# Patient Record
Sex: Male | Born: 1950 | Race: White | Hispanic: No | Marital: Single | Smoking: Former smoker
Health system: Southern US, Community
[De-identification: ages and names within clinical notes are randomized; demographics above are authoritative.]

## PROBLEM LIST (undated history)

## (undated) DIAGNOSIS — T783XXA Angioneurotic edema, initial encounter: Secondary | ICD-10-CM

## (undated) DIAGNOSIS — IMO0001 Reserved for inherently not codable concepts without codable children: Secondary | ICD-10-CM

## (undated) DIAGNOSIS — N179 Acute kidney failure, unspecified: Secondary | ICD-10-CM

## (undated) DIAGNOSIS — A419 Sepsis, unspecified organism: Secondary | ICD-10-CM

## (undated) DIAGNOSIS — T8859XA Other complications of anesthesia, initial encounter: Secondary | ICD-10-CM

## (undated) DIAGNOSIS — R202 Paresthesia of skin: Secondary | ICD-10-CM

## (undated) DIAGNOSIS — N400 Enlarged prostate without lower urinary tract symptoms: Secondary | ICD-10-CM

## (undated) DIAGNOSIS — Z973 Presence of spectacles and contact lenses: Secondary | ICD-10-CM

## (undated) DIAGNOSIS — M1990 Unspecified osteoarthritis, unspecified site: Secondary | ICD-10-CM

## (undated) DIAGNOSIS — I2699 Other pulmonary embolism without acute cor pulmonale: Secondary | ICD-10-CM

## (undated) DIAGNOSIS — N493 Fournier gangrene: Secondary | ICD-10-CM

## (undated) DIAGNOSIS — G8220 Paraplegia, unspecified: Secondary | ICD-10-CM

## (undated) DIAGNOSIS — E782 Mixed hyperlipidemia: Secondary | ICD-10-CM

## (undated) DIAGNOSIS — I272 Pulmonary hypertension, unspecified: Secondary | ICD-10-CM

## (undated) DIAGNOSIS — R6521 Severe sepsis with septic shock: Secondary | ICD-10-CM

## (undated) DIAGNOSIS — I10 Essential (primary) hypertension: Secondary | ICD-10-CM

## (undated) DIAGNOSIS — M47816 Spondylosis without myelopathy or radiculopathy, lumbar region: Secondary | ICD-10-CM

## (undated) DIAGNOSIS — E119 Type 2 diabetes mellitus without complications: Secondary | ICD-10-CM

## (undated) DIAGNOSIS — J449 Chronic obstructive pulmonary disease, unspecified: Secondary | ICD-10-CM

## (undated) DIAGNOSIS — R200 Anesthesia of skin: Secondary | ICD-10-CM

## (undated) DIAGNOSIS — J069 Acute upper respiratory infection, unspecified: Secondary | ICD-10-CM

## (undated) DIAGNOSIS — I5189 Other ill-defined heart diseases: Secondary | ICD-10-CM

## (undated) DIAGNOSIS — F419 Anxiety disorder, unspecified: Secondary | ICD-10-CM

## (undated) DIAGNOSIS — J9600 Acute respiratory failure, unspecified whether with hypoxia or hypercapnia: Secondary | ICD-10-CM

## (undated) DIAGNOSIS — M51369 Other intervertebral disc degeneration, lumbar region without mention of lumbar back pain or lower extremity pain: Secondary | ICD-10-CM

## (undated) DIAGNOSIS — Z8739 Personal history of other diseases of the musculoskeletal system and connective tissue: Secondary | ICD-10-CM

## (undated) DIAGNOSIS — Z8709 Personal history of other diseases of the respiratory system: Secondary | ICD-10-CM

## (undated) DIAGNOSIS — N50812 Left testicular pain: Secondary | ICD-10-CM

## (undated) DIAGNOSIS — K219 Gastro-esophageal reflux disease without esophagitis: Secondary | ICD-10-CM

## (undated) DIAGNOSIS — T4145XA Adverse effect of unspecified anesthetic, initial encounter: Secondary | ICD-10-CM

## (undated) DIAGNOSIS — M5136 Other intervertebral disc degeneration, lumbar region: Secondary | ICD-10-CM

## (undated) HISTORY — PX: CERVICAL DISC SURGERY: SHX588

## (undated) HISTORY — PX: DENTAL SURGERY: SHX609

## (undated) HISTORY — PX: APPENDECTOMY: SHX54

## (undated) HISTORY — DX: Angioneurotic edema, initial encounter: T78.3XXA

## (undated) HISTORY — PX: BACK SURGERY: SHX140

## (undated) HISTORY — PX: COLONOSCOPY: SHX174

## (undated) HISTORY — PX: TONSILLECTOMY: SUR1361

## (undated) HISTORY — PX: ADENOIDECTOMY: SUR15

## (undated) HISTORY — PX: PLANTAR FASCIA SURGERY: SHX746

## (undated) HISTORY — PX: OTHER SURGICAL HISTORY: SHX169

## (undated) HISTORY — DX: Acute upper respiratory infection, unspecified: J06.9

---

## 1999-12-13 ENCOUNTER — Ambulatory Visit (HOSPITAL_BASED_OUTPATIENT_CLINIC_OR_DEPARTMENT_OTHER): Admission: RE | Admit: 1999-12-13 | Discharge: 1999-12-14 | Payer: Self-pay | Admitting: Orthopedic Surgery

## 2000-10-20 ENCOUNTER — Ambulatory Visit (HOSPITAL_BASED_OUTPATIENT_CLINIC_OR_DEPARTMENT_OTHER): Admission: RE | Admit: 2000-10-20 | Discharge: 2000-10-21 | Payer: Self-pay | Admitting: Orthopedic Surgery

## 2004-08-10 ENCOUNTER — Ambulatory Visit: Payer: Self-pay | Admitting: Internal Medicine

## 2004-09-12 HISTORY — PX: CARDIAC CATHETERIZATION: SHX172

## 2004-11-08 ENCOUNTER — Ambulatory Visit: Payer: Self-pay | Admitting: Internal Medicine

## 2005-05-24 ENCOUNTER — Ambulatory Visit: Payer: Self-pay | Admitting: Cardiology

## 2005-05-31 ENCOUNTER — Ambulatory Visit: Payer: Self-pay | Admitting: Cardiology

## 2005-06-06 ENCOUNTER — Inpatient Hospital Stay (HOSPITAL_BASED_OUTPATIENT_CLINIC_OR_DEPARTMENT_OTHER): Admission: RE | Admit: 2005-06-06 | Discharge: 2005-06-06 | Payer: Self-pay | Admitting: Cardiology

## 2005-06-06 ENCOUNTER — Ambulatory Visit: Payer: Self-pay | Admitting: Cardiology

## 2005-07-12 ENCOUNTER — Ambulatory Visit: Payer: Self-pay | Admitting: Cardiology

## 2009-10-20 ENCOUNTER — Ambulatory Visit (HOSPITAL_COMMUNITY): Admission: RE | Admit: 2009-10-20 | Discharge: 2009-10-20 | Payer: Self-pay | Admitting: Neurosurgery

## 2009-11-10 ENCOUNTER — Inpatient Hospital Stay (HOSPITAL_COMMUNITY): Admission: RE | Admit: 2009-11-10 | Discharge: 2009-11-13 | Payer: Self-pay | Admitting: Neurosurgery

## 2010-02-09 ENCOUNTER — Inpatient Hospital Stay (HOSPITAL_COMMUNITY): Admission: RE | Admit: 2010-02-09 | Discharge: 2010-02-16 | Payer: Self-pay | Admitting: Neurosurgery

## 2010-02-15 ENCOUNTER — Ambulatory Visit: Payer: Self-pay | Admitting: Physical Medicine & Rehabilitation

## 2010-11-29 LAB — CBC
HCT: 43.4 % (ref 39.0–52.0)
HCT: 45.4 % (ref 39.0–52.0)
MCHC: 34.9 g/dL (ref 30.0–36.0)
MCV: 88 fL (ref 78.0–100.0)
Platelets: 245 10*3/uL (ref 150–400)
RDW: 14.4 % (ref 11.5–15.5)
WBC: 11.2 10*3/uL — ABNORMAL HIGH (ref 4.0–10.5)

## 2010-11-29 LAB — COMPREHENSIVE METABOLIC PANEL
ALT: 21 U/L (ref 0–53)
AST: 20 U/L (ref 0–37)
Alkaline Phosphatase: 96 U/L (ref 39–117)
Calcium: 9.4 mg/dL (ref 8.4–10.5)
Chloride: 97 mEq/L (ref 96–112)
Creatinine, Ser: 1.2 mg/dL (ref 0.4–1.5)
Potassium: 3.7 mEq/L (ref 3.5–5.1)
Sodium: 134 mEq/L — ABNORMAL LOW (ref 135–145)
Total Bilirubin: 0.7 mg/dL (ref 0.3–1.2)
Total Protein: 6.9 g/dL (ref 6.0–8.3)

## 2010-11-29 LAB — URINALYSIS, ROUTINE W REFLEX MICROSCOPIC
Bilirubin Urine: NEGATIVE
Hgb urine dipstick: NEGATIVE
Ketones, ur: NEGATIVE mg/dL
Nitrite: NEGATIVE
Protein, ur: NEGATIVE mg/dL

## 2010-11-29 LAB — DIFFERENTIAL
Basophils Absolute: 0.1 10*3/uL (ref 0.0–0.1)
Basophils Relative: 1 % (ref 0–1)
Eosinophils Relative: 1 % (ref 0–5)
Lymphs Abs: 3 10*3/uL (ref 0.7–4.0)
Neutrophils Relative %: 74 % (ref 43–77)

## 2010-11-29 LAB — TYPE AND SCREEN: Antibody Screen: NEGATIVE

## 2010-11-29 LAB — APTT: aPTT: 30 seconds (ref 24–37)

## 2010-12-03 LAB — COMPREHENSIVE METABOLIC PANEL
Alkaline Phosphatase: 94 U/L (ref 39–117)
BUN: 12 mg/dL (ref 6–23)
CO2: 25 mEq/L (ref 19–32)
Calcium: 9.7 mg/dL (ref 8.4–10.5)
Chloride: 95 mEq/L — ABNORMAL LOW (ref 96–112)
Creatinine, Ser: 0.96 mg/dL (ref 0.4–1.5)
Potassium: 3.7 mEq/L (ref 3.5–5.1)
Sodium: 132 mEq/L — ABNORMAL LOW (ref 135–145)
Total Protein: 6.6 g/dL (ref 6.0–8.3)

## 2010-12-03 LAB — URINE MICROSCOPIC-ADD ON

## 2010-12-03 LAB — DIFFERENTIAL
Basophils Relative: 0 % (ref 0–1)
Eosinophils Absolute: 0.2 10*3/uL (ref 0.0–0.7)
Eosinophils Relative: 1 % (ref 0–5)
Eosinophils Relative: 2 % (ref 0–5)
Lymphocytes Relative: 15 % (ref 12–46)
Lymphs Abs: 2.7 10*3/uL (ref 0.7–4.0)
Monocytes Relative: 6 % (ref 3–12)
Neutro Abs: 9.7 10*3/uL — ABNORMAL HIGH (ref 1.7–7.7)
Neutrophils Relative %: 72 % (ref 43–77)

## 2010-12-03 LAB — BASIC METABOLIC PANEL
BUN: 16 mg/dL (ref 6–23)
Calcium: 9.3 mg/dL (ref 8.4–10.5)
Chloride: 96 mEq/L (ref 96–112)
Creatinine, Ser: 0.92 mg/dL (ref 0.4–1.5)
GFR calc Af Amer: >60 mL/min (ref >60)
GFR calc non Af Amer: >60 mL/min (ref >60)
Glucose, Bld: 111 mg/dL — ABNORMAL HIGH (ref 70–99)
Sodium: 132 mEq/L — ABNORMAL LOW (ref 135–145)

## 2010-12-03 LAB — URINALYSIS, ROUTINE W REFLEX MICROSCOPIC
Bilirubin Urine: NEGATIVE
Glucose, UA: NEGATIVE mg/dL
Hgb urine dipstick: NEGATIVE
Nitrite: NEGATIVE
Protein, ur: NEGATIVE mg/dL
Specific Gravity, Urine: 1.019 (ref 1.005–1.030)
pH: 6.5 (ref 5.0–8.0)
pH: 7 (ref 5.0–8.0)

## 2010-12-03 LAB — CBC
HCT: 39.8 % (ref 39.0–52.0)
Hemoglobin: 14 g/dL (ref 13.0–17.0)
Hemoglobin: 14.8 g/dL (ref 13.0–17.0)
MCHC: 34.9 g/dL (ref 30.0–36.0)
MCHC: 35.2 g/dL (ref 30.0–36.0)
Platelets: 247 10*3/uL (ref 150–400)
RDW: 14.4 % (ref 11.5–15.5)
RDW: 14.5 % (ref 11.5–15.5)
RDW: 14.6 % (ref 11.5–15.5)
WBC: 15 10*3/uL — ABNORMAL HIGH (ref 4.0–10.5)

## 2010-12-03 LAB — PROTIME-INR
INR: 0.97 (ref 0.00–1.49)
Prothrombin Time: 12.8 seconds (ref 11.6–15.2)
Prothrombin Time: 13.2 seconds (ref 11.6–15.2)

## 2010-12-06 LAB — CBC
Hemoglobin: 14.2 g/dL (ref 13.0–17.0)
MCHC: 35.1 g/dL (ref 30.0–36.0)
RBC: 4.53 MIL/uL (ref 4.22–5.81)

## 2010-12-06 LAB — GLUCOSE, CAPILLARY: Glucose-Capillary: 160 mg/dL — ABNORMAL HIGH (ref 70–99)

## 2011-01-28 NOTE — Op Note (Signed)
[place]  Patient:    Patrick Aguilar, Patrick Aguilar                        MRN: [id number] Adm. Date:  [id number] Disc. Date: [id number] Attending:  Milly Jakob CC:         Harvie Junior, M.D.                           Operative Report  AGE:  60  DATE OF BIRTH: [birth year]  PREOPERATIVE DIAGNOSIS: Subacromial impingement with possible rotator cuff tear.  POSTOPERATIVE DIAGNOSES: 1. Subacromial impingement with type 2 acromion. 2. Full-thickness rotator cuff tear involving all the supraspinatus and a portion    of the infraspinatus. 3. Posterior labral tear.  PRINCIPAL PROCEDURES: 1. Shoulder arthroscopy with debridement of posterior labral tear and undersurface    rotator cuff tear. 2. Arthroscopic subacromial decompression. 3. Mini open rotator cuff repair.  SURGEON:  Harvie Junior, M.D.  ASSISTANT:  Kerby Less, P.A.  ANESTHESIA:  General.  BRIEF HISTORY: This is a 60 year old male with a long history of having shoulder pain.  He was evaluated in the office and felt to have significant impingement ith a type 2 acromion.  He underwent prolonged conservative care and because of failure of this, returned to the office.  At that time, he was noted to have weakness greater than his initial level of weakness, and was very much concerned at that  point there could be a rotator cuff tear.  The patient was doing poorly and he elt at that point that he would like to undergo surgical evaluation so he is brought to the operating room for this procedure.  DESCRIPTION OF PROCEDURE:  The patient was taken to the operating room and adequate anesthesia was obtained with general anesthesia.  The patient was place supine on the operating table.  He was then placed into the beach-chair position and the right shoulder was then prepped and draped in the usual sterile fashion. Care was taken to pad all bony prominences.  Following this,  routine arthroscopic examination of the shoulder revealed there was an obvious flap tear of the undersurface of the rotator cuff.  This was debrided with a suction shaver.  It was clear at that point there was either high grade partial-thickness tear or full-thickness tear of the majority of the supraspinatus. He was noted at this time to have a posterior labral tear. This was debrided and attention was then turned to the subacromial space.  At this point, anterolateral acromioplasty was performed from a lateral portal ith a 6 bur.  Once this was achieved, attention was turned laterally where there was noted to be full-thickness rotator cuff tear.  The edges of this were debrided ith a suction shaver.  The bur was put in and the tuberosity was burred up. At that  point, the size of the cuff necessitated a mini open rotator cuff repair.  At this point, the arthroscopic cannula was removed from the shoulder.  An incision was made incorporating the lateral portal.  The lateral portal was then opened nd the subcutaneous tissues reflected down to the level of the deltoid muscle. The deltoid muscle was then divided in the middle between the anterior and middle portions of the deltoid and the raphe.  This was taken up onto bone.   The deltoid was then opened and the underlying rotator  cuff tear was identified.  A bursectomy was performed and the tuberosity was then burred as well as the rotator cuff being rongeured.  There was interestingly a tremendous amount of scar tissue on the undersurface of the rotator cuff.  This had to be debrided with a knife.  This ent back to bleeding viable tendon.  At this point, three suture anchors were put in place and the cuff was then tied down.  No tension was noted on the cuff at this point.  The posterior portion extension of the cuff side-to-side repair was undertaken and anteriorly at the biceps groove a repair was undertaken.  At this  point with the sutures in place the arm was put through a full range of motion and there were no problems. The acromioplasty, which had been performed arthroscopically was then evaluated and  felt to be perfect.  At this point, there were some undersurface palpable prominence of the rotator cuff; however, the patient had no preoperative symptoms of distal clavicle problems; and, it was felt this was okay.  The arm having been put through a range of motion had no impingement in this area.  At this point, the area was copiously irrigated and suctioned dry.  The deltoid was allowed to be closed with an 0-Vicryl interrupted suture, the subcutaneous tissue, and with 0-Vicryl and 2-0 Vicryl, and the skin with 3-0 Prolene pull-out suture.  A sterile compressive dressing was then applied and the patient was placed into an arm sling.  He was then taken to the recovery room when he was noted to be in satisfactory condition. Estimated blood loss for the procedure was minimal. DD:  12/13/99 TD:  12/13/99 Job: [id number]

## 2011-01-28 NOTE — Op Note (Signed)
[place]  Patient:    IDRISSA, BEVILLE                        MRN: [id number] Proc. Date: 10/20/00 Adm. Date:  [id number] Attending:  Milly Jakob                           Operative Report  PREOPERATIVE DIAGNOSIS:   Type 2 acromion with impingement and probable rotator cuff tear.  POSTOPERATIVE DIAGNOSES: 1. Rotator cuff tear. 2. Type 2 acromion with anterior and lateral impingement. 3. Distal clavicle impingement of the rotator cuff.  OPERATION: 1. Mini open rotator cuff repair. 2. Arthroscopic subacromial decompression with debridement of rotator    cuff and glenohumeral fray. 3. Partial distal clavicle excision by way of coplaning the clavicle.  SURGEON:  Harvie Junior, M.D.  ASSISTANT:  Currie Paris. Thedore Mins.  ANESTHESIA:  Nonblock with general.  INDICATIONS:  This is a 60 year old male with a long history of having had a right rotator cuff tear.  We ultimately evaluated him and felt that he had a rotator cuff tear.  He was taken to the operating room for fixation of this. He began having similar complaints in his left shoulder and because of continued complaints of pain ultimately came in for evaluation and anesthesia, arthroscopy and subacromial decompression and open rotator cuff repair as needed.  DESCRIPTION OF PROCEDURE:  The patient was taken to the operating room and after the adequate anesthesia was obtained with general, the patient was placed supine on the operating table.  He was then moved into the beach chair position.  All bony prominences were well-padded.  The left shoulder was then prepped and draped in the usual sterile fashion and following this routine arthroscopic examination of the shoulder revealed their was some fraying in the glenohumeral joint, not bad and no significant evidence of arthritis. Attention was turned out to the rotator cuff and from inside the joint it was clear that there was a rotator  cuff tear in the supraspinatus tendon only. At this point, attention was pulled out of the glenohumeral joint and attention turned to the subacromial space where a bursectomy was performed and debridement of the undersurface of the clavicle and acromion, subacromial decompression was performed at this point as well as a coplaning of the distal clavicle.  Attention was then turned to the mini open procedure where an incision was made over the lateral aspect of the shoulder. Subcutaneous tissues were dissected down to the level of the deltoid.  A raphe was entered between the anterior and middle deltoid.  This was taken up onto the acromion and the flaps were raised.  The torn rotator cuff was easily identified at that point.  The bone was roughened at the lateral surface.  The tendon was freshened up.  The tendon was pulled over and two suture anchors were used to anchor the tendon back down.  The two sutures on each anchor, one was tied in a figure-of-eight fashion and the other was tied side-by-side.  At this position, the wound was copiously irrigated and suctioned dry. The deltoid was then closed with a combination of single suture through bone anchoring the deltoid back up to the anterior aspect of the acromion and then a #1 Vicryl running suture to repair the deltoid.  At that point, the skin was closed with a combination of 2-0 Vicryl and a  4-0 Monocryl suture.  Benzoin and Steri-Strips were applied and a sterile compressive dressing.  The patient was placed into an arm sling and was taken to the recovery room where she was noted to be in satisfactory condition.  Estimated blood loss for the suture was negligible.DD:  10/20/00 TD:  10/21/00 Job: [id number]

## 2011-01-28 NOTE — Cardiovascular Report (Signed)
NAME:  Patrick Aguilar, Patrick Aguilar NO.:  [id number]   MEDICAL RECORD NO.:  [id number]          PATIENT TYPE:  OIB   LOCATION:  1963                         FACILITY:  [facility]   PHYSICIAN:  Jonelle Sidle, M.D. LHCDATE OF BIRTH:  [birth year]   DATE OF PROCEDURE:  06/06/2005  DATE OF DISCHARGE:  06/06/2005                              CARDIAC CATHETERIZATION   PRIMARY CARE PHYSICIAN:  Dr. Nena Jordan.   PRIMARY CARDIOLOGIST:  Dr. Willa Rough.   INDICATIONS:  Patrick Aguilar is a 60 year old male with a reported history of  hyperlipidemia, borderline type 2 diabetes mellitus, family history of  cardiovascular disease, chronic lower back pain, and apparent chronic  obstructive pulmonary disease with history of significant tobacco use. He  has had dyspnea on exertion and a recent myocardial perfusion study which  was reported as negative for ischemia. Given persistent symptoms, the  patient is referred for definitive diagnostic coronary angiography to  clearly assess the coronary anatomy and evaluate for any potential  revascularizations strategies. Right heart catheterization was also  performed to exclude the possibility of significant underlying pulmonary  hypertension. Informed consent was obtained and signed.   PROCEDURES PERFORMED:  1.  Left heart catheterization.  2.  Right heart catheterization.  3.  Selective coronary angiography.  4.  Left ventriculography.   ACCESS AND EQUIPMENT:  The area about the right femoral artery and vein was  anesthetized with 1% lidocaine. A 7-French sheath was placed in the right  femoral vein via the modified Seldinger technique. A 4-French sheath was  placed in the right femoral artery via the modified Seldinger technique.  Standard preformed 4-French JL-4 and JR-4 catheters used for selective  coronary angiography and a 4-French angled pigtail catheter was used for  left heart catheterization and left ventriculography. A 7-French  balloon-  tipped flow-directed catheter was used for right heart catheterization and  hemodynamic assessment. All exchanges were made over wire with the exception  the right heart catheter. The patient tolerated the procedure well without  immediate complications.   HEMODYNAMIC RESULTS:  Right atrium mean of 6, right ventricle 26/4,  pulmonary artery 25/11. Pulmonary capillary wedge pressure mean of 9. Left  ventricle 140/15. Aorta 144/72. Cardiac output 4.5 by the Fick method.  Cardiac index at 2.3 by the Fick method. Arterial saturation 94% on room  air. Pulmonary artery saturation 68% on room air.   ANGIOGRAPHIC FINDINGS:  1.  The left main coronary artery is free of significant flow-limiting      coronary atherosclerosis.  2.  The left anterior descending is a medium caliber vessel with three      diagonal branches; the first two of which are the largest and bifurcate      followed by very small third branch. No significant flow-limiting      coronary atherosclerosis is noted within this system.  3.  There is a very large ramus intermedius branch that trifurcates. No      significant flow-limiting coronary atherosclerosis is noted within this      system.  4.  The circumflex coronary artery is medium  in caliber with three very      small obtuse marginal branches and a large right atrial branch. No      significant flow-limiting coronary sclerosis noted within this system.  5.  The right coronary artery is a large dominant vessel with large      posterior descending and posterolateral branches. No significant flow-      limiting coronary atherosclerosis noted within this system. Three right      ventricular marginal branches are noted.   Left ventriculography was performed in the RAO projection. It revealed an  ejection fraction of approximately 50-55% with no significant mitral  regurgitation.   DIAGNOSES:  1.  No obstructive coronary atherosclerosis noted within the major       epicardial vessels as discussed above.  2.  Essentially normal pulmonary artery pressures, normal pulmonary      capillary wedge pressure and normal cardiac output.  3.  Left ventricular ejection fraction approximately 50-55% with no      significant mitral regurgitation and left ventricular end-diastolic      pressure of 15 mmHg.   I reviewed the results with the patient as well as his fiancee. Results were  also forwarded to our [place] office. We will anticipate continued  medical therapy and follow-up with evaluation by Dr. Raul Del as planned. We  will also see the patient back in our office for groin check in  approximately one week.           ______________________________  Jonelle Sidle, M.D. [facility]     SGM/MEDQ  D:  06/06/2005  T:  06/06/2005  Job:  [id number]   cc:   Nena Jordan  [address]  [place]  [place] [address]   Willa Rough, M.D.  [address]  [address]  [place]  [place] [address]

## 2011-02-18 IMAGING — CT CT L SPINE W/O CM
3 of 6 series · 14 of 33 positions shown, 17 images · non-contrast
Comparison: Intraoperative films 02/09/2010.

CLINICAL DATA: Lumbar spondylosis.  Unable to lift left leg.
Severe low back pain.  Left hip and leg pain.  Status post
interbody fusion 02/09/2010.

CT LUMBAR SPINE WITHOUT CONTRAST
TECHNIQUE: Multidetector CT imaging of the lumbar spine was
performed without intravenous contrast administration. Multiplanar
CT image reconstructions were also generated.

[Series 4: 2mm axial soft tissue · axial · 0.43mm/px · z∈[-353,-173]mm · 8 of 112 slices shown, 10 images]
[im 11/112  soft-tissue]
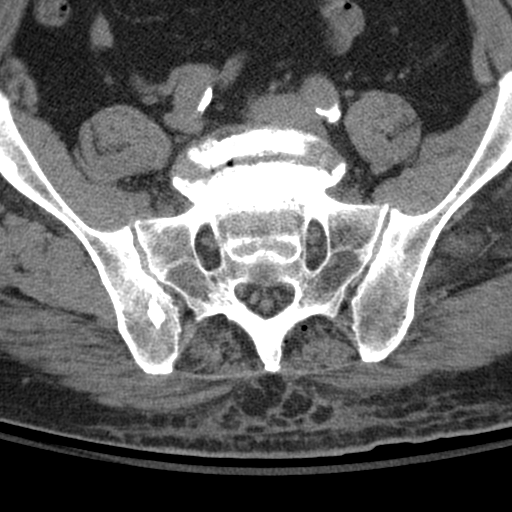
[im 11/112  bone]
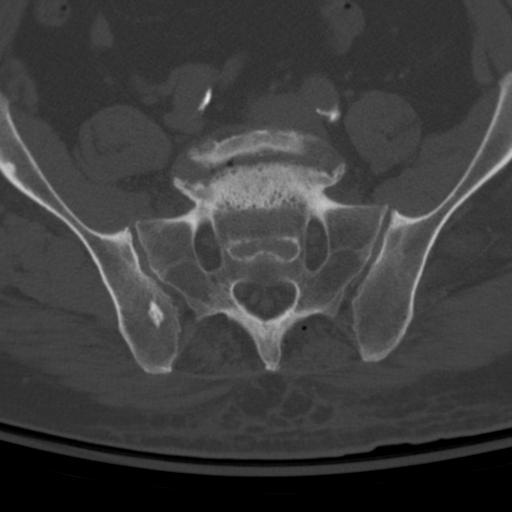
[im 21/112  bone]
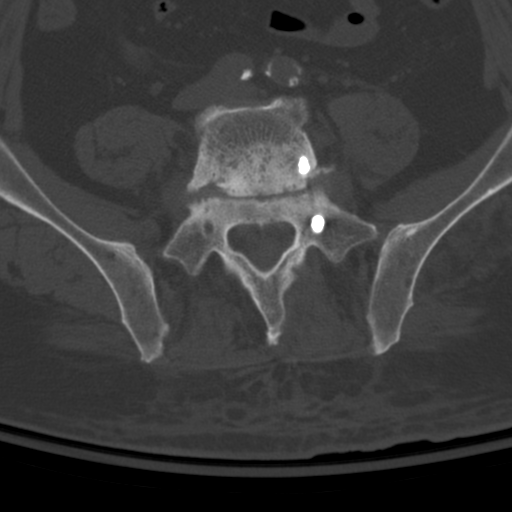
[im 41/112  bone]
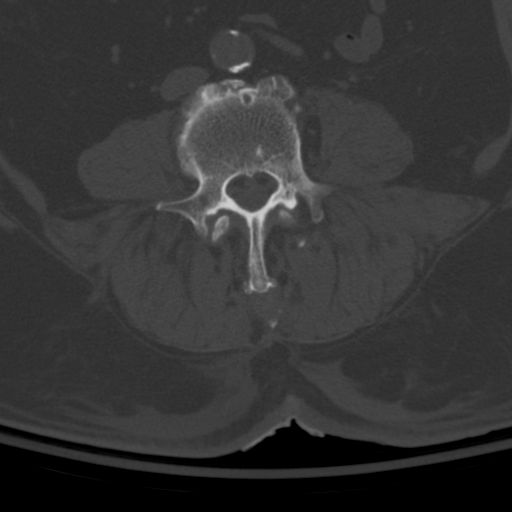
[im 51/112  bone]
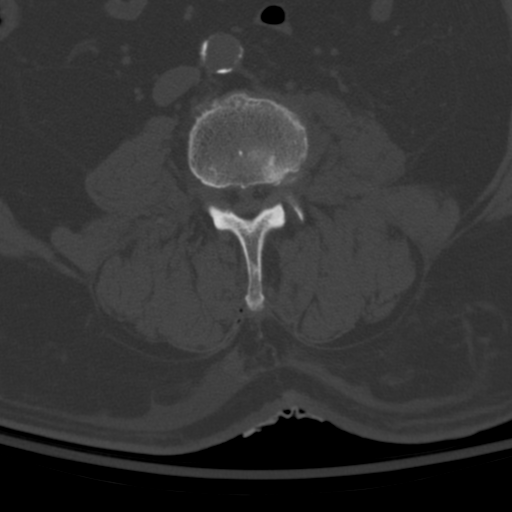
[im 61/112  soft-tissue]
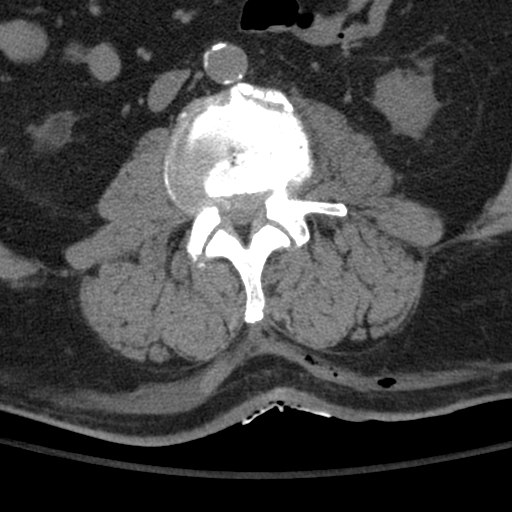
[im 61/112  bone]
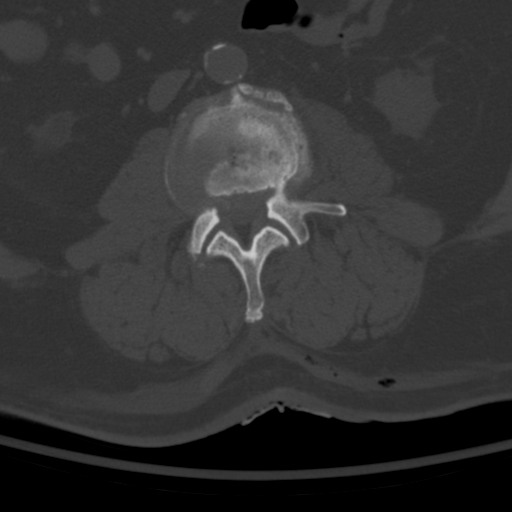
[im 71/112  bone]
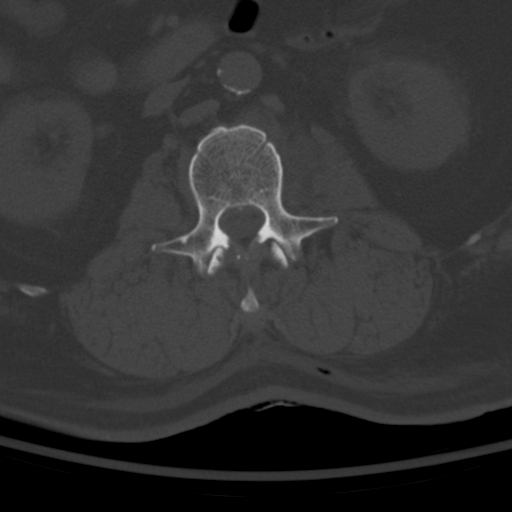
[im 91/112  bone]
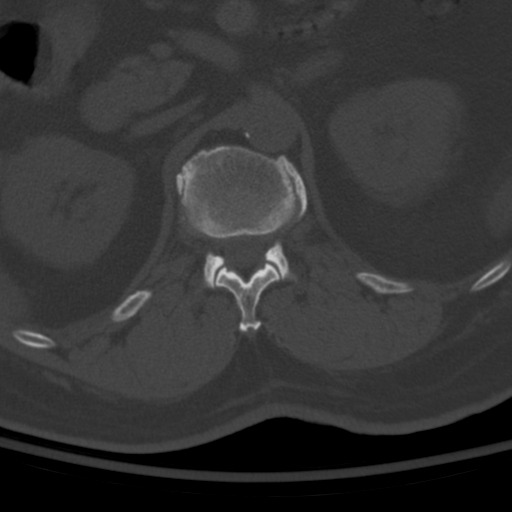
[im 101/112  bone]
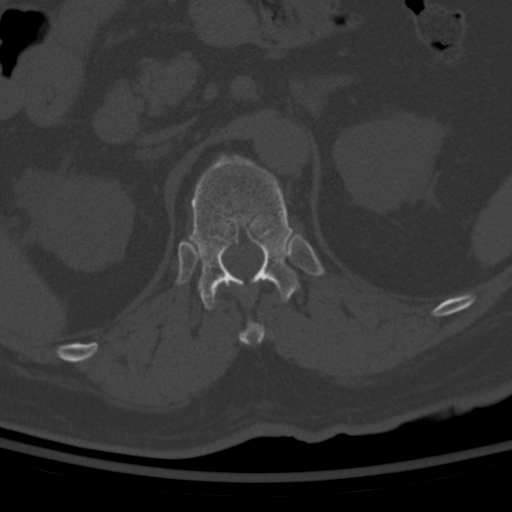

[Series 604: lower bone coronals · coronal · 0.44mm/px · 1 of 66 slices shown]
[im 3/66  bone]
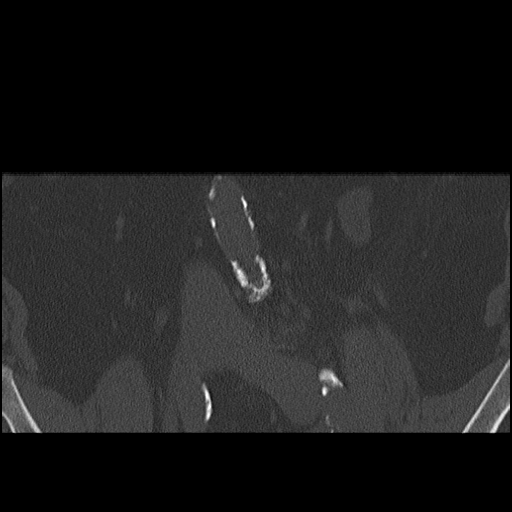

[Series 606: <mpr thick range> · sagittal · 0.44mm/px · 5 of 54 slices shown, 6 images]
[im 18/54  bone]
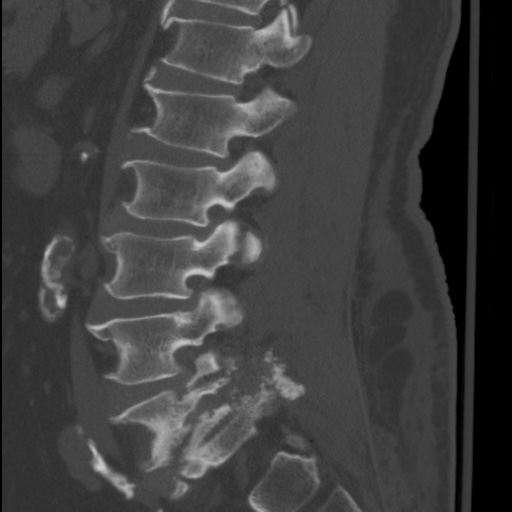
[im 23/54  bone]
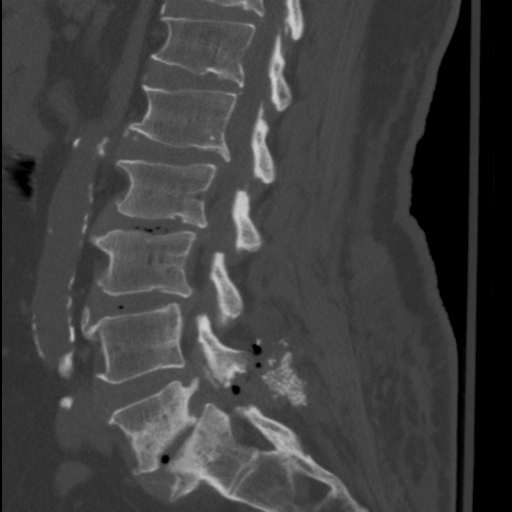
[im 27/54  soft-tissue]
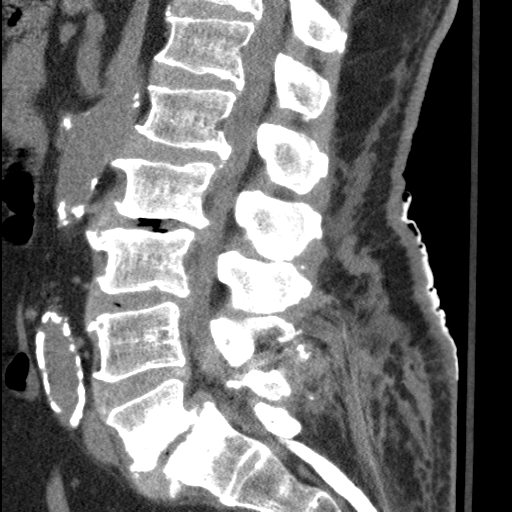
[im 27/54  bone]
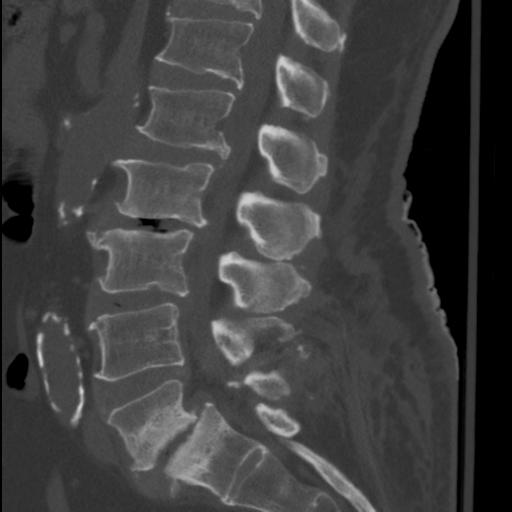
[im 31/54  bone]
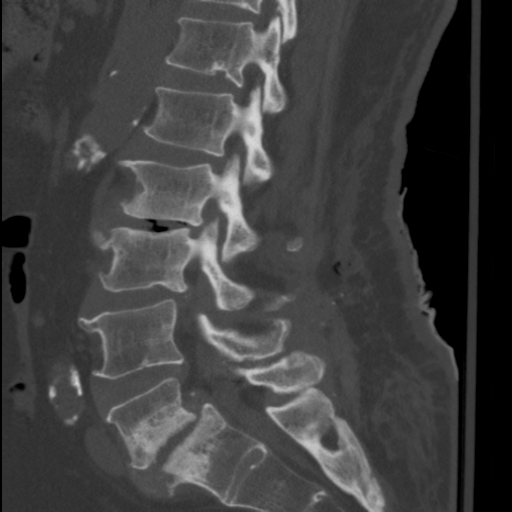
[im 36/54  bone]
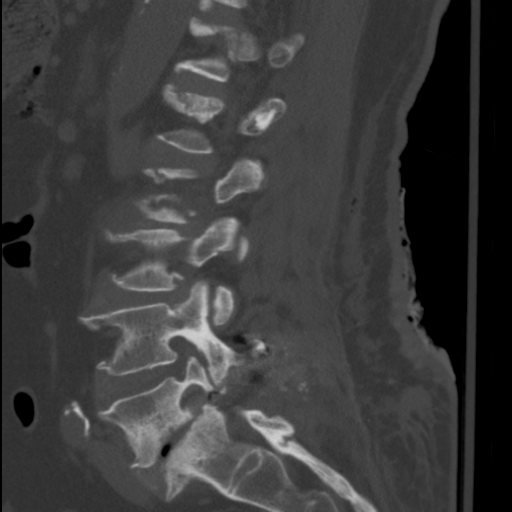

[14 of 33 positions shown; findings below may reference images not displayed]

FINDINGS: The lumbar spine is imaged from the T11-12 disc through
S2.  Mild degenerative retrolisthesis is present at L1-2, L2-3, and
L3-4.  The retrolisthesis at L4-5 measures 3 mm.  Anterolisthesis
at L5-S1 measures 7.5 mm.  Bilateral pars defects are noted.  The
patient is status post posterior fusion.  There are tracks from
screws which were removed on the right.  The left L5 pedicle exits
the bony inferiorly and medially and traverses the left foramen.
This potentially impacts the exiting L5 nerve root.  Bilateral L5
laminotomies were performed.  Graft material is seen posteriorly.

The individual disc levels are as follows.

T12-L1:  Slight left disc bulging is present.  Mild facet
hypertrophy is seen no significant stenosis is evident.

L1-2:  Leftward disc bulging is present.  Posterior osteophyte
formation from the L1 vertebral body is also present.  This leads
to mild moderate central canal stenosis, worse on the left.  Mild
to moderate left and mild right foraminal stenosis is present.

L2-3:  A broad-based disc bulge is present.  Osteophyte formation
is seen along the inferior endplate of L2.  A vacuum phenomenon is
present in the disc.  Moderate central canal stenosis is evident
with left greater than right lateral recess narrowing.  Moderate
foraminal stenosis is seen bilaterally.

L3-4:  A broad-based disc bulge is present.  Moderate facet
hypertrophy is seen.  Moderate central canal stenosis is seen.
Moderate foraminal stenosis is worse on the left.

L4-5:  A broad-based disc bulge is present.  Facet hypertrophy is
worse on the left.  Prominent epidural fat is noted.  Moderate
foraminal stenosis is seen bilaterally, worse on the right.

L5 S1:  Anterolisthesis is as noted above.  Gas within the soft
tissues is compatible with recent surgery.  Prominent epidural fat
is noted.  There is uncovering of the disc.  Moderate foraminal
stenosis due to disc uncovering is worse on the right.
IMPRESSION: 1.  Status post PLIF at L5-S1.
2.  The left L5 pedicle traverses the left L5 S1 foramen and may
impact the left L5 nerve root as it exits.
3.  The right-sided screws were placed and then removed, leaving
tracks within the bone.
4.  Persistent anterolisthesis at L5-S1 with uncovering of the disc
leading to a moderate foraminal stenosis bilaterally.
5.  Multilevel spondylosis throughout the remainder of the lumbar
spine as described.

Critical test results telephoned to Dr. Ashuk at the time of

## 2015-02-26 ENCOUNTER — Other Ambulatory Visit (HOSPITAL_COMMUNITY): Payer: Self-pay | Admitting: Otolaryngology

## 2015-03-04 ENCOUNTER — Encounter (HOSPITAL_COMMUNITY)
Admission: RE | Admit: 2015-03-04 | Discharge: 2015-03-04 | Disposition: A | Discharge status: Home / Self Care | Payer: Medicare Other | Source: Ambulatory Visit | Attending: Otolaryngology | Admitting: Otolaryngology

## 2015-03-04 ENCOUNTER — Encounter (HOSPITAL_COMMUNITY): Payer: Self-pay

## 2015-03-04 DIAGNOSIS — K115 Sialolithiasis: Secondary | ICD-10-CM | POA: Diagnosis not present

## 2015-03-04 DIAGNOSIS — Z01812 Encounter for preprocedural laboratory examination: Secondary | ICD-10-CM | POA: Diagnosis present

## 2015-03-04 DIAGNOSIS — I4510 Unspecified right bundle-branch block: Secondary | ICD-10-CM | POA: Insufficient documentation

## 2015-03-04 HISTORY — DX: Unspecified osteoarthritis, unspecified site: M19.90

## 2015-03-04 HISTORY — DX: Gastro-esophageal reflux disease without esophagitis: K21.9

## 2015-03-04 HISTORY — DX: Chronic obstructive pulmonary disease, unspecified: J44.9

## 2015-03-04 HISTORY — DX: Anxiety disorder, unspecified: F41.9

## 2015-03-04 HISTORY — DX: Reserved for inherently not codable concepts without codable children: IMO0001

## 2015-03-04 LAB — CBC
HEMATOCRIT: 39.5 % (ref 39.0–52.0)
Hemoglobin: 13.7 g/dL (ref 13.0–17.0)
MCH: 30 pg (ref 26.0–34.0)
MCHC: 34.7 g/dL (ref 30.0–36.0)
MCV: 86.6 fL (ref 78.0–100.0)
PLATELETS: 198 10*3/uL (ref 150–400)
RBC: 4.56 MIL/uL (ref 4.22–5.81)
RDW: 14.4 % (ref 11.5–15.5)
WBC: 9.2 10*3/uL (ref 4.0–10.5)

## 2015-03-04 LAB — BASIC METABOLIC PANEL
ANION GAP: 9 (ref 5–15)
BUN: 15 mg/dL (ref 6–20)
CO2: 27 mmol/L (ref 22–32)
CREATININE: 1.11 mg/dL (ref 0.61–1.24)
Calcium: 9.2 mg/dL (ref 8.9–10.3)
Chloride: 103 mmol/L (ref 101–111)
GFR calc Af Amer: >60 mL/min (ref >60)
GFR calc non Af Amer: >60 mL/min (ref >60)
Glucose, Bld: 91 mg/dL (ref 65–99)
Potassium: 3.4 mmol/L — ABNORMAL LOW (ref 3.5–5.1)
Sodium: 139 mmol/L (ref 135–145)

## 2015-03-04 NOTE — Pre-Procedure Instructions (Addendum)
Patrick Aguilar [facility]  03/04/2015     No Pharmacies Listed   Your procedure is scheduled on 03/12/15  Report to [facility] short stay admitting at 930 A.M.  Call this number if you have problems the morning of surgery:  [phone]   Remember:  Do not eat food or drink liquids after midnight.  Take these medicines the morning of surgery with A SIP OF WATER amlodipine, atenolol, prozac, ranitadine,dosazosin,gabapentin  Pain med if needed      STOP all herbel meds, nsaids (aleve,naproxen,advil,ibuprofen) 5 days prior to surgery starting 03/07/15 including vitamins, aspirin,meloxicam   Do not wear jewelry, make-up or nail polish.  Do not wear lotions, powders, or perfumes.  You may wear deodorant.  Do not shave 48 hours prior to surgery.  Men may shave face and neck.  Do not bring valuables to the hospital.  [facility] is not responsible for any belongings or valuables.  Contacts, dentures or bridgework may not be worn into surgery.  Leave your suitcase in the car.  After surgery it may be brought to your room.  For patients admitted to the hospital, discharge time will be determined by your treatment team.  Patients discharged the day of surgery will not be allowed to drive home.   Name and phone number of your driver:    Special instructions:   Special Instructions: [place] - Preparing for Surgery  Before surgery, you can play an important role.  Because skin is not sterile, your skin needs to be as free of germs as possible.  You can reduce the number of germs on you skin by washing with CHG (chlorahexidine gluconate) soap before surgery.  CHG is an antiseptic cleaner which kills germs and bonds with the skin to continue killing germs even after washing.  Please DO NOT use if you have an allergy to CHG or antibacterial soaps.  If your skin becomes reddened/irritated stop using the CHG and inform your nurse when you arrive at Short Stay.  Do not shave (including legs and  underarms) for at least 48 hours prior to the first CHG shower.  You may shave your face.  Please follow these instructions carefully:   1.  Shower with CHG Soap the night before surgery and the morning of Surgery.  2.  If you choose to wash your hair, wash your hair first as usual with your normal shampoo.  3.  After you shampoo, rinse your hair and body thoroughly to remove the Shampoo.  4.  Use CHG as you would any other liquid soap.  You can apply chg directly  to the skin and wash gently with scrungie or a clean washcloth.  5.  Apply the CHG Soap to your body ONLY FROM THE NECK DOWN.  Do not use on open wounds or open sores.  Avoid contact with your eyes ears, mouth and genitals (private parts).  Wash genitals (private parts)       with your normal soap.  6.  Wash thoroughly, paying special attention to the area where your surgery will be performed.  7.  Thoroughly rinse your body with warm water from the neck down.  8.  DO NOT shower/wash with your normal soap after using and rinsing off the CHG Soap.  9.  Pat yourself dry with a clean towel.            10.  Wear clean pajamas.            11.  Place clean sheets on your bed the night of your first shower and do not sleep with pets.  Day of Surgery  Do not apply any lotions/deodorants the morning of surgery.  Please wear clean clothes to the hospital/surgery center.  Please read over the following fact sheets that you were given. Pain Booklet, Coughing and Deep Breathing and Surgical Site Infection Prevention

## 2015-03-12 ENCOUNTER — Encounter (HOSPITAL_COMMUNITY): Payer: Self-pay | Admitting: *Deleted

## 2015-03-12 ENCOUNTER — Ambulatory Visit (HOSPITAL_COMMUNITY): Payer: Medicare Other | Admitting: Anesthesiology

## 2015-03-12 ENCOUNTER — Encounter (HOSPITAL_COMMUNITY)
Admission: RE | Disposition: A | Discharge status: Home / Self Care | Payer: Self-pay | Source: Ambulatory Visit | Attending: Otolaryngology

## 2015-03-12 ENCOUNTER — Observation Stay (HOSPITAL_COMMUNITY)
Admission: RE | Admit: 2015-03-12 | Discharge: 2015-03-13 | Disposition: A | Discharge status: Home / Self Care | Payer: Medicare Other | Source: Ambulatory Visit | Attending: Otolaryngology | Admitting: Otolaryngology

## 2015-03-12 DIAGNOSIS — K219 Gastro-esophageal reflux disease without esophagitis: Secondary | ICD-10-CM | POA: Insufficient documentation

## 2015-03-12 DIAGNOSIS — Z79899 Other long term (current) drug therapy: Secondary | ICD-10-CM | POA: Diagnosis not present

## 2015-03-12 DIAGNOSIS — M1990 Unspecified osteoarthritis, unspecified site: Secondary | ICD-10-CM | POA: Diagnosis not present

## 2015-03-12 DIAGNOSIS — K1120 Sialoadenitis, unspecified: Secondary | ICD-10-CM | POA: Diagnosis not present

## 2015-03-12 DIAGNOSIS — J449 Chronic obstructive pulmonary disease, unspecified: Secondary | ICD-10-CM | POA: Insufficient documentation

## 2015-03-12 DIAGNOSIS — Z885 Allergy status to narcotic agent status: Secondary | ICD-10-CM | POA: Insufficient documentation

## 2015-03-12 DIAGNOSIS — K115 Sialolithiasis: Principal | ICD-10-CM | POA: Diagnosis present

## 2015-03-12 DIAGNOSIS — F17210 Nicotine dependence, cigarettes, uncomplicated: Secondary | ICD-10-CM | POA: Insufficient documentation

## 2015-03-12 DIAGNOSIS — F419 Anxiety disorder, unspecified: Secondary | ICD-10-CM | POA: Insufficient documentation

## 2015-03-12 HISTORY — PX: SUBMANDIBULAR GLAND EXCISION: SHX2456

## 2015-03-12 SURGERY — EXCISION, SUBMANDIBULAR GLAND
Anesthesia: General | Site: Neck | Laterality: Left

## 2015-03-12 MED ORDER — ALBUTEROL SULFATE (2.5 MG/3ML) 0.083% IN NEBU: 2.5000 mg | INHALATION_SOLUTION | Freq: Four times a day (QID) | RESPIRATORY_TRACT | Status: DC | PRN

## 2015-03-12 MED ORDER — ATENOLOL-CHLORTHALIDONE 50-25 MG PO TABS
1.0000 | ORAL_TABLET | Freq: Every day | ORAL | Status: DC
  Filled 2015-03-12: qty 1

## 2015-03-12 MED ORDER — EPHEDRINE SULFATE 50 MG/ML IJ SOLN
INTRAMUSCULAR | Status: AC
  Filled 2015-03-12: qty 1

## 2015-03-12 MED ORDER — CEFAZOLIN SODIUM 1-5 GM-% IV SOLN
1.0000 g | Freq: Three times a day (TID) | INTRAVENOUS | Status: DC
  Administered 2015-03-12 – 2015-03-13 (×2): 1 g via INTRAVENOUS
  Filled 2015-03-12 (×4): qty 50

## 2015-03-12 MED ORDER — FENTANYL CITRATE (PF) 250 MCG/5ML IJ SOLN
INTRAMUSCULAR | Status: AC
  Filled 2015-03-12: qty 5

## 2015-03-12 MED ORDER — ACETAMINOPHEN 325 MG PO TABS: 650.0000 mg | ORAL_TABLET | ORAL | Status: DC | PRN

## 2015-03-12 MED ORDER — KCL IN DEXTROSE-NACL 20-5-0.45 MEQ/L-%-% IV SOLN
INTRAVENOUS | Status: DC
Start: 2015-03-12 — End: 2015-03-13
  Administered 2015-03-12 – 2015-03-13 (×2): via INTRAVENOUS
  Filled 2015-03-12 (×2): qty 1000

## 2015-03-12 MED ORDER — SUCCINYLCHOLINE CHLORIDE 20 MG/ML IJ SOLN
INTRAMUSCULAR | Status: DC | PRN
  Administered 2015-03-12: 100 mg via INTRAVENOUS

## 2015-03-12 MED ORDER — LACTATED RINGERS IV SOLN
INTRAVENOUS | Status: DC
  Administered 2015-03-12 (×3): via INTRAVENOUS

## 2015-03-12 MED ORDER — POTASSIUM CHLORIDE CRYS ER 20 MEQ PO TBCR
40.0000 meq | EXTENDED_RELEASE_TABLET | Freq: Every day | ORAL | Status: DC
  Administered 2015-03-13: 40 meq via ORAL
  Filled 2015-03-12: qty 2

## 2015-03-12 MED ORDER — BENAZEPRIL HCL 20 MG PO TABS
20.0000 mg | ORAL_TABLET | Freq: Every day | ORAL | Status: DC
  Administered 2015-03-13: 20 mg via ORAL
  Filled 2015-03-12 (×2): qty 1

## 2015-03-12 MED ORDER — GABAPENTIN 400 MG PO CAPS
800.0000 mg | ORAL_CAPSULE | Freq: Three times a day (TID) | ORAL | Status: DC
  Administered 2015-03-12 – 2015-03-13 (×2): 800 mg via ORAL
  Filled 2015-03-12 (×2): qty 2

## 2015-03-12 MED ORDER — PROMETHAZINE HCL 25 MG RE SUPP: 12.5000 mg | Freq: Four times a day (QID) | RECTAL | Status: DC | PRN

## 2015-03-12 MED ORDER — GABAPENTIN 800 MG PO TABS
800.0000 mg | ORAL_TABLET | Freq: Three times a day (TID) | ORAL | Status: DC
  Filled 2015-03-12 (×2): qty 1

## 2015-03-12 MED ORDER — ARTIFICIAL TEARS OP OINT
TOPICAL_OINTMENT | OPHTHALMIC | Status: AC
  Filled 2015-03-12: qty 3.5

## 2015-03-12 MED ORDER — HYDROCODONE-ACETAMINOPHEN 5-325 MG PO TABS
1.0000 | ORAL_TABLET | ORAL | Status: DC | PRN
  Administered 2015-03-12: 1 via ORAL
  Filled 2015-03-12: qty 2

## 2015-03-12 MED ORDER — CEFAZOLIN SODIUM-DEXTROSE 2-3 GM-% IV SOLR
INTRAVENOUS | Status: DC | PRN
  Administered 2015-03-12: 2 g via INTRAVENOUS

## 2015-03-12 MED ORDER — DOXAZOSIN MESYLATE 8 MG PO TABS
8.0000 mg | ORAL_TABLET | Freq: Every day | ORAL | Status: DC
  Administered 2015-03-13: 8 mg via ORAL
  Filled 2015-03-12 (×2): qty 1

## 2015-03-12 MED ORDER — ROCURONIUM BROMIDE 50 MG/5ML IV SOLN
INTRAVENOUS | Status: AC
  Filled 2015-03-12: qty 1

## 2015-03-12 MED ORDER — FAMOTIDINE 20 MG PO TABS
20.0000 mg | ORAL_TABLET | Freq: Two times a day (BID) | ORAL | Status: DC
  Administered 2015-03-12 – 2015-03-13 (×2): 20 mg via ORAL
  Filled 2015-03-12 (×2): qty 1

## 2015-03-12 MED ORDER — 0.9 % SODIUM CHLORIDE (POUR BTL) OPTIME
TOPICAL | Status: DC | PRN
  Administered 2015-03-12: 1000 mL

## 2015-03-12 MED ORDER — FENTANYL CITRATE (PF) 100 MCG/2ML IJ SOLN
INTRAMUSCULAR | Status: DC | PRN
  Administered 2015-03-12 (×5): 50 ug via INTRAVENOUS

## 2015-03-12 MED ORDER — ARTIFICIAL TEARS OP OINT
TOPICAL_OINTMENT | OPHTHALMIC | Status: DC | PRN
  Administered 2015-03-12: 1 via OPHTHALMIC

## 2015-03-12 MED ORDER — GLYCOPYRROLATE 0.2 MG/ML IJ SOLN
INTRAMUSCULAR | Status: AC
  Filled 2015-03-12: qty 1

## 2015-03-12 MED ORDER — ONDANSETRON HCL 4 MG/2ML IJ SOLN
INTRAMUSCULAR | Status: AC
  Filled 2015-03-12: qty 2

## 2015-03-12 MED ORDER — HYDROMORPHONE HCL 1 MG/ML IJ SOLN
0.2500 mg | INTRAMUSCULAR | Status: DC | PRN
  Administered 2015-03-12: 0.5 mg via INTRAVENOUS

## 2015-03-12 MED ORDER — BACITRACIN ZINC 500 UNIT/GM EX OINT
TOPICAL_OINTMENT | CUTANEOUS | Status: AC
  Filled 2015-03-12: qty 28.35

## 2015-03-12 MED ORDER — SIMVASTATIN 20 MG PO TABS
20.0000 mg | ORAL_TABLET | Freq: Every day | ORAL | Status: DC
  Administered 2015-03-13: 20 mg via ORAL
  Filled 2015-03-12: qty 1

## 2015-03-12 MED ORDER — AMLODIPINE BESYLATE 5 MG PO TABS
5.0000 mg | ORAL_TABLET | Freq: Every day | ORAL | Status: DC
  Administered 2015-03-13: 5 mg via ORAL
  Filled 2015-03-12: qty 1

## 2015-03-12 MED ORDER — LIDOCAINE-EPINEPHRINE 1 %-1:100000 IJ SOLN
INTRAMUSCULAR | Status: DC | PRN
  Administered 2015-03-12: 3.5 mL

## 2015-03-12 MED ORDER — MELOXICAM 7.5 MG PO TABS
7.5000 mg | ORAL_TABLET | Freq: Two times a day (BID) | ORAL | Status: DC
Start: 2015-03-12 — End: 2015-03-13
  Administered 2015-03-12 – 2015-03-13 (×2): 7.5 mg via ORAL
  Filled 2015-03-12 (×3): qty 1

## 2015-03-12 MED ORDER — MIDAZOLAM HCL 2 MG/2ML IJ SOLN
INTRAMUSCULAR | Status: AC
  Filled 2015-03-12: qty 2

## 2015-03-12 MED ORDER — SUCCINYLCHOLINE CHLORIDE 20 MG/ML IJ SOLN
INTRAMUSCULAR | Status: AC
  Filled 2015-03-12: qty 1

## 2015-03-12 MED ORDER — LIDOCAINE HCL (CARDIAC) 20 MG/ML IV SOLN
INTRAVENOUS | Status: DC | PRN
  Administered 2015-03-12: 80 mg via INTRAVENOUS

## 2015-03-12 MED ORDER — PROMETHAZINE HCL 25 MG/ML IJ SOLN
6.2500 mg | INTRAMUSCULAR | Status: DC | PRN
Start: 2015-03-12 — End: 2015-03-12

## 2015-03-12 MED ORDER — PROPOFOL 10 MG/ML IV BOLUS
INTRAVENOUS | Status: AC
  Filled 2015-03-12: qty 20

## 2015-03-12 MED ORDER — ONDANSETRON HCL 4 MG/2ML IJ SOLN
INTRAMUSCULAR | Status: DC | PRN
  Administered 2015-03-12: 4 mg via INTRAVENOUS

## 2015-03-12 MED ORDER — HYDROMORPHONE HCL 1 MG/ML IJ SOLN
INTRAMUSCULAR | Status: AC
  Administered 2015-03-12: 0.5 mg via INTRAVENOUS
  Filled 2015-03-12: qty 1

## 2015-03-12 MED ORDER — PHENOL 1.4 % MT LIQD: 2.0000 | Freq: Three times a day (TID) | OROMUCOSAL | Status: DC | PRN

## 2015-03-12 MED ORDER — DIPHENHYDRAMINE HCL 50 MG/ML IJ SOLN: 25.0000 mg | Freq: Four times a day (QID) | INTRAMUSCULAR | Status: DC | PRN

## 2015-03-12 MED ORDER — EPHEDRINE SULFATE 50 MG/ML IJ SOLN
INTRAMUSCULAR | Status: DC | PRN
  Administered 2015-03-12 (×2): 10 mg via INTRAVENOUS

## 2015-03-12 MED ORDER — LIDOCAINE-EPINEPHRINE 1 %-1:100000 IJ SOLN
INTRAMUSCULAR | Status: AC
  Filled 2015-03-12: qty 1

## 2015-03-12 MED ORDER — FLUOXETINE HCL 20 MG PO CAPS
20.0000 mg | ORAL_CAPSULE | Freq: Every day | ORAL | Status: DC
  Administered 2015-03-13: 20 mg via ORAL
  Filled 2015-03-12: qty 1

## 2015-03-12 MED ORDER — MIDAZOLAM HCL 5 MG/5ML IJ SOLN
INTRAMUSCULAR | Status: DC | PRN
  Administered 2015-03-12: 1 mg via INTRAVENOUS

## 2015-03-12 MED ORDER — PROMETHAZINE HCL 25 MG PO TABS: 12.5000 mg | ORAL_TABLET | Freq: Four times a day (QID) | ORAL | Status: DC | PRN

## 2015-03-12 MED ORDER — SODIUM CHLORIDE 0.9 % IJ SOLN
INTRAMUSCULAR | Status: AC
  Filled 2015-03-12: qty 10

## 2015-03-12 MED ORDER — LIDOCAINE HCL (CARDIAC) 20 MG/ML IV SOLN
INTRAVENOUS | Status: AC
  Filled 2015-03-12: qty 5

## 2015-03-12 MED ORDER — PROPOFOL 10 MG/ML IV BOLUS
INTRAVENOUS | Status: DC | PRN
  Administered 2015-03-12: 200 mg via INTRAVENOUS

## 2015-03-12 MED ORDER — DIAZEPAM 5 MG PO TABS
10.0000 mg | ORAL_TABLET | Freq: Two times a day (BID) | ORAL | Status: DC
  Administered 2015-03-12 – 2015-03-13 (×2): 10 mg via ORAL
  Filled 2015-03-12 (×2): qty 2

## 2015-03-12 MED ORDER — ONDANSETRON HCL 4 MG/2ML IJ SOLN
4.0000 mg | Freq: Four times a day (QID) | INTRAMUSCULAR | Status: DC | PRN
Start: 2015-03-12 — End: 2015-03-13

## 2015-03-12 SURGICAL SUPPLY — 61 items
ADH SKN CLS APL DERMABOND .7 (GAUZE/BANDAGES/DRESSINGS) ×1
APL SKNCLS STERI-STRIP NONHPOA (GAUZE/BANDAGES/DRESSINGS) ×1
ATTRACTOMAT 16X20 MAGNETIC DRP (DRAPES) IMPLANT
BENZOIN TINCTURE PRP APPL 2/3 (GAUZE/BANDAGES/DRESSINGS) ×2 IMPLANT
BLADE SURG 15 STRL LF DISP TIS (BLADE) IMPLANT
BLADE SURG 15 STRL SS (BLADE)
CANISTER SUCTION 2500CC (MISCELLANEOUS) ×3 IMPLANT
CLEANER TIP ELECTROSURG 2X2 (MISCELLANEOUS) ×3 IMPLANT
CONT SPEC 4OZ CLIKSEAL STRL BL (MISCELLANEOUS) ×3 IMPLANT
CORDS BIPOLAR (ELECTRODE) ×5 IMPLANT
COVER SURGICAL LIGHT HANDLE (MISCELLANEOUS) ×3 IMPLANT
DECANTER SPIKE VIAL GLASS SM (MISCELLANEOUS) ×3 IMPLANT
DERMABOND ADVANCED (GAUZE/BANDAGES/DRESSINGS) ×2
DERMABOND ADVANCED .7 DNX12 (GAUZE/BANDAGES/DRESSINGS) ×1 IMPLANT
DRAIN CHANNEL 7F 3/4 FLAT (WOUND CARE) ×2 IMPLANT
DRAIN PENROSE 1/4X12 LTX STRL (WOUND CARE) IMPLANT
DRAIN SNY 10 ROU (WOUND CARE) IMPLANT
DRAPE PROXIMA HALF (DRAPES) IMPLANT
DRAPE SURG 17X23 STRL (DRAPES) IMPLANT
ELECT COATED BLADE 2.86 ST (ELECTRODE) ×3 IMPLANT
ELECT REM PT RETURN 9FT ADLT (ELECTROSURGICAL) ×3
ELECTRODE REM PT RTRN 9FT ADLT (ELECTROSURGICAL) ×1 IMPLANT
EVACUATOR SILICONE 100CC (DRAIN) ×2 IMPLANT
GAUZE SPONGE 4X4 16PLY XRAY LF (GAUZE/BANDAGES/DRESSINGS) ×2 IMPLANT
GLOVE BIO SURGEON STRL SZ 6.5 (GLOVE) ×2 IMPLANT
GLOVE BIO SURGEON STRL SZ7.5 (GLOVE) ×2 IMPLANT
GLOVE BIO SURGEONS STRL SZ 6.5 (GLOVE) ×2
GLOVE BIOGEL PI IND STRL 6.5 (GLOVE) IMPLANT
GLOVE BIOGEL PI IND STRL 7.5 (GLOVE) IMPLANT
GLOVE BIOGEL PI INDICATOR 6.5 (GLOVE) ×2
GLOVE BIOGEL PI INDICATOR 7.5 (GLOVE) ×2
GLOVE ECLIPSE 7.5 STRL STRAW (GLOVE) ×3 IMPLANT
GLOVE SURG SS PI 7.0 STRL IVOR (GLOVE) ×2 IMPLANT
GOWN STRL REUS W/ TWL LRG LVL3 (GOWN DISPOSABLE) ×2 IMPLANT
GOWN STRL REUS W/TWL LRG LVL3 (GOWN DISPOSABLE) ×9
KIT BASIN OR (CUSTOM PROCEDURE TRAY) ×3 IMPLANT
KIT ROOM TURNOVER OR (KITS) ×3 IMPLANT
LOCATOR NERVE 3 VOLT (DISPOSABLE) IMPLANT
NDL HYPO 25GX1X1/2 BEV (NEEDLE) IMPLANT
NEEDLE HYPO 25GX1X1/2 BEV (NEEDLE) ×3 IMPLANT
NS IRRIG 1000ML POUR BTL (IV SOLUTION) ×3 IMPLANT
PAD ARMBOARD 7.5X6 YLW CONV (MISCELLANEOUS) ×6 IMPLANT
PENCIL BUTTON HOLSTER BLD 10FT (ELECTRODE) ×3 IMPLANT
PROBE NERVBE PRASS .33 (MISCELLANEOUS) IMPLANT
SOL PREP POV-IOD 4OZ 10% (MISCELLANEOUS) ×2 IMPLANT
SPECIMEN JAR SMALL (MISCELLANEOUS) IMPLANT
STAPLER VISISTAT 35W (STAPLE) ×3 IMPLANT
SUT CHROMIC 3 0 PS 2 (SUTURE) IMPLANT
SUT ETHILON 2 0 FS 18 (SUTURE) ×2 IMPLANT
SUT ETHILON 3 0 PS 1 (SUTURE) IMPLANT
SUT ETHILON 5 0 P 3 18 (SUTURE)
SUT NYLON ETHILON 5-0 P-3 1X18 (SUTURE) IMPLANT
SUT SILK 2 0 REEL (SUTURE) IMPLANT
SUT SILK 2 0 SH CR/8 (SUTURE) ×3 IMPLANT
SUT SILK 3 0 REEL (SUTURE) ×3 IMPLANT
SUT SILK 4 0 TIES 17X18 (SUTURE) ×3 IMPLANT
SUT VIC AB 3-0 FS2 27 (SUTURE) ×2 IMPLANT
SUT VICRYL 4-0 PS2 18IN ABS (SUTURE) ×2 IMPLANT
TOWEL OR 17X24 6PK STRL BLUE (TOWEL DISPOSABLE) ×2 IMPLANT
TRAY ENT MC OR (CUSTOM PROCEDURE TRAY) ×3 IMPLANT
WATER STERILE IRR 1000ML POUR (IV SOLUTION) ×1 IMPLANT

## 2015-03-12 NOTE — H&P (Signed)
Patrick Aguilar is an 64 y.o. male.   Chief Complaint: Left submandibular sialolithiasis HPI: 64 year old male with left submandibular gland enlargement and intermittent pain since November.  Imaging at the time demonstrated a 5 x 9 mm stone at the gland.  He has been treated with antibiotics on a few occasions.  With continued problems, he presents for surgical management.  Past Medical History  Diagnosis Date  . Shortness of breath dyspnea   . COPD (chronic obstructive pulmonary disease)   . Anxiety   . GERD (gastroesophageal reflux disease)   . Arthritis     Past Surgical History  Procedure Laterality Date  . Cardiac catheterization  06    neg  . Back surgery      x5  . Cervical disc surgery    . Shoulders Bilateral     rotator cuff  . Tonsillectomy    . Appendectomy    . Plantar fascia surgery Bilateral     History reviewed. No pertinent family history. Social History:  reports that he has been smoking Cigarettes.  He has a 50 pack-year smoking history. He does not have any smokeless tobacco history on file. He reports that he does not drink alcohol or use illicit drugs.  Allergies:  Allergies  Allergen Reactions  . Morphine And Related Itching    Medications Prior to Admission  Medication Sig Dispense Refill  . albuterol (PROVENTIL) (2.5 MG/3ML) 0.083% nebulizer solution Take 2.5 mg by nebulization every 6 (six) hours as needed for wheezing or shortness of breath.   0  . amLODipine (NORVASC) 5 MG tablet Take 5 mg by mouth daily.   0  . atenolol-chlorthalidone (TENORETIC) 50-25 MG per tablet Take 1 tablet by mouth daily.   0  . benazepril (LOTENSIN) 20 MG tablet Take 20 mg by mouth daily.   0  . diazepam (VALIUM) 10 MG tablet Take 10 mg by mouth 2 (two) times daily.  0  . doxazosin (CARDURA) 8 MG tablet Take 8 mg by mouth daily.  1  . FLUoxetine (PROZAC) 20 MG capsule Take 20 mg by mouth daily.   1  . gabapentin (NEURONTIN) 800 MG tablet Take 800 mg by mouth 3 (three)  times daily.   1  . HYDROcodone-acetaminophen (NORCO) 10-325 MG per tablet Take 1 tablet by mouth 3 (three) times daily as needed (pain).    . meloxicam (MOBIC) 7.5 MG tablet Take 7.5 mg by mouth 2 (two) times daily. Take with food or milk  0  . potassium chloride SA (K-DUR,KLOR-CON) 20 MEQ tablet Take 40 mEq by mouth daily.   1  . ranitidine (ZANTAC) 150 MG tablet Take 150 mg by mouth 2 (two) times daily.   0  . simvastatin (ZOCOR) 20 MG tablet Take 20 mg by mouth daily.   0    No results found for this or any previous visit (from the past 48 hour(s)). No results found.  Review of Systems  All other systems reviewed and are negative.   Blood pressure 126/87, pulse 62, temperature 97.6 F (36.4 C), temperature source Oral, resp. rate 20, height 5' 6.5" (1.689 m), weight 87.091 kg (192 lb), SpO2 96 %. Physical Exam  Constitutional: He is oriented to person, place, and time. He appears well-developed and well-nourished. No distress.  HENT:  Head: Normocephalic and atraumatic.  Right Ear: External ear normal.  Left Ear: External ear normal.  Nose: Nose normal.  Mouth/Throat: Oropharynx is clear and moist.  Eyes: Conjunctivae  and EOM are normal. Pupils are equal, round, and reactive to light.  Neck: Normal range of motion. Neck supple.  Cardiovascular: Normal rate.   Respiratory: Effort normal.  Neurological: He is alert and oriented to person, place, and time. No cranial nerve deficit.  Skin: Skin is warm and dry.  Psychiatric: He has a normal mood and affect. His behavior is normal. Judgment and thought content normal.     Assessment/Plan Left submandibular sialolithiasis, COPD To OR for left submandibular gland resection.  Plan overnight observation due to COPD.  Patrick Aguilar 03/12/2015, 12:16 PM

## 2015-03-12 NOTE — Progress Notes (Signed)
Subjective: POD#0 from excision of left submandibular gland  Objective: Vital signs in last 24 hours: Temp:  [96.8 F (36 C)-97.6 F (36.4 C)] 96.8 F (36 C) (06/30 1515) Pulse Rate:  [58-69] 62 (06/30 1512) Resp:  [9-20] 9 (06/30 1512) BP: (96-126)/(50-88) 114/64 mmHg (06/30 1512) SpO2:  [90 %-97 %] 93 % (06/30 1512) Weight:  [87.091 kg (192 lb)] 87.091 kg (192 lb) (06/30 0934)  Facial nerve House-Brackmann 1/6 bilaterally in all divisions. Left submandibular incision is clean dry and intact with drain holding suction and with sanguinous drainage.  @LABLAST2 (wbc:2,hgb:2,hct:2,plt:2) No results for input(s): NA, K, CL, CO2, GLUCOSE, BUN, CREATININE, CALCIUM in the last 72 hours.  Medications:  Scheduled Meds: . [START ON 03/13/2015] amLODipine  5 mg Oral Daily  . [START ON 03/13/2015] atenolol-chlorthalidone  1 tablet Oral Daily  . benazepril  20 mg Oral Daily  .  ceFAZolin (ANCEF) IV  1 g Intravenous Q8H  . diazepam  10 mg Oral BID  . doxazosin  8 mg Oral Daily  . famotidine  20 mg Oral BID  . [START ON 03/13/2015] FLUoxetine  20 mg Oral Daily  . gabapentin  800 mg Oral TID  . meloxicam  7.5 mg Oral BID  . potassium chloride SA  40 mEq Oral Daily  . [START ON 03/13/2015] simvastatin  20 mg Oral Daily   Continuous Infusions: . dextrose 5 % and 0.45 % NaCl with KCl 20 mEq/L 75 mL/hr at 03/12/15 1609   PRN Meds:.albuterol, HYDROcodone-acetaminophen, promethazine **OR** promethazine  Assessment/Plan: Doing well s/p left submandibular gland excision. Monitor drain output     Melvenia BeamGore, Kushi Kun 03/12/2015, 4:17 PM

## 2015-03-12 NOTE — Transfer of Care (Signed)
Immediate Anesthesia Transfer of Care Note  Patient: Tish FredericksonCharles W Camp  Procedure(s) Performed: Procedure(s): LEFT SUBMANDIBULAR GLAND RESECTION (Left)  Patient Location: PACU  Anesthesia Type:General  Level of Consciousness: awake, alert , oriented and sedated  Airway & Oxygen Therapy: Patient Spontanous Breathing and Patient connected to nasal cannula oxygen  Post-op Assessment: Report given to RN, Post -op Vital signs reviewed and stable and Patient moving all extremities  Post vital signs: Reviewed and stable  Last Vitals:  Filed Vitals:   03/12/15 1410  BP:   Pulse:   Temp: 36.1 C  Resp:     Complications: No apparent anesthesia complications

## 2015-03-12 NOTE — Anesthesia Preprocedure Evaluation (Signed)
Anesthesia Evaluation  Patient identified by MRN, date of birth, ID band Patient awake    Reviewed: Allergy & Precautions, NPO status   Airway Mallampati: II       Dental   Pulmonary shortness of breath, COPDCurrent Smoker,  breath sounds clear to auscultation        Cardiovascular Rhythm:Regular Rate:Normal     Neuro/Psych    GI/Hepatic GERD-  ,  Endo/Other    Renal/GU      Musculoskeletal  (+) Arthritis -,   Abdominal   Peds  Hematology   Anesthesia Other Findings   Reproductive/Obstetrics                             Anesthesia Physical Anesthesia Plan  ASA: III  Anesthesia Plan: General   Post-op Pain Management:    Induction: Intravenous  Airway Management Planned: Oral ETT  Additional Equipment:   Intra-op Plan:   Post-operative Plan: Extubation in OR  Informed Consent: I have reviewed the patients History and Physical, chart, labs and discussed the procedure including the risks, benefits and alternatives for the proposed anesthesia with the patient or authorized representative who has indicated his/her understanding and acceptance.   Dental advisory given  Plan Discussed with:   Anesthesia Plan Comments:         Anesthesia Quick Evaluation

## 2015-03-12 NOTE — Anesthesia Procedure Notes (Signed)
Procedure Name: Intubation Date/Time: 03/12/2015 12:40 PM Performed by: Fransisca KaufmannMEYER, Ehtan Delfavero E Pre-anesthesia Checklist: Patient identified, Emergency Drugs available, Suction available, Patient being monitored and Timeout performed Patient Re-evaluated:Patient Re-evaluated prior to inductionOxygen Delivery Method: Circle system utilized Preoxygenation: Pre-oxygenation with 100% oxygen Intubation Type: IV induction Ventilation: Mask ventilation without difficulty Laryngoscope Size: Miller, 3 and Glidescope Grade View: Grade III Tube type: Oral Number of attempts: 2 Airway Equipment and Method: Stylet and Video-laryngoscopy Placement Confirmation: ETT inserted through vocal cords under direct vision,  positive ETCO2 and breath sounds checked- equal and bilateral Secured at: 23 cm Tube secured with: Tape Dental Injury: Teeth and Oropharynx as per pre-operative assessment  Difficulty Due To: Difficulty was anticipated Comments: DL x 1, look with Hyacinth MeekerMiller 3/out/DL x 1 #1.6#7.5 OTT placed with glide scope/ no stylet used/ cuff up/ anterior larynx, small mouth opening/narrow high arched palate

## 2015-03-12 NOTE — Brief Op Note (Signed)
03/12/2015  1:53 PM  PATIENT:  Tish Fredericksonharles W Coote  64 y.o. male  PRE-OPERATIVE DIAGNOSIS:  Left submandibular stone  POST-OPERATIVE DIAGNOSIS:  Left submandibular stone  PROCEDURE:  Procedure(s): LEFT SUBMANDIBULAR GLAND RESECTION (Left)  SURGEON:  Surgeon(s) and Role:    * Christia Readingwight Shanetra Blumenstock, MD - Primary  PHYSICIAN ASSISTANT:  [place]  ASSISTANTS: none   ANESTHESIA:   general  EBL:  Total I/O In: 1300 [I.V.:1300] Out: -   BLOOD ADMINISTERED:none  DRAINS: (7 Fr) Jackson-Pratt drain(s) with closed bulb suction in the left neck   LOCAL MEDICATIONS USED:  LIDOCAINE   SPECIMEN:  Source of Specimen:  left submandibular gland  DISPOSITION OF SPECIMEN:  PATHOLOGY  COUNTS:  YES  TOURNIQUET:  * No tourniquets in log *  DICTATION: .Other Dictation: Dictation Number [id number]  PLAN OF CARE: Admit for overnight observation  PATIENT DISPOSITION:  PACU - hemodynamically stable.   Delay start of Pharmacological VTE agent (>24hrs) due to surgical blood loss or risk of bleeding: yes

## 2015-03-13 ENCOUNTER — Encounter (HOSPITAL_COMMUNITY): Payer: Self-pay | Admitting: Otolaryngology

## 2015-03-13 DIAGNOSIS — K115 Sialolithiasis: Secondary | ICD-10-CM | POA: Diagnosis not present

## 2015-03-13 MED ORDER — CHLORTHALIDONE 25 MG PO TABS
25.0000 mg | ORAL_TABLET | Freq: Every day | ORAL | Status: DC
  Administered 2015-03-13: 25 mg via ORAL
  Filled 2015-03-13: qty 1

## 2015-03-13 MED ORDER — ATENOLOL 50 MG PO TABS
50.0000 mg | ORAL_TABLET | Freq: Every day | ORAL | Status: DC
  Administered 2015-03-13: 50 mg via ORAL
  Filled 2015-03-13: qty 1

## 2015-03-13 NOTE — Discharge Summary (Signed)
Physician Discharge Summary  Patient ID: Patrick FredericksonCharles W Aguilar MRN: [id number] DOB/AGE: 64-24-1952 64 y.o.  Admit date: 03/12/2015 Discharge date: 03/13/2015  Admission Diagnoses: Left submandibular sialolithiasis  Discharge Diagnoses:  Active Problems:   Sialolithiasis of submandibular gland   Discharged Condition: good  Hospital Course: 64 year old male with swelling of left submandibular gland and intermittent pain since November.  Stone found in left submandibular gland hylum on CT.  Presented for resection of left submandibular gland.  See operative note.  Observed overnight after surgery with drain in place and did well.  On POD 1, pain controlled and drain removed.  Felt stable for discharge.  Breathing well.  Consults: None  Significant Diagnostic Studies: None  Treatments: surgery: left submandibular gland resection  Discharge Exam: Blood pressure 110/64, pulse 66, temperature 98 F (36.7 C), temperature source Oral, resp. rate 17, height 5' 6.5" (1.689 m), weight 87.091 kg (192 lb), SpO2 95 %. General appearance: alert, cooperative and no distress Neck: left submandibular incision clean and intact, no fluid collection, drain removed, normal facial movement  Disposition:   Discharge Instructions    Diet - low sodium heart healthy    Complete by:  As directed      Discharge instructions    Complete by:  As directed   Do not apply any ointment to incision.  OK to get incision wet, gently pat dry.  Watch for signs of infection including fever, pus draining from incision, or redness spreading from incision.  Call Dr. Jenne PaneBates with concerns.  Normal diet.  Limited activity.  Use pain medicine you have at home for pain control.     Increase activity slowly    Complete by:  As directed             Medication List    TAKE these medications        albuterol (2.5 MG/3ML) 0.083% nebulizer solution  Commonly known as:  PROVENTIL  Take 2.5 mg by nebulization every 6 (six) hours as  needed for wheezing or shortness of breath.     amLODipine 5 MG tablet  Commonly known as:  NORVASC  Take 5 mg by mouth daily.     atenolol-chlorthalidone 50-25 MG per tablet  Commonly known as:  TENORETIC  Take 1 tablet by mouth daily.     benazepril 20 MG tablet  Commonly known as:  LOTENSIN  Take 20 mg by mouth daily.     diazepam 10 MG tablet  Commonly known as:  VALIUM  Take 10 mg by mouth 2 (two) times daily.     doxazosin 8 MG tablet  Commonly known as:  CARDURA  Take 8 mg by mouth daily.     FLUoxetine 20 MG capsule  Commonly known as:  PROZAC  Take 20 mg by mouth daily.     gabapentin 800 MG tablet  Commonly known as:  NEURONTIN  Take 800 mg by mouth 3 (three) times daily.     HYDROcodone-acetaminophen 10-325 MG per tablet  Commonly known as:  NORCO  Take 1 tablet by mouth 3 (three) times daily as needed (pain).     meloxicam 7.5 MG tablet  Commonly known as:  MOBIC  Take 7.5 mg by mouth 2 (two) times daily. Take with food or milk     potassium chloride SA 20 MEQ tablet  Commonly known as:  K-DUR,KLOR-CON  Take 40 mEq by mouth daily.     ranitidine 150 MG tablet  Commonly known as:  ZANTAC  Take 150 mg by mouth 2 (two) times daily.     simvastatin 20 MG tablet  Commonly known as:  ZOCOR  Take 20 mg by mouth daily.           Follow-up Information    Follow up with Tylek Boney, MD. Schedule an appointment as soon as possible for a visit in 1 week.   Specialty:  Otolaryngology   Contact information:   [address] [place] [address] [phone]       Signed: Christia Reading 03/13/2015, 8:27 AM

## 2015-03-13 NOTE — Op Note (Signed)
NAME:  AZTLAN, COLL NO.:  [id number]  MEDICAL RECORD NO.:  [id number]  LOCATION:  [id number]                        FACILITY:  [facility]  PHYSICIAN:  Antony Contras, MD     DATE OF BIRTH:  06/25/1951  DATE OF PROCEDURE:  03/12/2015 DATE OF DISCHARGE:                              OPERATIVE REPORT   PREOPERATIVE DIAGNOSIS:  Left submandibular sialolithiasis.  POSTOPERATIVE DIAGNOSIS:  Left submandibular sialolithiasis.  PROCEDURE:  Left submandibular gland resection.  SURGEON:  Antony Contras, MD.  ASSISTANT:  Aquilla Hacker, [facility]  ANESTHESIA:  General endotracheal anesthesia.  COMPLICATIONS:  None.  INDICATION:  The patient is a 64 year old male who has a history since November of left submandibular gland swelling and intermittent pain.  He was found to have an obstructing stone in the left submandibular duct system against the gland.  He presents to the operating room for surgical management.  FINDINGS:  The submandibular gland was fairly normal on inspection and there was a firm area palpated within the hilum of the gland after resection.  DESCRIPTION OF PROCEDURE:  The patient was identified in the holding room, informed consent having been obtained including discussion of risks, benefits, alternatives, the patient was brought to the operative suite and put on the operative table in supine position.  Anesthesia was induced.  The patient was intubated by the Anesthesia team without difficulty.  The patient was given intravenous antibiotics during the case.  The eyes taped closed and a shoulder roll was placed.  The left neck incision was marked with a marking pen and injected with 1% lidocaine with 1:100,000 epinephrine.  The left neck was prepped and draped in sterile fashion.  The skin incision was made with a 15 blade scalpel and skin extended through subcutaneous layer and platysma layer using Bovie cautery.  Dissection was then performed down to the  inferior extent of the submandibular gland and the posterior facial vein was identified and divided and ligated.  Dissection then continued deep to the posterior facial vein and down to the submandibular gland surface. Soft tissues were then dissected off the submandibular gland surface in all directions using Bovie cautery towards the hilum of the gland.  This included anterior retraction of the mylohyoid muscle when it was encountered, exposing the floor of mouth area.  Vasculature related to the gland was divided and ligated and the nerve connections to the lingual nerve were also divided and ligated, keeping the lingual nerve intact.  Continued dissection then allowed for removal of the gland at the submandibular duct which was ligated.  The gland was passed to nursing for pathology.  The depths of the wound were copiously irrigated with saline.  Bleeding was controlled by Bovie electrocautery.  A 7- [place] round drain was placed in the depth of the wound and secured to the skin using 2-0 nylon suture in a standard drain stitch.  The platysma layer was closed with 3-0 Vicryl suture in a simple interrupted fashion.  The skin was closed in subcutaneous layer using 4-0 Vicryl suture in a simple interrupted fashion.  The skin was closed with Dermabond.  The drain was hooked to suction during closure.  The  patient was then cleaned off and the drapes were removed.  The drain was taped to the left shoulder under bulb suction.  The patient returned to anesthesia for wake-up, was extubated, and moved to the recovery room in stable condition.     Antony Contraswight D Essie Gehret, MD     DDB/MEDQ  D:  03/12/2015  T:  03/13/2015  Job:  [id number]  cc:   Antony Contraswight D Sigismund Cross, MD

## 2015-03-13 NOTE — Anesthesia Postprocedure Evaluation (Signed)
  Anesthesia Post-op Note  Patient: Patrick Aguilar  Procedure(s) Performed: Procedure(s): LEFT SUBMANDIBULAR GLAND RESECTION (Left)  Patient Location: PACU  Anesthesia Type:General  Level of Consciousness: awake and alert   Airway and Oxygen Therapy: Patient Spontanous Breathing  Post-op Pain: mild  Post-op Assessment: Post-op Vital signs reviewed              Post-op Vital Signs: stable  Last Vitals:  Filed Vitals:   03/13/15 0959  BP: 131/72  Pulse: 80  Temp: 36.8 C  Resp: 17    Complications: No apparent anesthesia complications

## 2017-05-26 DIAGNOSIS — M47812 Spondylosis without myelopathy or radiculopathy, cervical region: Secondary | ICD-10-CM | POA: Insufficient documentation

## 2017-12-11 DIAGNOSIS — R6521 Severe sepsis with septic shock: Secondary | ICD-10-CM

## 2017-12-11 DIAGNOSIS — J9600 Acute respiratory failure, unspecified whether with hypoxia or hypercapnia: Secondary | ICD-10-CM

## 2017-12-11 DIAGNOSIS — A419 Sepsis, unspecified organism: Secondary | ICD-10-CM

## 2017-12-11 DIAGNOSIS — N179 Acute kidney failure, unspecified: Secondary | ICD-10-CM

## 2017-12-11 HISTORY — DX: Acute kidney failure, unspecified: N17.9

## 2017-12-11 HISTORY — DX: Acute respiratory failure, unspecified whether with hypoxia or hypercapnia: J96.00

## 2017-12-11 HISTORY — DX: Severe sepsis with septic shock: R65.21

## 2017-12-11 HISTORY — DX: Sepsis, unspecified organism: A41.9

## 2017-12-13 ENCOUNTER — Inpatient Hospital Stay (HOSPITAL_COMMUNITY): Payer: Medicare HMO

## 2017-12-13 ENCOUNTER — Emergency Department (HOSPITAL_COMMUNITY): Payer: Medicare HMO | Admitting: Anesthesiology

## 2017-12-13 ENCOUNTER — Encounter (HOSPITAL_COMMUNITY)
Admission: EM | Disposition: A | Discharge status: Home / Self Care | Payer: Self-pay | Source: Home / Self Care | Attending: Internal Medicine

## 2017-12-13 ENCOUNTER — Emergency Department (HOSPITAL_COMMUNITY): Payer: Medicare HMO

## 2017-12-13 ENCOUNTER — Other Ambulatory Visit: Payer: Self-pay

## 2017-12-13 ENCOUNTER — Encounter (HOSPITAL_COMMUNITY): Payer: Self-pay | Admitting: Emergency Medicine

## 2017-12-13 ENCOUNTER — Inpatient Hospital Stay (HOSPITAL_COMMUNITY)
Admission: EM | Admit: 2017-12-13 | Discharge: 2017-12-28 | DRG: 853 | Disposition: A | Discharge status: Home / Self Care | Payer: Medicare HMO | Attending: Internal Medicine | Admitting: Internal Medicine

## 2017-12-13 DIAGNOSIS — N5082 Scrotal pain: Secondary | ICD-10-CM

## 2017-12-13 DIAGNOSIS — E876 Hypokalemia: Secondary | ICD-10-CM | POA: Diagnosis not present

## 2017-12-13 DIAGNOSIS — J810 Acute pulmonary edema: Secondary | ICD-10-CM | POA: Diagnosis not present

## 2017-12-13 DIAGNOSIS — M726 Necrotizing fasciitis: Secondary | ICD-10-CM | POA: Diagnosis present

## 2017-12-13 DIAGNOSIS — F17210 Nicotine dependence, cigarettes, uncomplicated: Secondary | ICD-10-CM | POA: Diagnosis present

## 2017-12-13 DIAGNOSIS — K7200 Acute and subacute hepatic failure without coma: Secondary | ICD-10-CM | POA: Diagnosis present

## 2017-12-13 DIAGNOSIS — F419 Anxiety disorder, unspecified: Secondary | ICD-10-CM | POA: Diagnosis present

## 2017-12-13 DIAGNOSIS — R197 Diarrhea, unspecified: Secondary | ICD-10-CM | POA: Diagnosis not present

## 2017-12-13 DIAGNOSIS — G8929 Other chronic pain: Secondary | ICD-10-CM | POA: Diagnosis present

## 2017-12-13 DIAGNOSIS — A419 Sepsis, unspecified organism: Secondary | ICD-10-CM | POA: Diagnosis present

## 2017-12-13 DIAGNOSIS — N170 Acute kidney failure with tubular necrosis: Secondary | ICD-10-CM | POA: Diagnosis present

## 2017-12-13 DIAGNOSIS — J449 Chronic obstructive pulmonary disease, unspecified: Secondary | ICD-10-CM | POA: Diagnosis present

## 2017-12-13 DIAGNOSIS — E870 Hyperosmolality and hypernatremia: Secondary | ICD-10-CM | POA: Diagnosis not present

## 2017-12-13 DIAGNOSIS — E1122 Type 2 diabetes mellitus with diabetic chronic kidney disease: Secondary | ICD-10-CM | POA: Diagnosis present

## 2017-12-13 DIAGNOSIS — R6521 Severe sepsis with septic shock: Secondary | ICD-10-CM | POA: Diagnosis present

## 2017-12-13 DIAGNOSIS — E861 Hypovolemia: Secondary | ICD-10-CM | POA: Diagnosis present

## 2017-12-13 DIAGNOSIS — G629 Polyneuropathy, unspecified: Secondary | ICD-10-CM | POA: Diagnosis present

## 2017-12-13 DIAGNOSIS — J9690 Respiratory failure, unspecified, unspecified whether with hypoxia or hypercapnia: Secondary | ICD-10-CM

## 2017-12-13 DIAGNOSIS — N493 Fournier gangrene: Secondary | ICD-10-CM | POA: Diagnosis present

## 2017-12-13 DIAGNOSIS — I10 Essential (primary) hypertension: Secondary | ICD-10-CM | POA: Diagnosis present

## 2017-12-13 DIAGNOSIS — N492 Inflammatory disorders of scrotum: Secondary | ICD-10-CM | POA: Diagnosis present

## 2017-12-13 DIAGNOSIS — D6489 Other specified anemias: Secondary | ICD-10-CM | POA: Diagnosis present

## 2017-12-13 DIAGNOSIS — M549 Dorsalgia, unspecified: Secondary | ICD-10-CM | POA: Diagnosis present

## 2017-12-13 DIAGNOSIS — N179 Acute kidney failure, unspecified: Secondary | ICD-10-CM | POA: Diagnosis not present

## 2017-12-13 DIAGNOSIS — N182 Chronic kidney disease, stage 2 (mild): Secondary | ICD-10-CM | POA: Diagnosis present

## 2017-12-13 DIAGNOSIS — I1310 Hypertensive heart and chronic kidney disease without heart failure, with stage 1 through stage 4 chronic kidney disease, or unspecified chronic kidney disease: Secondary | ICD-10-CM | POA: Diagnosis present

## 2017-12-13 DIAGNOSIS — E874 Mixed disorder of acid-base balance: Secondary | ICD-10-CM | POA: Diagnosis present

## 2017-12-13 DIAGNOSIS — D62 Acute posthemorrhagic anemia: Secondary | ICD-10-CM | POA: Diagnosis not present

## 2017-12-13 DIAGNOSIS — K219 Gastro-esophageal reflux disease without esophagitis: Secondary | ICD-10-CM | POA: Diagnosis present

## 2017-12-13 DIAGNOSIS — I493 Ventricular premature depolarization: Secondary | ICD-10-CM | POA: Diagnosis present

## 2017-12-13 DIAGNOSIS — E1165 Type 2 diabetes mellitus with hyperglycemia: Secondary | ICD-10-CM | POA: Diagnosis not present

## 2017-12-13 DIAGNOSIS — J80 Acute respiratory distress syndrome: Secondary | ICD-10-CM

## 2017-12-13 DIAGNOSIS — Z452 Encounter for adjustment and management of vascular access device: Secondary | ICD-10-CM

## 2017-12-13 DIAGNOSIS — M792 Neuralgia and neuritis, unspecified: Secondary | ICD-10-CM | POA: Diagnosis not present

## 2017-12-13 DIAGNOSIS — J9601 Acute respiratory failure with hypoxia: Secondary | ICD-10-CM

## 2017-12-13 DIAGNOSIS — G9341 Metabolic encephalopathy: Secondary | ICD-10-CM | POA: Diagnosis present

## 2017-12-13 DIAGNOSIS — J9600 Acute respiratory failure, unspecified whether with hypoxia or hypercapnia: Secondary | ICD-10-CM

## 2017-12-13 DIAGNOSIS — R0603 Acute respiratory distress: Secondary | ICD-10-CM | POA: Diagnosis not present

## 2017-12-13 DIAGNOSIS — Z4659 Encounter for fitting and adjustment of other gastrointestinal appliance and device: Secondary | ICD-10-CM

## 2017-12-13 HISTORY — PX: ORCHIECTOMY: SHX2116

## 2017-12-13 LAB — CBC WITH DIFFERENTIAL/PLATELET
Basophils Absolute: 0 10*3/uL (ref 0.0–0.1)
Basophils Relative: 0 %
EOS ABS: 0 10*3/uL (ref 0.0–0.7)
EOS PCT: 0 %
HEMATOCRIT: 32.2 % — AB (ref 39.0–52.0)
Hemoglobin: 11.1 g/dL — ABNORMAL LOW (ref 13.0–17.0)
LYMPHS ABS: 1 10*3/uL (ref 0.7–4.0)
Lymphocytes Relative: 4 %
MCH: 29.8 pg (ref 26.0–34.0)
MCHC: 34.5 g/dL (ref 30.0–36.0)
MCV: 86.3 fL (ref 78.0–100.0)
MONO ABS: 1.3 10*3/uL (ref 0.1–1.0)
MONOS PCT: 5 %
Neutro Abs: 23.8 10*3/uL (ref 1.7–7.7)
Neutrophils Relative %: 91 %
PLATELETS: 229 10*3/uL (ref 150–400)
RBC: 3.73 MIL/uL — AB (ref 4.22–5.81)
RDW: 13.9 % (ref 11.5–15.5)
WBC: 26.1 10*3/uL — AB (ref 4.0–10.5)

## 2017-12-13 LAB — COMPREHENSIVE METABOLIC PANEL
ALBUMIN: 2.5 g/dL — AB (ref 3.5–5.0)
ALT: 55 U/L (ref 17–63)
AST: 47 U/L — AB (ref 15–41)
Alkaline Phosphatase: 183 U/L — ABNORMAL HIGH (ref 38–126)
Anion gap: 19 — ABNORMAL HIGH (ref 5–15)
BUN: 64 mg/dL — AB (ref 6–20)
CHLORIDE: 94 mmol/L — AB (ref 101–111)
CO2: 16 mmol/L — ABNORMAL LOW (ref 22–32)
CREATININE: 4.24 mg/dL — AB (ref 0.61–1.24)
Calcium: 8.5 mg/dL — ABNORMAL LOW (ref 8.9–10.3)
GFR calc Af Amer: 15 mL/min — ABNORMAL LOW (ref >60)
GFR, EST NON AFRICAN AMERICAN: 13 mL/min — AB (ref >60)
Glucose, Bld: 277 mg/dL — ABNORMAL HIGH (ref 65–99)
Potassium: 4.9 mmol/L (ref 3.5–5.1)
Sodium: 129 mmol/L — ABNORMAL LOW (ref 135–145)
Total Bilirubin: 1 mg/dL (ref 0.3–1.2)
Total Protein: 6.4 g/dL — ABNORMAL LOW (ref 6.5–8.1)

## 2017-12-13 LAB — I-STAT CG4 LACTIC ACID, ED
LACTIC ACID, VENOUS: 2.32 mmol/L — AB (ref 0.5–1.9)
Lactic Acid, Venous: 5.19 mmol/L (ref 0.5–1.9)

## 2017-12-13 LAB — PHOSPHORUS: PHOSPHORUS: 4.7 mg/dL — AB (ref 2.5–4.6)

## 2017-12-13 LAB — BLOOD GAS, ARTERIAL
Acid-base deficit: 10.8 mmol/L — ABNORMAL HIGH (ref 0.0–2.0)
Bicarbonate: 16.7 mmol/L — ABNORMAL LOW (ref 20.0–28.0)
Drawn by: 422461
FIO2: 100
LHR: 18 {breaths}/min
MECHVT: 580 mL
O2 Saturation: 88.1 %
PATIENT TEMPERATURE: 97.5
PEEP: 14 cmH2O
PO2 ART: 68.3 mmHg — AB (ref 83.0–108.0)
pCO2 arterial: 45 mmHg (ref 32.0–48.0)
pH, Arterial: 7.19 — CL (ref 7.350–7.450)

## 2017-12-13 LAB — URINALYSIS, ROUTINE W REFLEX MICROSCOPIC
Bilirubin Urine: NEGATIVE
GLUCOSE, UA: NEGATIVE mg/dL
KETONES UR: NEGATIVE mg/dL
LEUKOCYTES UA: NEGATIVE
NITRITE: NEGATIVE
PH: 5 (ref 5.0–8.0)
Protein, ur: NEGATIVE mg/dL
Specific Gravity, Urine: 1.017 (ref 1.005–1.030)

## 2017-12-13 LAB — BASIC METABOLIC PANEL
ANION GAP: 10 (ref 5–15)
BUN: 54 mg/dL — ABNORMAL HIGH (ref 6–20)
CO2: 17 mmol/L — ABNORMAL LOW (ref 22–32)
Calcium: 7.5 mg/dL — ABNORMAL LOW (ref 8.9–10.3)
Chloride: 109 mmol/L (ref 101–111)
Creatinine, Ser: 2.9 mg/dL — ABNORMAL HIGH (ref 0.61–1.24)
GFR, EST AFRICAN AMERICAN: 24 mL/min — AB (ref >60)
GFR, EST NON AFRICAN AMERICAN: 21 mL/min — AB (ref >60)
GLUCOSE: 172 mg/dL — AB (ref 65–99)
Potassium: 4.3 mmol/L (ref 3.5–5.1)
SODIUM: 136 mmol/L (ref 135–145)

## 2017-12-13 LAB — GLUCOSE, CAPILLARY: GLUCOSE-CAPILLARY: 181 mg/dL — AB (ref 65–99)

## 2017-12-13 LAB — TROPONIN I: Troponin I: <0.03 ng/mL (ref <0.03)

## 2017-12-13 LAB — MAGNESIUM: MAGNESIUM: 2 mg/dL (ref 1.7–2.4)

## 2017-12-13 LAB — TRIGLYCERIDES: TRIGLYCERIDES: 430 mg/dL — AB (ref <150)

## 2017-12-13 SURGERY — ORCHIECTOMY
Anesthesia: General | Site: Scrotum

## 2017-12-13 MED ORDER — IPRATROPIUM-ALBUTEROL 0.5-2.5 (3) MG/3ML IN SOLN
3.0000 mL | Freq: Once | RESPIRATORY_TRACT | Status: AC
  Administered 2017-12-13: 3 mL via RESPIRATORY_TRACT

## 2017-12-13 MED ORDER — IPRATROPIUM-ALBUTEROL 0.5-2.5 (3) MG/3ML IN SOLN
3.0000 mL | RESPIRATORY_TRACT | Status: DC
Start: 2017-12-14 — End: 2017-12-14
  Administered 2017-12-14 (×2): 3 mL via RESPIRATORY_TRACT
  Filled 2017-12-13 (×2): qty 3

## 2017-12-13 MED ORDER — SODIUM CHLORIDE 0.9 % IV SOLN
INTRAVENOUS | Status: DC | PRN
  Administered 2017-12-13 (×2): via INTRAVENOUS

## 2017-12-13 MED ORDER — FENTANYL CITRATE (PF) 100 MCG/2ML IJ SOLN
INTRAMUSCULAR | Status: DC | PRN
  Administered 2017-12-13 (×2): 25 ug via INTRAVENOUS

## 2017-12-13 MED ORDER — FENTANYL CITRATE (PF) 100 MCG/2ML IJ SOLN: 50.0000 ug | Freq: Once | INTRAMUSCULAR | Status: AC

## 2017-12-13 MED ORDER — HEPARIN SODIUM (PORCINE) 5000 UNIT/ML IJ SOLN
5000.0000 [IU] | Freq: Three times a day (TID) | INTRAMUSCULAR | Status: DC
  Administered 2017-12-14 – 2017-12-24 (×31): 5000 [IU] via SUBCUTANEOUS
  Filled 2017-12-13 (×32): qty 1

## 2017-12-13 MED ORDER — FENTANYL CITRATE (PF) 250 MCG/5ML IJ SOLN
INTRAMUSCULAR | Status: AC
  Filled 2017-12-13: qty 5

## 2017-12-13 MED ORDER — PHENYLEPHRINE HCL 10 MG/ML IJ SOLN
INTRAMUSCULAR | Status: AC
  Filled 2017-12-13: qty 1

## 2017-12-13 MED ORDER — SUCCINYLCHOLINE CHLORIDE 200 MG/10ML IV SOSY
PREFILLED_SYRINGE | INTRAVENOUS | Status: DC | PRN
  Administered 2017-12-13: 120 mg via INTRAVENOUS

## 2017-12-13 MED ORDER — LEVALBUTEROL HCL 1.25 MG/0.5ML IN NEBU: 1.2500 mg | INHALATION_SOLUTION | Freq: Once | RESPIRATORY_TRACT | Status: DC

## 2017-12-13 MED ORDER — RANITIDINE HCL 150 MG/10ML PO SYRP
150.0000 mg | ORAL_SOLUTION | Freq: Two times a day (BID) | ORAL | Status: DC
  Administered 2017-12-14: 150 mg
  Filled 2017-12-13: qty 10

## 2017-12-13 MED ORDER — NOREPINEPHRINE BITARTRATE 1 MG/ML IV SOLN
0.0000 ug/min | INTRAVENOUS | Status: DC
  Filled 2017-12-13: qty 4

## 2017-12-13 MED ORDER — SODIUM CHLORIDE 0.9 % IV BOLUS
500.0000 mL | Freq: Once | INTRAVENOUS | Status: AC
  Administered 2017-12-13: 500 mL via INTRAVENOUS

## 2017-12-13 MED ORDER — MIDAZOLAM HCL 2 MG/2ML IJ SOLN
1.0000 mg | INTRAMUSCULAR | Status: DC | PRN
  Administered 2017-12-14 – 2017-12-17 (×10): 1 mg via INTRAVENOUS
  Filled 2017-12-13 (×11): qty 2

## 2017-12-13 MED ORDER — SODIUM CHLORIDE 0.9 % IV SOLN: 250.0000 mL | INTRAVENOUS | Status: DC | PRN

## 2017-12-13 MED ORDER — ROCURONIUM BROMIDE 10 MG/ML (PF) SYRINGE
PREFILLED_SYRINGE | INTRAVENOUS | Status: DC | PRN
  Administered 2017-12-13: 30 mg via INTRAVENOUS

## 2017-12-13 MED ORDER — MIDAZOLAM HCL 5 MG/5ML IJ SOLN
INTRAMUSCULAR | Status: DC | PRN
  Administered 2017-12-13 (×2): 1 mg via INTRAVENOUS

## 2017-12-13 MED ORDER — ACETAMINOPHEN 160 MG/5ML PO SOLN: 650.0000 mg | Freq: Four times a day (QID) | ORAL | Status: DC | PRN

## 2017-12-13 MED ORDER — SODIUM CHLORIDE 0.9 % IV SOLN
25.0000 ug/h | INTRAVENOUS | Status: DC
  Filled 2017-12-13: qty 50

## 2017-12-13 MED ORDER — MIDAZOLAM HCL 2 MG/2ML IJ SOLN
INTRAMUSCULAR | Status: AC
  Administered 2017-12-13
  Filled 2017-12-13: qty 2

## 2017-12-13 MED ORDER — PIPERACILLIN-TAZOBACTAM 3.375 G IVPB
3.3750 g | Freq: Three times a day (TID) | INTRAVENOUS | Status: DC
  Administered 2017-12-14 – 2017-12-20 (×20): 3.375 g via INTRAVENOUS
  Filled 2017-12-13 (×21): qty 50

## 2017-12-13 MED ORDER — PROPOFOL 10 MG/ML IV BOLUS
INTRAVENOUS | Status: AC
  Filled 2017-12-13: qty 20

## 2017-12-13 MED ORDER — PIPERACILLIN-TAZOBACTAM 3.375 G IVPB 30 MIN
3.3750 g | Freq: Once | INTRAVENOUS | Status: AC
  Administered 2017-12-13: 3.375 g via INTRAVENOUS
  Filled 2017-12-13: qty 50

## 2017-12-13 MED ORDER — VANCOMYCIN HCL IN DEXTROSE 1-5 GM/200ML-% IV SOLN: 1000.0000 mg | Freq: Once | INTRAVENOUS | Status: DC

## 2017-12-13 MED ORDER — LIDOCAINE 2% (20 MG/ML) 5 ML SYRINGE
INTRAMUSCULAR | Status: DC | PRN
  Administered 2017-12-13: 50 mg via INTRAVENOUS

## 2017-12-13 MED ORDER — ORAL CARE MOUTH RINSE
15.0000 mL | Freq: Four times a day (QID) | OROMUCOSAL | Status: DC
  Administered 2017-12-14 – 2017-12-19 (×21): 15 mL via OROMUCOSAL

## 2017-12-13 MED ORDER — SODIUM CHLORIDE 0.9 % IV SOLN: INTRAVENOUS | Status: DC | PRN

## 2017-12-13 MED ORDER — ROCURONIUM BROMIDE 10 MG/ML (PF) SYRINGE
PREFILLED_SYRINGE | INTRAVENOUS | Status: AC
  Filled 2017-12-13: qty 5

## 2017-12-13 MED ORDER — ONDANSETRON HCL 4 MG/2ML IJ SOLN
INTRAMUSCULAR | Status: AC
  Filled 2017-12-13: qty 2

## 2017-12-13 MED ORDER — CHLORHEXIDINE GLUCONATE 0.12% ORAL RINSE (MEDLINE KIT)
15.0000 mL | Freq: Two times a day (BID) | OROMUCOSAL | Status: DC
  Administered 2017-12-13 – 2017-12-19 (×12): 15 mL via OROMUCOSAL

## 2017-12-13 MED ORDER — MIDAZOLAM HCL 2 MG/2ML IJ SOLN
INTRAMUSCULAR | Status: AC
  Filled 2017-12-13: qty 2

## 2017-12-13 MED ORDER — IPRATROPIUM-ALBUTEROL 0.5-2.5 (3) MG/3ML IN SOLN
RESPIRATORY_TRACT | Status: AC
  Filled 2017-12-13: qty 3

## 2017-12-13 MED ORDER — IPRATROPIUM-ALBUTEROL 0.5-2.5 (3) MG/3ML IN SOLN
3.0000 mL | Freq: Once | RESPIRATORY_TRACT | Status: AC
  Administered 2017-12-13: 3 mL via RESPIRATORY_TRACT
  Filled 2017-12-13: qty 3

## 2017-12-13 MED ORDER — RANITIDINE HCL 150 MG/10ML PO SYRP
150.0000 mg | ORAL_SOLUTION | Freq: Two times a day (BID) | ORAL | Status: DC
  Administered 2017-12-14 – 2017-12-20 (×14): 150 mg
  Filled 2017-12-13 (×16): qty 10

## 2017-12-13 MED ORDER — SODIUM CHLORIDE 0.9 % IV BOLUS (SEPSIS)
1000.0000 mL | Freq: Once | INTRAVENOUS | Status: AC
  Administered 2017-12-13: 1000 mL via INTRAVENOUS

## 2017-12-13 MED ORDER — INSULIN ASPART 100 UNIT/ML ~~LOC~~ SOLN
1.0000 [IU] | SUBCUTANEOUS | Status: DC
  Administered 2017-12-14: 2 [IU] via SUBCUTANEOUS
  Administered 2017-12-14 (×2): 3 [IU] via SUBCUTANEOUS

## 2017-12-13 MED ORDER — LEVALBUTEROL HCL 1.25 MG/0.5ML IN NEBU
INHALATION_SOLUTION | RESPIRATORY_TRACT | Status: AC
  Filled 2017-12-13: qty 0.5

## 2017-12-13 MED ORDER — ALBUTEROL SULFATE HFA 108 (90 BASE) MCG/ACT IN AERS
INHALATION_SPRAY | RESPIRATORY_TRACT | Status: DC | PRN
  Administered 2017-12-13 (×4): 3 via RESPIRATORY_TRACT

## 2017-12-13 MED ORDER — MIDAZOLAM HCL 2 MG/2ML IJ SOLN: 1.0000 mg | INTRAMUSCULAR | Status: DC | PRN

## 2017-12-13 MED ORDER — PHENYLEPHRINE HCL-NACL 10-0.9 MG/250ML-% IV SOLN
0.0000 ug/min | INTRAVENOUS | Status: DC
  Administered 2017-12-13: 20 ug/min via INTRAVENOUS
  Filled 2017-12-13: qty 250

## 2017-12-13 MED ORDER — ACETAMINOPHEN 650 MG RE SUPP: 650.0000 mg | Freq: Four times a day (QID) | RECTAL | Status: DC | PRN

## 2017-12-13 MED ORDER — ALBUTEROL SULFATE HFA 108 (90 BASE) MCG/ACT IN AERS
INHALATION_SPRAY | RESPIRATORY_TRACT | Status: AC
  Filled 2017-12-13: qty 6.7

## 2017-12-13 MED ORDER — CLINDAMYCIN PHOSPHATE 600 MG/50ML IV SOLN
600.0000 mg | Freq: Once | INTRAVENOUS | Status: AC
  Administered 2017-12-13: 600 mg via INTRAVENOUS
  Filled 2017-12-13: qty 50

## 2017-12-13 MED ORDER — INSULIN ASPART 100 UNIT/ML ~~LOC~~ SOLN: 0.0000 [IU] | Freq: Three times a day (TID) | SUBCUTANEOUS | Status: DC

## 2017-12-13 MED ORDER — LIDOCAINE 2% (20 MG/ML) 5 ML SYRINGE
INTRAMUSCULAR | Status: AC
  Filled 2017-12-13: qty 5

## 2017-12-13 MED ORDER — NOREPINEPHRINE 4 MG/250ML-% IV SOLN
0.0000 ug/min | INTRAVENOUS | Status: DC
  Administered 2017-12-13: 2 ug/min via INTRAVENOUS
  Administered 2017-12-14: 19 ug/min via INTRAVENOUS
  Administered 2017-12-14: 18 ug/min via INTRAVENOUS
  Administered 2017-12-14: 20 ug/min via INTRAVENOUS
  Filled 2017-12-13 (×4): qty 250

## 2017-12-13 MED ORDER — SUCCINYLCHOLINE CHLORIDE 200 MG/10ML IV SOSY
PREFILLED_SYRINGE | INTRAVENOUS | Status: AC
  Filled 2017-12-13: qty 10

## 2017-12-13 MED ORDER — PHENYLEPHRINE 40 MCG/ML (10ML) SYRINGE FOR IV PUSH (FOR BLOOD PRESSURE SUPPORT)
PREFILLED_SYRINGE | INTRAVENOUS | Status: AC
  Filled 2017-12-13: qty 10

## 2017-12-13 MED ORDER — FENTANYL CITRATE (PF) 100 MCG/2ML IJ SOLN
50.0000 ug | INTRAMUSCULAR | Status: DC | PRN
  Administered 2017-12-13 (×2): 50 ug via INTRAVENOUS
  Filled 2017-12-13 (×2): qty 2

## 2017-12-13 MED ORDER — DEXAMETHASONE SODIUM PHOSPHATE 10 MG/ML IJ SOLN
INTRAMUSCULAR | Status: AC
  Filled 2017-12-13: qty 1

## 2017-12-13 MED ORDER — PROPOFOL 10 MG/ML IV BOLUS
INTRAVENOUS | Status: DC | PRN
  Administered 2017-12-13: 100 mg via INTRAVENOUS

## 2017-12-13 MED ORDER — FENTANYL 2500MCG IN NS 250ML (10MCG/ML) PREMIX INFUSION
25.0000 ug/h | INTRAVENOUS | Status: DC
  Administered 2017-12-13: 50 ug/h via INTRAVENOUS
  Administered 2017-12-14: 250 ug/h via INTRAVENOUS
  Administered 2017-12-15 – 2017-12-16 (×6): 400 ug/h via INTRAVENOUS
  Administered 2017-12-16: 350 ug/h via INTRAVENOUS
  Administered 2017-12-16 – 2017-12-17 (×2): 400 ug/h via INTRAVENOUS
  Filled 2017-12-13 (×12): qty 250

## 2017-12-13 MED ORDER — FENTANYL BOLUS VIA INFUSION
25.0000 ug | INTRAVENOUS | Status: DC | PRN
  Administered 2017-12-13 – 2017-12-16 (×4): 25 ug via INTRAVENOUS
  Filled 2017-12-13: qty 25

## 2017-12-13 MED ORDER — 0.9 % SODIUM CHLORIDE (POUR BTL) OPTIME
TOPICAL | Status: DC | PRN
  Administered 2017-12-13: 1000 mL

## 2017-12-13 MED ORDER — HYDROMORPHONE HCL 1 MG/ML IJ SOLN: 0.2500 mg | INTRAMUSCULAR | Status: DC | PRN

## 2017-12-13 MED ORDER — SODIUM CHLORIDE 0.9 % IV SOLN
INTRAVENOUS | Status: DC | PRN
  Administered 2017-12-13 (×2): via INTRAVENOUS

## 2017-12-13 MED ORDER — PROMETHAZINE HCL 25 MG/ML IJ SOLN: 6.2500 mg | INTRAMUSCULAR | Status: DC | PRN

## 2017-12-13 MED ORDER — PROPOFOL 1000 MG/100ML IV EMUL
5.0000 ug/kg/min | INTRAVENOUS | Status: DC
  Administered 2017-12-13: 5 ug/kg/min via INTRAVENOUS
  Filled 2017-12-13: qty 100

## 2017-12-13 MED ORDER — DEXTROSE 5 % IV SOLN
INTRAVENOUS | Status: DC | PRN
  Administered 2017-12-13: 35 ug/min via INTRAVENOUS

## 2017-12-13 MED ORDER — ALBUMIN HUMAN 5 % IV SOLN
INTRAVENOUS | Status: DC | PRN
  Administered 2017-12-13: 21:00:00 via INTRAVENOUS

## 2017-12-13 MED ORDER — DOCUSATE SODIUM 50 MG/5ML PO LIQD
100.0000 mg | Freq: Two times a day (BID) | ORAL | Status: DC
  Administered 2017-12-14 – 2017-12-16 (×7): 100 mg
  Filled 2017-12-13 (×7): qty 10

## 2017-12-13 MED ORDER — SODIUM BICARBONATE 8.4 % IV SOLN
100.0000 meq | Freq: Once | INTRAVENOUS | Status: AC
  Administered 2017-12-14: 100 meq via INTRAVENOUS
  Filled 2017-12-13: qty 50

## 2017-12-13 MED ORDER — SODIUM CHLORIDE 0.9 % IV BOLUS
1000.0000 mL | Freq: Once | INTRAVENOUS | Status: AC
Start: 2017-12-13 — End: 2017-12-13
  Administered 2017-12-13: 1000 mL via INTRAVENOUS

## 2017-12-13 MED ORDER — ONDANSETRON HCL 4 MG/2ML IJ SOLN
4.0000 mg | Freq: Four times a day (QID) | INTRAMUSCULAR | Status: DC | PRN
  Administered 2017-12-25: 4 mg via INTRAVENOUS

## 2017-12-13 MED ORDER — LEVALBUTEROL HCL 0.63 MG/3ML IN NEBU
INHALATION_SOLUTION | RESPIRATORY_TRACT | Status: AC
  Filled 2017-12-13: qty 3

## 2017-12-13 MED ORDER — ALBUMIN HUMAN 5 % IV SOLN
25.0000 g | Freq: Once | INTRAVENOUS | Status: DC
  Filled 2017-12-13: qty 500

## 2017-12-13 MED ORDER — VANCOMYCIN HCL 10 G IV SOLR
1500.0000 mg | Freq: Once | INTRAVENOUS | Status: AC
  Administered 2017-12-13: 1500 mg via INTRAVENOUS
  Filled 2017-12-13: qty 1500

## 2017-12-13 SURGICAL SUPPLY — 34 items
ADH SKN CLS APL DERMABOND .7 (GAUZE/BANDAGES/DRESSINGS)
BAG URINE DRAINAGE (UROLOGICAL SUPPLIES) ×1 IMPLANT
BLADE HEX COATED 2.75 (ELECTRODE) ×2 IMPLANT
BNDG GAUZE ELAST 4 BULKY (GAUZE/BANDAGES/DRESSINGS) ×2 IMPLANT
BRIEF STRETCH FOR OB PAD LRG (UNDERPADS AND DIAPERS) ×1 IMPLANT
CATH FOLEY 2WAY SLVR  5CC 16FR (CATHETERS) ×1
CATH FOLEY 2WAY SLVR 5CC 16FR (CATHETERS) IMPLANT
COVER SURGICAL LIGHT HANDLE (MISCELLANEOUS) ×2 IMPLANT
DERMABOND ADVANCED (GAUZE/BANDAGES/DRESSINGS)
DERMABOND ADVANCED .7 DNX12 (GAUZE/BANDAGES/DRESSINGS) ×1 IMPLANT
DRAIN PENROSE 18X1/4 LTX STRL (WOUND CARE) ×1 IMPLANT
DRAPE LAPAROTOMY T 98X78 PEDS (DRAPES) ×2 IMPLANT
DRAPE UNDER BUTCK 40X44W/POUCH (DRAPES) ×1 IMPLANT
ELECT REM PT RETURN 15FT ADLT (MISCELLANEOUS) ×2 IMPLANT
GLOVE BIOGEL M 8.0 STRL (GLOVE) ×2 IMPLANT
GOWN STRL REUS W/ TWL XL LVL3 (GOWN DISPOSABLE) ×1 IMPLANT
GOWN STRL REUS W/TWL XL LVL3 (GOWN DISPOSABLE) ×4 IMPLANT
KIT BASIN OR (CUSTOM PROCEDURE TRAY) ×2 IMPLANT
NEEDLE HYPO 22GX1.5 SAFETY (NEEDLE) IMPLANT
NS IRRIG 1000ML POUR BTL (IV SOLUTION) ×2 IMPLANT
PACK GENERAL/GYN (CUSTOM PROCEDURE TRAY) ×2 IMPLANT
PAD ABD 8X10 STRL (GAUZE/BANDAGES/DRESSINGS) ×1 IMPLANT
SUPPORT SCROTAL LG STRP (MISCELLANEOUS) ×1 IMPLANT
SUT CHROMIC 3 0 SH 27 (SUTURE) ×1 IMPLANT
SUT CHROMIC 4 0 SH 27 (SUTURE) IMPLANT
SUT MNCRL AB 4-0 PS2 18 (SUTURE) IMPLANT
SUT PROLENE 4 0 RB 1 (SUTURE)
SUT PROLENE 4-0 RB1 .5 CRCL 36 (SUTURE) ×1 IMPLANT
SUT VIC AB 2-0 UR5 27 (SUTURE) IMPLANT
SUT VIC AB 4-0 PS2 27 (SUTURE) ×1 IMPLANT
SUT VICRYL 0 TIES 12 18 (SUTURE) ×1 IMPLANT
SYR CONTROL 10ML LL (SYRINGE) IMPLANT
TOWEL OR 17X26 10 PK STRL BLUE (TOWEL DISPOSABLE) ×2 IMPLANT
WATER STERILE IRR 1000ML POUR (IV SOLUTION) ×2 IMPLANT

## 2017-12-13 NOTE — Progress Notes (Signed)
Placed patient on 10L HFNC for SATs of 91%

## 2017-12-13 NOTE — Consult Note (Signed)
Urology Consult  Consulting MD: Alona Bene, MD  CC: Fournier's gangrene  HPI: This is a 67year old male presenting to the emergency room at [facility] earlier today with a severe scrotal infection.  The patient saw Dr. Sherryll Burger, his PCP earlier in the day with a several day history of a painful swollen area in the posterior scrotal region.  This has progressed over the past few days, involving more skin, with pain and now foul odor.  The patient is not diabetic.  He does have COPD as well as GERD.  He has not been on any recent medications other than oxycodone for chronic pain and Tylenol.  He denies long-standing history of scrotal infections.  He has not had severe problems with urination. Evaluation in the emergency room at any pen revealed the patient to have an elevated lactic acid level.  He is transferred to [facility] for further management. PMH: Past Medical History:  Diagnosis Date  . Anxiety   . Arthritis   . COPD (chronic obstructive pulmonary disease) (HCC)   . GERD (gastroesophageal reflux disease)   . Shortness of breath dyspnea     PSH: Past Surgical History:  Procedure Laterality Date  . APPENDECTOMY    . BACK SURGERY     x5  . CARDIAC CATHETERIZATION  06   neg  . CERVICAL DISC SURGERY    . PLANTAR FASCIA SURGERY Bilateral   . shoulders Bilateral    rotator cuff  . SUBMANDIBULAR GLAND EXCISION Left 03/12/2015   Procedure: LEFT SUBMANDIBULAR GLAND RESECTION;  Surgeon: Christia Reading, MD;  Location: [facility] OR;  Service: ENT;  Laterality: Left;  . TONSILLECTOMY      Allergies: Allergies  Allergen Reactions  . Morphine And Related Itching    Medications:  (Not in a hospital admission)   Social History: Social History   Socioeconomic History  . Marital status: Single    Spouse name: Not on file  . Number of children: Not on file  . Years of education: Not on file  . Highest education level: Not on file  Occupational History  . Not on file  Social  Needs  . Financial resource strain: Not on file  . Food insecurity:    Worry: Not on file    Inability: Not on file  . Transportation needs:    Medical: Not on file    Non-medical: Not on file  Tobacco Use  . Smoking status: Current Every Day Smoker    Packs/day: 1.00    Years: 50.00    Pack years: 50.00    Types: Cigarettes  . Smokeless tobacco: Never Used  Substance and Sexual Activity  . Alcohol use: No    Comment: quit alcohol 80's  . Drug use: No  . Sexual activity: Not on file  Lifestyle  . Physical activity:    Days per week: Not on file    Minutes per session: Not on file  . Stress: Not on file  Relationships  . Social connections:    Talks on phone: Not on file    Gets together: Not on file    Attends religious service: Not on file    Active member of club or organization: Not on file    Attends meetings of clubs or organizations: Not on file    Relationship status: Not on file  . Intimate partner violence:    Fear of current or ex partner: Not on file    Emotionally abused: Not on file  Physically abused: Not on file    Forced sexual activity: Not on file  Other Topics Concern  . Not on file  Social History Narrative  . Not on file    Family History: History reviewed. No pertinent family history.  Review of Systems: Positive: Mild urinary frequency.  Scrotal/perineal swelling/pain/foul odor.  Shortness of breath Negative:   A further 10 point review of systems was negative except what is listed in the HPI.  Physical Exam: @VITALS2 @ General: No acute distress.  Awake. Head:  Normocephalic.  Atraumatic. ENT:  EOMI.  Mucous membranes moist Neck:  Supple.  No lymphadenopathy. CV:  Mild shortness of breath.  Patient is wearing nasal cannula oxygen Abdomen: Soft.  Non-tender to palpation.  Obese Skin:  Normal turgor.  No visible rash. Extremity: No gross deformity of bilateral upper extremities.  No gross deformity of    bilateral lower  extremities. Neurologic: Alert. Appropriate mood.  Penis:              Significant anterior scrotal edema.  No significant erythema anteriorly.  Posterior scrotum involved with necrotic area approximately 4 x 4 cm in size.  Fluctuant.  Foul-smelling.  No obvious drainage.  This process extends approximately 1-2 cm anterior to the anus.  There is induration of the infrapubic area.  There is no crepitus noted.   Studies:  Recent Labs    12/13/17 1359  HGB 11.1*  WBC 26.1*  PLT 229    Recent Labs    12/13/17 1359  NA 129*  K 4.9  CL 94*  CO2 16*  BUN 64*  CREATININE 4.24*  CALCIUM 8.5*  GFRNONAA 13*  GFRAA 15*  I reviewed the patient's CT.  It does not go deep into the scrotal area.  There is some gas along the left inguinal region.  No results for input(s): INR, APTT in the last 72 hours.  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  Plan: I spoken to the patient and his family about the need for emergent debridement of this process.  I let them know that sometimes more skin/subcutaneous tissue is taken as intraoperatively there can be rapid progression of the process despite having normal superficial appearance.  They understand the grave nature of this process, and the need for emergent wide debridement, with possible repeat procedures done down the road.  They agree to proceed emergently.  At the patient has completed his procedure, I will have the hospital service help manage his medical issues.    Pager:[phone]

## 2017-12-13 NOTE — ED Notes (Signed)
[facility]

## 2017-12-13 NOTE — ED Notes (Addendum)
Placed call to [facility] for [facility]. Emergency Traffic per Dr. Jacqulyn BathLong

## 2017-12-13 NOTE — Consult Note (Signed)
PULMONARY / CRITICAL CARE MEDICINE   Name: Patrick Aguilar MRN: [id number] DOB: [birth year]    ADMISSION DATE:  12/13/2017 CONSULTATION DATE: 12/13/17  REFERRING MD: Dr Retta Diones (Urology)  CHIEF COMPLAINT: Fournier's gangrene, Septic shock, VDRF, ARDS  HISTORY OF PRESENT ILLNESS:   66yoM with hx COPD, HTN, GERD, OA, and Anxiety, presented to [facility] ER on 4/3 AM c/o SOB x 2 weeks and scrotal pain. He was found to have acute hypoxic respiratory failure and fournier's gangrene. He was transferred to [facility] where he underwent operative debridement. Postop he returned to the ICU, intubated on max vent settings with acute hypoxic respiratory failure and ARDS and in shock on vasopressors. Patient's daughter is at his bedside. Patient is sedated.   PAST MEDICAL HISTORY :  He  has a past medical history of Anxiety, Arthritis, COPD (chronic obstructive pulmonary disease) (HCC), GERD (gastroesophageal reflux disease), and Shortness of breath dyspnea.  PAST SURGICAL HISTORY: He  has a past surgical history that includes Cardiac catheterization (06); Back surgery; Cervical disc surgery; shoulders (Bilateral); Tonsillectomy; Appendectomy; Plantar fascia surgery (Bilateral); and Submandibular gland excision (Left, 03/12/2015).  Allergies  Allergen Reactions  . Morphine And Related Itching    No current facility-administered medications on file prior to encounter.    Current Outpatient Medications on File Prior to Encounter  Medication Sig  . albuterol (PROVENTIL) (2.5 MG/3ML) 0.083% nebulizer solution Take 2.5 mg by nebulization every 6 (six) hours as needed for wheezing or shortness of breath.   Marland Kitchen amLODipine (NORVASC) 5 MG tablet Take 5 mg by mouth daily.   Marland Kitchen atenolol-chlorthalidone (TENORETIC) 50-25 MG per tablet Take 1 tablet by mouth daily.   . benazepril (LOTENSIN) 20 MG tablet Take 20 mg by mouth daily.   . diazepam (VALIUM) 10 MG tablet Take 10 mg by mouth 2 (two) times daily.   Marland Kitchen doxazosin (CARDURA) 8 MG tablet Take 8 mg by mouth daily.  Marland Kitchen FLUoxetine (PROZAC) 20 MG capsule Take 20 mg by mouth daily.   Marland Kitchen gabapentin (NEURONTIN) 800 MG tablet Take 800 mg by mouth 3 (three) times daily.   Marland Kitchen HYDROcodone-acetaminophen (NORCO) 10-325 MG per tablet Take 1 tablet by mouth 3 (three) times daily as needed (pain).  . meloxicam (MOBIC) 7.5 MG tablet Take 7.5 mg by mouth 2 (two) times daily. Take with food or milk  . potassium chloride SA (K-DUR,KLOR-CON) 20 MEQ tablet Take 40 mEq by mouth daily.   . ranitidine (ZANTAC) 150 MG tablet Take 150 mg by mouth 2 (two) times daily.   . simvastatin (ZOCOR) 20 MG tablet Take 20 mg by mouth daily.    FAMILY HISTORY:  His has no family status information on file.   SOCIAL HISTORY: He  reports that he has been smoking cigarettes.  He has a 50.00 pack-year smoking history. He has never used smokeless tobacco. He reports that he does not drink alcohol or use drugs.  REVIEW OF SYSTEMS:   Review of Systems  Unable to perform ROS: Critical illness   SUBJECTIVE:  Intubated and sedated   VITAL SIGNS: BP 113/62   Pulse 72   Temp (!) 97.4 F (36.3 C) (Axillary)   Resp (!) 28   Ht 5\' 8"  (1.727 m)   Wt 94.1 kg (207 lb 7.3 oz)   SpO2 91%   BMI 31.54 kg/m   HEMODYNAMICS:  Levophed @   VENTILATOR SETTINGS: Vent Mode: PCV FiO2 (%):  [100 %] 100 % Set Rate:  [18 bmp-28  bmp] 28 bmp Vt Set:  [580 mL] 580 mL PEEP:  [12 cmH20-16 cmH20] 16 cmH20 Plateau Pressure:  [21 cmH20] 21 cmH20  INTAKE / OUTPUT: I/O last 3 completed shifts: In: 3500 [IV Piggyback:3500] Out: 700 [Urine:700]  PHYSICAL EXAMINATION: General: WDWN Adult male, intubated and sedated, critically ill Neuro: PERRL, withdraws to pain, not obeying commands, is moving all extremities HEENT: Orally intubated; MM moist, OP clear Cardiovascular: RRR no m/r/g Lungs: CTA b/l Abdomen: Soft Obese NTND, BS absent Musculoskeletal: no LE edema  GU: foley catheter in  place draining dark yellow urine; significant scotal edema covered with clean dressings; dressings not removed during my exam to examine the wound  LABS:  BMET Recent Labs  Lab 12/13/17 1359  NA 129*  K 4.9  CL 94*  CO2 16*  BUN 64*  CREATININE 4.24*  GLUCOSE 277*   Electrolytes Recent Labs  Lab 12/13/17 1359  CALCIUM 8.5*   CBC Recent Labs  Lab 12/13/17 1359  WBC 26.1*  HGB 11.1*  HCT 32.2*  PLT 229   Coag's No results for input(s): APTT, INR in the last 168 hours.  Sepsis Markers Recent Labs  Lab 12/13/17 1426 12/13/17 1626  LATICACIDVEN 5.19* 2.32*   ABG Recent Labs  Lab 12/13/17 2241  PHART 7.190*  PCO2ART 45.0  PO2ART 68.3*   Liver Enzymes Recent Labs  Lab 12/13/17 1359  AST 47*  ALT 55  ALKPHOS 183*  BILITOT 1.0  ALBUMIN 2.5*   Cardiac Enzymes Recent Labs  Lab 12/13/17 1359  TROPONINI <0.03   Glucose Recent Labs  Lab 12/13/17 2323  GLUCAP 181*   Imaging Ct Abdomen Pelvis Wo Contrast  Result Date: 12/13/2017 CLINICAL DATA:  67 y/o M; possible abscess to the scrotal area with tenderness and swelling. EXAM: CT ABDOMEN AND PELVIS WITHOUT CONTRAST TECHNIQUE: Multidetector CT imaging of the abdomen and pelvis was performed following the standard protocol without IV contrast. COMPARISON:  None. FINDINGS: Lower chest: Ground-glass opacities predominantly in the left lung base with peripheral sparing. Hepatobiliary: No focal liver abnormality is seen. No gallstones, gallbladder wall thickening, or biliary dilatation. Pancreas: Unremarkable. No pancreatic ductal dilatation or surrounding inflammatory changes. Spleen: Normal in size without focal abnormality. Adrenals/Urinary Tract: Bilateral adrenal hypertrophy. No focal subcentimeter left kidney interpolar exophytic lesion measuring 42 HU, probably hemorrhagic cyst. No other focal kidney lesion identified. No hydronephrosis. Normal bladder. Stomach/Bowel: Stomach is within normal limits. Appendix  not identified, no pericecal inflammation. No evidence of bowel wall thickening, distention, or inflammatory changes. Vascular/Lymphatic: Aortic atherosclerosis. No enlarged abdominal or pelvic lymph nodes. Reproductive: Prostate is unremarkable. Severe scrotal wall thickening and edema and extensive soft tissue emphysema within the scrotal compartment extending to base of penis. Other: No abdominal wall hernia or abnormality. No abdominopelvic ascites. Musculoskeletal: Grade 1 L5-S1 anterolisthesis. L5-S1 posterior instrumented fusion, no periprosthetic lucency or fracture identified. Bilateral L5 pars defects. Severe lumbar spine spondylosis with discogenic degenerative changes greatest at the L2-L4 levels. High-grade bilateral foraminal stenosis at the L2 through S1 levels and multilevel high-grade canal stenosis. IMPRESSION: 1. Extensive scrotal edema with soft tissue emphysema extending to base of penis. Findings compatible with Fournier gangrene. 2. Ground-glass opacities greatest in left lung base partially visualized with peripheral sparing may represent pneumonitis or alveolar pulmonary edema. 3. Severe spondylosis of lumbar spine with multilevel high-grade foraminal and canal stenosis. These results were called by telephone at the time of interpretation on 12/13/2017 at 4:20 pm to Dr. Alona Bene , who verbally acknowledged these results.  Electronically Signed   By: Mitzi Hansen M.D.   On: 12/13/2017 16:21   Dg Chest 1 View  Result Date: 12/13/2017 CLINICAL DATA:  NG and ET tube placement. EXAM: CHEST  1 VIEW COMPARISON:  Chest x-ray from same day at 15:20. FINDINGS: Endotracheal tube in place with the tip approximately 1 cm above the level of the carina. Interval placement of an NG tube with the tip in the gastric fundus. Stable cardiomegaly with left greater than right perihilar airspace disease. No pleural effusion or pneumothorax. No acute osseous abnormality. IMPRESSION: 1. Endotracheal  tube in place with the tip 1 cm above the level of the carina. Recommend retraction by 2-3 cm. 2. NG tube within the stomach. 3. Unchanged extensive left greater than right airspace disease. Electronically Signed   By: Obie Dredge M.D.   On: 12/13/2017 22:54   Dg Abd 1 View  Result Date: 12/13/2017 CLINICAL DATA:  Life support line placement. EXAM: ABDOMEN - 1 VIEW COMPARISON:  CT abdomen and pelvis December 13, 2017 FINDINGS: No is a gastric tube looped in proximal stomach with distal tip projecting in gastric cardia, side port past GE junction region. Included bowel gas pattern is nondilated and nonobstructive. No intra-abdominal mass effect or pathologic calcifications. L5-S1 PLIF. Severe degenerative change of the lumbar spine. IMPRESSION: Nasogastric tube tip projects in proximal stomach. Electronically Signed   By: Awilda Metro M.D.   On: 12/13/2017 22:51   Dg Chest Portable 1 View  Result Date: 12/13/2017 CLINICAL DATA:  Short of breath EXAM: PORTABLE CHEST 1 VIEW COMPARISON:  None. FINDINGS: Severe diffuse bilateral airspace disease. Cardiac enlargement. No pleural effusion. Cervical fusion hardware IMPRESSION: Extensive diffuse bilateral airspace disease most likely pulmonary edema. Electronically Signed   By: Marlan Palau M.D.   On: 12/13/2017 15:35   CULTURES: Blood cultures (4/3): pending Operative culture (4/3): pending Sputum culture: ordered UA: ordered  ANTIBIOTICS: Vancomycin 4/3 >> Zosyn 4/3 >> Clindamycin 4/3 >>  SIGNIFICANT EVENTS: 4/3: presented to ER with SOB and Scrotal edema, found to have septic shock due to fournier's gangrene, now s/p debridement; VDRF with ARDS  LINES/TUBES: PIV's ETT 4/3 >> Foley 4/3 >> OG tube 4/3 >>  DISCUSSION: 66yoM with hx COPD, HTN, GERD, OA, and Anxiety, admitted with acute hypoxic respiratory failure with ARDS, AKI, and Septic shock due to fournier's gangrene.  ASSESSMENT / PLAN:  PULMONARY 1. Acute Hypoxic Respiratory  Failure; ARDS; COPD - CXR on my review shows bilateral infiltrates possibly related to pulmonary edema vs pneumonia vs ARDS from the sepsis itself.  - ABG is consistent with ARDS - on initial CXR, the ETT was at the carina; I retracted it 1cm from 26cm to 25cm and informed RT.  - Continue Mechanical ventilation; was switched to ACPC just prior to my examining patient; this is not a good vent mode as we will have more difficulty controlling tidal volumes and achieving low tidal volume ventilation. Also as the infiltrates begin to improve as patient recovers, his lungs will become more compliant and a pressure control ventilation mode would then overventilate him. Therefore switched him back to volume control. ABG shows combination metabolic and respiratory acidosis (mostly metabolic) and severe hypoxia.  - Currently on 16 PEEP, 100% FIO2 with good Pox. However Pox on vent with good waveform is 98% which doesn't quite match the PaO2 of 69 achieved on ABG off the Aline. Repeat ABG in AM - Gave 2 amps Bicarb for treatment of the metabolic acidosis, which is  relate to his AKI; lactate is only 0.9. Increased RR from 20 to 22. He is unable to tolerated RR higher than 22 due to autopeep. Adjusted I:E ratio to give more time to exhale. Repeat ABG in AM - Duonebs q4hrs. Not wheezing so will not add steroids at this time.  - obtain TTE  CARDIOVASCULAR 1. Shock; Hx HTN: - hold home antihypertensives in setting of shock  RENAL 1. AKI; Metabolic acidosis - creatinine 4.24 on admission, up from baseline of 1.11 (in 2016); repeat creatinine now down to 2.90.  - continue foley catheter; good UOP. Avoid nephrotoxic agents - CT Abdomen showed no hydronephrosis - metabolic acidosis initially due to combination of lactic acidosis (now resolved) and AKI; now residual acidosis due to the AKI alone. Gave 2 amps Bicarb IV.  - check UA and Urine lytes  GASTROINTESTINAL 1. Hx GERD - NPO; GI  prophylaxis  HEMATOLOGIC 1. Anemia: - Hgb 11.1 on admission, down from baseline of 13.7 (in 2016); repeat Hgb now 9.7 likely combination of dilutional and surgical blood loss. No active bleeding. Continue to monitor.  - Heparin for DVT prophylaxis; SCD's  INFECTIOUS 1. Septic shock due to Fournier's Gangrene - s/p surgical debridement - s/p 3.5L IVF bolus given on admission, another 0.5L IVF bolus given on arrival to ICU. CVP is 20 so will hold off on further IVF's. IJ was very large and plump on ultrasound. Continue levophed; if max on levophed then 2nd vasopressor will be Vasopressin.  - pancultured; procalcitonin is elevated at 12.99; continue to trend it daily. Lactate initially 5.19 on presentation to the ER, now resolved with lactate 0.99.  - Check cortisol level - Continue Vancomycin, Zosyn, and Clindamycin - check MRSA nares PCR - follow-up cultures - Consider ID consult in AM - Appreciate Urology assistance   ENDOCRINE 1. Hyperglycemia - no hx of DM; BG elevated in 200's. Start SSI NEUROLOGIC No active issues; continue Fentanyl gtt for vent tolerance    FAMILY  - Updates: updated patient's daughter who is at the bedside.  - Inter-disciplinary family meet or Palliative Care meeting due by: 12/20/17  60 minutes critical care time  Milana ObeyKathleen Hammonds, MD  Pulmonary and Critical Care Medicine [facility] Pager: [phone]  12/13/2017, 11:53 PM

## 2017-12-13 NOTE — ED Notes (Signed)
I have just spoken with Dr. Retta Dionesahlstedt, who tells me [facility] is at [facility], and will see pt. Here asap.

## 2017-12-13 NOTE — ED Notes (Signed)
Bladder Scanner =395 ml urine

## 2017-12-13 NOTE — ED Notes (Signed)
Pts scrotum and penis swollen. Scrotum approx the size of a large grapefruit. Noted to have 2 black necrotic area at the base of scrotum. Very foul odor with watery drainage

## 2017-12-13 NOTE — Progress Notes (Signed)
eLink Physician-Brief Progress Note Patient Name: Patrick FredericksonCharles W Aguilar DOB: 1950/12/11 MRN: [id number]   Date of Service  12/13/2017  HPI/Events of Note  Post-op debridement of Fournier's gangrene. Septic shock and acute kidney injury. Pt also with bilateral lung infiltrates suggestive of ARDS.  eICU Interventions  Iv fluid resuscitation per sepsis pathway, continue triple antibiotic coverage, ARDS ventilation protocol. Wean Neo as tolerated, SS Humalog insulin regimen for hyperglycemia, PAD protocol sedation with propofol/ Fentanyl        Patrick Aguilar Patrick Aguilar 12/13/2017, 10:21 PM

## 2017-12-13 NOTE — ED Notes (Signed)
Sats fluctuating from low 80's to upper 80's. Dr Jacqulyn BathLong aware . Gave order to give duoneb

## 2017-12-13 NOTE — ED Notes (Signed)
Report given to [facility] by Tilman NeatJennifer Kendrick,RN. [facility] will be to [facility] ED in approximately 20 minutes.   Pt aunt and family friend aware of pt disposition per verbal permission of pt.

## 2017-12-13 NOTE — Consult Note (Signed)
[facility] Consult  Reason for Consult: Fournier's gangrene  Referring Physician:  Dr.Long   Chief Complaint    Shortness of Breath      Patrick Aguilar is a 67 y.o. male.  HPI: Patrick Aguilar is a 67 yo who has a history of COPD, not on O2, GERD, arthritis who reports having a "boil" in his perineum area starting Friday when he saw Dr. Manuella Ghazi, PCP in [place].  He was prescribed bactrim, and told to follow up the next week. Over the weekend, he started to have swelling of his scrotum and penis, and noted that the area was more tender. He called Dr. Trena Platt office with plans to follow up Thursday, but given the pain and swelling saw Dr. Manuella Ghazi today and was sent to the ED for further evaluation.    He reported that he has had some chills but no fever. No prior boils or infections in his groin, and he does not have diabetes that he knows of at this time.   Past Medical History:  Diagnosis Date  . Anxiety   . Arthritis   . COPD (chronic obstructive pulmonary disease) ([place])   . GERD (gastroesophageal reflux disease)   . Shortness of breath dyspnea     Past Surgical History:  Procedure Laterality Date  . APPENDECTOMY    . BACK SURGERY     x5  . CARDIAC CATHETERIZATION  06   neg  . CERVICAL DISC SURGERY    . PLANTAR FASCIA SURGERY Bilateral   . shoulders Bilateral    rotator cuff  . SUBMANDIBULAR GLAND EXCISION Left 03/12/2015   Procedure: LEFT SUBMANDIBULAR GLAND RESECTION;  Surgeon: Melida Quitter, MD;  Location: [facility];  Service: ENT;  Laterality: Left;  . TONSILLECTOMY      History reviewed. No pertinent family history.  Social History   Tobacco Use  . Smoking status: Current Every Day Smoker    Packs/day: 1.00    Years: 50.00    Pack years: 50.00    Types: Cigarettes  . Smokeless tobacco: Never Used  Substance Use Topics  . Alcohol use: No    Comment: quit alcohol 80's  . Drug use: No    Medications: I have reviewed the patient's current  medications. Current Facility-Administered Medications  Medication Dose Route Frequency Provider Last Rate Last Dose  . clindamycin (CLEOCIN) IVPB 600 mg  600 mg Intravenous Once Long, Wonda Olds, MD      . sodium chloride 0.9 % bolus 1,000 mL  1,000 mL Intravenous Once Long, Wonda Olds, MD      . vancomycin (VANCOCIN) 1,500 mg in sodium chloride 0.9 % 500 mL IVPB  1,500 mg Intravenous Once Margette Fast, MD 250 mL/hr at 12/13/17 1513 1,500 mg at 12/13/17 1513   Current Outpatient Medications  Medication Sig Dispense Refill Last Dose  . albuterol (PROVENTIL) (2.5 MG/3ML) 0.083% nebulizer solution Take 2.5 mg by nebulization every 6 (six) hours as needed for wheezing or shortness of breath.   0 03/11/2015 at Unknown time  . amLODipine (NORVASC) 5 MG tablet Take 5 mg by mouth daily.   0 03/12/2015 at 0500  . atenolol-chlorthalidone (TENORETIC) 50-25 MG per tablet Take 1 tablet by mouth daily.   0 03/12/2015 at 0500  . benazepril (LOTENSIN) 20 MG tablet Take 20 mg by mouth daily.   0 03/11/2015 at Unknown time  . diazepam (VALIUM) 10 MG tablet Take 10 mg by mouth 2 (two) times daily.  0 03/11/2015  at Unknown time  . doxazosin (CARDURA) 8 MG tablet Take 8 mg by mouth daily.  1 03/11/2015 at Unknown time  . FLUoxetine (PROZAC) 20 MG capsule Take 20 mg by mouth daily.   1 03/12/2015 at 0500  . gabapentin (NEURONTIN) 800 MG tablet Take 800 mg by mouth 3 (three) times daily.   1 03/12/2015 at 0500  . HYDROcodone-acetaminophen (NORCO) 10-325 MG per tablet Take 1 tablet by mouth 3 (three) times daily as needed (pain).   03/12/2015 at 0500  . meloxicam (MOBIC) 7.5 MG tablet Take 7.5 mg by mouth 2 (two) times daily. Take with food or milk  0 Past Week at Unknown time  . potassium chloride SA (K-DUR,KLOR-CON) 20 MEQ tablet Take 40 mEq by mouth daily.   1 03/11/2015 at Unknown time  . ranitidine (ZANTAC) 150 MG tablet Take 150 mg by mouth 2 (two) times daily.   0 03/12/2015 at 0500  . simvastatin (ZOCOR) 20 MG tablet Take  20 mg by mouth daily.   0 03/12/2015 at  0500   Allergies: Allergies  Allergen Reactions  . Morphine And Related Itching    ROS:  A comprehensive review of systems was negative except for: Constitutional: positive for chills Respiratory: positive for SOB Genitourinary: positive for scrotal swelling and pain, swelling and "boil" in perineum  Blood pressure (!) 106/59, pulse (!) 109, temperature 98 F (36.7 C), temperature source Oral, resp. rate 14, height '5\' 8"'  (1.727 m), weight 208 lb (94.3 kg), SpO2 (!) 79 %. Physical Exam  Constitutional: He is oriented to person, place, and time and well-developed, well-nourished, and in no distress.  HENT:  Head: Normocephalic.  Eyes: Pupils are equal, round, and reactive to light.  Neck: Normal range of motion.  Cardiovascular: Normal rate.  Pulmonary/Chest: Effort normal.  Abdominal: Soft. He exhibits no distension. There is no tenderness.  Genitourinary:  Genitourinary Comments: Scrotum with significant swelling and erythema, suprapubic area with erythema, penis swollen and erythematous, necrotic ulcer on the base of the scrotum, not extending to the anal region and not extending to the legs laterally  Musculoskeletal: Normal range of motion. He exhibits no edema.  Neurological: He is alert and oriented to person, place, and time.  Skin: Skin is warm and dry.  Psychiatric: Mood, memory, affect and judgment normal.  Vitals reviewed.       Results: Results for orders placed or performed during the hospital encounter of 12/13/17 (from the past 48 hour(s))  Comprehensive metabolic panel     Status: Abnormal   Collection Time: 12/13/17  1:59 PM  Result Value Ref Range   Sodium 129 (L) 135 - 145 mmol/L   Potassium 4.9 3.5 - 5.1 mmol/L   Chloride 94 (L) 101 - 111 mmol/L   CO2 16 (L) 22 - 32 mmol/L   Glucose, Bld 277 (H) 65 - 99 mg/dL   BUN 64 (H) 6 - 20 mg/dL   Creatinine, Ser 4.24 (H) 0.61 - 1.24 mg/dL   Calcium 8.5 (L) 8.9 - 10.3  mg/dL   Total Protein 6.4 (L) 6.5 - 8.1 g/dL   Albumin 2.5 (L) 3.5 - 5.0 g/dL   AST 47 (H) 15 - 41 U/L   ALT 55 17 - 63 U/L   Alkaline Phosphatase 183 (H) 38 - 126 U/L   Total Bilirubin 1.0 0.3 - 1.2 mg/dL   GFR calc non Af Amer 13 (L) >60 mL/min   GFR calc Af Amer 15 (L) >60 mL/min  Comment: (NOTE) The eGFR has been calculated using the CKD EPI equation. This calculation has not been validated in all clinical situations. eGFR's persistently <60 mL/min signify possible Chronic Kidney Disease.    Anion gap 19 (H) 5 - 15    Comment: Performed at [facility], [address], [place], [place] [address]  CBC WITH DIFFERENTIAL     Status: Abnormal   Collection Time: 12/13/17  1:59 PM  Result Value Ref Range   WBC 26.1 (H) 4.0 - 10.5 K/uL   RBC 3.73 (L) 4.22 - 5.81 MIL/uL   Hemoglobin 11.1 (L) 13.0 - 17.0 g/dL   HCT 32.2 (L) 39.0 - 52.0 %   MCV 86.3 78.0 - 100.0 fL   MCH 29.8 26.0 - 34.0 pg   MCHC 34.5 30.0 - 36.0 g/dL   RDW 13.9 11.5 - 15.5 %   Platelets 229 150 - 400 K/uL   Neutrophils Relative % 91 %   Neutro Abs 23.8 1.7 - 7.7 K/uL   Lymphocytes Relative 4 %   Lymphs Abs 1.0 0.7 - 4.0 K/uL   Monocytes Relative 5 %   Monocytes Absolute 1.3 0.1 - 1.0 K/uL   Eosinophils Relative 0 %   Eosinophils Absolute 0.0 0.0 - 0.7 K/uL   Basophils Relative 0 %   Basophils Absolute 0.0 0.0 - 0.1 K/uL   WBC Morphology WHITE COUNT CONFIRMED ON SMEAR     Comment: MILD LEFT SHIFT (1-5% METAS, OCC MYELO, OCC BANDS) DOHLE BODIES Performed at [facility], [address], [place], [place] [address]   Blood Culture (routine x 2)     Status: None (Preliminary result)   Collection Time: 12/13/17  1:59 PM  Result Value Ref Range   Specimen Description BLOOD RIGHT WRIST    Special Requests      BOTTLES DRAWN AEROBIC AND ANAEROBIC Blood Culture adequate volume Performed at [facility], [address] [place], [place] [address]    Culture PENDING    Report Status PENDING   Troponin I      Status: None   Collection Time: 12/13/17  1:59 PM  Result Value Ref Range   Troponin I <0.03 <0.03 ng/mL    Comment: Performed at [facility], [address] [place],  [address]  Blood Culture (routine x 2)     Status: None (Preliminary result)   Collection Time: 12/13/17  2:05 PM  Result Value Ref Range   Specimen Description BLOOD RIGHT FOREARM    Special Requests      BOTTLES DRAWN AEROBIC AND ANAEROBIC Blood Culture adequate volume Performed at [facility], [address], [place],  [address]    Culture PENDING    Report Status PENDING   I-Stat CG4 Lactic Acid, ED  (not at  [facility])     Status: Abnormal   Collection Time: 12/13/17  2:26 PM  Result Value Ref Range   Lactic Acid, Venous 5.19 (HH) 0.5 - 1.9 mmol/L   Comment NOTIFIED PHYSICIAN     Assessment & Plan:  Patrick Aguilar is a 67 y.o. male with likely Fournier's gangrene supra imposed from another infection given the boil.  The ED has given him Vanc and Zosyn.  Given all the signs and the confinement to the scrotum, and response to fluids, recommend transfer for serial debridements and critical care.    -Urology consult, isolated to the scrotum at this time  -Add Clindamycin for toxin control   All questions were answered to the satisfaction of the  patient.   Virl Cagey 12/13/2017, 3:32 PM

## 2017-12-13 NOTE — ED Notes (Signed)
CRITICAL VALUE ALERT  Critical Value:  Lactic 2.32   Date & Time Notied:  1631  Provider Notified: Dr Jacqulyn BathLong  Orders Received/Actions taken: none

## 2017-12-13 NOTE — ED Notes (Signed)
Pt transported to CT ?

## 2017-12-13 NOTE — ED Notes (Signed)
[facility] He is in no distress. We maintain the 50% F.I.O2 per ventimask on which he arrives. He is receiving a Vancomycin infusion, which we also maintain per Aleris pump.

## 2017-12-13 NOTE — Progress Notes (Signed)
Pt received from PACU CRNA being bagged with 7.5 ETT measuring 26cm at the lip placement.  Bilateral breaths sounds heard which were equal.  Pt placed on settings as charted to get SpO2 up to greater than 90%. Adjustments will be made pending ABG and CXR to confirm placement of ETT.

## 2017-12-13 NOTE — Anesthesia Procedure Notes (Signed)
Procedure Name: Intubation Date/Time: 12/13/2017 8:21 PM Performed by: Lissa Morales, CRNA Pre-anesthesia Checklist: Patient identified, Emergency Drugs available, Suction available and Patient being monitored Patient Re-evaluated:Patient Re-evaluated prior to induction Oxygen Delivery Method: Circle system utilized Preoxygenation: Pre-oxygenation with 100% oxygen Induction Type: IV induction Ventilation: Mask ventilation without difficulty Laryngoscope Size: Mac, 4 and Glidescope Grade View: Grade III Tube type: Subglottic suction tube Tube size: 7.5 (taper ETT) mm Number of attempts: 1 Airway Equipment and Method: Stylet,  Oral airway and Video-laryngoscopy Placement Confirmation: ETT inserted through vocal cords under direct vision,  positive ETCO2 and breath sounds checked- equal and bilateral Secured at: 23 cm Tube secured with: Tape Dental Injury: Teeth and Oropharynx as per pre-operative assessment  Difficulty Due To: Difficulty was anticipated, Difficult Airway- due to limited oral opening, Difficult Airway- due to dentition and Difficult Airway- due to reduced neck mobility Comments: Elected glidescope  By Dr. Kalman Shan 2ndary to difficult airway previously

## 2017-12-13 NOTE — ED Notes (Signed)
CRITICAL VALUE ALERT  Critical Value:  Lactic 5.19  Date & Time Notied: 1425  Provider Notified: Dr Jacqulyn BathLong  Orders Received/Actions taken: none

## 2017-12-13 NOTE — Progress Notes (Signed)
Pharmacy Antibiotic Note  Patrick Aguilar is a 67 y.o. male admitted on 12/13/2017 with Fournier's gangrene.  Pharmacy has been consulted for zosyn and vancomycin dosing.  Plan: Zosyn 3.375 Gm IV Q8h EI Vancomycin 1500 mg x1 then 1250 mg IV q48h for est AUC =504 Goal AUC = 400-500 Daily Scr F/u cultures and levels  Height: 5\' 8"  (172.7 cm) Weight: 207 lb 7.3 oz (94.1 kg) IBW/kg (Calculated) : 68.4  Temp (24hrs), Avg:97.7 F (36.5 C), Min:97.5 F (36.4 C), Max:98 F (36.7 C)  Recent Labs  Lab 12/13/17 1359 12/13/17 1426 12/13/17 1626  WBC 26.1*  --   --   CREATININE 4.24*  --   --   LATICACIDVEN  --  5.19* 2.32*    Estimated Creatinine Clearance: 19.1 mL/min (A) (by C-G formula based on SCr of 4.24 mg/dL (H)).    Allergies  Allergen Reactions  . Morphine And Related Itching    Antimicrobials this admission: 4/3 zosyn >>  4/3 vancomycin >>  4/3 clindamycin >>  Dose adjustments this admission:   Microbiology results:  BCx:   UCx:    Sputum:    MRSA PCR:   Thank you for allowing pharmacy to be a part of this patient's care.  Lorenza EvangelistGreen, Sem Mccaughey R 12/13/2017 11:17 PM

## 2017-12-13 NOTE — Transfer of Care (Signed)
Immediate Anesthesia Transfer of Care Note  Patient: Patrick FredericksonCharles W Aguilar  Procedure(s) Performed: EXCISION OF SCROTUM AND DEBRIDEMENT OF PENIS (N/A Scrotum)  Patient Location: PACU and SICU  Anesthesia Type:General  Level of Consciousness: Patient remains intubated per anesthesia plan  Airway & Oxygen Therapy: Patient remains intubated per anesthesia plan  Post-op Assessment: Report given to RN  Post vital signs: stable  Last Vitals:  Vitals Value Taken Time  BP 113/62 12/13/2017 10:07 PM  Temp    Pulse 73 12/13/2017 10:14 PM  Resp 16 12/13/2017 10:14 PM  SpO2 92 % 12/13/2017 10:14 PM  Vitals shown include unvalidated device data.  Last Pain:  Vitals:   12/13/17 1629  TempSrc: Oral  PainSc:          Complications: No apparent anesthesia complications

## 2017-12-13 NOTE — ED Notes (Signed)
Hospitalist at bedside 

## 2017-12-13 NOTE — ED Provider Notes (Addendum)
Emergency Department Provider Note   I have reviewed the triage vital signs and the nursing notes.   HISTORY  Chief Complaint Shortness of Breath   HPI Patrick Aguilar is a 67 y.o. male with PMH of COPD, GERD, and arthritis presents to the emergency department for evaluation of worsening shortness of breath over the past 2 weeks and complaint of a "boil" in the groin.  The patient has COPD but does not use home oxygen.  He has been using albuterol 4 times per day with no relief in symptoms.  Patient states he saw his PCP, Dr. Clelia Croft, earlier in the week and then returned today for further evaluation of worsening shortness of breath symptoms and scrotal pain.  He was referred to the emergency department for urgent evaluation.  Patient denies any fevers but states he did have shaking chills 2-3 days ago.  Denies any chest or abdominal pain.  He states that the scrotum has become more red and swollen and he has severe pain in the "back part" of the scrotum.    Past Medical History:  Diagnosis Date  . Anxiety   . Arthritis   . COPD (chronic obstructive pulmonary disease) (HCC)   . GERD (gastroesophageal reflux disease)   . Shortness of breath dyspnea     Patient Active Problem List   Diagnosis Date Noted  . Fournier gangrene   . Sialolithiasis of submandibular gland 03/12/2015    Past Surgical History:  Procedure Laterality Date  . APPENDECTOMY    . BACK SURGERY     x5  . CARDIAC CATHETERIZATION  06   neg  . CERVICAL DISC SURGERY    . PLANTAR FASCIA SURGERY Bilateral   . shoulders Bilateral    rotator cuff  . SUBMANDIBULAR GLAND EXCISION Left 03/12/2015   Procedure: LEFT SUBMANDIBULAR GLAND RESECTION;  Surgeon: Christia Reading, MD;  Location: [facility] OR;  Service: ENT;  Laterality: Left;  . TONSILLECTOMY      Current Outpatient Rx  . Order #: [id number] Class: Historical Med  . Order #: [id number] Class: Historical Med  . Order #: [id number] Class: Historical Med  . Order #:  [id number] Class: Historical Med  . Order #: [id number] Class: Historical Med  . Order #: [id number] Class: Historical Med  . Order #: [id number] Class: Historical Med  . Order #: [id number] Class: Historical Med  . Order #: [id number] Class: Historical Med  . Order #: [id number] Class: Historical Med  . Order #: [id number] Class: Historical Med  . Order #: [id number] Class: Historical Med  . Order #: [id number] Class: Historical Med    Allergies Morphine and related  History reviewed. No pertinent family history.  Social History Social History   Tobacco Use  . Smoking status: Current Every Day Smoker    Packs/day: 1.00    Years: 50.00    Pack years: 50.00    Types: Cigarettes  . Smokeless tobacco: Never Used  Substance Use Topics  . Alcohol use: No    Comment: quit alcohol 80's  . Drug use: No    Review of Systems  Constitutional: No fever. Positive chills 2-3 days prior.  Eyes: No visual changes. ENT: No sore throat. Cardiovascular: Denies chest pain. Respiratory: Positive shortness of breath. Gastrointestinal: No abdominal pain.  No nausea, no vomiting.  No diarrhea.  No constipation. Genitourinary: Negative for dysuria. Positive scrotal pain and swelling.  Musculoskeletal: Negative for back pain. Skin: Scrotal redness.  Neurological: Negative for headaches, focal weakness or numbness.  10-point ROS otherwise negative.  ____________________________________________   PHYSICAL EXAM:  VITAL SIGNS: ED Triage Vitals  Enc Vitals Group     BP 12/13/17 1332 107/69     Pulse Rate 12/13/17 1332 93     Resp 12/13/17 1332 (!) 25     Temp 12/13/17 1332 98 F (36.7 C)     Temp Source 12/13/17 1332 Oral     SpO2 12/13/17 1332 (!) 88 %     Weight 12/13/17 1333 208 lb (94.3 kg)     Height 12/13/17 1333 5\' 8"  (1.727 m)     Pain Score 12/13/17 1332 10   Constitutional: Alert and oriented. Well appearing but does have some increased WOB.  Eyes: Conjunctivae are normal.  Head: Atraumatic. Nose:  No congestion/rhinnorhea. Mouth/Throat: Mucous membranes are moist.   Neck: No stridor.  Cardiovascular: Normal rate, regular rhythm. Good peripheral circulation. Grossly normal heart sounds.   Respiratory: Normal respiratory effort.  No retractions. Lungs CTAB. Gastrointestinal: Soft and nontender. No distention.  Musculoskeletal: No lower extremity tenderness nor edema. No gross deformities of extremities. Neurologic:  Normal speech and language. No gross focal neurologic deficits are appreciated.  Skin:  Skin is warm and dry. Diffuse erythema and edema of the penis and scrotum. Necrotic area overlying the posterior scrotum pictured below.      ____________________________________________   LABS (all labs ordered are listed, but only abnormal results are displayed)  Labs Reviewed  COMPREHENSIVE METABOLIC PANEL - Abnormal; Notable for the following components:      Result Value   Sodium 129 (*)    Chloride 94 (*)    CO2 16 (*)    Glucose, Bld 277 (*)    BUN 64 (*)    Creatinine, Ser 4.24 (*)    Calcium 8.5 (*)    Total Protein 6.4 (*)    Albumin 2.5 (*)    AST 47 (*)    Alkaline Phosphatase 183 (*)    GFR calc non Af Amer 13 (*)    GFR calc Af Amer 15 (*)    Anion gap 19 (*)    All other components within normal limits  CBC WITH DIFFERENTIAL/PLATELET - Abnormal; Notable for the following components:   WBC 26.1 (*)    RBC 3.73 (*)    Hemoglobin 11.1 (*)    HCT 32.2 (*)    All other components within normal limits  URINALYSIS, ROUTINE W REFLEX MICROSCOPIC - Abnormal; Notable for the following components:   Color, Urine AMBER (*)    APPearance HAZY (*)    Hgb urine dipstick MODERATE (*)    Bacteria, UA RARE (*)    Squamous Epithelial / LPF 0-5 (*)    All other components within normal limits  I-STAT CG4 LACTIC ACID, ED - Abnormal; Notable for the following components:   Lactic Acid, Venous 5.19 (*)    All other components within normal limits  I-STAT CG4 LACTIC ACID,  ED - Abnormal; Notable for the following components:   Lactic Acid, Venous 2.32 (*)    All other components within normal limits  CULTURE, BLOOD (ROUTINE X 2)  CULTURE, BLOOD (ROUTINE X 2)  TROPONIN I   ____________________________________________  EKG   EKG Interpretation  Date/Time:  Wednesday December 13 2017 13:43:24 EDT Ventricular Rate:  92 PR Interval:    QRS Duration: 120 QT Interval:  356 QTC Calculation: 441 R Axis:   81 Text Interpretation:  Sinus rhythm Nonspecific intraventricular conduction delay Probable anteroseptal infarct, old No STEMI.  Confirmed by Alona Bene [phone]) on 12/13/2017 1:52:18 PM  ____________________________________________  RADIOLOGY  Ct Abdomen Pelvis Wo Contrast  Result Date: 12/13/2017 CLINICAL DATA:  67 y/o M; possible abscess to the scrotal area with tenderness and swelling. EXAM: CT ABDOMEN AND PELVIS WITHOUT CONTRAST TECHNIQUE: Multidetector CT imaging of the abdomen and pelvis was performed following the standard protocol without IV contrast. COMPARISON:  None. FINDINGS: Lower chest: Ground-glass opacities predominantly in the left lung base with peripheral sparing. Hepatobiliary: No focal liver abnormality is seen. No gallstones, gallbladder wall thickening, or biliary dilatation. Pancreas: Unremarkable. No pancreatic ductal dilatation or surrounding inflammatory changes. Spleen: Normal in size without focal abnormality. Adrenals/Urinary Tract: Bilateral adrenal hypertrophy. No focal subcentimeter left kidney interpolar exophytic lesion measuring 42 HU, probably hemorrhagic cyst. No other focal kidney lesion identified. No hydronephrosis. Normal bladder. Stomach/Bowel: Stomach is within normal limits. Appendix not identified, no pericecal inflammation. No evidence of bowel wall thickening, distention, or inflammatory changes. Vascular/Lymphatic: Aortic atherosclerosis. No enlarged abdominal or pelvic lymph nodes. Reproductive: Prostate is  unremarkable. Severe scrotal wall thickening and edema and extensive soft tissue emphysema within the scrotal compartment extending to base of penis. Other: No abdominal wall hernia or abnormality. No abdominopelvic ascites. Musculoskeletal: Grade 1 L5-S1 anterolisthesis. L5-S1 posterior instrumented fusion, no periprosthetic lucency or fracture identified. Bilateral L5 pars defects. Severe lumbar spine spondylosis with discogenic degenerative changes greatest at the L2-L4 levels. High-grade bilateral foraminal stenosis at the L2 through S1 levels and multilevel high-grade canal stenosis. IMPRESSION: 1. Extensive scrotal edema with soft tissue emphysema extending to base of penis. Findings compatible with Fournier gangrene. 2. Ground-glass opacities greatest in left lung base partially visualized with peripheral sparing may represent pneumonitis or alveolar pulmonary edema. 3. Severe spondylosis of lumbar spine with multilevel high-grade foraminal and canal stenosis. These results were called by telephone at the time of interpretation on 12/13/2017 at 4:20 pm to Dr. Alona Bene , who verbally acknowledged these results. Electronically Signed   By: Mitzi Hansen M.D.   On: 12/13/2017 16:21   Dg Chest Portable 1 View  Result Date: 12/13/2017 CLINICAL DATA:  Short of breath EXAM: PORTABLE CHEST 1 VIEW COMPARISON:  None. FINDINGS: Severe diffuse bilateral airspace disease. Cardiac enlargement. No pleural effusion. Cervical fusion hardware IMPRESSION: Extensive diffuse bilateral airspace disease most likely pulmonary edema. Electronically Signed   By: Marlan Palau M.D.   On: 12/13/2017 15:35    ____________________________________________   PROCEDURES  Procedure(s) performed:   .Critical Care Performed by: Maia Plan, MD Authorized by: Maia Plan, MD   Critical care provider statement:    Critical care time (minutes):  75   Critical care time was exclusive of:  Separately billable  procedures and treating other patients and teaching time   Critical care was necessary to treat or prevent imminent or life-threatening deterioration of the following conditions:  Sepsis, shock, renal failure, respiratory failure and circulatory failure   Critical care was time spent personally by me on the following activities:  Blood draw for specimens, discussions with consultants, evaluation of patient's response to treatment, examination of patient, obtaining history from patient or surrogate, ordering and performing treatments and interventions, ordering and review of laboratory studies, ordering and review of radiographic studies, pulse oximetry, re-evaluation of patient's condition and review of old charts   I assumed direction of critical care for this patient from another provider in my specialty: no      ____________________________________________   INITIAL IMPRESSION / ASSESSMENT AND PLAN / ED COURSE  Pertinent labs & imaging results that were available  during my care of the patient were reviewed by me and considered in my medical decision making (see chart for details).  Presents to the emergency department with 2 weeks of worsening shortness of breath but has associated scrotal pain and swelling.  He has diffuse erythema over the scrotum and edema into the penis and extending posteriorly to the perineum where there is an area of necrotic tissue pictured above.  He has a new oxygen requirement which is likely multifactorial and includes his underlying COPD but I suspect that he may be septic.  I have activated a code sepsis, start antibiotics along with fluids and ordered a chest x-ray along with CT imaging.  02:30 PM Spoke with Dr. Henreitta LeberBridges regarding the case. She will be down to evaluate the patient in the ED. Continue IVF and abx. Lactate > 5 and WBC count of 26 noted. Patient remains awake and alert with SBP now in the 80s.   Dr. Henreitta LeberBridges evaluated the patient. Will start  Clindamycin and place foley. Advises urgent Urology consultation regarding transfer with infection appearing to be localized to the scrotum at this time.   03:00 PM Spoke with Dr. Retta Dionesahlstedt who [facility]. Discussed with Dr. Juleen ChinaKohut who accepts the patient in transfer to the ED to expedite going to the OR. Spoke with [facility] who will send a transport emergency traffic.   04:12 PM Patient with persistent sats in the high 80s/low 90s. CXR with pulmonary edema. Patient sitting back and conversational. Frequently on the phone. Will give additional duoneb but no significant change in respiratory status. Continue to monitor closely. CT reviewed with radiology by phone confirming our clinical suspicion.   Patient in no respiratory distress upon [facility] arrival. Speaking in clear sentences and BP normalizing to the point of being able to give Fentanyl.  ____________________________________________  FINAL CLINICAL IMPRESSION(S) / ED DIAGNOSES  Final diagnoses:  Fournier's gangrene of scrotum  Septic shock (HCC)  Acute renal failure, unspecified acute renal failure type (HCC)     MEDICATIONS GIVEN DURING THIS VISIT:  Medications  fentaNYL (SUBLIMAZE) injection 50 mcg (50 mcg Intravenous Given 12/13/17 1646)  ipratropium-albuterol (DUONEB) 0.5-2.5 (3) MG/3ML nebulizer solution 3 mL (3 mLs Nebulization Given 12/13/17 1409)  piperacillin-tazobactam (ZOSYN) IVPB 3.375 g (0 g Intravenous Stopped 12/13/17 1400)  sodium chloride 0.9 % bolus 1,000 mL (0 mLs Intravenous Stopped 12/13/17 1534)  vancomycin (VANCOCIN) 1,500 mg in sodium chloride 0.9 % 500 mL IVPB (1,500 mg Intravenous New Bag/Given 12/13/17 1513)  sodium chloride 0.9 % bolus 1,000 mL (0 mLs Intravenous Stopped 12/13/17 1614)    And  sodium chloride 0.9 % bolus 1,000 mL (0 mLs Intravenous Stopped 12/13/17 1623)  clindamycin (CLEOCIN) IVPB 600 mg (0 mg Intravenous Stopped 12/13/17 1627)  ipratropium-albuterol (DUONEB)  0.5-2.5 (3) MG/3ML nebulizer solution 3 mL (3 mLs Nebulization Given 12/13/17 1615)   Note:  This document was prepared using Dragon voice recognition software and may include unintentional dictation errors.  Alona BeneJoshua Taelyr Jantz, MD Emergency Medicine    Aasim Restivo, Arlyss RepressJoshua G, MD 12/13/17 1706    Maia PlanLong, Kelechi Astarita G, MD 12/13/17 [phone]

## 2017-12-13 NOTE — ED Triage Notes (Signed)
Pt states being sob x 2 weeks. No fever.

## 2017-12-13 NOTE — Progress Notes (Signed)
Pharmacy Note:  Initial antibiotic(s) regimen of Vancomycin and Zosyn ordered by EDP to treat cellulitis / sepsis.  CrCl cannot be calculated (Patient's most recent lab result is older than the maximum 21 days allowed.).   Allergies  Allergen Reactions  . Morphine And Related Itching    Vitals:   12/13/17 1332  BP: 107/69  Pulse: 93  Resp: (!) 25  Temp: 98 F (36.7 C)  SpO2: (!) 88%    Anti-infectives (From admission, onward)   Start     Dose/Rate Route Frequency Ordered Stop   12/13/17 1430  vancomycin (VANCOCIN) 1,500 mg in sodium chloride 0.9 % 500 mL IVPB     1,500 mg 250 mL/hr over 120 Minutes Intravenous  Once 12/13/17 1346     12/13/17 1345  piperacillin-tazobactam (ZOSYN) IVPB 3.375 g     3.375 g 100 mL/hr over 30 Minutes Intravenous  Once 12/13/17 1342     12/13/17 1345  vancomycin (VANCOCIN) IVPB 1000 mg/200 mL premix  Status:  Discontinued     1,000 mg 200 mL/hr over 60 Minutes Intravenous  Once 12/13/17 1342 12/13/17 1345      Plan: Initial dose(s) of Vancomycin 1500mg  and Zosyn 3.375gm X 1 ordered. F/U admission orders for further dosing if therapy continued.  Wayland DenisHall, Zoria Rawlinson A, [facility] LPRPH 12/13/2017 1:47 PM

## 2017-12-13 NOTE — Progress Notes (Signed)
Pt gave permission to ask questions on the nursing admission history in front of his family members. Patrick Aguilar. [name] BSN, RN-BC Admissions RN 12/13/2017 6:33 PM

## 2017-12-13 NOTE — Anesthesia Preprocedure Evaluation (Addendum)
Anesthesia Evaluation  Patient identified by MRN, date of birth, ID band Patient awake    Reviewed: Allergy & Precautions, NPO status , Patient's Chart, lab work & pertinent test results  Airway Mallampati: III  TM Distance: <3 FB Neck ROM: Limited    Dental no notable dental hx.    Pulmonary shortness of breath, at rest and lying, COPD,  COPD inhaler, Current Smoker,  Patient in acute hypoxic respiratory failure  O2 saturation 79% on 6L    + decreased breath sounds+ wheezing unstable     Cardiovascular negative cardio ROS Normal cardiovascular exam Rhythm:Regular Rate:Normal     Neuro/Psych Anxiety negative neurological ROS     GI/Hepatic Neg liver ROS, GERD  Medicated,  Endo/Other  diabetesuntreated  Renal/GU Renal InsufficiencyRenal disease  negative genitourinary   Musculoskeletal negative musculoskeletal ROS (+)   Abdominal   Peds negative pediatric ROS (+)  Hematology negative hematology ROS (+)   Anesthesia Other Findings   Reproductive/Obstetrics negative OB ROS                           Anesthesia Physical Anesthesia Plan  ASA: IV and emergent  Anesthesia Plan: General   Post-op Pain Management:    Induction: Intravenous  PONV Risk Score and Plan: 1 and Ondansetron  Airway Management Planned: Oral ETT and Video Laryngoscope Planned  Additional Equipment:   Intra-op Plan:   Post-operative Plan: Post-operative intubation/ventilation  Informed Consent: I have reviewed the patients History and Physical, chart, labs and discussed the procedure including the risks, benefits and alternatives for the proposed anesthesia with the patient or authorized representative who has indicated his/her understanding and acceptance.   Dental advisory given  Plan Discussed with: CRNA and Surgeon  Anesthesia Plan Comments:       Anesthesia Quick Evaluation

## 2017-12-14 ENCOUNTER — Inpatient Hospital Stay (HOSPITAL_COMMUNITY): Payer: Medicare HMO

## 2017-12-14 DIAGNOSIS — I272 Pulmonary hypertension, unspecified: Secondary | ICD-10-CM

## 2017-12-14 DIAGNOSIS — A419 Sepsis, unspecified organism: Principal | ICD-10-CM

## 2017-12-14 DIAGNOSIS — R6521 Severe sepsis with septic shock: Secondary | ICD-10-CM

## 2017-12-14 DIAGNOSIS — I5189 Other ill-defined heart diseases: Secondary | ICD-10-CM

## 2017-12-14 DIAGNOSIS — J9601 Acute respiratory failure with hypoxia: Secondary | ICD-10-CM

## 2017-12-14 DIAGNOSIS — R0603 Acute respiratory distress: Secondary | ICD-10-CM

## 2017-12-14 HISTORY — DX: Pulmonary hypertension, unspecified: I27.20

## 2017-12-14 HISTORY — DX: Other ill-defined heart diseases: I51.89

## 2017-12-14 LAB — CBC WITH DIFFERENTIAL/PLATELET
BAND NEUTROPHILS: 0 %
BLASTS: 0 %
Basophils Absolute: 0 10*3/uL (ref 0.0–0.1)
Basophils Absolute: 0 10*3/uL (ref 0.0–0.1)
Basophils Relative: 0 %
Basophils Relative: 0 %
EOS ABS: 0 10*3/uL (ref 0.0–0.7)
EOS PCT: 0 %
Eosinophils Absolute: 0 10*3/uL (ref 0.0–0.7)
Eosinophils Relative: 0 %
HCT: 28.1 % — ABNORMAL LOW (ref 39.0–52.0)
HEMATOCRIT: 26.6 % — AB (ref 39.0–52.0)
Hemoglobin: 9.7 g/dL — ABNORMAL LOW (ref 13.0–17.0)
Hemoglobin: 9.3 g/dL — ABNORMAL LOW (ref 13.0–17.0)
LYMPHS ABS: 0.4 10*3/uL — AB (ref 0.7–4.0)
LYMPHS PCT: 7 %
Lymphocytes Relative: 2 %
Lymphs Abs: 1 10*3/uL (ref 0.7–4.0)
MCH: 30.4 pg (ref 26.0–34.0)
MCH: 30.4 pg (ref 26.0–34.0)
MCHC: 34.5 g/dL (ref 30.0–36.0)
MCHC: 35 g/dL (ref 30.0–36.0)
MCV: 88.1 fL (ref 78.0–100.0)
MCV: 86.9 fL (ref 78.0–100.0)
MONO ABS: 1.4 10*3/uL — AB (ref 0.1–1.0)
MONOS PCT: 6 %
Metamyelocytes Relative: 0 %
Monocytes Absolute: 0.9 10*3/uL (ref 0.1–1.0)
Monocytes Relative: 7 %
Myelocytes: 1 %
NEUTROS ABS: 12.8 10*3/uL — AB (ref 1.7–7.7)
Neutro Abs: 18.2 10*3/uL — ABNORMAL HIGH (ref 1.7–7.7)
Neutrophils Relative %: 91 %
Neutrophils Relative %: 86 %
OTHER: 0 %
PLATELETS: 238 10*3/uL (ref 150–400)
Platelets: 248 10*3/uL (ref 150–400)
Promyelocytes Absolute: 0 %
RBC: 3.19 MIL/uL — AB (ref 4.22–5.81)
RBC: 3.06 MIL/uL — AB (ref 4.22–5.81)
RDW: 14.2 % (ref 11.5–15.5)
RDW: 14.5 % (ref 11.5–15.5)
WBC Morphology: INCREASED
WBC: 20 10*3/uL — AB (ref 4.0–10.5)
WBC: 14.7 10*3/uL — AB (ref 4.0–10.5)
nRBC: 0 /100 WBC

## 2017-12-14 LAB — GLUCOSE, CAPILLARY
GLUCOSE-CAPILLARY: 205 mg/dL — AB (ref 65–99)
GLUCOSE-CAPILLARY: 175 mg/dL — AB (ref 65–99)
GLUCOSE-CAPILLARY: 142 mg/dL — AB (ref 65–99)
Glucose-Capillary: 208 mg/dL — ABNORMAL HIGH (ref 65–99)
Glucose-Capillary: 217 mg/dL — ABNORMAL HIGH (ref 65–99)
Glucose-Capillary: 132 mg/dL — ABNORMAL HIGH (ref 65–99)

## 2017-12-14 LAB — BLOOD GAS, ARTERIAL
ACID-BASE DEFICIT: 5.1 mmol/L — AB (ref 0.0–2.0)
Acid-base deficit: 4.7 mmol/L — ABNORMAL HIGH (ref 0.0–2.0)
Acid-base deficit: 5.4 mmol/L — ABNORMAL HIGH (ref 0.0–2.0)
Acid-base deficit: 5.8 mmol/L — ABNORMAL HIGH (ref 0.0–2.0)
Acid-base deficit: 6 mmol/L — ABNORMAL HIGH (ref 0.0–2.0)
Acid-base deficit: 7.9 mmol/L — ABNORMAL HIGH (ref 0.0–2.0)
BICARBONATE: 18.5 mmol/L — AB (ref 20.0–28.0)
BICARBONATE: 20.4 mmol/L (ref 20.0–28.0)
Bicarbonate: 20.6 mmol/L (ref 20.0–28.0)
Bicarbonate: 20.8 mmol/L (ref 20.0–28.0)
Bicarbonate: 21.1 mmol/L (ref 20.0–28.0)
Bicarbonate: 21.5 mmol/L (ref 20.0–28.0)
DRAWN BY: 257881
DRAWN BY: 422461
Drawn by: 422461
Drawn by: 257881
Drawn by: 257881
Drawn by: 422461
FIO2: 100
FIO2: 100
FIO2: 100
FIO2: 70
FIO2: 80
FIO2: 80
LHR: 20 {breaths}/min
LHR: 22 {breaths}/min
LHR: 22 {breaths}/min
LHR: 28 {breaths}/min
MECHVT: 550 mL
MECHVT: 550 mL
MECHVT: 410 mL
MECHVT: 410 mL
MECHVT: 410 mL
O2 SAT: 98.4 %
O2 SAT: 93.7 %
O2 SAT: 98.6 %
O2 Saturation: 90.8 %
O2 Saturation: 95.6 %
O2 Saturation: 96.1 %
PATIENT TEMPERATURE: 97.3
PATIENT TEMPERATURE: 98.6
PATIENT TEMPERATURE: 98.6
PCO2 ART: 42.1 mmHg (ref 32.0–48.0)
PCO2 ART: 42.4 mmHg (ref 32.0–48.0)
PCO2 ART: 48.8 mmHg — AB (ref 32.0–48.0)
PEEP/CPAP: 16 cmH2O
PEEP/CPAP: 14 cmH2O
PEEP: 10 cmH2O
PEEP: 10 cmH2O
PEEP: 16 cmH2O
PH ART: 7.277 — AB (ref 7.350–7.450)
PO2 ART: 148 mmHg — AB (ref 83.0–108.0)
PO2 ART: 91.3 mmHg (ref 83.0–108.0)
Patient temperature: 98.6
Patient temperature: 98.6
Patient temperature: 98.6
RATE: 28 resp/min
RATE: 28 resp/min
VT: 550 mL
pCO2 arterial: 45.7 mmHg (ref 32.0–48.0)
pCO2 arterial: 46.8 mmHg (ref 32.0–48.0)
pCO2 arterial: 52 mmHg — ABNORMAL HIGH (ref 32.0–48.0)
pH, Arterial: 7.232 — ABNORMAL LOW (ref 7.350–7.450)
pH, Arterial: 7.252 — ABNORMAL LOW (ref 7.350–7.450)
pH, Arterial: 7.257 — ABNORMAL LOW (ref 7.350–7.450)
pH, Arterial: 7.285 — ABNORMAL LOW (ref 7.350–7.450)
pH, Arterial: 7.306 — ABNORMAL LOW (ref 7.350–7.450)
pO2, Arterial: 154 mmHg — ABNORMAL HIGH (ref 83.0–108.0)
pO2, Arterial: 69.1 mmHg — ABNORMAL LOW (ref 83.0–108.0)
pO2, Arterial: 74.4 mmHg — ABNORMAL LOW (ref 83.0–108.0)
pO2, Arterial: 94.6 mmHg (ref 83.0–108.0)

## 2017-12-14 LAB — FIBRINOGEN: Fibrinogen: 753 mg/dL — ABNORMAL HIGH (ref 210–475)

## 2017-12-14 LAB — BASIC METABOLIC PANEL
Anion gap: 9 (ref 5–15)
BUN: 52 mg/dL — ABNORMAL HIGH (ref 6–20)
CHLORIDE: 104 mmol/L (ref 101–111)
CO2: 22 mmol/L (ref 22–32)
Calcium: 7.4 mg/dL — ABNORMAL LOW (ref 8.9–10.3)
Creatinine, Ser: 2.64 mg/dL — ABNORMAL HIGH (ref 0.61–1.24)
GFR calc non Af Amer: 24 mL/min — ABNORMAL LOW (ref >60)
GFR, EST AFRICAN AMERICAN: 27 mL/min — AB (ref >60)
Glucose, Bld: 207 mg/dL — ABNORMAL HIGH (ref 65–99)
POTASSIUM: 3.8 mmol/L (ref 3.5–5.1)
Sodium: 135 mmol/L (ref 135–145)

## 2017-12-14 LAB — CORTISOL: Cortisol, Plasma: 36.3 ug/dL

## 2017-12-14 LAB — LACTIC ACID, PLASMA
LACTIC ACID, VENOUS: 0.9 mmol/L (ref 0.5–1.9)
LACTIC ACID, VENOUS: 0.8 mmol/L (ref 0.5–1.9)
LACTIC ACID, VENOUS: 1 mmol/L (ref 0.5–1.9)

## 2017-12-14 LAB — CBC
HCT: 27.6 % — ABNORMAL LOW (ref 39.0–52.0)
HEMOGLOBIN: 9.5 g/dL — AB (ref 13.0–17.0)
MCH: 30.1 pg (ref 26.0–34.0)
MCHC: 34.4 g/dL (ref 30.0–36.0)
MCV: 87.3 fL (ref 78.0–100.0)
Platelets: 255 10*3/uL (ref 150–400)
RBC: 3.16 MIL/uL — AB (ref 4.22–5.81)
RDW: 14.5 % (ref 11.5–15.5)
WBC: 17 10*3/uL — ABNORMAL HIGH (ref 4.0–10.5)

## 2017-12-14 LAB — PROCALCITONIN
Procalcitonin: 12.99 ng/mL
Procalcitonin: 12.03 ng/mL

## 2017-12-14 LAB — PHOSPHORUS: Phosphorus: 5 mg/dL — ABNORMAL HIGH (ref 2.5–4.6)

## 2017-12-14 LAB — MRSA PCR SCREENING: MRSA by PCR: NEGATIVE

## 2017-12-14 LAB — HEMOGLOBIN A1C
Hgb A1c MFr Bld: 7.4 % — ABNORMAL HIGH (ref 4.8–5.6)
Mean Plasma Glucose: 165.68 mg/dL

## 2017-12-14 LAB — SODIUM, URINE, RANDOM: Sodium, Ur: 15 mmol/L

## 2017-12-14 LAB — ECHOCARDIOGRAM COMPLETE
Height: 68 in
Weight: 3319.25 oz

## 2017-12-14 LAB — PROTIME-INR
INR: 1.39
Prothrombin Time: 17 seconds — ABNORMAL HIGH (ref 11.4–15.2)

## 2017-12-14 LAB — MAGNESIUM: Magnesium: 2.1 mg/dL (ref 1.7–2.4)

## 2017-12-14 LAB — CREATININE, URINE, RANDOM: Creatinine, Urine: 100.87 mg/dL

## 2017-12-14 MED ORDER — SODIUM CHLORIDE 0.9 % IV SOLN
1.2500 ng/kg/min | INTRAVENOUS | Status: DC
  Administered 2017-12-14: 5 ng/kg/min via INTRAVENOUS
  Filled 2017-12-14: qty 1

## 2017-12-14 MED ORDER — IPRATROPIUM-ALBUTEROL 0.5-2.5 (3) MG/3ML IN SOLN: 3.0000 mL | RESPIRATORY_TRACT | Status: DC | PRN

## 2017-12-14 MED ORDER — ACETAMINOPHEN 650 MG RE SUPP: 650.0000 mg | Freq: Four times a day (QID) | RECTAL | Status: DC | PRN

## 2017-12-14 MED ORDER — NOREPINEPHRINE BITARTRATE 1 MG/ML IV SOLN
0.0000 ug/min | INTRAVENOUS | Status: DC
  Administered 2017-12-14: 22 ug/min via INTRAVENOUS
  Administered 2017-12-15: 4 ug/min via INTRAVENOUS
  Filled 2017-12-14 (×2): qty 16

## 2017-12-14 MED ORDER — "THROMBI-PAD 3""X3"" EX PADS"
1.0000 | MEDICATED_PAD | Freq: Once | CUTANEOUS | Status: AC
  Administered 2017-12-14: 1 via TOPICAL
  Filled 2017-12-14: qty 1

## 2017-12-14 MED ORDER — SODIUM CHLORIDE 0.9% FLUSH
10.0000 mL | Freq: Two times a day (BID) | INTRAVENOUS | Status: DC
  Administered 2017-12-14 – 2017-12-24 (×12): 10 mL

## 2017-12-14 MED ORDER — DEXMEDETOMIDINE HCL IN NACL 200 MCG/50ML IV SOLN
0.4000 ug/kg/h | INTRAVENOUS | Status: DC
  Administered 2017-12-16: 0.502 ug/kg/h via INTRAVENOUS
  Administered 2017-12-16: 0.4 ug/kg/h via INTRAVENOUS
  Administered 2017-12-16 (×2): 0.5 ug/kg/h via INTRAVENOUS
  Administered 2017-12-17 (×3): 0.8 ug/kg/h via INTRAVENOUS
  Filled 2017-12-14 (×8): qty 50

## 2017-12-14 MED ORDER — CLINDAMYCIN PHOSPHATE 900 MG/50ML IV SOLN
900.0000 mg | Freq: Three times a day (TID) | INTRAVENOUS | Status: DC
  Administered 2017-12-14 – 2017-12-20 (×20): 900 mg via INTRAVENOUS
  Filled 2017-12-14 (×20): qty 50

## 2017-12-14 MED ORDER — ACETAMINOPHEN 160 MG/5ML PO SOLN
650.0000 mg | Freq: Four times a day (QID) | ORAL | Status: DC | PRN
  Administered 2017-12-15 – 2017-12-19 (×4): 650 mg
  Filled 2017-12-14 (×4): qty 20.3

## 2017-12-14 MED ORDER — CHLORHEXIDINE GLUCONATE CLOTH 2 % EX PADS
6.0000 | MEDICATED_PAD | Freq: Every day | CUTANEOUS | Status: DC
  Administered 2017-12-14 – 2017-12-24 (×10): 6 via TOPICAL

## 2017-12-14 MED ORDER — SODIUM BICARBONATE 8.4 % IV SOLN
100.0000 meq | Freq: Once | INTRAVENOUS | Status: AC
  Administered 2017-12-14: 100 meq via INTRAVENOUS
  Filled 2017-12-14: qty 100

## 2017-12-14 MED ORDER — PRO-STAT SUGAR FREE PO LIQD: 30.0000 mL | Freq: Two times a day (BID) | ORAL | Status: DC

## 2017-12-14 MED ORDER — SODIUM CHLORIDE 0.9% FLUSH: 10.0000 mL | INTRAVENOUS | Status: DC | PRN

## 2017-12-14 MED ORDER — ADULT MULTIVITAMIN LIQUID CH
15.0000 mL | Freq: Every day | ORAL | Status: DC
Start: 2017-12-14 — End: 2017-12-21
  Administered 2017-12-14 – 2017-12-21 (×7): 15 mL
  Filled 2017-12-14 (×8): qty 15

## 2017-12-14 MED ORDER — SODIUM BICARBONATE 8.4 % IV SOLN
INTRAVENOUS | Status: AC
  Filled 2017-12-14: qty 50

## 2017-12-14 MED ORDER — VANCOMYCIN HCL 10 G IV SOLR
1250.0000 mg | INTRAVENOUS | Status: DC
  Administered 2017-12-14: 1250 mg via INTRAVENOUS
  Filled 2017-12-14: qty 1250

## 2017-12-14 MED ORDER — INSULIN ASPART 100 UNIT/ML ~~LOC~~ SOLN
0.0000 [IU] | SUBCUTANEOUS | Status: DC
  Administered 2017-12-14: 7 [IU] via SUBCUTANEOUS
  Administered 2017-12-14: 4 [IU] via SUBCUTANEOUS
  Administered 2017-12-14 – 2017-12-15 (×3): 3 [IU] via SUBCUTANEOUS
  Administered 2017-12-15 (×2): 4 [IU] via SUBCUTANEOUS
  Administered 2017-12-15: 3 [IU] via SUBCUTANEOUS
  Administered 2017-12-16 (×2): 4 [IU] via SUBCUTANEOUS
  Administered 2017-12-16 (×2): 3 [IU] via SUBCUTANEOUS
  Administered 2017-12-17 (×2): 4 [IU] via SUBCUTANEOUS
  Administered 2017-12-17: 3 [IU] via SUBCUTANEOUS
  Administered 2017-12-18 (×2): 4 [IU] via SUBCUTANEOUS
  Administered 2017-12-18: 3 [IU] via SUBCUTANEOUS
  Administered 2017-12-18 (×3): 7 [IU] via SUBCUTANEOUS
  Administered 2017-12-19: 4 [IU] via SUBCUTANEOUS
  Administered 2017-12-19: 7 [IU] via SUBCUTANEOUS
  Administered 2017-12-19 (×2): 4 [IU] via SUBCUTANEOUS
  Administered 2017-12-19: 3 [IU] via SUBCUTANEOUS
  Administered 2017-12-20: 15 [IU] via SUBCUTANEOUS
  Administered 2017-12-20: 4 [IU] via SUBCUTANEOUS
  Administered 2017-12-20: 3 [IU] via SUBCUTANEOUS
  Administered 2017-12-20: 4 [IU] via SUBCUTANEOUS

## 2017-12-14 MED ORDER — VASOPRESSIN 20 UNIT/ML IV SOLN
0.0400 [IU]/min | INTRAVENOUS | Status: DC
  Administered 2017-12-14: 0.04 [IU]/min via INTRAVENOUS
  Filled 2017-12-14: qty 2

## 2017-12-14 MED ORDER — PRO-STAT SUGAR FREE PO LIQD
60.0000 mL | Freq: Two times a day (BID) | ORAL | Status: DC
  Administered 2017-12-14 – 2017-12-18 (×8): 60 mL
  Filled 2017-12-14 (×8): qty 60

## 2017-12-14 MED ORDER — MIDAZOLAM HCL 2 MG/2ML IJ SOLN: 2.0000 mg | Freq: Once | INTRAMUSCULAR | Status: DC

## 2017-12-14 MED ORDER — INSULIN ASPART 100 UNIT/ML ~~LOC~~ SOLN
4.0000 [IU] | SUBCUTANEOUS | Status: DC
  Administered 2017-12-14 – 2017-12-20 (×28): 4 [IU] via SUBCUTANEOUS

## 2017-12-14 MED ORDER — VITAL HIGH PROTEIN PO LIQD
1000.0000 mL | ORAL | Status: DC
  Administered 2017-12-14 – 2017-12-17 (×4): 1000 mL
  Filled 2017-12-14 (×5): qty 1000

## 2017-12-14 NOTE — Op Note (Signed)
Preoperative diagnosis: Fournier's gangrene  Postop diagnosis: Same  Principal procedure: Scrotal debridement/total scrotectomy  Surgeon: Raeleigh Guinn  Anesthesia: General endotracheal  Specimen: All devitalized tissue was disposed of.  Culture was sent for aerobic and anaerobic  Estimated blood loss: 150 cc  Complications: None  Drains: 16 French Foley catheter, to bedside bag  Indications: 67 year old male presenting initially to [place] in [place] on 4/3 with scrotal swelling and foul-smelling discharge.  He subsequently was sent to the emergency room at [facility] where he was diagnosed with Fournier's gangrene.  Lactic acid level was 5.  He was subsequently transferred to the emergency room at [facility] per my request for emergent management of probable Fournier's gangrene.  Exam revealed the patient to have necrotic posterior scrotal skin as well as significant edema and blanching of the anterior and lateral scrotal skin.  There was mild induration superior to the penis.  There was no peri-anal involvement of this process.  I have counseled the patient and his family regarding the emergent need for debridement of his devitalized scrotal tissue, which may eventually involve secondary procedures on follow-up.  The patient does have significant desaturation, and with his COPD, I have suggested the family that he will need intensive care management with possible ventilation and at least the short-term.  The patient and his family understand the dire condition he is then in the urgent need to proceed.  They agree with this procedure.  Findings: There was significant necrosis of the posterior scrotum, with significant discharge in this area.  There was no obvious abscess.  There was no involvement of the perianal skin.  The entire scrotal skin was devitalized, although there was no significant subcutaneous emphysema.  The skin at the base of the ventral  penis was also devitalized.  There was no evidence of ascending infection into the inguinal region.  Description of procedure: The patient was properly identified in the holding area.  He has been on 3 antibiotics.  He was taken to the operating room where general and anesthetic was administered with the anterior tracheal apparatus.  He was placed in the dorsolithotomy position.  Genitalia and perineum were prepped and draped following removal of his old Foley catheter.  I started my incision in the perineal area just anterior to the anus.  There was necrotic/black, weeping tissue in this area.  The incision and dissection was carried anteriorly, with basically the entire scrotal sac being involved with this process.  The scrotal tissue was dissected from the underlying testes/tunica vaginalis.  The testicle call the office ands seemed to be viable.  Dissection was carried up the cords, into the lower inguinal region.  In this area, the skin and subcutaneous tissue seem to be viable with excellent blood flow.  The proximal ventral penile skin was excised down to the corporal tissue.  Care was taken to avoid injury to the urethra.  A 16 Foley catheter was placed prior to the dissection.  The necrotic tissue was dissected off of the peroneal musculature.  The base of this tissue seemed to be viable as well.  I dissected bluntly up along the spermatic cords bilaterally.  There was no obvious infectious process up in this region.  Although there was minimal induration in the infrapubic region, I did not suspect there to be asc at this point, the ending infection in this area.  At this time, the edges of the dissected tissue were cauterized with electrocautery.  The  base of the wound was also cauterized until excellent hemostasis was achieved.  The testicles were then wrapped in saline gauze, and the same gauze was placed at the base of the patient's wound.  ABD pads were then placed, and mesh underpants were placed.   At this point, the procedure was terminated.  The patient was then taken intubated to the intensive care unit for further management.

## 2017-12-14 NOTE — Procedures (Signed)
Arterial Catheter Insertion Procedure Note Patrick FredericksonCharles W Aguilar [id number] 31-Aug-1951  Procedure: Insertion of Arterial Catheter  Indications: Blood pressure monitoring and Frequent blood sampling  Procedure Details Consent: Risks of procedure as well as the alternatives and risks of each were explained to the (patient/caregiver).  Consent for procedure obtained. Time Out: Verified patient identification, verified procedure, site/side was marked, verified correct patient position, special equipment/implants available, medications/allergies/relevent history reviewed, required imaging and test results available.  Performed  Maximum sterile technique was used including antiseptics, cap, gloves, gown, hand hygiene, mask and sheet. Skin prep: Chlorhexidine; local anesthetic administered 20 gauge catheter was inserted into left radial artery using the Seldinger technique.  Evaluation Blood flow good; BP tracing good. Complications: No apparent complications.  Prior to my attempt, RT attempted Left radial artery line x 2 and unable to thread wire. Using ultrasound guidance, I was able to place left radial Aline. Number of attempts: 1. Tolerated procedure well; no complications.    Patrick JewsKathleen H Aguilar 12/14/2017

## 2017-12-14 NOTE — Anesthesia Postprocedure Evaluation (Signed)
Anesthesia Post Note  Patient: Patrick FredericksonCharles W Aguilar  Procedure(s) Performed: EXCISION OF SCROTUM AND DEBRIDEMENT OF PENIS (N/A Scrotum)     Patient location during evaluation: SICU Anesthesia Type: General Level of consciousness: sedated Pain management: pain level controlled Vital Signs Assessment: post-procedure vital signs reviewed and stable Respiratory status: patient remains intubated per anesthesia plan Cardiovascular status: stable Postop Assessment: no apparent nausea or vomiting Anesthetic complications: no Comments: Patient difficult to oxygenate throughout case requiring PEEP > 10 during case to keep saturation at 90% CCM consulted about need for aggressive mechanical ventilation    Last Vitals:  Vitals:   12/14/17 0500 12/14/17 0600  BP: (!) 100/57 (!) 91/58  Pulse: 69 69  Resp: (!) 22 (!) 22  Temp:    SpO2: 98% 90%    Last Pain:  Vitals:   12/14/17 0357  TempSrc: Axillary  PainSc:                  Joyleen Haselton S

## 2017-12-14 NOTE — Progress Notes (Signed)
1230  Pt's  BP dropping into the 60's, increasing the quad strength Levo.   CVP around 17,  Color looks slightly dusky.  Anders SimmondsPete Babcock called and informed, vent changes made, Peep to 10,  Decreased to 70%.  Will get stat cbc, and additional BP med ordered.  Levo now at 36mcg.  Daughter at bedside and informed of change in condition.

## 2017-12-14 NOTE — Progress Notes (Signed)
Repeat ABG at 4am shows 7.25 / 42 / 154 / 18.5. Given the improved oxygenation, will decrease PEEP from 16 to 14. Keep FIO2 at 100% for now. Worsening Bicarb though, possibly due to worsening renal function. Will give another 2 amps Bicarb IV now. Repeat ABG at 8am.   Milana ObeyKathleen Hammonds, MD Pulmonary & Critical Care Medicine Pager: [phone]

## 2017-12-14 NOTE — Progress Notes (Addendum)
PULMONARY / CRITICAL CARE MEDICINE   Name: Patrick Aguilar MRN:   [id number] DOB:   19-Jan-1951           ADMISSION DATE:  12/13/2017 CONSULTATION DATE: 12/13/17  REFERRING MD: Dr Retta Dionesahlstedt (Urology)  CHIEF COMPLAINT: Fournier's gangrene, Septic shock, VDRF, ARDS  HISTORY OF PRESENT ILLNESS:   67yoM with hx COPD, HTN, GERD, OA, and Anxiety, presented to [facility] on 4/3 AM c/o SOB x 2 weeks and scrotal pain. He was found to have acute hypoxic respiratory failure and fournier's gangrene. He was transferred to [facility] where he underwent operative debridement. Postop he returned to the ICU, intubated on max vent settings with acute hypoxic respiratory failure and ARDS and in shock on vasopressors. Patient's daughter is at his bedside. Patient is sedated.    Subjective Sedated on vent Still on high dose pressors  Objective  Blood Pressure (Abnormal) 85/55   Pulse 67   Temperature 98 F (36.7 C) (Oral)   Respiration (Abnormal) 22   Height 5\' 8"  (1.727 m)   Weight 207 lb 7.3 oz (94.1 kg)   Oxygen Saturation 95%   Body Mass Index 31.54 kg/m   CVP:  [18 mmHg-27 mmHg] 18 mmHg . sodium chloride    . sodium chloride    . albumin human    . clindamycin (CLEOCIN) IV Stopped (12/14/17 0307)  . fentaNYL 100 mcg/hr (12/14/17 0700)  . norepinephrine 20 mcg/min (12/14/17 0855)  . phenylephrine (NEO-SYNEPHRINE) Adult infusion Stopped (12/13/17 2303)  . piperacillin-tazobactam (ZOSYN)  IV 3.375 g (12/14/17 0800)  . [START ON 12/15/2017] vancomycin    . vasopressin (PITRESSIN) infusion - *FOR SHOCK* Stopped (12/14/17 0225)   Filed Weights   12/13/17 1333 12/13/17 2200 12/14/17 0426  Weight: 208 lb (94.3 kg) 207 lb 7.3 oz (94.1 kg) 207 lb 7.3 oz (94.1 kg)    Intake/Output Summary (Last 24 hours) at 12/14/2017 0942 Last data filed at 12/14/2017 0900 Gross per 24 hour  Intake 6773.02 ml  Output 1805 ml  Net 4968.02 ml   Physical  General 67 year old white male. Sedated on  vent HENT: MMM, no JVD orally intubated.  Pulm: clear no wheeze decreased bases. pPlat 29  Card: RRR no MRG Ext: warm and dry. Scrotal dressing w/ bloody dc abd soft not tender + bowel sounds Neuro sedated  CBC Recent Labs    12/13/17 1359 12/13/17 2304 12/14/17 0923  WBC 26.1* 20.0* 17.0*  HGB 11.1* 9.7* 9.5*  HCT 32.2* 28.1* 27.6*  PLT 229 238 255    Coag's Recent Labs    12/13/17 2304  INR 1.39    BMET Recent Labs    12/13/17 1359 12/13/17 2304  NA 129* 136  K 4.9 4.3  CL 94* 109  CO2 16* 17*  BUN 64* 54*  CREATININE 4.24* 2.90*  GLUCOSE 277* 172*    Electrolytes Recent Labs    12/13/17 1359 12/13/17 2304  CALCIUM 8.5* 7.5*  MG  --  2.0  PHOS  --  4.7*    Sepsis Markers Recent Labs    12/13/17 2304  PROCALCITON 12.99    ABG Recent Labs    12/14/17 0043 12/14/17 0405 12/14/17 0919  PHART 7.252* 7.257* 7.306*  PCO2ART 48.8* 42.4 42.1  PO2ART 69.1* 154* 74.4*    Liver Enzymes Recent Labs    12/13/17 1359  AST 47*  ALT 55  ALKPHOS 183*  BILITOT 1.0  ALBUMIN 2.5*    Cardiac Enzymes Recent Labs  12/13/17 1359  TROPONINI <0.03    Glucose Recent Labs    12/13/17 2323 12/14/17 0313 12/14/17 0757  GLUCAP 181* 208* 205*    Imaging Ct Abdomen Pelvis Wo Contrast  Result Date: 12/13/2017 CLINICAL DATA:  67 y/o M; possible abscess to the scrotal area with tenderness and swelling. EXAM: CT ABDOMEN AND PELVIS WITHOUT CONTRAST TECHNIQUE: Multidetector CT imaging of the abdomen and pelvis was performed following the standard protocol without IV contrast. COMPARISON:  None. FINDINGS: Lower chest: Ground-glass opacities predominantly in the left lung base with peripheral sparing. Hepatobiliary: No focal liver abnormality is seen. No gallstones, gallbladder wall thickening, or biliary dilatation. Pancreas: Unremarkable. No pancreatic ductal dilatation or surrounding inflammatory changes. Spleen: Normal in size without focal abnormality.  Adrenals/Urinary Tract: Bilateral adrenal hypertrophy. No focal subcentimeter left kidney interpolar exophytic lesion measuring 42 HU, probably hemorrhagic cyst. No other focal kidney lesion identified. No hydronephrosis. Normal bladder. Stomach/Bowel: Stomach is within normal limits. Appendix not identified, no pericecal inflammation. No evidence of bowel wall thickening, distention, or inflammatory changes. Vascular/Lymphatic: Aortic atherosclerosis. No enlarged abdominal or pelvic lymph nodes. Reproductive: Prostate is unremarkable. Severe scrotal wall thickening and edema and extensive soft tissue emphysema within the scrotal compartment extending to base of penis. Other: No abdominal wall hernia or abnormality. No abdominopelvic ascites. Musculoskeletal: Grade 1 L5-S1 anterolisthesis. L5-S1 posterior instrumented fusion, no periprosthetic lucency or fracture identified. Bilateral L5 pars defects. Severe lumbar spine spondylosis with discogenic degenerative changes greatest at the L2-L4 levels. High-grade bilateral foraminal stenosis at the L2 through S1 levels and multilevel high-grade canal stenosis. IMPRESSION: 1. Extensive scrotal edema with soft tissue emphysema extending to base of penis. Findings compatible with Fournier gangrene. 2. Ground-glass opacities greatest in left lung base partially visualized with peripheral sparing may represent pneumonitis or alveolar pulmonary edema. 3. Severe spondylosis of lumbar spine with multilevel high-grade foraminal and canal stenosis. These results were called by telephone at the time of interpretation on 12/13/2017 at 4:20 pm to Dr. Alona Bene , who verbally acknowledged these results. Electronically Signed   By: Mitzi Hansen M.D.   On: 12/13/2017 16:21   Dg Chest 1 View  Result Date: 12/14/2017 CLINICAL DATA:  Central line placement EXAM: CHEST  1 VIEW COMPARISON:  12/13/2017, 03/26/2012 FINDINGS: Endotracheal tube tip is about 2.9 cm superior to  carina. Insertion of right central venous catheter with tip projecting over SVC. No pneumothorax. Esophageal tube tip is below the diaphragm but non included. Extensive left greater than right airspace disease, similar compared to prior. Stable cardiomediastinal silhouette with aortic atherosclerosis. Postsurgical changes in the cervical spine. IMPRESSION: 1. Endotracheal tube tip about 2.9 cm superior to carina 2. New right IJ central venous catheter with tip projecting over SVC. No pneumothorax. 3. No definite interval change in fairly extensive left greater than right airspace disease Electronically Signed   By: Jasmine Pang M.D.   On: 12/14/2017 01:35   Dg Chest 1 View  Result Date: 12/13/2017 CLINICAL DATA:  NG and ET tube placement. EXAM: CHEST  1 VIEW COMPARISON:  Chest x-ray from same day at 15:20. FINDINGS: Endotracheal tube in place with the tip approximately 1 cm above the level of the carina. Interval placement of an NG tube with the tip in the gastric fundus. Stable cardiomegaly with left greater than right perihilar airspace disease. No pleural effusion or pneumothorax. No acute osseous abnormality. IMPRESSION: 1. Endotracheal tube in place with the tip 1 cm above the level of the carina. Recommend retraction by  2-3 cm. 2. NG tube within the stomach. 3. Unchanged extensive left greater than right airspace disease. Electronically Signed   By: Obie Dredge M.D.   On: 12/13/2017 22:54   Dg Abd 1 View  Result Date: 12/13/2017 CLINICAL DATA:  Life support line placement. EXAM: ABDOMEN - 1 VIEW COMPARISON:  CT abdomen and pelvis December 13, 2017 FINDINGS: No is a gastric tube looped in proximal stomach with distal tip projecting in gastric cardia, side port past GE junction region. Included bowel gas pattern is nondilated and nonobstructive. No intra-abdominal mass effect or pathologic calcifications. L5-S1 PLIF. Severe degenerative change of the lumbar spine. IMPRESSION: Nasogastric tube tip projects  in proximal stomach. Electronically Signed   By: Awilda Metro M.D.   On: 12/13/2017 22:51   Dg Chest Port 1 View  Result Date: 12/14/2017 CLINICAL DATA:  Respiratory failure EXAM: PORTABLE CHEST 1 VIEW COMPARISON:  12/14/2017 FINDINGS: Endotracheal tube in good position. Right jugular catheter tip in the SVC. NG tube in the stomach. Diffuse bilateral airspace disease left greater than right is unchanged. No significant pleural effusion. IMPRESSION: Severe bilateral airspace disease left greater than right unchanged. Endotracheal tube remains in good position. Electronically Signed   By: Marlan Palau M.D.   On: 12/14/2017 07:42   Dg Chest Portable 1 View  Result Date: 12/13/2017 CLINICAL DATA:  Short of breath EXAM: PORTABLE CHEST 1 VIEW COMPARISON:  None. FINDINGS: Severe diffuse bilateral airspace disease. Cardiac enlargement. No pleural effusion. Cervical fusion hardware IMPRESSION: Extensive diffuse bilateral airspace disease most likely pulmonary edema. Electronically Signed   By: Marlan Palau M.D.   On: 12/13/2017 15:35    CULTURES: Blood cultures (4/3): pending Operative culture (4/3): pending Sputum culture: ordered UA: ordered  ANTIBIOTICS: Vancomycin 4/3 >> Zosyn 4/3 >> Clindamycin 4/3 >>  SIGNIFICANT EVENTS: 4/3: presented to ER with SOB and Scrotal edema, found to have septic shock due to fournier's gangrene, now s/p debridement; VDRF with ARDS  LINES/TUBES: PIV's ETT 4/3 >> Foley 4/3 >> OG tube 4/3 >>  Impression/Plan  Acute hypoxic respiratory failure in setting of ARDS superimposed on underlying COPD -cxr personally reviewed. Support tubes and lines in good position. Some improvement in aeration.  Plan Cont ARDS protocol w/ high peep arm PAD protocol RASS goal -2 Scheduled BDs (h/o COPD) Repeat abg and CXR in am    Septic shock w/ MODS in setting of Fournier's Gangrene -wound culture w/ abundant gpc and rare GNR Plan Day # 2 vanc, zosyn and  clinda Cont wound care Per surgical services CVP goal 8-12 MAP goal > 65 Will use pressors and at this point can back down on fluids Adding vasopressin F/u echo   AKI w/ metabolic acidosis (NAG) initially had +AG d/t lactic acidosis  -LA cleared.  -acid base improved.  Plan MAP goal >65 KVO IVFs (CVP >20) Renal dose meds  Strict I&O F/u am chemistry  At risk for fluid and electrolyte imbalance Plan Serial chemistries  Acute metabolic Encephalopathy Plan Supportive care PAD protocol   Anemia of critical illness Plan Trend cbc Wells heparin   Shock liver Plan F/u LFTs in am  H/o GERD Plan H2B  DM w/ hyperglycemia Plan Cont ssi add basal dosing    DVT prophylaxis: [place] heparin SUP: H2B Diet: start tubefeeds Activity: BR Disposition : ICU level care  Daughter updated  Simonne Martinet ACNP-BC [facility] Pulmonary/Critical Care Pager # [phone] OR # [phone] if no answer

## 2017-12-14 NOTE — Progress Notes (Signed)
Per the patient's daughter, he lives alone and handles his meds himself. [facility]. The home med list has been updated with prescription fill data from these sources.   Of note, he is a new [facility] and his initial order was filled and shipped on 11/29/17 and included 10 medications.   [facility]: Percocet 10-325 on 11/23/17 Septra DS on 12/11/17 (10 day course)   Charolotte Ekeom Lilac Hoff, PharmD. Mobile: [phone]. 12/14/2017,2:49 PM.

## 2017-12-14 NOTE — Procedures (Signed)
Central Venous Catheter Insertion Procedure Note  Procedure: Insertion of Central Venous Catheter  Indications:  vascular access, hypovolemia  Procedure Details  Informed consent was obtained for the procedure, including sedation.  Risks of lung perforation, hemorrhage, arrhythmia, and adverse drug reaction were discussed.   Maximum sterile technique was used including antiseptics, cap, gloves, gown, hand hygiene, mask and sheet.  Under sterile conditions the skin above the on the right internal jugular vein was prepped with betadine and covered with a sterile drape. Local anesthesia was applied to the skin and subcutaneous tissues. A 22-gauge needle was used to identify the vein. An 18-gauge needle was then inserted into the vein. A guide wire was then passed easily through the catheter. There were occasional PVCs. The catheter was then withdrawn. A triple lumen central line was then inserted into the vessel over the guide wire. The catheter was sutured into place.  Findings: There were no changes to vital signs. Catheter was flushed with 20 cc NS. Patient did tolerate procedure well.  Recommendations: CXR ordered to verify placement.

## 2017-12-14 NOTE — Progress Notes (Signed)
Initial Nutrition Assessment  DOCUMENTATION CODES:   Obesity unspecified  INTERVENTION:  - Will order Vital High Protein @ 40 mL/hr with 60 mL Prostat BID. This regimen will provide 1360 kcal (103% estimated kcal need), 144 grams of protein, and 802 mL free water.  - Will order liquid multivitamin given TF rate <45 mL/hr.  - Defer free water flush, if desired, to MD/NP given risk of fluid imbalance.  NUTRITION DIAGNOSIS:   Inadequate oral intake related to inability to eat as evidenced by NPO status.  GOAL:   Provide needs based on ASPEN/SCCM guidelines  MONITOR:   Vent status, TF tolerance, Weight trends, Labs, Skin  REASON FOR ASSESSMENT:   Ventilator, Low Braden, Consult Enteral/tube feeding initiation and management  ASSESSMENT:   67 yo male with hx of COPD, HTN, GERD, OA, and anxiety, He [facility] on 4[facility]3 AM c/o SOB x 2 weeks and scrotal pain. He was found to have acute hypoxic respiratory failure and fournier's gangrene. [facility] was [facility] where he underwent operative debridement. Postop he returned to the ICU, intubated on max vent settings with acute hypoxic respiratory failure and ARDS and in shock on vasopressors.   Pt intubated, sedated, with OGT in place. Per review of abdominal x-ray from yesterday, the tip of OGT is noted to be in the proximal stomach. Three male family members at bedside at the time of RD visit. They report that pt usually has a very good appetite. On Sunday (3/31) his sister took him a meal and he only consumed ~1/3 of it which is unusual for him. Since that time, appetite has been very poor. It is unknown if appetite had begun to decrease in the days leading up to 3/31. Family reports that on the date of admission pt was very confused and also drove [facility]. Per chart review, pt [facility] 201 lbs [facility] indicating a 6 lb weight gain in the past 3 months; this is insignificant/not  concerning.   Per Pete's note this AM: ARDS, septic shock in the setting of Fournier's gangrene with goal MAP >65, AKI with metabolic acidosis, risk for fluid and electrolyte imbalances, acute metabolic encephalopthy, liver shock with plan to follow LFTs.  Patient is currently intubated on ventilator support MV: 11.8 L/min Temp (24hrs), Avg:97.6 F (36.4 C), Min:97.3 F (36.3 C), Max:98 F (36.7 C) Propofol: none BP: 87/52 and MAP: 62  Medications reviewed; 25 g albumin x1 dose yesterday, 100 mg Colace per OGT BID, sliding scale Novolog, 4 units Novolog every 4 hours, 1 amp sodium bicarb x2 today. Labs reviewed; CBGs: 208 and 205 mg/dL this AM, BUN: 54 mg/dL, creatinine: 2.9 mg/dL, Ca: 7.5 mg/dL, Phos: 4.7 mg/dL, GFR: 21 mL/min.   Drips: Fentanyl @ 100 mcg/hr, Levo @ 22 mcg/min.      NUTRITION - FOCUSED PHYSICAL EXAM:  Completed/assessed with no muscle and no fat wasting noted; no edema noted at this time.   Diet Order:  Diet NPO time specified  EDUCATION NEEDS:   No education needs have been identified at this time  Skin:  Skin Assessment: Skin Integrity Issues: Skin Integrity Issues:: Incisions Incisions: perineum from debridement on 4/3  Last BM:  PTA/unknown  Height:   Ht Readings from Last 1 Encounters:  12/13/17 5\' 8"  (1.727 m)    Weight:   Wt Readings from Last 1 Encounters:  12/14/17 207 lb 7.3 oz (94.1 kg)    Ideal Body Weight:  70 kg  BMI:  Body mass index is 31.54 kg/m.  Estimated Nutritional Needs:   Kcal:  1610-9604 (11-14 kcal/kg)  Protein:  >/= 140 grams (2 grams/kg IBW)  Fluid:  >/= 1.8 L/day      Trenton Gammon, MS, RD, LDN, [facility] Inpatient Clinical Dietitian Pager # [phone] After hours/weekend pager # [phone]

## 2017-12-14 NOTE — Progress Notes (Signed)
Inpatient Diabetes Program Recommendations  AACE/ADA: New Consensus Statement on Inpatient Glycemic Control (2015)  Target Ranges:  Prepandial:   less than 140 mg/dL      Peak postprandial:   less than 180 mg/dL (1-2 hours)      Critically ill patients:  140 - 180 mg/dL   Lab Results  Component Value Date   GLUCAP 205 (H) 12/14/2017   HGBA1C 7.4 (H) 12/13/2017    Review of Glycemic Control  Diabetes history: None Outpatient Diabetes medications: N/A Current orders for Inpatient glycemic control: ICU Glycemic Control protocol. Novolog 1-3 units Q4H  HgbA1C - 7.4%. Indicates diabetes diagnosis. Will await MD to speak with pt regarding HgbA1C, then will begin education.  Inpatient Diabetes Program Recommendations:     Agree with orders.  Follow.  Thank you. Ailene Ardshonda Manreet Kiernan, RD, LDN, CDE Inpatient Diabetes Coordinator [phone]

## 2017-12-14 NOTE — Progress Notes (Signed)
eLink Physician-Brief Progress Note Patient Name: Patrick FredericksonCharles W Aguilar DOB: 06-08-1951 MRN: [id number]   Date of Service  12/14/2017  HPI/Events of Note  Persistent hypercapnia and mixed picture acidosis (metabolic + respiratory)  eICU Interventions  Increase resp rate to 30        Okoronkwo U Ogan 12/14/2017, 10:57 PM

## 2017-12-14 NOTE — Progress Notes (Signed)
Pt's BP has been decreasing with the nurse increasing his BP medicine. RT obtained a ABG and decreased his PEEP to 10 and FIO2 60%. The Pt's BP iincreased to 85/43 but his SATS dropped to 88%. RT increased the FIO2 to 70%. RT will get another ABG and will continue to monitor

## 2017-12-14 NOTE — Progress Notes (Addendum)
  Echocardiogram 2D Echocardiogram has been performed.  Technically difficult study due to patient inability to be positioned.   Angles Trevizo L Androw 12/14/2017, 11:51 AM

## 2017-12-15 ENCOUNTER — Inpatient Hospital Stay (HOSPITAL_COMMUNITY): Payer: Medicare HMO

## 2017-12-15 LAB — BLOOD GAS, ARTERIAL
ACID-BASE DEFICIT: 4.1 mmol/L — AB (ref 0.0–2.0)
Acid-base deficit: 6.2 mmol/L — ABNORMAL HIGH (ref 0.0–2.0)
Acid-base deficit: 5.1 mmol/L — ABNORMAL HIGH (ref 0.0–2.0)
BICARBONATE: 21.3 mmol/L (ref 20.0–28.0)
Bicarbonate: 21.8 mmol/L (ref 20.0–28.0)
Bicarbonate: 21.6 mmol/L (ref 20.0–28.0)
DRAWN BY: 11249
DRAWN BY: 422461
Drawn by: 257881
FIO2: 100
FIO2: 50
FIO2: 100
LHR: 30 {breaths}/min
LHR: 30 {breaths}/min
MECHVT: 410 mL
O2 SAT: 99.1 %
O2 Saturation: 90.2 %
O2 Saturation: 97.3 %
PATIENT TEMPERATURE: 98.6
PATIENT TEMPERATURE: 101.3
PATIENT TEMPERATURE: 98.1
PCO2 ART: 55.1 mmHg — AB (ref 32.0–48.0)
PCO2 ART: 42.9 mmHg (ref 32.0–48.0)
PEEP/CPAP: 14 cmH2O
PEEP: 10 cmH2O
PEEP: 14 cmH2O
PO2 ART: 67.2 mmHg — AB (ref 83.0–108.0)
RATE: 30 resp/min
VT: 480 mL
VT: 480 mL
pCO2 arterial: 57.8 mmHg — ABNORMAL HIGH (ref 32.0–48.0)
pH, Arterial: 7.198 — CL (ref 7.350–7.450)
pH, Arterial: 7.228 — ABNORMAL LOW (ref 7.350–7.450)
pH, Arterial: 7.316 — ABNORMAL LOW (ref 7.350–7.450)
pO2, Arterial: 226 mmHg — ABNORMAL HIGH (ref 83.0–108.0)
pO2, Arterial: 101 mmHg (ref 83.0–108.0)

## 2017-12-15 LAB — COMPREHENSIVE METABOLIC PANEL
ALT: 38 U/L (ref 17–63)
ANION GAP: 11 (ref 5–15)
AST: 27 U/L (ref 15–41)
Albumin: 2 g/dL — ABNORMAL LOW (ref 3.5–5.0)
Alkaline Phosphatase: 170 U/L — ABNORMAL HIGH (ref 38–126)
BUN: 57 mg/dL — ABNORMAL HIGH (ref 6–20)
CHLORIDE: 105 mmol/L (ref 101–111)
CO2: 22 mmol/L (ref 22–32)
Calcium: 7.8 mg/dL — ABNORMAL LOW (ref 8.9–10.3)
Creatinine, Ser: 2.51 mg/dL — ABNORMAL HIGH (ref 0.61–1.24)
GFR, EST AFRICAN AMERICAN: 29 mL/min — AB (ref >60)
GFR, EST NON AFRICAN AMERICAN: 25 mL/min — AB (ref >60)
Glucose, Bld: 168 mg/dL — ABNORMAL HIGH (ref 65–99)
POTASSIUM: 4.2 mmol/L (ref 3.5–5.1)
SODIUM: 138 mmol/L (ref 135–145)
Total Bilirubin: 0.6 mg/dL (ref 0.3–1.2)
Total Protein: 5.3 g/dL — ABNORMAL LOW (ref 6.5–8.1)

## 2017-12-15 LAB — GLUCOSE, CAPILLARY
GLUCOSE-CAPILLARY: 146 mg/dL — AB (ref 65–99)
GLUCOSE-CAPILLARY: 99 mg/dL (ref 65–99)
Glucose-Capillary: 133 mg/dL — ABNORMAL HIGH (ref 65–99)
Glucose-Capillary: 168 mg/dL — ABNORMAL HIGH (ref 65–99)
Glucose-Capillary: 174 mg/dL — ABNORMAL HIGH (ref 65–99)

## 2017-12-15 LAB — CBC
HEMATOCRIT: 26.7 % — AB (ref 39.0–52.0)
HEMOGLOBIN: 9.3 g/dL — AB (ref 13.0–17.0)
MCH: 30.6 pg (ref 26.0–34.0)
MCHC: 34.8 g/dL (ref 30.0–36.0)
MCV: 87.8 fL (ref 78.0–100.0)
PLATELETS: 224 10*3/uL (ref 150–400)
RBC: 3.04 MIL/uL — AB (ref 4.22–5.81)
RDW: 14.9 % (ref 11.5–15.5)
WBC: 14.3 10*3/uL — ABNORMAL HIGH (ref 4.0–10.5)

## 2017-12-15 LAB — MAGNESIUM
MAGNESIUM: 2.1 mg/dL (ref 1.7–2.4)
MAGNESIUM: 2.2 mg/dL (ref 1.7–2.4)

## 2017-12-15 LAB — PROCALCITONIN: PROCALCITONIN: 9.02 ng/mL

## 2017-12-15 LAB — PHOSPHORUS
PHOSPHORUS: 6 mg/dL — AB (ref 2.5–4.6)
PHOSPHORUS: 5.2 mg/dL — AB (ref 2.5–4.6)

## 2017-12-15 MED ORDER — VANCOMYCIN HCL IN DEXTROSE 1-5 GM/200ML-% IV SOLN
1000.0000 mg | INTRAVENOUS | Status: DC
  Filled 2017-12-15 (×2): qty 200

## 2017-12-15 NOTE — Progress Notes (Signed)
Pharmacy Antibiotic Note  Patrick Aguilar is a 67 y.o. male admitted on 12/13/2017 with Fournier's gangrene.  Pharmacy has been consulted for zosyn and vancomycin dosing.  Also on Clindamycin IV for toxin production.  Today, 12/15/2017 Day #2   Renal: Slow improvement in SCr. UOP adequate  WBC trending down   Septic shock on pressor support  Tm 101.3  Await final cx results (GPR, GPC)  PCT 12.99 to 9.02  Plan:  Zosyn 3.375 Gm IV Q8h EI  Adjust vanco to 1gm IV q36h   Daily Scr  F/u cultures   Check levels as indicated  Height: 5\' 8"  (172.7 cm) Weight: 213 lb 13.5 oz (97 kg) IBW/kg (Calculated) : 68.4  Temp (24hrs), Avg:99.1 F (37.3 C), Min:98 F (36.7 C), Max:101.3 F (38.5 C)  Recent Labs  Lab 12/13/17 1359 12/13/17 1426 12/13/17 1626 12/13/17 2304 12/14/17 0205 12/14/17 0815 12/14/17 0923 12/14/17 1259 12/15/17 0431  WBC 26.1*  --   --  20.0*  --   --  17.0* 14.7* 14.3*  CREATININE 4.24*  --   --  2.90*  --   --  2.64*  --  2.51*  LATICACIDVEN  --  5.19* 2.32* 0.9 0.8 1.0  --   --   --     Estimated Creatinine Clearance: 32.7 mL/min (A) (by C-G formula based on SCr of 2.51 mg/dL (H)).    Allergies  Allergen Reactions  . Morphine And Related Itching    Antimicrobials this admission: 4/3 zosyn >>  4/3 vancomycin >>  4/3 clindamycin >>  Dose adjustments this admission: 4/5 change vanco to 1gm IV q36h for improved renal fx  Microbiology results: 4/3 BCx at 1400: NGTD 4/3 BCx at 2300:  4/3 wound cx: abundant GPC, rare GPR 4/4 MRSA PCR: neg 4/5 trach asp: Thank you for allowing pharmacy to be a part of this patient's care.  Juliette Alcideustin Zeigler, PharmD, BCPS.   Pager: [phone] 12/15/2017 7:27 AM

## 2017-12-15 NOTE — Progress Notes (Signed)
2 Days Post-Op Subjective: Patient  Stable, on ventilator/pressor support still.  More alert this morning.  Objective: Vital signs in last 24 hours: Temp:  [98.1 F (36.7 C)-101.3 F (38.5 C)] 101.3 F (38.5 C) (04/05 0400) Pulse Rate:  [67-104] 81 (04/05 0755) Resp:  [19-30] 30 (04/05 0755) BP: (70-147)/(45-83) 106/54 (04/05 0755) SpO2:  [88 %-99 %] 97 % (04/05 0600) FiO2 (%):  [60 %-100 %] 80 % (04/05 0755) Weight:  [97 kg (213 lb 13.5 oz)] 97 kg (213 lb 13.5 oz) (04/05 0419)  Intake/Output from previous day: 04/04 0701 - 04/05 0700 In: 4329.6 [I.V.:3879.6; IV Piggyback:450] Out: 1500 [Urine:1500] Intake/Output this shift: Total I/O In: -  Out: 300 [Urine:300]  Physical Exam:   Patient still on ventilator, arousable. Perineal/genital dressing removed.  Careful exam performed.  There is still mild erythema/edema in the infrapubic area.  There is no subcutaneous emphysema or dusky skin.  Edges around resected area are viable, not necrotic.  Base of wound is clean.  Left greater than right fibrinous exudate on clinical vaginalis.  Both cords and testicles appear viable.  There is no evidence of progressive necrotic process.  Lab Results: Recent Labs    12/14/17 0923 12/14/17 1259 12/15/17 0431  HGB 9.5* 9.3* 9.3*  HCT 27.6* 26.6* 26.7*   BMET Recent Labs    12/14/17 0923 12/15/17 0431  NA 135 138  K 3.8 4.2  CL 104 105  CO2 22 22  GLUCOSE 207* 168*  BUN 52* 57*  CREATININE 2.64* 2.51*  CALCIUM 7.4* 7.8*   Recent Labs    12/13/17 2304  INR 1.39   No results for input(s): LABURIN in the last 72 hours. Results for orders placed or performed during the hospital encounter of 12/13/17  Blood Culture (routine x 2)     Status: None (Preliminary result)   Collection Time: 12/13/17  1:59 PM  Result Value Ref Range Status   Specimen Description BLOOD RIGHT WRIST  Final   Special Requests   Final    BOTTLES DRAWN AEROBIC AND ANAEROBIC Blood Culture adequate volume    Culture   Final    NO GROWTH 2 DAYS Performed at [facility], [address] [place], [place] [address]    Report Status PENDING  Incomplete  Blood Culture (routine x 2)     Status: None (Preliminary result)   Collection Time: 12/13/17  2:05 PM  Result Value Ref Range Status   Specimen Description BLOOD RIGHT FOREARM  Final   Special Requests   Final    BOTTLES DRAWN AEROBIC AND ANAEROBIC Blood Culture adequate volume   Culture   Final    NO GROWTH 2 DAYS Performed at [facility], [address], [place], [place] [address]    Report Status PENDING  Incomplete  Aerobic/Anaerobic Culture (surgical/deep wound)     Status: None (Preliminary result)   Collection Time: 12/13/17  9:01 PM  Result Value Ref Range Status   Specimen Description   Final    WOUND Performed at [facility], [address], [place], [place] [address]    Special Requests   Final    NONE Performed at [facility], [address] [place], [place] [address]    Gram Stain   Final    NO WBC SEEN ABUNDANT GRAM POSITIVE COCCI RARE GRAM POSITIVE RODS Performed at [facility], [address], [place], [place] [address]    Culture PENDING  Incomplete   Report  Status PENDING  Incomplete  MRSA PCR Screening     Status: None   Collection Time: 12/14/17  1:53 AM  Result Value Ref Range Status   MRSA by PCR NEGATIVE NEGATIVE Final    Comment:        The GeneXpert MRSA Assay (FDA approved for NASAL specimens only), is one component of a comprehensive MRSA colonization surveillance program. It is not intended to diagnose MRSA infection nor to guide or monitor treatment for MRSA infections. Performed at [facility], [address] [place], [place] [address]   Culture, respiratory (NON-Expectorated)     Status: None (Preliminary result)   Collection Time: 12/15/17  3:03 AM  Result Value Ref Range Status   Specimen Description   Final     TRACHEAL ASPIRATE Performed at [facility], [address] [place], [place] [address]    Special Requests   Final    NONE Performed at [facility], [address] [place], [place] [address]    Gram Stain   Final    ABUNDANT WBC PRESENT, PREDOMINANTLY PMN ABUNDANT YEAST Performed at [facility], [address] [place], [place] [address]    Culture PENDING  Incomplete   Report Status PENDING  Incomplete    Studies/Results: Ct Abdomen Pelvis Wo Contrast  Result Date: 12/13/2017 CLINICAL DATA:  67 y/o M; possible abscess to the scrotal area with tenderness and swelling. EXAM: CT ABDOMEN AND PELVIS WITHOUT CONTRAST TECHNIQUE: Multidetector CT imaging of the abdomen and pelvis was performed following the standard protocol without IV contrast. COMPARISON:  None. FINDINGS: Lower chest: Ground-glass opacities predominantly in the left lung base with peripheral sparing. Hepatobiliary: No focal liver abnormality is seen. No gallstones, gallbladder wall thickening, or biliary dilatation. Pancreas: Unremarkable. No pancreatic ductal dilatation or surrounding inflammatory changes. Spleen: Normal in size without focal abnormality. Adrenals/Urinary Tract: Bilateral adrenal hypertrophy. No focal subcentimeter left kidney interpolar exophytic lesion measuring 42 HU, probably hemorrhagic cyst. No other focal kidney lesion identified. No hydronephrosis. Normal bladder. Stomach/Bowel: Stomach is within normal limits. Appendix not identified, no pericecal inflammation. No evidence of bowel wall thickening, distention, or inflammatory changes. Vascular/Lymphatic: Aortic atherosclerosis. No enlarged abdominal or pelvic lymph nodes. Reproductive: Prostate is unremarkable. Severe scrotal wall thickening and edema and extensive soft tissue emphysema within the scrotal compartment extending to base of penis. Other: No abdominal wall hernia or abnormality. No abdominopelvic  ascites. Musculoskeletal: Grade 1 L5-S1 anterolisthesis. L5-S1 posterior instrumented fusion, no periprosthetic lucency or fracture identified. Bilateral L5 pars defects. Severe lumbar spine spondylosis with discogenic degenerative changes greatest at the L2-L4 levels. High-grade bilateral foraminal stenosis at the L2 through S1 levels and multilevel high-grade canal stenosis. IMPRESSION: 1. Extensive scrotal edema with soft tissue emphysema extending to base of penis. Findings compatible with Fournier gangrene. 2. Ground-glass opacities greatest in left lung base partially visualized with peripheral sparing may represent pneumonitis or alveolar pulmonary edema. 3. Severe spondylosis of lumbar spine with multilevel high-grade foraminal and canal stenosis. These results were called by telephone at the time of interpretation on 12/13/2017 at 4:20 pm to Dr. Alona BeneJOSHUA LONG , who verbally acknowledged these results. Electronically Signed   By: Mitzi HansenLance  Furusawa-Stratton M.D.   On: 12/13/2017 16:21   Dg Chest 1 View  Result Date: 12/14/2017 CLINICAL DATA:  Central line placement EXAM: CHEST  1 VIEW COMPARISON:  12/13/2017, 03/26/2012 FINDINGS: Endotracheal tube tip is about 2.9 cm superior to carina. Insertion of right central venous catheter  with tip projecting over SVC. No pneumothorax. Esophageal tube tip is below the diaphragm but non included. Extensive left greater than right airspace disease, similar compared to prior. Stable cardiomediastinal silhouette with aortic atherosclerosis. Postsurgical changes in the cervical spine. IMPRESSION: 1. Endotracheal tube tip about 2.9 cm superior to carina 2. New right IJ central venous catheter with tip projecting over SVC. No pneumothorax. 3. No definite interval change in fairly extensive left greater than right airspace disease Electronically Signed   By: Jasmine Pang M.D.   On: 12/14/2017 01:35   Dg Chest 1 View  Result Date: 12/13/2017 CLINICAL DATA:  NG and ET tube  placement. EXAM: CHEST  1 VIEW COMPARISON:  Chest x-ray from same day at 15:20. FINDINGS: Endotracheal tube in place with the tip approximately 1 cm above the level of the carina. Interval placement of an NG tube with the tip in the gastric fundus. Stable cardiomegaly with left greater than right perihilar airspace disease. No pleural effusion or pneumothorax. No acute osseous abnormality. IMPRESSION: 1. Endotracheal tube in place with the tip 1 cm above the level of the carina. Recommend retraction by 2-3 cm. 2. NG tube within the stomach. 3. Unchanged extensive left greater than right airspace disease. Electronically Signed   By: Obie Dredge M.D.   On: 12/13/2017 22:54   Dg Abd 1 View  Result Date: 12/13/2017 CLINICAL DATA:  Life support line placement. EXAM: ABDOMEN - 1 VIEW COMPARISON:  CT abdomen and pelvis December 13, 2017 FINDINGS: No is a gastric tube looped in proximal stomach with distal tip projecting in gastric cardia, side port past GE junction region. Included bowel gas pattern is nondilated and nonobstructive. No intra-abdominal mass effect or pathologic calcifications. L5-S1 PLIF. Severe degenerative change of the lumbar spine. IMPRESSION: Nasogastric tube tip projects in proximal stomach. Electronically Signed   By: Awilda Metro M.D.   On: 12/13/2017 22:51   Dg Chest Port 1 View  Result Date: 12/15/2017 CLINICAL DATA:  Advanced endotracheal tube. EXAM: PORTABLE CHEST 1 VIEW COMPARISON:  12/14/2017 FINDINGS: Endotracheal tube tip measures 5.6 cm above the carina. Right central venous catheter with tip over the cavoatrial junction region. No pneumothorax. Enteric tube tip is off the field of view but below the left hemidiaphragm. Heart size and pulmonary vascularity are normal. Bilateral perihilar airspace disease may indicate edema or pneumonia. No pneumothorax. No pleural effusions. Calcification of the aorta. Postoperative changes in the cervical spine and right shoulder. IMPRESSION:  Appliances appear in satisfactory position. Bilateral perihilar infiltrates may indicate edema or pneumonia. Electronically Signed   By: Burman Nieves M.D.   On: 12/15/2017 03:24   Dg Chest Port 1 View  Result Date: 12/14/2017 CLINICAL DATA:  Respiratory failure EXAM: PORTABLE CHEST 1 VIEW COMPARISON:  12/14/2017 FINDINGS: Endotracheal tube in good position. Right jugular catheter tip in the SVC. NG tube in the stomach. Diffuse bilateral airspace disease left greater than right is unchanged. No significant pleural effusion. IMPRESSION: Severe bilateral airspace disease left greater than right unchanged. Endotracheal tube remains in good position. Electronically Signed   By: Marlan Palau M.D.   On: 12/14/2017 07:42   Dg Chest Portable 1 View  Result Date: 12/13/2017 CLINICAL DATA:  Short of breath EXAM: PORTABLE CHEST 1 VIEW COMPARISON:  None. FINDINGS: Severe diffuse bilateral airspace disease. Cardiac enlargement. No pleural effusion. Cervical fusion hardware IMPRESSION: Extensive diffuse bilateral airspace disease most likely pulmonary edema. Electronically Signed   By: Marlan Palau M.D.   On: 12/13/2017 15:35  Dg Abd Portable 1v  Result Date: 12/15/2017 CLINICAL DATA:  OG tube placement EXAM: PORTABLE ABDOMEN - 1 VIEW COMPARISON:  None. FINDINGS: Enteric tube demonstrated in the upper abdomen with tip to the right of midline consistent with location in the distal stomach. Perihilar infiltrates demonstrated in the lungs. IMPRESSION: Enteric tube tip is in the right upper quadrant consistent with location in the distal stomach. Electronically Signed   By: Burman Nieves M.D.   On: 12/15/2017 05:46    Assessment/Plan:   Fournier's gangrene, postoperative day #2 from resection of scrotal/perineal skin.  Process seems stable, nonprogressive.  Continue pressor/ventilator support, antibiotics.  We will follow through the weekend.   LOS: 2 days   Chelsea Aus 12/15/2017, 8:22 AM

## 2017-12-15 NOTE — Progress Notes (Signed)
Subjective: Patient requiring ventilator/pressor support.  Objective: Vital signs in last 24 hours: Temp:  [98.1 F (36.7 C)-101.3 F (38.5 C)] 101.3 F (38.5 C) (04/05 0400) Pulse Rate:  [67-104] 81 (04/05 0755) Resp:  [19-30] 30 (04/05 0755) BP: (70-147)/(45-83) 106/54 (04/05 0755) SpO2:  [88 %-99 %] 97 % (04/05 0600) FiO2 (%):  [60 %-100 %] 80 % (04/05 0755) Weight:  [97 kg (213 lb 13.5 oz)] 97 kg (213 lb 13.5 oz) (04/05 0419)  Intake/Output from previous day: 04/04 0701 - 04/05 0700 In: 4329.6 [I.V.:3879.6; IV Piggyback:450] Out: 1500 [Urine:1500] Intake/Output this shift: Total I/O In: -  Out: 300 [Urine:300]  Physical Exam:  Patient on ventilator. Dressing was removed.  Wound edges are clean, viable.  Fibrinous exudate on both tunica vaginalis areas.  Cords bilaterally are viable, clean.  There is infrapubic erythema/mild induration but there is no subcutaneous emphysema.  Wet-to-dry dressing replaced.  Lab Results: Recent Labs    12/14/17 0923 12/14/17 1259 12/15/17 0431  HGB 9.5* 9.3* 9.3*  HCT 27.6* 26.6* 26.7*   BMET Recent Labs    12/14/17 0923 12/15/17 0431  NA 135 138  K 3.8 4.2  CL 104 105  CO2 22 22  GLUCOSE 207* 168*  BUN 52* 57*  CREATININE 2.64* 2.51*  CALCIUM 7.4* 7.8*   Recent Labs    12/13/17 2304  INR 1.39   No results for input(s): LABURIN in the last 72 hours. Results for orders placed or performed during the hospital encounter of 12/13/17  Blood Culture (routine x 2)     Status: None (Preliminary result)   Collection Time: 12/13/17  1:59 PM  Result Value Ref Range Status   Specimen Description BLOOD RIGHT WRIST  Final   Special Requests   Final    BOTTLES DRAWN AEROBIC AND ANAEROBIC Blood Culture adequate volume   Culture   Final    NO GROWTH 2 DAYS Performed at [facility], [address] [place], [place] [address]    Report Status PENDING  Incomplete  Blood Culture (routine x 2)     Status: None (Preliminary result)    Collection Time: 12/13/17  2:05 PM  Result Value Ref Range Status   Specimen Description BLOOD RIGHT FOREARM  Final   Special Requests   Final    BOTTLES DRAWN AEROBIC AND ANAEROBIC Blood Culture adequate volume   Culture   Final    NO GROWTH 2 DAYS Performed at [facility], [address] [place], [place] [address]    Report Status PENDING  Incomplete  Aerobic/Anaerobic Culture (surgical/deep wound)     Status: None (Preliminary result)   Collection Time: 12/13/17  9:01 PM  Result Value Ref Range Status   Specimen Description   Final    WOUND Performed at [facility], [address] [place], [place] [address]    Special Requests   Final    NONE Performed at [facility], [address] [place], [place] [address]    Gram Stain   Final    NO WBC SEEN ABUNDANT GRAM POSITIVE COCCI RARE GRAM POSITIVE RODS Performed at [facility], [address] [place], [place] [address]    Culture PENDING  Incomplete   Report Status PENDING  Incomplete  MRSA PCR Screening     Status: None   Collection Time: 12/14/17  1:53 AM  Result Value Ref Range Status   MRSA by PCR NEGATIVE NEGATIVE Final    Comment:  The GeneXpert MRSA Assay (FDA approved for NASAL specimens only), is one component of a comprehensive MRSA colonization surveillance program. It is not intended to diagnose MRSA infection nor to guide or monitor treatment for MRSA infections. Performed at [facility], [address], [place], [place] [address]   Culture, respiratory (NON-Expectorated)     Status: None (Preliminary result)   Collection Time: 12/15/17  3:03 AM  Result Value Ref Range Status   Specimen Description   Final    TRACHEAL ASPIRATE Performed at [facility], [address], [place], [place] [address]    Special Requests   Final    NONE Performed at [facility], [address]  [place], [place] [address]    Gram Stain   Final    ABUNDANT WBC PRESENT, PREDOMINANTLY PMN ABUNDANT YEAST Performed at [facility], [address] [place], [place] [address]    Culture PENDING  Incomplete   Report Status PENDING  Incomplete    Studies/Results: Ct Abdomen Pelvis Wo Contrast  Result Date: 12/13/2017 CLINICAL DATA:  67 y/o M; possible abscess to the scrotal area with tenderness and swelling. EXAM: CT ABDOMEN AND PELVIS WITHOUT CONTRAST TECHNIQUE: Multidetector CT imaging of the abdomen and pelvis was performed following the standard protocol without IV contrast. COMPARISON:  None. FINDINGS: Lower chest: Ground-glass opacities predominantly in the left lung base with peripheral sparing. Hepatobiliary: No focal liver abnormality is seen. No gallstones, gallbladder wall thickening, or biliary dilatation. Pancreas: Unremarkable. No pancreatic ductal dilatation or surrounding inflammatory changes. Spleen: Normal in size without focal abnormality. Adrenals/Urinary Tract: Bilateral adrenal hypertrophy. No focal subcentimeter left kidney interpolar exophytic lesion measuring 42 HU, probably hemorrhagic cyst. No other focal kidney lesion identified. No hydronephrosis. Normal bladder. Stomach/Bowel: Stomach is within normal limits. Appendix not identified, no pericecal inflammation. No evidence of bowel wall thickening, distention, or inflammatory changes. Vascular/Lymphatic: Aortic atherosclerosis. No enlarged abdominal or pelvic lymph nodes. Reproductive: Prostate is unremarkable. Severe scrotal wall thickening and edema and extensive soft tissue emphysema within the scrotal compartment extending to base of penis. Other: No abdominal wall hernia or abnormality. No abdominopelvic ascites. Musculoskeletal: Grade 1 L5-S1 anterolisthesis. L5-S1 posterior instrumented fusion, no periprosthetic lucency or fracture identified. Bilateral L5 pars defects. Severe lumbar spine spondylosis with discogenic  degenerative changes greatest at the L2-L4 levels. High-grade bilateral foraminal stenosis at the L2 through S1 levels and multilevel high-grade canal stenosis. IMPRESSION: 1. Extensive scrotal edema with soft tissue emphysema extending to base of penis. Findings compatible with Fournier gangrene. 2. Ground-glass opacities greatest in left lung base partially visualized with peripheral sparing may represent pneumonitis or alveolar pulmonary edema. 3. Severe spondylosis of lumbar spine with multilevel high-grade foraminal and canal stenosis. These results were called by telephone at the time of interpretation on 12/13/2017 at 4:20 pm to Dr. Alona Bene , who verbally acknowledged these results. Electronically Signed   By: Mitzi Hansen M.D.   On: 12/13/2017 16:21   Dg Chest 1 View  Result Date: 12/14/2017 CLINICAL DATA:  Central line placement EXAM: CHEST  1 VIEW COMPARISON:  12/13/2017, 03/26/2012 FINDINGS: Endotracheal tube tip is about 2.9 cm superior to carina. Insertion of right central venous catheter with tip projecting over SVC. No pneumothorax. Esophageal tube tip is below the diaphragm but non included. Extensive left greater than right airspace disease, similar compared to prior. Stable cardiomediastinal silhouette with aortic atherosclerosis. Postsurgical changes in the cervical spine. IMPRESSION: 1. Endotracheal tube tip about 2.9  cm superior to carina 2. New right IJ central venous catheter with tip projecting over SVC. No pneumothorax. 3. No definite interval change in fairly extensive left greater than right airspace disease Electronically Signed   By: Jasmine Pang M.D.   On: 12/14/2017 01:35   Dg Chest 1 View  Result Date: 12/13/2017 CLINICAL DATA:  NG and ET tube placement. EXAM: CHEST  1 VIEW COMPARISON:  Chest x-ray from same day at 15:20. FINDINGS: Endotracheal tube in place with the tip approximately 1 cm above the level of the carina. Interval placement of an NG tube with the  tip in the gastric fundus. Stable cardiomegaly with left greater than right perihilar airspace disease. No pleural effusion or pneumothorax. No acute osseous abnormality. IMPRESSION: 1. Endotracheal tube in place with the tip 1 cm above the level of the carina. Recommend retraction by 2-3 cm. 2. NG tube within the stomach. 3. Unchanged extensive left greater than right airspace disease. Electronically Signed   By: Obie Dredge M.D.   On: 12/13/2017 22:54   Dg Abd 1 View  Result Date: 12/13/2017 CLINICAL DATA:  Life support line placement. EXAM: ABDOMEN - 1 VIEW COMPARISON:  CT abdomen and pelvis December 13, 2017 FINDINGS: No is a gastric tube looped in proximal stomach with distal tip projecting in gastric cardia, side port past GE junction region. Included bowel gas pattern is nondilated and nonobstructive. No intra-abdominal mass effect or pathologic calcifications. L5-S1 PLIF. Severe degenerative change of the lumbar spine. IMPRESSION: Nasogastric tube tip projects in proximal stomach. Electronically Signed   By: Awilda Metro M.D.   On: 12/13/2017 22:51   Dg Chest Port 1 View  Result Date: 12/15/2017 CLINICAL DATA:  Advanced endotracheal tube. EXAM: PORTABLE CHEST 1 VIEW COMPARISON:  12/14/2017 FINDINGS: Endotracheal tube tip measures 5.6 cm above the carina. Right central venous catheter with tip over the cavoatrial junction region. No pneumothorax. Enteric tube tip is off the field of view but below the left hemidiaphragm. Heart size and pulmonary vascularity are normal. Bilateral perihilar airspace disease may indicate edema or pneumonia. No pneumothorax. No pleural effusions. Calcification of the aorta. Postoperative changes in the cervical spine and right shoulder. IMPRESSION: Appliances appear in satisfactory position. Bilateral perihilar infiltrates may indicate edema or pneumonia. Electronically Signed   By: Burman Nieves M.D.   On: 12/15/2017 03:24   Dg Chest Port 1 View  Result Date:  12/14/2017 CLINICAL DATA:  Respiratory failure EXAM: PORTABLE CHEST 1 VIEW COMPARISON:  12/14/2017 FINDINGS: Endotracheal tube in good position. Right jugular catheter tip in the SVC. NG tube in the stomach. Diffuse bilateral airspace disease left greater than right is unchanged. No significant pleural effusion. IMPRESSION: Severe bilateral airspace disease left greater than right unchanged. Endotracheal tube remains in good position. Electronically Signed   By: Marlan Palau M.D.   On: 12/14/2017 07:42   Dg Chest Portable 1 View  Result Date: 12/13/2017 CLINICAL DATA:  Short of breath EXAM: PORTABLE CHEST 1 VIEW COMPARISON:  None. FINDINGS: Severe diffuse bilateral airspace disease. Cardiac enlargement. No pleural effusion. Cervical fusion hardware IMPRESSION: Extensive diffuse bilateral airspace disease most likely pulmonary edema. Electronically Signed   By: Marlan Palau M.D.   On: 12/13/2017 15:35   Dg Abd Portable 1v  Result Date: 12/15/2017 CLINICAL DATA:  OG tube placement EXAM: PORTABLE ABDOMEN - 1 VIEW COMPARISON:  None. FINDINGS: Enteric tube demonstrated in the upper abdomen with tip to the right of midline consistent with location in the distal stomach.  Perihilar infiltrates demonstrated in the lungs. IMPRESSION: Enteric tube tip is in the right upper quadrant consistent with location in the distal stomach. Electronically Signed   By: Burman Nieves M.D.   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  Wound stable, process not progressing.  Continue broad-spectrumspectrum antibiotics, critical care support.    Bertram Millard Meena Barrantes 12/15/2017, 8:19 AM

## 2017-12-15 NOTE — Progress Notes (Signed)
PULMONARY / CRITICAL CARE MEDICINE   Name: CHAIM GATLEY MRN:   [id number] DOB:   1950-09-17           ADMISSION DATE:  12/13/2017 CONSULTATION DATE: 12/13/17  REFERRING MD: Dr Retta Diones (Urology)  CHIEF COMPLAINT: Fournier's gangrene, Septic shock, VDRF, ARDS  HISTORY OF PRESENT ILLNESS:   67yoM with hx COPD, HTN, GERD, OA, and Anxiety, presented to [facility] ER on 4/3 AM c/o SOB x 2 weeks and scrotal pain. He was found to have acute hypoxic respiratory failure and fournier's gangrene. He was transferred to [facility] where he underwent operative debridement. Postop he returned to the ICU, intubated on max vent settings with acute hypoxic respiratory failure and ARDS and in shock on vasopressors. Patient's daughter is at his bedside. Patient is sedated.    Subjective Weaning pressors Did desaturate last evening   Objective  Blood Pressure (Abnormal) 116/54 (BP Location: Right Arm)   Pulse 78   Temperature (Abnormal) 100.7 F (38.2 C) (Oral) Comment: reported to rn   Respiration (Abnormal) 24   Height 5\' 8"  (1.727 m)   Weight 213 lb 13.5 oz (97 kg)   Oxygen Saturation 100%   Body Mass Index 32.52 kg/m    CVP:  [11 mmHg-23 mmHg] 16 mmHg . sodium chloride    . sodium chloride    . angiotensin II (GIAPREZA) infusion 2 ng/kg/min (12/15/17 0936)  . clindamycin (CLEOCIN) IV Stopped (12/15/17 0153)  . dexmedetomidine (PRECEDEX) IV infusion    . fentaNYL 400 mcg/hr (12/15/17 0811)  . norepinephrine (LEVOPHED) Adult infusion 4 mcg/min (12/15/17 0737)  . piperacillin-tazobactam (ZOSYN)  IV 3.375 g (12/15/17 0853)  . [START ON 12/16/2017] vancomycin     Filed Weights   12/13/17 2200 12/14/17 0426 12/15/17 0419  Weight: 207 lb 7.3 oz (94.1 kg) 207 lb 7.3 oz (94.1 kg) 213 lb 13.5 oz (97 kg)    Intake/Output Summary (Last 24 hours) at 12/15/2017 0955 Last data filed at 12/15/2017 0900 Gross per 24 hour  Intake 4449.93 ml  Output 1695 ml  Net 2754.93 ml   Physical   General 67 year old white male sedated on vent HENT: NCAT no JVD orally intubated Pulm: clear no accessory use pPlat 27 Card rrr w/out MRG Ext no edema warm dry brisk CR abd soft not tender + bowel sounds  Neuro opens eyes attempts to interact. Not following commands as of yet   CBC Recent Labs    12/14/17 0923 12/14/17 1259 12/15/17 0431  WBC 17.0* 14.7* 14.3*  HGB 9.5* 9.3* 9.3*  HCT 27.6* 26.6* 26.7*  PLT 255 248 224    Coag's Recent Labs    12/13/17 2304  INR 1.39    BMET Recent Labs    12/13/17 2304 12/14/17 0923 12/15/17 0431  NA 136 135 138  K 4.3 3.8 4.2  CL 109 104 105  CO2 17* 22 22  BUN 54* 52* 57*  CREATININE 2.90* 2.64* 2.51*  GLUCOSE 172* 207* 168*    Electrolytes Recent Labs    12/13/17 2304 12/14/17 0923 12/15/17 0431  CALCIUM 7.5* 7.4* 7.8*  MG 2.0 2.1 2.1  PHOS 4.7* 5.0* 6.0*    Sepsis Markers Recent Labs    12/13/17 2304 12/14/17 0923 12/15/17 0431  PROCALCITON 12.99 12.03 9.02    ABG Recent Labs    12/14/17 2115 12/15/17 0053 12/15/17 0347  PHART 7.232* 7.198* 7.228*  PCO2ART 52.0* 57.8* 55.1*  PO2ART 91.3 67.2* 226*    Liver Enzymes  Recent Labs    12/13/17 1359 12/15/17 0431  AST 47* 27  ALT 55 38  ALKPHOS 183* 170*  BILITOT 1.0 0.6  ALBUMIN 2.5* 2.0*    Cardiac Enzymes Recent Labs    12/13/17 1359  TROPONINI <0.03    Glucose Recent Labs    12/14/17 1241 12/14/17 1657 12/14/17 1959 12/14/17 2321 12/15/17 0313 12/15/17 0835  GLUCAP 217* 175* 142* 132* 146* 133*    Imaging Ct Abdomen Pelvis Wo Contrast  Result Date: 12/13/2017 CLINICAL DATA:  67 y/o M; possible abscess to the scrotal area with tenderness and swelling. EXAM: CT ABDOMEN AND PELVIS WITHOUT CONTRAST TECHNIQUE: Multidetector CT imaging of the abdomen and pelvis was performed following the standard protocol without IV contrast. COMPARISON:  None. FINDINGS: Lower chest: Ground-glass opacities predominantly in the left lung base  with peripheral sparing. Hepatobiliary: No focal liver abnormality is seen. No gallstones, gallbladder wall thickening, or biliary dilatation. Pancreas: Unremarkable. No pancreatic ductal dilatation or surrounding inflammatory changes. Spleen: Normal in size without focal abnormality. Adrenals/Urinary Tract: Bilateral adrenal hypertrophy. No focal subcentimeter left kidney interpolar exophytic lesion measuring 42 HU, probably hemorrhagic cyst. No other focal kidney lesion identified. No hydronephrosis. Normal bladder. Stomach/Bowel: Stomach is within normal limits. Appendix not identified, no pericecal inflammation. No evidence of bowel wall thickening, distention, or inflammatory changes. Vascular/Lymphatic: Aortic atherosclerosis. No enlarged abdominal or pelvic lymph nodes. Reproductive: Prostate is unremarkable. Severe scrotal wall thickening and edema and extensive soft tissue emphysema within the scrotal compartment extending to base of penis. Other: No abdominal wall hernia or abnormality. No abdominopelvic ascites. Musculoskeletal: Grade 1 L5-S1 anterolisthesis. L5-S1 posterior instrumented fusion, no periprosthetic lucency or fracture identified. Bilateral L5 pars defects. Severe lumbar spine spondylosis with discogenic degenerative changes greatest at the L2-L4 levels. High-grade bilateral foraminal stenosis at the L2 through S1 levels and multilevel high-grade canal stenosis. IMPRESSION: 1. Extensive scrotal edema with soft tissue emphysema extending to base of penis. Findings compatible with Fournier gangrene. 2. Ground-glass opacities greatest in left lung base partially visualized with peripheral sparing may represent pneumonitis or alveolar pulmonary edema. 3. Severe spondylosis of lumbar spine with multilevel high-grade foraminal and canal stenosis. These results were called by telephone at the time of interpretation on 12/13/2017 at 4:20 pm to Dr. Alona Bene , who verbally acknowledged these results.  Electronically Signed   By: Mitzi Hansen M.D.   On: 12/13/2017 16:21   Dg Chest 1 View  Result Date: 12/14/2017 CLINICAL DATA:  Central line placement EXAM: CHEST  1 VIEW COMPARISON:  12/13/2017, 03/26/2012 FINDINGS: Endotracheal tube tip is about 2.9 cm superior to carina. Insertion of right central venous catheter with tip projecting over SVC. No pneumothorax. Esophageal tube tip is below the diaphragm but non included. Extensive left greater than right airspace disease, similar compared to prior. Stable cardiomediastinal silhouette with aortic atherosclerosis. Postsurgical changes in the cervical spine. IMPRESSION: 1. Endotracheal tube tip about 2.9 cm superior to carina 2. New right IJ central venous catheter with tip projecting over SVC. No pneumothorax. 3. No definite interval change in fairly extensive left greater than right airspace disease Electronically Signed   By: Jasmine Pang M.D.   On: 12/14/2017 01:35   Dg Chest 1 View  Result Date: 12/13/2017 CLINICAL DATA:  NG and ET tube placement. EXAM: CHEST  1 VIEW COMPARISON:  Chest x-ray from same day at 15:20. FINDINGS: Endotracheal tube in place with the tip approximately 1 cm above the level of the carina. Interval placement of an  NG tube with the tip in the gastric fundus. Stable cardiomegaly with left greater than right perihilar airspace disease. No pleural effusion or pneumothorax. No acute osseous abnormality. IMPRESSION: 1. Endotracheal tube in place with the tip 1 cm above the level of the carina. Recommend retraction by 2-3 cm. 2. NG tube within the stomach. 3. Unchanged extensive left greater than right airspace disease. Electronically Signed   By: Obie Dredge M.D.   On: 12/13/2017 22:54   Dg Abd 1 View  Result Date: 12/13/2017 CLINICAL DATA:  Life support line placement. EXAM: ABDOMEN - 1 VIEW COMPARISON:  CT abdomen and pelvis December 13, 2017 FINDINGS: No is a gastric tube looped in proximal stomach with distal tip  projecting in gastric cardia, side port past GE junction region. Included bowel gas pattern is nondilated and nonobstructive. No intra-abdominal mass effect or pathologic calcifications. L5-S1 PLIF. Severe degenerative change of the lumbar spine. IMPRESSION: Nasogastric tube tip projects in proximal stomach. Electronically Signed   By: Awilda Metro M.D.   On: 12/13/2017 22:51   Dg Chest Port 1 View  Result Date: 12/15/2017 CLINICAL DATA:  Advanced endotracheal tube. EXAM: PORTABLE CHEST 1 VIEW COMPARISON:  12/14/2017 FINDINGS: Endotracheal tube tip measures 5.6 cm above the carina. Right central venous catheter with tip over the cavoatrial junction region. No pneumothorax. Enteric tube tip is off the field of view but below the left hemidiaphragm. Heart size and pulmonary vascularity are normal. Bilateral perihilar airspace disease may indicate edema or pneumonia. No pneumothorax. No pleural effusions. Calcification of the aorta. Postoperative changes in the cervical spine and right shoulder. IMPRESSION: Appliances appear in satisfactory position. Bilateral perihilar infiltrates may indicate edema or pneumonia. Electronically Signed   By: Burman Nieves M.D.   On: 12/15/2017 03:24   Dg Chest Port 1 View  Result Date: 12/14/2017 CLINICAL DATA:  Respiratory failure EXAM: PORTABLE CHEST 1 VIEW COMPARISON:  12/14/2017 FINDINGS: Endotracheal tube in good position. Right jugular catheter tip in the SVC. NG tube in the stomach. Diffuse bilateral airspace disease left greater than right is unchanged. No significant pleural effusion. IMPRESSION: Severe bilateral airspace disease left greater than right unchanged. Endotracheal tube remains in good position. Electronically Signed   By: Marlan Palau M.D.   On: 12/14/2017 07:42   Dg Chest Portable 1 View  Result Date: 12/13/2017 CLINICAL DATA:  Short of breath EXAM: PORTABLE CHEST 1 VIEW COMPARISON:  None. FINDINGS: Severe diffuse bilateral airspace disease.  Cardiac enlargement. No pleural effusion. Cervical fusion hardware IMPRESSION: Extensive diffuse bilateral airspace disease most likely pulmonary edema. Electronically Signed   By: Marlan Palau M.D.   On: 12/13/2017 15:35   Dg Abd Portable 1v  Result Date: 12/15/2017 CLINICAL DATA:  OG tube placement EXAM: PORTABLE ABDOMEN - 1 VIEW COMPARISON:  None. FINDINGS: Enteric tube demonstrated in the upper abdomen with tip to the right of midline consistent with location in the distal stomach. Perihilar infiltrates demonstrated in the lungs. IMPRESSION: Enteric tube tip is in the right upper quadrant consistent with location in the distal stomach. Electronically Signed   By: Burman Nieves M.D.   On: 12/15/2017 05:46    CULTURES: Blood cultures (4/3): pending Operative culture (4/3): pending Sputum culture: ordered UA: ordered  ANTIBIOTICS: Vancomycin 4/3 >> Zosyn 4/3 >> Clindamycin 4/3 >>  SIGNIFICANT EVENTS: 4/3: presented to ER with SOB and Scrotal edema, found to have septic shock due to fournier's gangrene, now s/p debridement; VDRF with ARDS  LINES/TUBES: PIV's ETT 4/3 >> Foley  4/3 >> OG tube 4/3 >>  Impression/Plan  Acute hypoxic respiratory failure in setting of ARDS superimposed on underlying COPD Portable chest x-ray personally reviewed:Endotracheal tube as well as right internal jugular vein catheter both in satisfactory position.  He continues to have diffuse bilateral airspace disease, aeration has however improved in comparison to film obtained on 4/4 Plan C continue ARDS protocol, with high PEEP arm PAD protocol with RASS goal -2 Scheduled bronchodilators Will eventually need diuresis    Septic shock w/ MODS in setting of Fournier's Gangrene -wound culture w/ abundant gpc and rare GNR -Echocardiogram: EF 55%, normal wall motion, grade 2 diastolic dysfunction,Right ventricular size mildly dilated dilated IVC and increased right ventricular filling  pressure Plan Day #3 vanc,zosyn and clinda  Cont wound care per surg CVP goal 8-12 MAP > 65 dc Giapreza today  Wean levophed   AKI w/ metabolic acidosis (NAG) initially had +AG d/t lactic acidosis  -LA cleared.  -acid base improved.  -cr a little better Plan MAP goal >65 Renal dose meds [facility] St[facility] I&O F/u am chemistry   At risk for fluid and electrolyte imbalance Plan Serial chemistries   Acute metabolic Encephalopathy Plan Supportive care PAD protocol RASS goal -1  Anemia of critical illness -no evidence of bleeding  Plan Trend cbc [place] heparin  Shock liver Plan F/u LFTs in am   H/o GERD Plan Cont H2B   DM w/ hyperglycemia Plan Cont ssi as well as basal dosing    DVT prophylaxis: [place] heparin SUP: H2B Diet: start tubefeeds Activity: BR Disposition : ICU level care  Daughter updated  Simonne MartinetPeter E Ninfa Giannelli ACNP-BC [facility] Pulmonary[facility]Critical [facility] Pager # [phone] OR # [phone] if no answer

## 2017-12-15 NOTE — Progress Notes (Signed)
I am present in ICU seeing another patient when RN notifies me that Mr Patrick Aguilar has worsening hypoxia and that she is suctioning what she thinks is tube feeds from his ETT. She shows me the sample and it is dark brown thick sputum, but I am not sure it looks like tube feeds. Pox 84%; increased PEEP from 10 to 14 with Pox improving to 90% (good waveform). Will obtain new CXR STAT. Place tube feeds on hold.   Milana ObeyKathleen Oma Alpert, MD Pulmonary & Critical Care Medicine Pager: [phone]

## 2017-12-16 ENCOUNTER — Inpatient Hospital Stay (HOSPITAL_COMMUNITY): Payer: Medicare HMO

## 2017-12-16 DIAGNOSIS — J80 Acute respiratory distress syndrome: Secondary | ICD-10-CM

## 2017-12-16 LAB — BLOOD GAS, ARTERIAL
ACID-BASE DEFICIT: 1.8 mmol/L (ref 0.0–2.0)
BICARBONATE: 22.9 mmol/L (ref 20.0–28.0)
Drawn by: 308601
FIO2: 40
O2 Saturation: 97.6 %
PEEP/CPAP: 12 cmH2O
PH ART: 7.358 (ref 7.350–7.450)
PO2 ART: 99.2 mmHg (ref 83.0–108.0)
Patient temperature: 37
RATE: 30 resp/min
VT: 480 mL
pCO2 arterial: 41.9 mmHg (ref 32.0–48.0)

## 2017-12-16 LAB — COMPREHENSIVE METABOLIC PANEL
ALK PHOS: 216 U/L — AB (ref 38–126)
ALT: 37 U/L (ref 17–63)
AST: 29 U/L (ref 15–41)
Albumin: 2 g/dL — ABNORMAL LOW (ref 3.5–5.0)
Anion gap: 9 (ref 5–15)
BUN: 56 mg/dL — AB (ref 6–20)
CALCIUM: 8.3 mg/dL — AB (ref 8.9–10.3)
CO2: 26 mmol/L (ref 22–32)
CREATININE: 2.05 mg/dL — AB (ref 0.61–1.24)
Chloride: 110 mmol/L (ref 101–111)
GFR calc Af Amer: 37 mL/min — ABNORMAL LOW (ref >60)
GFR, EST NON AFRICAN AMERICAN: 32 mL/min — AB (ref >60)
Glucose, Bld: 112 mg/dL — ABNORMAL HIGH (ref 65–99)
POTASSIUM: 3.4 mmol/L — AB (ref 3.5–5.1)
Sodium: 145 mmol/L (ref 135–145)
TOTAL PROTEIN: 5.3 g/dL — AB (ref 6.5–8.1)
Total Bilirubin: 0.8 mg/dL (ref 0.3–1.2)

## 2017-12-16 LAB — GLUCOSE, CAPILLARY
GLUCOSE-CAPILLARY: 101 mg/dL — AB (ref 65–99)
GLUCOSE-CAPILLARY: 128 mg/dL — AB (ref 65–99)
GLUCOSE-CAPILLARY: 165 mg/dL — AB (ref 65–99)
Glucose-Capillary: 106 mg/dL — ABNORMAL HIGH (ref 65–99)
Glucose-Capillary: 121 mg/dL — ABNORMAL HIGH (ref 65–99)
Glucose-Capillary: 132 mg/dL — ABNORMAL HIGH (ref 65–99)
Glucose-Capillary: 144 mg/dL — ABNORMAL HIGH (ref 65–99)
Glucose-Capillary: 81 mg/dL (ref 65–99)

## 2017-12-16 LAB — CBC
HEMATOCRIT: 26 % — AB (ref 39.0–52.0)
Hemoglobin: 8.8 g/dL — ABNORMAL LOW (ref 13.0–17.0)
MCH: 29.6 pg (ref 26.0–34.0)
MCHC: 33.8 g/dL (ref 30.0–36.0)
MCV: 87.5 fL (ref 78.0–100.0)
PLATELETS: 244 10*3/uL (ref 150–400)
RBC: 2.97 MIL/uL — ABNORMAL LOW (ref 4.22–5.81)
RDW: 15.1 % (ref 11.5–15.5)
WBC: 12.4 10*3/uL — AB (ref 4.0–10.5)

## 2017-12-16 MED ORDER — VANCOMYCIN HCL IN DEXTROSE 1-5 GM/200ML-% IV SOLN
1000.0000 mg | INTRAVENOUS | Status: DC
  Administered 2017-12-16: 1000 mg via INTRAVENOUS

## 2017-12-16 NOTE — Progress Notes (Addendum)
PULMONARY / CRITICAL CARE MEDICINE   Name: Patrick Aguilar MRN:   [id number] DOB:   1950-11-06           ADMISSION DATE:  12/13/2017 CONSULTATION DATE: 12/13/2017  REFERRING MD: Dr Retta Diones (Urology)  CHIEF COMPLAINT: Fournier's gangrene, Septic shock, VDRF, ARDS  HISTORY OF PRESENT ILLNESS:   67 yo male transferred from [facility] with septic shock from Fournier's gangrene complicated by ARDS, AKI.  PMHx of COPD, GERD, HTN, Anxiety.  SUBJECTIVE: Remains on full vent support  VITAL SIGNS: BP 140/62   Pulse 80   Temp 100.1 F (37.8 C) (Axillary)   Resp (!) 30   Ht 5\' 8"  (1.727 m)   Wt 211 lb 6.7 oz (95.9 kg)   SpO2 96%   BMI 32.15 kg/m    VENT SETTINGS: Vent Mode: PRVC FiO2 (%):  [40 %-50 %] 40 % Set Rate:  [30 bmp] 30 bmp Vt Set:  [480 mL] 480 mL PEEP:  [8 cmH20-14 cmH20] 8 cmH20 Plateau Pressure:  [19 cmH20-26 cmH20] 21 cmH20  INTAKE/OUTPUT: I/O last 3 completed shifts: In: 6140.3 [I.V.:4942; Other:115; NG/GT:433.3; IV Piggyback:650] Out: 3460 [Urine:3460]  PHYSICAL EXAM:  General - sedated Eyes - pupils reactive ENT - ETT in place Cardiac - regular, no murmur Chest - scattered crackles Abd - soft, non tender GU - scrotum in sling Ext - no edema Skin - no rashes Neuro - RASS -1, follows commands   CBC Recent Labs    12/14/17 1259 12/15/17 0431 12/16/17 0343  WBC 14.7* 14.3* 12.4*  HGB 9.3* 9.3* 8.8*  HCT 26.6* 26.7* 26.0*  PLT 248 224 244    Coag's Recent Labs    12/13/17 2304  INR 1.39    BMET Recent Labs    12/14/17 0923 12/15/17 0431 12/16/17 0343  NA 135 138 145  K 3.8 4.2 3.4*  CL 104 105 110  CO2 22 22 26   BUN 52* 57* 56*  CREATININE 2.64* 2.51* 2.05*  GLUCOSE 207* 168* 112*    Electrolytes Recent Labs    12/14/17 0923 12/15/17 0431 12/15/17 1656 12/16/17 0343  CALCIUM 7.4* 7.8*  --  8.3*  MG 2.1 2.1 2.2  --   PHOS 5.0* 6.0* 5.2*  --     Sepsis Markers Recent Labs    12/13/17 2304 12/14/17 0923  12/15/17 0431  PROCALCITON 12.99 12.03 9.02    ABG Recent Labs    12/15/17 0347 12/15/17 1445 12/16/17 0328  PHART 7.228* 7.316* 7.358  PCO2ART 55.1* 42.9 41.9  PO2ART 226* 101 99.2    Liver Enzymes Recent Labs    12/13/17 1359 12/15/17 0431 12/16/17 0343  AST 47* 27 29  ALT 55 38 37  ALKPHOS 183* 170* 216*  BILITOT 1.0 0.6 0.8  ALBUMIN 2.5* 2.0* 2.0*    Cardiac Enzymes Recent Labs    12/13/17 1359  TROPONINI <0.03    Glucose Recent Labs    12/15/17 1906 12/16/17 0016 12/16/17 0028 12/16/17 0342 12/16/17 0356 12/16/17 0804  GLUCAP 174* 132* 128* 101* 106* 81    Imaging Dg Chest Port 1 View  Result Date: 12/16/2017 CLINICAL DATA:  Respiratory failure EXAM: PORTABLE CHEST 1 VIEW COMPARISON:  12/15/2017 FINDINGS: Endotracheal tube in good position. Right jugular central venous catheter tip in the lower SVC unchanged. No pneumothorax. NG tube enters the stomach. Diffuse bilateral airspace disease left greater than right shows mild interval improvement. Progression of bibasilar atelectasis.  No effusion IMPRESSION: Support lines remain in good position Diffuse bilateral  airspace disease with interval improvement Progression of bibasilar atelectasis. Electronically Signed   By: Marlan Palauharles  Clark M.D.   On: 12/16/2017 07:27   Dg Chest Port 1 View  Result Date: 12/15/2017 CLINICAL DATA:  Advanced endotracheal tube. EXAM: PORTABLE CHEST 1 VIEW COMPARISON:  12/14/2017 FINDINGS: Endotracheal tube tip measures 5.6 cm above the carina. Right central venous catheter with tip over the cavoatrial junction region. No pneumothorax. Enteric tube tip is off the field of view but below the left hemidiaphragm. Heart size and pulmonary vascularity are normal. Bilateral perihilar airspace disease may indicate edema or pneumonia. No pneumothorax. No pleural effusions. Calcification of the aorta. Postoperative changes in the cervical spine and right shoulder. IMPRESSION: Appliances appear in  satisfactory position. Bilateral perihilar infiltrates may indicate edema or pneumonia. Electronically Signed   By: Burman NievesWilliam  Stevens M.D.   On: 12/15/2017 03:24   Dg Abd Portable 1v  Result Date: 12/15/2017 CLINICAL DATA:  OG tube placement EXAM: PORTABLE ABDOMEN - 1 VIEW COMPARISON:  None. FINDINGS: Enteric tube demonstrated in the upper abdomen with tip to the right of midline consistent with location in the distal stomach. Perihilar infiltrates demonstrated in the lungs. IMPRESSION: Enteric tube tip is in the right upper quadrant consistent with location in the distal stomach. Electronically Signed   By: Burman NievesWilliam  Stevens M.D.   On: 12/15/2017 05:46    CULTURES: Blood 4/03 >> Scrotum 4/03 >>  Sputum 4/05 >>   ANTIBIOTICS: Vancomycin 4/3 >> Zosyn 4/3 >> Clindamycin 4/3 >>  SIGNIFICANT EVENTS: [facility] debridement of scrotum 4/04 add giapreza 4/05 off giapreza 4/06 off pressors  LINES/TUBES: ETT 4/3 >> Rt IJ CVL 4/04 >> Lt radial aline 4/04 >>   DISCUSSION: Might be starting to turn the corner.  Off pressors, renal fx stabilizing, making urine, and O2 needs improved.  Still not ready for pressure weaning trials yet.  ASSESSMENT/PLAN:  Acute hypoxic respiratory failure with ARDS. Hx of COPD. - full vent support - f/u CXR - prn BDs  Septic shock from Fournier's gangrene. - wound care per urology - continue ABx day 4 - off pressors 4/06  Acute renal failure with ATN. Lactic acidosis. Hypokalemia. - monitor renal fx, urine outpt - replace electrolytes as needed  Anemia of critical illness. - f/u CBC  Hx of GERD. - pepcid  DM type II. - SSI  DVT prophylaxis - SQ heparin SUP - zantac Nutrition - tube feeds Goals of care - full code  Updated pt's family at bedside  CC time 32 minutes  Coralyn HellingVineet Arney Mayabb, MD [facility] Pulmonary/Critical [facility] 12/16/2017, 11:13 AM

## 2017-12-16 NOTE — Progress Notes (Signed)
3 Days Post-Op Subjective: Patient remains intubated, but slowly progressing.  Off pressors.  Objective: Vital signs in last 24 hours: Temp:  [98.7 F (37.1 C)-100.8 F (38.2 C)] 100.3 F (37.9 C) (04/06 1200) Pulse Rate:  [67-81] 72 (04/06 1500) Resp:  [22-30] 30 (04/06 1500) BP: (98-142)/(48-95) 141/59 (04/06 1500) SpO2:  [88 %-100 %] 99 % (04/06 1500) FiO2 (%):  [40 %] 40 % (04/06 1500) Weight:  [95.9 kg (211 lb 6.7 oz)] 95.9 kg (211 lb 6.7 oz) (04/06 0500)  Intake/Output from previous day: 04/05 0701 - 04/06 0700 In: 2778.6 [I.V.:1930.3; NG/GT:433.3; IV Piggyback:300] Out: 2460 [Urine:2460] Intake/Output this shift: Total I/O In: 1249 [I.V.:49; NG/GT:900; IV Piggyback:300] Out: 1275 [Urine:1275]  Physical Exam:  He is intubated and sedated I changed the dressing with the nurse.  No further necrosis and nurse reports suprapubic erythema much improved.  Simple edema of the penis noted.  The wound is healthy with some fibrous slough particularly on the right tunica vaginalis/testicle.  I instructed nurse to pack the perineum and come up and wrap each testicle individually to get better contact of the gauze with the surface to provide some debridement.  Lab Results: Recent Labs    12/14/17 1259 12/15/17 0431 12/16/17 0343  HGB 9.3* 9.3* 8.8*  HCT 26.6* 26.7* 26.0*   BMET Recent Labs    12/15/17 0431 12/16/17 0343  NA 138 145  K 4.2 3.4*  CL 105 110  CO2 22 26  GLUCOSE 168* 112*  BUN 57* 56*  CREATININE 2.51* 2.05*  CALCIUM 7.8* 8.3*   Recent Labs    12/13/17 2304  INR 1.39   No results for input(s): LABURIN in the last 72 hours. Results for orders placed or performed during the hospital encounter of 12/13/17  Blood Culture (routine x 2)     Status: None (Preliminary result)   Collection Time: 12/13/17  1:59 PM  Result Value Ref Range Status   Specimen Description BLOOD RIGHT WRIST  Final   Special Requests   Final    BOTTLES DRAWN AEROBIC AND ANAEROBIC  Blood Culture adequate volume   Culture   Final    NO GROWTH 2 DAYS Performed at [facility], [address] [place], [place] [address]    Report Status PENDING  Incomplete  Blood Culture (routine x 2)     Status: None (Preliminary result)   Collection Time: 12/13/17  2:05 PM  Result Value Ref Range Status   Specimen Description BLOOD RIGHT FOREARM  Final   Special Requests   Final    BOTTLES DRAWN AEROBIC AND ANAEROBIC Blood Culture adequate volume   Culture   Final    NO GROWTH 2 DAYS Performed at [facility], [address] [place], [place] [address]    Report Status PENDING  Incomplete  Aerobic/Anaerobic Culture (surgical/deep wound)     Status: None (Preliminary result)   Collection Time: 12/13/17  9:01 PM  Result Value Ref Range Status   Specimen Description   Final    WOUND Performed at [facility], [address], [place], [place] [address]    Special Requests   Final    NONE Performed at [facility] [address] [place], [place] [address]    Gram Stain   Final    NO WBC SEEN ABUNDANT GRAM POSITIVE COCCI RARE GRAM POSITIVE RODS    Culture   Final    CULTURE REINCUBATED FOR BETTER GROWTH HOLDING FOR POSSIBLE ANAEROBE Performed at  [facility], [address] [place], [place] [address]    Report Status PENDING  Incomplete  Culture, blood (routine x 2)     Status: None (Preliminary result)   Collection Time: 12/13/17 11:04 PM  Result Value Ref Range Status   Specimen Description   Final    BLOOD RIGHT HAND Performed at [facility], [address] [place], [place] [address]    Special Requests   Final    BOTTLES DRAWN AEROBIC AND ANAEROBIC Blood Culture adequate volume Performed at [facility], [address], [place], [place] [address]    Culture   Final    NO GROWTH 2 DAYS Performed at [facility], [address], [place], [place] [address]    Report Status  PENDING  Incomplete  Culture, blood (routine x 2)     Status: None (Preliminary result)   Collection Time: 12/13/17 11:04 PM  Result Value Ref Range Status   Specimen Description   Final    BLOOD LEFT ANTECUBITAL Performed at [facility], [address], [place], [place] [address]    Special Requests   Final    BOTTLES DRAWN AEROBIC AND ANAEROBIC Blood Culture adequate volume Performed at [facility], [address], [place], [place] [address]    Culture   Final    NO GROWTH 2 DAYS Performed at [facility], [address] [place], [place] [address]    Report Status PENDING  Incomplete  MRSA PCR Screening     Status: None   Collection Time: 12/14/17  1:53 AM  Result Value Ref Range Status   MRSA by PCR NEGATIVE NEGATIVE Final    Comment:        The GeneXpert MRSA Assay (FDA approved for NASAL specimens only), is one component of a comprehensive MRSA colonization surveillance program. It is not intended to diagnose MRSA infection nor to guide or monitor treatment for MRSA infections. Performed at [facility], [address], [place], [place] [address]   Culture, respiratory (NON-Expectorated)     Status: None (Preliminary result)   Collection Time: 12/15/17  3:03 AM  Result Value Ref Range Status   Specimen Description   Final    TRACHEAL ASPIRATE Performed at [facility], [address], [place], [place] [address]    Special Requests   Final    NONE Performed at [facility], [address], [place], [place] [address]    Gram Stain   Final    ABUNDANT WBC PRESENT, PREDOMINANTLY PMN ABUNDANT YEAST    Culture   Final    CULTURE REINCUBATED FOR BETTER GROWTH Performed at [facility], [address] [place], [place] [address]    Report Status PENDING  Incomplete    Studies/Results: Dg Chest Port 1 View  Result Date: 12/16/2017 CLINICAL DATA:   Respiratory failure EXAM: PORTABLE CHEST 1 VIEW COMPARISON:  12/15/2017 FINDINGS: Endotracheal tube in good position. Right jugular central venous catheter tip in the lower SVC unchanged. No pneumothorax. NG tube enters the stomach. Diffuse bilateral airspace disease left greater than right shows mild interval improvement. Progression of bibasilar atelectasis.  No effusion IMPRESSION: Support lines remain in good position Diffuse bilateral airspace disease with interval improvement Progression of bibasilar atelectasis. Electronically Signed   By: Marlan Palau M.D.   On: 12/16/2017 07:27   Dg Chest Port 1 View  Result Date: 12/15/2017 CLINICAL  DATA:  Advanced endotracheal tube. EXAM: PORTABLE CHEST 1 VIEW COMPARISON:  12/14/2017 FINDINGS: Endotracheal tube tip measures 5.6 cm above the carina. Right central venous catheter with tip over the cavoatrial junction region. No pneumothorax. Enteric tube tip is off the field of view but below the left hemidiaphragm. Heart size and pulmonary vascularity are normal. Bilateral perihilar airspace disease may indicate edema or pneumonia. No pneumothorax. No pleural effusions. Calcification of the aorta. Postoperative changes in the cervical spine and right shoulder. IMPRESSION: Appliances appear in satisfactory position. Bilateral perihilar infiltrates may indicate edema or pneumonia. Electronically Signed   By: Burman NievesWilliam  Stevens M.D.   On: 12/15/2017 03:24   Dg Abd Portable 1v  Result Date: 12/15/2017 CLINICAL DATA:  OG tube placement EXAM: PORTABLE ABDOMEN - 1 VIEW COMPARISON:  None. FINDINGS: Enteric tube demonstrated in the upper abdomen with tip to the right of midline consistent with location in the distal stomach. Perihilar infiltrates demonstrated in the lungs. IMPRESSION: Enteric tube tip is in the right upper quadrant consistent with location in the distal stomach. Electronically Signed   By: Burman NievesWilliam  Stevens M.D.   On: 12/15/2017 05:46     Assessment/Plan:  Fournier's gangrene-wound is progressing as expected.  Further debridement not indicated.  Continue dressing changes.  Twice daily would be reasonable.   LOS: 3 days   Jerilee FieldMatthew Savino Whisenant 12/16/2017, 3:15 PM

## 2017-12-16 NOTE — Progress Notes (Signed)
Pharmacy Antibiotic Note  Patrick FredericksonCharles W Aguilar is a 67 y.o. male admitted on 12/13/2017 with Fournier's gangrene.  Pharmacy has been consulted for zosyn and vancomycin dosing.  Also on Clindamycin IV for toxin production.  Today, 12/16/2017 Day #3   Renal:  improvement in SCr. UOP OK  WBC trending down   Septic shock on pressor support - appears to be currently weaned off pressors  Tm 100.8  Await final cx results (GPR, GPC)  PCT 12.99 to 9.02  Plan:  Zosyn 3.375 Gm IV Q8h EI  Adjust vanco to 1gm IV q24h   Daily Scr  F/u cultures results and de-escalate accordingly  Check levels as indicated  Height: 5\' 8"  (172.7 cm) Weight: 211 lb 6.7 oz (95.9 kg) IBW/kg (Calculated) : 68.4  Temp (24hrs), Avg:100.2 F (37.9 C), Min:98.7 F (37.1 C), Max:100.8 F (38.2 C)  Recent Labs  Lab 12/13/17 1359 12/13/17 1426 12/13/17 1626 12/13/17 2304 12/14/17 0205 12/14/17 0815 12/14/17 0923 12/14/17 1259 12/15/17 0431 12/16/17 0343  WBC 26.1*  --   --  20.0*  --   --  17.0* 14.7* 14.3* 12.4*  CREATININE 4.24*  --   --  2.90*  --   --  2.64*  --  2.51* 2.05*  LATICACIDVEN  --  5.19* 2.32* 0.9 0.8 1.0  --   --   --   --     Estimated Creatinine Clearance: 39.8 mL/min (A) (by C-G formula based on SCr of 2.05 mg/dL (H)).    Allergies  Allergen Reactions  . Morphine And Related Itching    Antimicrobials this admission: 4/3 zosyn >>  4/3 vancomycin >>  4/3 clindamycin >>  Dose adjustments this admission: 4/5 change vanco to 1gm IV q36h for improved renal fx  Microbiology results: 4/3 BCx at 1400: NGTD 4/3 BCx at 2300:  4/3 wound cx: abundant GPC, rare GPR, re-incubated 4/4 MRSA PCR: neg 4/5 trach asp: abundant yeast, re-incubated Thank you for allowing pharmacy to be a part of this patient's care.  Juliette Alcideustin Zeigler, PharmD, BCPS.   Pager: [phone] 12/16/2017 10:22 AM

## 2017-12-17 ENCOUNTER — Inpatient Hospital Stay (HOSPITAL_COMMUNITY): Payer: Medicare HMO

## 2017-12-17 LAB — BLOOD GAS, ARTERIAL
ACID-BASE EXCESS: 1.5 mmol/L (ref 0.0–2.0)
BICARBONATE: 25.9 mmol/L (ref 20.0–28.0)
Drawn by: 308601
FIO2: 40
LHR: 30 {breaths}/min
O2 SAT: 98.4 %
PCO2 ART: 42.4 mmHg (ref 32.0–48.0)
PEEP: 8 cmH2O
Patient temperature: 37
VT: 480 mL
pH, Arterial: 7.403 (ref 7.350–7.450)
pO2, Arterial: 125 mmHg — ABNORMAL HIGH (ref 83.0–108.0)

## 2017-12-17 LAB — BASIC METABOLIC PANEL
ANION GAP: 7 (ref 5–15)
BUN: 47 mg/dL — AB (ref 6–20)
CO2: 28 mmol/L (ref 22–32)
Calcium: 8.5 mg/dL — ABNORMAL LOW (ref 8.9–10.3)
Chloride: 111 mmol/L (ref 101–111)
Creatinine, Ser: 1.43 mg/dL — ABNORMAL HIGH (ref 0.61–1.24)
GFR, EST AFRICAN AMERICAN: 57 mL/min — AB (ref >60)
GFR, EST NON AFRICAN AMERICAN: 50 mL/min — AB (ref >60)
Glucose, Bld: 133 mg/dL — ABNORMAL HIGH (ref 65–99)
POTASSIUM: 3.1 mmol/L — AB (ref 3.5–5.1)
SODIUM: 146 mmol/L — AB (ref 135–145)

## 2017-12-17 LAB — GLUCOSE, CAPILLARY
GLUCOSE-CAPILLARY: 181 mg/dL — AB (ref 65–99)
GLUCOSE-CAPILLARY: 176 mg/dL — AB (ref 65–99)
Glucose-Capillary: 111 mg/dL — ABNORMAL HIGH (ref 65–99)
Glucose-Capillary: 118 mg/dL — ABNORMAL HIGH (ref 65–99)
Glucose-Capillary: 128 mg/dL — ABNORMAL HIGH (ref 65–99)
Glucose-Capillary: 157 mg/dL — ABNORMAL HIGH (ref 65–99)

## 2017-12-17 LAB — CBC
HCT: 27.8 % — ABNORMAL LOW (ref 39.0–52.0)
Hemoglobin: 9.5 g/dL — ABNORMAL LOW (ref 13.0–17.0)
MCH: 30.4 pg (ref 26.0–34.0)
MCHC: 34.2 g/dL (ref 30.0–36.0)
MCV: 88.8 fL (ref 78.0–100.0)
PLATELETS: 280 10*3/uL (ref 150–400)
RBC: 3.13 MIL/uL — AB (ref 4.22–5.81)
RDW: 15.1 % (ref 11.5–15.5)
WBC: 20.4 10*3/uL — AB (ref 4.0–10.5)

## 2017-12-17 LAB — CULTURE, RESPIRATORY W GRAM STAIN

## 2017-12-17 LAB — CULTURE, RESPIRATORY

## 2017-12-17 LAB — TRIGLYCERIDES: Triglycerides: 289 mg/dL — ABNORMAL HIGH (ref <150)

## 2017-12-17 MED ORDER — FENTANYL 2500MCG IN NS 250ML (10MCG/ML) PREMIX INFUSION
25.0000 ug/h | INTRAVENOUS | Status: DC
  Administered 2017-12-17 (×2): 350 ug/h via INTRAVENOUS
  Administered 2017-12-18: 300 ug/h via INTRAVENOUS
  Administered 2017-12-18: 400 ug/h via INTRAVENOUS
  Administered 2017-12-18: 350 ug/h via INTRAVENOUS
  Administered 2017-12-19: 300 ug/h via INTRAVENOUS
  Filled 2017-12-17 (×7): qty 250

## 2017-12-17 MED ORDER — MIDAZOLAM HCL 2 MG/2ML IJ SOLN
1.0000 mg | INTRAMUSCULAR | Status: DC | PRN
  Administered 2017-12-19: 1 mg via INTRAVENOUS
  Filled 2017-12-17: qty 2

## 2017-12-17 MED ORDER — VANCOMYCIN HCL 10 G IV SOLR
1250.0000 mg | INTRAVENOUS | Status: DC
  Administered 2017-12-17 – 2017-12-20 (×4): 1250 mg via INTRAVENOUS
  Filled 2017-12-17 (×4): qty 1250

## 2017-12-17 MED ORDER — FREE WATER
100.0000 mL | Freq: Four times a day (QID) | Status: DC
  Administered 2017-12-17 – 2017-12-19 (×8): 100 mL

## 2017-12-17 MED ORDER — PROPOFOL 1000 MG/100ML IV EMUL
0.0000 ug/kg/min | INTRAVENOUS | Status: DC
  Administered 2017-12-17: 40 ug/kg/min via INTRAVENOUS
  Administered 2017-12-17: 35 ug/kg/min via INTRAVENOUS
  Administered 2017-12-17: 40 ug/kg/min via INTRAVENOUS
  Administered 2017-12-18 – 2017-12-19 (×5): 30 ug/kg/min via INTRAVENOUS
  Administered 2017-12-19: 40 ug/kg/min via INTRAVENOUS
  Filled 2017-12-17 (×9): qty 100

## 2017-12-17 MED ORDER — FENTANYL BOLUS VIA INFUSION
25.0000 ug | INTRAVENOUS | Status: DC | PRN
  Administered 2017-12-17 – 2017-12-18 (×3): 25 ug via INTRAVENOUS
  Filled 2017-12-17: qty 25

## 2017-12-17 MED ORDER — POTASSIUM CHLORIDE 20 MEQ/15ML (10%) PO SOLN
40.0000 meq | Freq: Once | ORAL | Status: AC
  Administered 2017-12-17: 40 meq
  Filled 2017-12-17: qty 30

## 2017-12-17 MED ORDER — PROPOFOL 1000 MG/100ML IV EMUL
INTRAVENOUS | Status: AC
  Administered 2017-12-17: 40 ug/kg/min via INTRAVENOUS
  Filled 2017-12-17: qty 100

## 2017-12-17 NOTE — Progress Notes (Signed)
eLink Physician-Brief Progress Note Patient Name: Patrick FredericksonCharles W Aguilar DOB: [birth year] MRN: [id number]   Date of Service  12/17/2017  HPI/Events of Note  Agitation - Request to renew soft bilateral wrist restraints.   eICU Interventions  Will renew soft bilateral wrist restraints.      Intervention Category Minor Interventions: Agitation / anxiety - evaluation and management  Adonis Yim Eugene 12/17/2017, 12:58 AM

## 2017-12-17 NOTE — Progress Notes (Signed)
4 Days Post-Op Subjective: Patient remains intubated.  Objective: Vital signs in last 24 hours: Temp:  [99.6 F (37.6 C)-101.4 F (38.6 C)] 100.7 F (38.2 C) (04/07 0800) Pulse Rate:  [69-80] 71 (04/07 0800) Resp:  [26-32] 30 (04/07 0800) BP: (97-157)/(54-73) 102/64 (04/07 0425) SpO2:  [86 %-100 %] 98 % (04/07 0800) FiO2 (%):  [40 %] 40 % (04/07 0800) Weight:  [93.4 kg (205 lb 14.6 oz)] 93.4 kg (205 lb 14.6 oz) (04/07 0406)  Intake/Output from previous day: 04/06 0701 - 04/07 0700 In: 8119.13668.2 [I.V.:2088.2; NG/GT:1080; IV Piggyback:500] Out: 4300 [Urine:4200; Stool:100] Intake/Output this shift: Total I/O In: 789.1 [I.V.:119.1; NG/GT:670] Out: 400 [Urine:400]  Physical Exam:  No acute distress, intubated I took the dressings down and the wound appears stable.  There is no new necrosis.  There is some superficial fibrin developing on the wound.  The erythema and suprapubic and penile edema continued to improve.  Lab Results: Recent Labs    12/15/17 0431 12/16/17 0343 12/17/17 0510  HGB 9.3* 8.8* 9.5*  HCT 26.7* 26.0* 27.8*   BMET Recent Labs    12/16/17 0343 12/17/17 0510  NA 145 146*  K 3.4* 3.1*  CL 110 111  CO2 26 28  GLUCOSE 112* 133*  BUN 56* 47*  CREATININE 2.05* 1.43*  CALCIUM 8.3* 8.5*   No results for input(s): LABPT, INR in the last 72 hours. No results for input(s): LABURIN in the last 72 hours. Results for orders placed or performed during the hospital encounter of 12/13/17  Blood Culture (routine x 2)     Status: None (Preliminary result)   Collection Time: 12/13/17  1:59 PM  Result Value Ref Range Status   Specimen Description BLOOD RIGHT WRIST  Final   Special Requests   Final    BOTTLES DRAWN AEROBIC AND ANAEROBIC Blood Culture adequate volume   Culture   Final    NO GROWTH 4 DAYS Performed at [facility], [address] [place], [place] [address]    Report Status PENDING  Incomplete  Blood Culture (routine x 2)     Status: None  (Preliminary result)   Collection Time: 12/13/17  2:05 PM  Result Value Ref Range Status   Specimen Description BLOOD RIGHT FOREARM  Final   Special Requests   Final    BOTTLES DRAWN AEROBIC AND ANAEROBIC Blood Culture adequate volume   Culture   Final    NO GROWTH 4 DAYS Performed at [facility], [address] [place] [address] [place], [place] [address]    Report Status PENDING  Incomplete  Aerobic/Anaerobic Culture (surgical/deep wound)     Status: None (Preliminary result)   Collection Time: 12/13/17  9:01 PM  Result Value Ref Range Status   Specimen Description   Final    WOUND Performed at [facility], [address] [place], [place] [address]    Special Requests   Final    NONE Performed at [facility], [address] [place], [place] [address]    Gram Stain   Final    NO WBC SEEN ABUNDANT GRAM POSITIVE COCCI RARE GRAM POSITIVE RODS    Culture   Final    CULTURE REINCUBATED FOR BETTER GROWTH HOLDING FOR POSSIBLE ANAEROBE Performed at [facility], [address] [place], [place] [address]    Report Status PENDING  Incomplete  Culture, blood (routine x 2)     Status: None (Preliminary result)   Collection Time: 12/13/17 11:04 PM  Result  Value Ref Range Status   Specimen Description   Final    BLOOD RIGHT HAND Performed at [facility], [address], [place], [place] [address]    Special Requests   Final    BOTTLES DRAWN AEROBIC AND ANAEROBIC Blood Culture adequate volume Performed at [facility], [address] [place], [place] [address]    Culture   Final    NO GROWTH 2 DAYS Performed at [facility], [address], [place], [place] [address]    Report Status PENDING  Incomplete  Culture, blood (routine x 2)     Status: None (Preliminary result)   Collection Time: 12/13/17 11:04 PM  Result Value Ref Range Status   Specimen Description   Final    BLOOD LEFT  ANTECUBITAL Performed at [facility], [address] [place], [place] [address]    Special Requests   Final    BOTTLES DRAWN AEROBIC AND ANAEROBIC Blood Culture adequate volume Performed at [facility], [address], [place], [place] [address]    Culture   Final    NO GROWTH 2 DAYS Performed at [facility], [address] [place], [place] [address]    Report Status PENDING  Incomplete  MRSA PCR Screening     Status: None   Collection Time: 12/14/17  1:53 AM  Result Value Ref Range Status   MRSA by PCR NEGATIVE NEGATIVE Final    Comment:        The GeneXpert MRSA Assay (FDA approved for NASAL specimens only), is one component of a comprehensive MRSA colonization surveillance program. It is not intended to diagnose MRSA infection nor to guide or monitor treatment for MRSA infections. Performed at [facility], [address], [place], [place] [address]   Culture, respiratory (NON-Expectorated)     Status: None (Preliminary result)   Collection Time: 12/15/17  3:03 AM  Result Value Ref Range Status   Specimen Description   Final    TRACHEAL ASPIRATE Performed at [facility], [address] [place], [place] [address]    Special Requests   Final    NONE Performed at [facility], [address] [place], [place] [address]    Gram Stain   Final    ABUNDANT WBC PRESENT, PREDOMINANTLY PMN ABUNDANT YEAST    Culture   Final    CULTURE REINCUBATED FOR BETTER GROWTH Performed at [facility], [address] [place], [place] [address]    Report Status PENDING  Incomplete    Studies/Results: Dg Chest Port 1 View  Result Date: 12/17/2017 CLINICAL DATA:  Respiratory failure EXAM: PORTABLE CHEST 1 VIEW COMPARISON:  December 16, 2017 FINDINGS: The ETT and right IJ are in good position. No pneumothorax. Bibasilar infiltrates persist. No other changes. IMPRESSION: Stable  support apparatus. Stable bibasilar infiltrates. Possible mild superimposed pulmonary venous congestion. Electronically Signed   By: Gerome Sam III M.D   On: 12/17/2017 08:22   Dg Chest Port 1 View  Result Date: 12/16/2017 CLINICAL DATA:  Respiratory failure EXAM: PORTABLE CHEST 1 VIEW COMPARISON:  12/15/2017 FINDINGS: Endotracheal tube in good position. Right jugular central venous catheter tip in the lower SVC unchanged. No pneumothorax. NG tube enters the stomach. Diffuse bilateral airspace disease left greater than right shows mild interval improvement. Progression of bibasilar atelectasis.  No effusion IMPRESSION: Support lines remain in good position Diffuse bilateral airspace disease with interval improvement  Progression of bibasilar atelectasis. Electronically Signed   By: Marlan Palau M.D.   On: 12/16/2017 07:27    Assessment/Plan:  Fournier's gangrene status post debridement December 14, 2017 --continue wound care.  Will consult wound care for some recommendations on cleaning up some of the superficial slough.  Will follow.    LOS: 4 days   Jerilee Field 12/17/2017, 10:53 AM

## 2017-12-17 NOTE — Progress Notes (Signed)
eLink Physician-Brief Progress Note Patient Name: Patrick FredericksonCharles W Geerts DOB: 14-Aug-1951 MRN: [id number]   Date of Service  12/17/2017  HPI/Events of Note  3+ watery bowel movements in last 24 hours - Request for Flexiseal.   eICU Interventions  Will order: 1. D/C Colace.  2. Place Flexiseal.      Intervention Category Major Interventions: Other:  Sommer,Steven Dennard Nipugene 12/17/2017, 2:11 AM

## 2017-12-17 NOTE — Progress Notes (Signed)
Pharmacy Antibiotic Note  Patrick Aguilar is a 67 y.o. male admitted on 12/13/2017 with Fournier's gangrene.  Pharmacy has been consulted for zosyn and vancomycin dosing.  Also on Clindamycin IV for toxin production.  Today, 12/17/2017 Day #4 (full days)  Renal:  improvement in SCr. Good UOP  WBC increased  Off pressors  Continues to have low-grade fevers  Await final cx results (GPR, GPC) - holding for possible anaerobe  PCT 12.99 to 9.02  Plan:  Zosyn 3.375 Gm IV Q8h EI  Adjust vanco to 1.25gm IV q24h   Watery stools reported, Consider checking C. Difficile if concerned for C. Difficile (if WBC continues to increase would raise suspicion in setting of continued watery diarrhea), patient on clindamycin.    Daily Scr  F/u cultures results and de-escalate accordingly  Check levels as indicated  Height: 5\' 8"  (172.7 cm) Weight: 205 lb 14.6 oz (93.4 kg) IBW/kg (Calculated) : 68.4  Temp (24hrs), Avg:100.6 F (38.1 C), Min:99.6 F (37.6 C), Max:101.4 F (38.6 C)  Recent Labs  Lab 12/13/17 1426 12/13/17 1626 12/13/17 2304 12/14/17 0205 12/14/17 0815 12/14/17 0923 12/14/17 1259 12/15/17 0431 12/16/17 0343 12/17/17 0510  WBC  --   --  20.0*  --   --  17.0* 14.7* 14.3* 12.4* 20.4*  CREATININE  --   --  2.90*  --   --  2.64*  --  2.51* 2.05* 1.43*  LATICACIDVEN 5.19* 2.32* 0.9 0.8 1.0  --   --   --   --   --     Estimated Creatinine Clearance: 56.3 mL/min (A) (by C-G formula based on SCr of 1.43 mg/dL (H)).    Allergies  Allergen Reactions  . Morphine And Related Itching    Antimicrobials this admission: 4/3 zosyn >>  4/3 vancomycin >>  4/3 clindamycin >>  Dose adjustments this admission: 4/5 change vanco to 1gm IV q36h for improved renal fx  Microbiology results: 4/3 BCx at 1400: NGTD 4/3 BCx at 2300:  4/3 wound cx: abundant GPC, rare GPR, re-incubated 4/4 MRSA PCR: neg 4/5 trach asp: abundant yeast, re-incubated  Thank you for allowing pharmacy to  be a part of this patient's care.  Juliette Alcideustin Isam Unrein, PharmD, BCPS.   Pager: [phone] 12/17/2017 8:52 AM

## 2017-12-17 NOTE — Progress Notes (Signed)
PULMONARY / CRITICAL CARE MEDICINE   Name: Patrick Aguilar MRN:   [id number] DOB:   04-30-1951           ADMISSION DATE:  12/13/2017 CONSULTATION DATE: 12/13/2017  REFERRING MD: Dr Retta Diones (Urology)  CHIEF COMPLAINT: Fournier's gangrene, Septic shock, VDRF, ARDS  HISTORY OF PRESENT ILLNESS:   67 yo male transferred from [facility] with septic shock from Fournier's gangrene complicated by ARDS, AKI.  PMHx of COPD, GERD, HTN, Anxiety.  SUBJECTIVE: Tolerating pressure support.  Issues with sedation.  VITAL SIGNS: BP 102/64   Pulse 71   Temp (!) 100.7 F (38.2 C) (Axillary)   Resp (!) 30   Ht 5\' 8"  (1.727 m)   Wt 205 lb 14.6 oz (93.4 kg)   SpO2 98%   BMI 31.31 kg/m    VENT SETTINGS: Vent Mode: PRVC FiO2 (%):  [40 %] 40 % Set Rate:  [30 bmp] 30 bmp Vt Set:  [480 mL] 480 mL PEEP:  [8 cmH20] 8 cmH20 Plateau Pressure:  [16 cmH20-26 cmH20] 18 cmH20  INTAKE/OUTPUT: I/O last 3 completed shifts: In: 5084.3 [I.V.:3404.3; NG/GT:1080; IV Piggyback:600] Out: 5600 [Urine:5500; Stool:100]  PHYSICAL EXAM:  General - sedated Eyes - pupils reactive ENT - ETT in place Cardiac - regular, no murmur Chest - scattered rhonchi Abd - soft, non tender GU - scrotum in sling Ext - no edema Skin - no rashes Neuro - follows simple commands   CBC Recent Labs    12/15/17 0431 12/16/17 0343 12/17/17 0510  WBC 14.3* 12.4* 20.4*  HGB 9.3* 8.8* 9.5*  HCT 26.7* 26.0* 27.8*  PLT 224 244 280    Coag's No results for input(s): APTT, INR in the last 72 hours.  BMET Recent Labs    12/15/17 0431 12/16/17 0343 12/17/17 0510  NA 138 145 146*  K 4.2 3.4* 3.1*  CL 105 110 111  CO2 22 26 28   BUN 57* 56* 47*  CREATININE 2.51* 2.05* 1.43*  GLUCOSE 168* 112* 133*    Electrolytes Recent Labs    12/15/17 0431 12/15/17 1656 12/16/17 0343 12/17/17 0510  CALCIUM 7.8*  --  8.3* 8.5*  MG 2.1 2.2  --   --   PHOS 6.0* 5.2*  --   --     Sepsis Markers Recent Labs    12/15/17 0431   PROCALCITON 9.02    ABG Recent Labs    12/15/17 1445 12/16/17 0328 12/17/17 0415  PHART 7.316* 7.358 7.403  PCO2ART 42.9 41.9 42.4  PO2ART 101 99.2 125*    Liver Enzymes Recent Labs    12/15/17 0431 12/16/17 0343  AST 27 29  ALT 38 37  ALKPHOS 170* 216*  BILITOT 0.6 0.8  ALBUMIN 2.0* 2.0*    Cardiac Enzymes No results for input(s): TROPONINI, PROBNP in the last 72 hours.  Glucose Recent Labs    12/16/17 1137 12/16/17 1520 12/16/17 2018 12/17/17 0039 12/17/17 0403 12/17/17 0800  GLUCAP 144* 165* 121* 128* 111* 118*    Imaging Dg Chest Port 1 View  Result Date: 12/17/2017 CLINICAL DATA:  Respiratory failure EXAM: PORTABLE CHEST 1 VIEW COMPARISON:  December 16, 2017 FINDINGS: The ETT and right IJ are in good position. No pneumothorax. Bibasilar infiltrates persist. No other changes. IMPRESSION: Stable support apparatus. Stable bibasilar infiltrates. Possible mild superimposed pulmonary venous congestion. Electronically Signed   By: Gerome Sam III M.D   On: 12/17/2017 08:22   Dg Chest Port 1 View  Result Date: 12/16/2017 CLINICAL DATA:  Respiratory  failure EXAM: PORTABLE CHEST 1 VIEW COMPARISON:  12/15/2017 FINDINGS: Endotracheal tube in good position. Right jugular central venous catheter tip in the lower SVC unchanged. No pneumothorax. NG tube enters the stomach. Diffuse bilateral airspace disease left greater than right shows mild interval improvement. Progression of bibasilar atelectasis.  No effusion IMPRESSION: Support lines remain in good position Diffuse bilateral airspace disease with interval improvement Progression of bibasilar atelectasis. Electronically Signed   By: Marlan Palauharles  Clark M.D.   On: 12/16/2017 07:27    CULTURES: Blood 4/03 >> Scrotum 4/03 >>  Sputum 4/05 >>   ANTIBIOTICS: Vancomycin 4/3 >> Zosyn 4/3 >> Clindamycin 4/3 >>  SIGNIFICANT EVENTS: [facility]brid[facility] of scrotum 4/04 add giapreza 4/05 off  giapreza 4/06 off pressors  LINES/TUBES: ETT 4/3 >> Rt IJ CVL 4/04 >>  Lt radial aline 4/04 >> 4/07  DISCUSSION: Hemodynamics, oxygenation, renal fx continue to improve.  Starting pressure support weaning trial.  Not ready for extubation, but getting closer.  ASSESSMENT/PLAN:  Acute hypoxic respiratory failure with ARDS. Hx of COPD. - pressure support wean as able - f/u CXR - prn BDs  Septic shock from Fournier's gangrene. - wound care per urology - day 5 of ABx  Acute renal failure with ATN. Lactic acidosis. Hypokalemia. - monitor renal fx, urine output - replace electrolytes as needed  Diarrhea. - laxatives d/c'ed  Anemia of critical illness. - f/u CBC  Hx of GERD. - zantac  DM type II. - SSI  DVT prophylaxis - SQ heparin SUP - zantac Nutrition - tube feeds Goals of care - full code  CC time 31 minutes  Coralyn HellingVineet Myranda Pavone, MD [facility] 12/17/2017, 10:31 AM

## 2017-12-18 ENCOUNTER — Inpatient Hospital Stay (HOSPITAL_COMMUNITY): Payer: Medicare HMO

## 2017-12-18 ENCOUNTER — Encounter (HOSPITAL_COMMUNITY): Payer: Self-pay | Admitting: Urology

## 2017-12-18 LAB — BASIC METABOLIC PANEL
Anion gap: 12 (ref 5–15)
BUN: 41 mg/dL — AB (ref 6–20)
CALCIUM: 8.2 mg/dL — AB (ref 8.9–10.3)
CO2: 26 mmol/L (ref 22–32)
Chloride: 110 mmol/L (ref 101–111)
Creatinine, Ser: 1.28 mg/dL — ABNORMAL HIGH (ref 0.61–1.24)
GFR calc non Af Amer: 57 mL/min — ABNORMAL LOW (ref >60)
Glucose, Bld: 251 mg/dL — ABNORMAL HIGH (ref 65–99)
Potassium: 3.2 mmol/L — ABNORMAL LOW (ref 3.5–5.1)
SODIUM: 148 mmol/L — AB (ref 135–145)

## 2017-12-18 LAB — CBC
HCT: 27.6 % — ABNORMAL LOW (ref 39.0–52.0)
Hemoglobin: 9.1 g/dL — ABNORMAL LOW (ref 13.0–17.0)
MCH: 29.9 pg (ref 26.0–34.0)
MCHC: 33 g/dL (ref 30.0–36.0)
MCV: 90.8 fL (ref 78.0–100.0)
PLATELETS: 293 10*3/uL (ref 150–400)
RBC: 3.04 MIL/uL — ABNORMAL LOW (ref 4.22–5.81)
RDW: 15.6 % — AB (ref 11.5–15.5)
WBC: 19.4 10*3/uL — AB (ref 4.0–10.5)

## 2017-12-18 LAB — GLUCOSE, CAPILLARY
GLUCOSE-CAPILLARY: 100 mg/dL — AB (ref 65–99)
GLUCOSE-CAPILLARY: 138 mg/dL — AB (ref 65–99)
GLUCOSE-CAPILLARY: 214 mg/dL — AB (ref 65–99)
Glucose-Capillary: 218 mg/dL — ABNORMAL HIGH (ref 65–99)
Glucose-Capillary: 185 mg/dL — ABNORMAL HIGH (ref 65–99)
Glucose-Capillary: 205 mg/dL — ABNORMAL HIGH (ref 65–99)
Glucose-Capillary: 191 mg/dL — ABNORMAL HIGH (ref 65–99)

## 2017-12-18 LAB — CULTURE, BLOOD (ROUTINE X 2)
CULTURE: NO GROWTH
Culture: NO GROWTH
SPECIAL REQUESTS: ADEQUATE
Special Requests: ADEQUATE

## 2017-12-18 LAB — MAGNESIUM: MAGNESIUM: 1.9 mg/dL (ref 1.7–2.4)

## 2017-12-18 MED ORDER — PRO-STAT SUGAR FREE PO LIQD
60.0000 mL | Freq: Three times a day (TID) | ORAL | Status: DC
  Administered 2017-12-18 (×2): 60 mL
  Filled 2017-12-18 (×2): qty 60

## 2017-12-18 MED ORDER — COLLAGENASE 250 UNIT/GM EX OINT
1.0000 "application " | TOPICAL_OINTMENT | Freq: Two times a day (BID) | CUTANEOUS | Status: DC
  Administered 2017-12-18 – 2017-12-27 (×13): 1 via TOPICAL
  Filled 2017-12-18: qty 30
  Filled 2017-12-18: qty 90
  Filled 2017-12-18: qty 30
  Filled 2017-12-18: qty 90

## 2017-12-18 MED ORDER — POTASSIUM CHLORIDE 20 MEQ/15ML (10%) PO SOLN
40.0000 meq | Freq: Two times a day (BID) | ORAL | Status: AC
  Administered 2017-12-18 – 2017-12-19 (×4): 40 meq
  Filled 2017-12-18 (×4): qty 30

## 2017-12-18 MED ORDER — FUROSEMIDE 10 MG/ML IJ SOLN
40.0000 mg | Freq: Four times a day (QID) | INTRAMUSCULAR | Status: AC
  Administered 2017-12-18 (×2): 40 mg via INTRAVENOUS
  Filled 2017-12-18 (×2): qty 4

## 2017-12-18 MED ORDER — POTASSIUM CHLORIDE 10 MEQ/50ML IV SOLN
10.0000 meq | INTRAVENOUS | Status: AC
  Administered 2017-12-18 (×4): 10 meq via INTRAVENOUS
  Filled 2017-12-18 (×4): qty 50

## 2017-12-18 MED ORDER — VITAL HIGH PROTEIN PO LIQD
1000.0000 mL | ORAL | Status: DC
  Administered 2017-12-18: 1000 mL
  Filled 2017-12-18 (×2): qty 1000

## 2017-12-18 NOTE — Progress Notes (Signed)
eLink Physician-Brief Progress Note Patient Name: Patrick FredericksonCharles W Aguilar DOB: 06-02-1951 MRN: [id number]   Date of Service  12/18/2017  HPI/Events of Note  K+ = 3.2 and Creatinine = 1.28.  eICU Interventions  Will replace K+.      Intervention Category Major Interventions: Electrolyte abnormality - evaluation and management  [name] 12/18/2017, 7:10 AM

## 2017-12-18 NOTE — Progress Notes (Signed)
PULMONARY / CRITICAL CARE MEDICINE   Name: Patrick Aguilar MRN: [id number] DOB: 02/26/1951    ADMISSION DATE:  12/13/2017 CONSULTATION DATE:  12/13/2017  REFERRING MD:  Normajean Baxter (Urology)  CHIEF COMPLAINT:  Fournier's Gangrene, Septic Shock, ARDS  HISTORY OF PRESENT ILLNESS:   67 y/o male with septic shock from fournier's gangrene complicated by ARDS, AKI.  He has COPD, GERD, HTN, anxiety.    SUBJECTIVE:  Had dressing change this morning, now very sedated on exam  VITAL SIGNS: BP (!) 125/51   Pulse 80   Temp 99.7 F (37.6 C) (Axillary)   Resp 19   Ht 5\' 8"  (1.727 m)   Wt 201 lb 8 oz (91.4 kg)   SpO2 97%   BMI 30.64 kg/m   HEMODYNAMICS:    VENTILATOR SETTINGS: Vent Mode: PRVC FiO2 (%):  [40 %] 40 % Set Rate:  [18 bmp] 18 bmp Vt Set:  [480 mL] 480 mL PEEP:  [5 cmH20] 5 cmH20 Plateau Pressure:  [12 cmH20-16 cmH20] 13 cmH20  INTAKE / OUTPUT: I/O last 3 completed shifts: In: 5527.2 [I.V.:3341.9; Other:50; NF/AO:1308.6; IV Piggyback:600] Out: 5784 [ONGEX:5284; Stool:1001]  PHYSICAL EXAMINATION:  General:  In bed on vent HENT: NCAT ETT in place PULM: CTA B, vent supported breathing CV: RRR, no mgr GI: BS+, soft, nontender MSK: normal bulk and tone Neuro: sedated on vent    LABS:  BMET Recent Labs  Lab 12/16/17 0343 12/17/17 0510 12/18/17 0520  NA 145 146* 148*  K 3.4* 3.1* 3.2*  CL 110 111 110  CO2 26 28 26   BUN 56* 47* 41*  CREATININE 2.05* 1.43* 1.28*  GLUCOSE 112* 133* 251*    Electrolytes Recent Labs  Lab 12/14/17 0923 12/15/17 0431 12/15/17 1656 12/16/17 0343 12/17/17 0510 12/18/17 0520  CALCIUM 7.4* 7.8*  --  8.3* 8.5* 8.2*  MG 2.1 2.1 2.2  --   --  1.9  PHOS 5.0* 6.0* 5.2*  --   --   --     CBC Recent Labs  Lab 12/16/17 0343 12/17/17 0510 12/18/17 0520  WBC 12.4* 20.4* 19.4*  HGB 8.8* 9.5* 9.1*  HCT 26.0* 27.8* 27.6*  PLT 244 280 293    Coag's Recent Labs  Lab 12/13/17 2304  INR 1.39    Sepsis Markers Recent  Labs  Lab 12/13/17 2304 12/14/17 0205 12/14/17 0815 12/14/17 0923 12/15/17 0431  LATICACIDVEN 0.9 0.8 1.0  --   --   PROCALCITON 12.99  --   --  12.03 9.02    ABG Recent Labs  Lab 12/15/17 1445 12/16/17 0328 12/17/17 0415  PHART 7.316* 7.358 7.403  PCO2ART 42.9 41.9 42.4  PO2ART 101 99.2 125*    Liver Enzymes Recent Labs  Lab 12/13/17 1359 12/15/17 0431 12/16/17 0343  AST 47* 27 29  ALT 55 38 37  ALKPHOS 183* 170* 216*  BILITOT 1.0 0.6 0.8  ALBUMIN 2.5* 2.0* 2.0*    Cardiac Enzymes Recent Labs  Lab 12/13/17 1359  TROPONINI <0.03    Glucose Recent Labs  Lab 12/17/17 1204 12/17/17 1527 12/17/17 1954 12/17/17 2357 12/18/17 0412 12/18/17 0752  GLUCAP 181* 176* 157* 100* 214* 218*    Imaging Dg Chest Port 1 View  Result Date: 12/18/2017 CLINICAL DATA:  Respiratory failure EXAM: PORTABLE CHEST 1 VIEW COMPARISON:  12/17/2017 FINDINGS: Support tubes and lines are stable. The film is slightly rotated, but there appears to be increasing RIGHT lung opacities, likely pneumonia. No significant improvement on the LEFT. Stable cardiomediastinal silhouette. No  pneumothorax. IMPRESSION: Worsening aeration. Electronically Signed   By: Elsie StainJohn T Curnes M.D.   On: 12/18/2017 07:06     CULTURES: Blood 4/03 >> Scrotum 4/03 >>  Sputum 4/05 >>   ANTIBIOTICS: Vancomycin 4/3 >> Zosyn 4/3 >> Clindamycin 4/3 >>  SIGNIFICANT EVENTS: [facility]bridement of scrotum 4/04 add giapreza 4/05 off giapreza 4/06 off pressors  LINES/TUBES: ETT 4/3 >> Rt IJ CVL 4/04 >>  Lt radial aline 4/04 >> 4/07   DISCUSSION: 67 y/o male who had septic shock from Fournier's Gangrene has done well since surgical debridement, has improving respiratory status.    ASSESSMENT / PLAN:  PULMONARY A: ARDS, improved vent mechanics and oxygenation Acute pulmonary edema P:   Full mechanical vent support VAP prevention Daily WUA/SBT Diurese  today  CARDIOVASCULAR A:  Septic shock: resolved P:  Tele Monitor hemodynamics  RENAL A:   Anasarca Hypokalemia P:   Monitor BMET and UOP Replace electrolytes as needed Diuresis today Replace K  GASTROINTESTINAL A:   No acute issues P:   Tube feeding continue zantac for stress ulcer prophylaxis  HEMATOLOGIC A:   Anemia of critical illness P:  Monitor for bleeding  INFECTIOUS A:   Fournier's gangrene P:   Would prefer to continue current antibiotic coverage today to see if 4/3 cultures will grow out anything  ENDOCRINE A:   Hyperglycemia P:   SSI  NEUROLOGIC A:   Anxiety Need for sedation P:   RASS goal: 0 Wean sedation, daily WUA Continue fentanyl/propofol per PAD protocol   FAMILY  - Updates: none bedside  - Inter-disciplinary family meet or Palliative Care meeting due by:  day 7  My cc time 35 minutes  Heber CarolinaBrent McQuaid, MD [place] [facility] Pager: [phone] Cell: [phone] After 3pm or if no response, call [phone]    12/18/2017, 9:07 AM

## 2017-12-18 NOTE — Progress Notes (Signed)
5 Days Post-Op Subjective: Patient stable--off of pressors  Objective: Vital signs in last 24 hours: Temp:  [99.2 F (37.3 C)-100.5 F (38.1 C)] 99.7 F (37.6 C) (04/08 0800) Pulse Rate:  [67-91] 80 (04/08 0800) Resp:  [12-24] 19 (04/08 0800) BP: (104-167)/(39-65) 125/51 (04/08 0800) SpO2:  [90 %-97 %] 97 % (04/08 0804) FiO2 (%):  [40 %] 40 % (04/08 0804) Weight:  [91.4 kg (201 lb 8 oz)] 91.4 kg (201 lb 8 oz) (04/08 0500)  Intake/Output from previous day: 04/07 0701 - 04/08 0700 In: 3436.2 [I.V.:1350.9; NG/GT:1535.3; IV Piggyback:500] Out: 4426 [Urine:3525; Stool:901] Intake/Output this shift: No intake/output data recorded.  Physical Exam:  Constitutional: Vital signs reviewed.Sedated Perineal wound examined. Edges of wound clean/no induration. SP area now w/o erythema. Base of perineal wound clean. Fibrinous material on testicles persistent. No purulence noted. No devitalized tissue present.  Lab Results: Recent Labs    12/16/17 0343 12/17/17 0510 12/18/17 0520  HGB 8.8* 9.5* 9.1*  HCT 26.0* 27.8* 27.6*   BMET Recent Labs    12/17/17 0510 12/18/17 0520  NA 146* 148*  K 3.1* 3.2*  CL 111 110  CO2 28 26  GLUCOSE 133* 251*  BUN 47* 41*  CREATININE 1.43* 1.28*  CALCIUM 8.5* 8.2*   No results for input(s): LABPT, INR in the last 72 hours. No results for input(s): LABURIN in the last 72 hours. Results for orders placed or performed during the hospital encounter of 12/13/17  Blood Culture (routine x 2)     Status: None   Collection Time: 12/13/17  1:59 PM  Result Value Ref Range Status   Specimen Description BLOOD RIGHT WRIST  Final   Special Requests   Final    BOTTLES DRAWN AEROBIC AND ANAEROBIC Blood Culture adequate volume   Culture   Final    NO GROWTH 5 DAYS Performed at [facility], [address] [place], [place] [address]    Report Status 12/18/2017 FINAL  Final  Blood Culture (routine x 2)     Status: None   Collection Time: 12/13/17  2:05 PM   Result Value Ref Range Status   Specimen Description BLOOD RIGHT FOREARM  Final   Special Requests   Final    BOTTLES DRAWN AEROBIC AND ANAEROBIC Blood Culture adequate volume   Culture   Final    NO GROWTH 5 DAYS Performed at [facility], [address], [place], [place] [address]    Report Status 12/18/2017 FINAL  Final  Aerobic/Anaerobic Culture (surgical/deep wound)     Status: None (Preliminary result)   Collection Time: 12/13/17  9:01 PM  Result Value Ref Range Status   Specimen Description   Final    WOUND Performed at [facility], [address], [place], [place] [address]    Special Requests   Final    NONE Performed at [facility], [address] [place], [place] [address]    Gram Stain   Final    NO WBC SEEN ABUNDANT GRAM POSITIVE COCCI RARE GRAM POSITIVE RODS    Culture   Final    CULTURE REINCUBATED FOR BETTER GROWTH HOLDING FOR POSSIBLE ANAEROBE Performed at [facility], [address], [place], [place] [address]    Report Status PENDING  Incomplete  Culture, blood (routine x 2)     Status: None (Preliminary result)   Collection Time: 12/13/17 11:04 PM  Result Value Ref Range Status   Specimen Description   Final  BLOOD RIGHT HAND Performed at [facility], [address] [place], [place] [address]    Special Requests   Final    BOTTLES DRAWN AEROBIC AND ANAEROBIC Blood Culture adequate volume Performed at [facility], [address] [place], [place] [address]    Culture   Final    NO GROWTH 3 DAYS Performed at [facility], [address] [place], [place] [address]    Report Status PENDING  Incomplete  Culture, blood (routine x 2)     Status: None (Preliminary result)   Collection Time: 12/13/17 11:04 PM  Result Value Ref Range Status   Specimen Description   Final    BLOOD LEFT ANTECUBITAL Performed at [facility], [address] [place], [place] [address]    Special Requests   Final    BOTTLES DRAWN AEROBIC AND ANAEROBIC Blood Culture adequate volume Performed at [facility], [address] [place], [place] [address]    Culture   Final    NO GROWTH 3 DAYS Performed at [facility], [address] [place], [place] [address]    Report Status PENDING  Incomplete  MRSA PCR Screening     Status: None   Collection Time: 12/14/17  1:53 AM  Result Value Ref Range Status   MRSA by PCR NEGATIVE NEGATIVE Final    Comment:        The GeneXpert MRSA Assay (FDA approved for NASAL specimens only), is one component of a comprehensive MRSA colonization surveillance program. It is not intended to diagnose MRSA infection nor to guide or monitor treatment for MRSA infections. Performed at [facility], [address] [place], [place] [address]   Culture, respiratory (NON-Expectorated)     Status: None   Collection Time: 12/15/17  3:03 AM  Result Value Ref Range Status   Specimen Description   Final    TRACHEAL ASPIRATE Performed at [facility], [address] [place], [place] [address]    Special Requests   Final    NONE Performed at [facility], [address] [place], [place] [address]    Gram Stain   Final    ABUNDANT WBC PRESENT, PREDOMINANTLY PMN ABUNDANT YEAST Performed at [facility], [address] [place], [place] [address]    Culture ABUNDANT CANDIDA ALBICANS  Final   Report Status 12/17/2017 FINAL  Final    Studies/Results: Dg Chest Port 1 View  Result Date: 12/18/2017 CLINICAL DATA:  Respiratory failure EXAM: PORTABLE CHEST 1 VIEW COMPARISON:  12/17/2017 FINDINGS: Support tubes and lines are stable. The film is slightly rotated, but there appears to be increasing RIGHT lung opacities, likely pneumonia. No significant improvement on the LEFT. Stable cardiomediastinal silhouette. No pneumothorax.  IMPRESSION: Worsening aeration. Electronically Signed   By: Elsie StainJohn T Curnes M.D.   On: 12/18/2017 07:06   Dg Chest Port 1 View  Result Date: 12/17/2017 CLINICAL DATA:  Respiratory failure EXAM: PORTABLE CHEST 1 VIEW COMPARISON:  December 16, 2017 FINDINGS: The ETT and right IJ are in good position. No pneumothorax. Bibasilar infiltrates persist. No other changes. IMPRESSION: Stable support apparatus. Stable bibasilar infiltrates. Possible mild superimposed pulmonary venous congestion. Electronically Signed   By: Gerome Samavid  Williams III M.D   On: 12/17/2017 08:22    Assessment/Plan:   Fournier's gangrene, s/p excision/debridement 4/3. Cultures + for gm+ cocci. From wound standpoint he is doing well--no progression of process, wound looks  appropriate.  Will ask for wound consult to clean up some fibrinous material. Will also ask Plastics to see regarding wound closure.   LOS: 5 days   Chelsea Aus 12/18/2017, 9:08 AM

## 2017-12-18 NOTE — Consult Note (Signed)
WOC Nurse wound consult note Reason for Consult:Fourneier's gangrene to testicular and suprapubic areas with debridement and subsequent exposure of tunica vaginalis bilaterally. Wound type: gangrene with surgical debridement. Premedicated with bolus analgesia via bedside RN prior to assessment.  Pressure Injury POA: NA Measurement: SP area if 4.4 cm 6.3 cm x 0.1 cm with 50% adherent fibrin slough present.  No purulence or erythema noted.  Penis is edematous.  Bilateral testicular skin debrided with presence of fibrin slough noted.  Wound bed:50% adherent fibrin slough.  Will begin enzymatic debridement twice daily with NS moist kerlix to promote healing.  Drainage (amount, consistency, odor) minimal serosanguinous  No odor.  Periwound: intact  Fecal management system in place due to large loose stools.  Dressing procedure/placement/frequency: Cleanse SP area and testicles with Ns and pat dry.  Apply Santyl enzymatic debrider to noted slough in wound bed.  Cover with NS moist woven gauze twice daily. Secure with ABD pad and mesh briefs.  Will not follow at this time.  Please re-consult if needed.  Maple Hudson RN BSN CWON Pager [phone]

## 2017-12-18 NOTE — Progress Notes (Signed)
Inpatient Diabetes Program Recommendations  AACE/ADA: New Consensus Statement on Inpatient Glycemic Control (2015)  Target Ranges:  Prepandial:   less than 140 mg/dL      Peak postprandial:   less than 180 mg/dL (1-2 hours)      Critically ill patients:  140 - 180 mg/dL   Lab Results  Component Value Date   GLUCAP 218 (H) 12/18/2017   HGBA1C 7.4 (H) 12/13/2017    Review of Glycemic Control  Blood sugars this am - 214, 251, 218. On Vital HP TF. Needs tighter glycemic control for healing.   Inpatient Diabetes Program Recommendations:     Start ICU Glycemic Control protocol   Or Increase Novolog to 6 units tidwc Add Lantus 15 units QHS  Will continue to follow.  Thank you. Ailene Ardshonda Mannat Benedetti, RD, LDN, CDE Inpatient Diabetes Coordinator [phone]

## 2017-12-18 NOTE — Progress Notes (Signed)
Nutrition Follow-up  DOCUMENTATION CODES:   Obesity unspecified  INTERVENTION:  - Will adjust TF regimen: Vital High Protein @ 20 mL/hr with 60 mL Prostat TID. This regimen + kcal from current Propofol rate will provide 1378 kcal (105% estimated kcal need), 132 grams of protein (94% estimated protein need), and 401 mL free water.  - Will continue to monitor for changes in Propofol rate. - Free water flush to continue to be per CCM.    NUTRITION DIAGNOSIS:   Inadequate oral intake related to inability to eat as evidenced by NPO status. -ongoing  GOAL:   Provide needs based on ASPEN/SCCM guidelines -met with TF regimen.   MONITOR:   Vent status, TF tolerance, Weight trends, Labs, Skin  ASSESSMENT:   67 yo male with hx of COPD, HTN, GERD, OA, and anxiety, He [facility] to [facility] ER on 4/3 AM c/o SOB x 2 weeks and scrotal pain. He was found to have acute hypoxic respiratory failure and fournier's gangrene. He was transferred to [facility] where he underwent operative debridement. Postop he returned to the ICU, intubated on max vent settings with acute hypoxic respiratory failure and ARDS and in shock on vasopressors.   Pt remains intubated, sedated, with OGT in place. He is currently receiving Vital High Protein @ 40 mL/hr with 60 mL Prostat BID and 100 mL free water QID. This regimen is providing 1360 kcal (103% estimated kcal need), 144 grams of protein, and 1202 mL free water.   Per Dr. Anastasia Pall note this AM: ARDS protocol, plan for daily QUA/SBT, septic shock resolved, anasarca and hypokalemia.   Patient is currently intubated on ventilator support MV: 9.7 L/min Temp (24hrs), Avg:99.7 F (37.6 C), Min:99.2 F (37.3 C), Max:100.5 F (38.1 C) Propofol: 11.3 ml/hr (298 kcal) BP: 158/64 and MAP: 96  Medications reviewed; 40 mg IV Lasix QID, sliding scale Novolog, 4 unit Novolog every 4 hours, 15 mL liquid multivitamin per OGT/day, 40 mEq KCl per OGT x2 doses today,  150 mg Zantac per OGT BID.   Labs reviewed; CBGs: 214 and 218 mg/dL today, Na: 148 mmol/L, K: 3.2 mmol/L, BUN: 41 mg/dL, creatinine: 1.28 mg/dL, Ca: 8.2 mg/dL, GFR: 57 mL/min.   Drips: Propofol @ 20 mcg/kg/min, Fentanyl @ 400 mcg/hr.    Diet Order:  Diet NPO time specified  EDUCATION NEEDS:   No education needs have been identified at this time  Skin:  Skin Assessment: Skin Integrity Issues: Skin Integrity Issues:: Incisions Incisions: perineum from debridement on 4/3  Last BM:  4/8  Height:   Ht Readings from Last 1 Encounters:  12/13/17 _0  (1.727 m)    Weight:   Wt Readings from Last 1 Encounters:  12/18/17 201 lb 8 oz (91.4 kg)    Ideal Body Weight:  70 kg  BMI:  Body mass index is 30.64 kg/m.  Estimated Nutritional Needs:   Kcal:  1700-1749 (11-14 kcal/kg)  Protein:  >/= 140 grams (2 grams/kg IBW)  Fluid:  >/= 1.8 L/day      Jarome Matin, MS, RD, LDN, [facility] Inpatient Clinical Dietitian Pager # [phone] After hours/weekend pager # [phone]

## 2017-12-19 ENCOUNTER — Inpatient Hospital Stay (HOSPITAL_COMMUNITY): Payer: Medicare HMO

## 2017-12-19 LAB — GLUCOSE, CAPILLARY
GLUCOSE-CAPILLARY: 147 mg/dL — AB (ref 65–99)
GLUCOSE-CAPILLARY: 180 mg/dL — AB (ref 65–99)
GLUCOSE-CAPILLARY: 279 mg/dL — AB (ref 65–99)
Glucose-Capillary: 199 mg/dL — ABNORMAL HIGH (ref 65–99)
Glucose-Capillary: 163 mg/dL — ABNORMAL HIGH (ref 65–99)
Glucose-Capillary: 186 mg/dL — ABNORMAL HIGH (ref 65–99)

## 2017-12-19 LAB — BASIC METABOLIC PANEL
ANION GAP: 13 (ref 5–15)
BUN: 39 mg/dL — ABNORMAL HIGH (ref 6–20)
CALCIUM: 8.4 mg/dL — AB (ref 8.9–10.3)
CO2: 30 mmol/L (ref 22–32)
Chloride: 108 mmol/L (ref 101–111)
Creatinine, Ser: 1.24 mg/dL (ref 0.61–1.24)
GFR calc Af Amer: >60 mL/min (ref >60)
GFR calc non Af Amer: 59 mL/min — ABNORMAL LOW (ref >60)
Glucose, Bld: 212 mg/dL — ABNORMAL HIGH (ref 65–99)
POTASSIUM: 3.1 mmol/L — AB (ref 3.5–5.1)
SODIUM: 151 mmol/L — AB (ref 135–145)

## 2017-12-19 LAB — CULTURE, BLOOD (ROUTINE X 2)
Culture: NO GROWTH
Culture: NO GROWTH
Special Requests: ADEQUATE
Special Requests: ADEQUATE

## 2017-12-19 LAB — AEROBIC/ANAEROBIC CULTURE W GRAM STAIN (SURGICAL/DEEP WOUND): Culture: NORMAL

## 2017-12-19 LAB — AEROBIC/ANAEROBIC CULTURE (SURGICAL/DEEP WOUND): GRAM STAIN: NONE SEEN

## 2017-12-19 LAB — TRIGLYCERIDES: Triglycerides: 259 mg/dL — ABNORMAL HIGH (ref <150)

## 2017-12-19 MED ORDER — HYDRALAZINE HCL 20 MG/ML IJ SOLN
10.0000 mg | INTRAMUSCULAR | Status: DC | PRN
  Administered 2017-12-20: 10 mg via INTRAVENOUS
  Filled 2017-12-19: qty 1

## 2017-12-19 MED ORDER — FREE WATER: 200.0000 mL | Status: DC

## 2017-12-19 MED ORDER — FENTANYL CITRATE (PF) 100 MCG/2ML IJ SOLN
12.5000 ug | INTRAMUSCULAR | Status: DC | PRN
  Administered 2017-12-19 – 2017-12-21 (×7): 25 ug via INTRAVENOUS
  Administered 2017-12-21: 12.5 ug via INTRAVENOUS
  Administered 2017-12-22: 25 ug via INTRAVENOUS
  Administered 2017-12-22 (×3): 12.5 ug via INTRAVENOUS
  Administered 2017-12-22 – 2017-12-25 (×10): 25 ug via INTRAVENOUS
  Administered 2017-12-25: 12.5 ug via INTRAVENOUS
  Administered 2017-12-25: 25 ug via INTRAVENOUS
  Administered 2017-12-25: 12.5 ug via INTRAVENOUS
  Administered 2017-12-25: 25 ug via INTRAVENOUS
  Filled 2017-12-19 (×21): qty 2

## 2017-12-19 MED ORDER — ORAL CARE MOUTH RINSE
15.0000 mL | Freq: Two times a day (BID) | OROMUCOSAL | Status: DC
  Administered 2017-12-19 – 2017-12-23 (×5): 15 mL via OROMUCOSAL

## 2017-12-19 MED ORDER — CHLORHEXIDINE GLUCONATE 0.12 % MT SOLN
15.0000 mL | Freq: Two times a day (BID) | OROMUCOSAL | Status: DC
  Administered 2017-12-20 – 2017-12-28 (×15): 15 mL via OROMUCOSAL
  Filled 2017-12-19 (×15): qty 15

## 2017-12-19 NOTE — Procedures (Signed)
Extubation Procedure Note  Patient Details:   Name: Patrick Aguilar DOB: [birth year] MRN: [id number]   Airway Documentation:     Evaluation  O2 sats: 98 Complications: none Patient tolerated procedure well. Bilateral Breath Sounds: Diminished,   Pt not speaking at this time  Per CCM order, pt extubated and placed on nasal cannula.   Revonda HumphreyDePue, Gwynne Kemnitz S 12/19/2017, 9:53 AM

## 2017-12-19 NOTE — Progress Notes (Signed)
130 fentanyl wasted in sink. Aquilla SolianMegan Tucker, RN witness to waste.

## 2017-12-19 NOTE — Progress Notes (Signed)
SLP Cancellation Note  Patient Details Name: Patrick Aguilar MRN: [id number] DOB: 27-Jul-1951   Cancelled treatment:       Reason Eval/Treat Not Completed: Medical issues which prohibited therapy Pt was extubated this morning, after 6 days on ventilator. This length of intubation significantly increases risk of dysphagia post extubation. Additionally, pt has a history of GERD and COPD, the latter of which increases risk for silent aspiration. SLP will hold swallow evaluation at this time to insure pt tolerates being off the ventilator. Will recheck next date.  Patrick Aguilar, [facility]P, CCC-SLP Speech Language Pathologist [phone]  Patrick Aguilar, Patrick Aguilar 12/19/2017, 12:17 PM

## 2017-12-19 NOTE — Progress Notes (Signed)
PULMONARY / CRITICAL CARE MEDICINE   Name: Patrick FredericksonCharles W Klos MRN: [id number] DOB: [birth year]    ADMISSION DATE:  12/13/2017 CONSULTATION DATE:  12/13/2017  REFERRING MD:  Normajean Baxteralstedt (Urology)  CHIEF COMPLAINT:  Fournier's Gangrene, Septic Shock, ARDS  HISTORY OF PRESENT ILLNESS:   67 y/o male with septic shock from fournier's gangrene complicated by ARDS, AKI.  He has COPD, GERD, HTN, anxiety.    SUBJECTIVE:  Diuresed well Fever Renal function worse Sodium up Hypokalemia today  VITAL SIGNS: BP (!) 153/57   Pulse 91   Temp (!) 101.6 F (38.7 C) (Oral)   Resp 18   Ht 5\' 8"  (1.727 m)   Wt 207 lb 7.3 oz (94.1 kg)   SpO2 100%   BMI 31.54 kg/m   HEMODYNAMICS:    VENTILATOR SETTINGS: Vent Mode: PRVC FiO2 (%):  [30 %-40 %] 30 % Set Rate:  [18 bmp] 18 bmp Vt Set:  [480 mL] 480 mL PEEP:  [5 cmH20] 5 cmH20 Pressure Support:  [5 cmH20-8 cmH20] 5 cmH20 Plateau Pressure:  [8 cmH20-13 cmH20] 8 cmH20  INTAKE / OUTPUT: I/O last 3 completed shifts: In: 4689.5 [I.V.:2355.8; Other:50; NG/GT:1533.7; IV Piggyback:750] Out: 9030 [Urine:7330; Stool:1700]  PHYSICAL EXAMINATION:  General:  In bed on vent HENT: NCAT ETT in place PULM: Rhonci bilaterally B, vent supported breathing CV: RRR, no mgr GI: BS+, soft, nontender MSK: normal bulk and tone Neuro: sedated on vent    LABS:  BMET Recent Labs  Lab 12/17/17 0510 12/18/17 0520 12/19/17 0442  NA 146* 148* 151*  K 3.1* 3.2* 3.1*  CL 111 110 108  CO2 28 26 30   BUN 47* 41* 39*  CREATININE 1.43* 1.28* 1.24  GLUCOSE 133* 251* 212*    Electrolytes Recent Labs  Lab 12/14/17 0923 12/15/17 0431 12/15/17 1656  12/17/17 0510 12/18/17 0520 12/19/17 0442  CALCIUM 7.4* 7.8*  --    < > 8.5* 8.2* 8.4*  MG 2.1 2.1 2.2  --   --  1.9  --   PHOS 5.0* 6.0* 5.2*  --   --   --   --    < > = values in this interval not displayed.    CBC Recent Labs  Lab 12/16/17 0343 12/17/17 0510 12/18/17 0520  WBC 12.4* 20.4* 19.4*  HGB  8.8* 9.5* 9.1*  HCT 26.0* 27.8* 27.6*  PLT 244 280 293    Coag's Recent Labs  Lab 12/13/17 2304  INR 1.39    Sepsis Markers Recent Labs  Lab 12/13/17 2304 12/14/17 0205 12/14/17 0815 12/14/17 0923 12/15/17 0431  LATICACIDVEN 0.9 0.8 1.0  --   --   PROCALCITON 12.99  --   --  12.03 9.02    ABG Recent Labs  Lab 12/15/17 1445 12/16/17 0328 12/17/17 0415  PHART 7.316* 7.358 7.403  PCO2ART 42.9 41.9 42.4  PO2ART 101 99.2 125*    Liver Enzymes Recent Labs  Lab 12/13/17 1359 12/15/17 0431 12/16/17 0343  AST 47* 27 29  ALT 55 38 37  ALKPHOS 183* 170* 216*  BILITOT 1.0 0.6 0.8  ALBUMIN 2.5* 2.0* 2.0*    Cardiac Enzymes Recent Labs  Lab 12/13/17 1359  TROPONINI <0.03    Glucose Recent Labs  Lab 12/18/17 1124 12/18/17 1558 12/18/17 1937 12/18/17 2322 12/19/17 0307 12/19/17 0740  GLUCAP 185* 138* 205* 191* 147* 199*    Imaging Dg Chest Port 1 View  Result Date: 12/19/2017 CLINICAL DATA:  Endotracheal tube reposition. EXAM: PORTABLE CHEST 1 VIEW COMPARISON:  Yesterday at 0451 hour FINDINGS: Endotracheal tube at the thoracic inlet approximately 4.9 cm from the carina. Enteric tube in place with tip not included in the field of view. Right internal jugular central venous catheter tip in the SVC. Improving right lung aeration. Improving perihilar opacities. Slight worsening aeration at the left lung base. No pneumothorax. No large pleural effusion. IMPRESSION: 1. Endotracheal tube at the thoracic inlet approximately 4.9 cm from the carina. 2. Improving perihilar aeration which may be improving edema. Bibasilar opacities persist, left greater than right. Electronically Signed   By: Rubye Oaks M.D.   On: 12/19/2017 03:01     CULTURES: Blood 4/03 >> Scrotum 4/03 >>  Sputum 4/05 >>   ANTIBIOTICS: Vancomycin 4/3 >>4/10 Zosyn 4/3 >>4/10 Clindamycin 4/3 >> 4/10  SIGNIFICANT EVENTS: 4/03 Transfer from [facility], to OR for debridement of scrotum 4/04 add  giapreza 4/05 off giapreza 4/06 off pressors  LINES/TUBES: ETT 4/3 >> Rt IJ CVL 4/04 >>  Lt radial aline 4/04 >> 4/07   DISCUSSION: 67 y/o male who had septic shock from Fournier's Gangrene has done well since surgical debridement, has improving respiratory status.  Had a fever overnight 4/9, otherwise stable without clear sign of sepsis.   ASSESSMENT / PLAN:  PULMONARY A: ARDS, improved vent mechanics and oxygenation Acute pulmonary edema P:   SBT again this morning Full mechanical vent support VAP prevention Daily WUA/SBT Hold diuresis with hypernatremia  CARDIOVASCULAR A:  Septic shock: resolved P:  Tele Monitor hemodynamics  RENAL A:   Anasarca Hypokalemia Hypernatremia P:   Hold lasix Increase free water via tube Replace K again today Monitor BMET and UOP Replace electrolytes as needed   GASTROINTESTINAL A:   No acute issues P:   Continue tube feeding  Continue stress ulcer prophylaxis with pantoprazole  HEMATOLOGIC A:   Anemia of critical illness P:  Monitor for bleeding  INFECTIOUS A:   Fournier's gangrene Fever 4/9, but WBC down slightly, some improvement, no clear sign of new infection P:   If hemodynamically stable plan stopping antibiotics on 4/10 Send cultures if hemodynamics change  ENDOCRINE A:   Hyperglycemia: well controlled P:   SSI  NEUROLOGIC A:   Anxiety Need for sedation P:   RASS goal 0 Continue PAD Protocol for fentanyl gtt, prn versed   FAMILY  - Updates: none bedside  - Inter-disciplinary family meet or Palliative Care meeting due by:  day 7  My cc time 33 minutes  Heber Paradise, MD [place] [facility]M Pager: [phone] Cell: [phone] After 3pm or if no response, call [phone]    12/19/2017, 8:14 AM

## 2017-12-19 NOTE — ED Notes (Signed)
Blood cultures drawn X2 at 14:21 prior to antibiotics hung

## 2017-12-19 NOTE — Progress Notes (Signed)
eLink Physician-Brief Progress Note Patient Name: Patrick Aguilar DOB: 1950-10-06 MRN: [id number]   Date of Service  12/19/2017  HPI/Events of Note  Hypertension - BP = 140/123.  eICU Interventions  Will order: 1. Hydralazine 10 mg IV Q 4 hours PRN SBP > 170 or DBP > 100.     Intervention Category Major Interventions: Hypertension - evaluation and management  Patrick Aguilar,[name] 12/19/2017, 8:39 PM

## 2017-12-20 ENCOUNTER — Inpatient Hospital Stay (HOSPITAL_COMMUNITY): Payer: Medicare HMO

## 2017-12-20 DIAGNOSIS — M792 Neuralgia and neuritis, unspecified: Secondary | ICD-10-CM

## 2017-12-20 LAB — BASIC METABOLIC PANEL
Anion gap: 12 (ref 5–15)
Anion gap: 11 (ref 5–15)
BUN: 28 mg/dL — AB (ref 6–20)
BUN: 26 mg/dL — AB (ref 6–20)
CALCIUM: 8.4 mg/dL — AB (ref 8.9–10.3)
CHLORIDE: 106 mmol/L (ref 101–111)
CHLORIDE: 104 mmol/L (ref 101–111)
CO2: 29 mmol/L (ref 22–32)
CO2: 26 mmol/L (ref 22–32)
CREATININE: 0.93 mg/dL (ref 0.61–1.24)
CREATININE: 0.94 mg/dL (ref 0.61–1.24)
Calcium: 8.1 mg/dL — ABNORMAL LOW (ref 8.9–10.3)
GFR calc Af Amer: >60 mL/min (ref >60)
GFR calc Af Amer: >60 mL/min (ref >60)
GFR calc non Af Amer: >60 mL/min (ref >60)
GLUCOSE: 304 mg/dL — AB (ref 65–99)
Glucose, Bld: 123 mg/dL — ABNORMAL HIGH (ref 65–99)
Potassium: 2.2 mmol/L — CL (ref 3.5–5.1)
Potassium: 2.9 mmol/L — ABNORMAL LOW (ref 3.5–5.1)
SODIUM: 147 mmol/L — AB (ref 135–145)
Sodium: 141 mmol/L (ref 135–145)

## 2017-12-20 LAB — GLUCOSE, CAPILLARY
GLUCOSE-CAPILLARY: 150 mg/dL — AB (ref 65–99)
GLUCOSE-CAPILLARY: 171 mg/dL — AB (ref 65–99)
GLUCOSE-CAPILLARY: 237 mg/dL — AB (ref 65–99)
Glucose-Capillary: 144 mg/dL — ABNORMAL HIGH (ref 65–99)
Glucose-Capillary: 314 mg/dL — ABNORMAL HIGH (ref 65–99)

## 2017-12-20 MED ORDER — INSULIN ASPART 100 UNIT/ML ~~LOC~~ SOLN
0.0000 [IU] | Freq: Three times a day (TID) | SUBCUTANEOUS | Status: DC
  Administered 2017-12-20 – 2017-12-21 (×2): 3 [IU] via SUBCUTANEOUS
  Administered 2017-12-21: 8 [IU] via SUBCUTANEOUS
  Administered 2017-12-21 – 2017-12-22 (×2): 3 [IU] via SUBCUTANEOUS
  Administered 2017-12-22: 5 [IU] via SUBCUTANEOUS
  Administered 2017-12-22 – 2017-12-23 (×3): 3 [IU] via SUBCUTANEOUS
  Administered 2017-12-24: 5 [IU] via SUBCUTANEOUS
  Administered 2017-12-24: 8 [IU] via SUBCUTANEOUS
  Administered 2017-12-24 – 2017-12-25 (×3): 3 [IU] via SUBCUTANEOUS
  Administered 2017-12-26: 5 [IU] via SUBCUTANEOUS
  Administered 2017-12-26: 3 [IU] via SUBCUTANEOUS
  Administered 2017-12-26: 2 [IU] via SUBCUTANEOUS
  Administered 2017-12-27: 5 [IU] via SUBCUTANEOUS
  Administered 2017-12-27 – 2017-12-28 (×3): 3 [IU] via SUBCUTANEOUS

## 2017-12-20 MED ORDER — POTASSIUM CHLORIDE 10 MEQ/50ML IV SOLN
10.0000 meq | INTRAVENOUS | Status: AC
  Administered 2017-12-20 (×6): 10 meq via INTRAVENOUS
  Filled 2017-12-20 (×6): qty 50

## 2017-12-20 MED ORDER — POTASSIUM CHLORIDE 20 MEQ/15ML (10%) PO SOLN
40.0000 meq | Freq: Two times a day (BID) | ORAL | Status: AC
  Administered 2017-12-20 – 2017-12-21 (×4): 40 meq via ORAL
  Filled 2017-12-20 (×4): qty 30

## 2017-12-20 MED ORDER — GABAPENTIN 400 MG PO CAPS
800.0000 mg | ORAL_CAPSULE | Freq: Two times a day (BID) | ORAL | Status: DC
  Administered 2017-12-20: 800 mg via ORAL
  Filled 2017-12-20 (×2): qty 2

## 2017-12-20 MED ORDER — INSULIN ASPART 100 UNIT/ML ~~LOC~~ SOLN
0.0000 [IU] | Freq: Every day | SUBCUTANEOUS | Status: DC
  Administered 2017-12-20: 2 [IU] via SUBCUTANEOUS
  Administered 2017-12-22: 3 [IU] via SUBCUTANEOUS
  Administered 2017-12-27: 2 [IU] via SUBCUTANEOUS

## 2017-12-20 MED ORDER — GABAPENTIN 400 MG PO CAPS
800.0000 mg | ORAL_CAPSULE | Freq: Three times a day (TID) | ORAL | Status: DC
  Administered 2017-12-20 – 2017-12-22 (×6): 800 mg via ORAL
  Filled 2017-12-20 (×7): qty 2

## 2017-12-20 NOTE — Progress Notes (Signed)
eLink Physician-Brief Progress Note Patient Name: Patrick FredericksonCharles W Aguilar DOB: 18-Jan-1951 MRN: [id number]   Date of Service  12/20/2017  HPI/Events of Note  Patient with chronic nerve pain and is on Neurontin 800 mg PO QID at home.   eICU Interventions  Will order: 1. Neurontin 800 mg PO BID.     Intervention Category Intermediate Interventions: Pain - evaluation and management  [name] 12/20/2017, 4:34 AM

## 2017-12-20 NOTE — Evaluation (Signed)
Clinical/Bedside Swallow Evaluation Patient Details  Name: Patrick Aguilar MRN: [id number] Date of Birth: 01-13-1951  Today's Date: 12/20/2017 Time: SLP Start Time (ACUTE ONLY): 6578 SLP Stop Time (ACUTE ONLY): 1022 SLP Time Calculation (min) (ACUTE ONLY): 24 min  Past Medical History:  Past Medical History:  Diagnosis Date  . Anxiety   . Arthritis   . COPD (chronic obstructive pulmonary disease) (HCC)   . GERD (gastroesophageal reflux disease)   . Shortness of breath dyspnea    Past Surgical History:  Past Surgical History:  Procedure Laterality Date  . APPENDECTOMY    . BACK SURGERY     x5  . CARDIAC CATHETERIZATION  06   neg  . CERVICAL DISC SURGERY    . ORCHIECTOMY N/A 12/13/2017   Procedure: EXCISION OF SCROTUM AND DEBRIDEMENT OF PENIS;  Surgeon: Marcine Matar, MD;  Location: [facility] ORS;  Service: Urology;  Laterality: N/A;  . PLANTAR FASCIA SURGERY Bilateral   . shoulders Bilateral    rotator cuff  . SUBMANDIBULAR GLAND EXCISION Left 03/12/2015   Procedure: LEFT SUBMANDIBULAR GLAND RESECTION;  Surgeon: Christia Reading, MD;  Location: [facility] OR;  Service: ENT;  Laterality: Left;  . TONSILLECTOMY     HPI:  67 year old male admitted 12/13/17 wtih SOB x2 weeks. PMH significant for COPD, GERD. Pt intubated from 12/13/17 to 12/19/17. Orders received for swallow evaluation s/p extubation.   Assessment / Plan / Recommendation Clinical Impression  Pt presents with functional oropharyngeal swallow based on clinical swallow evaluation.  No indications of airway compromise with 3 ounce water test, increased work of breathing nor focal CN deficits. and voice is baseline per pt.  Although silent aspiration risk present - pt has a strong voice and cough and pt denies h/o dysphagia.   Recommend regular/thin diet = no slp follow up indicated.  SLP Visit Diagnosis: Dysphagia, unspecified (R13.10)    Aspiration Risk  Mild aspiration risk    Diet Recommendation Regular;Thin liquid   Liquid  Administration via: Cup;Straw Medication Administration: (as tolerated) Supervision: Patient able to self feed Compensations: Slow rate;Small sips/bites    Other  Recommendations Oral Care Recommendations: Oral care BID   Follow up Recommendations None      Frequency and Duration      n/a      Prognosis   n/a     Swallow Study   General Date of Onset: 12/20/17 HPI: 67 year old male admitted 12/13/17 wtih SOB x2 weeks. PMH significant for COPD, GERD. Pt intubated from 12/13/17 to 12/19/17. Orders received for swallow evaluation s/p extubation. Type of Study: Bedside Swallow Evaluation Diet Prior to this Study: Thin liquids(clear liquids) Temperature Spikes Noted: Yes(101.6) Respiratory Status: Nasal cannula History of Recent Intubation: Yes Length of Intubations (days): 6 days Date extubated: 12/19/17 Behavior/Cognition: Alert;Cooperative;Pleasant mood Oral Cavity Assessment: Within Functional Limits Oral Care Completed by SLP: No Oral Cavity - Dentition: Adequate natural dentition Vision: Functional for self-feeding Self-Feeding Abilities: Able to feed self Patient Positioning: Upright in bed Baseline Vocal Quality: Other (comment)(slightly hoarse, pt reports this is baseline) Volitional Cough: Strong(productive) Volitional Swallow: Able to elicit    Oral/Motor/Sensory Function Overall Oral Motor/Sensory Function: Within functional limits   Ice Chips Ice chips: Not tested   Thin Liquid Thin Liquid: Within functional limits Presentation: Cup;Self Fed;Straw Other Comments: 3 ounce water test completed with pt passing without difficulty    Nectar Thick Nectar Thick Liquid: Not tested   Honey Thick Honey Thick Liquid: Not tested   Puree Puree:  Within functional limits Presentation: Self Fed;Spoon Other Comments: throat clear x1 with and without intake   Solid   GO   Solid: Within functional limits Presentation: Self Orvan JulyFed        Zafiro Routson Ann 12/20/2017,10:29  AM  Donavan Burnetamara Claribel Sachs, MS [facility] SLP [phone]

## 2017-12-20 NOTE — Progress Notes (Signed)
Inpatient Diabetes Program Recommendations  AACE/ADA: New Consensus Statement on Inpatient Glycemic Control (2015)  Target Ranges:  Prepandial:   less than 140 mg/dL      Peak postprandial:   less than 180 mg/dL (1-2 hours)      Critically ill patients:  140 - 180 mg/dL   Results for Patrick Aguilar, Patrick Aguilar (MRN [id number]) as of 12/20/2017 10:43  Ref. Range 12/19/2017 23:23 12/20/2017 03:14 12/20/2017 08:06  Glucose-Capillary Latest Ref Range: 65 - 99 mg/dL 782186 (H) 956150 (H) 213144 (H)     Diabetes history: None  Outpatient Diabetes medications: None  Current Insulin Orders: Novolog Resistant Correction Scale/ SSI (0-20 units) Q4 hours      Novolog 4 units Q4 hours       MD- Note that patient Extubated and tube feedings have stopped.  Please Discontinue Novolog 4 units Q4 hours now that tube feeds have stopped.  Please start Novolog Moderate Correction Scale/ SSI (0-15 units) Q4 hours      --Will follow patient during hospitalization--  Ambrose FinlandJeannine Johnston Keshun Berrett RN, MSN, CDE Diabetes Coordinator Inpatient Glycemic Control Team Team Pager: [phone] (8a-5p)

## 2017-12-20 NOTE — Progress Notes (Addendum)
OT Cancellation Note  Patient Details Name: Patrick Aguilar MRN: [id number] DOB: [birth year]   Cancelled Treatment:    Reason Eval/Treat Not Completed: OT screened, no needs identified, will sign off.  Checked on pt earlier and he had just started K+.  When I returned this pm, he was moaning. Will check back tomorrow.  [name] 12/20/2017, 3:05 PM  Marica OtterMaryellen Kinnley Paulson, OTR/L [phone]

## 2017-12-20 NOTE — Progress Notes (Signed)
eLink Physician-Brief Progress Note Patient Name: Patrick FredericksonCharles W Aguilar DOB: 21-Mar-1951 MRN: [id number]   Date of Service  12/20/2017  HPI/Events of Note  K+ = 2.2 and Creatinine = 0.92.   eICU Interventions  Will order: 1. Replace K+.  2. BMP at 1 PM.      Intervention Category Major Interventions: Electrolyte abnormality - evaluation and management  Dejion Grillo Eugene 12/20/2017, 5:54 AM

## 2017-12-20 NOTE — Progress Notes (Addendum)
PULMONARY / CRITICAL CARE MEDICINE   Name: Patrick FredericksonCharles W Capelli MRN: [id number] DOB: 06-13-1951    ADMISSION DATE:  12/13/2017 CONSULTATION DATE:  12/13/2017  REFERRING MD:  Normajean Baxteralstedt (Urology)  CHIEF COMPLAINT:  Fournier's Gangrene, Septic Shock, ARDS  HISTORY OF PRESENT ILLNESS:   67 y/o male with septic shock from fournier's gangrene complicated by ARDS, AKI.  He has COPD, GERD, HTN, anxiety.    SUBJECTIVE:  Diuresed well Fever Renal function worse Sodium up Hypokalemia today  VITAL SIGNS: BP (!) 170/62   Pulse 79   Temp 97.9 F (36.6 C) (Oral)   Resp 18   Ht 5\' 8"  (1.727 m)   Wt 84.9 kg (187 lb 2.7 oz)   SpO2 98%   BMI 28.46 kg/m   HEMODYNAMICS:    VENTILATOR SETTINGS:    INTAKE / OUTPUT: I/O last 3 completed shifts: In: 3358.9 [P.O.:590; I.V.:1556.9; NG/GT:562; IV Piggyback:650] Out: 6480 [Urine:6030; Stool:450]  PHYSICAL EXAMINATION:  General: Chronically ill appearing resting comfortably in bed HENT: NCAT OP clear PULM: CTA B, normal effort CV: RRR, no mgr GI: BS+, soft, nontender GU: groin dressing in place MSK: normal bulk and tone Neuro: awake, alert, no distress, MAEW     LABS:  BMET Recent Labs  Lab 12/18/17 0520 12/19/17 0442 12/20/17 0449  NA 148* 151* 147*  K 3.2* 3.1* 2.2*  CL 110 108 106  CO2 26 30 29   BUN 41* 39* 28*  CREATININE 1.28* 1.24 0.93  GLUCOSE 251* 212* 123*    Electrolytes Recent Labs  Lab 12/14/17 0923 12/15/17 0431 12/15/17 1656  12/18/17 0520 12/19/17 0442 12/20/17 0449  CALCIUM 7.4* 7.8*  --    < > 8.2* 8.4* 8.4*  MG 2.1 2.1 2.2  --  1.9  --   --   PHOS 5.0* 6.0* 5.2*  --   --   --   --    < > = values in this interval not displayed.    CBC Recent Labs  Lab 12/16/17 0343 12/17/17 0510 12/18/17 0520  WBC 12.4* 20.4* 19.4*  HGB 8.8* 9.5* 9.1*  HCT 26.0* 27.8* 27.6*  PLT 244 280 293    Coag's Recent Labs  Lab 12/13/17 2304  INR 1.39    Sepsis Markers Recent Labs  Lab 12/13/17 2304  12/14/17 0205 12/14/17 0815 12/14/17 0923 12/15/17 0431  LATICACIDVEN 0.9 0.8 1.0  --   --   PROCALCITON 12.99  --   --  12.03 9.02    ABG Recent Labs  Lab 12/15/17 1445 12/16/17 0328 12/17/17 0415  PHART 7.316* 7.358 7.403  PCO2ART 42.9 41.9 42.4  PO2ART 101 99.2 125*    Liver Enzymes Recent Labs  Lab 12/13/17 1359 12/15/17 0431 12/16/17 0343  AST 47* 27 29  ALT 55 38 37  ALKPHOS 183* 170* 216*  BILITOT 1.0 0.6 0.8  ALBUMIN 2.5* 2.0* 2.0*    Cardiac Enzymes Recent Labs  Lab 12/13/17 1359  TROPONINI <0.03    Glucose Recent Labs  Lab 12/19/17 1113 12/19/17 1532 12/19/17 1955 12/19/17 2323 12/20/17 0314 12/20/17 0806  GLUCAP 163* 180* 279* 186* 150* 144*    Imaging Dg Chest Port 1 View  Result Date: 12/20/2017 CLINICAL DATA:  Respiratory failure EXAM: PORTABLE CHEST 1 VIEW COMPARISON:  12/19/2017 FINDINGS: Cardiac shadow is stable. Right jugular central line is noted. Endotracheal tube and nasogastric catheter have been removed in the interval. Postsurgical changes in the cervical spine are noted. Lungs are well aerated bilaterally. Mild vascular congestion is  again identified stable from the prior exam. IMPRESSION: No significant interval change from the prior exam. Electronically Signed   By: Alcide Clever M.D.   On: 12/20/2017 07:47     CULTURES: Blood 4/03 >> Scrotum 4/03 >> anaerococcus prevotti, GNR betalactamase positive Sputum 4/05 >> c albicans  ANTIBIOTICS: Vancomycin 4/3 >>4/10 Zosyn 4/3 >>4/10 Clindamycin 4/3 >> 4/10  SIGNIFICANT EVENTS: 4/03 Transfer from [facility], to OR for debridement of scrotum 4/04 add giapreza 4/05 off giapreza 4/06 off pressors 4/9 extubated  LINES/TUBES: ETT 4/3 >> Rt IJ CVL 4/04 >>  Lt radial aline 4/04 >> 4/07   DISCUSSION: 67 y/o male who had septic shock from Fournier's Gangrene has done well since surgical debridement, has improving respiratory status.  Had a fever overnight 4/9, otherwise stable  without clear sign of sepsis.   ASSESSMENT / PLAN:  PULMONARY A: ARDS, improved vent mechanics and oxygenation > resolved Acute pulmonary edema> improved P:   Tolerated extubation well  CARDIOVASCULAR A:  Septic shock: resolved P:  Tele Monitor hemodynamics  RENAL A:   Anasarca Hypokalemia P:   Replace K Monitor BMET and UOP Replace electrolytes as needed  GASTROINTESTINAL A:   No acute issues P:   Advance diet  Continue home ranitidine  HEMATOLOGIC A:   Anemia of critical illness, no bleeding P:  Monitor for bleeding  INFECTIOUS A:   Fournier's gangrene Fever 4/9, but WBC down slightly, some improvement, no clear sign of new infection P:   Stop antibiotics 4/10, monitor for fever, hemodynamic change or change in wound characteristics  ENDOCRINE A:   Hyperglycemia: well controlled P:   SSI  NEUROLOGIC A:   Anxiety Need for sedation P:   Advance home meds: gabapentin 800 change to tid   FAMILY  - Updates: none bedside  - Inter-disciplinary family meet or Palliative Care meeting due by:  day 7  Move to floor, TRH  Heber [place], MD [place] [facility]M Pager: [phone] Cell: [phone] After 3pm or if no response, call [phone]    12/20/2017, 11:29 AM

## 2017-12-20 NOTE — Progress Notes (Signed)
PT Cancellation Note  Patient Details Name: Patrick Aguilar MRN: [id number] DOB: [birth year]   Cancelled Treatment:    Reason Eval/Treat Not Completed: Patient not medically ready, R+RN would like to perform dressing changes and premedicate  Prior to PT. Will check back later today.    Rada HayHill, Anthonella Klausner Elizabeth 12/20/2017, 8:55 AM Blanchard KelchKaren Kabe Mckoy PT [phone]

## 2017-12-21 ENCOUNTER — Inpatient Hospital Stay (HOSPITAL_COMMUNITY): Payer: Medicare HMO

## 2017-12-21 DIAGNOSIS — I10 Essential (primary) hypertension: Secondary | ICD-10-CM | POA: Diagnosis present

## 2017-12-21 DIAGNOSIS — J449 Chronic obstructive pulmonary disease, unspecified: Secondary | ICD-10-CM | POA: Diagnosis present

## 2017-12-21 LAB — CBC WITH DIFFERENTIAL/PLATELET
Basophils Absolute: 0 10*3/uL (ref 0.0–0.1)
Basophils Relative: 0 %
Eosinophils Absolute: 0.4 10*3/uL (ref 0.0–0.7)
Eosinophils Relative: 2 %
HEMATOCRIT: 29.5 % — AB (ref 39.0–52.0)
Hemoglobin: 9.9 g/dL — ABNORMAL LOW (ref 13.0–17.0)
LYMPHS PCT: 14 %
Lymphs Abs: 2.5 10*3/uL (ref 0.7–4.0)
MCH: 29.4 pg (ref 26.0–34.0)
MCHC: 33.6 g/dL (ref 30.0–36.0)
MCV: 87.5 fL (ref 78.0–100.0)
MONO ABS: 1 10*3/uL (ref 0.1–1.0)
MONOS PCT: 5 %
NEUTROS ABS: 14.3 10*3/uL — AB (ref 1.7–7.7)
Neutrophils Relative %: 79 %
Platelets: 347 10*3/uL (ref 150–400)
RBC: 3.37 MIL/uL — ABNORMAL LOW (ref 4.22–5.81)
RDW: 15 % (ref 11.5–15.5)
WBC: 18.2 10*3/uL — ABNORMAL HIGH (ref 4.0–10.5)

## 2017-12-21 LAB — BASIC METABOLIC PANEL
ANION GAP: 14 (ref 5–15)
BUN: 21 mg/dL — AB (ref 6–20)
CALCIUM: 8.3 mg/dL — AB (ref 8.9–10.3)
CO2: 24 mmol/L (ref 22–32)
Chloride: 102 mmol/L (ref 101–111)
Creatinine, Ser: 0.85 mg/dL (ref 0.61–1.24)
GFR calc Af Amer: >60 mL/min (ref >60)
GFR calc non Af Amer: >60 mL/min (ref >60)
GLUCOSE: 183 mg/dL — AB (ref 65–99)
Potassium: 2.7 mmol/L — CL (ref 3.5–5.1)
Sodium: 140 mmol/L (ref 135–145)

## 2017-12-21 LAB — GLUCOSE, CAPILLARY
Glucose-Capillary: 188 mg/dL — ABNORMAL HIGH (ref 65–99)
Glucose-Capillary: 262 mg/dL — ABNORMAL HIGH (ref 65–99)
Glucose-Capillary: 152 mg/dL — ABNORMAL HIGH (ref 65–99)
Glucose-Capillary: 173 mg/dL — ABNORMAL HIGH (ref 65–99)

## 2017-12-21 MED ORDER — POTASSIUM CHLORIDE 10 MEQ/50ML IV SOLN
10.0000 meq | INTRAVENOUS | Status: DC
Start: 2017-12-21 — End: 2017-12-21
  Filled 2017-12-21 (×3): qty 50

## 2017-12-21 MED ORDER — SODIUM CHLORIDE 0.9 % IV SOLN
Freq: Once | INTRAVENOUS | Status: AC
  Administered 2017-12-21: 09:00:00 via INTRAVENOUS

## 2017-12-21 MED ORDER — MAGNESIUM SULFATE IN D5W 1-5 GM/100ML-% IV SOLN
1.0000 g | Freq: Once | INTRAVENOUS | Status: AC
  Administered 2017-12-21: 1 g via INTRAVENOUS
  Filled 2017-12-21: qty 100

## 2017-12-21 MED ORDER — FAMOTIDINE 20 MG PO TABS
20.0000 mg | ORAL_TABLET | Freq: Two times a day (BID) | ORAL | Status: DC
  Administered 2017-12-21 – 2017-12-28 (×14): 20 mg via ORAL
  Filled 2017-12-21 (×15): qty 1

## 2017-12-21 MED ORDER — ADULT MULTIVITAMIN W/MINERALS CH
1.0000 | ORAL_TABLET | Freq: Every day | ORAL | Status: DC
  Administered 2017-12-21 – 2017-12-28 (×7): 1 via ORAL
  Filled 2017-12-21 (×8): qty 1

## 2017-12-21 MED ORDER — POTASSIUM CHLORIDE 10 MEQ/100ML IV SOLN
10.0000 meq | INTRAVENOUS | Status: AC
  Administered 2017-12-21 (×4): 10 meq via INTRAVENOUS
  Filled 2017-12-21 (×4): qty 100

## 2017-12-21 NOTE — Evaluation (Signed)
Physical Therapy Evaluation Patient Details Name: Patrick Aguilar MRN: [id number] DOB: 11-12-1950 Today's Date: 12/21/2017   History of Present Illness  67 y/o male with septic shock from fournier's gangrene complicated by ARDS, AKI.  He has COPD, GERD, HTN, anxiety.  Clinical Impression  The patient is very motivated to participate in mobility. Aguilar admitted with above diagnosis. Aguilar currently with functional limitations due to the deficits listed below (see Aguilar Problem List). Aguilar will benefit from skilled Aguilar to increase their independence and safety with mobility to allow discharge to the venue listed below.       Follow Up Recommendations SNF    Equipment Recommendations  Rolling walker with 5" wheels    Recommendations for Other Services       Precautions / Restrictions Precautions Precautions: Fall Precaution Comments: I and D scrotum      Mobility  Bed Mobility Overal bed mobility: Needs Assistance Bed Mobility: Sit to Supine       Sit to supine: Min assist   General bed mobility comments: assist with legs  Transfers Overall transfer level: Needs assistance Equipment used: Rolling walker (2 wheeled) Transfers: Sit to/from [employer] Sit to Stand: Mod assist;+2 safety/equipment Stand pivot transfers: Mod assist;+2 safety/equipment       General transfer comment: assist to rise from the recliner x 2, 10 steps march in place at [facility].  pivot steps to back to bed using RW and mod assist.  Ambulation/Gait                Stairs            Wheelchair Mobility    Modified Rankin (Stroke Patients Only)       Balance                                             Pertinent Vitals/Pain Pain Assessment: 0-10 Pain Score: 10-Worst pain ever Pain Location: perineum when sitting down  Pain Descriptors / Indicators: Crushing;Discomfort;Grimacing;Guarding;Moaning Pain Intervention(s): Monitored during session;Premedicated  before session    Home Living Family/patient expects to be discharged to:: Private residence Living Arrangements: Alone Available Help at Discharge: Family Type of Home: House       Home Layout: Able to live on main level with bedroom/bathroom;Two level Home Equipment: None      Prior Function Level of Independence: Independent               Hand Dominance        Extremity/Trunk Assessment   Upper Extremity Assessment Upper Extremity Assessment: Defer to OT evaluation    Lower Extremity Assessment Lower Extremity Assessment: Generalized weakness    Cervical / Trunk Assessment Cervical / Trunk Assessment: Normal  Communication   Communication: No difficulties  Cognition Arousal/Alertness: Awake/alert Behavior During Therapy: WFL for tasks assessed/performed Overall Cognitive Status: Within Functional Limits for tasks assessed                                        General Comments      Exercises     Assessment/Plan    Aguilar Assessment Patient needs continued Aguilar services  Aguilar Problem List Decreased strength;Decreased knowledge of use of DME;Decreased activity tolerance;Decreased range of motion;Decreased safety awareness;Decreased skin integrity;Decreased knowledge of precautions;Pain;Decreased mobility  Aguilar Treatment Interventions DME instruction;Therapeutic exercise;Gait training;Functional mobility training;Therapeutic activities;Patient/family education    Aguilar Goals (Current goals can be found in the Care Plan section)  Acute Rehab Aguilar Goals Patient Stated Goal: to walk, heal Aguilar Goal Formulation: With patient Time For Goal Achievement: 01/04/18 Potential to Achieve Goals: Good    Frequency Min 3X/week   Barriers to discharge        Co-evaluation               AM-PAC Aguilar "6 Clicks" Daily Activity  Outcome Measure Difficulty turning over in bed (including adjusting bedclothes, sheets and blankets)?: Unable Difficulty  moving from lying on back to sitting on the side of the bed? : Unable Difficulty sitting down on and standing up from a chair with arms (e.g., wheelchair, bedside commode, etc,.)?: Unable Help needed moving to and from a bed to chair (including a wheelchair)?: Total Help needed walking in hospital room?: Total Help needed climbing 3-5 steps with a railing? : Total 6 Click Score: 6    End of Session Equipment Utilized During Treatment: Gait belt Activity Tolerance: Patient limited by fatigue;Patient limited by pain Patient left: in bed;with call bell/phone within reach;with bed alarm set Nurse Communication: Mobility status Aguilar Visit Diagnosis: Unsteadiness on feet (R26.81);Pain    Time: [id number] Aguilar Time Calculation (min) (ACUTE ONLY): 22 min   Charges:   Aguilar Evaluation $Aguilar Eval Moderate Complexity: 1 Mod     Aguilar G CodesBlanchard Aguilar:        Patrick Aguilar [phone]   Patrick Aguilar, Patrick Aguilar 12/21/2017, 4:08 PM

## 2017-12-21 NOTE — Progress Notes (Signed)
Inpatient Diabetes Program Recommendations  AACE/ADA: New Consensus Statement on Inpatient Glycemic Control (2015)  Target Ranges:  Prepandial:   less than 140 mg/dL      Peak postprandial:   less than 180 mg/dL (1-2 hours)      Critically ill patients:  140 - 180 mg/dL   Results for Tish FredericksonWHITT, Domnick W (MRN [id number]) as of 12/21/2017 10:05  Ref. Range 12/20/2017 08:06 12/20/2017 12:08 12/20/2017 16:45 12/20/2017 21:26  Glucose-Capillary Latest Ref Range: 65 - 99 mg/dL 829144 (H)  3 units NOVOLOG 314 (H)  15 units NOVOLOG 171 (H)  3 units NOVOLOG 237 (H)  2 units NOVOLOG    Results for Tish FredericksonWHITT, Abrham W (MRN [id number]) as of 12/21/2017 10:05  Ref. Range 12/21/2017 07:32  Glucose-Capillary Latest Ref Range: 65 - 99 mg/dL 784188 (H)   Results for Tish FredericksonWHITT, Bruin W (MRN [id number]) as of 12/21/2017 10:05  Ref. Range 12/13/2017 22:23  Hemoglobin A1C Latest Ref Range: 4.8 - 5.6 % 7.4 (H)    Admit with: Fournier's gangrene   History: NO History of DM noted     Current Insulin Orders: Novolog Moderate Correction Scale/ SSI (0-15 units) TID AC + HS         MD- Do not see that patient has documented History of DM.  Is this a new diagnosis of DM?  Hemoglobin A1c what was drawn on 12/13/17 shows result of 7.4% which is consistent with diagnosis of DM per ADA Standards of Care.  Please address with the patient so that the nursing staff can begin teaching pt basic DM home care like CBG meter teaching, DM diet, etc.  Patient may likely need oral DM meds for home.  Could try Metformin 500 mg BID for home to start and have pt follow up with his PCP, Dr. [place]ll BurgerShah in [place].      --Will follow patient during hospitalization--  Ambrose FinlandJeannine Johnston Castin Donaghue RN, MSN, CDE Diabetes Coordinator Inpatient Glycemic Control Team Team Pager: [phone] (8a-5p)

## 2017-12-21 NOTE — Progress Notes (Signed)
Met with patient and his daughter earlier this AM.  Pt told me he has never been diagnosed with DM before.  No family Hx of DM.  His PCP, Dr. Manuella Ghazi in [place] has been checking his A1c levels over the past several years and, according to the patient, his last A1c checked was around 6%.  Spoke with patient and pt's daughter about his current A1c of 7.4%.  Explained what an A1c is and what it measures.  Explained that an A1c of 6.5% or higher is indicative of a positive diagnosis of DM, however, the MD will need to make that diagnosis as I am an RN and cannot make diagnoses for pt.  Discussed w/ pt and daughter that many factors can affect Hemoglobin A1c levels and glucose levels including stress, infections, steroids, illness, etc.  Explained to pt and daughter that his extensive infection may have led to an elevated A1c level or it's also possible that pt had underlying DM and that his elevated CBGs could have predisposed him to the severe infection he has.  Explained to pt that we will be checking his CBGs frequently in the hospital and that we will continue to give him insulin to help better manage his CBGs to promote healing.  Pt agreeable to plan.  Pt stated he doesn't want to take "shots" at home.  Stated he will make changes to his lifestyle and diet when he gets home so that he can avoid taking shots.  Explained to pt that based on his current A1c level of 7.4%, pt may only need oral therapy to help manage CBGs at home.     --Will follow patient during hospitalization--  Wyn Quaker RN, MSN, CDE Diabetes Coordinator Inpatient Glycemic Control Team Team Pager: [phone] (8a-5p)

## 2017-12-21 NOTE — H&P (View-Only) (Signed)
Reason for Consult:Fournier's gangrene  Referring Physician: Dr. S. Dahlstedt, Urology Service Inpatient Consult [facility]  Date :12/21/17   Berel W Shearman is an 67 y.o. male.  HPI: Corbyn W Klemann is a 67 yo male who was admitted in transfer from [facility] with sepsis/shock from Fournier's gangrene on 12/13/17. Underwent scrotal debridement and total scrotectomy 12/14/17.  Course complicated by ARDS and AKI. Remained on vent with sepsis requiring pressors post op. Extubated 12/19/17. Local care to area with daily dressing changes. Has loose stools, so Flexseal in place, but oozing around it today. WBC still 18.2 and monitoring off antibiotics at this point , renal function much improved. Cultures from OR: NORMAL SKIN FLORA  MODERATE ANAEROCOCCUS PREVOTII  MODERATE ANAEROBIC GRAM NEGATIVE ROD  BETA LACTAMASE POSITIVE  We are asked to see for management of residual wound following total scrotectomy.   Past medical history as noted with COPD,HTN,  GERD, arthritis but the patient reports no know history of diabetes prior to admit to hospital. CBG's remain elevated. Hgb A1C 7.4 on admit.   Past Medical History:  Diagnosis Date  . Anxiety   . Arthritis   . COPD (chronic obstructive pulmonary disease) (HCC)   . GERD (gastroesophageal reflux disease)   . Shortness of breath dyspnea     Past Surgical History:  Procedure Laterality Date  . APPENDECTOMY    . BACK SURGERY     x5  . CARDIAC CATHETERIZATION  06   neg  . CERVICAL DISC SURGERY    . ORCHIECTOMY N/A 12/13/2017   Procedure: EXCISION OF SCROTUM AND DEBRIDEMENT OF PENIS;  Surgeon: Dahlstedt, Stephen, MD;  Location: [facility];  Service: Urology;  Laterality: N/A;  . PLANTAR FASCIA SURGERY Bilateral   . shoulders Bilateral    rotator cuff  . SUBMANDIBULAR GLAND EXCISION Left 03/12/2015   Procedure: LEFT SUBMANDIBULAR GLAND RESECTION;  Surgeon: Dwight Bates, MD;  Location: [facility] OR;  Service: ENT;  Laterality: Left;  . TONSILLECTOMY       History reviewed. No pertinent family history. / Social History:  reports that he has been smoking cigarettes.  He has a 50.00 pack-year smoking history. He has never used smokeless tobacco. He reports that he does not drink alcohol or use drugs.  Allergies:  Allergies  Allergen Reactions  . Morphine And Related Itching    Medications: I have reviewed the patient's current medications.  Results for orders placed or performed during the hospital encounter of 12/13/17 (from the past 48 hour(s))  Glucose, capillary     Status: Abnormal   Collection Time: 12/19/17  3:32 PM  Result Value Ref Range   Glucose-Capillary 180 (H) 65 - 99 mg/dL  Glucose, capillary     Status: Abnormal   Collection Time: 12/19/17  7:55 PM  Result Value Ref Range   Glucose-Capillary 279 (H) 65 - 99 mg/dL   Comment 1 Notify RN    Comment 2 Document in Chart   Glucose, capillary     Status: Abnormal   Collection Time: 12/19/17 11:23 PM  Result Value Ref Range   Glucose-Capillary 186 (H) 65 - 99 mg/dL   Comment 1 Notify RN    Comment 2 Document in Chart   Glucose, capillary     Status: Abnormal   Collection Time: 12/20/17  3:14 AM  Result Value Ref Range   Glucose-Capillary 150 (H) 65 - 99 mg/dL   Comment 1 Notify RN    Comment 2 Document in Chart   Basic metabolic   panel     Status: Abnormal   Collection Time: 12/20/17  4:49 AM  Result Value Ref Range   Sodium 147 (H) 135 - 145 mmol/L   Potassium 2.2 (LL) 3.5 - 5.1 mmol/L    Comment: CRITICAL RESULT CALLED TO, READ BACK BY AND VERIFIED WITH: D GODFREY,RN @0542 12/20/17 MKELLY    Chloride 106 101 - 111 mmol/L   CO2 29 22 - 32 mmol/L   Glucose, Bld 123 (H) 65 - 99 mg/dL   BUN 28 (H) 6 - 20 mg/dL   Creatinine, Ser 0.93 0.61 - 1.24 mg/dL   Calcium 8.4 (L) 8.9 - 10.3 mg/dL   GFR calc non Af Amer >60 >60 mL/min   GFR calc Af Amer >60 >60 mL/min    Comment: (NOTE) The eGFR has been calculated using the CKD EPI equation. This calculation has not  been validated in all clinical situations. eGFR's persistently <60 mL/min signify possible Chronic Kidney Disease.    Anion gap 12 5 - 15    Comment: Performed at [facility], [address] [place], [place] [address]  Glucose, capillary     Status: Abnormal   Collection Time: 12/20/17  8:06 AM  Result Value Ref Range   Glucose-Capillary 144 (H) 65 - 99 mg/dL   Comment 1 Notify RN    Comment 2 Document in Chart   Glucose, capillary     Status: Abnormal   Collection Time: 12/20/17 12:08 PM  Result Value Ref Range   Glucose-Capillary 314 (H) 65 - 99 mg/dL  Basic metabolic panel     Status: Abnormal   Collection Time: 12/20/17  1:43 PM  Result Value Ref Range   Sodium 141 135 - 145 mmol/L   Potassium 2.9 (L) 3.5 - 5.1 mmol/L    Comment: DELTA CHECK NOTED REPEATED TO VERIFY NO VISIBLE HEMOLYSIS    Chloride 104 101 - 111 mmol/L   CO2 26 22 - 32 mmol/L   Glucose, Bld 304 (H) 65 - 99 mg/dL   BUN 26 (H) 6 - 20 mg/dL   Creatinine, Ser 0.94 0.61 - 1.24 mg/dL   Calcium 8.1 (L) 8.9 - 10.3 mg/dL   GFR calc non Af Amer >60 >60 mL/min   GFR calc Af Amer >60 >60 mL/min    Comment: (NOTE) The eGFR has been calculated using the CKD EPI equation. This calculation has not been validated in all clinical situations. eGFR's persistently <60 mL/min signify possible Chronic Kidney Disease.    Anion gap 11 5 - 15    Comment: Performed at [facility], [address] [place], [place] [address]  Glucose, capillary     Status: Abnormal   Collection Time: 12/20/17  4:45 PM  Result Value Ref Range   Glucose-Capillary 171 (H) 65 - 99 mg/dL  Glucose, capillary     Status: Abnormal   Collection Time: 12/20/17  9:26 PM  Result Value Ref Range   Glucose-Capillary 237 (H) 65 - 99 mg/dL  Basic metabolic panel     Status: Abnormal   Collection Time: 12/21/17  4:51 AM  Result Value Ref Range   Sodium 140 135 - 145 mmol/L   Potassium 2.7 (LL) 3.5 - 5.1 mmol/L     Comment: CRITICAL RESULT CALLED TO, READ BACK BY AND VERIFIED WITH: I P TEREN,RN @0541 12/21/17 MKELLY    Chloride 102 101 - 111 mmol/L   CO2 24 22 - 32 mmol/L   Glucose, Bld 183 (H) 65 -   99 mg/dL   BUN 21 (H) 6 - 20 mg/dL   Creatinine, Ser 0.85 0.61 - 1.24 mg/dL   Calcium 8.3 (L) 8.9 - 10.3 mg/dL   GFR calc non Af Amer >60 >60 mL/min   GFR calc Af Amer >60 >60 mL/min    Comment: (NOTE) The eGFR has been calculated using the CKD EPI equation. This calculation has not been validated in all clinical situations. eGFR's persistently <60 mL/min signify possible Chronic Kidney Disease.    Anion gap 14 5 - 15    Comment: Performed at [facility], [address] [place], [place] [address]  CBC with Differential/Platelet     Status: Abnormal   Collection Time: 12/21/17  4:51 AM  Result Value Ref Range   WBC 18.2 (H) 4.0 - 10.5 K/uL   RBC 3.37 (L) 4.22 - 5.81 MIL/uL   Hemoglobin 9.9 (L) 13.0 - 17.0 g/dL   HCT 29.5 (L) 39.0 - 52.0 %   MCV 87.5 78.0 - 100.0 fL   MCH 29.4 26.0 - 34.0 pg   MCHC 33.6 30.0 - 36.0 g/dL   RDW 15.0 11.5 - 15.5 %   Platelets 347 150 - 400 K/uL   Neutrophils Relative % 79 %   Neutro Abs 14.3 (H) 1.7 - 7.7 K/uL   Lymphocytes Relative 14 %   Lymphs Abs 2.5 0.7 - 4.0 K/uL   Monocytes Relative 5 %   Monocytes Absolute 1.0 0.1 - 1.0 K/uL   Eosinophils Relative 2 %   Eosinophils Absolute 0.4 0.0 - 0.7 K/uL   Basophils Relative 0 %   Basophils Absolute 0.0 0.0 - 0.1 K/uL    Comment: Performed at [facility], [address] [place], [place] [address]  Glucose, capillary     Status: Abnormal   Collection Time: 12/21/17  7:32 AM  Result Value Ref Range   Glucose-Capillary 188 (H) 65 - 99 mg/dL  Glucose, capillary     Status: Abnormal   Collection Time: 12/21/17 11:54 AM  Result Value Ref Range   Glucose-Capillary 262 (H) 65 - 99 mg/dL    Dg Chest Port 1 View  Result Date: 12/21/2017 CLINICAL DATA:  Acute respiratory  failure with hypoxemia EXAM: PORTABLE CHEST 1 VIEW COMPARISON:  Yesterday FINDINGS: Low volume chest with indistinct reticular opacities at the bases. Normal heart size. Aortic tortuosity. No visible effusion or pneumothorax. IMPRESSION: Mild lower lung reticulation is stable from yesterday. There has been significant improvement in aeration since admission study 12/13/2017. Electronically Signed   By: Jonathon  Watts M.D.   On: 12/21/2017 08:02   Dg Chest Port 1 View  Result Date: 12/20/2017 CLINICAL DATA:  Respiratory failure EXAM: PORTABLE CHEST 1 VIEW COMPARISON:  12/19/2017 FINDINGS: Cardiac shadow is stable. Right jugular central line is noted. Endotracheal tube and nasogastric catheter have been removed in the interval. Postsurgical changes in the cervical spine are noted. Lungs are well aerated bilaterally. Mild vascular congestion is again identified stable from the prior exam. IMPRESSION: No significant interval change from the prior exam. Electronically Signed   By: Mark  Lukens M.D.   On: 12/20/2017 07:47    ROS Blood pressure 139/76, pulse 77, temperature 98 F (36.7 C), temperature source Oral, resp. rate 15, height 5' 8" (1.727 m), weight 89.4 kg (197 lb 1.5 oz), SpO2 91 %. Physical Exam  Constitutional: No distress.  Thin elderly male lying in bed in NAD. Pain with cleaning of scrotal/perineal areas which are heavily soiled with   fecal matter.   HENT:  Head: Normocephalic and atraumatic.  Eyes: Pupils are equal, round, and reactive to light. EOM are normal.  Cardiovascular: Normal rate and regular rhythm.  Respiratory: Effort normal. No respiratory distress.  GI: Soft. He exhibits no distension.  Genitourinary:  Genitourinary Comments: S/P scrotectomy- testes exposed to tunica vaginalis layer. Wound into perineum, but no peri anal involvement. Slight involvement of the SP area and at the base of the penis. After cleaning the area which was heavily soiled with stool (leaking around  Flexiseal) the wound looks grossly clean and without much residual slough. Foley draining clear yellow urine  Neurological: He is alert.    Assessment/Plan: POD #7 following total scrotectomy for Fournier's gangrene- Plan for OR next week for irrigation and debridement and surgical prep of the residual wound with placement of Acell and possible placement of testes in subcutaneous thigh pockets and possible local scrotal advancement flaps.  Will plan for OR possibly next Monday pending OR schedule.   Also: Would consider checking C diff if patient continues with loose stools.  Advance diet as able and add protein supplement if okay with primary service at this point to facilitate wound healing. Also add multi-vitamin, vitamin C and Zinc if okay with primary service.   Ayrton Mcvay,PA-C Plastic Surgery [phone]     

## 2017-12-21 NOTE — Progress Notes (Signed)
8 Days Post-Op Subjective: Patient reports feeling tired. Having stool come around the rectal tube  Objective: Vital signs in last 24 hours: Temp:  [97.3 F (36.3 C)-99.2 F (37.3 C)] 98 F (36.7 C) (04/11 0549) Pulse Rate:  [77-85] 77 (04/11 0549) Resp:  [14-24] 15 (04/11 0549) BP: (139-148)/(69-95) 139/76 (04/11 0549) SpO2:  [91 %-97 %] 91 % (04/11 0549) Weight:  [89.4 kg (197 lb 1.5 oz)] 89.4 kg (197 lb 1.5 oz) (04/11 0549)  Intake/Output from previous day: 04/10 0701 - 04/11 0700 In: 660 [P.O.:660] Out: 3875 [Urine:3575; Stool:300] Intake/Output this shift: Total I/O In: 240 [P.O.:240] Out: -   Physical Exam:  Constitutional: Vital signs reviewed. WD WN in NAD   Extremities: No cyanosis or edema   Lab Results: Recent Labs    12/21/17 0451  HGB 9.9*  HCT 29.5*   BMET Recent Labs    12/20/17 1343 12/21/17 0451  NA 141 140  K 2.9* 2.7*  CL 104 102  CO2 26 24  GLUCOSE 304* 183*  BUN 26* 21*  CREATININE 0.94 0.85  CALCIUM 8.1* 8.3*   No results for input(s): LABPT, INR in the last 72 hours. No results for input(s): LABURIN in the last 72 hours. Results for orders placed or performed during the hospital encounter of 12/13/17  Blood Culture (routine x 2)     Status: None   Collection Time: 12/13/17  1:59 PM  Result Value Ref Range Status   Specimen Description BLOOD RIGHT WRIST  Final   Special Requests   Final    BOTTLES DRAWN AEROBIC AND ANAEROBIC Blood Culture adequate volume   Culture   Final    NO GROWTH 5 DAYS Performed at [facility], [address], [place], [place] [address]    Report Status 12/18/2017 FINAL  Final  Blood Culture (routine x 2)     Status: None   Collection Time: 12/13/17  2:05 PM  Result Value Ref Range Status   Specimen Description BLOOD RIGHT FOREARM  Final   Special Requests   Final    BOTTLES DRAWN AEROBIC AND ANAEROBIC Blood Culture adequate volume   Culture   Final    NO GROWTH 5 DAYS Performed at [facility], [address], [place], [place] [address]    Report Status 12/18/2017 FINAL  Final  Aerobic/Anaerobic Culture (surgical/deep wound)     Status: None   Collection Time: 12/13/17  9:01 PM  Result Value Ref Range Status   Specimen Description   Final    WOUND Performed at [facility], [address], [place], [place] [address]    Special Requests   Final    NONE Performed at [facility], [address] [place], [place] [address]    Gram Stain   Final    NO WBC SEEN ABUNDANT GRAM POSITIVE COCCI RARE GRAM POSITIVE RODS    Culture   Final    NORMAL SKIN FLORA MODERATE ANAEROCOCCUS PREVOTII MODERATE ANAEROBIC GRAM NEGATIVE ROD BETA LACTAMASE POSITIVE Performed at [facility], [address] [place], [place] [address]    Report Status 12/19/2017 FINAL  Final  Culture, blood (routine x 2)     Status: None   Collection Time: 12/13/17 11:04 PM  Result Value Ref Range Status   Specimen Description   Final    BLOOD RIGHT HAND Performed at [facility], [address] [place], [place] [address]    Special Requests   Final  BOTTLES DRAWN AEROBIC AND ANAEROBIC Blood Culture adequate volume Performed at [facility], [address] [place], [place] [address]    Culture   Final    NO GROWTH 5 DAYS Performed at [facility], [address] [place], [place] [address]    Report Status 12/19/2017 FINAL  Final  Culture, blood (routine x 2)     Status: None   Collection Time: 12/13/17 11:04 PM  Result Value Ref Range Status   Specimen Description   Final    BLOOD LEFT ANTECUBITAL Performed at [facility], [address] [place], [place] [address]    Special Requests   Final    BOTTLES DRAWN AEROBIC AND ANAEROBIC Blood Culture adequate volume Performed at [facility], [address], [place], [place] [address]    Culture   Final    NO GROWTH 5  DAYS Performed at [facility], [address] [place], [place] [address]    Report Status 12/19/2017 FINAL  Final  MRSA PCR Screening     Status: None   Collection Time: 12/14/17  1:53 AM  Result Value Ref Range Status   MRSA by PCR NEGATIVE NEGATIVE Final    Comment:        The GeneXpert MRSA Assay (FDA approved for NASAL specimens only), is one component of a comprehensive MRSA colonization surveillance program. It is not intended to diagnose MRSA infection nor to guide or monitor treatment for MRSA infections. Performed at [facility], [address], [place], [place] [address]   Culture, respiratory (NON-Expectorated)     Status: None   Collection Time: 12/15/17  3:03 AM  Result Value Ref Range Status   Specimen Description   Final    TRACHEAL ASPIRATE Performed at [facility], [address] [place], [place] [address]    Special Requests   Final    NONE Performed at [facility], [address] [place], [place] [address]    Gram Stain   Final    ABUNDANT WBC PRESENT, PREDOMINANTLY PMN ABUNDANT YEAST Performed at [facility], [address], [place], [place] [address]    Culture ABUNDANT CANDIDA ALBICANS  Final   Report Status 12/17/2017 FINAL  Final    Studies/Results: Dg Chest Port 1 View  Result Date: 12/21/2017 CLINICAL DATA:  Acute respiratory failure with hypoxemia EXAM: PORTABLE CHEST 1 VIEW COMPARISON:  Yesterday FINDINGS: Low volume chest with indistinct reticular opacities at the bases. Normal heart size. Aortic tortuosity. No visible effusion or pneumothorax. IMPRESSION: Mild lower lung reticulation is stable from yesterday. There has been significant improvement in aeration since admission study 12/13/2017. Electronically Signed   By: Marnee Spring M.D.   On: 12/21/2017 08:02   Dg Chest Port 1 View  Result Date: 12/20/2017 CLINICAL DATA:  Respiratory failure EXAM: PORTABLE  CHEST 1 VIEW COMPARISON:  12/19/2017 FINDINGS: Cardiac shadow is stable. Right jugular central line is noted. Endotracheal tube and nasogastric catheter have been removed in the interval. Postsurgical changes in the cervical spine are noted. Lungs are well aerated bilaterally. Mild vascular congestion is again identified stable from the prior exam. IMPRESSION: No significant interval change from the prior exam. Electronically Signed   By: Alcide Clever M.D.   On: 12/20/2017 07:47    Assessment/Plan:   1 wk s/p recection of bladder for Fourniere's gangrene. HE is doing well. Dressing changed just before I came today by  Plastics PA so I did not re-do. I appreciate Plastics input re: eventual closure.   Will cont to follow   LOS: 8 days   Patrick Aguilar 12/21/2017, 11:09 AM

## 2017-12-21 NOTE — Consult Note (Signed)
Reason for Consult:Fournier's gangrene  Referring Physician: Dr. S. Dahlstedt, Urology Service Inpatient Consult [facility]  Date :12/21/17   Patrick Aguilar is an 66 y.o. male.  HPI: Patrick Aguilar is a 66 yo male who was admitted in transfer from [facility] with sepsis/shock from Fournier's gangrene on 12/13/17. Underwent scrotal debridement and total scrotectomy 12/14/17.  Course complicated by ARDS and AKI. Remained on vent with sepsis requiring pressors post op. Extubated 12/19/17. Local care to area with daily dressing changes. Has loose stools, so Flexseal in place, but oozing around it today. WBC still 18.2 and monitoring off antibiotics at this point , renal function much improved. Cultures from OR: NORMAL SKIN FLORA  MODERATE ANAEROCOCCUS PREVOTII  MODERATE ANAEROBIC GRAM NEGATIVE ROD  BETA LACTAMASE POSITIVE  We are asked to see for management of residual wound following total scrotectomy.   Past medical history as noted with COPD,HTN,  GERD, arthritis but the patient reports no know history of diabetes prior to admit to hospital. CBG's remain elevated. Hgb A1C 7.4 on admit.   Past Medical History:  Diagnosis Date  . Anxiety   . Arthritis   . COPD (chronic obstructive pulmonary disease) (HCC)   . GERD (gastroesophageal reflux disease)   . Shortness of breath dyspnea     Past Surgical History:  Procedure Laterality Date  . APPENDECTOMY    . BACK SURGERY     x5  . CARDIAC CATHETERIZATION  06   neg  . CERVICAL DISC SURGERY    . ORCHIECTOMY N/A 12/13/2017   Procedure: EXCISION OF SCROTUM AND DEBRIDEMENT OF PENIS;  Surgeon: Dahlstedt, Stephen, MD;  Location: [facility];  Service: Urology;  Laterality: N/A;  . PLANTAR FASCIA SURGERY Bilateral   . shoulders Bilateral    rotator cuff  . SUBMANDIBULAR GLAND EXCISION Left 03/12/2015   Procedure: LEFT SUBMANDIBULAR GLAND RESECTION;  Surgeon: Dwight Bates, MD;  Location: [facility] OR;  Service: ENT;  Laterality: Left;  . TONSILLECTOMY       History reviewed. No pertinent family history. / Social History:  reports that he has been smoking cigarettes.  He has a 50.00 pack-year smoking history. He has never used smokeless tobacco. He reports that he does not drink alcohol or use drugs.  Allergies:  Allergies  Allergen Reactions  . Morphine And Related Itching    Medications: I have reviewed the patient's current medications.  Results for orders placed or performed during the hospital encounter of 12/13/17 (from the past 48 hour(s))  Glucose, capillary     Status: Abnormal   Collection Time: 12/19/17  3:32 PM  Result Value Ref Range   Glucose-Capillary 180 (H) 65 - 99 mg/dL  Glucose, capillary     Status: Abnormal   Collection Time: 12/19/17  7:55 PM  Result Value Ref Range   Glucose-Capillary 279 (H) 65 - 99 mg/dL   Comment 1 Notify RN    Comment 2 Document in Chart   Glucose, capillary     Status: Abnormal   Collection Time: 12/19/17 11:23 PM  Result Value Ref Range   Glucose-Capillary 186 (H) 65 - 99 mg/dL   Comment 1 Notify RN    Comment 2 Document in Chart   Glucose, capillary     Status: Abnormal   Collection Time: 12/20/17  3:14 AM  Result Value Ref Range   Glucose-Capillary 150 (H) 65 - 99 mg/dL   Comment 1 Notify RN    Comment 2 Document in Chart   Basic metabolic   panel     Status: Abnormal   Collection Time: 12/20/17  4:49 AM  Result Value Ref Range   Sodium 147 (H) 135 - 145 mmol/L   Potassium 2.2 (LL) 3.5 - 5.1 mmol/L    Comment: CRITICAL RESULT CALLED TO, READ BACK BY AND VERIFIED WITH: D GODFREY,RN @0542 12/20/17 MKELLY    Chloride 106 101 - 111 mmol/L   CO2 29 22 - 32 mmol/L   Glucose, Bld 123 (H) 65 - 99 mg/dL   BUN 28 (H) 6 - 20 mg/dL   Creatinine, Ser 0.93 0.61 - 1.24 mg/dL   Calcium 8.4 (L) 8.9 - 10.3 mg/dL   GFR calc non Af Amer >60 >60 mL/min   GFR calc Af Amer >60 >60 mL/min    Comment: (NOTE) The eGFR has been calculated using the CKD EPI equation. This calculation has not  been validated in all clinical situations. eGFR's persistently <60 mL/min signify possible Chronic Kidney Disease.    Anion gap 12 5 - 15    Comment: Performed at [facility], [address] [place], [place] [address]  Glucose, capillary     Status: Abnormal   Collection Time: 12/20/17  8:06 AM  Result Value Ref Range   Glucose-Capillary 144 (H) 65 - 99 mg/dL   Comment 1 Notify RN    Comment 2 Document in Chart   Glucose, capillary     Status: Abnormal   Collection Time: 12/20/17 12:08 PM  Result Value Ref Range   Glucose-Capillary 314 (H) 65 - 99 mg/dL  Basic metabolic panel     Status: Abnormal   Collection Time: 12/20/17  1:43 PM  Result Value Ref Range   Sodium 141 135 - 145 mmol/L   Potassium 2.9 (L) 3.5 - 5.1 mmol/L    Comment: DELTA CHECK NOTED REPEATED TO VERIFY NO VISIBLE HEMOLYSIS    Chloride 104 101 - 111 mmol/L   CO2 26 22 - 32 mmol/L   Glucose, Bld 304 (H) 65 - 99 mg/dL   BUN 26 (H) 6 - 20 mg/dL   Creatinine, Ser 0.94 0.61 - 1.24 mg/dL   Calcium 8.1 (L) 8.9 - 10.3 mg/dL   GFR calc non Af Amer >60 >60 mL/min   GFR calc Af Amer >60 >60 mL/min    Comment: (NOTE) The eGFR has been calculated using the CKD EPI equation. This calculation has not been validated in all clinical situations. eGFR's persistently <60 mL/min signify possible Chronic Kidney Disease.    Anion gap 11 5 - 15    Comment: Performed at [facility], [address] [place], [place] [address]  Glucose, capillary     Status: Abnormal   Collection Time: 12/20/17  4:45 PM  Result Value Ref Range   Glucose-Capillary 171 (H) 65 - 99 mg/dL  Glucose, capillary     Status: Abnormal   Collection Time: 12/20/17  9:26 PM  Result Value Ref Range   Glucose-Capillary 237 (H) 65 - 99 mg/dL  Basic metabolic panel     Status: Abnormal   Collection Time: 12/21/17  4:51 AM  Result Value Ref Range   Sodium 140 135 - 145 mmol/L   Potassium 2.7 (LL) 3.5 - 5.1 mmol/L     Comment: CRITICAL RESULT CALLED TO, READ BACK BY AND VERIFIED WITH: I P TEREN,RN @0541 12/21/17 MKELLY    Chloride 102 101 - 111 mmol/L   CO2 24 22 - 32 mmol/L   Glucose, Bld 183 (H) 65 -   99 mg/dL   BUN 21 (H) 6 - 20 mg/dL   Creatinine, Ser 0.85 0.61 - 1.24 mg/dL   Calcium 8.3 (L) 8.9 - 10.3 mg/dL   GFR calc non Af Amer >60 >60 mL/min   GFR calc Af Amer >60 >60 mL/min    Comment: (NOTE) The eGFR has been calculated using the CKD EPI equation. This calculation has not been validated in all clinical situations. eGFR's persistently <60 mL/min signify possible Chronic Kidney Disease.    Anion gap 14 5 - 15    Comment: Performed at [facility], [address] [place], [place] [address]  CBC with Differential/Platelet     Status: Abnormal   Collection Time: 12/21/17  4:51 AM  Result Value Ref Range   WBC 18.2 (H) 4.0 - 10.5 K/uL   RBC 3.37 (L) 4.22 - 5.81 MIL/uL   Hemoglobin 9.9 (L) 13.0 - 17.0 g/dL   HCT 29.5 (L) 39.0 - 52.0 %   MCV 87.5 78.0 - 100.0 fL   MCH 29.4 26.0 - 34.0 pg   MCHC 33.6 30.0 - 36.0 g/dL   RDW 15.0 11.5 - 15.5 %   Platelets 347 150 - 400 K/uL   Neutrophils Relative % 79 %   Neutro Abs 14.3 (H) 1.7 - 7.7 K/uL   Lymphocytes Relative 14 %   Lymphs Abs 2.5 0.7 - 4.0 K/uL   Monocytes Relative 5 %   Monocytes Absolute 1.0 0.1 - 1.0 K/uL   Eosinophils Relative 2 %   Eosinophils Absolute 0.4 0.0 - 0.7 K/uL   Basophils Relative 0 %   Basophils Absolute 0.0 0.0 - 0.1 K/uL    Comment: Performed at [facility], [address] [place], [place] [address]  Glucose, capillary     Status: Abnormal   Collection Time: 12/21/17  7:32 AM  Result Value Ref Range   Glucose-Capillary 188 (H) 65 - 99 mg/dL  Glucose, capillary     Status: Abnormal   Collection Time: 12/21/17 11:54 AM  Result Value Ref Range   Glucose-Capillary 262 (H) 65 - 99 mg/dL    Dg Chest Port 1 View  Result Date: 12/21/2017 CLINICAL DATA:  Acute respiratory  failure with hypoxemia EXAM: PORTABLE CHEST 1 VIEW COMPARISON:  Yesterday FINDINGS: Low volume chest with indistinct reticular opacities at the bases. Normal heart size. Aortic tortuosity. No visible effusion or pneumothorax. IMPRESSION: Mild lower lung reticulation is stable from yesterday. There has been significant improvement in aeration since admission study 12/13/2017. Electronically Signed   By: Jonathon  Watts M.D.   On: 12/21/2017 08:02   Dg Chest Port 1 View  Result Date: 12/20/2017 CLINICAL DATA:  Respiratory failure EXAM: PORTABLE CHEST 1 VIEW COMPARISON:  12/19/2017 FINDINGS: Cardiac shadow is stable. Right jugular central line is noted. Endotracheal tube and nasogastric catheter have been removed in the interval. Postsurgical changes in the cervical spine are noted. Lungs are well aerated bilaterally. Mild vascular congestion is again identified stable from the prior exam. IMPRESSION: No significant interval change from the prior exam. Electronically Signed   By: Mark  Lukens M.D.   On: 12/20/2017 07:47    ROS Blood pressure 139/76, pulse 77, temperature 98 F (36.7 C), temperature source Oral, resp. rate 15, height 5' 8" (1.727 m), weight 89.4 kg (197 lb 1.5 oz), SpO2 91 %. Physical Exam  Constitutional: No distress.  Thin elderly male lying in bed in NAD. Pain with cleaning of scrotal/perineal areas which are heavily soiled with   fecal matter.   HENT:  Head: Normocephalic and atraumatic.  Eyes: Pupils are equal, round, and reactive to light. EOM are normal.  Cardiovascular: Normal rate and regular rhythm.  Respiratory: Effort normal. No respiratory distress.  GI: Soft. He exhibits no distension.  Genitourinary:  Genitourinary Comments: S/P scrotectomy- testes exposed to tunica vaginalis layer. Wound into perineum, but no peri anal involvement. Slight involvement of the SP area and at the base of the penis. After cleaning the area which was heavily soiled with stool (leaking around  Flexiseal) the wound looks grossly clean and without much residual slough. Foley draining clear yellow urine  Neurological: He is alert.    Assessment/Plan: POD #7 following total scrotectomy for Fournier's gangrene- Plan for OR next week for irrigation and debridement and surgical prep of the residual wound with placement of Acell and possible placement of testes in subcutaneous thigh pockets and possible local scrotal advancement flaps.  Will plan for OR possibly next Monday pending OR schedule.   Also: Would consider checking C diff if patient continues with loose stools.  Advance diet as able and add protein supplement if okay with primary service at this point to facilitate wound healing. Also add multi-vitamin, vitamin C and Zinc if okay with primary service.   RAYBURN,SHAWN,PA-C Plastic Surgery [phone]     

## 2017-12-21 NOTE — Progress Notes (Signed)
CRITICAL VALUE ALERT  Critical Value:  2.7  Date & Time Notied: 12/21/2017, @0545   Provider Notified: Dr Antionette Charpyd  Orders Received/Actions taken: Received orders

## 2017-12-21 NOTE — Progress Notes (Signed)
PROGRESS NOTE    Patrick Aguilar  ZOX:[id number] DOB: [birth year] DOA: 12/13/2017 PCP: Kirstie Peri, MD    Brief Narrative:   67 year old with past medical history relevant for hypertension, benign prostatic hypertrophy, untreated type 2 diabetes, COPD, neuropathy admitted from any pain with Fournier's gangrene status post scrotal debridement/total scrotectomy on 12/14/2017 with course complicated by sepsis with ARDS and a KI status post extubation on 12/19/2017.   Assessment & Plan:   Active Problems:   Sepsis (HCC)   #) Septic shock complicated by ARDS and AK I: Resolved, patient extubated on 12/18/2017.  #) Fournier's gangrene: Status post debridement on 12/14/2017. -Blood cultures 12/13/2017 -Surgical culture from 12/13/2017 shows normal skin flora including moderate enterococcus prevotii, moderate anaerobic GNR, beta-lactamase positive -Vancomycin, Zosyn, clindamycin continued from 12/13/2017 to 14 2019, currently off -Per plastic surgery plans to take to the OR next week for further debridement and evaluation -We will discuss with ICU nurses as patient has problems with stooling around the Flexi-Seal -We will send off C. difficile -Plastic surgery would like to add protein supplements, multivitamin, vitamin C, zinc which would be reasonable presently for wound healing  #) Hypertension: We will consider restarting tomorrow - Hold amlodipine 5 mg daily -Hold atenolol 50 mg daily -Hold chlorthalidone 25 mg daily -Hold benazepril 20 mg daily -PRN hydralazine  #) Type 2 diabetes: Patient was diagnosed during this hospitalization with type 2 diabetes.  His A1c is 7.4.  Likely he can be discharged home on oral hypoglycemics -Sliding scale insulin, before meals at bedtime  #) Neuropathy: -Continue gabapentin 800 mg 4 times daily  #) COPD: No PFTs in system, unknown Gold stage - PRN bronchodilators  Fluids: Tolerating p.o. Electrolytes: Monitor and supplement Nutrition: Regular diet,  supplements per above  Prophylaxis: SCDs  Disposition: Pending improved feeling wound healing, likely will need SNIF based on unusual location of wound  Full code    Consultants:   Plastic surgery  Urology  PC CM  Procedures: (Don't include imaging studies which can be auto populated. Include things that cannot be auto populated i.e. Echo, Carotid and venous dopplers, Foley, Bipap, HD, tubes/drains, wound vac, central lines etc) 12/14/2017 echo: - Left ventricle: The cavity size was normal. Wall thickness was   normal. The estimated ejection fraction was 55%. Wall motion was   normal; there were no regional wall motion abnormalities.   Features are consistent with a pseudonormal left ventricular   filling pattern, with concomitant abnormal relaxation and   increased filling pressure (grade 2 diastolic dysfunction). - Aortic valve: There was no stenosis. - Mitral valve: Mildly calcified annulus. Mildly calcified leaflets   . There was no significant regurgitation. - Right ventricle: The cavity size was mildly dilated. Systolic   function was normal. - Tricuspid valve: Peak RV-RA gradient (S): 30 mm Hg. - Pulmonary arteries: PA peak pressure: 45 mm Hg (S). - Systemic veins: IVC measured 2.3 cm with < 50% respirophasic   variation, suggesting RA pressure 15 mmHg.    12/13/2017 excision of scrotum and debridement of penis    Intubated 12/14/2017 to 12/18/2017  Antimicrobials: (specify start and planned stop date. Auto populated tables are space occupying and do not give end dates)  IV Zosyn, clindamycin, vancomycin 12/13/2017 12/20/2017   Subjective: Patient reports he is doing somewhat better.  He continues to have stooling around the Flexi-Seal is difficult to make a seal however his pain is not particularly bad.  He denies any abdominal pain, nausea, vomiting, fevers, cough,  congestion.  Objective: Vitals:   12/20/17 1849 12/20/17 2129 12/21/17 0147 12/21/17 0549  BP: (!)  147/71 (!) 142/69 (!) 145/72 139/76  Pulse: 85 79 83 77  Resp: (!) 24 16 14 15   Temp: 98.3 F (36.8 C) 97.9 F (36.6 C) (!) 97.3 F (36.3 C) 98 F (36.7 C)  TempSrc: Oral Oral Oral Oral  SpO2: 93% 91% 91% 91%  Weight:    89.4 kg (197 lb 1.5 oz)  Height:        Intake/Output Summary (Last 24 hours) at 12/21/2017 1424 Last data filed at 12/21/2017 0952 Gross per 24 hour  Intake 900 ml  Output 3875 ml  Net -2975 ml   Filed Weights   12/20/17 0321 12/20/17 0530 12/21/17 0549  Weight: 84.9 kg (187 lb 2.7 oz) 84.9 kg (187 lb 2.7 oz) 89.4 kg (197 lb 1.5 oz)    Examination:  General exam: Appears calm and comfortable  Respiratory system: Clear to auscultation. Respiratory effort normal. Cardiovascular system: Regular rate and rhythm, no murmurs,  Gastrointestinal system: Soft, nondistended, no organomegaly, plus bowel sounds Central nervous system: Alert and oriented. No focal neurological deficits. Extremities: Trace lower extremity edema Skin: Perineum is dressed Psychiatry: Judgement and insight appear normal. Mood & affect appropriate.     Data Reviewed: I have personally reviewed following labs and imaging studies  CBC: Recent Labs  Lab 12/15/17 0431 12/16/17 0343 12/17/17 0510 12/18/17 0520 12/21/17 0451  WBC 14.3* 12.4* 20.4* 19.4* 18.2*  NEUTROABS  --   --   --   --  14.3*  HGB 9.3* 8.8* 9.5* 9.1* 9.9*  HCT 26.7* 26.0* 27.8* 27.6* 29.5*  MCV 87.8 87.5 88.8 90.8 87.5  PLT 224 244 280 293 347   Basic Metabolic Panel: Recent Labs  Lab 12/15/17 0431 12/15/17 1656  12/18/17 0520 12/19/17 0442 12/20/17 0449 12/20/17 1343 12/21/17 0451  NA 138  --    < > 148* 151* 147* 141 140  K 4.2  --    < > 3.2* 3.1* 2.2* 2.9* 2.7*  CL 105  --    < > 110 108 106 104 102  CO2 22  --    < > 26 30 29 26 24   GLUCOSE 168*  --    < > 251* 212* 123* 304* 183*  BUN 57*  --    < > 41* 39* 28* 26* 21*  CREATININE 2.51*  --    < > 1.28* 1.24 0.93 0.94 0.85  CALCIUM 7.8*  --     < > 8.2* 8.4* 8.4* 8.1* 8.3*  MG 2.1 2.2  --  1.9  --   --   --   --   PHOS 6.0* 5.2*  --   --   --   --   --   --    < > = values in this interval not displayed.   GFR: Estimated Creatinine Clearance: 92.9 mL/min (by C-G formula based on SCr of 0.85 mg/dL). Liver Function Tests: Recent Labs  Lab 12/15/17 0431 12/16/17 0343  AST 27 29  ALT 38 37  ALKPHOS 170* 216*  BILITOT 0.6 0.8  PROT 5.3* 5.3*  ALBUMIN 2.0* 2.0*   No results for input(s): LIPASE, AMYLASE in the last 168 hours. No results for input(s): AMMONIA in the last 168 hours. Coagulation Profile: No results for input(s): INR, PROTIME in the last 168 hours. Cardiac Enzymes: No results for input(s): CKTOTAL, CKMB, CKMBINDEX, TROPONINI in the last 168 hours. BNP (  last 3 results) No results for input(s): PROBNP in the last 8760 hours. HbA1C: No results for input(s): HGBA1C in the last 72 hours. CBG: Recent Labs  Lab 12/20/17 1208 12/20/17 1645 12/20/17 2126 12/21/17 0732 12/21/17 1154  GLUCAP 314* 171* 237* 188* 262*   Lipid Profile: Recent Labs    12/19/17 0442  TRIG 259*   Thyroid Function Tests: No results for input(s): TSH, T4TOTAL, FREET4, T3FREE, THYROIDAB in the last 72 hours. Anemia Panel: No results for input(s): VITAMINB12, FOLATE, FERRITIN, TIBC, IRON, RETICCTPCT in the last 72 hours. Sepsis Labs: Recent Labs  Lab 12/15/17 0431  PROCALCITON 9.02    Recent Results (from the past 240 hour(s))  Blood Culture (routine x 2)     Status: None   Collection Time: 12/13/17  1:59 PM  Result Value Ref Range Status   Specimen Description BLOOD RIGHT WRIST  Final   Special Requests   Final    BOTTLES DRAWN AEROBIC AND ANAEROBIC Blood Culture adequate volume   Culture   Final    NO GROWTH 5 DAYS Performed at [facility], [address] [place], [place] [address]    Report Status 12/18/2017 FINAL  Final  Blood Culture (routine x 2)     Status: None   Collection Time: 12/13/17  2:05 PM  Result  Value Ref Range Status   Specimen Description BLOOD RIGHT FOREARM  Final   Special Requests   Final    BOTTLES DRAWN AEROBIC AND ANAEROBIC Blood Culture adequate volume   Culture   Final    NO GROWTH 5 DAYS Performed at [facility], [address] [place], [place] [address]    Report Status 12/18/2017 FINAL  Final  Aerobic/Anaerobic Culture (surgical/deep wound)     Status: None   Collection Time: 12/13/17  9:01 PM  Result Value Ref Range Status   Specimen Description   Final    WOUND Performed at [facility], [address] [place], [place] [address]    Special Requests   Final    NONE Performed at [facility], [address] [place], [place] [address]    Gram Stain   Final    NO WBC SEEN ABUNDANT GRAM POSITIVE COCCI RARE GRAM POSITIVE RODS    Culture   Final    NORMAL SKIN FLORA MODERATE ANAEROCOCCUS PREVOTII MODERATE ANAEROBIC GRAM NEGATIVE ROD BETA LACTAMASE POSITIVE Performed at [facility], [address] [place], [place] [address]    Report Status 12/19/2017 FINAL  Final  Culture, blood (routine x 2)     Status: None   Collection Time: 12/13/17 11:04 PM  Result Value Ref Range Status   Specimen Description   Final    BLOOD RIGHT HAND Performed at [facility], [address] [place], [place] [address]    Special Requests   Final    BOTTLES DRAWN AEROBIC AND ANAEROBIC Blood Culture adequate volume Performed at [facility], [address] [place], [place] [address]    Culture   Final    NO GROWTH 5 DAYS Performed at [facility], [address] [place], [place] [address]    Report Status 12/19/2017 FINAL  Final  Culture, blood (routine x 2)     Status: None   Collection Time: 12/13/17 11:04 PM  Result Value Ref Range Status   Specimen Description   Final    BLOOD LEFT ANTECUBITAL Performed at [facility], [address] [place], [place]  [address]    Special Requests   Final    BOTTLES DRAWN AEROBIC AND ANAEROBIC Blood Culture adequate volume Performed at [facility], [address], [place], [place] [address]    Culture   Final    NO GROWTH 5 DAYS Performed at [facility], [address] [place], [place] [address]    Report Status 12/19/2017 FINAL  Final  MRSA PCR Screening     Status: None   Collection Time: 12/14/17  1:53 AM  Result Value Ref Range Status   MRSA by PCR NEGATIVE NEGATIVE Final    Comment:        The GeneXpert MRSA Assay (FDA approved for NASAL specimens only), is one component of a comprehensive MRSA colonization surveillance program. It is not intended to diagnose MRSA infection nor to guide or monitor treatment for MRSA infections. Performed at [facility], [address], [place], [place] [address]   Culture, respiratory (NON-Expectorated)     Status: None   Collection Time: 12/15/17  3:03 AM  Result Value Ref Range Status   Specimen Description   Final    TRACHEAL ASPIRATE Performed at [facility], [address], [place], [place] [address]    Special Requests   Final    NONE Performed at [facility], [address] [place], [place] [address]    Gram Stain   Final    ABUNDANT WBC PRESENT, PREDOMINANTLY PMN ABUNDANT YEAST Performed at [facility], [address] [place], [place] [address]    Culture ABUNDANT CANDIDA ALBICANS  Final   Report Status 12/17/2017 FINAL  Final         Radiology Studies: Dg Chest Port 1 View  Result Date: 12/21/2017 CLINICAL DATA:  Acute respiratory failure with hypoxemia EXAM: PORTABLE CHEST 1 VIEW COMPARISON:  Yesterday FINDINGS: Low volume chest with indistinct reticular opacities at the bases. Normal heart size. Aortic tortuosity. No visible effusion or pneumothorax. IMPRESSION: Mild lower lung reticulation is stable from yesterday. There has been  significant improvement in aeration since admission study 12/13/2017. Electronically Signed   By: Marnee Spring M.D.   On: 12/21/2017 08:02   Dg Chest Port 1 View  Result Date: 12/20/2017 CLINICAL DATA:  Respiratory failure EXAM: PORTABLE CHEST 1 VIEW COMPARISON:  12/19/2017 FINDINGS: Cardiac shadow is stable. Right jugular central line is noted. Endotracheal tube and nasogastric catheter have been removed in the interval. Postsurgical changes in the cervical spine are noted. Lungs are well aerated bilaterally. Mild vascular congestion is again identified stable from the prior exam. IMPRESSION: No significant interval change from the prior exam. Electronically Signed   By: Alcide Clever M.D.   On: 12/20/2017 07:47        Scheduled Meds: . chlorhexidine  15 mL Mouth Rinse BID  . Chlorhexidine Gluconate Cloth  6 each Topical Daily  . collagenase  1 application Topical BID  . famotidine  20 mg Oral BID  . gabapentin  800 mg Oral TID  . heparin  5,000 Units Subcutaneous Q8H  . insulin aspart  0-15 Units Subcutaneous TID WC  . insulin aspart  0-5 Units Subcutaneous QHS  . mouth rinse  15 mL Mouth Rinse q12n4p  . multivitamin with minerals  1 tablet Oral Daily  . potassium chloride  40 mEq Oral BID  . sodium chloride flush  10-40 mL Intracatheter Q12H   Continuous Infusions:   LOS: 8  days    Time spent: 35    Delaine Lame, MD [facility]  If 7PM-7AM, please contact night-coverage [email or web address] Password [facility] 12/21/2017, 2:24 PM

## 2017-12-21 NOTE — Care Management Important Message (Signed)
Important Message  Patient Details  Name: Patrick Aguilar MRN: [id number] Date of Birth: Jul 21, 1951   Medicare Important Message Given:  Yes    Caren MacadamFuller, Deandra Goering 12/21/2017, 10:13 AMImportant Message  Patient Details  Name: Patrick Aguilar MRN: [id number] Date of Birth: Jul 21, 1951   Medicare Important Message Given:  Yes    Caren MacadamFuller, Dafney Farler 12/21/2017, 10:13 AM

## 2017-12-22 LAB — GLUCOSE, CAPILLARY
GLUCOSE-CAPILLARY: 171 mg/dL — AB (ref 65–99)
GLUCOSE-CAPILLARY: 171 mg/dL — AB (ref 65–99)
Glucose-Capillary: 234 mg/dL — ABNORMAL HIGH (ref 65–99)
Glucose-Capillary: 154 mg/dL — ABNORMAL HIGH (ref 65–99)

## 2017-12-22 LAB — COMPREHENSIVE METABOLIC PANEL
ALT: 39 U/L (ref 17–63)
Albumin: 2.1 g/dL — ABNORMAL LOW (ref 3.5–5.0)
Alkaline Phosphatase: 136 U/L — ABNORMAL HIGH (ref 38–126)
Calcium: 8.2 mg/dL — ABNORMAL LOW (ref 8.9–10.3)
Creatinine, Ser: 0.93 mg/dL (ref 0.61–1.24)
GFR calc Af Amer: >60 mL/min (ref >60)
GFR calc non Af Amer: >60 mL/min (ref >60)
Potassium: 3.2 mmol/L — ABNORMAL LOW (ref 3.5–5.1)
Total Bilirubin: 0.8 mg/dL (ref 0.3–1.2)

## 2017-12-22 LAB — COMPREHENSIVE METABOLIC PANEL WITH GFR
AST: 21 U/L (ref 15–41)
Anion gap: 8 (ref 5–15)
BUN: 16 mg/dL (ref 6–20)
CO2: 23 mmol/L (ref 22–32)
Chloride: 105 mmol/L (ref 101–111)
Glucose, Bld: 178 mg/dL — ABNORMAL HIGH (ref 65–99)
Sodium: 136 mmol/L (ref 135–145)
Total Protein: 5.5 g/dL — ABNORMAL LOW (ref 6.5–8.1)

## 2017-12-22 LAB — CBC
HCT: 28 % — ABNORMAL LOW (ref 39.0–52.0)
Hemoglobin: 9.6 g/dL — ABNORMAL LOW (ref 13.0–17.0)
MCH: 29.7 pg (ref 26.0–34.0)
MCHC: 34.3 g/dL (ref 30.0–36.0)
MCV: 86.7 fL (ref 78.0–100.0)
Platelets: 322 10*3/uL (ref 150–400)
RBC: 3.23 MIL/uL — ABNORMAL LOW (ref 4.22–5.81)
RDW: 14.9 % (ref 11.5–15.5)
WBC: 17.7 10*3/uL — ABNORMAL HIGH (ref 4.0–10.5)

## 2017-12-22 LAB — MAGNESIUM: Magnesium: 1.9 mg/dL (ref 1.7–2.4)

## 2017-12-22 MED ORDER — CHLORTHALIDONE 25 MG PO TABS
25.0000 mg | ORAL_TABLET | Freq: Every day | ORAL | Status: DC
  Administered 2017-12-23 – 2017-12-27 (×3): 25 mg via ORAL
  Filled 2017-12-22 (×6): qty 1

## 2017-12-22 MED ORDER — GABAPENTIN 400 MG PO CAPS
800.0000 mg | ORAL_CAPSULE | Freq: Four times a day (QID) | ORAL | Status: DC
  Administered 2017-12-22 – 2017-12-28 (×22): 800 mg via ORAL
  Filled 2017-12-22 (×22): qty 2

## 2017-12-22 MED ORDER — AMLODIPINE BESYLATE 5 MG PO TABS
5.0000 mg | ORAL_TABLET | Freq: Every day | ORAL | Status: DC
  Administered 2017-12-23 – 2017-12-24 (×2): 5 mg via ORAL
  Filled 2017-12-22 (×3): qty 1

## 2017-12-22 MED ORDER — TRAZODONE HCL 100 MG PO TABS
100.0000 mg | ORAL_TABLET | Freq: Every day | ORAL | Status: DC
  Administered 2017-12-22 – 2017-12-27 (×6): 100 mg via ORAL
  Filled 2017-12-22 (×6): qty 1

## 2017-12-22 MED ORDER — ATENOLOL 50 MG PO TABS
50.0000 mg | ORAL_TABLET | Freq: Every day | ORAL | Status: DC
  Administered 2017-12-23 – 2017-12-27 (×4): 50 mg via ORAL
  Filled 2017-12-22 (×4): qty 1

## 2017-12-22 MED ORDER — POTASSIUM CHLORIDE CRYS ER 20 MEQ PO TBCR
40.0000 meq | EXTENDED_RELEASE_TABLET | ORAL | Status: AC
  Administered 2017-12-22 (×2): 40 meq via ORAL
  Filled 2017-12-22 (×2): qty 2

## 2017-12-22 MED ORDER — ATENOLOL-CHLORTHALIDONE 50-25 MG PO TABS: 1.0000 | ORAL_TABLET | Freq: Every day | ORAL | Status: DC

## 2017-12-22 MED ORDER — DOXAZOSIN MESYLATE 2 MG PO TABS
8.0000 mg | ORAL_TABLET | Freq: Every day | ORAL | Status: DC
  Administered 2017-12-23 – 2017-12-28 (×4): 8 mg via ORAL
  Filled 2017-12-22 (×6): qty 4

## 2017-12-22 MED ORDER — JUVEN PO PACK
1.0000 | PACK | Freq: Two times a day (BID) | ORAL | Status: DC
  Administered 2017-12-22 – 2017-12-28 (×8): 1 via ORAL
  Filled 2017-12-22 (×13): qty 1

## 2017-12-22 NOTE — Progress Notes (Signed)
PROGRESS NOTE    Patrick Aguilar  WUJ:[id number] DOB: 11-Mar-1951 DOA: 12/13/2017 PCP: [name], [name], MD    Brief Narrative:   [age] year old with past medical history relevant for hypertension, benign prostatic hypertrophy, untreated type 2 diabetes, COPD, neuropathy admitted from any pain with Fournier's gangrene status post scrotal debridement/total scrotectomy on 12/14/2017 with course complicated by sepsis with ARDS and a KI status post extubation on 12/19/2017.   Assessment & Plan:   Principal Problem:   Fournier gangrene Active Problems:   HTN (hypertension)   COPD (chronic obstructive pulmonary disease) (HCC)   #) Septic shock complicated by ARDS and AK I: Resolved, patient extubated on 12/18/2017.  #) Fournier's gangrene: Status post debridement on 12/14/2017. -Blood cultures 12/13/2017 -Surgical culture from 12/13/2017 shows normal skin flora including moderate enterococcus prevotii, moderate anaerobic GNR, beta-lactamase positive -Vancomycin, Zosyn, clindamycin continued from 12/13/2017 to 14 2019, currently off antibiotics -Per plastic surgery plans to take to the OR next week for further debridement and evaluation -We will discuss with ICU nurses as patient has problems with stooling around the Flexi-Seal -Continue protein supplements, multivitamin, vitamin C, zinc for wound healing  #) Hypertension: We will consider restarting tomorrow -Restart amlodipine 5 mg daily -Restart atenolol 50 mg daily -Restart chlorthalidone 25 mg daily -Hold benazepril 20 mg daily -PRN hydralazine  #) Type 2 diabetes: Patient was diagnosed during this hospitalization with type 2 diabetes.  His A1c is 7.4.  -Sliding scale insulin, before meals at bedtime  -start oral hypoglycemics on discharge  #) Neuropathy: -Continue gabapentin 800 mg 4 times daily  #) COPD: No PFTs in system, unknown Gold stage - PRN bronchodilators  Fluids: Tolerating p.o. Electrolytes: Monitor and supplement Nutrition:  Regular diet, supplements per above  Prophylaxis: SCDs  Disposition: Pending improved feeling wound healing, likely will need SNIF based on unusual location of wound  Full code    Consultants:   Plastic surgery  Urology  PC CM  Procedures: (Don't include imaging studies which can be auto populated. Include things that cannot be auto populated i.e. Echo, Carotid and venous dopplers, Foley, Bipap, HD, tubes/drains, wound vac, central lines etc) 12/14/2017 echo: - Left ventricle: The cavity size was normal. Wall thickness was   normal. The estimated ejection fraction was 55%. Wall motion was   normal; there were no regional wall motion abnormalities.   Features are consistent with a pseudonormal left ventricular   filling pattern, with concomitant abnormal relaxation and   increased filling pressure (grade 2 diastolic dysfunction). - Aortic valve: There was no stenosis. - Mitral valve: Mildly calcified annulus. Mildly calcified leaflets   . There was no significant regurgitation. - Right ventricle: The cavity size was mildly dilated. Systolic   function was normal. - Tricuspid valve: Peak RV-RA gradient (S): 30 mm Hg. - Pulmonary arteries: PA peak pressure: 45 mm Hg (S). - Systemic veins: IVC measured 2.3 cm with < 50% respirophasic   variation, suggesting RA pressure 15 mmHg.   12/13/2017 excision of scrotum and debridement of penis   Intubated 12/14/2017 to 12/18/2017  Antimicrobials: (specify start and planned stop date. Auto populated tables are space occupying and do not give end dates)  IV Zosyn, clindamycin, vancomycin 12/13/2017 12/20/2017   Subjective: Patient reports he is doing somewhat well.  He otherwise denies any nausea, vomiting, abdominal pain.  Reports his appetite is returned and his testicles are less tender.  Objective: Vitals:   12/21/17 2149 12/22/17 0138 12/22/17 0601 12/22/17 1346  BP: 134/69 (!) 122/55  139/78 139/78  Pulse: 82 77 70 68  Resp: 13 16  14 17   Temp: 98.2 F (36.8 C) 99.4 F (37.4 C) 97.7 F (36.5 C) 97.9 F (36.6 C)  TempSrc: Oral Oral Oral Oral  SpO2: 94% 95% 94% 96%  Weight:   89.4 kg (197 lb 1.5 oz)   Height:        Intake/Output Summary (Last 24 hours) at 12/22/2017 1459 Last data filed at 12/22/2017 1347 Gross per 24 hour  Intake 600 ml  Output 3100 ml  Net -2500 ml   Filed Weights   12/20/17 0530 12/21/17 0549 12/22/17 0601  Weight: 84.9 kg (187 lb 2.7 oz) 89.4 kg (197 lb 1.5 oz) 89.4 kg (197 lb 1.5 oz)    Examination:  General exam: Appears calm and comfortable  Respiratory system: Clear to auscultation. Respiratory effort normal. Cardiovascular system: Regular rate and rhythm, no murmurs,  Gastrointestinal system: Soft, nondistended, no organomegaly, plus bowel sounds Central nervous system: Alert and oriented. No focal neurological deficits. Extremities: Trace lower extremity edema Skin: Degloving of bilateral testes up to perineum, area is pink, moist, clean dry and intact Psychiatry: Judgement and insight appear normal. Mood & affect appropriate.     Data Reviewed: I have personally reviewed following labs and imaging studies  CBC: Recent Labs  Lab 12/16/17 0343 12/17/17 0510 12/18/17 0520 12/21/17 0451 12/22/17 0458  WBC 12.4* 20.4* 19.4* 18.2* 17.7*  NEUTROABS  --   --   --  14.3*  --   HGB 8.8* 9.5* 9.1* 9.9* 9.6*  HCT 26.0* 27.8* 27.6* 29.5* 28.0*  MCV 87.5 88.8 90.8 87.5 86.7  PLT 244 280 293 347 322   Basic Metabolic Panel: Recent Labs  Lab 12/15/17 1656  12/18/17 0520 12/19/17 0442 12/20/17 0449 12/20/17 1343 12/21/17 0451 12/22/17 0458  NA  --    < > 148* 151* 147* 141 140 136  K  --    < > 3.2* 3.1* 2.2* 2.9* 2.7* 3.2*  CL  --    < > 110 108 106 104 102 105  CO2  --    < > 26 30 29 26 24 23   GLUCOSE  --    < > 251* 212* 123* 304* 183* 178*  BUN  --    < > 41* 39* 28* 26* 21* 16  CREATININE  --    < > 1.28* 1.24 0.93 0.94 0.85 0.93  CALCIUM  --    < > 8.2* 8.4*  8.4* 8.1* 8.3* 8.2*  MG 2.2  --  1.9  --   --   --   --  1.9  PHOS 5.2*  --   --   --   --   --   --   --    < > = values in this interval not displayed.   GFR: Estimated Creatinine Clearance: 84.9 mL/min (by C-G formula based on SCr of 0.93 mg/dL). Liver Function Tests: Recent Labs  Lab 12/16/17 0343 12/22/17 0458  AST 29 21  ALT 37 39  ALKPHOS 216* 136*  BILITOT 0.8 0.8  PROT 5.3* 5.5*  ALBUMIN 2.0* 2.1*   No results for input(s): LIPASE, AMYLASE in the last 168 hours. No results for input(s): AMMONIA in the last 168 hours. Coagulation Profile: No results for input(s): INR, PROTIME in the last 168 hours. Cardiac Enzymes: No results for input(s): CKTOTAL, CKMB, CKMBINDEX, TROPONINI in the last 168 hours. BNP (last 3 results) No results for input(s): PROBNP in  the last 8760 hours. HbA1C: No results for input(s): HGBA1C in the last 72 hours. CBG: Recent Labs  Lab 12/21/17 1154 12/21/17 1645 12/21/17 2150 12/22/17 0741 12/22/17 1153  GLUCAP 262* 152* 173* 171* 234*   Lipid Profile: No results for input(s): CHOL, HDL, LDLCALC, TRIG, CHOLHDL, LDLDIRECT in the last 72 hours. Thyroid Function Tests: No results for input(s): TSH, T4TOTAL, FREET4, T3FREE, THYROIDAB in the last 72 hours. Anemia Panel: No results for input(s): VITAMINB12, FOLATE, FERRITIN, TIBC, IRON, RETICCTPCT in the last 72 hours. Sepsis Labs: No results for input(s): PROCALCITON, LATICACIDVEN in the last 168 hours.  Recent Results (from the past 240 hour(s))  Blood Culture (routine x 2)     Status: None   Collection Time: 12/13/17  1:59 PM  Result Value Ref Range Status   Specimen Description BLOOD RIGHT WRIST  Final   Special Requests   Final    BOTTLES DRAWN AEROBIC AND ANAEROBIC Blood Culture adequate volume   Culture   Final    NO GROWTH 5 DAYS Performed at [facility], [address] [place], [place] [address]    Report Status 12/18/2017 FINAL  Final  Blood Culture (routine x 2)     Status:  None   Collection Time: 12/13/17  2:05 PM  Result Value Ref Range Status   Specimen Description BLOOD RIGHT FOREARM  Final   Special Requests   Final    BOTTLES DRAWN AEROBIC AND ANAEROBIC Blood Culture adequate volume   Culture   Final    NO GROWTH 5 DAYS Performed at [facility], [address], [place], [place] [address]    Report Status 12/18/2017 FINAL  Final  Aerobic/Anaerobic Culture (surgical/deep wound)     Status: None   Collection Time: 12/13/17  9:01 PM  Result Value Ref Range Status   Specimen Description   Final    WOUND Performed at [facility], [address], [place], [place] [address]    Special Requests   Final    NONE Performed at [facility], [address] [place], [place] [address]    Gram Stain   Final    NO WBC SEEN ABUNDANT GRAM POSITIVE COCCI RARE GRAM POSITIVE RODS    Culture   Final    NORMAL SKIN FLORA MODERATE ANAEROCOCCUS PREVOTII MODERATE ANAEROBIC GRAM NEGATIVE ROD BETA LACTAMASE POSITIVE Performed at [facility], [address], [place], [place] [address]    Report Status 12/19/2017 FINAL  Final  Culture, blood (routine x 2)     Status: None   Collection Time: 12/13/17 11:04 PM  Result Value Ref Range Status   Specimen Description   Final    BLOOD RIGHT HAND Performed at [facility], [address], [place], [place] [address]    Special Requests   Final    BOTTLES DRAWN AEROBIC AND ANAEROBIC Blood Culture adequate volume Performed at [facility], [address] [place], [place] [address]    Culture   Final    NO GROWTH 5 DAYS Performed at [facility], [address], [place], [place] [address]    Report Status 12/19/2017 FINAL  Final  Culture, blood (routine x 2)     Status: None   Collection Time: 12/13/17 11:04 PM  Result Value Ref Range Status   Specimen Description   Final    BLOOD LEFT ANTECUBITAL Performed at [facility], [address], [place], [place] [address]  Special Requests   Final    BOTTLES DRAWN AEROBIC AND ANAEROBIC Blood Culture adequate volume Performed at [facility], [address] [place], [place] [address]    Culture   Final    NO GROWTH 5 DAYS Performed at [facility], [address] [place], [place] [address]    Report Status 12/19/2017 FINAL  Final  MRSA PCR Screening     Status: None   Collection Time: 12/14/17  1:53 AM  Result Value Ref Range Status   MRSA by PCR NEGATIVE NEGATIVE Final    Comment:        The GeneXpert MRSA Assay (FDA approved for NASAL specimens only), is one component of a comprehensive MRSA colonization surveillance program. It is not intended to diagnose MRSA infection nor to guide or monitor treatment for MRSA infections. Performed at [facility], [address], [place], [place] [address]   Culture, respiratory (NON-Expectorated)     Status: None   Collection Time: 12/15/17  3:03 AM  Result Value Ref Range Status   Specimen Description   Final    TRACHEAL ASPIRATE Performed at [facility], [address] [place], [place] [address]    Special Requests   Final    NONE Performed at [facility], [address], [place], [place] [address]    Gram Stain   Final    ABUNDANT WBC PRESENT, PREDOMINANTLY PMN ABUNDANT YEAST Performed at [facility], [address], [place], [place] [address]    Culture ABUNDANT CANDIDA ALBICANS  Final   Report Status 12/17/2017 FINAL  Final         Radiology Studies: Dg Chest Port 1 View  Result Date: 12/21/2017 CLINICAL DATA:  Acute respiratory failure with hypoxemia EXAM: PORTABLE CHEST 1 VIEW COMPARISON:  Yesterday FINDINGS: Low volume chest with indistinct reticular opacities at the bases. Normal heart size. Aortic tortuosity. No visible effusion or pneumothorax. IMPRESSION: Mild lower lung  reticulation is stable from yesterday. There has been significant improvement in aeration since admission study 12/13/2017. Electronically Signed   By: Marnee Spring M.D.   On: 12/21/2017 08:02        Scheduled Meds: . chlorhexidine  15 mL Mouth Rinse BID  . Chlorhexidine Gluconate Cloth  6 each Topical Daily  . collagenase  1 application Topical BID  . famotidine  20 mg Oral BID  . gabapentin  800 mg Oral TID  . heparin  5,000 Units Subcutaneous Q8H  . insulin aspart  0-15 Units Subcutaneous TID WC  . insulin aspart  0-5 Units Subcutaneous QHS  . mouth rinse  15 mL Mouth Rinse q12n4p  . multivitamin with minerals  1 tablet Oral Daily  . nutrition supplement (JUVEN)  1 packet Oral BID BM  . sodium chloride flush  10-40 mL Intracatheter Q12H   Continuous Infusions:   LOS: 9 days    Time spent: 35    Delaine Lame, MD [facility]  If 7PM-7AM, please contact night-coverage [email or web address] Password [facility] 12/22/2017, 2:59 PM
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
## 2017-12-22 NOTE — Evaluation (Addendum)
Occupational Therapy Evaluation- LATE ENTRY for 4/11 Patient Details Name: Patrick Aguilar MRN: [id number] DOB: 1951-04-21 Today's Date: 12/22/2017    History of Present Illness 67 y/o male with septic shock from fournier's gangrene complicated by ARDS, AKI.  He has COPD, GERD, HTN, anxiety.   Clinical Impression   Pt admitted with fourniers gangrene. Pt currently with functional limitations due to the deficits listed below (see OT Problem List). Pt will benefit from skilled OT to increase their safety and independence with ADL and functional mobility for ADL to facilitate discharge to venue listed below.      Follow Up Recommendations  ;SNF    Equipment Recommendations  3 in 1 bedside commode    Recommendations for Other Services       Precautions / Restrictions Precautions Precautions: Fall Precaution Comments: I and D scrotum      Mobility Bed Mobility               General bed mobility comments: Pt OOB  Transfers Overall transfer level: Needs assistance Equipment used: 2 person hand held assist Transfers: Sit to/from Stand;Stand Pivot Transfers Sit to Stand: Mod assist;+2 safety/equipment Stand pivot transfers: Mod assist;+2 safety/equipment       [facility]        ADL either performed or assessed with clinical judgement   ADL Overall ADL's : Needs assistance/impaired                     Lower Body Dressing: Total assistance;Sit to/from stand;Cueing for safety;Cueing for sequencing   Toilet Transfer: Moderate assistance;Maximal assistance;Stand-pivot;RW;BSC   Toileting- Clothing Manipulation and Hygiene: Total assistance;Sit to/from stand;Cueing for safety;Cueing for sequencing         General ADL Comments: obtained wide BSC for pt in order for pt to be able to sit more comfortable     Vision Baseline Vision/History: Wears glasses Patient Visual Report: No change from baseline        Extremity/Trunk  Assessment Upper Extremity Assessment Upper Extremity Assessment: Generalized weakness           Communication Communication Communication: No difficulties   Cognition Arousal/Alertness: Awake/alert Behavior During Therapy: WFL for tasks assessed/performed Overall Cognitive Status: Within Functional Limits for tasks assessed                                                Home Living Family/patient expects to be discharged to:: Private residence Living Arrangements: Alone Available Help at Discharge: Family Type of Home: House       Home Layout: Able to live on main level with bedroom/bathroom;Two level     Bathroom Shower/Tub: Chief Strategy OfficerTub/shower unit   Bathroom Toilet: Standard     Home Equipment: None          Prior Functioning/Environment Level of Independence: Independent                 OT Problem List: Decreased strength;Decreased activity tolerance;Impaired balance (sitting and/or standing);Pain      OT Treatment/Interventions: Self-care/ADL training;Patient/family education;DME and/or AE instruction    OT Goals(Current goals can be found in the care plan section) Acute Rehab OT Goals Patient Stated Goal: to walk, heal OT Goal Formulation: With patient Time For Goal Achievement: 12/29/17 Potential to Achieve Goals: Good  OT Frequency: Min 2X/week   Barriers to D/C:  AM-PAC PT "6 Clicks" Daily Activity     Outcome Measure Help from another person eating meals?: None Help from another person taking care of personal grooming?: A Little Help from another person toileting, which includes using toliet, bedpan, or urinal?: Total Help from another person bathing (including washing, rinsing, drying)?: A Lot   Help from another person to put on and taking off regular lower body clothing?: Total 6 Click Score: 11   End of Session Equipment Utilized During Treatment: Rolling walker Nurse Communication: Mobility  status  Activity Tolerance: Patient tolerated treatment well Patient left: Other (comment)(sitting on BSC)  OT Visit Diagnosis: Unsteadiness on feet (R26.81);Muscle weakness (generalized) (M62.81)                  Charges:    OT mod eval   Lise Auer, OT [phone]  Alba Cory 12/22/2017, 8:40 AM

## 2017-12-22 NOTE — Consult Note (Signed)
WOC Nurse wound consult note Reason for Consult:Follow up testicular and SP areas.  Slough has been effective debrided and patient will undergo plastic surgery next week.  WOC team will not follow.  Please reconsult if needed.   Maple HudsonKaren Zavien Clubb RN BSN CWON Pager [phone]

## 2017-12-22 NOTE — Progress Notes (Signed)
9 Days Post-Op Subjective: Patient reports feeling stronger.  No significant pain.  Objective: Vital signs in last 24 hours: Temp:  [97.5 F (36.4 C)-99.4 F (37.4 C)] 97.7 F (36.5 C) (04/12 0601) Pulse Rate:  [70-82] 70 (04/12 0601) Resp:  [13-18] 14 (04/12 0601) BP: (122-139)/(55-78) 139/78 (04/12 0601) SpO2:  [94 %-97 %] 94 % (04/12 0601) Weight:  [89.4 kg (197 lb 1.5 oz)] 89.4 kg (197 lb 1.5 oz) (04/12 0601)  Intake/Output from previous day: 04/11 0701 - 04/12 0700 In: 630 [P.O.:480] Out: 2600 [Urine:2600] Intake/Output this shift: Total I/O In: -  Out: 400 [Urine:400]  Physical Exam:  Constitutional: Vital signs reviewed. WD WN in NAD   Eyes: PERRL, No scleral icterus.   Cardiovascular: RRR Perineal exam: Patient lying in liquid feces.  This was cleaned.  The base of the wound looks good with minimal fibrinous material.  Cords/testicles clean, viable.  Lab Results: Recent Labs    12/21/17 0451 12/22/17 0458  HGB 9.9* 9.6*  HCT 29.5* 28.0*   BMET Recent Labs    12/21/17 0451 12/22/17 0458  NA 140 136  K 2.7* 3.2*  CL 102 105  CO2 24 23  GLUCOSE 183* 178*  BUN 21* 16  CREATININE 0.85 0.93  CALCIUM 8.3* 8.2*   No results for input(s): LABPT, INR in the last 72 hours. No results for input(s): LABURIN in the last 72 hours. Results for orders placed or performed during the hospital encounter of 12/13/17  Blood Culture (routine x 2)     Status: None   Collection Time: 12/13/17  1:59 PM  Result Value Ref Range Status   Specimen Description BLOOD RIGHT WRIST  Final   Special Requests   Final    BOTTLES DRAWN AEROBIC AND ANAEROBIC Blood Culture adequate volume   Culture   Final    NO GROWTH 5 DAYS Performed at [facility], [address] [place], [place] [address]    Report Status 12/18/2017 FINAL  Final  Blood Culture (routine x 2)     Status: None   Collection Time: 12/13/17  2:05 PM  Result Value Ref Range Status   Specimen Description BLOOD RIGHT  FOREARM  Final   Special Requests   Final    BOTTLES DRAWN AEROBIC AND ANAEROBIC Blood Culture adequate volume   Culture   Final    NO GROWTH 5 DAYS Performed at [facility], [address], [place], [place] [address]    Report Status 12/18/2017 FINAL  Final  Aerobic/Anaerobic Culture (surgical/deep wound)     Status: None   Collection Time: 12/13/17  9:01 PM  Result Value Ref Range Status   Specimen Description   Final    WOUND Performed at [facility], [address], [place], [place] [address]    Special Requests   Final    NONE Performed at [facility], [address], [place], [place] [address]    Gram Stain   Final    NO WBC SEEN ABUNDANT GRAM POSITIVE COCCI RARE GRAM POSITIVE RODS    Culture   Final    NORMAL SKIN FLORA MODERATE ANAEROCOCCUS PREVOTII MODERATE ANAEROBIC GRAM NEGATIVE ROD BETA LACTAMASE POSITIVE Performed at [facility], [address], [place], [place] [address]    Report Status 12/19/2017 FINAL  Final  Culture, blood (routine x 2)     Status: None   Collection Time: 12/13/17 11:04 PM  Result Value Ref Range Status   Specimen Description  Final    BLOOD RIGHT HAND Performed at [facility], [address] [place], [place] [address]    Special Requests   Final    BOTTLES DRAWN AEROBIC AND ANAEROBIC Blood Culture adequate volume Performed at [facility], [address] [place], [place] [address]    Culture   Final    NO GROWTH 5 DAYS Performed at [facility], [address] [place], [place] [address]    Report Status 12/19/2017 FINAL  Final  Culture, blood (routine x 2)     Status: None   Collection Time: 12/13/17 11:04 PM  Result Value Ref Range Status   Specimen Description   Final    BLOOD LEFT ANTECUBITAL Performed at [facility], [address] [place], [place] [address]    Special Requests   Final    BOTTLES DRAWN  AEROBIC AND ANAEROBIC Blood Culture adequate volume Performed at [facility], [address] [place], [place] [address]    Culture   Final    NO GROWTH 5 DAYS Performed at [facility], [address] [place], [place] [address]    Report Status 12/19/2017 FINAL  Final  MRSA PCR Screening     Status: None   Collection Time: 12/14/17  1:53 AM  Result Value Ref Range Status   MRSA by PCR NEGATIVE NEGATIVE Final    Comment:        The GeneXpert MRSA Assay (FDA approved for NASAL specimens only), is one component of a comprehensive MRSA colonization surveillance program. It is not intended to diagnose MRSA infection nor to guide or monitor treatment for MRSA infections. Performed at [facility], [address] [place], [place] [address]   Culture, respiratory (NON-Expectorated)     Status: None   Collection Time: 12/15/17  3:03 AM  Result Value Ref Range Status   Specimen Description   Final    TRACHEAL ASPIRATE Performed at [facility], [address] [place], [place] [address]    Special Requests   Final    NONE Performed at [facility], [address] [place], [place] [address]    Gram Stain   Final    ABUNDANT WBC PRESENT, PREDOMINANTLY PMN ABUNDANT YEAST Performed at [facility], [address] [place], [place] [address]    Culture ABUNDANT CANDIDA ALBICANS  Final   Report Status 12/17/2017 FINAL  Final    Studies/Results: Dg Chest Port 1 View  Result Date: 12/21/2017 CLINICAL DATA:  Acute respiratory failure with hypoxemia EXAM: PORTABLE CHEST 1 VIEW COMPARISON:  Yesterday FINDINGS: Low volume chest with indistinct reticular opacities at the bases. Normal heart size. Aortic tortuosity. No visible effusion or pneumothorax. IMPRESSION: Mild lower lung reticulation is stable from yesterday. There has been significant improvement in aeration since admission study 12/13/2017.  Electronically Signed   By: Marnee SpringJonathon  Watts M.D.   On: 12/21/2017 08:02    Assessment/Plan:   Postoperative day #9 excision of scrotum/Fournier's process.  He seems to be doing well.  Await reconstruction by plastic surgery.   LOS: 9 days   Chelsea AusStephen M Kashif Pooler 12/22/2017, 12:12 PM

## 2017-12-22 NOTE — Progress Notes (Signed)
Nutrition Follow-up  DOCUMENTATION CODES:   Obesity unspecified  INTERVENTION:   - Diabetes diet education  - 1 packet Juven BID, each packet provides 80 calories, 8 grams of carbohydrate, and 14 grams of amino acids; supplement contains CaHMB, glutamine, and arginine, to promote wound healing  NUTRITION DIAGNOSIS:   Inadequate oral intake related to inability to eat as evidenced by NPO status.  Progressing as pt is no longer NPO  GOAL:   Patient will meet greater than or equal to 90% of their needs  Unmet at this time  MONITOR:   Vent status, TF tolerance, Weight trends, Labs, Skin  REASON FOR ASSESSMENT:   Consult Diet education  ASSESSMENT:   67 yo male with hx of COPD, HTN, GERD, OA, and anxiety, He [facility] to [facility] on 4[facility]3 AM c/o SOB x 2 weeks and scrotal pain. He was found to have acute hypoxic respiratory failure and fournier's gangrene. He was transferred to [facility] where he underwent operative debridement. Postop he returned to the ICU, intubated on max vent settings with acute hypoxic respiratory failure and ARDS and in shock on vasopressors.   12/19/17 - extubated, TF stopped, clear liquid diet ordered 12/21/17 - pt and family met with Diabetes Coordinator  Spoke with pt and his daughter at bedside. Pt states he is very hungry and is wondering why he is on a clear liquid diet. Pt drank chicken broth and coffee for lunch and did not want to consume juice or jello because of concern about sugar. Pt is eager to transition to solid foods. Pt agreeable to receive Juven supplement to aid in wound healing.  RD noted pt's weight is down 10 lbs since admission. RD suspects some of weight loss related to fluid shifts as pt is -5.2 L since admission.  RD was consulted for nutrition education regarding diabetes and provided to pt and his daughter at follow up. Per pt, he usually eats 1 meal daily (dinner) and might have oatmeal in the morning but mainly  drinks black coffee throughout the day.  Lab Results  Component Value Date   HGBA1C 7.4 (H) 12/13/2017    RD provided "Carbohydrate Counting for People with Diabetes" and "Label Reading Tips for People with Diabetes" handouts from the Academy of Nutrition and Dietetics. Discussed different food groups and their effects on blood sugar, emphasizing carbohydrate-containing foods. Provided list of carbohydrates and recommended serving sizes of common foods.  Discussed importance of controlled and consistent carbohydrate intake throughout the day. Provided examples of ways to balance meals/snacks and encouraged intake of high-fiber, whole grain complex carbohydrates. Teach back method used.  Pt's daughter plans to help make grocery lists and meal plans for pt who lives with his nephew.  Expect good compliance.  Body mass index is 29.97 kg/m. Pt meets criteria for Overweight based on current BMI.  Medications reviewed and include: 20 mg Pepcid BID, sliding scale Novolog QID at meals and bedtime, MVI with minerals, 40 mEq potassium chloride BID today  Labs reviewed: potassium 3.2 (L), hemoglobin 9.6 (L), HCT 28 (L) CBG's: 234, 171, 173, 152 x 24 hours  Diet Order:  Diet clear liquid Room service appropriate? Yes; Fluid consistency: Thin  EDUCATION NEEDS:   No education needs have been identified at this time  Skin:  Skin Assessment: Skin Integrity Issues: Skin Integrity Issues:: Incisions Incisions: perineum from debridement on 4/3  Last BM:  12/21/17  Height:   Ht Readings from Last 1 Encounters:  12/13/17 5'  8" (1.727 m)    Weight:   Wt Readings from Last 1 Encounters:  12/22/17 197 lb 1.5 oz (89.4 kg)    Ideal Body Weight:  70 kg  BMI:  Body mass index is 29.97 kg/m.  Estimated Nutritional Needs:   Kcal:  2000-2200 kcal/day (MSJ x 1.2-1.3)  Protein:  70-85 grams/day (1.0-1.2 g/kg IBW)  Fluid:  >/= 1.8 L/day    Patrick Face, MS, RD, LDN Pager:  [phone] Weekend/After Hours: [phone]

## 2017-12-22 NOTE — Progress Notes (Signed)
Occupational Therapy Treatment Patient Details Name: [facility] MRN: [id number] DOB: July 03, 1951 Today's Date: 12/22/2017    History of present illness 67 y/o male with septic shock from fournier's gangrene complicated by ARDS, AKI.  He has COPD, GERD, HTN, anxiety.   OT comments  Pt with increased strength this day getting to [facility] and back to bed  Follow Up Recommendations  Home health OT;SNF    Equipment Recommendations  3 in 1 bedside commode    Recommendations for Other Services      Precautions / Restrictions Precautions Precautions: Fall Precaution Comments: I and D scrotum       Mobility Bed Mobility        sit to supine- mod A. Pt needed A with legs to get on bed       General bed mobility comments: Pt transitioning to [facility]  Transfers Overall transfer level: Needs assistance Equipment used: 2 person hand held assist Transfers: Sit to/from [employer] Sit to Stand: Mod assist Stand pivot transfers: Mod assist       General transfer comment: bed to [facility]        ADL either performed or assessed with clinical judgement   ADL Overall ADL's : Needs assistance/impaired     Grooming: Sitting;Minimal assistance               Lower Body Dressing: Total assistance;Sit to/from stand;Cueing for safety;Cueing for sequencing   Toilet Transfer: Moderate assistance;BSC;RW;Stand-pivot   Toileting- Clothing Manipulation and Hygiene: Maximal assistance;Sit to/from stand;Cueing for safety;Cueing for sequencing;Cueing for compensatory techniques         General ADL Comments: wide BSC worked well for pt     Vision Baseline Vision/History: Wears glasses Patient Visual Report: No change from baseline            Cognition Arousal/Alertness: Awake/alert Behavior During Therapy: WFL for tasks assessed/performed Overall Cognitive Status: Within Functional Limits for tasks assessed                                                      Pertinent Vitals/ Pain       Pain Score: 5  Pain Location: perineum when sitting down  Pain Descriptors / Indicators: Crushing;Discomfort;Grimacing;Guarding;Moaning Pain Intervention(s): Limited activity within patient's tolerance;Monitored during session  Home Living Family/patient expects to be discharged to:: Private residence Living Arrangements: Alone Available Help at Discharge: Family Type of Home: House       Home Layout: Able to live on main level with bedroom/bathroom;Two level     Bathroom Shower/Tub: Chief Strategy Officer: Standard     Home Equipment: None          Prior Functioning/Environment Level of Independence: Independent            Frequency  Min 2X/week        Progress Toward Goals  OT Goals(current goals can now be found in the care plan section)  Progress towards OT goals: Progressing toward goals  Acute Rehab OT Goals Patient Stated Goal: to walk, heal OT Goal Formulation: With patient Time For Goal Achievement: 12/29/17 Potential to Achieve Goals: Good ADL Goals Pt Will Perform Lower Body Bathing: with supervision;sit to/from stand;sitting/lateral leans Pt Will Transfer to Toilet: with min guard assist;regular height toilet;ambulating Pt Will Perform Toileting - Clothing Manipulation and hygiene: with  min assist;sit to/from stand  Plan      Co-evaluation                 AM-PAC PT "6 Clicks" Daily Activity     Outcome Measure   Help from another person eating meals?: None Help from another person taking care of personal grooming?: A Little Help from another person toileting, which includes using toliet, bedpan, or urinal?: A Lot Help from another person bathing (including washing, rinsing, drying)?: A Lot Help from another person to put on and taking off regular upper body clothing?: A Little Help from another person to put on and taking off regular lower body clothing?: Total 6 Click  Score: 15    End of Session Equipment Utilized During Treatment: Rolling walker  OT Visit Diagnosis: Unsteadiness on feet (R26.81);Muscle weakness (generalized) (M62.81)   Activity Tolerance Patient tolerated treatment well   Patient Left Other (comment)([facility])   Nurse Communication Mobility status        Time: [id number] OT Time Calculation (min): 25 min  Charges: OT General Charges $OT Visit: 1 Visit OT Treatments $Self Care/Home Management : 23-37 mins  [place]ori Demarion [place], [place] [phone]   Alba CoryREDDING, Kehinde Totzke D 12/22/2017, 10:29 AM

## 2017-12-22 NOTE — [place] FL2 (Signed)
[place] MEDICAID FL2 LEVEL OF CARE SCREENING TOOL     IDENTIFICATION  Patient Name: [facility] Birthdate: 05/23/1951 Sex: male Admission Date (Current Location): 12/13/2017  [facility] [place] and [place] Number:  Producer, television/film/video and Address:  [facility],  [address] [place] [address]      Provider Number: [id number]  Attending Physician Name and Address:  Delaine Lame, MD  Relative Name and Phone Number:       Current Level of Care: Hospital Recommended Level of Care: Skilled Nursing Facility Prior Approval Number:    Date Approved/Denied:   PASRR Number:    Discharge Plan: SNF    Current Diagnoses: Patient Active Problem List   Diagnosis Date Noted  . HTN (hypertension) 12/21/2017  . COPD (chronic obstructive pulmonary disease) (HCC) 12/21/2017  . Fournier gangrene   . Sialolithiasis of submandibular gland 03/12/2015    Orientation RESPIRATION BLADDER Height & Weight     Self, Time, Situation, Place  Normal Continent Weight: 197 lb 1.5 oz (89.4 kg) Height:  5\' 8"  (172.7 cm)  BEHAVIORAL SYMPTOMS/MOOD NEUROLOGICAL BOWEL NUTRITION STATUS      Continent Diet(Clear Liquid )  AMBULATORY STATUS COMMUNICATION OF NEEDS Skin   Extensive Assist Verbally Other (Comment)( Fournier's gangrene status post scrotal debridement/total scrotectomy on 12/14/2017 )- -Per plastic surgery plans to take to the OR next week for further debridement and evaluation  SEE D/C Summary for Wound care                       Personal Care Assistance Level of Assistance  Bathing, Feeding, Dressing Bathing Assistance: Limited assistance Feeding assistance: Independent Dressing Assistance: Limited assistance     Functional Limitations Info  Sight, Hearing, Speech Sight Info: Impaired(Wears glasses) Hearing Info: Adequate Speech Info: Adequate    SPECIAL CARE FACTORS FREQUENCY  PT (By licensed PT), OT (By licensed OT)     PT Frequency: 5x/week  OT Frequency:  5x/week             Contractures Contractures Info: Not present    Additional Factors Info  Allergies, Psychotropic, Insulin Sliding Scale Code Status Info: Fullcode Allergies Info: Allergies: Morphine And Related   Insulin Sliding Scale Info: 0-15units 3x's a day with meals/ 0-5units daily at bedtime.        Current Medications (12/22/2017):  This is the current hospital active medication list Current Facility-Administered Medications  Medication Dose Route Frequency Provider Last Rate Last Dose  . acetaminophen (TYLENOL) solution 650 mg  650 mg Per Tube Q6H PRN Coralyn Helling, MD   650 mg at 12/19/17 0945   Or  . acetaminophen (TYLENOL) suppository 650 mg  650 mg Rectal Q6H PRN Coralyn Helling, MD      . chlorhexidine (PERIDEX) 0.12 % solution 15 mL  15 mL Mouth Rinse BID Max Fickle B, MD   15 mL at 12/22/17 0815  . Chlorhexidine Gluconate Cloth 2 % PADS 6 each  6 each Topical Daily Marcine Matar, MD   6 each at 12/21/17 1146  . collagenase (SANTYL) ointment 1 application  1 application Topical BID Marcine Matar, MD   1 application at 12/22/17 1130  . famotidine (PEPCID) tablet 20 mg  20 mg Oral BID Danford Bad, RPH   20 mg at 12/22/17 0815  . fentaNYL (SUBLIMAZE) injection 12.5-25 mcg  12.5-25 mcg Intravenous Q2H PRN Lupita Leash, MD   25 mcg at 12/22/17 1048  . gabapentin (NEURONTIN) capsule  800 mg  800 mg Oral TID Max FickleMcQuaid, Douglas B, MD   800 mg at 12/22/17 [id number]  . heparin injection 5,000 Units  5,000 Units Subcutaneous Q8H Hammonds, Curt JewsKathleen H, MD   5,000 Units at 12/22/17 0502  . hydrALAZINE (APRESOLINE) injection 10 mg  10 mg Intravenous Q4H PRN Karl ItoSommer, Steven E, MD   10 mg at 12/20/17 0205  . insulin aspart (novoLOG) injection 0-15 Units  0-15 Units Subcutaneous TID WC Lupita LeashMcQuaid, Douglas B, MD   5 Units at 12/22/17 1222  . insulin aspart (novoLOG) injection 0-5 Units  0-5 Units Subcutaneous QHS Lupita LeashMcQuaid, Douglas B, MD   2 Units at 12/20/17 2200  .  ipratropium-albuterol (DUONEB) 0.5-2.5 (3) MG/3ML nebulizer solution 3 mL  3 mL Nebulization Q4H PRN Coralyn HellingSood, Vineet, MD      . MEDLINE mouth rinse  15 mL Mouth Rinse q12n4p Max FickleMcQuaid, Douglas B, MD   15 mL at 12/20/17 1816  . multivitamin with minerals tablet 1 tablet  1 tablet Oral Daily Danford BadWofford, Drew A, RPH   1 tablet at 12/22/17 0814  . ondansetron (ZOFRAN) injection 4 mg  4 mg Intravenous Q6H PRN Hammonds, Curt JewsKathleen H, MD      . sodium chloride flush (NS) 0.9 % injection 10-40 mL  10-40 mL Intracatheter Q12H Marcine Matarahlstedt, Stephen, MD   10 mL at 12/20/17 2300  . sodium chloride flush (NS) 0.9 % injection 10-40 mL  10-40 mL Intracatheter PRN Marcine Matarahlstedt, Stephen, MD         Discharge Medications: Please see discharge summary for a list of discharge medications.  Relevant Imaging Results:  Relevant Lab Results:   Additional Information ssn:[id number]  Clearance CootsNicole A Malka Bocek, LCSW

## 2017-12-23 ENCOUNTER — Ambulatory Visit: Payer: Self-pay | Admitting: Plastic Surgery

## 2017-12-23 DIAGNOSIS — N493 Fournier gangrene: Secondary | ICD-10-CM

## 2017-12-23 LAB — BASIC METABOLIC PANEL WITH GFR
BUN: 14 mg/dL (ref 6–20)
CO2: 20 mmol/L — ABNORMAL LOW (ref 22–32)
Calcium: 8.5 mg/dL — ABNORMAL LOW (ref 8.9–10.3)
Chloride: 107 mmol/L (ref 101–111)
Creatinine, Ser: 0.97 mg/dL (ref 0.61–1.24)
Sodium: 138 mmol/L (ref 135–145)

## 2017-12-23 LAB — IRON AND TIBC
Iron: 48 ug/dL (ref 45–182)
Saturation Ratios: 31 % (ref 17.9–39.5)
TIBC: 157 ug/dL — ABNORMAL LOW (ref 250–450)
UIBC: 109 ug/dL

## 2017-12-23 LAB — GLUCOSE, CAPILLARY
GLUCOSE-CAPILLARY: 169 mg/dL — AB (ref 65–99)
GLUCOSE-CAPILLARY: 108 mg/dL — AB (ref 65–99)
Glucose-Capillary: 170 mg/dL — ABNORMAL HIGH (ref 65–99)
Glucose-Capillary: 186 mg/dL — ABNORMAL HIGH (ref 65–99)

## 2017-12-23 LAB — CBC
HCT: 29.6 % — ABNORMAL LOW (ref 39.0–52.0)
Hemoglobin: 10 g/dL — ABNORMAL LOW (ref 13.0–17.0)
MCH: 29.9 pg (ref 26.0–34.0)
MCHC: 33.8 g/dL (ref 30.0–36.0)
MCV: 88.4 fL (ref 78.0–100.0)
Platelets: 370 10*3/uL (ref 150–400)
RBC: 3.35 MIL/uL — ABNORMAL LOW (ref 4.22–5.81)
RDW: 15.2 % (ref 11.5–15.5)
WBC: 19.8 10*3/uL — ABNORMAL HIGH (ref 4.0–10.5)

## 2017-12-23 LAB — FERRITIN: Ferritin: 411 ng/mL — ABNORMAL HIGH (ref 24–336)

## 2017-12-23 LAB — BASIC METABOLIC PANEL
Anion gap: 11 (ref 5–15)
GFR calc Af Amer: >60 mL/min (ref >60)
GFR calc non Af Amer: >60 mL/min (ref >60)
Glucose, Bld: 135 mg/dL — ABNORMAL HIGH (ref 65–99)
Potassium: 3.4 mmol/L — ABNORMAL LOW (ref 3.5–5.1)

## 2017-12-23 LAB — VITAMIN B12: Vitamin B-12: 1338 pg/mL — ABNORMAL HIGH (ref 180–914)

## 2017-12-23 MED ORDER — BENAZEPRIL HCL 10 MG PO TABS
20.0000 mg | ORAL_TABLET | Freq: Every day | ORAL | Status: DC
  Administered 2017-12-23 – 2017-12-27 (×3): 20 mg via ORAL
  Filled 2017-12-23 (×4): qty 2

## 2017-12-23 MED ORDER — POTASSIUM CHLORIDE CRYS ER 20 MEQ PO TBCR
40.0000 meq | EXTENDED_RELEASE_TABLET | Freq: Once | ORAL | Status: AC
  Administered 2017-12-23: 40 meq via ORAL
  Filled 2017-12-23: qty 2

## 2017-12-23 NOTE — Progress Notes (Signed)
PROGRESS NOTE    Tish FredericksonCharles W Herbst  AOZ:[id number] DOB: 03-Jun-1951 DOA: 12/13/2017 PCP: [name], [name], MD    Brief Narrative:   67 year old with past medical history relevant for hypertension, benign prostatic hypertrophy, untreated type 2 diabetes, COPD, neuropathy admitted from any pain with Fournier's gangrene status post scrotal debridement/total scrotectomy on 12/14/2017 with course complicated by sepsis with ARDS and a KI status post extubation on 12/19/2017.   Assessment & Plan:   Principal Problem:   Fournier gangrene Active Problems:   HTN (hypertension)   COPD (chronic obstructive pulmonary disease) (HCC)   #) Septic shock complicated by ARDS and AK I: Resolved, patient extubated on 12/18/2017.  #) Fournier's gangrene: Status post debridement on 12/14/2017. -Blood cultures 12/13/2017 -Surgical culture from 12/13/2017 shows normal skin flora including moderate enterococcus prevotii, moderate anaerobic GNR, beta-lactamase positive -Vancomycin, Zosyn, clindamycin continued from 12/13/2017 to 14 2019, currently off antibiotics -Per plastic surgery plans to take to the OR next week for further debridement and evaluation -Continue protein supplements, multivitamin, vitamin C, zinc for wound healing  #) Hypertension:  -Continue amlodipine 5 mg daily -Continue t atenolol 50 mg daily -Continue chlorthalidone 25 mg daily -Restart benazepril 20 mg daily -PRN hydralazine  #) Type 2 diabetes: Patient was diagnosed during this hospitalization with type 2 diabetes.  His A1c is 7.4.  -Sliding scale insulin, before meals at bedtime  -start oral hypoglycemics on discharge  #) Neuropathy: -Continue gabapentin 800 mg 4 times daily  #) COPD: No PFTs in system, unknown Gold stage - PRN bronchodilators  Fluids: Tolerating p.o. Electrolytes: Monitor and supplement Nutrition: Regular diet, supplements per above  Prophylaxis: SCDs  Disposition: Pending improved feeling wound healing, likely will  need SNF based on unusual location of wound  Full code    Consultants:   Plastic surgery  Urology  PC CM  Procedures: (Don't include imaging studies which can be auto populated. Include things that cannot be auto populated i.e. Echo, Carotid and venous dopplers, Foley, Bipap, HD, tubes/drains, wound vac, central lines etc) 12/14/2017 echo: - Left ventricle: The cavity size was normal. Wall thickness was   normal. The estimated ejection fraction was 55%. Wall motion was   normal; there were no regional wall motion abnormalities.   Features are consistent with a pseudonormal left ventricular   filling pattern, with concomitant abnormal relaxation and   increased filling pressure (grade 2 diastolic dysfunction). - Aortic valve: There was no stenosis. - Mitral valve: Mildly calcified annulus. Mildly calcified leaflets   . There was no significant regurgitation. - Right ventricle: The cavity size was mildly dilated. Systolic   function was normal. - Tricuspid valve: Peak RV-RA gradient (S): 30 mm Hg. - Pulmonary arteries: PA peak pressure: 45 mm Hg (S). - Systemic veins: IVC measured 2.3 cm with < 50% respirophasic   variation, suggesting RA pressure 15 mmHg.   12/13/2017 excision of scrotum and debridement of penis   Intubated 12/14/2017 to 12/18/2017  Antimicrobials: (specify start and planned stop date. Auto populated tables are space occupying and do not give end dates)  IV Zosyn, clindamycin, vancomycin 12/13/2017 12/20/2017   Subjective: Patient reports he is doing well.  He denies any nausea, vomiting, abdominal pain.  He is eager to go to the OR for surgery. Objective: Vitals:   12/23/17 0552 12/23/17 0620 12/23/17 1000 12/23/17 1105  BP: 129/79  127/85 127/85  Pulse: 83   86  Resp: 18     Temp: 98.2 F (36.8 C)  TempSrc: Oral     SpO2: 96%     Weight:  81.9 kg (180 lb 8.9 oz)    Height:        Intake/Output Summary (Last 24 hours) at 12/23/2017 1224 Last data  filed at 12/23/2017 0830 Gross per 24 hour  Intake 900 ml  Output 3375 ml  Net -2475 ml   Filed Weights   12/21/17 0549 12/22/17 0601 12/23/17 0620  Weight: 89.4 kg (197 lb 1.5 oz) 89.4 kg (197 lb 1.5 oz) 81.9 kg (180 lb 8.9 oz)    Examination:  General exam: Appears calm and comfortable  Respiratory system: Clear to auscultation. Respiratory effort normal. Cardiovascular system: Regular rate and rhythm, no murmurs,  Gastrointestinal system: Soft, nondistended, no organomegaly, plus bowel sounds Central nervous system: Alert and oriented. No focal neurological deficits. Extremities: Trace lower extremity edema Skin: Perineum is currently dressed Psychiatry: Judgement and insight appear normal. Mood & affect appropriate.     Data Reviewed: I have personally reviewed following labs and imaging studies  CBC: Recent Labs  Lab 12/17/17 0510 12/18/17 0520 12/21/17 0451 12/22/17 0458 12/23/17 0409  WBC 20.4* 19.4* 18.2* 17.7* 19.8*  NEUTROABS  --   --  14.3*  --   --   HGB 9.5* 9.1* 9.9* 9.6* 10.0*  HCT 27.8* 27.6* 29.5* 28.0* 29.6*  MCV 88.8 90.8 87.5 86.7 88.4  PLT 280 293 347 322 370   Basic Metabolic Panel: Recent Labs  Lab 12/18/17 0520  12/20/17 0449 12/20/17 1343 12/21/17 0451 12/22/17 0458 12/23/17 0409  NA 148*   < > 147* 141 140 136 138  K 3.2*   < > 2.2* 2.9* 2.7* 3.2* 3.4*  CL 110   < > 106 104 102 105 107  CO2 26   < > 29 26 24 23  20*  GLUCOSE 251*   < > 123* 304* 183* 178* 135*  BUN 41*   < > 28* 26* 21* 16 14  CREATININE 1.28*   < > 0.93 0.94 0.85 0.93 0.97  CALCIUM 8.2*   < > 8.4* 8.1* 8.3* 8.2* 8.5*  MG 1.9  --   --   --   --  1.9  --    < > = values in this interval not displayed.   GFR: Estimated Creatinine Clearance: 72.5 mL/min (by C-G formula based on SCr of 0.97 mg/dL). Liver Function Tests: Recent Labs  Lab 12/22/17 0458  AST 21  ALT 39  ALKPHOS 136*  BILITOT 0.8  PROT 5.5*  ALBUMIN 2.1*   No results for input(s): LIPASE,  AMYLASE in the last 168 hours. No results for input(s): AMMONIA in the last 168 hours. Coagulation Profile: No results for input(s): INR, PROTIME in the last 168 hours. Cardiac Enzymes: No results for input(s): CKTOTAL, CKMB, CKMBINDEX, TROPONINI in the last 168 hours. BNP (last 3 results) No results for input(s): PROBNP in the last 8760 hours. HbA1C: No results for input(s): HGBA1C in the last 72 hours. CBG: Recent Labs  Lab 12/22/17 1153 12/22/17 1738 12/22/17 2211 12/23/17 0806 12/23/17 1140  GLUCAP 234* 154* 171* 169* 108*   Lipid Profile: No results for input(s): CHOL, HDL, LDLCALC, TRIG, CHOLHDL, LDLDIRECT in the last 72 hours. Thyroid Function Tests: No results for input(s): TSH, T4TOTAL, FREET4, T3FREE, THYROIDAB in the last 72 hours. Anemia Panel: Recent Labs    12/23/17 0409  VITAMINB12 1,338*  FERRITIN 411*  TIBC 157*  IRON 48   Sepsis Labs: No results for input(s): PROCALCITON,  LATICACIDVEN in the last 168 hours.  Recent Results (from the past 240 hour(s))  Blood Culture (routine x 2)     Status: None   Collection Time: 12/13/17  1:59 PM  Result Value Ref Range Status   Specimen Description BLOOD RIGHT WRIST  Final   Special Requests   Final    BOTTLES DRAWN AEROBIC AND ANAEROBIC Blood Culture adequate volume   Culture   Final    NO GROWTH 5 DAYS Performed at [facility], [address], [place], [place] [address]    Report Status 12/18/2017 FINAL  Final  Blood Culture (routine x 2)     Status: None   Collection Time: 12/13/17  2:05 PM  Result Value Ref Range Status   Specimen Description BLOOD RIGHT FOREARM  Final   Special Requests   Final    BOTTLES DRAWN AEROBIC AND ANAEROBIC Blood Culture adequate volume   Culture   Final    NO GROWTH 5 DAYS Performed at [facility], [address] [place], [place] [address]    Report Status 12/18/2017 FINAL  Final  Aerobic/Anaerobic Culture (surgical/deep wound)     Status: None   Collection Time:  12/13/17  9:01 PM  Result Value Ref Range Status   Specimen Description   Final    WOUND Performed at [facility], [address] [place], [place] [address]    Special Requests   Final    NONE Performed at [facility], [address] [place], [place] [address]    Gram Stain   Final    NO WBC SEEN ABUNDANT GRAM POSITIVE COCCI RARE GRAM POSITIVE RODS    Culture   Final    NORMAL SKIN FLORA MODERATE ANAEROCOCCUS PREVOTII MODERATE ANAEROBIC GRAM NEGATIVE ROD BETA LACTAMASE POSITIVE Performed at [facility], [address] [place], [place] [address]    Report Status 12/19/2017 FINAL  Final  Culture, blood (routine x 2)     Status: None   Collection Time: 12/13/17 11:04 PM  Result Value Ref Range Status   Specimen Description   Final    BLOOD RIGHT HAND Performed at [facility], [address], [place], [place] [address]    Special Requests   Final    BOTTLES DRAWN AEROBIC AND ANAEROBIC Blood Culture adequate volume Performed at [facility], [address], [place], [place] [address]    Culture   Final    NO GROWTH 5 DAYS Performed at [facility], [address], [place], [place] [address]    Report Status 12/19/2017 FINAL  Final  Culture, blood (routine x 2)     Status: None   Collection Time: 12/13/17 11:04 PM  Result Value Ref Range Status   Specimen Description   Final    BLOOD LEFT ANTECUBITAL Performed at [facility], [address] [place], [place] [address]    Special Requests   Final    BOTTLES DRAWN AEROBIC AND ANAEROBIC Blood Culture adequate volume Performed at [facility], [address], [place], [place] [address]    Culture   Final    NO GROWTH 5 DAYS Performed at [facility], [address] [place], [place] [address]    Report Status 12/19/2017 FINAL  Final  MRSA PCR Screening     Status: None   Collection  Time: 12/14/17  1:53 AM  Result Value Ref Range Status   MRSA by  PCR NEGATIVE NEGATIVE Final    Comment:        The GeneXpert MRSA Assay (FDA approved for NASAL specimens only), is one component of a comprehensive MRSA colonization surveillance program. It is not intended to diagnose MRSA infection nor to guide or monitor treatment for MRSA infections. Performed at [facility], [address] [place], [place] [address]   Culture, respiratory (NON-Expectorated)     Status: None   Collection Time: 12/15/17  3:03 AM  Result Value Ref Range Status   Specimen Description   Final    TRACHEAL ASPIRATE Performed at [facility], [address], [place], [place] [address]    Special Requests   Final    NONE Performed at [facility], [address] [place], [place] [address]    Gram Stain   Final    ABUNDANT WBC PRESENT, PREDOMINANTLY PMN ABUNDANT YEAST Performed at [facility], [address] [place], [place] [address]    Culture ABUNDANT CANDIDA ALBICANS  Final   Report Status 12/17/2017 FINAL  Final         Radiology Studies: No results found.      Scheduled Meds: . amLODipine  5 mg Oral Daily  . atenolol  50 mg Oral Daily   And  . chlorthalidone  25 mg Oral Daily  . chlorhexidine  15 mL Mouth Rinse BID  . Chlorhexidine Gluconate Cloth  6 each Topical Daily  . collagenase  1 application Topical BID  . doxazosin  8 mg Oral Daily  . famotidine  20 mg Oral BID  . gabapentin  800 mg Oral QID  . heparin  5,000 Units Subcutaneous Q8H  . insulin aspart  0-15 Units Subcutaneous TID WC  . insulin aspart  0-5 Units Subcutaneous QHS  . mouth rinse  15 mL Mouth Rinse q12n4p  . multivitamin with minerals  1 tablet Oral Daily  . nutrition supplement (JUVEN)  1 packet Oral BID BM  . sodium chloride flush  10-40 mL Intracatheter Q12H  . traZODone  100 mg Oral QHS   Continuous Infusions:   LOS: 10 days     Time spent: 35    Delaine Lame, MD [facility]  If 7PM-7AM, please contact night-coverage [email or web address] Password [email or web address] 12/23/2017, 12:24 PM

## 2017-12-24 LAB — GLUCOSE, CAPILLARY
GLUCOSE-CAPILLARY: 264 mg/dL — AB (ref 65–99)
GLUCOSE-CAPILLARY: 231 mg/dL — AB (ref 65–99)
GLUCOSE-CAPILLARY: 193 mg/dL — AB (ref 65–99)
Glucose-Capillary: 198 mg/dL — ABNORMAL HIGH (ref 65–99)

## 2017-12-24 LAB — BASIC METABOLIC PANEL
Anion gap: 10 (ref 5–15)
BUN: 19 mg/dL (ref 6–20)
CO2: 20 mmol/L — ABNORMAL LOW (ref 22–32)
Chloride: 105 mmol/L (ref 101–111)
GFR calc Af Amer: >60 mL/min (ref >60)
Glucose, Bld: 179 mg/dL — ABNORMAL HIGH (ref 65–99)
Potassium: 4.1 mmol/L (ref 3.5–5.1)
Sodium: 135 mmol/L (ref 135–145)

## 2017-12-24 LAB — BASIC METABOLIC PANEL WITH GFR
Calcium: 8.4 mg/dL — ABNORMAL LOW (ref 8.9–10.3)
Creatinine, Ser: 1.13 mg/dL (ref 0.61–1.24)
GFR calc non Af Amer: >60 mL/min (ref >60)

## 2017-12-24 MED ORDER — ENOXAPARIN SODIUM 40 MG/0.4ML ~~LOC~~ SOLN
40.0000 mg | SUBCUTANEOUS | Status: DC
  Administered 2017-12-24 – 2017-12-27 (×4): 40 mg via SUBCUTANEOUS
  Filled 2017-12-24 (×5): qty 0.4

## 2017-12-24 MED ORDER — CHLORHEXIDINE GLUCONATE CLOTH 2 % EX PADS: 6.0000 | MEDICATED_PAD | Freq: Once | CUTANEOUS | Status: DC

## 2017-12-24 NOTE — Progress Notes (Signed)
Occupational Therapy Treatment Patient Details Name: Patrick Aguilar MRN: [id number] DOB: 12/03/1950 Today's Date: 12/24/2017    History of present illness 67 y/o male with septic shock from fournier's gangrene complicated by ARDS, AKI.  He has COPD, GERD, HTN, anxiety.   OT comments  Pt in much better spirits this day  Follow Up Recommendations  Home health OT;SNF;Other (comment)(DEPENDING ON PROGRESS)    Equipment Recommendations  3 in 1 bedside commode    Recommendations for Other Services      Precautions / Restrictions Precautions Precautions: Fall Precaution Comments: I and Aguilar scrotum       Mobility Bed Mobility Overal bed mobility: Needs Assistance Bed Mobility: Sit to Supine       Sit to supine: Min assist      Transfers Overall transfer level: Needs assistance   Transfers: Sit to/from Stand;Stand Pivot Transfers Sit to Stand: Min assist;Mod assist Stand pivot transfers: Min assist                ADL either performed or assessed with clinical judgement   ADL Overall ADL's : Needs assistance/impaired     Grooming: Minimal assistance;Standing       Lower Body Bathing: Maximal assistance;Sit to/from stand           Toilet Transfer: Minimal assistance;RW;BSC;Stand-pivot   Toileting- Clothing Manipulation and Hygiene: Maximal assistance;Sit to/from stand         General ADL Comments: pt in good spirits this day.  Pt very agreeable to working with OT               Cognition Arousal/Alertness: Awake/alert Behavior During Therapy: WFL for tasks assessed/performed Overall Cognitive Status: Within Functional Limits for tasks assessed                                                     Pertinent Vitals/ Pain       Pain Score: 5  Pain Location: scrotum Pain Descriptors / Indicators: Sore;Tender Pain Intervention(s): Limited activity within patient's tolerance;Monitored during session;Repositioned     Prior  Functioning/Environment              Frequency  Min 2X/week        Progress Toward Goals  OT Goals(current goals can now be found in the care plan section)  Progress towards OT goals: Progressing toward goals     Plan Discharge plan remains appropriate    Co-evaluation                 AM-PAC PT "6 Clicks" Daily Activity     Outcome Measure   Help from another person eating meals?: None Help from another person taking care of personal grooming?: A Little Help from another person toileting, which includes using toliet, bedpan, or urinal?: A Lot Help from another person bathing (including washing, rinsing, drying)?: A Lot Help from another person to put on and taking off regular upper body clothing?: A Little Help from another person to put on and taking off regular lower body clothing?: Total 6 Click Score: 15    End of Session Equipment Utilized During Treatment: Rolling walker  OT Visit Diagnosis: Unsteadiness on feet (R26.81);Muscle weakness (generalized) (M62.81)   Activity Tolerance Patient tolerated treatment well   Patient Left Other (comment)(sitting on [facility])   Nurse Communication Mobility status  Time: 1450-1505 OT Time Calculation (min): 15 min  Charges: OT General Charges $OT Visit: 1 Visit OT Treatments $Self Care/Home Management : 8-22 mins  Patrick Aguilar, [place] [phone]   Patrick CoryREDDING, Patrick Aguilar 12/24/2017, 7:00 PM

## 2017-12-24 NOTE — Progress Notes (Signed)
PROGRESS NOTE    Patrick Aguilar  [name] DOB: 1950/09/21 DOA: 12/13/2017 PCP: Kirstie Peri, MD    Brief Narrative:   67 year old with past medical history relevant for hypertension, benign prostatic hypertrophy, untreated type 2 diabetes, COPD, neuropathy admitted from any pain with Fournier's gangrene status post scrotal debridement/total scrotectomy on 12/14/2017 with course complicated by sepsis with ARDS and a KI status post extubation on 12/19/2017.   Assessment & Plan:   Principal Problem:   Fournier gangrene Active Problems:   HTN (hypertension)   COPD (chronic obstructive pulmonary disease) (HCC)   #) Septic shock complicated by ARDS and AK I: Resolved, patient extubated on 12/18/2017.  #) Fournier's gangrene: Status post debridement on 12/14/2017. Surgical culture from 12/13/2017 shows normal skin flora including moderate enterococcus prevotii, moderate anaerobic GNR, beta-lactamase positive.  Patient was on vancomycin, Zosyn, clindamycin continued from 12/13/2017 to 14 2019. -Hold antibiotics -Per plastic surgery plan to take patient to the OR on 12/25/2017 -Continue protein supplements, multivitamin, vitamin C, zinc for wound healing  #) Hypertension:  -Continue amlodipine 5 mg daily -Continue t atenolol 50 mg daily -Continue chlorthalidone 25 mg daily -Continue benazepril 20 mg daily -PRN hydralazine  #) Type 2 diabetes: Patient was diagnosed during this hospitalization with type 2 diabetes.  His A1c is 7.4.  -Sliding scale insulin, before meals at bedtime  -start oral hypoglycemics on discharge  #) Neuropathy: -Continue gabapentin 800 mg 4 times daily  #) COPD: No PFTs in system, unknown Gold stage - PRN bronchodilators  Fluids: Tolerating p.o. Electrolytes: Monitor and supplement Nutrition: Regular diet, supplements per above  Prophylaxis: SCDs  Disposition: Pending improved feeling wound healing, likely will need SNF based on unusual location of  wound  Full code    Consultants:   Plastic surgery  Urology  PC CM  Procedures: (Don't include imaging studies which can be auto populated. Include things that cannot be auto populated i.e. Echo, Carotid and venous dopplers, Foley, Bipap, HD, tubes/drains, wound vac, central lines etc) 12/14/2017 echo: - Left ventricle: The cavity size was normal. Wall thickness was   normal. The estimated ejection fraction was 55%. Wall motion was   normal; there were no regional wall motion abnormalities.   Features are consistent with a pseudonormal left ventricular   filling pattern, with concomitant abnormal relaxation and   increased filling pressure (grade 2 diastolic dysfunction). - Aortic valve: There was no stenosis. - Mitral valve: Mildly calcified annulus. Mildly calcified leaflets   . There was no significant regurgitation. - Right ventricle: The cavity size was mildly dilated. Systolic   function was normal. - Tricuspid valve: Peak RV-RA gradient (S): 30 mm Hg. - Pulmonary arteries: PA peak pressure: 45 mm Hg (S). - Systemic veins: IVC measured 2.3 cm with < 50% respirophasic   variation, suggesting RA pressure 15 mmHg.   12/13/2017 excision of scrotum and debridement of penis   Intubated 12/14/2017 to 12/18/2017  Antimicrobials: (specify start and planned stop date. Auto populated tables are space occupying and do not give end dates)  IV Zosyn, clindamycin, vancomycin 12/13/2017 12/20/2017   Subjective: Patient reports he is doing well.  He denies any nausea, vomiting, abdominal pain.  He does continue to report significant pain with dressing changes on his testicles.  Objective: Vitals:   12/23/17 2124 12/24/17 0520 12/24/17 1117 12/24/17 1303  BP: 117/67 (!) 113/58 106/62 (!) 92/53  Pulse: 77 80  79  Resp: 13 15  17   Temp: 98.6 F (37 C) (!)  97.4 F (36.3 C)  98.2 F (36.8 C)  TempSrc: Oral Oral  Oral  SpO2: 95% 92%  94%  Weight:  83 kg (182 lb 15.7 oz)    Height:         Intake/Output Summary (Last 24 hours) at 12/24/2017 1347 Last data filed at 12/24/2017 1214 Gross per 24 hour  Intake 910 ml  Output 3250 ml  Net -2340 ml   Filed Weights   12/22/17 0601 12/23/17 0620 12/24/17 0520  Weight: 89.4 kg (197 lb 1.5 oz) 81.9 kg (180 lb 8.9 oz) 83 kg (182 lb 15.7 oz)    Examination:  General exam: Appears calm and comfortable  Respiratory system: Clear to auscultation. Respiratory effort normal. Cardiovascular system: Regular rate and rhythm, no murmurs,  Gastrointestinal system: Soft, nondistended, no organomegaly, plus bowel sounds Central nervous system: Alert and oriented. No focal neurological deficits. Extremities: Trace lower extremity edema Skin: Denuded testicles bilaterally with some clear serosanguineous drainage, Foley in place Psychiatry: Judgement and insight appear normal. Mood & affect appropriate.     Data Reviewed: I have personally reviewed following labs and imaging studies  CBC: Recent Labs  Lab 12/18/17 0520 12/21/17 0451 12/22/17 0458 12/23/17 0409  WBC 19.4* 18.2* 17.7* 19.8*  NEUTROABS  --  14.3*  --   --   HGB 9.1* 9.9* 9.6* 10.0*  HCT 27.6* 29.5* 28.0* 29.6*  MCV 90.8 87.5 86.7 88.4  PLT 293 347 322 370   Basic Metabolic Panel: Recent Labs  Lab 12/18/17 0520  12/20/17 1343 12/21/17 0451 12/22/17 0458 12/23/17 0409 12/24/17 0349  NA 148*   < > 141 140 136 138 135  K 3.2*   < > 2.9* 2.7* 3.2* 3.4* 4.1  CL 110   < > 104 102 105 107 105  CO2 26   < > 26 24 23  20* 20*  GLUCOSE 251*   < > 304* 183* 178* 135* 179*  BUN 41*   < > 26* 21* 16 14 19   CREATININE 1.28*   < > 0.94 0.85 0.93 0.97 1.13  CALCIUM 8.2*   < > 8.1* 8.3* 8.2* 8.5* 8.4*  MG 1.9  --   --   --  1.9  --   --    < > = values in this interval not displayed.   GFR: Estimated Creatinine Clearance: 67.5 mL/min (by C-G formula based on SCr of 1.13 mg/dL). Liver Function Tests: Recent Labs  Lab 12/22/17 0458  AST 21  ALT 39  ALKPHOS 136*   BILITOT 0.8  PROT 5.5*  ALBUMIN 2.1*   No results for input(s): LIPASE, AMYLASE in the last 168 hours. No results for input(s): AMMONIA in the last 168 hours. Coagulation Profile: No results for input(s): INR, PROTIME in the last 168 hours. Cardiac Enzymes: No results for input(s): CKTOTAL, CKMB, CKMBINDEX, TROPONINI in the last 168 hours. BNP (last 3 results) No results for input(s): PROBNP in the last 8760 hours. HbA1C: No results for input(s): HGBA1C in the last 72 hours. CBG: Recent Labs  Lab 12/23/17 1140 12/23/17 1647 12/23/17 2120 12/24/17 0715 12/24/17 1211  GLUCAP 108* 170* 186* 198* 264*   Lipid Profile: No results for input(s): CHOL, HDL, LDLCALC, TRIG, CHOLHDL, LDLDIRECT in the last 72 hours. Thyroid Function Tests: No results for input(s): TSH, T4TOTAL, FREET4, T3FREE, THYROIDAB in the last 72 hours. Anemia Panel: Recent Labs    12/23/17 0409  VITAMINB12 1,338*  FERRITIN 411*  TIBC 157*  IRON 48  Sepsis Labs: No results for input(s): PROCALCITON, LATICACIDVEN in the last 168 hours.  Recent Results (from the past 240 hour(s))  Culture, respiratory (NON-Expectorated)     Status: None   Collection Time: 12/15/17  3:03 AM  Result Value Ref Range Status   Specimen Description   Final    TRACHEAL ASPIRATE Performed at [facility], [address] [place], [place] [address]    Special Requests   Final    NONE Performed at [facility], [address] [place], [place] [address]    Gram Stain   Final    ABUNDANT WBC PRESENT, PREDOMINANTLY PMN ABUNDANT YEAST Performed at [facility], [address] [place], [place] [address]    Culture ABUNDANT CANDIDA ALBICANS  Final   Report Status 12/17/2017 FINAL  Final         Radiology Studies: No results found.      Scheduled Meds: . amLODipine  5 mg Oral Daily  . atenolol  50 mg Oral Daily   And  . chlorthalidone  25 mg Oral Daily  . benazepril   20 mg Oral Daily  . chlorhexidine  15 mL Mouth Rinse BID  . Chlorhexidine Gluconate Cloth  6 each Topical Daily  . collagenase  1 application Topical BID  . doxazosin  8 mg Oral Daily  . enoxaparin (LOVENOX) injection  40 mg Subcutaneous Q24H  . famotidine  20 mg Oral BID  . gabapentin  800 mg Oral QID  . insulin aspart  0-15 Units Subcutaneous TID WC  . insulin aspart  0-5 Units Subcutaneous QHS  . mouth rinse  15 mL Mouth Rinse q12n4p  . multivitamin with minerals  1 tablet Oral Daily  . nutrition supplement (JUVEN)  1 packet Oral BID BM  . sodium chloride flush  10-40 mL Intracatheter Q12H  . traZODone  100 mg Oral QHS   Continuous Infusions:   LOS: 11 days    Time spent: 35    Delaine LameShrey C Everleigh Colclasure, MD [facility]  If 7PM-7AM, please contact night-coverage [email or web address] Password [email or web address] 12/24/2017, 1:47 PM

## 2017-12-24 NOTE — Progress Notes (Signed)
11 Days Post-Op Subjective: Patient reports feeling well. He asked they stop changing the scrotal dressing because of pain and just "spray" some of the liquid on his testicles.  He has a Foley catheter.  He is having normal bowel movements.  He said no chest pain or shortness of breath.  He has had no fevers or chills.  Objective: Vital signs in last 24 hours: Temp:  [97.4 F (36.3 C)-99 F (37.2 C)] 97.4 F (36.3 C) (04/14 0520) Pulse Rate:  [73-80] 80 (04/14 0520) Resp:  [13-20] 15 (04/14 0520) BP: (106-117)/(58-67) 106/62 (04/14 1117) SpO2:  [92 %-95 %] 92 % (04/14 0520) Weight:  [83 kg (182 lb 15.7 oz)] 83 kg (182 lb 15.7 oz) (04/14 0520)  Intake/Output from previous day: 04/13 0701 - 04/14 0700 In: 1030 [P.O.:1030] Out: 3750 [Urine:3750] Intake/Output this shift: Total I/O In: 240 [P.O.:240] Out: 300 [Urine:300]  Physical Exam:  No acute distress, watching TV Abdomen soft and nontender GU- testicles hanging out.  They are moist and he says his gauze fell off when he went to have a bowel movement.  Lab Results: Recent Labs    12/22/17 0458 12/23/17 0409  HGB 9.6* 10.0*  HCT 28.0* 29.6*   BMET Recent Labs    12/23/17 0409 12/24/17 0349  NA 138 135  K 3.4* 4.1  CL 107 105  CO2 20* 20*  GLUCOSE 135* 179*  BUN 14 19  CREATININE 0.97 1.13  CALCIUM 8.5* 8.4*   No results for input(s): LABPT, INR in the last 72 hours. No results for input(s): LABURIN in the last 72 hours. Results for orders placed or performed during the hospital encounter of 12/13/17  Blood Culture (routine x 2)     Status: None   Collection Time: 12/13/17  1:59 PM  Result Value Ref Range Status   Specimen Description BLOOD RIGHT WRIST  Final   Special Requests   Final    BOTTLES DRAWN AEROBIC AND ANAEROBIC Blood Culture adequate volume   Culture   Final    NO GROWTH 5 DAYS Performed at [facility], [address] [place], [place] [address]    Report Status 12/18/2017 FINAL  Final   Blood Culture (routine x 2)     Status: None   Collection Time: 12/13/17  2:05 PM  Result Value Ref Range Status   Specimen Description BLOOD RIGHT FOREARM  Final   Special Requests   Final    BOTTLES DRAWN AEROBIC AND ANAEROBIC Blood Culture adequate volume   Culture   Final    NO GROWTH 5 DAYS Performed at [facility], [address] [place], [place] [address]    Report Status 12/18/2017 FINAL  Final  Aerobic/Anaerobic Culture (surgical/deep wound)     Status: None   Collection Time: 12/13/17  9:01 PM  Result Value Ref Range Status   Specimen Description   Final    WOUND Performed at [facility], [address] [place], [place] [address]    Special Requests   Final    NONE Performed at [facility], [address] [place], [place] [address]    Gram Stain   Final    NO WBC SEEN ABUNDANT GRAM POSITIVE COCCI RARE GRAM POSITIVE RODS    Culture   Final    NORMAL SKIN FLORA MODERATE ANAEROCOCCUS PREVOTII MODERATE ANAEROBIC GRAM NEGATIVE ROD BETA LACTAMASE POSITIVE Performed at [facility], [address] [place], [place] [address]    Report  Status 12/19/2017 FINAL  Final  Culture, blood (routine x 2)     Status: None   Collection Time: 12/13/17 11:04 PM  Result Value Ref Range Status   Specimen Description   Final    BLOOD RIGHT HAND Performed at [facility], [address], [place], [place] [address]    Special Requests   Final    BOTTLES DRAWN AEROBIC AND ANAEROBIC Blood Culture adequate volume Performed at [facility], [address], [place], [place] [address]    Culture   Final    NO GROWTH 5 DAYS Performed at [facility], [address] [place], [place] [address]    Report Status 12/19/2017 FINAL  Final  Culture, blood (routine x 2)     Status: None   Collection Time: 12/13/17 11:04 PM  Result Value Ref Range Status   Specimen Description   Final    BLOOD LEFT  ANTECUBITAL Performed at [facility], [address], [place], [place] [address]    Special Requests   Final    BOTTLES DRAWN AEROBIC AND ANAEROBIC Blood Culture adequate volume Performed at [facility], [address] [place], [place] [address]    Culture   Final    NO GROWTH 5 DAYS Performed at [facility], [address], [place], [place] [address]    Report Status 12/19/2017 FINAL  Final  MRSA PCR Screening     Status: None   Collection Time: 12/14/17  1:53 AM  Result Value Ref Range Status   MRSA by PCR NEGATIVE NEGATIVE Final    Comment:        The GeneXpert MRSA Assay (FDA approved for NASAL specimens only), is one component of a comprehensive MRSA colonization surveillance program. It is not intended to diagnose MRSA infection nor to guide or monitor treatment for MRSA infections. Performed at [facility], [address] [place], [place] [address]   Culture, respiratory (NON-Expectorated)     Status: None   Collection Time: 12/15/17  3:03 AM  Result Value Ref Range Status   Specimen Description   Final    TRACHEAL ASPIRATE Performed at [facility], [address], [place], [place] [address]    Special Requests   Final    NONE Performed at [facility], [address], [place], [place] [address]    Gram Stain   Final    ABUNDANT WBC PRESENT, PREDOMINANTLY PMN ABUNDANT YEAST Performed at [facility], [address], [place], [place] [address]    Culture ABUNDANT CANDIDA ALBICANS  Final   Report Status 12/17/2017 FINAL  Final    Studies/Results: No results found.  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  Greatly appreciate their expertise.  He is on supplements and multivitamin.  I asked nurse to cover the testicles with some wet gauze, ABD and mesh underwear.   LOS: 11 days   Jerilee Field 12/24/2017, 11:34  AM

## 2017-12-25 ENCOUNTER — Encounter (HOSPITAL_COMMUNITY): Payer: Self-pay | Admitting: Plastic Surgery

## 2017-12-25 ENCOUNTER — Encounter (HOSPITAL_COMMUNITY)
Admission: EM | Disposition: A | Discharge status: Home / Self Care | Payer: Self-pay | Source: Home / Self Care | Attending: Internal Medicine

## 2017-12-25 ENCOUNTER — Inpatient Hospital Stay (HOSPITAL_COMMUNITY): Payer: Medicare HMO | Admitting: Anesthesiology

## 2017-12-25 ENCOUNTER — Inpatient Hospital Stay (HOSPITAL_COMMUNITY): Payer: Medicare HMO

## 2017-12-25 HISTORY — PX: INCISION AND DRAINAGE OF WOUND: SHX1803

## 2017-12-25 HISTORY — PX: APPLICATION OF A-CELL OF EXTREMITY: SHX6303

## 2017-12-25 LAB — CBC
HCT: 29.3 % — ABNORMAL LOW (ref 39.0–52.0)
Hemoglobin: 10 g/dL — ABNORMAL LOW (ref 13.0–17.0)
MCH: 30.5 pg (ref 26.0–34.0)
MCHC: 34.1 g/dL (ref 30.0–36.0)
MCV: 89.3 fL (ref 78.0–100.0)
Platelets: 432 K/uL — ABNORMAL HIGH (ref 150–400)
RBC: 3.28 MIL/uL — ABNORMAL LOW (ref 4.22–5.81)
RDW: 16.2 % — ABNORMAL HIGH (ref 11.5–15.5)
WBC: 16.9 K/uL — ABNORMAL HIGH (ref 4.0–10.5)

## 2017-12-25 LAB — GLUCOSE, CAPILLARY
GLUCOSE-CAPILLARY: 157 mg/dL — AB (ref 65–99)
GLUCOSE-CAPILLARY: 150 mg/dL — AB (ref 65–99)
Glucose-Capillary: 187 mg/dL — ABNORMAL HIGH (ref 65–99)
Glucose-Capillary: 194 mg/dL — ABNORMAL HIGH (ref 65–99)
Glucose-Capillary: 179 mg/dL — ABNORMAL HIGH (ref 65–99)

## 2017-12-25 LAB — FOLATE RBC
Folate, Hemolysate: 416.2 ng/mL
Folate, RBC: 1425 ng/mL (ref >498)
Hematocrit: 29.2 % — ABNORMAL LOW (ref 37.5–51.0)

## 2017-12-25 LAB — MRSA PCR SCREENING: MRSA by PCR: NEGATIVE

## 2017-12-25 SURGERY — IRRIGATION AND DEBRIDEMENT WOUND
Anesthesia: General | Site: Scrotum

## 2017-12-25 MED ORDER — LIDOCAINE 2% (20 MG/ML) 5 ML SYRINGE
INTRAMUSCULAR | Status: DC | PRN
  Administered 2017-12-25: 100 mg via INTRAVENOUS

## 2017-12-25 MED ORDER — ALBUMIN HUMAN 5 % IV SOLN
INTRAVENOUS | Status: DC | PRN
  Administered 2017-12-25: 08:00:00 via INTRAVENOUS

## 2017-12-25 MED ORDER — ALBUMIN HUMAN 5 % IV SOLN
INTRAVENOUS | Status: AC
  Filled 2017-12-25: qty 250

## 2017-12-25 MED ORDER — PROPOFOL 10 MG/ML IV BOLUS
INTRAVENOUS | Status: AC
  Filled 2017-12-25: qty 20

## 2017-12-25 MED ORDER — PROPOFOL 10 MG/ML IV BOLUS
INTRAVENOUS | Status: DC | PRN
  Administered 2017-12-25: 100 mg via INTRAVENOUS

## 2017-12-25 MED ORDER — MUPIROCIN 2 % EX OINT
1.0000 "application " | TOPICAL_OINTMENT | Freq: Two times a day (BID) | CUTANEOUS | Status: DC
  Filled 2017-12-25: qty 22

## 2017-12-25 MED ORDER — ONDANSETRON HCL 4 MG/2ML IJ SOLN
INTRAMUSCULAR | Status: AC
  Filled 2017-12-25: qty 2

## 2017-12-25 MED ORDER — METOCLOPRAMIDE HCL 5 MG/ML IJ SOLN: 10.0000 mg | Freq: Once | INTRAMUSCULAR | Status: DC | PRN

## 2017-12-25 MED ORDER — FENTANYL CITRATE (PF) 100 MCG/2ML IJ SOLN
INTRAMUSCULAR | Status: AC
  Filled 2017-12-25: qty 2

## 2017-12-25 MED ORDER — EPHEDRINE 5 MG/ML INJ
INTRAVENOUS | Status: AC
  Filled 2017-12-25: qty 10

## 2017-12-25 MED ORDER — SODIUM CHLORIDE 0.9 % IV SOLN
INTRAVENOUS | Status: DC | PRN
  Administered 2017-12-25: 500 mL

## 2017-12-25 MED ORDER — MENTHOL 3 MG MT LOZG
1.0000 | LOZENGE | OROMUCOSAL | Status: DC | PRN
  Filled 2017-12-25: qty 9

## 2017-12-25 MED ORDER — SUGAMMADEX SODIUM 200 MG/2ML IV SOLN
INTRAVENOUS | Status: AC
  Filled 2017-12-25: qty 2

## 2017-12-25 MED ORDER — DEXAMETHASONE SODIUM PHOSPHATE 10 MG/ML IJ SOLN
INTRAMUSCULAR | Status: AC
  Filled 2017-12-25: qty 1

## 2017-12-25 MED ORDER — HYDROMORPHONE HCL 1 MG/ML IJ SOLN
0.5000 mg | INTRAMUSCULAR | Status: DC | PRN
  Administered 2017-12-25 – 2017-12-27 (×10): 0.5 mg via INTRAVENOUS
  Filled 2017-12-25 (×12): qty 0.5

## 2017-12-25 MED ORDER — EPHEDRINE SULFATE 50 MG/ML IJ SOLN
INTRAMUSCULAR | Status: DC | PRN
  Administered 2017-12-25 (×3): 10 mg via INTRAVENOUS

## 2017-12-25 MED ORDER — SUCCINYLCHOLINE CHLORIDE 200 MG/10ML IV SOSY
PREFILLED_SYRINGE | INTRAVENOUS | Status: AC
  Filled 2017-12-25: qty 10

## 2017-12-25 MED ORDER — PHENYLEPHRINE 40 MCG/ML (10ML) SYRINGE FOR IV PUSH (FOR BLOOD PRESSURE SUPPORT)
PREFILLED_SYRINGE | INTRAVENOUS | Status: DC | PRN
  Administered 2017-12-25 (×5): 80 ug via INTRAVENOUS

## 2017-12-25 MED ORDER — LIDOCAINE 2% (20 MG/ML) 5 ML SYRINGE
INTRAMUSCULAR | Status: AC
  Filled 2017-12-25: qty 5

## 2017-12-25 MED ORDER — PHENYLEPHRINE 40 MCG/ML (10ML) SYRINGE FOR IV PUSH (FOR BLOOD PRESSURE SUPPORT)
PREFILLED_SYRINGE | INTRAVENOUS | Status: AC
  Filled 2017-12-25: qty 10

## 2017-12-25 MED ORDER — FENTANYL CITRATE (PF) 100 MCG/2ML IJ SOLN
25.0000 ug | INTRAMUSCULAR | Status: DC | PRN
  Administered 2017-12-25 (×2): 50 ug via INTRAVENOUS

## 2017-12-25 MED ORDER — LIDOCAINE-EPINEPHRINE (PF) 1 %-1:200000 IJ SOLN
INTRAMUSCULAR | Status: AC
  Filled 2017-12-25: qty 30

## 2017-12-25 MED ORDER — LIDOCAINE-EPINEPHRINE (PF) 1 %-1:200000 IJ SOLN
INTRAMUSCULAR | Status: DC | PRN
  Administered 2017-12-25: 30 mL

## 2017-12-25 MED ORDER — MIDAZOLAM HCL 2 MG/2ML IJ SOLN
INTRAMUSCULAR | Status: DC | PRN
  Administered 2017-12-25: 2 mg via INTRAVENOUS

## 2017-12-25 MED ORDER — SODIUM CHLORIDE 0.9 % IR SOLN
Status: DC | PRN
  Administered 2017-12-25: 2000 mL

## 2017-12-25 MED ORDER — LACTATED RINGERS IV SOLN
INTRAVENOUS | Status: DC | PRN
  Administered 2017-12-25 (×2): via INTRAVENOUS

## 2017-12-25 MED ORDER — MIDAZOLAM HCL 2 MG/2ML IJ SOLN
INTRAMUSCULAR | Status: AC
  Filled 2017-12-25: qty 2

## 2017-12-25 MED ORDER — SODIUM CHLORIDE 0.9 % IV SOLN
INTRAVENOUS | Status: AC
  Filled 2017-12-25: qty 500000

## 2017-12-25 MED ORDER — MEPERIDINE HCL 50 MG/ML IJ SOLN: 6.2500 mg | INTRAMUSCULAR | Status: DC | PRN

## 2017-12-25 MED ORDER — ROCURONIUM BROMIDE 10 MG/ML (PF) SYRINGE
PREFILLED_SYRINGE | INTRAVENOUS | Status: AC
  Filled 2017-12-25: qty 5

## 2017-12-25 SURGICAL SUPPLY — 43 items
BAG DECANTER FOR FLEXI CONT (MISCELLANEOUS) ×2 IMPLANT
BANDAGE ACE 4X5 VEL STRL LF (GAUZE/BANDAGES/DRESSINGS) ×2 IMPLANT
BNDG GAUZE ELAST 4 BULKY (GAUZE/BANDAGES/DRESSINGS) IMPLANT
BRIEF STRETCH FOR OB PAD LRG (UNDERPADS AND DIAPERS) ×2 IMPLANT
CONT SPEC 4OZ CLIKSEAL STRL BL (MISCELLANEOUS) ×2 IMPLANT
DECANTER SPIKE VIAL GLASS SM (MISCELLANEOUS) ×2 IMPLANT
DRAIN CHANNEL 15F RND FF 3/16 (WOUND CARE) ×2 IMPLANT
DRAPE INCISE IOBAN 66X45 STRL (DRAPES) ×2 IMPLANT
DRAPE LAPAROTOMY T 102X78X121 (DRAPES) ×2 IMPLANT
DRAPE UTILITY XL STRL (DRAPES) ×4 IMPLANT
DRESSING DUODERM 4X4 STERILE (GAUZE/BANDAGES/DRESSINGS) IMPLANT
DRSG ADAPTIC 3X8 NADH LF (GAUZE/BANDAGES/DRESSINGS) ×2 IMPLANT
DRSG PAD ABDOMINAL 8X10 ST (GAUZE/BANDAGES/DRESSINGS) IMPLANT
ELECT REM PT RETURN 15FT ADLT (MISCELLANEOUS) ×4 IMPLANT
GAUZE SPONGE 4X4 12PLY STRL (GAUZE/BANDAGES/DRESSINGS) ×4 IMPLANT
GAUZE XEROFORM 5X9 LF (GAUZE/BANDAGES/DRESSINGS) ×2 IMPLANT
GLOVE BIO SURGEON STRL SZ 6.5 (GLOVE) ×3 IMPLANT
GLOVE BIO SURGEONS STRL SZ 6.5 (GLOVE) ×1
GOWN STRL REUS W/TWL LRG LVL3 (GOWN DISPOSABLE) ×8 IMPLANT
HANDPIECE INTERPULSE COAX TIP (DISPOSABLE)
KIT BASIN OR (CUSTOM PROCEDURE TRAY) ×4 IMPLANT
MICROMATRIX 1000MG (Tissue) ×4 IMPLANT
NEEDLE HYPO 22GX1.5 SAFETY (NEEDLE) ×2 IMPLANT
PACK GENERAL/GYN (CUSTOM PROCEDURE TRAY) ×4 IMPLANT
PAD ABD 8X10 STRL (GAUZE/BANDAGES/DRESSINGS) ×2 IMPLANT
PAD CAST 4YDX4 CTTN HI CHSV (CAST SUPPLIES) ×2 IMPLANT
PADDING CAST COTTON 4X4 STRL (CAST SUPPLIES)
SET HNDPC FAN SPRY TIP SCT (DISPOSABLE) IMPLANT
SHEET LAVH (DRAPES) ×2 IMPLANT
SOL PREP PROV IODINE SCRUB 4OZ (MISCELLANEOUS) ×4 IMPLANT
SOLUTION PARTIC MCRMTRX 1000MG (Tissue) IMPLANT
STAPLER VISISTAT 35W (STAPLE) ×2 IMPLANT
SUT MNCRL AB 4-0 PS2 18 (SUTURE) ×2 IMPLANT
SUT MON AB 3-0 SH 27 (SUTURE) ×8
SUT MON AB 3-0 SH27 (SUTURE) IMPLANT
SUT MON AB 4-0 SH 27 (SUTURE) ×4 IMPLANT
SUT MON AB 5-0 PS2 18 (SUTURE) ×4 IMPLANT
SUT SILK 4 0 PS 2 (SUTURE) IMPLANT
SUT VIC AB 5-0 PS2 18 (SUTURE) IMPLANT
SWAB COLLECTION DEVICE MRSA (MISCELLANEOUS) IMPLANT
SWAB CULTURE ESWAB REG 1ML (MISCELLANEOUS) ×2 IMPLANT
SYR CONTROL 10ML LL (SYRINGE) ×2 IMPLANT
TOWEL OR 17X26 10 PK STRL BLUE (TOWEL DISPOSABLE) ×6 IMPLANT

## 2017-12-25 NOTE — Op Note (Signed)
DATE OF OPERATION: 12/25/2017  LOCATION:  [facility]  PREOPERATIVE DIAGNOSIS: Fournier's gangrene of scrotum bilaterally  POSTOPERATIVE DIAGNOSIS: Same  PROCEDURE: Bilateral thigh flaps to cover the testicles with placement of 1 gm Acell powder. Excision of skin and subcutaneous tissue of the groin and scrotum 10 x 15 cm  SURGEON: Jerae Izard Sanger Manuella Blackson, DO  EBL: 10 cc  CONDITION: Stable  COMPLICATIONS: None  INDICATION: The patient, Patrick Aguilar, is a 67 y.o. male born on 1951-08-16, is here for treatment of bilateral fournier's gangrene.   PROCEDURE DETAILS:  The patient was seen prior to surgery and marked.  The IV antibiotics were given. The patient was taken to the operating room and given a general anesthetic. A standard time out was performed and all information was confirmed by those in the room. SCDs were placed.   The patient was placed in the legs up position.  He was prepped and draped in the usual sterile fashion.  The foley was kept in place.  Local was injected into the thigh subcuticular area.  The wound of the scrotum and perineum was irrigated with saline and antibiotic solution.  The skin and subcutaneous tissue of the scrotum and perineum was excised with a #15 blade and scissors for the 10 x 15 cm area.  Hemostasis was achieved with electrocautery.  The retractors were used to lift the skin edge of the thighs.  The bovie was used to lift the skin and subcutaneous tissue to create a pocket on each thigh. Attention was given to avoid deep structures of nerves and vessels.  No lymph nodes were seen either. This was ~ 6 x 6 cm in size to fit each scrotum.  All of the Acell powder was sprinkled on the area.  A #15 blake drain was placed and secured to the skin of the left thigh with a 4-0 Silk.  The deep layers were used to closed with the 3-0 Monocryl.  The 4-0 Monocryl was used to close the skin with vertical mattress sutures.  The 5-0 Monocryl was  then used to do a running closure.  The penis was then placed in the dorsal position and the sides of the penis at the base were closed with 4-0 Vertical mattress sutures and 5-0 Monocryl.  The dressing of bactroban, xeroform and ABDs were placed with mesh panties.  The patient was allowed to wake up and taken to recovery room in stable condition at the end of the case. The family was notified at the end of the case.

## 2017-12-25 NOTE — Progress Notes (Signed)
PT Cancellation Note  Patient Details Name: Patrick Aguilar MRN: [id number] DOB: July 27, 1951   Cancelled Treatment:    Reason Eval/Treat Not Completed: Patient at procedure or test/unavailable--back to OR today. Will hold PT today and check back another day.    Rebeca AlertJannie Verda Mehta, MPT Pager: [phone]

## 2017-12-25 NOTE — Interval H&P Note (Signed)
History and Physical Interval Note:  12/25/2017 6:59 AM  Patrick Aguilar  has presented today for surgery, with the diagnosis of Fournier's gangrene of scrotum  The various methods of treatment have been discussed with the patient and family. After consideration of risks, benefits and other options for treatment, the patient has consented to  Procedure(s): Irrigation and debridement of Fournier's of scrotum with possible placement of testes in subcutaneous thigh pockets and Acell placement (N/A) POSSIBLE APPLICATION OF A-CELL (N/A) as a surgical intervention .  The patient's history has been reviewed, patient examined, no change in status, stable for surgery.  I have reviewed the patient's chart and labs.  Questions were answered to the patient's satisfaction.     Alena Billslaire S Dillingham

## 2017-12-25 NOTE — Anesthesia Procedure Notes (Signed)
Procedure Name: LMA Insertion Date/Time: 12/25/2017 7:34 AM Performed by: Delphia Grateshandler, Makaylyn Sinyard, CRNA Pre-anesthesia Checklist: Emergency Drugs available, Suction available, Patient being monitored and Patient identified Patient Re-evaluated:Patient Re-evaluated prior to induction Oxygen Delivery Method: Circle system utilized Preoxygenation: Pre-oxygenation with 100% oxygen Induction Type: IV induction LMA: LMA inserted LMA Size: 4.0 Number of attempts: 1 Placement Confirmation: positive ETCO2 Tube secured with: Tape Dental Injury: Teeth and Oropharynx as per pre-operative assessment  Comments: Poor dentition.

## 2017-12-25 NOTE — Progress Notes (Signed)
Called MD regarding pt's complaint of burning in groin area.  Orders were received. Will continue to monitor patient.

## 2017-12-25 NOTE — Progress Notes (Signed)
PROGRESS NOTE    Patrick Aguilar  ZOX:[id number] DOB: 05-Oct-1950 DOA: 12/13/2017 PCP: [name], [name], MD    Brief Narrative:   67 year old with past medical history relevant for hypertension, benign prostatic hypertrophy, untreated type 2 diabetes, COPD, neuropathy admitted from any pain with Fournier's gangrene status post scrotal debridement/total scrotectomy on 12/14/2017 with course complicated by sepsis with ARDS and a KI status post extubation on 12/19/2017.  He is pending further surgical treatments for his Fournier's gangrene.   Assessment & Plan:   Principal Problem:   Fournier gangrene Active Problems:   HTN (hypertension)   COPD (chronic obstructive pulmonary disease) (HCC)   #) Septic shock complicated by ARDS and AK I: Resolved, patient extubated on 12/18/2017.  #) Fournier's gangrene: Status post debridement on 12/14/2017. Surgical culture from 12/13/2017 shows normal skin flora including moderate enterococcus prevotii, moderate anaerobic GNR, beta-lactamase positive.  Patient was on vancomycin, Zosyn, clindamycin continued from 12/13/2017 to 14 2019. -Hold antibiotics -Patient taken to the OR on 12/25/2017 with bilateral thigh flaps to cover the testicles and excision of tissue -Continue protein supplements, multivitamin, vitamin C, zinc for wound healing  #) Hypertension:  -Continue amlodipine 5 mg daily -Continue t atenolol 50 mg daily -Continue chlorthalidone 25 mg daily -Continue benazepril 20 mg daily -PRN hydralazine  #) Type 2 diabetes: Patient was diagnosed during this hospitalization with type 2 diabetes.  His A1c is 7.4.  -Sliding scale insulin, before meals at bedtime  -start oral hypoglycemics on discharge  #) Neuropathy: -Continue gabapentin 800 mg 4 times daily  #) COPD: No PFTs in system, unknown Gold stage - PRN bronchodilators  Fluids: Tolerating p.o. Electrolytes: Monitor and supplement Nutrition: Regular diet, supplements per above  Prophylaxis:  SCDs  Disposition: Pending SNF placement  Full code    Consultants:   Plastic surgery  Urology  PC CM  Procedures: (Don't include imaging studies which can be auto populated. Include things that cannot be auto populated i.e. Echo, Carotid and venous dopplers, Foley, Bipap, HD, tubes/drains, wound vac, central lines etc) 12/14/2017 echo: - Left ventricle: The cavity size was normal. Wall thickness was   normal. The estimated ejection fraction was 55%. Wall motion was   normal; there were no regional wall motion abnormalities.   Features are consistent with a pseudonormal left ventricular   filling pattern, with concomitant abnormal relaxation and   increased filling pressure (grade 2 diastolic dysfunction). - Aortic valve: There was no stenosis. - Mitral valve: Mildly calcified annulus. Mildly calcified leaflets   . There was no significant regurgitation. - Right ventricle: The cavity size was mildly dilated. Systolic   function was normal. - Tricuspid valve: Peak RV-RA gradient (S): 30 mm Hg. - Pulmonary arteries: PA peak pressure: 45 mm Hg (S). - Systemic veins: IVC measured 2.3 cm with < 50% respirophasic   variation, suggesting RA pressure 15 mmHg.   12/13/2017 excision of scrotum and debridement of penis   Intubated 12/14/2017 to 12/18/2017  Antimicrobials: (specify start and planned stop date. Auto populated tables are space occupying and do not give end dates)  IV Zosyn, clindamycin, vancomycin 12/13/2017 12/20/2017   Subjective: Patient reports he is doing well.  He is returned from the OR and his testicles are tender.  He otherwise appears to be in good spirits.  Exline Objective: Vitals:   12/25/17 1000 12/25/17 1017 12/25/17 1131 12/25/17 1223  BP: (!) 91/59 (!) 89/58 103/62 (!) 98/57  Pulse: 74 73 67 66  Resp: 17 16 14  14  Temp: 99.3 F (37.4 C) 98.7 F (37.1 C) 98.5 F (36.9 C) 98 F (36.7 C)  TempSrc:   Oral Oral  SpO2: 92% 99% 100% 99%  Weight:       Height:        Intake/Output Summary (Last 24 hours) at 12/25/2017 1309 Last data filed at 12/25/2017 1225 Gross per 24 hour  Intake 2270 ml  Output 2175 ml  Net 95 ml   Filed Weights   12/23/17 0620 12/24/17 0520 12/25/17 0555  Weight: 81.9 kg (180 lb 8.9 oz) 83 kg (182 lb 15.7 oz) 84.6 kg (186 lb 8.2 oz)    Examination:  General exam: Appears calm and comfortable  Respiratory system: Clear to auscultation. Respiratory effort normal. Cardiovascular system: Regular rate and rhythm, no murmurs,  Gastrointestinal system: Soft, nondistended, no organomegaly, plus bowel sounds Central nervous system: Alert and oriented. No focal neurological deficits. Extremities: Trace lower extremity edema Skin: Perineum and testicles are dressed Psychiatry: Judgement and insight appear normal. Mood & affect appropriate.     Data Reviewed: I have personally reviewed following labs and imaging studies  CBC: Recent Labs  Lab 12/21/17 0451 12/22/17 0458 12/23/17 0409 12/25/17 0412  WBC 18.2* 17.7* 19.8* 16.9*  NEUTROABS 14.3*  --   --   --   HGB 9.9* 9.6* 10.0* 10.0*  HCT 29.5* 28.0* 29.6* 29.3*  MCV 87.5 86.7 88.4 89.3  PLT 347 322 370 432*   Basic Metabolic Panel: Recent Labs  Lab 12/20/17 1343 12/21/17 0451 12/22/17 0458 12/23/17 0409 12/24/17 0349  NA 141 140 136 138 135  K 2.9* 2.7* 3.2* 3.4* 4.1  CL 104 102 105 107 105  CO2 26 24 23  20* 20*  GLUCOSE 304* 183* 178* 135* 179*  BUN 26* 21* 16 14 19   CREATININE 0.94 0.85 0.93 0.97 1.13  CALCIUM 8.1* 8.3* 8.2* 8.5* 8.4*  MG  --   --  1.9  --   --    GFR: Estimated Creatinine Clearance: 68.1 mL/min (by C-G formula based on SCr of 1.13 mg/dL). Liver Function Tests: Recent Labs  Lab 12/22/17 0458  AST 21  ALT 39  ALKPHOS 136*  BILITOT 0.8  PROT 5.5*  ALBUMIN 2.1*   No results for input(s): LIPASE, AMYLASE in the last 168 hours. No results for input(s): AMMONIA in the last 168 hours. Coagulation Profile: No  results for input(s): INR, PROTIME in the last 168 hours. Cardiac Enzymes: No results for input(s): CKTOTAL, CKMB, CKMBINDEX, TROPONINI in the last 168 hours. BNP (last 3 results) No results for input(s): PROBNP in the last 8760 hours. HbA1C: No results for input(s): HGBA1C in the last 72 hours. CBG: Recent Labs  Lab 12/24/17 1709 12/24/17 2123 12/25/17 0659 12/25/17 0932 12/25/17 1102  GLUCAP 231* 193* 187* 194* 179*   Lipid Profile: No results for input(s): CHOL, HDL, LDLCALC, TRIG, CHOLHDL, LDLDIRECT in the last 72 hours. Thyroid Function Tests: No results for input(s): TSH, T4TOTAL, FREET4, T3FREE, THYROIDAB in the last 72 hours. Anemia Panel: Recent Labs    12/23/17 0409  VITAMINB12 1,338*  FERRITIN 411*  TIBC 157*  IRON 48   Sepsis Labs: No results for input(s): PROCALCITON, LATICACIDVEN in the last 168 hours.  Recent Results (from the past 240 hour(s))  MRSA PCR Screening     Status: None   Collection Time: 12/25/17  6:53 AM  Result Value Ref Range Status   MRSA by PCR NEGATIVE NEGATIVE Final    Comment:  The GeneXpert MRSA Assay (FDA approved for NASAL specimens only), is one component of a comprehensive MRSA colonization surveillance program. It is not intended to diagnose MRSA infection nor to guide or monitor treatment for MRSA infections. Performed at [facility], [address] [place], [place] [address]          Radiology Studies: No results found.      Scheduled Meds: . amLODipine  5 mg Oral Daily  . atenolol  50 mg Oral Daily   And  . chlorthalidone  25 mg Oral Daily  . benazepril  20 mg Oral Daily  . chlorhexidine  15 mL Mouth Rinse BID  . Chlorhexidine Gluconate Cloth  6 each Topical Daily  . collagenase  1 application Topical BID  . doxazosin  8 mg Oral Daily  . enoxaparin (LOVENOX) injection  40 mg Subcutaneous Q24H  . famotidine  20 mg Oral BID  . fentaNYL      . gabapentin  800 mg Oral QID  .  insulin aspart  0-15 Units Subcutaneous TID WC  . insulin aspart  0-5 Units Subcutaneous QHS  . mouth rinse  15 mL Mouth Rinse q12n4p  . multivitamin with minerals  1 tablet Oral Daily  . nutrition supplement (JUVEN)  1 packet Oral BID BM  . sodium chloride flush  10-40 mL Intracatheter Q12H  . traZODone  100 mg Oral QHS   Continuous Infusions:   LOS: 12 days    Time spent: 35    Delaine Lame, MD [facility]  If 7PM-7AM, please contact night-coverage [email or web address] Password [email or web address] 12/25/2017, 1:09 PM

## 2017-12-25 NOTE — Anesthesia Postprocedure Evaluation (Signed)
Anesthesia Post Note  Patient: Tish FredericksonCharles W Frenkel  Procedure(s) Performed: Irrigation and debridement of Fournier's of scrotum with placement of testes in subcutaneous thigh pockets and Acell placement (N/A Scrotum) APPLICATION OF A-CELL (N/A )     Patient location during evaluation: PACU Anesthesia Type: General Level of consciousness: awake and alert Pain management: pain level controlled Vital Signs Assessment: post-procedure vital signs reviewed and stable Respiratory status: spontaneous breathing, nonlabored ventilation, respiratory function stable and patient connected to nasal cannula oxygen Cardiovascular status: blood pressure returned to baseline and stable Postop Assessment: no apparent nausea or vomiting Anesthetic complications: no    Last Vitals:  Vitals:   12/25/17 1223 12/25/17 1336  BP: (!) 98/57 (!) 91/56  Pulse: 66 67  Resp: 14 14  Temp: 36.7 C 37.3 C  SpO2: 99% 97%    Last Pain:  Vitals:   12/25/17 1336  TempSrc: Oral  PainSc:                  Phillips Groutarignan, Endrit Gittins

## 2017-12-25 NOTE — Anesthesia Preprocedure Evaluation (Signed)
Anesthesia Evaluation  Patient identified by MRN, date of birth, ID band Patient awake    Reviewed: Allergy & Precautions, NPO status , Patient's Chart, lab work & pertinent test results  Airway Mallampati: III  TM Distance: <3 FB Neck ROM: Limited    Dental no notable dental hx. (+) Poor Dentition, Loose, Missing   Pulmonary shortness of breath, at rest and lying, COPD,  COPD inhaler, Current Smoker,   O2 saturation 79% on 6L    + decreased breath sounds+ wheezing unstable     Cardiovascular hypertension, Pt. on medications negative cardio ROS Normal cardiovascular exam Rhythm:Regular Rate:Normal     Neuro/Psych Anxiety negative neurological ROS     GI/Hepatic Neg liver ROS, GERD  Medicated,  Endo/Other  diabetes, Poorly Controlleduntreated  Renal/GU Renal InsufficiencyRenal disease  negative genitourinary   Musculoskeletal negative musculoskeletal ROS (+)   Abdominal   Peds negative pediatric ROS (+)  Hematology negative hematology ROS (+)   Anesthesia Other Findings   Reproductive/Obstetrics negative OB ROS                             Anesthesia Physical  Anesthesia Plan  ASA: III  Anesthesia Plan: General   Post-op Pain Management:    Induction: Intravenous  PONV Risk Score and Plan: 1 and Ondansetron  Airway Management Planned: Oral ETT and LMA  Additional Equipment:   Intra-op Plan:   Post-operative Plan: Extubation in OR  Informed Consent: I have reviewed the patients History and Physical, chart, labs and discussed the procedure including the risks, benefits and alternatives for the proposed anesthesia with the patient or authorized representative who has indicated his/her understanding and acceptance.   Dental advisory given  Plan Discussed with: CRNA and Surgeon  Anesthesia Plan Comments:         Anesthesia Quick Evaluation

## 2017-12-25 NOTE — Transfer of Care (Signed)
Immediate Anesthesia Transfer of Care Note  Patient: Tish FredericksonCharles W Sutch  Procedure(s) Performed: Irrigation and debridement of Fournier's of scrotum with placement of testes in subcutaneous thigh pockets and Acell placement (N/A Scrotum) APPLICATION OF A-CELL (N/A )  Patient Location: PACU  Anesthesia Type:General  Level of Consciousness: awake and patient cooperative  Airway & Oxygen Therapy: Patient Spontanous Breathing and Patient connected to face mask oxygen  Post-op Assessment: Report given to RN and Post -op Vital signs reviewed and stable  Post vital signs: Reviewed and stable  Last Vitals:  Vitals Value Taken Time  BP    Temp    Pulse    Resp    SpO2      Last Pain:  Vitals:   12/25/17 0555  TempSrc: Oral  PainSc:       Patients Stated Pain Goal: 3 (12/24/17 1128)  Complications: No apparent anesthesia complications

## 2017-12-26 ENCOUNTER — Encounter (HOSPITAL_COMMUNITY): Payer: Self-pay | Admitting: Plastic Surgery

## 2017-12-26 LAB — GLUCOSE, CAPILLARY
GLUCOSE-CAPILLARY: 211 mg/dL — AB (ref 65–99)
GLUCOSE-CAPILLARY: 148 mg/dL — AB (ref 65–99)
GLUCOSE-CAPILLARY: 167 mg/dL — AB (ref 65–99)
GLUCOSE-CAPILLARY: 174 mg/dL — AB (ref 65–99)

## 2017-12-26 NOTE — Progress Notes (Signed)
PROGRESS NOTE    BARTLEY VUOLO  ZOX:[id number] DOB: 1951-01-04 DOA: 12/13/2017 PCP: Kirstie Peri, MD    Brief Narrative:   67 year old with past medical history relevant for hypertension, benign prostatic hypertrophy, untreated type 2 diabetes, COPD, neuropathy admitted from any pain with Fournier's gangrene status post scrotal debridement/total scrotectomy on 12/14/2017 with course complicated by sepsis with ARDS and a KI status post extubation on 12/19/2017.  He has completed surgical treatments for his Fournier's gangrene and is pending placement.   Assessment & Plan:   Principal Problem:   Fournier gangrene Active Problems:   HTN (hypertension)   COPD (chronic obstructive pulmonary disease) (HCC)   #) Septic shock complicated by ARDS and AK I: Resolved, patient extubated on 12/18/2017.  #) Fournier's gangrene: Status post debridement on 12/14/2017. Surgical culture from 12/13/2017 shows normal skin flora including moderate enterococcus prevotii, moderate anaerobic GNR, beta-lactamase positive.  Patient was on vancomycin, Zosyn, clindamycin continued from 12/13/2017 to 14 2019. -Patient taken to the OR on 12/25/2017 with bilateral thigh flaps to cover the testicles and excision of tissue -Continue protein supplements, multivitamin, vitamin C, zinc for wound healing -Plan is to discharge to SNF or home with daily dressing changes of Bactroban and Xeroform per plastic surgery - Scrotal ultrasound with Dopplers on 12/25/2017 showed no evidence of torsion  #) Hypertension:  -Continue amlodipine 5 mg daily -Continue atenolol 50 mg daily -Continue chlorthalidone 25 mg daily -Continue benazepril 20 mg daily -PRN hydralazine  #) Type 2 diabetes: Patient was diagnosed during this hospitalization with type 2 diabetes.  His A1c is 7.4.  -Sliding scale insulin, before meals at bedtime  -start oral hypoglycemics on discharge  #) Neuropathy: -Continue gabapentin 800 mg 4 times daily  #) COPD: No  PFTs in system, unknown Gold stage - PRN bronchodilators  Fluids: Tolerating p.o. Electrolytes: Monitor and supplement Nutrition: Regular diet, supplements per above  Prophylaxis: SCDs  Disposition: Pending SNF placement  Full code    Consultants:   Plastic surgery  Urology  PC CM  Procedures: (Don't include imaging studies which can be auto populated. Include things that cannot be auto populated i.e. Echo, Carotid and venous dopplers, Foley, Bipap, HD, tubes/drains, wound vac, central lines etc) 12/14/2017 echo: - Left ventricle: The cavity size was normal. Wall thickness was   normal. The estimated ejection fraction was 55%. Wall motion was   normal; there were no regional wall motion abnormalities.   Features are consistent with a pseudonormal left ventricular   filling pattern, with concomitant abnormal relaxation and   increased filling pressure (grade 2 diastolic dysfunction). - Aortic valve: There was no stenosis. - Mitral valve: Mildly calcified annulus. Mildly calcified leaflets   . There was no significant regurgitation. - Right ventricle: The cavity size was mildly dilated. Systolic   function was normal. - Tricuspid valve: Peak RV-RA gradient (S): 30 mm Hg. - Pulmonary arteries: PA peak pressure: 45 mm Hg (S). - Systemic veins: IVC measured 2.3 cm with < 50% respirophasic   variation, suggesting RA pressure 15 mmHg.   12/13/2017 excision of scrotum and debridement of penis   Intubated 12/14/2017 to 12/18/2017   Scrotal ultrasound with Dopplers on 12/25/2017: Some fluid is noted surrounding the testicles bilaterally likely related to the recent operative procedure. Normal waveforms are  noted in both testicles.  Antimicrobials: (specify start and planned stop date. Auto populated tables are space occupying and do not give end dates)  IV Zosyn, clindamycin, vancomycin 12/13/2017 12/20/2017   Subjective:  Patient reports he is doing well.  He is quite tired  this morning.  He could not work with PT as he is quite sleepy. Objective: Vitals:   12/25/17 2207 12/26/17 0227 12/26/17 0524 12/26/17 0957  BP: 105/61 (!) 106/53 106/60 (!) 109/92  Pulse: 73 72 75 82  Resp: 18 18 18 17   Temp: 98.6 F (37 C) 98.4 F (36.9 C) 98.1 F (36.7 C) 98.9 F (37.2 C)  TempSrc: Oral Oral Oral   SpO2: 98% 98% 97% 96%  Weight:   81.9 kg (180 lb 8.9 oz)   Height:        Intake/Output Summary (Last 24 hours) at 12/26/2017 1333 Last data filed at 12/26/2017 1149 Gross per 24 hour  Intake 1377 ml  Output 3805 ml  Net -2428 ml   Filed Weights   12/24/17 0520 12/25/17 0555 12/26/17 0524  Weight: 83 kg (182 lb 15.7 oz) 84.6 kg (186 lb 8.2 oz) 81.9 kg (180 lb 8.9 oz)    Examination:  General exam: Appears calm and comfortable  Respiratory system: Clear to auscultation. Respiratory effort normal. Cardiovascular system: Regular rate and rhythm, no murmurs,  Gastrointestinal system: Soft, nondistended, no organomegaly, plus bowel sounds Central nervous system: Alert and oriented. No focal neurological deficits. Extremities: Trace lower extremity edema Skin: Perineum and testicles are dressed Psychiatry: Judgement and insight appear normal. Mood & affect appropriate.     Data Reviewed: I have personally reviewed following labs and imaging studies  CBC: Recent Labs  Lab 12/21/17 0451 12/22/17 0458 12/23/17 0409 12/25/17 0412  WBC 18.2* 17.7* 19.8* 16.9*  NEUTROABS 14.3*  --   --   --   HGB 9.9* 9.6* 10.0* 10.0*  HCT 29.5* 28.0* 29.6*  29.2* 29.3*  MCV 87.5 86.7 88.4 89.3  PLT 347 322 370 432*   Basic Metabolic Panel: Recent Labs  Lab 12/20/17 1343 12/21/17 0451 12/22/17 0458 12/23/17 0409 12/24/17 0349  NA 141 140 136 138 135  K 2.9* 2.7* 3.2* 3.4* 4.1  CL 104 102 105 107 105  CO2 26 24 23  20* 20*  GLUCOSE 304* 183* 178* 135* 179*  BUN 26* 21* 16 14 19   CREATININE 0.94 0.85 0.93 0.97 1.13  CALCIUM 8.1* 8.3* 8.2* 8.5* 8.4*  MG  --    --  1.9  --   --    GFR: Estimated Creatinine Clearance: 62.2 mL/min (by C-G formula based on SCr of 1.13 mg/dL). Liver Function Tests: Recent Labs  Lab 12/22/17 0458  AST 21  ALT 39  ALKPHOS 136*  BILITOT 0.8  PROT 5.5*  ALBUMIN 2.1*   No results for input(s): LIPASE, AMYLASE in the last 168 hours. No results for input(s): AMMONIA in the last 168 hours. Coagulation Profile: No results for input(s): INR, PROTIME in the last 168 hours. Cardiac Enzymes: No results for input(s): CKTOTAL, CKMB, CKMBINDEX, TROPONINI in the last 168 hours. BNP (last 3 results) No results for input(s): PROBNP in the last 8760 hours. HbA1C: No results for input(s): HGBA1C in the last 72 hours. CBG: Recent Labs  Lab 12/25/17 1102 12/25/17 1703 12/25/17 2203 12/26/17 0754 12/26/17 1157  GLUCAP 179* 157* 150* 211* 148*   Lipid Profile: No results for input(s): CHOL, HDL, LDLCALC, TRIG, CHOLHDL, LDLDIRECT in the last 72 hours. Thyroid Function Tests: No results for input(s): TSH, T4TOTAL, FREET4, T3FREE, THYROIDAB in the last 72 hours. Anemia Panel: No results for input(s): VITAMINB12, FOLATE, FERRITIN, TIBC, IRON, RETICCTPCT in the last 72 hours. Sepsis Labs:  No results for input(s): PROCALCITON, LATICACIDVEN in the last 168 hours.  Recent Results (from the past 240 hour(s))  MRSA PCR Screening     Status: None   Collection Time: 12/25/17  6:53 AM  Result Value Ref Range Status   MRSA by PCR NEGATIVE NEGATIVE Final    Comment:        The GeneXpert MRSA Assay (FDA approved for NASAL specimens only), is one component of a comprehensive MRSA colonization surveillance program. It is not intended to diagnose MRSA infection nor to guide or monitor treatment for MRSA infections. Performed at [facility], [address] [place], [place] [address]          Radiology Studies: US Scrotum  Result Date: 12/25/2017 CLINICAL DATA:  Scrotal pain, recent scrotal removal  for treatment of Fournier's gangrene EXAM: SCROTAL ULTRASOUND DOPPLER ULTRASOUND OF THE TESTICLES TECHNIQUE: Complete ultrasound examination of the testicles, epididymis, and other scrotal structures was performed. Color and spectral Doppler ultrasound were also utilized to evaluate blood flow to the testicles. COMPARISON:  12/13/2017 FINDINGS: Right testicle Measurements: 2.8 x 2.3 x 2.9 cm. No mass or microlithiasis visualized. Left testicle Measurements: 3.4 x 1.9 x 2.9 cm. No mass or microlithiasis visualized. Right epididymis:  Not well visualized Left epididymis:  Not well visualized Hydrocele:  Not applicable Varicocele:  No varicocele is noted. Pulsed Doppler interrogation of both testes demonstrates normal low resistance arterial and venous waveforms bilaterally. Hypoechoic tissue is noted surrounding both testicles likely representing some postoperative fluid. IMPRESSION: Some fluid is noted surrounding the testicles bilaterally likely related to the recent operative procedure. Normal waveforms are noted in both testicles. Electronically Signed   By: Alcide Clever M.D.   On: 12/25/2017 17:28   US Scrotum Doppler  Result Date: 12/25/2017 CLINICAL DATA:  Scrotal pain, recent scrotal removal for treatment of Fournier's gangrene EXAM: SCROTAL ULTRASOUND DOPPLER ULTRASOUND OF THE TESTICLES TECHNIQUE: Complete ultrasound examination of the testicles, epididymis, and other scrotal structures was performed. Color and spectral Doppler ultrasound were also utilized to evaluate blood flow to the testicles. COMPARISON:  12/13/2017 FINDINGS: Right testicle Measurements: 2.8 x 2.3 x 2.9 cm. No mass or microlithiasis visualized. Left testicle Measurements: 3.4 x 1.9 x 2.9 cm. No mass or microlithiasis visualized. Right epididymis:  Not well visualized Left epididymis:  Not well visualized Hydrocele:  Not applicable Varicocele:  No varicocele is noted. Pulsed Doppler interrogation of both testes demonstrates normal low  resistance arterial and venous waveforms bilaterally. Hypoechoic tissue is noted surrounding both testicles likely representing some postoperative fluid. IMPRESSION: Some fluid is noted surrounding the testicles bilaterally likely related to the recent operative procedure. Normal waveforms are noted in both testicles. Electronically Signed   By: Alcide Clever M.D.   On: 12/25/2017 17:28        Scheduled Meds: . amLODipine  5 mg Oral Daily  . atenolol  50 mg Oral Daily   And  . chlorthalidone  25 mg Oral Daily  . benazepril  20 mg Oral Daily  . chlorhexidine  15 mL Mouth Rinse BID  . Chlorhexidine Gluconate Cloth  6 each Topical Daily  . collagenase  1 application Topical BID  . doxazosin  8 mg Oral Daily  . enoxaparin (LOVENOX) injection  40 mg Subcutaneous Q24H  . famotidine  20 mg Oral BID  . gabapentin  800 mg Oral QID  . insulin aspart  0-15 Units Subcutaneous TID WC  . insulin aspart  0-5 Units Subcutaneous QHS  .  mouth rinse  15 mL Mouth Rinse q12n4p  . multivitamin with minerals  1 tablet Oral Daily  . nutrition supplement (JUVEN)  1 packet Oral BID BM  . sodium chloride flush  10-40 mL Intracatheter Q12H  . traZODone  100 mg Oral QHS   Continuous Infusions:   LOS: 13 days    Time spent: 35    Delaine Lame, MD [facility]  If 7PM-7AM, please contact night-coverage [email or web address] Password [email or web address] 12/26/2017, 1:33 PM

## 2017-12-26 NOTE — Progress Notes (Signed)
Subjective: The patient is sitting on the side of the bed.  No distress.  Feeling much better with the close wound.    Objective: Vital signs in last 24 hours: Temp:  [98 F (36.7 C)-99.2 F (37.3 C)] 98.9 F (37.2 C) (04/16 0957) Pulse Rate:  [66-82] 82 (04/16 0957) Resp:  [14-18] 17 (04/16 0957) BP: (91-109)/(53-92) 109/92 (04/16 0957) SpO2:  [96 %-100 %] 96 % (04/16 0957) Weight:  [81.9 kg (180 lb 8.9 oz)] 81.9 kg (180 lb 8.9 oz) (04/16 0524) Weight change: -2.7 kg (-5 lb 15.2 oz) Last BM Date: 12/26/17  Intake/Output from previous day: 04/15 0701 - 04/16 0700 In: 3647 [P.O.:1737; I.V.:1410; IV Piggyback:500] Out: 3330 [Urine:3070; Drains:160; Blood:100] Intake/Output this shift: No intake/output data recorded.  General appearance: alert, cooperative and no distress Incision/Wound: drain in place.  No seroma or hematoma noted.  Lab Results: Recent Labs    12/25/17 0412  WBC 16.9*  HGB 10.0*  HCT 29.3*  PLT 432*   BMET Recent Labs    12/24/17 0349  NA 135  K 4.1  CL 105  CO2 20*  GLUCOSE 179*  BUN 19  CREATININE 1.13  CALCIUM 8.4*    Studies/Results: [place] Scrotum  Result Date: 12/25/2017 CLINICAL DATA:  Scrotal pain, recent scrotal removal for treatment of Fournier's gangrene EXAM: SCROTAL ULTRASOUND DOPPLER ULTRASOUND OF THE TESTICLES TECHNIQUE: Complete ultrasound examination of the testicles, epididymis, and other scrotal structures was performed. Color and spectral Doppler ultrasound were also utilized to evaluate blood flow to the testicles. COMPARISON:  12/13/2017 FINDINGS: Right testicle Measurements: 2.8 x 2.3 x 2.9 cm. No mass or microlithiasis visualized. Left testicle Measurements: 3.4 x 1.9 x 2.9 cm. No mass or microlithiasis visualized. Right epididymis:  Not well visualized Left epididymis:  Not well visualized Hydrocele:  Not applicable Varicocele:  No varicocele is noted. Pulsed Doppler interrogation of both testes demonstrates normal low  resistance arterial and venous waveforms bilaterally. Hypoechoic tissue is noted surrounding both testicles likely representing some postoperative fluid. IMPRESSION: Some fluid is noted surrounding the testicles bilaterally likely related to the recent operative procedure. Normal waveforms are noted in both testicles. Electronically Signed   By: Alcide CleverMark  Lukens M.D.   On: 12/25/2017 17:28   [place] Scrotum Doppler  Result Date: 12/25/2017 CLINICAL DATA:  Scrotal pain, recent scrotal removal for treatment of Fournier's gangrene EXAM: SCROTAL ULTRASOUND DOPPLER ULTRASOUND OF THE TESTICLES TECHNIQUE: Complete ultrasound examination of the testicles, epididymis, and other scrotal structures was performed. Color and spectral Doppler ultrasound were also utilized to evaluate blood flow to the testicles. COMPARISON:  12/13/2017 FINDINGS: Right testicle Measurements: 2.8 x 2.3 x 2.9 cm. No mass or microlithiasis visualized. Left testicle Measurements: 3.4 x 1.9 x 2.9 cm. No mass or microlithiasis visualized. Right epididymis:  Not well visualized Left epididymis:  Not well visualized Hydrocele:  Not applicable Varicocele:  No varicocele is noted. Pulsed Doppler interrogation of both testes demonstrates normal low resistance arterial and venous waveforms bilaterally. Hypoechoic tissue is noted surrounding both testicles likely representing some postoperative fluid. IMPRESSION: Some fluid is noted surrounding the testicles bilaterally likely related to the recent operative procedure. Normal waveforms are noted in both testicles. Electronically Signed   By: Alcide CleverMark  Lukens M.D.   On: 12/25/2017 17:28    Medications: I have reviewed the patient's current medications.  Assessment/Plan: Ready for discharge from a wound stand point.  Follow up in one week.  Daily drain care. May wash and keep area clean.  Bactroban and  xeroform to the area daily.  LOS: 13 days    Peggye Form 12/26/2017

## 2017-12-26 NOTE — Progress Notes (Signed)
MD notified about low blood pressure of 109/70.  Order given to hold blood pressure medications today.

## 2017-12-27 LAB — CBC
HCT: 28.8 % — ABNORMAL LOW (ref 39.0–52.0)
Hemoglobin: 9.9 g/dL — ABNORMAL LOW (ref 13.0–17.0)
MCH: 30.7 pg (ref 26.0–34.0)
MCHC: 34.4 g/dL (ref 30.0–36.0)
MCV: 89.4 fL (ref 78.0–100.0)
Platelets: 445 K/uL — ABNORMAL HIGH (ref 150–400)
RBC: 3.22 MIL/uL — ABNORMAL LOW (ref 4.22–5.81)
RDW: 16.6 % — ABNORMAL HIGH (ref 11.5–15.5)
WBC: 11.4 K/uL — ABNORMAL HIGH (ref 4.0–10.5)

## 2017-12-27 LAB — BASIC METABOLIC PANEL
Anion gap: 13 (ref 5–15)
CO2: 19 mmol/L — ABNORMAL LOW (ref 22–32)
Chloride: 102 mmol/L (ref 101–111)
Creatinine, Ser: 1.24 mg/dL (ref 0.61–1.24)
GFR calc Af Amer: >60 mL/min (ref >60)
Potassium: 3.4 mmol/L — ABNORMAL LOW (ref 3.5–5.1)

## 2017-12-27 LAB — GLUCOSE, CAPILLARY
GLUCOSE-CAPILLARY: 230 mg/dL — AB (ref 65–99)
Glucose-Capillary: 187 mg/dL — ABNORMAL HIGH (ref 65–99)
Glucose-Capillary: 226 mg/dL — ABNORMAL HIGH (ref 65–99)
Glucose-Capillary: 192 mg/dL — ABNORMAL HIGH (ref 65–99)

## 2017-12-27 LAB — BASIC METABOLIC PANEL WITH GFR
BUN: 27 mg/dL — ABNORMAL HIGH (ref 6–20)
Calcium: 9 mg/dL (ref 8.9–10.3)
GFR calc non Af Amer: 59 mL/min — ABNORMAL LOW (ref >60)
Glucose, Bld: 189 mg/dL — ABNORMAL HIGH (ref 65–99)
Sodium: 134 mmol/L — ABNORMAL LOW (ref 135–145)

## 2017-12-27 LAB — MAGNESIUM: Magnesium: 1.8 mg/dL (ref 1.7–2.4)

## 2017-12-27 MED ORDER — OXYCODONE HCL 5 MG PO TABS
5.0000 mg | ORAL_TABLET | Freq: Four times a day (QID) | ORAL | Status: DC
  Administered 2017-12-27 – 2017-12-28 (×4): 5 mg via ORAL
  Filled 2017-12-27 (×4): qty 1

## 2017-12-27 MED ORDER — OXYCODONE-ACETAMINOPHEN 10-325 MG PO TABS: 1.0000 | ORAL_TABLET | Freq: Four times a day (QID) | ORAL | Status: DC

## 2017-12-27 MED ORDER — OXYCODONE-ACETAMINOPHEN 5-325 MG PO TABS
1.0000 | ORAL_TABLET | Freq: Four times a day (QID) | ORAL | Status: DC
  Administered 2017-12-27 – 2017-12-28 (×4): 1 via ORAL
  Filled 2017-12-27 (×4): qty 1

## 2017-12-27 NOTE — Progress Notes (Signed)
CSWs following for assistance with disposition- planning for pt to admit to SNF for ST rehab at [facility]. Pt selected [facility] facility began insurance auth 12/25/17. CSW left voicemail for admission today for update.  Ilean SkillMeghan Lashara Urey, MSW, LCSW Clinical Social Work 12/27/2017 [phone]

## 2017-12-27 NOTE — Progress Notes (Signed)
Occupational Therapy Treatment- Late entry for 4/16 Patient Details Name: Patrick FredericksonCharles W Aguilar MRN: [id number] DOB: 03/31/1951 Today's Date: 12/27/2017    History of present illness 67 y/o male with septic shock from fournier's gangrene complicated by ARDS, AKI.  He has COPD, GERD, HTN, anxiety.  He has completed surgical treatments for his Fournier's gangrene .      Follow Up Recommendations  Home health OT;Supervision/Assistance - 24 hour(DEPENDING ON PROGRESS)    Equipment Recommendations  3 in 1 bedside commode    Recommendations for Other Services      Precautions / Restrictions Precautions Precautions: Fall       Mobility Bed Mobility Overal bed mobility: Needs Assistance Bed Mobility: Sit to Supine       Sit to supine: Min assist      Transfers Overall transfer level: Needs assistance   Transfers: Sit to/from Stand;Stand Pivot Transfers Sit to Stand: Min assist;Mod assist Stand pivot transfers: Min assist                ADL either performed or assessed with clinical judgement   ADL Overall ADL's : Needs assistance/impaired     Grooming: Standing;Supervision/safety                   Toilet Transfer: Minimal assistance;RW;Ambulation;Cueing for sequencing;Cueing for safety;Comfort height toilet   Toileting- Clothing Manipulation and Hygiene: Minimal assistance;Sit to/from stand;Cueing for safety;Cueing for sequencing         General ADL Comments: pt had surgery 4/15 and is in much less pain               Cognition Arousal/Alertness: Awake/alert Behavior During Therapy: [facility]FL for tasks assessed/performed Overall Cognitive Status: Within Functional Limits for tasks assessed                                           Prior Functioning/Environment              Frequency  Min 2X/week        Progress Toward Goals  OT Goals(current goals can now be found in the care plan section)  Progress towards OT goals:  Progressing toward goals     Plan Discharge plan needs to be updated       AM-PAC PT "6 Clicks" Daily Activity     Outcome Measure   Help from another person eating meals?: None Help from another person taking care of personal grooming?: A Little Help from another person toileting, which includes using toliet, bedpan, or urinal?: A Lot Help from another person bathing (including washing, rinsing, drying)?: A Lot Help from another person to put on and taking off regular upper body clothing?: A Little Help from another person to put on and taking off regular lower body clothing?: Total 6 Click Score: 15    End of Session Equipment Utilized During Treatment: Rolling walker  OT Visit Diagnosis: Unsteadiness on feet (R26.81);Muscle weakness (generalized) (M62.81)   Activity Tolerance Patient tolerated treatment well   Patient Left Other (comment)([facility])   Nurse Communication Mobility status         [place]ori Sherma [place], [place] [phone]   Alba CoryREDDING, Aziza Stuckert D 12/27/2017, 8:34 AM

## 2017-12-27 NOTE — Progress Notes (Addendum)
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  #) Fournier's gangrene:  Status post debridement on 12/14/2017. Surgical culture from 12/13/2017 shows normal skin flora including moderate enterococcus prevotii, moderate anaerobic GNR, beta-lactamase positive.  Patient was on vancomycin, Zosyn, clindamycin continued from 12/13/2017 to 14 2019. -Patient taken to the OR on 12/25/2017 with bilateral thigh flaps to cover the testicles and excision of tissue -Continue protein supplements, multivitamin, vitamin C, zinc for wound healing -drain care, daily dressing changes of Bactroban and Xeroform per plastic surgery - Scrotal ultrasound with Dopplers on 12/25/2017 showed no evidence of torsion -pain control, transition for iv dilaudid to oral oxycodone ( will schedule , patient has right to refuse scheduled oxycodone)  #) Hypertension:  -bp low normal , will hold amlodipine 5 mg daily -Continue atenolol 50 mg daily -Continue chlorthalidone 25 mg daily -Continue benazepril 20 mg daily Continue doxazosin daily -PRN hydralazine  #) Type 2 diabetes: Patient was diagnosed during this hospitalization with type 2 diabetes.  His A1c is 7.4.  -Sliding scale insulin, before meals at bedtime  -start oral hypoglycemics on discharge  #) Neuropathy: -Continue gabapentin 800 mg 4 times daily  #) COPD: No PFTs in system, unknown Gold stage - PRN bronchodilators -no wheezing, no cough, no  hypoxia  Normocytic anemia: hgb around 10 No acute blood loss pmd to continue monitor  -chronic back pain, on oxycodone 10/325 q6hr at home, will transition back to home dose. No issues with bm  Fluids: Tolerating p.o. Electrolytes: Monitor and supplement Nutrition: Regular diet, supplements per above  Prophylaxis: SCDs    Consultants:   Plastic surgery  Urology  PC CM     Code Status: full  Family Communication: patient , daughter over the phone  Disposition Plan: he declined snf placement, he elected to go to his daughter's home with home health   Consultants:  Plastic surgery  Urology  PC CM  Procedures:  12/13/2017 excision of scrotum and debridement of penis  Intubated 12/14/2017 to 12/18/2017  4/15 "Bilateral thigh flaps to cover the testicles with placement of 1 gm Acell powder. Excision of skin and subcutaneous tissue of the groin and scrotum 10 x 15 cm   Antibiotics:  IV Zosyn, clindamycin, vancomycin 12/13/2017 12/20/2017     Objective: BP 122/71 (BP Location: Right Arm)   Pulse 90   Temp 97.7 F (36.5 C) (Oral)   Resp 14   Ht 5\' 8"  (1.727 m)   Wt 82.5 kg (181 lb 14.1 oz)   SpO2 95%   BMI 27.65 kg/m   Intake/Output Summary (Last 24 hours) at 12/27/2017 1804 Last data filed at 12/27/2017 1500 Gross per 24 hour  Intake 300 ml  Output 1535 ml  Net -1235 ml   Filed Weights   12/25/17 0555 12/26/17 0524 12/27/17 0517  Weight: 84.6 kg (186 lb 8.2 oz) 81.9 kg (180 lb 8.9 oz) 82.5 kg (181 lb 14.1 oz)    Exam: Patient is examined daily including today on 12/27/2017, exams remain  the same as of yesterday except that has changed    General:  NAD  Cardiovascular: RRR  Respiratory: CTABL  Abdomen: Soft/ND/NT, positive BS  Musculoskeletal: No Edema  Neuro: alert, oriented   Urology: perineum,testicles dressed, drain in place  Data Reviewed: Basic Metabolic Panel: Recent Labs  Lab 12/21/17 0451 12/22/17 0458 12/23/17 0409  12/24/17 0349 12/27/17 0443  NA 140 136 138 135 134*  K 2.7* 3.2* 3.4* 4.1 3.4*  CL 102 105 107 105 102  CO2 24 23 20* 20* 19*  GLUCOSE 183* 178* 135* 179* 189*  BUN 21* 16 14 19  27*  CREATININE 0.85 0.93 0.97 1.13 1.24  CALCIUM 8.3* 8.2* 8.5* 8.4* 9.0  MG  --  1.9  --   --  1.8   Liver Function Tests: Recent Labs  Lab 12/22/17 0458  AST 21  ALT 39  ALKPHOS 136*  BILITOT 0.8  PROT 5.5*  ALBUMIN 2.1*   No results for input(s): LIPASE, AMYLASE in the last 168 hours. No results for input(s): AMMONIA in the last 168 hours. CBC: Recent Labs  Lab 12/21/17 0451 12/22/17 0458 12/23/17 0409 12/25/17 0412 12/27/17 0443  WBC 18.2* 17.7* 19.8* 16.9* 11.4*  NEUTROABS 14.3*  --   --   --   --   HGB 9.9* 9.6* 10.0* 10.0* 9.9*  HCT 29.5* 28.0* 29.6*  29.2* 29.3* 28.8*  MCV 87.5 86.7 88.4 89.3 89.4  PLT 347 322 370 432* 445*   Cardiac Enzymes:   No results for input(s): CKTOTAL, CKMB, CKMBINDEX, TROPONINI in the last 168 hours. BNP (last 3 results) No results for input(s): BNP in the last 8760 hours.  ProBNP (last 3 results) No results for input(s): PROBNP in the last 8760 hours.  CBG: Recent Labs  Lab 12/26/17 1647 12/26/17 2151 12/27/17 0736 12/27/17 1202 12/27/17 1712  GLUCAP 167* 174* 187* 226* 192*    Recent Results (from the past 240 hour(s))  MRSA PCR Screening     Status: None   Collection Time: 12/25/17  6:53 AM  Result Value Ref Range Status   MRSA by PCR NEGATIVE NEGATIVE Final    Comment:        The GeneXpert MRSA Assay (FDA approved for NASAL specimens only), is one component of a comprehensive MRSA colonization surveillance program. It is not intended to diagnose MRSA infection nor to guide or monitor treatment for MRSA infections. Performed at [facility], [address], [place], [place] [address]      Studies: No results found.  Scheduled Meds: . atenolol  50 mg Oral Daily   And  . chlorthalidone  25 mg Oral  Daily  . benazepril  20 mg Oral Daily  . chlorhexidine  15 mL Mouth Rinse BID  . Chlorhexidine Gluconate Cloth  6 each Topical Daily  . collagenase  1 application Topical BID  . doxazosin  8 mg Oral Daily  . enoxaparin (LOVENOX) injection  40 mg Subcutaneous Q24H  . famotidine  20 mg Oral BID  . gabapentin  800 mg Oral QID  . insulin aspart  0-15 Units Subcutaneous TID WC  . insulin aspart  0-5 Units Subcutaneous QHS  . mouth rinse  15 mL Mouth Rinse q12n4p  . multivitamin with minerals  1 tablet Oral Daily  . nutrition supplement (JUVEN)  1 packet Oral BID BM  . oxyCODONE-acetaminophen  1 tablet Oral Q6H   And  . oxyCODONE  5 mg Oral Q6H  . sodium chloride flush  10-40 mL Intracatheter Q12H  . traZODone  100 mg Oral QHS    Continuous Infusions:   Time spent: I have personally reviewed and interpreted on  12/27/2017 daily labs, tele strips, imagings as discussed above under date review session and assessment and plans.  I reviewed all nursing notes, pharmacy notes, consultant notes,  vitals, pertinent old records  I have discussed plan of care as described above with RN , patient and family on 12/27/2017   Albertine Grates MD, PhD  [facility] Pager [phone]. If 7PM-7AM, please contact night-coverage at [email or web address], password [facility] 12/27/2017, 6:04 PM  LOS: 14 days

## 2017-12-27 NOTE — Progress Notes (Addendum)
Physical Therapy Treatment Patient Details Name: Patrick Aguilar MRN: [id number] DOB: May 26, 1951 Today's Date: 12/27/2017    History of Present Illness 67 y/o male with septic shock from fournier's gangrene complicated by ARDS, AKI.  He has COPD, GERD, HTN, anxiety.  He has completed surgical treatments for his Fournier's gangrene .    PT Comments    Progressing with mobility. Moderate pain at surgical site, "raw" feeling along inner thighs. Pt is motivated to get better. Discussed d/c plan-he is planning to d/c to his daughter's home if at all possible. Recommend RW use for safe ambulation and continued PT.     Follow Up Recommendations  SNF (HHPT if pt decides to return home at discharge. Pt stated he prefers to d/c to his daughter's home)     Equipment Recommendations  Rolling walker with 5" wheels    Recommendations for Other Services       Precautions / Restrictions Precautions Precautions: Fall Precaution Comments: I and D scrotum. L jp drain. leakage from surgical site Restrictions Weight Bearing Restrictions: No    Mobility  Bed Mobility Overal bed mobility: Needs Assistance Bed Mobility: Supine to Sit;Sit to Supine     Supine to sit: Supervision Sit to supine: Supervision      Transfers Overall transfer level: Needs assistance Equipment used: Rolling walker (2 wheeled) Transfers: Sit to/from Stand Sit to Stand: Min guard;From elevated surface        General transfer comment: close guard for safety. Pt pulled up on walker. Cues for safety, hand placement  Ambulation/Gait Ambulation/Gait assistance: Min assist Ambulation Distance (Feet): 50 Feet(x2) Assistive device: Rolling walker (2 wheeled) Gait Pattern/deviations: Step-through pattern;Decreased stride length;Trunk flexed     General Gait Details: Increased lateral sway in L and R directions. Unsteady. Slow gait speed. Assist to stabilize intermittently. Dyspnea 2/4. Cues for safe RW use. Seated  rest breaks between walks.    Stairs             Wheelchair Mobility    Modified Rankin (Stroke Patients Only)       Balance Overall balance assessment: Needs assistance         Standing balance support: Bilateral upper extremity supported Standing balance-Leahy Scale: Poor                              Cognition Arousal/Alertness: Awake/alert Behavior During Therapy: WFL for tasks assessed/performed Overall Cognitive Status: Within Functional Limits for tasks assessed                                        Exercises      General Comments        Pertinent Vitals/Pain Pain Assessment: 0-10 Pain Score: 5  Pain Location: scrotum, buttocks, inner thighs Pain Descriptors / Indicators: Tender;Burning Pain Intervention(s): Monitored during session;Repositioned    Home Living                      Prior Function            PT Goals (current goals can now be found in the care plan section) Progress towards PT goals: Progressing toward goals    Frequency    Min 3X/week      PT Plan Current plan remains appropriate    Co-evaluation  AM-PAC PT "6 Clicks" Daily Activity  Outcome Measure  Difficulty turning over in bed (including adjusting bedclothes, sheets and blankets)?: A Lot Difficulty moving from lying on back to sitting on the side of the bed? : A Lot Difficulty sitting down on and standing up from a chair with arms (e.g., wheelchair, bedside commode, etc,.)?: A Lot Help needed moving to and from a bed to chair (including a wheelchair)?: A Little Help needed walking in hospital room?: A Little Help needed climbing 3-5 steps with a railing? : A Lot 6 Click Score: 14    End of Session Equipment Utilized During Treatment: Gait belt Activity Tolerance: Patient limited by pain;Patient limited by fatigue Patient left: in bed;with call bell/phone within reach;with bed alarm set   PT Visit  Diagnosis: Muscle weakness (generalized) (M62.81);Difficulty in walking, not elsewhere classified (R26.2);Unsteadiness on feet (R26.81);Pain;Other abnormalities of gait and mobility (R26.89) Pain - part of body: (groin, buttocks, inner thighs)     Time: [id number] PT Time Calculation (min) (ACUTE ONLY): 18 min  Charges:  $Gait Training: 8-22 mins                    G Codes:          Rebeca AlertJannie Brooklee Michelin, MPT Pager: [phone]

## 2017-12-27 NOTE — Progress Notes (Signed)
Occupational Therapy Treatment Patient Details Name: Patrick Aguilar MRN: [id number] DOB: 09/29/1950 Today's Date: 12/27/2017    History of present illness 66 y/o male with septic shock from fournier's gangrene complicated by ARDS, AKI.  He has COPD, GERD, HTN, anxiety.  He has completed surgical treatments for his Fournier's gangrene .   OT comments  Goal added for BUE exercise with theraband  Follow Up Recommendations  Home health OT;Supervision/Assistance - 24 hour(DEPENDING ON PROGRESS)    Equipment Recommendations  3 in 1 bedside commode    Recommendations for Other Services      Precautions / Restrictions Precautions Precautions: Fall Precaution Comments: I and D scrotum. L jp drain. leakage from surgical site Restrictions Weight Bearing Restrictions: No       Mobility Bed Mobility   Bed Mobility: Supine to Sit;Sit to Supine     Supine to sit: Supervision Sit to supine: Supervision      Transfers Overall transfer level: Needs assistance Equipment used: Rolling walker (2 wheeled) Transfers: Sit to/from Stand Sit to Stand: Min guard;From elevated surface         General transfer comment: close guard for safety. Pt pulled up on walker. Cues for safety, hand placement    Balance Overall balance assessment: Needs assistance         Standing balance support: Bilateral upper extremity supported Standing balance-Leahy Scale: Poor                             ADL either performed or assessed with clinical judgement   ADL                                         General ADL Comments: pt back in bed. Pt agreeable to BUE Exercise as he states he feels like his arms have gotten weak,  Orange theraband issued and goal added     Vision Baseline Vision/History: No visual deficits     Perception     Praxis      Cognition Arousal/Alertness: Awake/alert Behavior During Therapy: WFL for tasks assessed/performed Overall  Cognitive Status: Within Functional Limits for tasks assessed                                          Exercises General Exercises - Upper Extremity Shoulder Flexion: AROM;Theraband;20 reps;Supine Theraband Level (Shoulder Flexion): Level 2 (Red) Elbow Flexion: AROM;Both;10 reps;Strengthening;Supine Elbow Extension: AROM;Both;10 reps;Strengthening;Supine   Shoulder Instructions       General Comments      Pertinent Vitals/ Pain       Pain Assessment: 0-10 Pain Score: 4  Pain Location: scrotum, buttocks, inner thighs Pain Descriptors / Indicators: Burning Pain Intervention(s): Premedicated before session;Repositioned  Home Living                                          Prior Functioning/Environment              Frequency  Min 2X/week        Progress Toward Goals  OT Goals(current goals can now be found in the care plan section)  Progress towards OT goals: Progressing toward goals(goal  added for BUE HEP)     Plan Discharge plan needs to be updated    Co-evaluation                 AM-PAC PT "6 Clicks" Daily Activity     Outcome Measure   Help from another person eating meals?: None Help from another person taking care of personal grooming?: A Little Help from another person toileting, which includes using toliet, bedpan, or urinal?: A Lot Help from another person bathing (including washing, rinsing, drying)?: A Lot Help from another person to put on and taking off regular upper body clothing?: A Little Help from another person to put on and taking off regular lower body clothing?: A Lot 6 Click Score: 16    End of Session Equipment Utilized During Treatment: Rolling walker  OT Visit Diagnosis: Unsteadiness on feet (R26.81);Muscle weakness (generalized) (M62.81)   Activity Tolerance Patient tolerated treatment well   Patient Left Other (comment[facility])   Nurse Communication Mobility status         Time: [id number] OT Time Calculation (min): 16 min  Charges: OT General Charges $OT Visit: 1 Visit OT Treatments $Therapeutic Exercise: 8-22 mins  [place]ori [place], [place] [phone]   Alba CoryREDDING, Laresa Oshiro D 12/27/2017, 2:13 PM

## 2017-12-28 LAB — BASIC METABOLIC PANEL
ANION GAP: 11 (ref 5–15)
BUN: 37 mg/dL — ABNORMAL HIGH (ref 6–20)
CALCIUM: 8.8 mg/dL — AB (ref 8.9–10.3)
CO2: 22 mmol/L (ref 22–32)
Chloride: 102 mmol/L (ref 101–111)
Creatinine, Ser: 1.29 mg/dL — ABNORMAL HIGH (ref 0.61–1.24)
GFR, EST NON AFRICAN AMERICAN: 56 mL/min — AB (ref >60)
Glucose, Bld: 190 mg/dL — ABNORMAL HIGH (ref 65–99)
POTASSIUM: 3.6 mmol/L (ref 3.5–5.1)
SODIUM: 135 mmol/L (ref 135–145)

## 2017-12-28 LAB — CBC WITH DIFFERENTIAL/PLATELET
BASOS ABS: 0.1 10*3/uL (ref 0.0–0.1)
BASOS PCT: 1 %
EOS ABS: 0.7 10*3/uL (ref 0.0–0.7)
Eosinophils Relative: 6 %
HCT: 27.3 % — ABNORMAL LOW (ref 39.0–52.0)
Hemoglobin: 9.3 g/dL — ABNORMAL LOW (ref 13.0–17.0)
LYMPHS PCT: 17 %
Lymphs Abs: 1.9 10*3/uL (ref 0.7–4.0)
MCH: 30.8 pg (ref 26.0–34.0)
MCHC: 34.1 g/dL (ref 30.0–36.0)
MCV: 90.4 fL (ref 78.0–100.0)
Monocytes Absolute: 1.1 10*3/uL — ABNORMAL HIGH (ref 0.1–1.0)
Monocytes Relative: 10 %
Neutro Abs: 7.1 10*3/uL (ref 1.7–7.7)
Neutrophils Relative %: 66 %
PLATELETS: 467 10*3/uL — AB (ref 150–400)
RBC: 3.02 MIL/uL — AB (ref 4.22–5.81)
RDW: 16.6 % — ABNORMAL HIGH (ref 11.5–15.5)
WBC: 10.8 10*3/uL — AB (ref 4.0–10.5)

## 2017-12-28 LAB — GLUCOSE, CAPILLARY
GLUCOSE-CAPILLARY: 196 mg/dL — AB (ref 65–99)
GLUCOSE-CAPILLARY: 209 mg/dL — AB (ref 65–99)

## 2017-12-28 LAB — MAGNESIUM: MAGNESIUM: 1.8 mg/dL (ref 1.7–2.4)

## 2017-12-28 MED ORDER — OXYCODONE-ACETAMINOPHEN 10-325 MG PO TABS: 1.0000 | ORAL_TABLET | Freq: Four times a day (QID) | ORAL | 0 refills | Status: AC | PRN

## 2017-12-28 MED ORDER — BLOOD GLUCOSE MONITOR KIT: PACK | 0 refills | Status: AC

## 2017-12-28 MED ORDER — JUVEN PO PACK: 1.0000 | PACK | Freq: Two times a day (BID) | ORAL | 0 refills | Status: DC

## 2017-12-28 MED ORDER — MUPIROCIN 2 % EX OINT
TOPICAL_OINTMENT | CUTANEOUS | Status: AC
  Administered 2017-12-28: 12:00:00
  Filled 2017-12-28: qty 22

## 2017-12-28 MED ORDER — METFORMIN HCL 500 MG PO TABS: 500.0000 mg | ORAL_TABLET | Freq: Two times a day (BID) | ORAL | 11 refills | Status: DC

## 2017-12-28 MED ORDER — COLLAGENASE 250 UNIT/GM EX OINT: 1.0000 "application " | TOPICAL_OINTMENT | Freq: Two times a day (BID) | CUTANEOUS | 0 refills | Status: DC

## 2017-12-28 MED ORDER — ADULT MULTIVITAMIN W/MINERALS CH: 1.0000 | ORAL_TABLET | Freq: Every day | ORAL | 0 refills | Status: DC

## 2017-12-28 NOTE — Progress Notes (Signed)
Discharge instructions reviewed with patient. All questions answered. Wound and drain care reviewed with patient and daughter. All questions answered and supplies given. Patient wheeled to vehicle with belongings by nurse tech

## 2017-12-28 NOTE — Progress Notes (Signed)
Discharge planning, spoke with patient at beside. [facility], OT, RN to eval and treat. Contacted [facility] for referral. His daughter will assist with drain care and wound care. Needs a RW and 3-n-1 contacted [facility] to deliver to room. [phone]

## 2017-12-28 NOTE — Progress Notes (Signed)
Occupational Therapy Treatment Patient Details Name: Patrick Aguilar MRN: [id number] DOB: [birth year] Today's Date: 12/28/2017    History of present illness 67 y/o male with septic shock from fournier's gangrene complicated by ARDS, AKI.  He has COPD, GERD, HTN, anxiety.  He has completed surgical treatments for his Fournier's gangrene .   OT comments  Pt plans to DC home with daugther  Follow Up Recommendations  Home health OT    Equipment Recommendations  3 in 1 bedside commode    Recommendations for Other Services      Precautions / Restrictions Precautions Precautions: Fall Precaution Comments: I and D scrotum. L jp drain. leakage from surgical site/drain Restrictions Weight Bearing Restrictions: No       Mobility Bed Mobility   Bed Mobility: Supine to Sit;Sit to Supine     Supine to sit: Supervision Sit to supine: Supervision      Transfers Overall transfer level: Needs assistance Equipment used: Rolling walker (2 wheeled) Transfers: Sit to/from Stand Sit to Stand: From elevated surface;Supervision Stand pivot transfers: Supervision                ADL either performed or assessed with clinical judgement   ADL Overall ADL's : Needs assistance/impaired         Upper Body Bathing: Set up;Sitting   Lower Body Bathing: Sit to/from stand;Cueing for sequencing;Cueing for safety;With adaptive equipment;Minimal assistance Lower Body Bathing Details (indicate cue type and reason): instructed in use of reacher- Upper Body Dressing : Set up;Sitting   Lower Body Dressing: Minimal assistance;Sit to/from stand;With adaptive equipment Lower Body Dressing Details (indicate cue type and reason): with reacher Toilet Transfer: Minimal assistance;Comfort height toilet;RW   Toileting- Clothing Manipulation and Hygiene: Minimal assistance;Sit to/from stand;Cueing for safety;Cueing for sequencing         General ADL Comments: pt going home with daugther. 3 n 1  delivered      Vision Baseline Vision/History: No visual deficits     Perception     Praxis      Cognition Arousal/Alertness: Awake/alert Behavior During Therapy: WFL for tasks assessed/performed Overall Cognitive Status: Within Functional Limits for tasks assessed                                                     Pertinent Vitals/ Pain       Pain Score: 5  Pain Location: scrotum, buttocks, inner thighs Pain Descriptors / Indicators: Burning;Discomfort Pain Intervention(s): Monitored during session;Repositioned         Frequency  Min 2X/week        Progress Toward Goals  OT Goals(current goals can now be found in the care plan section)  Progress towards OT goals: Progressing toward goals     Plan Discharge plan remains appropriate    Co-evaluation                 AM-PAC PT "6 Clicks" Daily Activity     Outcome Measure   Help from another person eating meals?: None Help from another person taking care of personal grooming?: A Little Help from another person toileting, which includes using toliet, bedpan, or urinal?: A Little Help from another person bathing (including washing, rinsing, drying)?: A Lot Help from another person to put on and taking off regular upper body clothing?: A Little Help from another person  to put on and taking off regular lower body clothing?: A Little 6 Click Score: 18    End of Session Equipment Utilized During Treatment: Rolling walker  OT Visit Diagnosis: Unsteadiness on feet (R26.81);Muscle weakness (generalized) (M62.81)   Activity Tolerance Patient tolerated treatment well   Patient Left Other (comment[facility]   Nurse Communication Mobility status        Time: [id number] OT Time Calculation (min): 22 min  Charges: OT General Charges $OT Visit: 1 Visit OT Treatments $Self Care/Home Management : 8-22 mins  [place]ori Stir[place], [place] [phone]   Alba CoryREDDING, Shareeka Yim D 12/28/2017,  11:52 AM

## 2017-12-28 NOTE — Care Management Important Message (Signed)
Important Message  Patient Details  Name: Patrick Aguilar MRN: [id number] Date of Birth: [birth year]   Medicare Important Message Given:  Yes    Caren MacadamFuller, Marylee Belzer 12/28/2017, 11:42 AMImportant Message  Patient Details  Name: Patrick FredericksonCharles W Aguilar MRN: [id number] Date of Birth: [birth year]   Medicare Important Message Given:  Yes    Caren MacadamFuller, Treyon Wymore 12/28/2017, 11:42 AM

## 2017-12-28 NOTE — Progress Notes (Signed)
Discussed DC plan with pt this morning. [facility]. Today pt reports he is planning to go home to his daughter's house. States, "I can get around and to the bathroom and she can help me there, I'd prefer that to a rehab." States he plans to use a walker at home and does not foresee any other equipment needs.  Ilean SkillMeghan Georgette Helmer, MSW, LCSW Clinical Social Work 12/28/2017 [phone]

## 2017-12-28 NOTE — Progress Notes (Signed)
3 Days Post-Op Subjective: Patient reports  Feeling better w/ less pain. D/C today  Objective: Vital signs in last 24 hours: Temp:  [98.7 F (37.1 C)-98.8 F (37.1 C)] 98.7 F (37.1 C) (04/18 0436) Pulse Rate:  [70-72] 70 (04/18 0436) Resp:  [13-16] 13 (04/18 0436) BP: (94-112)/(55-68) 112/68 (04/18 1100) SpO2:  [94 %-96 %] 96 % (04/18 0436) Weight:  [82.6 kg (182 lb 1.6 oz)] 82.6 kg (182 lb 1.6 oz) (04/18 0436)  Intake/Output from previous day: 04/17 0701 - 04/18 0700 In: 1280 [P.O.:1280] Out: 1020 [Urine:850; Drains:170] Intake/Output this shift: Total I/O In: 240 [P.O.:240] Out: -   Physical Exam:  Constitutional: Vital signs reviewed. WD WN in NAD   Eyes: PERRL, No scleral icterus.   Cardiovascular: RRR Pulmonary/Chest: Normal effort Genitourinary: Perineal wound healing well. Extremities: No cyanosis or edema   Lab Results: Recent Labs    12/27/17 0443 12/28/17 0427  HGB 9.9* 9.3*  HCT 28.8* 27.3*   BMET Recent Labs    12/27/17 0443 12/28/17 0427  NA 134* 135  K 3.4* 3.6  CL 102 102  CO2 19* 22  GLUCOSE 189* 190*  BUN 27* 37*  CREATININE 1.24 1.29*  CALCIUM 9.0 8.8*   No results for input(s): LABPT, INR in the last 72 hours. No results for input(s): LABURIN in the last 72 hours. Results for orders placed or performed during the hospital encounter of 12/13/17  Blood Culture (routine x 2)     Status: None   Collection Time: 12/13/17  1:59 PM  Result Value Ref Range Status   Specimen Description BLOOD RIGHT WRIST  Final   Special Requests   Final    BOTTLES DRAWN AEROBIC AND ANAEROBIC Blood Culture adequate volume   Culture   Final    NO GROWTH 5 DAYS Performed at [facility], [address] [place], [place] [address]    Report Status 12/18/2017 FINAL  Final  Blood Culture (routine x 2)     Status: None   Collection Time: 12/13/17  2:05 PM  Result Value Ref Range Status   Specimen Description BLOOD RIGHT FOREARM  Final   Special Requests    Final    BOTTLES DRAWN AEROBIC AND ANAEROBIC Blood Culture adequate volume   Culture   Final    NO GROWTH 5 DAYS Performed at [facility], [address] [place], [place] [address]    Report Status 12/18/2017 FINAL  Final  Aerobic/Anaerobic Culture (surgical/deep wound)     Status: None   Collection Time: 12/13/17  9:01 PM  Result Value Ref Range Status   Specimen Description   Final    WOUND Performed at [facility], [address] [place], [place] [address]    Special Requests   Final    NONE Performed at [facility], [address] [place], [place] [address]    Gram Stain   Final    NO WBC SEEN ABUNDANT GRAM POSITIVE COCCI RARE GRAM POSITIVE RODS    Culture   Final    NORMAL SKIN FLORA MODERATE ANAEROCOCCUS PREVOTII MODERATE ANAEROBIC GRAM NEGATIVE ROD BETA LACTAMASE POSITIVE Performed at [facility], [address] [place], [place] [address]    Report Status 12/19/2017 FINAL  Final  Culture, blood (routine x 2)     Status: None   Collection Time: 12/13/17 11:04 PM  Result Value Ref Range Status   Specimen Description   Final    BLOOD RIGHT HAND Performed  at [facility], [address] [place], [place] [address]    Special Requests   Final    BOTTLES DRAWN AEROBIC AND ANAEROBIC Blood Culture adequate volume Performed at [facility], [address] [place], [place] [address]    Culture   Final    NO GROWTH 5 DAYS Performed at [facility], [address], [place], [place] [address]    Report Status 12/19/2017 FINAL  Final  Culture, blood (routine x 2)     Status: None   Collection Time: 12/13/17 11:04 PM  Result Value Ref Range Status   Specimen Description   Final    BLOOD LEFT ANTECUBITAL Performed at [facility], [address] [place], [place] [address]    Special Requests   Final    BOTTLES DRAWN AEROBIC AND ANAEROBIC Blood Culture  adequate volume Performed at [facility], [address], [place], [place] [address]    Culture   Final    NO GROWTH 5 DAYS Performed at [facility], [address], [place], [place] [address]    Report Status 12/19/2017 FINAL  Final  MRSA PCR Screening     Status: None   Collection Time: 12/14/17  1:53 AM  Result Value Ref Range Status   MRSA by PCR NEGATIVE NEGATIVE Final    Comment:        The GeneXpert MRSA Assay (FDA approved for NASAL specimens only), is one component of a comprehensive MRSA colonization surveillance program. It is not intended to diagnose MRSA infection nor to guide or monitor treatment for MRSA infections. Performed at [facility], [address], [place], [place] [address]   Culture, respiratory (NON-Expectorated)     Status: None   Collection Time: 12/15/17  3:03 AM  Result Value Ref Range Status   Specimen Description   Final    TRACHEAL ASPIRATE Performed at [facility], [address], [place], [place] [address]    Special Requests   Final    NONE Performed at [facility], [address], [place], [place] [address]    Gram Stain   Final    ABUNDANT WBC PRESENT, PREDOMINANTLY PMN ABUNDANT YEAST Performed at [facility], [address] [place], [place] [address]    Culture ABUNDANT CANDIDA ALBICANS  Final   Report Status 12/17/2017 FINAL  Final  MRSA PCR Screening     Status: None   Collection Time: 12/25/17  6:53 AM  Result Value Ref Range Status   MRSA by PCR NEGATIVE NEGATIVE Final    Comment:        The GeneXpert MRSA Assay (FDA approved for NASAL specimens only), is one component of a comprehensive MRSA colonization surveillance program. It is not intended to diagnose MRSA infection nor to guide or monitor treatment for MRSA infections. Performed at [facility], [address] [place], [place] [address]      Studies/Results: No results found.  Assessment/Plan:   Fourniere's gangrene, s/p scrotal excision, thigh poch implantation of testes/wound closure. Going home today.  I'll see him in [place] office prn   LOS: 15 days   Chelsea Aus 12/28/2017, 12:17 PM

## 2017-12-28 NOTE — Progress Notes (Signed)
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  He has COPD, GERD, HTN, anxiety.  He has completed surgical treatments for his Fournier's gangrene .    PT Comments    Assisted with amb at first without any AD (trial) however still unsteady and reaching for sink for support.  Amb in hallway with RW avg RA 95% and present with 2/4 dyspnea.  Tolerated a functional distance but limited for age.   Follow Up Recommendations  Home health PT(pt plans to D/C to daughters)     Equipment Recommendations  Rolling walker with 5" wheels;3in1 (PT)(delivered and in room)    Recommendations for Other Services       Precautions / Restrictions Precautions Precautions: Fall Precaution Comments: I and D scrotum. L jp drain. leakage from surgical site/drain Restrictions Weight Bearing Restrictions: No    Mobility  Bed Mobility   Bed Mobility: Supine to Sit;Sit to Supine     Supine to sit: Supervision Sit to supine: Supervision   General bed mobility comments: OOB in recliner  Transfers Overall transfer level: Needs assistance Equipment used: Rolling walker (2 wheeled);None Transfers: Sit to/from Stand Sit to Stand: From elevated surface;Supervision Stand pivot transfers: Supervision       General transfer comment: one VC safety with turn completion prior to sit  Ambulation/Gait Ambulation/Gait assistance: Supervision   Assistive device: Rolling walker (2 wheeled);None Gait Pattern/deviations: Step-through pattern;Decreased stride length;Wide base of support Gait velocity: decreased   General Gait Details: attempted to amb without any AD however too unsteady with wide BOS due to scrotal issue.  Amb with walker at Supervision level and avg RA was 95% with 2/4 dyspnea    Stairs              Wheelchair Mobility    Modified Rankin (Stroke Patients Only)       Balance                                            Cognition Arousal/Alertness: Awake/alert Behavior During Therapy: WFL for tasks assessed/performed Overall Cognitive Status: Within Functional Limits for tasks assessed                                        Exercises      General Comments        Pertinent Vitals/Pain Pain Assessment: Faces Pain Score: 5  Faces Pain Scale: Hurts little more Pain Location: scrotum, buttocks, inner thighs Pain Descriptors / Indicators: Burning;Discomfort Pain Intervention(s): Monitored during session    Home Living                      Prior Function            PT Goals (current goals can now be found in the care plan section) Progress towards PT goals: Progressing toward goals    Frequency    Min 3X/week      PT Plan Current plan remains appropriate    Co-evaluation              AM-PAC PT "6 Clicks" Daily Activity  Outcome Measure  Difficulty turning over in bed (including adjusting bedclothes, sheets and blankets)?: A Lot Difficulty moving from lying on back to sitting on the side of the bed? : A Lot Difficulty sitting down on and standing up from a chair with arms (e.g., wheelchair, bedside commode, etc,.)?: A Lot Help needed moving to and from a bed to chair (including a wheelchair)?: A Lot Help needed walking in hospital room?: A Lot Help needed climbing 3-5 steps with a railing? : A Lot 6 Click Score: 12    End of Session Equipment Utilized During Treatment: Gait belt Activity Tolerance: Patient tolerated treatment well Patient left: in chair;with call bell/phone within reach;with family/visitor present Nurse Communication: Mobility status PT Visit Diagnosis: Muscle weakness (generalized) (M62.81);Difficulty in walking, not elsewhere classified (R26.2);Unsteadiness on feet  (R26.81);Pain;Other abnormalities of gait and mobility (R26.89)     Time: 1130-1155 PT Time Calculation (min) (ACUTE ONLY): 25 min  Charges:  $Gait Training: 8-22 mins $Therapeutic Activity: 8-22 mins                    G Codes:       {Deserie Dirks  PTA [facility]  Acute  Rehab Pager      [phone]

## 2017-12-28 NOTE — Discharge Summary (Signed)
Discharge Summary  Patrick Aguilar XBJ:[id number] DOB: 1951-07-11  PCP: Patrick Blitz, MD  Admit date: 12/13/2017 Discharge date: 12/28/2017  Time spent: 45 mins, more than 50% time spent on coordination of care, patient declined snf placement, home health/home equipment arranged.  Recommendations for Outpatient Follow-up:  1. F/u with PMD within a week  for hospital discharge follow up, repeat cbc/bmp at follow up. pmd to monitor blood pressure and blood glucose control. 2. F/u with plastic surgery in two weeks. 3. F/u with urology 4. Home health RN for wound care and drain care.  Discharge Diagnoses:  Active Hospital Problems   Diagnosis Date Noted  . Fournier gangrene   . HTN (hypertension) 12/21/2017  . COPD (chronic obstructive pulmonary disease) ([place]) 12/21/2017    Resolved Hospital Problems   Diagnosis Date Noted Date Resolved  . Sepsis ([place]) 12/13/2017 12/21/2017    Discharge Condition: stable  Diet recommendation: heart healthy/carb modified  Filed Weights   12/26/17 0524 12/27/17 0517 12/28/17 0436  Weight: 81.9 kg (180 lb 8.9 oz) 82.5 kg (181 lb 14.1 oz) 82.6 kg (182 lb 1.6 oz)    History of present illness: (per ICU attending Dr Patrick Aguilar on day of admission)  Name: Patrick Aguilar MRN:   [id number] DOB:   [birth year]           ADMISSION DATE:  12/13/2017 CONSULTATION DATE: 12/13/17  REFERRING MD: Dr Patrick Aguilar (Urology)  CHIEF COMPLAINT: Fournier's gangrene, Septic shock, VDRF, ARDS  HISTORY OF PRESENT ILLNESS:   67yoM with hx COPD, HTN, GERD, OA, and Anxiety, presented to [facility] ER on 4/3 AM c/o SOB x 2 weeks and scrotal pain. He was found to have acute hypoxic respiratory failure and fournier's gangrene. He was transferred to [facility] where he underwent operative debridement. Postop he returned to the ICU, intubated on max vent settings with acute hypoxic respiratory failure and ARDS and in shock on vasopressors. Patient's daughter is at his  bedside. Patient is sedated.     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  #) Fournier's gangrene:  Status post debridement on 12/14/2017. Surgical culture from 12/13/2017 shows normal skin flora including moderate enterococcus prevotii, moderate anaerobic GNR, beta-lactamase positive. Patient was on vancomycin, Zosyn, clindamycin continued from 12/13/2017 to 14 2019. -Patient taken to the OR on 12/25/2017 with bilateral thigh flaps to cover the testicles and excision of tissue -Continue protein supplements, multivitamin, vitamin C, zinc for wound healing -drain care, daily dressing changes of Bactroban and Xeroform per plastic surgery - Scrotal ultrasound with Dopplers on 12/25/2017 showed no evidence of torsion -pain control, he is transitioned from iv dilaudid to oral oxycodone   #) Hypertension:  -bp low normal , will hold amlodipine 5 mg daily and benazepril 16m daily -Continue atenolol 50 mg daily -Continue chlorthalidone 25 mg daily -Continue doxazosin daily -patient is advised to check blood pressure at home, further bp meds adjustment per pmd.  #) Type 2 diabetes: Patient was diagnosed during this hospitalization with type 2 diabetes. His A1c is 7.4.  -Sliding scale insulin, before meals at bedtime  -discharge on metformin, diabetes supplies provided.  #) Neuropathy: -Continue home meds gabapentin 800 mg 4 times daily  #) COPD: No PFTs in system, unknown Gold stage - PRN bronchodilators -no wheezing, no cough, no hypoxia  Normocytic anemia: hgb around 10 No acute blood loss pmd to continue monitor  -chronic back pain, on oxycodone 10/325 q6hr at home,  transition back to home dose. No issues with bm  Fluids: Tolerating p.o. Electrolytes: Monitor and  supplement Nutrition: Regular diet, supplements per above  Prophylaxis: SCDs    Consultants:  Plastic surgery  Urology  PC CM     Code Status: full  Family Communication: patient , daughter over the phone  Disposition Plan: he declined snf placement, he elected to go to his daughter's home with home health   Consultants:  Plastic surgery  Urology  PC CM  Procedures:  12/13/2017 excision of scrotum and debridement of penis  Intubated 12/14/2017 to 12/18/2017  4/15 "Bilateral thigh flaps to cover the testicles with placement of 1 gm Acell powder. Excision of skin and subcutaneous tissue of the groin and scrotum 10 x 15 cm   Antibiotics:  IV Zosyn, clindamycin, vancomycin 12/13/2017 12/20/2017   Discharge Exam: BP 112/68   Pulse 70   Temp 98.7 F (37.1 C) (Oral)   Resp 13   Ht '5\' 8"'  (1.727 m)   Wt 82.6 kg (182 lb 1.6 oz)   SpO2 96%   BMI 27.69 kg/m    General:  NAD  Cardiovascular: RRR  Respiratory: CTABL  Abdomen: Soft/ND/NT, positive BS  Musculoskeletal: No Edema  Neuro: alert, oriented   Urology: perineum,testicles dressed, drain in place   Discharge Instructions You were cared for by a hospitalist during your hospital stay. If you have any questions about your discharge medications or the care you received while you were in the hospital after you are discharged, you can call the unit and asked to speak with the hospitalist on call if the hospitalist that took care of you is not available. Once you are discharged, your primary care physician will handle any further medical issues. Please note that NO REFILLS for any discharge medications will be authorized once you are discharged, as it is imperative that you return to your primary care physician (or establish a relationship with a primary care physician if you do not have one) for your aftercare needs so that they can reassess your need for medications and monitor your lab  values.  Discharge Instructions    Diet - low sodium heart healthy   Complete by:  As directed    Carb modified   Discharge instructions   Complete by:  As directed    Wound care per DR Patrick Aguilar "Daily drain care. May wash and keep area clean.  Bactroban and xeroform to the area daily."   Increase activity slowly   Complete by:  As directed      Allergies as of 12/28/2017      Reactions   Morphine And Related Itching      Medication List    STOP taking these medications   amLODipine 5 MG tablet Commonly known as:  NORVASC   benazepril 20 MG tablet Commonly known as:  LOTENSIN   diclofenac 75 MG EC tablet Commonly known as:  VOLTAREN   sulfamethoxazole-trimethoprim 800-160 MG tablet Commonly known as:  BACTRIM DS,SEPTRA DS     TAKE these medications   albuterol (2.5 MG/3ML) 0.083% nebulizer solution Commonly known as:  PROVENTIL Take 2.5 mg by nebulization 4 (four) times daily.   atenolol-chlorthalidone 50-25 MG tablet Commonly known as:  TENORETIC Take 1 tablet by mouth daily.   blood glucose meter kit and supplies Kit Dispense based on patient and insurance  preference. Use up to four times daily as directed. (FOR ICD-9 250.00, 250.01).   collagenase ointment Commonly known as:  SANTYL Apply 1 application topically 2 (two) times daily.   doxazosin 8 MG tablet Commonly known as:  CARDURA Take 8 mg by mouth daily.   gabapentin 800 MG tablet Commonly known as:  NEURONTIN Take 800 mg by mouth 4 (four) times daily.   metFORMIN 500 MG tablet Commonly known as:  GLUCOPHAGE Take 1 tablet (500 mg total) by mouth 2 (two) times daily with a meal.   multivitamin with minerals Tabs tablet Take 1 tablet by mouth daily. Start taking on:  12/29/2017   nutrition supplement (JUVEN) Pack Take 1 packet by mouth 2 (two) times daily between meals.   oxyCODONE-acetaminophen 10-325 MG tablet Commonly known as:  PERCOCET Take 1 tablet by mouth every 6 (six) hours as  needed for pain. What changed:  Another medication with the same name was added. Make sure you understand how and when to take each.   oxyCODONE-acetaminophen 10-325 MG tablet Commonly known as:  PERCOCET Take 1 tablet by mouth every 6 (six) hours as needed for up to 7 days for pain. What changed:  You were already taking a medication with the same name, and this prescription was added. Make sure you understand how and when to take each.   potassium chloride SA 20 MEQ tablet Commonly known as:  K-DUR,KLOR-CON Take 40 mEq by mouth daily.   ranitidine 150 MG tablet Commonly known as:  ZANTAC Take 150 mg by mouth 2 (two) times daily.   traZODone 100 MG tablet Commonly known as:  DESYREL Take 100 mg by mouth daily.            Durable Medical Equipment  (From admission, onward)        Start     Ordered   12/27/17 1753  DME 3-in-1  Once     12/27/17 1752   12/27/17 1753  For home use only DME Walker rolling  [facility])  Once    Question:  Patient needs a walker to treat with the following condition  Answer:  FTT (failure to thrive) in adult   12/27/17 1752   12/25/17 1350  For home use only DME Tube feeding pump  Once    Comments:  And supplies   12/25/17 1351     Allergies  Allergen Reactions  . Morphine And Related Itching    Contact information for follow-up providers    Patrick Aguilar, Loel Lofty, DO Follow up in 1 week(s).   Specialty:  Plastic Surgery Contact information: [place] [address] [phone]        Patrick Blitz, MD Follow up in 1 week(s).   Specialty:  Internal Medicine Why:  hospital discharge follow up. pcp to repeat cbc/bmp at follow up. pmd to monitor blood pressure and blood glucose level. Contact information: [place] [address] [phone]        Franchot Gallo, MD Follow up in 1 month(s).   Specialty:  Urology Why:  post op follow up and for nocturnal urinary frequency  Contact information: [place] [address] [phone]        chcek your blood pressure and blood sugar at home,bring in the record for your doctor to review. Follow up.        [facility] Follow up.   Specialty:  Home Health Services Why:  nurse, physical therapy,  occupational therapy Contact information: [place] [address] [place] Follow up.   Why:  walker and 3n[address] Contact information: [address] [place] [address] [phone]            Contact information for after-discharge care    [facility] .   Service:  Skilled Nursing Contact information: [address] [place] [phone]                   The results of significant diagnostics from this hospitalization (including imaging, microbiology, ancillary and laboratory) are listed below for reference.    Significant Diagnostic Studies: Ct Abdomen Pelvis Wo Contrast  Result Date: 12/13/2017 CLINICAL DATA:  67 y/o M; possible abscess to the scrotal area with tenderness and swelling. EXAM: CT ABDOMEN AND PELVIS WITHOUT CONTRAST TECHNIQUE: Multidetector CT imaging of the abdomen and pelvis was performed following the standard protocol without IV contrast. COMPARISON:  None. FINDINGS: Lower chest: Ground-glass opacities predominantly in the left lung base with peripheral sparing. Hepatobiliary: No focal liver abnormality is seen. No gallstones, gallbladder wall thickening, or biliary dilatation. Pancreas: Unremarkable. No pancreatic ductal dilatation or surrounding inflammatory changes. Spleen: Normal in size without focal abnormality. Adrenals/Urinary Tract: Bilateral adrenal hypertrophy. No focal subcentimeter left kidney interpolar exophytic lesion measuring 42 HU, probably hemorrhagic cyst. No other focal kidney lesion identified. No hydronephrosis. Normal  bladder. Stomach/Bowel: Stomach is within normal limits. Appendix not identified, no pericecal inflammation. No evidence of bowel wall thickening, distention, or inflammatory changes. Vascular/Lymphatic: Aortic atherosclerosis. No enlarged abdominal or pelvic lymph nodes. Reproductive: Prostate is unremarkable. Severe scrotal wall thickening and edema and extensive soft tissue emphysema within the scrotal compartment extending to base of penis. Other: No abdominal wall hernia or abnormality. No abdominopelvic ascites. Musculoskeletal: Grade 1 L5-S1 anterolisthesis. L5-S1 posterior instrumented fusion, no periprosthetic lucency or fracture identified. Bilateral L5 pars defects. Severe lumbar spine spondylosis with discogenic degenerative changes greatest at the L2-L4 levels. High-grade bilateral foraminal stenosis at the L2 through S1 levels and multilevel high-grade canal stenosis. IMPRESSION: 1. Extensive scrotal edema with soft tissue emphysema extending to base of penis. Findings compatible with Fournier gangrene. 2. Ground-glass opacities greatest in left lung base partially visualized with peripheral sparing may represent pneumonitis or alveolar pulmonary edema. 3. Severe spondylosis of lumbar spine with multilevel high-grade foraminal and canal stenosis. These results were called by telephone at the time of interpretation on 12/13/2017 at 4:20 pm to Dr. Nanda Quinton , who verbally acknowledged these results. Electronically Signed   By: Kristine Garbe M.D.   On: 12/13/2017 16:21   Dg Chest 1 View  Result Date: 12/14/2017 CLINICAL DATA:  Central line placement EXAM: CHEST  1 VIEW COMPARISON:  12/13/2017, 03/26/2012 FINDINGS: Endotracheal tube tip is about 2.9 cm superior to carina. Insertion of right central venous catheter with tip projecting over SVC. No pneumothorax. Esophageal tube tip is below the diaphragm but non included. Extensive left greater than right airspace disease, similar compared to  prior. Stable cardiomediastinal silhouette with aortic atherosclerosis. Postsurgical changes in the cervical spine. IMPRESSION: 1. Endotracheal tube tip about 2.9 cm superior to carina 2. New right IJ central venous catheter with tip projecting over SVC. No pneumothorax. 3. No definite interval change in fairly extensive left greater than right airspace disease Electronically Signed   By: Madie Reno.D.  On: 12/14/2017 01:35   Dg Chest 1 View  Result Date: 12/13/2017 CLINICAL DATA:  NG and ET tube placement. EXAM: CHEST  1 VIEW COMPARISON:  Chest x-ray from same day at 15:20. FINDINGS: Endotracheal tube in place with the tip approximately 1 cm above the level of the carina. Interval placement of an NG tube with the tip in the gastric fundus. Stable cardiomegaly with left greater than right perihilar airspace disease. No pleural effusion or pneumothorax. No acute osseous abnormality. IMPRESSION: 1. Endotracheal tube in place with the tip 1 cm above the level of the carina. Recommend retraction by 2-3 cm. 2. NG tube within the stomach. 3. Unchanged extensive left greater than right airspace disease. Electronically Signed   By: Titus Dubin M.D.   On: 12/13/2017 22:54   Dg Abd 1 View  Result Date: 12/13/2017 CLINICAL DATA:  Life support line placement. EXAM: ABDOMEN - 1 VIEW COMPARISON:  CT abdomen and pelvis December 13, 2017 FINDINGS: No is a gastric tube looped in proximal stomach with distal tip projecting in gastric cardia, side port past GE junction region. Included bowel gas pattern is nondilated and nonobstructive. No intra-abdominal mass effect or pathologic calcifications. L5-S1 PLIF. Severe degenerative change of the lumbar spine. IMPRESSION: Nasogastric tube tip projects in proximal stomach. Electronically Signed   By: Elon Alas M.D.   On: 12/13/2017 22:51   US Scrotum  Result Date: 12/25/2017 CLINICAL DATA:  Scrotal pain, recent scrotal removal for treatment of Fournier's gangrene  EXAM: SCROTAL ULTRASOUND DOPPLER ULTRASOUND OF THE TESTICLES TECHNIQUE: Complete ultrasound examination of the testicles, epididymis, and other scrotal structures was performed. Color and spectral Doppler ultrasound were also utilized to evaluate blood flow to the testicles. COMPARISON:  12/13/2017 FINDINGS: Right testicle Measurements: 2.8 x 2.3 x 2.9 cm. No mass or microlithiasis visualized. Left testicle Measurements: 3.4 x 1.9 x 2.9 cm. No mass or microlithiasis visualized. Right epididymis:  Not well visualized Left epididymis:  Not well visualized Hydrocele:  Not applicable Varicocele:  No varicocele is noted. Pulsed Doppler interrogation of both testes demonstrates normal low resistance arterial and venous waveforms bilaterally. Hypoechoic tissue is noted surrounding both testicles likely representing some postoperative fluid. IMPRESSION: Some fluid is noted surrounding the testicles bilaterally likely related to the recent operative procedure. Normal waveforms are noted in both testicles. Electronically Signed   By: Inez Catalina M.D.   On: 12/25/2017 17:28   Dg Chest Port 1 View  Result Date: 12/21/2017 CLINICAL DATA:  Acute respiratory failure with hypoxemia EXAM: PORTABLE CHEST 1 VIEW COMPARISON:  Yesterday FINDINGS: Low volume chest with indistinct reticular opacities at the bases. Normal heart size. Aortic tortuosity. No visible effusion or pneumothorax. IMPRESSION: Mild lower lung reticulation is stable from yesterday. There has been significant improvement in aeration since admission study 12/13/2017. Electronically Signed   By: Monte Fantasia M.D.   On: 12/21/2017 08:02   Dg Chest Port 1 View  Result Date: 12/20/2017 CLINICAL DATA:  Respiratory failure EXAM: PORTABLE CHEST 1 VIEW COMPARISON:  12/19/2017 FINDINGS: Cardiac shadow is stable. Right jugular central line is noted. Endotracheal tube and nasogastric catheter have been removed in the interval. Postsurgical changes in the cervical  spine are noted. Lungs are well aerated bilaterally. Mild vascular congestion is again identified stable from the prior exam. IMPRESSION: No significant interval change from the prior exam. Electronically Signed   By: Inez Catalina M.D.   On: 12/20/2017 07:47   Dg Chest Port 1 View  Result Date: 12/19/2017 CLINICAL  DATA:  Endotracheal tube reposition. EXAM: PORTABLE CHEST 1 VIEW COMPARISON:  Yesterday at 0451 hour FINDINGS: Endotracheal tube at the thoracic inlet approximately 4.9 cm from the carina. Enteric tube in place with tip not included in the field of view. Right internal jugular central venous catheter tip in the SVC. Improving right lung aeration. Improving perihilar opacities. Slight worsening aeration at the left lung base. No pneumothorax. No large pleural effusion. IMPRESSION: 1. Endotracheal tube at the thoracic inlet approximately 4.9 cm from the carina. 2. Improving perihilar aeration which may be improving edema. Bibasilar opacities persist, left greater than right. Electronically Signed   By: Jeb Levering M.D.   On: 12/19/2017 03:01   Dg Chest Port 1 View  Result Date: 12/18/2017 CLINICAL DATA:  Respiratory failure EXAM: PORTABLE CHEST 1 VIEW COMPARISON:  12/17/2017 FINDINGS: Support tubes and lines are stable. The film is slightly rotated, but there appears to be increasing RIGHT lung opacities, likely pneumonia. No significant improvement on the LEFT. Stable cardiomediastinal silhouette. No pneumothorax. IMPRESSION: Worsening aeration. Electronically Signed   By: Staci Righter M.D.   On: 12/18/2017 07:06   Dg Chest Port 1 View  Result Date: 12/17/2017 CLINICAL DATA:  Respiratory failure EXAM: PORTABLE CHEST 1 VIEW COMPARISON:  December 16, 2017 FINDINGS: The ETT and right IJ are in good position. No pneumothorax. Bibasilar infiltrates persist. No other changes. IMPRESSION: Stable support apparatus. Stable bibasilar infiltrates. Possible mild superimposed pulmonary venous congestion.  Electronically Signed   By: Dorise Bullion III M.D   On: 12/17/2017 08:22   Dg Chest Port 1 View  Result Date: 12/16/2017 CLINICAL DATA:  Respiratory failure EXAM: PORTABLE CHEST 1 VIEW COMPARISON:  12/15/2017 FINDINGS: Endotracheal tube in good position. Right jugular central venous catheter tip in the lower SVC unchanged. No pneumothorax. NG tube enters the stomach. Diffuse bilateral airspace disease left greater than right shows mild interval improvement. Progression of bibasilar atelectasis.  No effusion IMPRESSION: Support lines remain in good position Diffuse bilateral airspace disease with interval improvement Progression of bibasilar atelectasis. Electronically Signed   By: Franchot Gallo M.D.   On: 12/16/2017 07:27   Dg Chest Port 1 View  Result Date: 12/15/2017 CLINICAL DATA:  Advanced endotracheal tube. EXAM: PORTABLE CHEST 1 VIEW COMPARISON:  12/14/2017 FINDINGS: Endotracheal tube tip measures 5.6 cm above the carina. Right central venous catheter with tip over the cavoatrial junction region. No pneumothorax. Enteric tube tip is off the field of view but below the left hemidiaphragm. Heart size and pulmonary vascularity are normal. Bilateral perihilar airspace disease may indicate edema or pneumonia. No pneumothorax. No pleural effusions. Calcification of the aorta. Postoperative changes in the cervical spine and right shoulder. IMPRESSION: Appliances appear in satisfactory position. Bilateral perihilar infiltrates may indicate edema or pneumonia. Electronically Signed   By: Lucienne Capers M.D.   On: 12/15/2017 03:24   Dg Chest Port 1 View  Result Date: 12/14/2017 CLINICAL DATA:  Respiratory failure EXAM: PORTABLE CHEST 1 VIEW COMPARISON:  12/14/2017 FINDINGS: Endotracheal tube in good position. Right jugular catheter tip in the SVC. NG tube in the stomach. Diffuse bilateral airspace disease left greater than right is unchanged. No significant pleural effusion. IMPRESSION: Severe bilateral  airspace disease left greater than right unchanged. Endotracheal tube remains in good position. Electronically Signed   By: Franchot Gallo M.D.   On: 12/14/2017 07:42   Dg Chest Portable 1 View  Result Date: 12/13/2017 CLINICAL DATA:  Short of breath EXAM: PORTABLE CHEST 1 VIEW COMPARISON:  None.  FINDINGS: Severe diffuse bilateral airspace disease. Cardiac enlargement. No pleural effusion. Cervical fusion hardware IMPRESSION: Extensive diffuse bilateral airspace disease most likely pulmonary edema. Electronically Signed   By: Franchot Gallo M.D.   On: 12/13/2017 15:35   Dg Abd Portable 1v  Result Date: 12/15/2017 CLINICAL DATA:  OG tube placement EXAM: PORTABLE ABDOMEN - 1 VIEW COMPARISON:  None. FINDINGS: Enteric tube demonstrated in the upper abdomen with tip to the right of midline consistent with location in the distal stomach. Perihilar infiltrates demonstrated in the lungs. IMPRESSION: Enteric tube tip is in the right upper quadrant consistent with location in the distal stomach. Electronically Signed   By: Lucienne Capers M.D.   On: 12/15/2017 05:46   US Scrotum Doppler  Result Date: 12/25/2017 CLINICAL DATA:  Scrotal pain, recent scrotal removal for treatment of Fournier's gangrene EXAM: SCROTAL ULTRASOUND DOPPLER ULTRASOUND OF THE TESTICLES TECHNIQUE: Complete ultrasound examination of the testicles, epididymis, and other scrotal structures was performed. Color and spectral Doppler ultrasound were also utilized to evaluate blood flow to the testicles. COMPARISON:  12/13/2017 FINDINGS: Right testicle Measurements: 2.8 x 2.3 x 2.9 cm. No mass or microlithiasis visualized. Left testicle Measurements: 3.4 x 1.9 x 2.9 cm. No mass or microlithiasis visualized. Right epididymis:  Not well visualized Left epididymis:  Not well visualized Hydrocele:  Not applicable Varicocele:  No varicocele is noted. Pulsed Doppler interrogation of both testes demonstrates normal low resistance arterial and venous  waveforms bilaterally. Hypoechoic tissue is noted surrounding both testicles likely representing some postoperative fluid. IMPRESSION: Some fluid is noted surrounding the testicles bilaterally likely related to the recent operative procedure. Normal waveforms are noted in both testicles. Electronically Signed   By: Inez Catalina M.D.   On: 12/25/2017 17:28    Microbiology: Recent Results (from the past 240 hour(s))  MRSA PCR Screening     Status: None   Collection Time: 12/25/17  6:53 AM  Result Value Ref Range Status   MRSA by PCR NEGATIVE NEGATIVE Final    Comment:        The GeneXpert MRSA Assay (FDA approved for NASAL specimens only), is one component of a comprehensive MRSA colonization surveillance program. It is not intended to diagnose MRSA infection nor to guide or monitor treatment for MRSA infections. Performed at [facility], [place] [address] [place], [place] [address]      Labs: Basic Metabolic Panel: Recent Labs  Lab 12/22/17 0458 12/23/17 0409 12/24/17 0349 12/27/17 0443 12/28/17 0427  NA 136 138 135 134* 135  K 3.2* 3.4* 4.1 3.4* 3.6  CL 105 107 105 102 102  CO2 23 20* 20* 19* 22  GLUCOSE 178* 135* 179* 189* 190*  BUN '16 14 19 ' 27* 37*  CREATININE 0.93 0.97 1.13 1.24 1.29*  CALCIUM 8.2* 8.5* 8.4* 9.0 8.8*  MG 1.9  --   --  1.8 1.8   Liver Function Tests: Recent Labs  Lab 12/22/17 0458  AST 21  ALT 39  ALKPHOS 136*  BILITOT 0.8  PROT 5.5*  ALBUMIN 2.1*   No results for input(s): LIPASE, AMYLASE in the last 168 hours. No results for input(s): AMMONIA in the last 168 hours. CBC: Recent Labs  Lab 12/22/17 0458 12/23/17 0409 12/25/17 0412 12/27/17 0443 12/28/17 0427  WBC 17.7* 19.8* 16.9* 11.4* 10.8*  NEUTROABS  --   --   --   --  7.1  HGB 9.6* 10.0* 10.0* 9.9* 9.3*  HCT 28.0* 29.6*  29.2* 29.3* 28.8* 27.3*  MCV 86.7 88.4 89.3 89.4 90.4  PLT 322 370 432* 445* 467*   Cardiac Enzymes: No results for input(s): CKTOTAL,  CKMB, CKMBINDEX, TROPONINI in the last 168 hours. BNP: BNP (last 3 results) No results for input(s): BNP in the last 8760 hours.  ProBNP (last 3 results) No results for input(s): PROBNP in the last 8760 hours.  CBG: Recent Labs  Lab 12/27/17 0736 12/27/17 1202 12/27/17 1712 12/27/17 2029 12/28/17 0729  GLUCAP 187* 226* 192* 230* 196*       Signed:  Florencia Reasons MD, PhD  [facility] 12/28/2017, 11:31 AM

## 2018-01-01 DIAGNOSIS — E11622 Type 2 diabetes mellitus with other skin ulcer: Secondary | ICD-10-CM | POA: Insufficient documentation

## 2018-01-05 ENCOUNTER — Other Ambulatory Visit: Payer: Self-pay

## 2018-01-05 ENCOUNTER — Ambulatory Visit: Payer: Self-pay | Admitting: Plastic Surgery

## 2018-01-05 ENCOUNTER — Encounter (HOSPITAL_COMMUNITY): Payer: Self-pay | Admitting: *Deleted

## 2018-01-05 DIAGNOSIS — S31109D Unspecified open wound of abdominal wall, unspecified quadrant without penetration into peritoneal cavity, subsequent encounter: Secondary | ICD-10-CM

## 2018-01-05 NOTE — H&P (View-Only) (Signed)
Patrick Aguilar is an 66 y.o. male.   Chief Complaint: groin wound HPI: The patient is a 66 yo male here for care of his a large scrotal wound following treatment for Fournier's gangrene of the scrotum. He was admitted to [facility] in transfer from [facility] with sepsis/shock from Fournier's gangrene on 12/13/17. Underwent scrotal debridement and total scrotectomy 12/14/17 by urology.  Course complicated by ARDS and AKI. Remained on vent with sepsis requiring pressors post op. Extubated 12/19/17. He was undiagnosed T2 diabetic when admitted. History of COPD, HTN, smoking.   The patient underwent bilateral thigh flaps to cover the testicles and closure of the skin 12/25/17. The wound has now opened further.  10 days post op: Reports the wound has opened back up. He is very upset over the wound re-opened.  The left testes has come out of the thigh pocket and is exposed again. He is taking protein supplements and on medications for diabetes control now.   Past Medical History:  Diagnosis Date  . Anxiety   . Arthritis   . Complication of anesthesia    low respirations, low BP  . COPD (chronic obstructive pulmonary disease) (HCC)   . Diabetes mellitus without complication (HCC)   . GERD (gastroesophageal reflux disease)   . History of ARDS   . Hypertension   . Respiratory failure, acute (HCC)   . Shortness of breath dyspnea     Past Surgical History:  Procedure Laterality Date  . APPENDECTOMY    . APPLICATION OF A-CELL OF EXTREMITY N/A 12/25/2017   Procedure: APPLICATION OF A-CELL;  Surgeon: Dillingham, Claire S, DO;  Location: [facility];  Service: Plastics;  Laterality: N/A;  . BACK SURGERY     x5  . CARDIAC CATHETERIZATION  06   neg  . CERVICAL DISC SURGERY    . COLONOSCOPY    . INCISION AND DRAINAGE OF WOUND N/A 12/25/2017   Procedure: Irrigation and debridement of Fournier's of scrotum with placement of testes in subcutaneous thigh pockets and Acell placement;  Surgeon: Dillingham,  Claire S, DO;  Location: [facility];  Service: Plastics;  Laterality: N/A;  . ORCHIECTOMY N/A 12/13/2017   Procedure: EXCISION OF SCROTUM AND DEBRIDEMENT OF PENIS;  Surgeon: Dahlstedt, Stephen, MD;  Location: [facility];  Service: Urology;  Laterality: N/A;  . PLANTAR FASCIA SURGERY Bilateral   . shoulders Bilateral    rotator cuff  . SUBMANDIBULAR GLAND EXCISION Left 03/12/2015   Procedure: LEFT SUBMANDIBULAR GLAND RESECTION;  Surgeon: Dwight Bates, MD;  Location: [facility];  Service: ENT;  Laterality: Left;  . TONSILLECTOMY      Family History  Problem Relation Age of Onset  . Lung cancer Mother   . Bladder Cancer Father    Social History:  reports that he quit smoking about 3 weeks ago. His smoking use included cigarettes. He has a 50.00 pack-year smoking history. He has never used smokeless tobacco. He reports that he does not drink alcohol or use drugs.  Allergies:  Allergies  Allergen Reactions  . Morphine And Related Itching     (Not in a hospital admission)  No results found for this or any previous visit (from the past 48 hour(s)). No results found.  Review of Systems  Constitutional: Positive for weight loss. Negative for diaphoresis and fever.  HENT: Negative.   Eyes: Negative.   Respiratory: Negative.   Cardiovascular: Negative.   Gastrointestinal: Negative.   Genitourinary: Negative.   Musculoskeletal: Negative.   Psychiatric/Behavioral: Negative.       There were no vitals taken for this visit. Physical Exam  Constitutional: He is oriented to person, place, and time. He appears well-developed.  HENT:  Head: Normocephalic and atraumatic.  Eyes: Pupils are equal, round, and reactive to light. EOM are normal.  Cardiovascular: Normal rate.  Respiratory: Effort normal.  GI: He exhibits no distension.  Musculoskeletal: He exhibits edema and tenderness.  Neurological: He is alert and oriented to person, place, and time.  Skin: Skin is warm. No erythema.  Psychiatric: He  has a normal mood and affect. His behavior is normal.     Assessment/Plan Plan for debridement of groin wound with acell placement.  Claire S Dillingham, DO 01/05/2018, 12:38 PM   

## 2018-01-05 NOTE — H&P (View-Only) (Signed)
Patrick Aguilar is an 66 y.o. male.   Chief Complaint: groin wound HPI: The patient is a 66 yo male here for care of his a large scrotal wound following treatment for Fournier's gangrene of the scrotum. He was admitted to [facility] in transfer from [facility] with sepsis/shock from Fournier's gangrene on 12/13/17. Underwent scrotal debridement and total scrotectomy 12/14/17 by urology.  Course complicated by ARDS and AKI. Remained on vent with sepsis requiring pressors post op. Extubated 12/19/17. He was undiagnosed T2 diabetic when admitted. History of COPD, HTN, smoking.   The patient underwent bilateral thigh flaps to cover the testicles and closure of the skin 12/25/17. The wound has now opened further.  10 days post op: Reports the wound has opened back up. He is very upset over the wound re-opened.  The left testes has come out of the thigh pocket and is exposed again. He is taking protein supplements and on medications for diabetes control now.   Past Medical History:  Diagnosis Date  . Anxiety   . Arthritis   . Complication of anesthesia    low respirations, low BP  . COPD (chronic obstructive pulmonary disease) (HCC)   . Diabetes mellitus without complication (HCC)   . GERD (gastroesophageal reflux disease)   . History of ARDS   . Hypertension   . Respiratory failure, acute (HCC)   . Shortness of breath dyspnea     Past Surgical History:  Procedure Laterality Date  . APPENDECTOMY    . APPLICATION OF A-CELL OF EXTREMITY N/A 12/25/2017   Procedure: APPLICATION OF A-CELL;  Surgeon: Kelise Kuch S, DO;  [facility]: [facility];  Service: Plastics;  Laterality: N/A;  . BACK SURGERY     x5  . CARDIAC CATHETERIZATION  06   neg  . CERVICAL DISC SURGERY    . COLONOSCOPY    . INCISION AND DRAINAGE OF WOUND N/A 12/25/2017   Procedure: Irrigation and debridement of Fournier's of scrotum with placement of testes in subcutaneous thigh pockets and Acell placement;  Surgeon: Zosia Lucchese,  Zyrus Hetland S, DO;  [facility]: [facility];  Service: Plastics;  Laterality: N/A;  . ORCHIECTOMY N/A 12/13/2017   Procedure: EXCISION OF SCROTUM AND DEBRIDEMENT OF PENIS;  Surgeon: Dahlstedt, Stephen, MD;  [facility]: [facility];  Service: Urology;  Laterality: N/A;  . PLANTAR FASCIA SURGERY Bilateral   . shoulders Bilateral    rotator cuff  . SUBMANDIBULAR GLAND EXCISION Left 03/12/2015   Procedure: LEFT SUBMANDIBULAR GLAND RESECTION;  Surgeon: Dwight Bates, MD;  Location: [facility];  Service: ENT;  Laterality: Left;  . TONSILLECTOMY      Family History  Problem Relation Age of Onset  . Lung cancer Mother   . Bladder Cancer Father    Social History:  reports that he quit smoking about 3 weeks ago. His smoking use included cigarettes. He has a 50.00 pack-year smoking history. He has never used smokeless tobacco. He reports that he does not drink alcohol or use drugs.  Allergies:  Allergies  Allergen Reactions  . Morphine And Related Itching     (Not in a hospital admission)  No results found for this or any previous visit (from the past 48 hour(s)). No results found.  Review of Systems  Constitutional: Positive for weight loss. Negative for diaphoresis and fever.  HENT: Negative.   Eyes: Negative.   Respiratory: Negative.   Cardiovascular: Negative.   Gastrointestinal: Negative.   Genitourinary: Negative.   Musculoskeletal: Negative.   Psychiatric/Behavioral: Negative.       There were no vitals taken for this visit. Physical Exam  Constitutional: He is oriented to person, place, and time. He appears well-developed.  HENT:  Head: Normocephalic and atraumatic.  Eyes: Pupils are equal, round, and reactive to light. EOM are normal.  Cardiovascular: Normal rate.  Respiratory: Effort normal.  GI: He exhibits no distension.  Musculoskeletal: He exhibits edema and tenderness.  Neurological: He is alert and oriented to person, place, and time.  Skin: Skin is warm. No erythema.  Psychiatric: He  has a normal mood and affect. His behavior is normal.     Assessment/Plan Plan for debridement of groin wound with acell placement.  Kongmeng Santoro S Alexina Niccoli, DO 01/05/2018, 12:38 PM   

## 2018-01-05 NOTE — H&P (Signed)
Patrick FredericksonCharles W Aguilar is an 67 y.o. male.   Chief Complaint: groin wound HPI: The patient is a 67 yo male here for care of his a large scrotal wound following treatment for Fournier's gangrene of the scrotum. [facility] was [facility] with [facility]/shock from Fournier's gangrene on 12/13/17. Underwent scrotal debridement and total scrotectomy 12/14/17 by urology.  Course complicated by ARDS and AKI. Remained on vent with sepsis requiring pressors post op. Extubated 12/19/17. He was undiagnosed T2 diabetic when admitted. History of COPD, HTN, smoking.   The patient underwent bilateral thigh flaps to cover the testicles and closure of the skin 12/25/17. The wound has now opened further.  10 days post op: Reports the wound has opened back up. He is very upset over the wound re-opened.  The left testes has come out of the thigh pocket and is exposed again. He is taking protein supplements and on medications for diabetes control now.   Past Medical History:  Diagnosis Date  . Anxiety   . Arthritis   . Complication of anesthesia    low respirations, low BP  . COPD (chronic obstructive pulmonary disease) (HCC)   . Diabetes mellitus without complication (HCC)   . GERD (gastroesophageal reflux disease)   . History of ARDS   . Hypertension   . Respiratory failure, acute (HCC)   . Shortness of breath dyspnea     Past Surgical History:  Procedure Laterality Date  . APPENDECTOMY    . APPLICATION OF A-CELL OF EXTREMITY N/A 12/25/2017   Procedure: APPLICATION OF A-CELL;  Surgeon: Peggye Formillingham, Arnulfo Batson S, DO;  Location: [facility];  Service: Plastics;  Laterality: N/A;  . BACK SURGERY     x5  . CARDIAC CATHETERIZATION  06   neg  . CERVICAL DISC SURGERY    . COLONOSCOPY    . INCISION AND DRAINAGE OF WOUND N/A 12/25/2017   Procedure: Irrigation and debridement of Fournier's of scrotum with placement of testes in subcutaneous thigh pockets and Acell placement;  Surgeon: Peggye Formillingham,  Reisha Wos S, DO;  Location: [facility];  Service: Plastics;  Laterality: N/A;  . ORCHIECTOMY N/A 12/13/2017   Procedure: EXCISION OF SCROTUM AND DEBRIDEMENT OF PENIS;  Surgeon: Marcine Matarahlstedt, Stephen, MD;  [facility];  Service: Urology;  Laterality: N/A;  . PLANTAR FASCIA SURGERY Bilateral   . shoulders Bilateral    rotator cuff  . SUBMANDIBULAR GLAND EXCISION Left 03/12/2015   Procedure: LEFT SUBMANDIBULAR GLAND RESECTION;  Surgeon: Christia Readingwight Bates, MD;  [facility];  Service: ENT;  Laterality: Left;  . TONSILLECTOMY      Family History  Problem Relation Age of Onset  . Lung cancer Mother   . Bladder Cancer Father    Social History:  reports that he quit smoking about 3 weeks ago. His smoking use included cigarettes. He has a 50.00 pack-year smoking history. He has never used smokeless tobacco. He reports that he does not drink alcohol or use drugs.  Allergies:  Allergies  Allergen Reactions  . Morphine And Related Itching     (Not in a hospital admission)  No results found for this or any previous visit (from the past 48 hour(s)). No results found.  Review of Systems  Constitutional: Positive for weight loss. Negative for diaphoresis and fever.  HENT: Negative.   Eyes: Negative.   Respiratory: Negative.   Cardiovascular: Negative.   Gastrointestinal: Negative.   Genitourinary: Negative.   Musculoskeletal: Negative.   Psychiatric/Behavioral: Negative.  There were no vitals taken for this visit. Physical Exam  Constitutional: He is oriented to person, place, and time. He appears well-developed.  HENT:  Head: Normocephalic and atraumatic.  Eyes: Pupils are equal, round, and reactive to light. EOM are normal.  Cardiovascular: Normal rate.  Respiratory: Effort normal.  GI: He exhibits no distension.  Musculoskeletal: He exhibits edema and tenderness.  Neurological: He is alert and oriented to person, place, and time.  Skin: Skin is warm. No erythema.  Psychiatric: He  has a normal mood and affect. His behavior is normal.     Assessment/Plan Plan for debridement of groin wound with acell placement.  Alena Bills Kyden Potash, DO 01/05/2018, 12:38 PM

## 2018-01-05 NOTE — Progress Notes (Signed)
Called pt for pre-op call, he called his daughter, Juliette AlcideMelinda to talk with me. Pt was at [facility] this month, discharged to home on 12/28/17. Pt was admitted to ICU for Respiratory Failure and ARDS after surgery on his scrotum. Pt was diagnosed with type 2 diabetes while in the hospital. A1C was 7.4  12/13/17. They started him on Metformin when he was discharged. Melinda states fasting blood sugar has been between 187-231. States at lunch and in the evening they run in the 130's. She states pt has not had to take any of his blood pressure medicine since he was discharged, they are all PRN. She states that she really wasn't given a parameter of when she should give him BP medicine. She states she noticed at the hospital they only gave it to him if "the top number was 200 or higher". She states pt has not smoked since he was admitted to the hospital. She states pt had no cardiac history.

## 2018-01-08 ENCOUNTER — Ambulatory Visit (HOSPITAL_COMMUNITY): Payer: Medicare HMO | Admitting: Certified Registered"

## 2018-01-08 ENCOUNTER — Encounter (HOSPITAL_COMMUNITY)
Admission: RE | Disposition: A | Discharge status: Skilled Nursing Facility | Payer: Self-pay | Source: Ambulatory Visit | Attending: Plastic Surgery

## 2018-01-08 ENCOUNTER — Other Ambulatory Visit: Payer: Self-pay

## 2018-01-08 ENCOUNTER — Inpatient Hospital Stay (HOSPITAL_COMMUNITY)
Admission: RE | Admit: 2018-01-08 | Discharge: 2018-01-15 | DRG: 578 | Disposition: A | Discharge status: Skilled Nursing Facility | Payer: Medicare HMO | Source: Ambulatory Visit | Attending: Plastic Surgery | Admitting: Plastic Surgery

## 2018-01-08 ENCOUNTER — Encounter (HOSPITAL_COMMUNITY): Payer: Self-pay | Admitting: *Deleted

## 2018-01-08 DIAGNOSIS — S31104A Unspecified open wound of abdominal wall, left lower quadrant without penetration into peritoneal cavity, initial encounter: Secondary | ICD-10-CM | POA: Diagnosis present

## 2018-01-08 DIAGNOSIS — J449 Chronic obstructive pulmonary disease, unspecified: Secondary | ICD-10-CM | POA: Diagnosis present

## 2018-01-08 DIAGNOSIS — K219 Gastro-esophageal reflux disease without esophagitis: Secondary | ICD-10-CM | POA: Diagnosis present

## 2018-01-08 DIAGNOSIS — E119 Type 2 diabetes mellitus without complications: Secondary | ICD-10-CM | POA: Diagnosis present

## 2018-01-08 DIAGNOSIS — Z79899 Other long term (current) drug therapy: Secondary | ICD-10-CM

## 2018-01-08 DIAGNOSIS — Z7984 Long term (current) use of oral hypoglycemic drugs: Secondary | ICD-10-CM

## 2018-01-08 DIAGNOSIS — S31109A Unspecified open wound of abdominal wall, unspecified quadrant without penetration into peritoneal cavity, initial encounter: Principal | ICD-10-CM | POA: Diagnosis present

## 2018-01-08 DIAGNOSIS — R3915 Urgency of urination: Secondary | ICD-10-CM | POA: Diagnosis present

## 2018-01-08 DIAGNOSIS — Z87891 Personal history of nicotine dependence: Secondary | ICD-10-CM

## 2018-01-08 DIAGNOSIS — I10 Essential (primary) hypertension: Secondary | ICD-10-CM | POA: Diagnosis present

## 2018-01-08 DIAGNOSIS — S31109D Unspecified open wound of abdominal wall, unspecified quadrant without penetration into peritoneal cavity, subsequent encounter: Secondary | ICD-10-CM

## 2018-01-08 HISTORY — PX: INCISION AND DRAINAGE OF WOUND: SHX1803

## 2018-01-08 HISTORY — DX: Adverse effect of unspecified anesthetic, initial encounter: T41.45XA

## 2018-01-08 HISTORY — DX: Personal history of other diseases of the respiratory system: Z87.09

## 2018-01-08 HISTORY — PX: OTHER SURGICAL HISTORY: SHX169

## 2018-01-08 HISTORY — PX: APPLICATION OF A-CELL OF CHEST/ABDOMEN: SHX6302

## 2018-01-08 HISTORY — DX: Essential (primary) hypertension: I10

## 2018-01-08 HISTORY — DX: Acute respiratory failure, unspecified whether with hypoxia or hypercapnia: J96.00

## 2018-01-08 HISTORY — DX: Type 2 diabetes mellitus without complications: E11.9

## 2018-01-08 HISTORY — DX: Other complications of anesthesia, initial encounter: T88.59XA

## 2018-01-08 LAB — GLUCOSE, CAPILLARY
Glucose-Capillary: 179 mg/dL — ABNORMAL HIGH (ref 65–99)
Glucose-Capillary: 156 mg/dL — ABNORMAL HIGH (ref 65–99)

## 2018-01-08 SURGERY — IRRIGATION AND DEBRIDEMENT WOUND
Anesthesia: General | Site: Scrotum

## 2018-01-08 MED ORDER — ONDANSETRON HCL 4 MG/2ML IJ SOLN: 4.0000 mg | Freq: Four times a day (QID) | INTRAMUSCULAR | Status: DC | PRN

## 2018-01-08 MED ORDER — NAPROXEN 250 MG PO TABS: 500.0000 mg | ORAL_TABLET | Freq: Two times a day (BID) | ORAL | Status: DC | PRN

## 2018-01-08 MED ORDER — CEFAZOLIN SODIUM-DEXTROSE 2-4 GM/100ML-% IV SOLN
2.0000 g | Freq: Three times a day (TID) | INTRAVENOUS | Status: DC
  Administered 2018-01-08 – 2018-01-13 (×15): 2 g via INTRAVENOUS
  Filled 2018-01-08 (×16): qty 100

## 2018-01-08 MED ORDER — POTASSIUM CHLORIDE IN NACL 20-0.45 MEQ/L-% IV SOLN
INTRAVENOUS | Status: DC
  Administered 2018-01-08 – 2018-01-13 (×4): via INTRAVENOUS
  Filled 2018-01-08 (×4): qty 1000

## 2018-01-08 MED ORDER — ACETAMINOPHEN 325 MG PO TABS
325.0000 mg | ORAL_TABLET | Freq: Four times a day (QID) | ORAL | Status: DC
  Administered 2018-01-08 (×2): 325 mg via ORAL
  Filled 2018-01-08 (×2): qty 1

## 2018-01-08 MED ORDER — CEFAZOLIN SODIUM-DEXTROSE 2-4 GM/100ML-% IV SOLN
2.0000 g | INTRAVENOUS | Status: AC
  Administered 2018-01-08: 2 g via INTRAVENOUS
  Filled 2018-01-08: qty 100

## 2018-01-08 MED ORDER — DIPHENHYDRAMINE HCL 12.5 MG/5ML PO ELIX: 12.5000 mg | ORAL_SOLUTION | Freq: Four times a day (QID) | ORAL | Status: DC | PRN

## 2018-01-08 MED ORDER — GABAPENTIN 400 MG PO CAPS
800.0000 mg | ORAL_CAPSULE | Freq: Four times a day (QID) | ORAL | Status: DC
  Administered 2018-01-08 – 2018-01-15 (×26): 800 mg via ORAL
  Filled 2018-01-08: qty 8
  Filled 2018-01-08 (×14): qty 2
  Filled 2018-01-08: qty 8
  Filled 2018-01-08 (×9): qty 2

## 2018-01-08 MED ORDER — PROPOFOL 10 MG/ML IV BOLUS
INTRAVENOUS | Status: AC
  Filled 2018-01-08: qty 40

## 2018-01-08 MED ORDER — POLYMYXIN B SULFATE 500000 UNITS IJ SOLR
INTRAMUSCULAR | Status: DC | PRN
  Administered 2018-01-08: 500 mL

## 2018-01-08 MED ORDER — DEXAMETHASONE SODIUM PHOSPHATE 10 MG/ML IJ SOLN
INTRAMUSCULAR | Status: AC
  Filled 2018-01-08: qty 1

## 2018-01-08 MED ORDER — PROPOFOL 10 MG/ML IV BOLUS
INTRAVENOUS | Status: DC | PRN
  Administered 2018-01-08: 160 mg via INTRAVENOUS

## 2018-01-08 MED ORDER — 0.9 % SODIUM CHLORIDE (POUR BTL) OPTIME
TOPICAL | Status: DC | PRN
  Administered 2018-01-08: 1000 mL

## 2018-01-08 MED ORDER — POLYETHYLENE GLYCOL 3350 17 G PO PACK: 17.0000 g | PACK | Freq: Every day | ORAL | Status: DC | PRN

## 2018-01-08 MED ORDER — SENNA 8.6 MG PO TABS
1.0000 | ORAL_TABLET | Freq: Two times a day (BID) | ORAL | Status: DC
  Administered 2018-01-08 – 2018-01-15 (×5): 8.6 mg via ORAL
  Filled 2018-01-08 (×13): qty 1

## 2018-01-08 MED ORDER — SODIUM CHLORIDE 0.9 % IV SOLN
INTRAVENOUS | Status: AC
  Filled 2018-01-08: qty 500000

## 2018-01-08 MED ORDER — HYDROCODONE-ACETAMINOPHEN 5-325 MG PO TABS
1.0000 | ORAL_TABLET | ORAL | Status: DC | PRN
  Administered 2018-01-08 – 2018-01-11 (×17): 2 via ORAL
  Administered 2018-01-11: 1 via ORAL
  Administered 2018-01-11 – 2018-01-14 (×16): 2 via ORAL
  Administered 2018-01-14 – 2018-01-15 (×2): 1 via ORAL
  Administered 2018-01-15 (×2): 2 via ORAL
  Administered 2018-01-15: 1 via ORAL
  Filled 2018-01-08 (×39): qty 2

## 2018-01-08 MED ORDER — MIDAZOLAM HCL 2 MG/2ML IJ SOLN
INTRAMUSCULAR | Status: DC | PRN
  Administered 2018-01-08: 2 mg via INTRAVENOUS

## 2018-01-08 MED ORDER — HYDROCODONE-ACETAMINOPHEN 5-325 MG PO TABS
ORAL_TABLET | ORAL | Status: AC
  Filled 2018-01-08: qty 2

## 2018-01-08 MED ORDER — FENTANYL CITRATE (PF) 100 MCG/2ML IJ SOLN
25.0000 ug | INTRAMUSCULAR | Status: DC | PRN
  Administered 2018-01-08 (×2): 50 ug via INTRAVENOUS

## 2018-01-08 MED ORDER — FENTANYL CITRATE (PF) 100 MCG/2ML IJ SOLN
INTRAMUSCULAR | Status: AC
  Filled 2018-01-08: qty 2

## 2018-01-08 MED ORDER — ONDANSETRON HCL 4 MG/2ML IJ SOLN
INTRAMUSCULAR | Status: AC
  Filled 2018-01-08: qty 2

## 2018-01-08 MED ORDER — FENTANYL CITRATE (PF) 250 MCG/5ML IJ SOLN
INTRAMUSCULAR | Status: AC
  Filled 2018-01-08: qty 5

## 2018-01-08 MED ORDER — FENTANYL CITRATE (PF) 250 MCG/5ML IJ SOLN
INTRAMUSCULAR | Status: DC | PRN
  Administered 2018-01-08: 25 ug via INTRAVENOUS
  Administered 2018-01-08: 50 ug via INTRAVENOUS
  Administered 2018-01-08: 25 ug via INTRAVENOUS
  Administered 2018-01-08: 50 ug via INTRAVENOUS

## 2018-01-08 MED ORDER — LIDOCAINE 2% (20 MG/ML) 5 ML SYRINGE
INTRAMUSCULAR | Status: AC
  Filled 2018-01-08: qty 5

## 2018-01-08 MED ORDER — ONDANSETRON 4 MG PO TBDP: 4.0000 mg | ORAL_TABLET | Freq: Four times a day (QID) | ORAL | Status: DC | PRN

## 2018-01-08 MED ORDER — MIDAZOLAM HCL 2 MG/2ML IJ SOLN
INTRAMUSCULAR | Status: AC
  Filled 2018-01-08: qty 2

## 2018-01-08 MED ORDER — LIDOCAINE 2% (20 MG/ML) 5 ML SYRINGE
INTRAMUSCULAR | Status: DC | PRN
  Administered 2018-01-08: 100 mg via INTRAVENOUS

## 2018-01-08 MED ORDER — DEXAMETHASONE SODIUM PHOSPHATE 10 MG/ML IJ SOLN
INTRAMUSCULAR | Status: DC | PRN
  Administered 2018-01-08: 5 mg via INTRAVENOUS

## 2018-01-08 MED ORDER — LACTATED RINGERS IV SOLN
INTRAVENOUS | Status: DC | PRN
  Administered 2018-01-08: 07:00:00 via INTRAVENOUS

## 2018-01-08 MED ORDER — BUPIVACAINE-EPINEPHRINE (PF) 0.25% -1:200000 IJ SOLN
INTRAMUSCULAR | Status: AC
  Filled 2018-01-08: qty 30

## 2018-01-08 MED ORDER — DIPHENHYDRAMINE HCL 50 MG/ML IJ SOLN: 12.5000 mg | Freq: Four times a day (QID) | INTRAMUSCULAR | Status: DC | PRN

## 2018-01-08 MED ORDER — ONDANSETRON HCL 4 MG/2ML IJ SOLN
INTRAMUSCULAR | Status: DC | PRN
  Administered 2018-01-08: 4 mg via INTRAVENOUS

## 2018-01-08 MED ORDER — LIDOCAINE-EPINEPHRINE 1 %-1:100000 IJ SOLN
INTRAMUSCULAR | Status: DC | PRN
  Administered 2018-01-08: 20 mL

## 2018-01-08 MED ORDER — GABAPENTIN 800 MG PO TABS
800.0000 mg | ORAL_TABLET | Freq: Four times a day (QID) | ORAL | Status: DC
  Filled 2018-01-08: qty 1

## 2018-01-08 SURGICAL SUPPLY — 56 items
ADH SKN CLS APL DERMABOND .7 (GAUZE/BANDAGES/DRESSINGS) ×2
APL SKNCLS STERI-STRIP NONHPOA (GAUZE/BANDAGES/DRESSINGS) ×2
APPLICATOR COTTON TIP 6IN STRL (MISCELLANEOUS) IMPLANT
BAG DECANTER FOR FLEXI CONT (MISCELLANEOUS) IMPLANT
BENZOIN TINCTURE PRP APPL 2/3 (GAUZE/BANDAGES/DRESSINGS) ×4 IMPLANT
BNDG GAUZE ELAST 4 BULKY (GAUZE/BANDAGES/DRESSINGS) ×2 IMPLANT
CANISTER SUCT 3000ML PPV (MISCELLANEOUS) ×4 IMPLANT
CONT SPEC 4OZ CLIKSEAL STRL BL (MISCELLANEOUS) IMPLANT
COVER SURGICAL LIGHT HANDLE (MISCELLANEOUS) ×4 IMPLANT
DERMABOND ADVANCED (GAUZE/BANDAGES/DRESSINGS) ×2
DERMABOND ADVANCED .7 DNX12 (GAUZE/BANDAGES/DRESSINGS) IMPLANT
DRAIN CHANNEL 15F RND FF W/TCR (WOUND CARE) ×2 IMPLANT
DRAPE HALF SHEET 40X57 (DRAPES) IMPLANT
DRAPE IMP U-DRAPE 54X76 (DRAPES) ×4 IMPLANT
DRAPE INCISE IOBAN 66X45 STRL (DRAPES) IMPLANT
DRAPE LAPAROSCOPIC ABDOMINAL (DRAPES) IMPLANT
DRAPE LAPAROTOMY 100X72 PEDS (DRAPES) ×4 IMPLANT
DRESSING HYDROCOLLOID 4X4 (GAUZE/BANDAGES/DRESSINGS) ×4 IMPLANT
DRSG ADAPTIC 3X8 NADH LF (GAUZE/BANDAGES/DRESSINGS) IMPLANT
DRSG PAD ABDOMINAL 8X10 ST (GAUZE/BANDAGES/DRESSINGS) ×2 IMPLANT
DRSG VAC ATS LRG SENSATRAC (GAUZE/BANDAGES/DRESSINGS) IMPLANT
DRSG VAC ATS MED SENSATRAC (GAUZE/BANDAGES/DRESSINGS) IMPLANT
DRSG VAC ATS SM SENSATRAC (GAUZE/BANDAGES/DRESSINGS) IMPLANT
ELECT CAUTERY BLADE 6.4 (BLADE) IMPLANT
ELECT REM PT RETURN 9FT ADLT (ELECTROSURGICAL) ×4
ELECTRODE REM PT RTRN 9FT ADLT (ELECTROSURGICAL) ×2 IMPLANT
EVACUATOR SILICONE 100CC (DRAIN) ×2 IMPLANT
GAUZE SPONGE 4X4 12PLY STRL (GAUZE/BANDAGES/DRESSINGS) IMPLANT
GLOVE BIO SURGEON STRL SZ 6.5 (GLOVE) ×4 IMPLANT
GLOVE BIO SURGEONS STRL SZ 6.5 (GLOVE) ×2
GOWN STRL REUS W/ TWL LRG LVL3 (GOWN DISPOSABLE) ×6 IMPLANT
GOWN STRL REUS W/TWL LRG LVL3 (GOWN DISPOSABLE) ×12
KIT BASIN OR (CUSTOM PROCEDURE TRAY) ×4 IMPLANT
KIT TURNOVER KIT B (KITS) ×4 IMPLANT
MATRIX GENTRIX 6X15 (Tissue) ×2 IMPLANT
MICROMATRIX 1000MG (Tissue) ×4 IMPLANT
NDL HYPO 25GX1X1/2 BEV (NEEDLE) ×2 IMPLANT
NEEDLE HYPO 25GX1X1/2 BEV (NEEDLE) ×4 IMPLANT
NS IRRIG 1000ML POUR BTL (IV SOLUTION) ×4 IMPLANT
PACK GENERAL/GYN (CUSTOM PROCEDURE TRAY) ×4 IMPLANT
PACK UNIVERSAL I (CUSTOM PROCEDURE TRAY) ×4 IMPLANT
PAD ARMBOARD 7.5X6 YLW CONV (MISCELLANEOUS) ×8 IMPLANT
SOLUTION PARTIC MCRMTRX 1000MG (Tissue) IMPLANT
STAPLER VISISTAT 35W (STAPLE) ×4 IMPLANT
SURGILUBE 2OZ TUBE FLIPTOP (MISCELLANEOUS) IMPLANT
SUT ETHILON 3 0 PS 1 (SUTURE) ×2 IMPLANT
SUT MNCRL AB 3-0 PS2 18 (SUTURE) ×8 IMPLANT
SUT MNCRL AB 4-0 PS2 18 (SUTURE) ×4 IMPLANT
SUT MON AB 5-0 PS2 18 (SUTURE) ×2 IMPLANT
SUT PROLENE 5 0 PS 2 (SUTURE) ×2 IMPLANT
SUT VIC AB 5-0 PS2 18 (SUTURE) IMPLANT
SWAB COLLECTION DEVICE MRSA (MISCELLANEOUS) IMPLANT
SWAB CULTURE ESWAB REG 1ML (MISCELLANEOUS) IMPLANT
SYR CONTROL 10ML LL (SYRINGE) ×4 IMPLANT
TOWEL OR 17X26 10 PK STRL BLUE (TOWEL DISPOSABLE) ×4 IMPLANT
UNDERPAD 30X30 (UNDERPADS AND DIAPERS) ×4 IMPLANT

## 2018-01-08 NOTE — Anesthesia Postprocedure Evaluation (Signed)
Anesthesia Post Note  Patient: Patrick Aguilar  Procedure(s) Performed: EXCISION OF GROIN WOUND WITH PLACEMENT OF ACELL, AND PRIMARY WOUND CLOSURE (N/A Scrotum) APPLICATION OF A-CELL OF GROIN (N/A Groin)     Patient location during evaluation: PACU Anesthesia Type: General Level of consciousness: awake Pain management: pain level controlled Vital Signs Assessment: post-procedure vital signs reviewed and stable Respiratory status: spontaneous breathing Cardiovascular status: stable Anesthetic complications: no    Last Vitals:  Vitals:   01/08/18 1045 01/08/18 1106  BP:  (!) 154/97  Pulse: 72 87  Resp: (!) 8   Temp: 36.7 C 36.6 C  SpO2: 93% 95%    Last Pain:  Vitals:   01/08/18 1106  TempSrc: Oral  PainSc:                  Tailey Top

## 2018-01-08 NOTE — Transfer of Care (Signed)
Immediate Anesthesia Transfer of Care Note  Patient: Patrick Aguilar  Procedure(s) Performed: EXCISION OF GROIN WOUND WITH PLACEMENT OF ACELL, AND PRIMARY WOUND CLOSURE (N/A Scrotum) APPLICATION OF A-CELL OF GROIN (N/A Groin)  Patient Location: PACU  Anesthesia Type:General  Level of Consciousness: awake, alert  and oriented  Airway & Oxygen Therapy: Patient Spontanous Breathing and Patient connected to nasal cannula oxygen  Post-op Assessment: Report given to RN and Post -op Vital signs reviewed and stable  Post vital signs: Reviewed and stable  Last Vitals:  Vitals Value Taken Time  BP 125/83 01/08/2018  9:09 AM  Temp    Pulse 99 01/08/2018  9:10 AM  Resp 21 01/08/2018  9:10 AM  SpO2 92 % 01/08/2018  9:10 AM  Vitals shown include unvalidated device data.  Last Pain:  Vitals:   01/08/18 0628  TempSrc: Oral  PainSc:          Complications: No apparent anesthesia complications

## 2018-01-08 NOTE — Plan of Care (Signed)

## 2018-01-08 NOTE — Interval H&P Note (Signed)
History and Physical Interval Note:  01/08/2018 7:13 AM  Patrick Aguilar  has presented today for surgery, with the diagnosis of Fournier's Gangrene Of Scrotum  The various methods of treatment have been discussed with the patient and family. After consideration of risks, benefits and other options for treatment, the patient has consented to  Procedure(s): IRRIGATION AND DEBRIDEMENT OF SCROTAL WOUND WITH PLACEMENT OF ACELL, REPLACE LEFT TESTICLE IN THIGH POCKET AND POSSIBLE PARTIAL CLOSURE OF WOUND (Left) as a surgical intervention .  The patient's history has been reviewed, patient examined, no change in status, stable for surgery.  I have reviewed the patient's chart and labs.  Questions were answered to the patient's satisfaction.     [name]

## 2018-01-08 NOTE — Op Note (Signed)
First Assist Op Note: [facility] I assisted the Surgeon(s) ____Dr. Alan Ripper Dillingham_ on the procedure(s): __Excisional debridement and advancement bilateral thigh flap for coverage of testicles ,and placement of Acell with primary closure of groin wound _on Date __4/29/19_______  I provided my assistance on this case as follows:  I was present and acted as first Geophysicist/field seismologist during this operation. I was present during the patient transport into the operative suite and assisted the OR staff with transferring and positioning of the patient. All extremities were checked and properly cushioned and safety straps in place. I was involved in the prepping and placement of sterile drapes. A time out was performed and all information confirmed to be correct.  I first assisted during the case including retraction for exposure, assisting with closure of surgical wounds and application of sterile dressings. I provided assistance with application of post operative garments/splinting and assisted with patient transfer back to the stretcher as needed.   Zalen Sequeira,PA-C Plastic Surgery [phone]

## 2018-01-08 NOTE — Anesthesia Procedure Notes (Signed)
Procedure Name: LMA Insertion Date/Time: 01/08/2018 7:37 AM Performed by: Elliot Dally, CRNA Pre-anesthesia Checklist: Patient identified, Emergency Drugs available, Suction available and Patient being monitored Patient Re-evaluated:Patient Re-evaluated prior to induction Oxygen Delivery Method: Circle System Utilized Preoxygenation: Pre-oxygenation with 100% oxygen Induction Type: IV induction Ventilation: Mask ventilation without difficulty LMA: LMA inserted LMA Size: 4.0 Number of attempts: 1 Airway Equipment and Method: Bite block Placement Confirmation: positive ETCO2 Tube secured with: Tape Dental Injury: Teeth and Oropharynx as per pre-operative assessment

## 2018-01-08 NOTE — Anesthesia Preprocedure Evaluation (Signed)
Anesthesia Evaluation  Patient identified by MRN, date of birth, ID band Patient awake    Reviewed: Allergy & Precautions, NPO status , Patient's Chart, lab work & pertinent test results  Airway Mallampati: II  TM Distance: >3 FB     Dental   Pulmonary shortness of breath, COPD, former smoker,    breath sounds clear to auscultation       Cardiovascular hypertension,  Rhythm:Regular Rate:Normal     Neuro/Psych    GI/Hepatic Neg liver ROS, GERD  ,  Endo/Other  diabetes  Renal/GU negative Renal ROS     Musculoskeletal   Abdominal   Peds  Hematology   Anesthesia Other Findings   Reproductive/Obstetrics                             Anesthesia Physical Anesthesia Plan  ASA: III  Anesthesia Plan: General   Post-op Pain Management:    Induction: Intravenous  PONV Risk Score and Plan: Treatment may vary due to age or medical condition, Ondansetron, Dexamethasone and Midazolam  Airway Management Planned:   Additional Equipment:   Intra-op Plan:   Post-operative Plan: Possible Post-op intubation/ventilation  Informed Consent: I have reviewed the patients History and Physical, chart, labs and discussed the procedure including the risks, benefits and alternatives for the proposed anesthesia with the patient or authorized representative who has indicated his/her understanding and acceptance.   Dental advisory given  Plan Discussed with: CRNA and Anesthesiologist  Anesthesia Plan Comments:         Anesthesia Quick Evaluation

## 2018-01-08 NOTE — Op Note (Signed)
DATE OF OPERATION: 01/08/2018  LOCATION: [facility]   PREOPERATIVE DIAGNOSIS: Groin wound  POSTOPERATIVE DIAGNOSIS: Same  PROCEDURE:  1. Advancement of bilateral thigh flap 5 x 5 cm.  2. Excisional of skin and soft tissue 5 x 10 cm groin wound 3. Placement of Acell (1 gm powder and 7 x 10 cm sheet) 4. Primary closure of 10 wound  SURGEON: Claire Sanger Dillingham, DO  EBL: 10 cc  CONDITION: Stable  COMPLICATIONS: None  INDICATION: The patient, Patrick Aguilar, is a 67 y.o. male born on 08/04/1951, is here for treatment of a groin wound after a serious infection.   PROCEDURE DETAILS:  The patient was seen prior to surgery and marked.  The IV antibiotics were given. The patient was taken to the operating room and given a general anesthetic. A standard time out was performed and all information was confirmed by those in the room. SCDs were placed.   The area was irrigated with antibiotic solution and saline.  The #10 blade was used to excise the skin of the 5 x 10 cm wound at the edges and then several deep areas.  The bovie was used for hemostasis.   The bilateral thigh flaps were undermined 5 cm to the lateral superior area.  This provided a deep pocket for the testicles to rotate into and then advance the flap to the midline.  The deep layers were closed with the 3-0 Monocryl. A Drain was placed and secured to the skin with a 4-0 Ethibond. The 7 x 10 cm Acell sheet and all of the powder were placed at the base of the wound.   A combination of 4-0 Monocryl, 5-0 Monocryl and 5-0 Prolene were used with simple and vertical mattress sutures to close the skin after the thigh flaps were advanced to midline.  Derma bond was applied.  The patient was allowed to wake up and taken to recovery room in stable condition at the end of the case. The family was notified at the end of the case.

## 2018-01-09 ENCOUNTER — Encounter (HOSPITAL_COMMUNITY): Payer: Self-pay | Admitting: Plastic Surgery

## 2018-01-09 DIAGNOSIS — R3915 Urgency of urination: Secondary | ICD-10-CM | POA: Diagnosis present

## 2018-01-09 DIAGNOSIS — E119 Type 2 diabetes mellitus without complications: Secondary | ICD-10-CM | POA: Diagnosis present

## 2018-01-09 DIAGNOSIS — Z87891 Personal history of nicotine dependence: Secondary | ICD-10-CM | POA: Diagnosis not present

## 2018-01-09 DIAGNOSIS — K219 Gastro-esophageal reflux disease without esophagitis: Secondary | ICD-10-CM | POA: Diagnosis present

## 2018-01-09 DIAGNOSIS — I10 Essential (primary) hypertension: Secondary | ICD-10-CM | POA: Diagnosis present

## 2018-01-09 DIAGNOSIS — Z79899 Other long term (current) drug therapy: Secondary | ICD-10-CM | POA: Diagnosis not present

## 2018-01-09 DIAGNOSIS — J449 Chronic obstructive pulmonary disease, unspecified: Secondary | ICD-10-CM | POA: Diagnosis present

## 2018-01-09 DIAGNOSIS — Z7984 Long term (current) use of oral hypoglycemic drugs: Secondary | ICD-10-CM | POA: Diagnosis not present

## 2018-01-09 DIAGNOSIS — S31109A Unspecified open wound of abdominal wall, unspecified quadrant without penetration into peritoneal cavity, initial encounter: Secondary | ICD-10-CM | POA: Diagnosis present

## 2018-01-09 LAB — GLUCOSE, CAPILLARY: Glucose-Capillary: 131 mg/dL — ABNORMAL HIGH (ref 65–99)

## 2018-01-09 MED ORDER — ACETAMINOPHEN 325 MG PO TABS: 325.0000 mg | ORAL_TABLET | Freq: Four times a day (QID) | ORAL | Status: DC | PRN

## 2018-01-09 NOTE — Care Management Note (Signed)
Case Management Note  Patient Details  Name: Patrick Aguilar MRN: [id number] Date of Birth: 1951-05-26  Subjective/Objective:                    Action/Plan:  Patient lives with daughter . Is active with [facility] for RN,OT,PT,SW   Expected Discharge Date:                  Expected Discharge Plan:  Home w Home Health Services  In-House Referral:     Discharge planning Services  CM Consult  Post Acute Care Choice:  NA, Home Health Choice offered to:  Patient  DME Arranged:  N/A DME Agency:  NA  HH Arranged:  RN, PT, OT HH Agency:  [facility]  Status of Service:  In process, will continue to follow  If discussed at Long Length of Stay Meetings, dates discussed:    Additional Comments:  Kingsley Plan, RN 01/09/2018, 2:05 PM

## 2018-01-09 NOTE — Evaluation (Signed)
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  He has COPD, GERD, HTN, anxiety.  He has completed surgical treatments for his Fournier's gangrene .Readmitted for plastic surgery to groin and testicles.   Clinical Impression  Pt recently discharged from hospital to daughter's house to recuperate from surgical treatment for Fournier's gangrene when he experienced complications and returned to hospital for additional surgery. Pt had begun HHPT and OT after discharge and required help from daughter for bathing and dressing and was ambulating household distances with RW. Pt is currently limited by pain. Pt is currently minA for bed mobility, supervision for transfers and min guard for ambulation of 50 feet with RW. Pt would benefit from return to HHPT at d/c to continue strengthening and improve endurance for eventual return to his own home. PT will follow acutely until d/c.     Follow Up Recommendations Home health PT    Equipment Recommendations  None recommended by PT    Recommendations for Other Services OT consult     Precautions / Restrictions Precautions Precautions: Fall Restrictions Weight Bearing Restrictions: No      Mobility  Bed Mobility Overal bed mobility: Needs Assistance Bed Mobility: Supine to Sit;Sit to Supine     Supine to sit: Min assist Sit to supine: Min assist   General bed mobility comments: min A for trunk to upright to sit EoB and minA for LE management into bed for sit to supine  Transfers Overall transfer level: Needs assistance Equipment used: Rolling walker (2 wheeled) Transfers: Sit to/from Stand Sit to Stand: From elevated surface;Supervision         General transfer comment: able to power up and steady with only supervision for  safety  Ambulation/Gait Ambulation/Gait assistance: Min guard Ambulation Distance (Feet): 50 Feet Assistive device: Rolling walker (2 wheeled) Gait Pattern/deviations: Step-through pattern;Decreased stride length;Wide base of support Gait velocity: decreased Gait velocity interpretation: <1.8 ft/sec, indicate of risk for recurrent falls General Gait Details: min guard for safety, slow, mildly unsteady gait, no overt LoB, request to return to room due to increase in pain with ambulation         Balance Overall balance assessment: Needs assistance Sitting-balance support: Feet supported;No upper extremity supported Sitting balance-Leahy Scale: Fair     Standing balance support: Bilateral upper extremity supported Standing balance-Leahy Scale: Fair Standing balance comment: reliant on RW for support                             Pertinent Vitals/Pain Pain Assessment: 0-10 Pain Score: 10-Worst pain ever Pain Location: scrotum, buttocks, inner thighs Pain Descriptors / Indicators: Burning;Discomfort Pain Intervention(s): Limited activity within patient's tolerance;Monitored during session;Repositioned    Home Living Family/patient expects to be discharged to:: Private residence Living Arrangements: Children Available Help at Discharge: Family;Available 24 hours/day Type of Home: House Home Access: Stairs to enter   [employer] of Steps: 1 Home Layout: One level Home Equipment: Emergency planning/management officer - 2 wheels;Bedside commode      Prior Function Level of Independence: Independent;Needs assistance   Gait / Transfers Assistance Needed: using RW  ADL's / Homemaking Assistance Needed: minimal assist for bathing, dressing and cooking   Comments: was recouperating with daughter     Hand Dominance        Extremity/Trunk  Assessment   Upper Extremity Assessment Upper Extremity Assessment: Generalized weakness(bilateral shoulder problems with decreased  ROM)    Lower Extremity Assessment Lower Extremity Assessment: (P) RLE deficits/detail;LLE deficits/detail       Communication   Communication: No difficulties  Cognition Arousal/Alertness: Awake/alert Behavior During Therapy: WFL for tasks assessed/performed Overall Cognitive Status: Within Functional Limits for tasks assessed                                               Assessment/Plan    PT Assessment Patient needs continued PT services  PT Problem List Decreased strength;Decreased range of motion;Decreased activity tolerance;Decreased balance;Decreased mobility;Decreased knowledge of use of DME;Pain       PT Treatment Interventions DME instruction;Gait training;Functional mobility training;Therapeutic activities;Therapeutic exercise;Balance training;Patient/family education    PT Goals (Current goals can be found in the Care Plan section)  Acute Rehab PT Goals Patient Stated Goal: to heal and not come back to hospital PT Goal Formulation: With patient Time For Goal Achievement: 01/23/18 Potential to Achieve Goals: Good    Frequency Min 3X/week    AM-PAC PT "6 Clicks" Daily Activity  Outcome Measure Difficulty turning over in bed (including adjusting bedclothes, sheets and blankets)?: A Lot Difficulty moving from lying on back to sitting on the side of the bed? : Unable Difficulty sitting down on and standing up from a chair with arms (e.g., wheelchair, bedside commode, etc,.)?: Unable Help needed moving to and from a bed to chair (including a wheelchair)?: A Little Help needed walking in hospital room?: A Little Help needed climbing 3-5 steps with a railing? : A Lot 6 Click Score: 12    End of Session Equipment Utilized During Treatment: Gait belt Activity Tolerance: Patient tolerated treatment well Patient left: in bed;with call bell/phone within reach Nurse Communication: Mobility status PT Visit Diagnosis: Muscle weakness (generalized)  (M62.81);Other abnormalities of gait and mobility (R26.89);Pain Pain - part of body: (groin, inner thighs)    Time: 1610-9604 PT Time Calculation (min) (ACUTE ONLY): 30 min   Charges:   PT Evaluation $PT Eval Low Complexity: 1 Low PT Treatments $Gait Training: 8-22 mins   PT G Codes:        Cynithia Hakimi B. Beverely Risen PT, DPT Acute Rehabilitation  [phone] Pager [phone]    Elon Alas Fleet 01/09/2018, 1:34 PM

## 2018-01-09 NOTE — Care Management Obs Status (Signed)
MEDICARE OBSERVATION STATUS NOTIFICATION   Patient Details  Name: CARLAS VANDYNE MRN: [id number] Date of Birth: [birth year]   Medicare Observation Status Notification Given:  Yes    Lawerance Sabal, RN 01/09/2018, 9:29 AM

## 2018-01-10 LAB — URINALYSIS, ROUTINE W REFLEX MICROSCOPIC
BILIRUBIN URINE: NEGATIVE
GLUCOSE, UA: NEGATIVE mg/dL
KETONES UR: NEGATIVE mg/dL
LEUKOCYTES UA: NEGATIVE
NITRITE: NEGATIVE
Protein, ur: NEGATIVE mg/dL
Specific Gravity, Urine: 1.015 (ref 1.005–1.030)
pH: 7 (ref 5.0–8.0)

## 2018-01-10 NOTE — Progress Notes (Signed)
2 Days Post-Op   Subjective/Chief Complaint: Patient reports did not sleep well due to urinary urgency and frequency, but improved once condom cath placed. He denies pain with urination. Will check UA and UCx and also decrease IVF to kvo ( still on IV Ancef).  He also reports some bloody drainage from the incision when he was using the bathroom yesterday. The JP drain is functioning well and there is serosanguinous drainage in the bulb this am.  Incisions over the scrotum/perineum are intact, mild edema/erythema of the area. Testes remain in medial thighs.   Objective: Vital signs in last 24 hours: Temp:  [98.3 F (36.8 C)-98.9 F (37.2 C)] 98.3 F (36.8 C) (05/01 0624) Pulse Rate:  [75-85] 80 (05/01 0637) Resp:  [18-20] 18 (05/01 0624) BP: (132-167)/(85-102) 146/98 (05/01 0637) SpO2:  [95 %-97 %] 97 % (05/01 0624) Weight:  [84.9 kg (187 lb 2.7 oz)] 84.9 kg (187 lb 2.7 oz) (05/01 0500) Last BM Date: 01/09/18  Intake/Output from previous day: 04/30 0701 - 05/01 0700 In: 1434.2 [P.O.:240; I.V.:1194.2] Out: 2755 [Urine:1525; Drains:30] Intake/Output this shift: No intake/output data recorded.    Lab Results:  No results for input(s): WBC, HGB, HCT, PLT in the last 72 hours. BMET No results for input(s): NA, K, CL, CO2, GLUCOSE, BUN, CREATININE, CALCIUM in the last 72 hours. PT/INR No results for input(s): LABPROT, INR in the last 72 hours. ABG No results for input(s): PHART, HCO3 in the last 72 hours.  Invalid input(s): PCO2, PO2  Studies/Results: No results found.  Anti-infectives: Anti-infectives (From admission, onward)   Start     Dose/Rate Route Frequency Ordered Stop   01/08/18 1400  ceFAZolin (ANCEF) IVPB 2g/100 mL premix     2 g 200 mL/hr over 30 Minutes Intravenous Every 8 hours 01/08/18 1105     01/08/18 0805  polymyxin B 500,000 Units, bacitracin 50,000 Units in sodium chloride 0.9 % 500 mL irrigation  Status:  Discontinued       As needed 01/08/18 0806  01/08/18 0907   01/08/18 0546  ceFAZolin (ANCEF) IVPB 2g/100 mL premix     2 g 200 mL/hr over 30 Minutes Intravenous On call to O.R. 01/08/18 0546 01/08/18 0740      Assessment/Plan: s/p Procedure(s): EXCISION OF GROIN WOUND WITH PLACEMENT OF ACELL, AND PRIMARY WOUND CLOSURE (N/A) APPLICATION OF A-CELL OF GROIN (N/A) Will check UA and UCx.   Continue IV Ancef for now.  Continue to mobilize with therapies avoiding separating legs anymore than necessary.    LOS: 1 day   [facility] [phone]

## 2018-01-10 NOTE — Care Management Note (Signed)
Case Management Note  Patient Details  Name: Patrick Aguilar MRN: [id number] Date of Birth: 02-Feb-1951  Subjective/Objective:                    Action/Plan:   Expected Discharge Date:                  Expected Discharge Plan:  Home w Home Health Services  In-House Referral:     Discharge planning Services  CM Consult  Post Acute Care Choice:  NA, Home Health Choice offered to:  Patient  DME Arranged:  N/A DME Agency:  NA  HH Arranged:  RN, PT, OT, Social Work [employer] Agency:  [facility]  Status of Service:  Completed, signed off  If discussed at [employer], dates discussed:    Additional Comments:  Kingsley Plan, RN 01/10/2018, 8:16 AM

## 2018-01-10 NOTE — Progress Notes (Addendum)
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  He has COPD, GERD, HTN, anxiety.  He has completed surgical treatments for his Fournier's gangrene .Readmitted for plastic surgery to groin and testicles. Pt s/p excision of groin wound with placement of A-cell and wound closure on 01/08/18.  post op instructions to limit spreading of legs.      PT Comments    Pt is concerned about returning home where he would be home alone during the first few days after d/c and is worried about being able to care for himself and his wound without assistance.  Prior to his previous hospitalization he was completely independent working on "my farm" and did not use an assistive device for gait.  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    Follow Up Recommendations  SNF     Equipment Recommendations  None recommended by PT    Recommendations for Other Services   NA     Precautions / Restrictions Precautions Precautions: Fall Precaution Comments: L JP drain , in PA's note, avoid spreading legs apart   Mobility  Bed Mobility Overal bed mobility: Needs Assistance Bed Mobility: Rolling;Sidelying to Sit     Supine to sit: Supervision Sit to supine: Supervision   General bed mobility comments: supervision for safety with cues to maintain BLE as close together as possible  Transfers Overall transfer level: Needs assistance Equipment used: Rolling walker (2 wheeled) Transfers: Sit to/from Stand Sit to Stand: Min guard         General transfer comment: Min guard assist for safety, especially standing EOB as pt trying  to re adjust the dressing in his mesh underwear.   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  Pt was reliant on RW for stability, min guard assist for balance.           Balance Overall balance assessment: Needs assistance Sitting-balance support: Feet supported;Bilateral upper extremity supported Sitting balance-Leahy Scale: Fair Sitting balance - Comments: Pt having to unweight his private area with bil hands seated EOB.    Standing balance support: Bilateral upper extremity supported;No upper extremity supported;Single extremity supported Standing balance-Leahy Scale: Fair Standing balance comment: static standing with close supervision, anything dynamic requires assist.                             Cognition Arousal/Alertness: Awake/alert Behavior During Therapy: WFL for tasks assessed/performed Overall Cognitive Status: Within Functional Limits for tasks assessed                                 General Comments: Not specifically tested, but conversation normal.              Pertinent Vitals/Pain Pain Assessment: Faces Faces Pain Scale: Hurts even more Pain Location:  scrotum, buttocks, inner thighs Pain Descriptors / Indicators: Burning;Discomfort Pain Intervention(s): Limited activity within patient's tolerance;Monitored during session;Repositioned           PT Goals (current goals can now be found in the care plan section) Acute Rehab PT Goals Patient Stated Goal: to heal and not come back to hospital Progress towards PT goals: Progressing toward goals    Frequency    Min 2X/week      PT Plan Discharge plan needs to be updated        AM-PAC PT "6 Clicks" Daily Activity  Outcome Measure  Difficulty turning over in bed (including adjusting bedclothes, sheets and blankets)?: A Little Difficulty moving from lying on back to sitting on the side of the bed? : A Little Difficulty sitting down on and standing up from a chair with arms (e.g., wheelchair, bedside commode, etc,.)?: A Little Help needed moving to and from a bed to chair (including a wheelchair)?: A Little Help needed walking in hospital room?: A Little Help needed climbing 3-5 steps with a railing? : A Lot 6 Click Score: 17    End of Session   Activity Tolerance: Patient limited by pain Patient left: in chair;with call bell/phone within reach;Other (comment)(with OT present. )   PT Visit Diagnosis: Muscle weakness (generalized) (M62.81);Other abnormalities of gait and mobility (R26.89);Pain Pain - Right/Left: (groin) Pain - part of body: (groin)     Time: 1610-9604 PT Time Calculation (min) (ACUTE ONLY): 15 min  Charges:  $Gait Training: 8-22 mins          Kerline Trahan B. Juriel Cid, PT, DPT [phone]            01/10/2018, 4:17 PM

## 2018-01-10 NOTE — Care Management Note (Addendum)
Case Management Note  Patient Details  Name: AARYAV HOPFENSPERGER MRN: [id number] Date of Birth: 02-23-1951  Subjective/Objective:                    Action/Plan:  Spoke to patient and daughter Juliette Alcide) via phone with patient's consent. Daughter is concerned with patient going home with home health . Feels her father needs short term rehab. Juliette Alcide would like her father reassessed . Spoke with Toniann Fail in PT office. Patient is down as priority in morning. Melinda and patient aware. Juliette Alcide wants to be called as soon as patient is re evaluated     Ronny Flurry RN BSN [phone]  Expected Discharge Date:                  Expected Discharge Plan:  Home w Home Health Services  In-House Referral:  Clinical Social Work  Discharge planning Services  CM Consult  Post Acute Care Choice:  NA, Home Health Choice offered to:  Patient  DME Arranged:  N/A DME Agency:  NA  HH Arranged:  RN, PT, OT, Social Work [employer] Agency:  [facility]  Status of Service:  In process, will continue to follow  If discussed at Long Length of Stay Meetings, dates discussed:    Additional Comments:  Kingsley Plan, RN 01/10/2018, 3:23 PM

## 2018-01-10 NOTE — Care Management Note (Signed)
Case Management Note  Patient Details  Name: Patrick Aguilar MRN: [id number] Date of Birth: 06-Sep-1951  Subjective/Objective:                    Action/Plan: PT re evaluated patient , recommending SNF now. Patient ambulated 70 feet.   Called daughter   [name] (Daughter)  Showing 1 of 1    [phone]      Left voicemail with PT's new recommendation and that NCM will consult SW to see if insurance will approve SNF.   Expected Discharge Date:                  Expected Discharge Plan:  Skilled Nursing Facility  In-House Referral:  Clinical Social Work  Discharge planning Services  CM Consult  Post Acute Care Choice:  NA, Home Health Choice offered to:  Patient  DME Arranged:  N/A DME Agency:  NA  HH Arranged:  RN, PT, OT, Social Work [employer] Agency:  [facility]  Status of Service:  In process, will continue to follow  If discussed at Long Length of Stay Meetings, dates discussed:    Additional Comments:  Kingsley Plan, RN 01/10/2018, 4:27 PM

## 2018-01-10 NOTE — Evaluation (Signed)
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  He has COPD, GERD, HTN, anxiety.  He has completed surgical treatments for his Fournier's gangrene .Readmitted for plastic surgery to groin and testicles.    Clinical Impression   PTA, pt was staying with his daughter and participating in home health OT. He reports requiring min assist for bathing and dressing tasks. He was able to complete LB dressing tasks with supervision this session, toilet transfers with close supervision and RW, and standing grooming tasks with supervision. Educated pt concerning use of 3-in-1 over commode for safe and more comfortable toilet transfers and strategies to minimize abduction of B LE during ADL tasks. He would benefit from continued OT services while admitted and recommend continued home health OT once stable for D/C home.     Follow Up Recommendations  Home health OT;Supervision/Assistance - 24 hour    Equipment Recommendations  3 in 1 bedside commode    Recommendations for Other Services       Precautions / Restrictions Precautions Precautions: Fall Restrictions Weight Bearing Restrictions: No      Mobility Bed Mobility Overal bed mobility: Needs Assistance Bed Mobility: Supine to Sit;Sit to Supine     Supine to sit: Supervision Sit to supine: Supervision   General bed mobility comments: supervision for safety with cues to maintain BLE as close together as possible  Transfers Overall transfer level: Needs assistance Equipment used: Rolling walker (2 wheeled) Transfers: Sit to/from Stand Sit to Stand: Min guard         General transfer comment: Min guard assist to power up for stability.     Balance Overall balance assessment: Needs assistance Sitting-balance support: Feet supported;No upper  extremity supported Sitting balance-Leahy Scale: Fair     Standing balance support: Bilateral upper extremity supported Standing balance-Leahy Scale: Fair Standing balance comment: reliant on RW for support                           ADL either performed or assessed with clinical judgement   ADL Overall ADL's : Needs assistance/impaired Eating/Feeding: Set up;Sitting   Grooming: Standing;Supervision/safety   Upper Body Bathing: Set up;Sitting   Lower Body Bathing: Supervison/ safety;Sit to/from stand   Upper Body Dressing : Set up;Sitting   Lower Body Dressing: Supervision/safety;Sit to/from stand   Toilet Transfer: RW;Supervision/safety;Ambulation;BSC(BSC over toilet)   Toileting- Clothing Manipulation and Hygiene: Sit to/from stand;Supervision/safety       Functional mobility during ADLs: Supervision/safety;Rolling walker General ADL Comments: greatest limitation is groin area pain. Educated pt concerning methods to prevent abduction of BLE during ADL.      Vision Baseline Vision/History: Wears glasses Wears Glasses: At all times Patient Visual Report: No change from baseline Vision Assessment?: No apparent visual deficits     Perception     Praxis      Pertinent Vitals/Pain Pain Assessment: Faces Faces Pain Scale: Hurts little more Pain Location: scrotum, buttocks, inner thighs Pain Descriptors / Indicators: Burning;Discomfort Pain Intervention(s): Limited activity within patient's tolerance;Monitored during session;Repositioned     Hand Dominance     Extremity/Trunk Assessment Upper Extremity Assessment Upper Extremity Assessment: Generalized weakness(B shoulder diminished ROM)   Lower Extremity Assessment Lower Extremity Assessment: Defer to PT evaluation       Communication Communication Communication: No difficulties  Cognition Arousal/Alertness: Awake/alert Behavior During Therapy: WFL for tasks assessed/performed Overall  Cognitive Status: Within Functional Limits for tasks assessed                                     General Comments       Exercises     Shoulder Instructions      Home Living Family/patient expects to be discharged to:: Private residence Living Arrangements: Children(lives with nephew but staying with daughter) Available Help at Discharge: Family;Available 24 hours/day Type of Home: House Home Access: Stairs to enter [employer] of Steps: 1   Home Layout: One level     Bathroom Shower/Tub: Producer, television/film/video: Standard Bathroom Accessibility: Yes   Home Equipment: Emergency planning/management officer - 2 wheels;Bedside commode          Prior Functioning/Environment Level of Independence: Needs assistance  Gait / Transfers Assistance Needed: using RW ADL's / Homemaking Assistance Needed: minimal assist for bathing, dressing and cooking    Comments: was recouperating with daughter        OT Problem List: Decreased strength;Decreased range of motion;Decreased activity tolerance;Impaired balance (sitting and/or standing);Decreased safety awareness;Decreased knowledge of use of DME or AE;Decreased knowledge of precautions;Pain      OT Treatment/Interventions: Self-care/ADL training;Therapeutic exercise;Energy conservation;Splinting;Therapeutic activities;Patient/family education;Balance training    OT Goals(Current goals can be found in the care plan section) Acute Rehab OT Goals Patient Stated Goal: to heal and not come back to hospital OT Goal Formulation: With patient Time For Goal Achievement: 01/24/18 Potential to Achieve Goals: Good ADL Goals Pt Will Perform Grooming: with modified independence;standing Pt Will Perform Lower Body Dressing: with modified independence;sit to/from stand Pt Will Transfer to Toilet: with modified independence;ambulating;bedside commode(BSC over toilet) Pt Will Perform Toileting - Clothing Manipulation and  hygiene: with modified independence;sit to/from stand Pt Will Perform Tub/Shower Transfer: with modified independence;Shower transfer;shower seat;rolling walker  OT Frequency: Min 2X/week   Barriers to D/C:            Co-evaluation              AM-PAC PT "6 Clicks" Daily Activity     Outcome Measure Help from another person eating meals?: None Help from another person taking care of personal grooming?: A Little Help from another person toileting, which includes using toliet, bedpan, or urinal?: A Little Help from another person bathing (including washing, rinsing, drying)?: A Little Help from another person to put on and taking off regular upper body clothing?: A Little Help from another person to put on and taking off regular lower body clothing?: A Little 6 Click Score: 19   End of Session Equipment Utilized During Treatment: Rolling walker  Activity Tolerance: Patient tolerated treatment well Patient left: in bed;with call bell/phone within reach  OT Visit Diagnosis: Unsteadiness on feet (R26.81);Muscle weakness (generalized) (M62.81);Pain Pain - part of body: (groin)                Time: 1610-9604 OT Time Calculation (min): 30 min Charges:  OT General Charges $OT Visit: 1 Visit OT Evaluation $OT Eval Moderate Complexity: 1 Mod OT Treatments $Self Care/Home Management : 8-22 mins G-Codes:     [name], MS OTR/L  Pager: [phone]   Kassie Keng A Malky Rudzinski 01/10/2018, 1:28 PM

## 2018-01-10 NOTE — Progress Notes (Addendum)
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  He has COPD, GERD, HTN, anxiety.  He has completed surgical treatments for his Fournier's gangrene .Readmitted for plastic surgery to groin and testicles. Pt s/p excision of groin wound with placement of A-cell and wound closure on 01/08/18.  post op instructions to limit spreading of legs.     OT comments  Pt seen this afternoon at request of RN case manager due to concerns over pt discharging home alone. During initial evaluation, pt planning to D/C home with daughter able to provide 24 hour assistance. However, received new information that pt will need to be home alone during the first few days if discharging home. Feel that pt will need 24 hour assistance to manage BLE ADL adhering to precautions, for safety with toilet transfers, and for safety with wound care. Thus, updated D/C recommendation to SNF level rehabilitation to maximize independence and safety with ADL prior to D/C home. 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 Feel that short-term rehabilitation would best facilitate this.     Follow Up Recommendations  Supervision/Assistance - 24 hour;SNF    Equipment Recommendations  3 in 1 bedside commode    Recommendations for Other Services      Precautions / Restrictions Precautions Precautions: Fall Precaution Comments: L JP drain; do not spread legs Restrictions Weight Bearing Restrictions: No       Mobility Bed Mobility Overal bed mobility: Needs Assistance Bed Mobility: Rolling;Sidelying to Sit     Supine to sit: Supervision Sit to supine: Supervision   General bed mobility comments: Supervision for safety with cues to maintain BLE as close together as possible.   Transfers Overall transfer  level: Needs assistance Equipment used: Rolling walker (2 wheeled) Transfers: Sit to/from Stand Sit to Stand: Min guard Stand pivot transfers: Supervision       General transfer comment: Min guard assist for safety.     Balance Overall balance assessment: Needs assistance Sitting-balance support: Feet supported;Bilateral upper extremity supported Sitting balance-Leahy Scale: Fair Sitting balance - Comments: Pt having to unweight his private area with bil hands seated EOB.    Standing balance support: Bilateral upper extremity supported;No upper extremity supported;Single extremity supported Standing balance-Leahy Scale: Fair Standing balance comment: static standing with close supervision, anything dynamic requires assist.                            ADL either performed or assessed with clinical judgement   ADL Overall ADL's : Needs assistance/impaired     Grooming: Standing;Supervision/safety;Oral care                 Lower Body Dressing Details (indicate cue type and reason): difficulty completing LB ADL keeping BLE together; will need education with AE Toilet Transfer: Min guard;Ambulation;RW;Supervision/safety Toilet Transfer Details (indicate cue type and reason): Min guard as pt fatigued.          Functional mobility during ADLs: Min guard;Supervision/safety;Rolling walker General ADL Comments: Pt continues to demonstrate decreased activity tolerance for ADL participation.      Vision   Vision Assessment?: No apparent visual deficits   Perception     Praxis      Cognition Arousal/Alertness: Awake/alert Behavior During Therapy: WFL for tasks assessed/performed Overall Cognitive  Status: Within Functional Limits for tasks assessed                                 General Comments: Able to converse well with therapist        Exercises     Shoulder Instructions       General Comments      Pertinent Vitals/ Pain        Pain Assessment: Faces Faces Pain Scale: Hurts little more Pain Location: scrotum, buttocks, inner thighs Pain Descriptors / Indicators: Burning;Discomfort Pain Intervention(s): Limited activity within patient's tolerance;Monitored during session;Repositioned  Home Living                                          Prior Functioning/Environment              Frequency  Min 2X/week        Progress Toward Goals  OT Goals(current goals can now be found in the care plan section)  Progress towards OT goals: Progressing toward goals  Acute Rehab OT Goals Patient Stated Goal: to heal and not come back to hospital OT Goal Formulation: With patient Time For Goal Achievement: 01/24/18 Potential to Achieve Goals: Good ADL Goals Pt Will Perform Grooming: with modified independence;standing Pt Will Perform Lower Body Dressing: with modified independence;sit to/from stand Pt Will Transfer to Toilet: with modified independence;ambulating;bedside commode(BSC over toilet) Pt Will Perform Toileting - Clothing Manipulation and hygiene: with modified independence;sit to/from stand Pt Will Perform Tub/Shower Transfer: with modified independence;Shower transfer;shower seat;rolling walker  Plan Discharge plan needs to be updated    Co-evaluation                 AM-PAC PT "6 Clicks" Daily Activity     Outcome Measure   Help from another person eating meals?: None Help from another person taking care of personal grooming?: A Little Help from another person toileting, which includes using toliet, bedpan, or urinal?: A Little Help from another person bathing (including washing, rinsing, drying)?: A Little Help from another person to put on and taking off regular upper body clothing?: A Little Help from another person to put on and taking off regular lower body clothing?: A Little 6 Click Score: 19    End of Session Equipment Utilized During Treatment: Rolling  walker  OT Visit Diagnosis: Unsteadiness on feet (R26.81);Muscle weakness (generalized) (M62.81);Pain Pain - part of body: (groin)   Activity Tolerance Patient tolerated treatment well   Patient Left in bed;with call bell/phone within reach   Nurse Communication          Time: 4098-1191 OT Time Calculation (min): 12 min  Charges: OT General Charges $OT Visit: 1 Visit OT Treatments $Self Care/Home Management : 8-22 mins  [name], MS OTR/L  Pager: [phone]    Sai Zinn A Clyde Zarrella 01/10/2018, 4:39 PM

## 2018-01-11 LAB — URINE CULTURE: Culture: NO GROWTH

## 2018-01-11 NOTE — NC FL2 (Signed)
[place] MEDICAID FL2 LEVEL OF CARE SCREENING TOOL     IDENTIFICATION  Patient Name: Patrick Aguilar Birthdate: 10/24/1950 Sex: male Admission Date (Current Location): 01/08/2018  [facility] and [place] Number:  [employer] and Address:  [facility], [address], [place], [place] [address]      Provider Number: [id number]  Attending Physician Name and Address:  Peggye Form, DO  Relative Name and Phone Number:  Juliette Alcide, daughter; [phone]    Current Level of Care: Hospital Recommended Level of Care: Skilled Nursing Facility Prior Approval Number:    Date Approved/Denied:   PASRR Number: [id number]  Discharge Plan: SNF    Current Diagnoses: Patient Active Problem List   Diagnosis Date Noted  . Non-healing left groin open wound, initial encounter 01/08/2018  . HTN (hypertension) 12/21/2017  . COPD (chronic obstructive pulmonary disease) (HCC) 12/21/2017  . Fournier gangrene   . Sialolithiasis of submandibular gland 03/12/2015    Orientation RESPIRATION BLADDER Height & Weight     Self, Situation, Place, Time  Normal Continent, External catheter Weight: 187 lb 2.7 oz (84.9 kg) Height:   (172.7 cm)  BEHAVIORAL SYMPTOMS/MOOD NEUROLOGICAL BOWEL NUTRITION STATUS      Continent Diet(see discharge summary)  AMBULATORY STATUS COMMUNICATION OF NEEDS Skin   Limited Assist Verbally Surgical wounds, Other (Comment)(MASD on thigh and buttocks; incision on groin (scrotal area) with gauze and ABD; old incision on perineum)                       Personal Care Assistance Level of Assistance  Bathing, Feeding, Dressing Bathing Assistance: Limited assistance Feeding assistance: Independent Dressing Assistance: Limited assistance     Functional Limitations Info  Sight, Hearing, Speech Sight Info: Adequate Hearing Info: Adequate Speech Info: Adequate    SPECIAL CARE FACTORS FREQUENCY  PT (By licensed PT), OT (By  licensed OT)     PT Frequency: 2x week OT Frequency: 2x week            Contractures Contractures Info: Not present    Additional Factors Info  Code Status, Allergies, Insulin Sliding Scale Code Status Info: Full Code Allergies Info: Morphine and Related   Insulin Sliding Scale Info: 0-15 units 3x day with meals, 0-5 units daily at bedtime       Current Medications (01/11/2018):  This is the current hospital active medication list Current Facility-Administered Medications  Medication Dose Route Frequency Provider Last Rate Last Dose  . 0.45 % NaCl with KCl 20 mEq / L infusion   Intravenous Continuous Rayburn, Fanny Bien, PA-C 10 mL/hr at 01/10/18 8469    . acetaminophen (TYLENOL) tablet 325 mg  325 mg Oral Q6H PRN Dillingham, Alena Bills, DO      . ceFAZolin (ANCEF) IVPB 2g/100 mL premix  2 g Intravenous Q8H Dillingham, Alena Bills, DO   Stopped at 01/11/18 0531  . diphenhydrAMINE (BENADRYL) 12.5 MG/5ML elixir 12.5 mg  12.5 mg Oral Q6H PRN Dillingham, Alena Bills, DO       Or  . diphenhydrAMINE (BENADRYL) injection 12.5 mg  12.5 mg Intravenous Q6H PRN Dillingham, Alena Bills, DO      . gabapentin (NEURONTIN) capsule 800 mg  800 mg Oral QID Hammons, Kimberly B, RPH   800 mg at 01/11/18 1032  . HYDROcodone-acetaminophen (NORCO/VICODIN) 5-325 MG per tablet 1-2 tablet  1-2 tablet Oral Q4H PRN Dillingham, Alena Bills, DO   2 tablet at 01/11/18 1107  . naproxen (  NAPROSYN) tablet 500 mg  500 mg Oral BID PRN Dillingham, Claire S, DO      . ondansetron (ZOFRAN-ODT) disintegrating tablet 4 mg  4 mg Oral Q6H PRN Dillingham, Alena Bills, DO       Or  . ondansetron (ZOFRAN) injection 4 mg  4 mg Intravenous Q6H PRN Dillingham, Alena Bills, DO      . polyethylene glycol (MIRALAX / GLYCOLAX) packet 17 g  17 g Oral Daily PRN Dillingham, Alena Bills, DO      . senna (SENOKOT) tablet 8.6 mg  1 tablet Oral BID Dillingham, Claire S, DO   8.6 mg at 01/11/18 1031     Discharge Medications: Please see discharge  summary for a list of discharge medications.  Relevant Imaging Results:  Relevant Lab Results:   Additional Information SS# [id number] [place], [place]

## 2018-01-11 NOTE — Plan of Care (Signed)
  Problem: Clinical Measurements: Goal: Will remain free from infection Outcome: Progressing   Problem: Activity: Goal: Risk for activity intolerance will decrease Outcome: Progressing   Problem: Pain Managment: Goal: General experience of comfort will improve Outcome: Progressing   

## 2018-01-11 NOTE — Progress Notes (Signed)
3 Days Post-Op   Subjective/Chief Complaint: Reports some bloody drainage on toilet paper with BM. Feels it is coming from the incision. JP drain functioning well and moderate serosanguinous drainage.   Incisions over scrotum/perineum intact and scant serous appearing drainage on pad. Testes remain in place in medial thighs.    Objective: Vital signs in last 24 hours: Temp:  [97.9 F (36.6 C)-98.5 F (36.9 C)] 98.5 F (36.9 C) (05/02 1402) Pulse Rate:  [88-110] 110 (05/02 1402) Resp:  [18] 18 (05/02 1402) BP: (151-158)/(90-100) 151/96 (05/02 1402) SpO2:  [94 %-96 %] 94 % (05/02 1402) Last BM Date: 01/10/18  Intake/Output from previous day: 05/01 0701 - 05/02 0700 In: 555 [P.O.:455; IV Piggyback:100] Out: 2345 [Urine:2300; Drains:45] Intake/Output this shift: Total I/O In: 120 [P.O.:120] Out: 1100 [Urine:1100]    Lab Results:  No results for input(s): WBC, HGB, HCT, PLT in the last 72 hours. BMET No results for input(s): NA, K, CL, CO2, GLUCOSE, BUN, CREATININE, CALCIUM in the last 72 hours. PT/INR No results for input(s): LABPROT, INR in the last 72 hours. ABG No results for input(s): PHART, HCO3 in the last 72 hours.  Invalid input(s): PCO2, PO2  Studies/Results: No results found.  Anti-infectives: Anti-infectives (From admission, onward)   Start     Dose/Rate Route Frequency Ordered Stop   01/08/18 1400  ceFAZolin (ANCEF) IVPB 2g/100 mL premix     2 g 200 mL/hr over 30 Minutes Intravenous Every 8 hours 01/08/18 1105     01/08/18 0805  polymyxin B 500,000 Units, bacitracin 50,000 Units in sodium chloride 0.9 % 500 mL irrigation  Status:  Discontinued       As needed 01/08/18 0806 01/08/18 0907   01/08/18 0546  ceFAZolin (ANCEF) IVPB 2g/100 mL premix     2 g 200 mL/hr over 30 Minutes Intravenous On call to O.R. 01/08/18 0546 01/08/18 0740      Assessment/Plan: s/p Procedure(s): EXCISION OF GROIN WOUND WITH PLACEMENT OF ACELL, AND PRIMARY WOUND CLOSURE  (N/A) APPLICATION OF A-CELL OF GROIN (N/A) Continue IV Ancef while here.   Plan SNF placement with limited mobility at this point.   LOS: 2 days   Patrick Daus,PA-C Plastic Surgery [phone]

## 2018-01-11 NOTE — Social Work (Addendum)
2:08pm- CSW has received confirmation that [facility] has started [employer] authorization.   12:59pm- CSW has placed a call to [facility] regarding referral, await updates to begin insurance authorization.   Doy Hutching, LCSWA [facility] Clinical Social Work [phone]

## 2018-01-11 NOTE — Clinical Social Work Note (Signed)
Clinical Social Work Assessment  Patient Details  Name: AMEDEE CERRONE MRN: [id number] Date of Birth: 1950/12/30  Date of referral:  01/11/18               Reason for consult:  Facility Placement, Discharge Planning                Permission sought to share information with:  Facility Medical sales representative, Family Supports Permission granted to share information::  Yes, Verbal Permission Granted  Name::     Juliette Alcide  Agency::     Relationship::  daughter  Contact Information:  [phone]  Housing/Transportation Living arrangements for the past 2 months:  Single Family Home Source of Information:  Case Manager, Adult Children Patient Interpreter Needed:  None Criminal Activity/Legal Involvement Pertinent to Current Situation/Hospitalization:  No - Comment as needed Significant Relationships:  Adult Children, Community Support, Other Family Members Lives with:   Adult children Do you feel safe going back to the place where you live?  Yes Need for family participation in patient care:  Yes (Comment)  Care giving concerns:  Pt daughter feels that pt will need more therapies/wound care than she can provide in the home with home health.    Social Worker assessment / plan:  Per request of pt, CSW spoke with pt daughter Juliette Alcide. Pt prior to scrotal surgeries had been living at home alone, since his first surgery in early April he has been staying with his adult daughter and her family. Pt daughter states she is okay with helping pt out, but feels like his wound care, drains, and therapy needs are more than she can support with at home.   CSW explained SNF process and need for insurance authorization. Pt daughter states that their preference is for [facility] and understands the process. Pt daughter states no further questions would like to be updated, as she had reached out to [facility] and was informed they had bed availability.   Employment status:  Disabled (Comment on whether  or not currently receiving Disability) Insurance information:  Teacher, English as a foreign language PT Recommendations:  Skilled Nursing Facility, 24 Hour Supervision Information / Referral to community resources:     Patient/Family's Response to care: Pt daughter pleasant via telephone, states understanding of CSW role and was grateful for CSW assistance with placement.   Patient/Family's Understanding of and Emotional Response to Diagnosis, Current Treatment, and Prognosis: Pt daughter states understanding of diagnosis, current treatment and prognosis. Pt daughter is clear with her expectations of what she is willing to provide support wise for pt. Pt daughter states reasonable expectations for SNF stay and potential return of pt to her home before returning back to his home.   Emotional Assessment Appearance:  Appears stated age Attitude/Demeanor/Rapport:   Pleasant, Gracious Affect (typically observed):   Accepting, appropriate Orientation:  Oriented to Self, Oriented to Place, Oriented to  Time, Oriented to Situation Alcohol / Substance use:  (former smoker; former ETOH consumption has quit both) Psych involvement (Current and /or in the community):  No (Comment)  Discharge Needs  Concerns to be addressed:  Care Coordination, Discharge Planning Concerns Readmission within the last 30 days:  Yes Current discharge risk:  Physical Impairment Barriers to Discharge:  Continued Medical Work up, [employer], LCSWA 01/11/2018, 11:35 AM

## 2018-01-12 MED ORDER — DIPHENHYDRAMINE HCL 12.5 MG/5ML PO ELIX: 12.5000 mg | ORAL_SOLUTION | Freq: Four times a day (QID) | ORAL | 0 refills | Status: DC | PRN

## 2018-01-12 MED ORDER — HYDROCODONE-ACETAMINOPHEN 5-325 MG PO TABS: 1.0000 | ORAL_TABLET | ORAL | 0 refills | Status: DC | PRN

## 2018-01-12 MED ORDER — POLYETHYLENE GLYCOL 3350 17 G PO PACK: 17.0000 g | PACK | Freq: Every day | ORAL | 0 refills | Status: DC | PRN

## 2018-01-12 MED ORDER — ACETAMINOPHEN 325 MG PO TABS: 325.0000 mg | ORAL_TABLET | Freq: Four times a day (QID) | ORAL | Status: DC | PRN

## 2018-01-12 MED ORDER — SENNA 8.6 MG PO TABS: 1.0000 | ORAL_TABLET | Freq: Two times a day (BID) | ORAL | 0 refills | Status: DC

## 2018-01-12 NOTE — Discharge Summary (Addendum)
Physician Discharge Summary  Patient ID: Patrick Aguilar MRN: [id number] DOB/AGE: 03/16/1951 67 y.o.  Admit date: 01/08/2018 Discharge date: 01/15/2018 Admission Diagnoses:Open wound of groin with exposed testicle  Discharge Diagnoses:  Active Problems:   Non-healing left groin open wound, initial encounter   Discharged Condition: good  Hospital Course: Patrick Aguilar is a 67 you male who was readmitted for treatment of open wound of the groin with exposure of testes. He had been admitted 12/13/17 to [facility] with sepsis/shock from Fournier's gangrene and required total scrotectomy 12/14/17 by urology. He had ARDS and AKI during this admit but improved and was taken back to the OR 12/25/17 for bilateral thigh flaps to cover the testicles and closure of the skin but developed dehiscence of the area the week following closure.  He was admitted and returned to the OR 01/08/18 for advancement of bilateral thigh flaps for coverage of the testes and excision of the skin and soft tissue of the groin and placement of Acell and primary closure of the residual wound. The area has remained closed. The patient is mobilizing with therapies but needs assistance/24 hour a day supervision at this time. The plan is for skilled nursing placement.   He continues to have a JP drain in place and this should be emptied several times daily and volumes recorded for the office visit next week. He should not separate his legs as much as possible to try to avoid re-opening of the groin incision.   Consults: Physical therapy and Occupational therapy   Treatments: surgery:  PROCEDURE:  1. Advancement of bilateral thigh flap 5 x 5 cm.  2. Excisional of skin and soft tissue 5 x 10 cm groin wound 3. Placement of Acell (1 gm powder and 7 x 10 cm sheet) 4. Primary closure of 10 wound  SURGEON: Claire Sanger Dillingham, DO    Discharge Exam: Blood pressure (!) 146/92, pulse 69, temperature 97.7 F (36.5 C),  temperature source Oral, resp. rate 20, height '5\' 8"'  (1.727 m), weight 84.9 kg (187 lb 2.7 oz), SpO2 98 %. General appearance: alert, cooperative, appears stated age and no distress Resp: clear to auscultation bilaterally Cardio: regular rate and rhythm The groin wound remains intact, with some scant serosanguinous drainage from the incisional area. Mild edema of the perineum. JP drainage is serosanguinous and moderate.   Disposition: Discharge disposition: 03-Skilled Nursing Facility       Discharge Instructions    Call MD for:  redness, tenderness, or signs of infection (pain, swelling, redness, odor or green/yellow discharge around incision site)   Complete by:  As directed    Call MD for:  severe uncontrolled pain   Complete by:  As directed    Call MD for:  temperature >100.4   Complete by:  As directed    Change dressing (specify)   Complete by:  As directed    Dressing change: 1 times per day using dry ABD pad or similar pad.   Diet - low sodium heart healthy   Complete by:  As directed    Discharge instructions   Complete by:  As directed    Continue JP drain and empty JP drain several times daily and recharge the bulb each time.  Please record the amount of the drainage each time and send record of drainage to the office follow up appointment.   Dry pad to scrotal/perineum at least daily and change prn for soiling or drainage.  May shower and get the incisional  area wet and use soap to the area, but no scrubbing over the incisional area.   Increase activity slowly   Complete by:  As directed    Try not to separate legs when moving especially getting into and out of bed/chairs.   Lifting restrictions   Complete by:  As directed    No lifting more than 5 lbs     Allergies as of 01/14/2018      Reactions   Morphine And Related Itching      Medication List    STOP taking these medications   collagenase ointment Commonly known as:  SANTYL   oxyCODONE-acetaminophen  10-325 MG tablet Commonly known as:  PERCOCET     TAKE these medications   acetaminophen 325 MG tablet Commonly known as:  TYLENOL Take 1 tablet (325 mg total) by mouth every 6 (six) hours as needed for mild pain.   albuterol (2.5 MG/3ML) 0.083% nebulizer solution Commonly known as:  PROVENTIL Take 2.5 mg by nebulization every 6 (six) hours as needed for wheezing or shortness of breath.   atenolol-chlorthalidone 50-25 MG tablet Commonly known as:  TENORETIC Take 1 tablet by mouth daily as needed.   blood glucose meter kit and supplies Kit Dispense based on patient and insurance preference. Use up to four times daily as directed. (FOR ICD-9 250.00, 250.01).   diclofenac 75 MG EC tablet Commonly known as:  VOLTAREN Take 75 mg by mouth 2 (two) times daily.   diphenhydrAMINE 12.5 MG/5ML elixir Commonly known as:  BENADRYL Take 5 mLs (12.5 mg total) by mouth every 6 (six) hours as needed for itching.   doxazosin 8 MG tablet Commonly known as:  CARDURA Take 8 mg by mouth daily as needed.   gabapentin 800 MG tablet Commonly known as:  NEURONTIN Take 800 mg by mouth 4 (four) times daily.   HYDROcodone-acetaminophen 5-325 MG tablet Commonly known as:  NORCO/VICODIN Take 1 tablet by mouth every 4 (four) hours as needed for moderate pain.   insulin aspart 100 UNIT/ML injection Commonly known as:  novoLOG Inject 0-15 Units into the skin 3 (three) times daily with meals.   metFORMIN 500 MG tablet Commonly known as:  GLUCOPHAGE Take 1 tablet (500 mg total) by mouth 2 (two) times daily with a meal.   multivitamin with minerals Tabs tablet Take 1 tablet by mouth daily.   polyethylene glycol packet Commonly known as:  MIRALAX / GLYCOLAX Take 17 g by mouth daily as needed for mild constipation.   potassium chloride SA 20 MEQ tablet Commonly known as:  K-DUR,KLOR-CON Take 40 mEq by mouth daily.   ranitidine 150 MG tablet Commonly known as:  ZANTAC Take 150 mg by mouth 2  (two) times daily as needed.   senna 8.6 MG Tabs tablet Commonly known as:  SENOKOT Take 1 tablet (8.6 mg total) by mouth 2 (two) times daily.   traZODone 100 MG tablet Commonly known as:  DESYREL Take 100 mg by mouth at bedtime as needed for sleep.            Discharge Care Instructions  (From admission, onward)        Start     Ordered   01/12/18 0000  Change dressing (specify)    Comments:  Dressing change: 1 times per day using dry ABD pad or similar pad.   01/12/18 1411      Contact information for follow-up providers    Dillingham, Loel Lofty, DO Follow up on 01/18/2018.  Specialty:  Plastic Surgery Why:  Appointment scheduled for next Thursday for 1:30 pm.  Contact information: [place] [address] [phone]            Contact information for after-discharge care    Destination    [facility] .   Service:  Skilled Nursing Contact information: [address] [place] [phone]                  Signed: Ulysees Barns Plastic Surgery [phone]

## 2018-01-12 NOTE — Progress Notes (Signed)
4 Days Post-Op   Subjective/Chief Complaint: Reports some drainage on pads when he used the bathroom today.  The scrotal/perineum incision is intact, some minimal serosanguinous drainage about the base of the penis inferiorly. JP drainage is moderate and serosanguinous. Less edema over the area. Testes remain in the medial thighs.    Objective: Vital signs in last 24 hours: Temp:  [98.4 F (36.9 C)-99 F (37.2 C)] 98.5 F (36.9 C) (05/03 1341) Pulse Rate:  [75-110] 87 (05/03 1341) Resp:  [16-18] 18 (05/03 0624) BP: (143-154)/(84-102) 153/93 (05/03 1341) SpO2:  [93 %-97 %] 97 % (05/03 1341) Last BM Date: 01/10/18  Intake/Output from previous day: 05/02 0701 - 05/03 0700 In: 120 [P.O.:120] Out: 2610 [Urine:2600; Drains:10] Intake/Output this shift: No intake/output data recorded.    Lab Results:  No results for input(s): WBC, HGB, HCT, PLT in the last 72 hours. BMET No results for input(s): NA, K, CL, CO2, GLUCOSE, BUN, CREATININE, CALCIUM in the last 72 hours. PT/INR No results for input(s): LABPROT, INR in the last 72 hours. ABG No results for input(s): PHART, HCO3 in the last 72 hours.  Invalid input(s): PCO2, PO2  Studies/Results: No results found.  Anti-infectives: Anti-infectives (From admission, onward)   Start     Dose/Rate Route Frequency Ordered Stop   01/08/18 1400  ceFAZolin (ANCEF) IVPB 2g/100 mL premix     2 g 200 mL/hr over 30 Minutes Intravenous Every 8 hours 01/08/18 1105     01/08/18 0805  polymyxin B 500,000 Units, bacitracin 50,000 Units in sodium chloride 0.9 % 500 mL irrigation  Status:  Discontinued       As needed 01/08/18 0806 01/08/18 0907   01/08/18 0546  ceFAZolin (ANCEF) IVPB 2g/100 mL premix     2 g 200 mL/hr over 30 Minutes Intravenous On call to O.R. 01/08/18 0546 01/08/18 0740      Assessment/Plan: s/p Procedure(s): EXCISION OF GROIN WOUND WITH PLACEMENT OF ACELL, AND PRIMARY WOUND CLOSURE (N/A) APPLICATION OF A-CELL OF GROIN  (N/A) Okay for discharge to SNF when bed available.  Will need to continue to avoid separating legs when mobilizing.  Will not need to continue antibiotics at SNF.  Will need to follow up in office next Thursday at 1:30 pm.   LOS: 3 days    RAYBURN,SHAWN,PA-C Plastic Surgery [phone]

## 2018-01-12 NOTE — Care Management Important Message (Signed)
Important Message  Patient Details  Name: Patrick Aguilar MRN: [id number] Date of Birth: [birth year]   Medicare Important Message Given:  Yes    [name] 01/12/2018, 4:26 PM

## 2018-01-12 NOTE — Social Work (Addendum)
5:49pm- Pt still has not received [place] authorization, [place], [place] has been informed as has pt daughter.   11:24am- CSW awaiting confirmation from [facility] of determination from [facility].  CSW spoke with pt daughter, she and her family are heading out of town and would like [facility] transport if pt discharges after they are able to do transport.  Await [employer] determination and continue to follow. Doy Hutching, LCSWA [facility] Clinical Social Work [phone]

## 2018-01-13 LAB — GLUCOSE, CAPILLARY
GLUCOSE-CAPILLARY: 184 mg/dL — AB (ref 65–99)
GLUCOSE-CAPILLARY: 142 mg/dL — AB (ref 65–99)
GLUCOSE-CAPILLARY: 202 mg/dL — AB (ref 65–99)

## 2018-01-13 MED ORDER — HYDROCODONE-ACETAMINOPHEN 5-325 MG PO TABS: 1.0000 | ORAL_TABLET | ORAL | 0 refills | Status: DC | PRN

## 2018-01-13 MED ORDER — METFORMIN HCL 500 MG PO TABS: 500.0000 mg | ORAL_TABLET | Freq: Two times a day (BID) | ORAL | Status: DC

## 2018-01-13 MED ORDER — FAMOTIDINE 20 MG PO TABS
20.0000 mg | ORAL_TABLET | Freq: Two times a day (BID) | ORAL | Status: DC
  Administered 2018-01-13 – 2018-01-15 (×5): 20 mg via ORAL
  Filled 2018-01-13 (×5): qty 1

## 2018-01-13 MED ORDER — TRAZODONE HCL 100 MG PO TABS
100.0000 mg | ORAL_TABLET | Freq: Every evening | ORAL | Status: DC | PRN
  Administered 2018-01-15: 100 mg via ORAL
  Filled 2018-01-13: qty 1

## 2018-01-13 MED ORDER — ALBUTEROL SULFATE (2.5 MG/3ML) 0.083% IN NEBU: 2.5000 mg | INHALATION_SOLUTION | Freq: Four times a day (QID) | RESPIRATORY_TRACT | Status: DC | PRN

## 2018-01-13 MED ORDER — INSULIN ASPART 100 UNIT/ML ~~LOC~~ SOLN
0.0000 [IU] | Freq: Three times a day (TID) | SUBCUTANEOUS | Status: DC
  Administered 2018-01-13: 2 [IU] via SUBCUTANEOUS
  Administered 2018-01-13 – 2018-01-14 (×2): 3 [IU] via SUBCUTANEOUS
  Administered 2018-01-14 – 2018-01-15 (×4): 2 [IU] via SUBCUTANEOUS

## 2018-01-13 MED ORDER — METFORMIN HCL 500 MG PO TABS
500.0000 mg | ORAL_TABLET | Freq: Two times a day (BID) | ORAL | Status: DC
  Administered 2018-01-13 – 2018-01-15 (×4): 500 mg via ORAL
  Filled 2018-01-13 (×4): qty 1

## 2018-01-13 MED ORDER — INSULIN ASPART 100 UNIT/ML ~~LOC~~ SOLN: 0.0000 [IU] | Freq: Three times a day (TID) | SUBCUTANEOUS | 11 refills | Status: DC

## 2018-01-13 MED ORDER — ATENOLOL 50 MG PO TABS
50.0000 mg | ORAL_TABLET | Freq: Every day | ORAL | Status: DC
  Administered 2018-01-13 – 2018-01-15 (×3): 50 mg via ORAL
  Filled 2018-01-13 (×3): qty 1

## 2018-01-13 MED ORDER — ATENOLOL-CHLORTHALIDONE 50-25 MG PO TABS: 1.0000 | ORAL_TABLET | Freq: Every day | ORAL | Status: DC

## 2018-01-13 MED ORDER — CHLORTHALIDONE 25 MG PO TABS
25.0000 mg | ORAL_TABLET | Freq: Every day | ORAL | Status: DC
  Administered 2018-01-13 – 2018-01-15 (×3): 25 mg via ORAL
  Filled 2018-01-13 (×3): qty 1

## 2018-01-13 MED ORDER — POTASSIUM CHLORIDE CRYS ER 20 MEQ PO TBCR
40.0000 meq | EXTENDED_RELEASE_TABLET | Freq: Every day | ORAL | Status: DC
  Administered 2018-01-13 – 2018-01-15 (×3): 40 meq via ORAL
  Filled 2018-01-13 (×3): qty 2

## 2018-01-13 MED ORDER — DOXAZOSIN MESYLATE 8 MG PO TABS
8.0000 mg | ORAL_TABLET | Freq: Every day | ORAL | Status: DC
  Administered 2018-01-13 – 2018-01-15 (×3): 8 mg via ORAL
  Filled 2018-01-13 (×4): qty 1

## 2018-01-13 MED ORDER — ADULT MULTIVITAMIN W/MINERALS CH
1.0000 | ORAL_TABLET | Freq: Every day | ORAL | Status: DC
  Administered 2018-01-13 – 2018-01-15 (×3): 1 via ORAL
  Filled 2018-01-13 (×3): qty 1

## 2018-01-13 NOTE — Progress Notes (Signed)
5 Days Post-Op   Subjective/Chief Complaint:  Patient without complaints  Objective: Vital signs in last 24 hours: Temp:  [97.8 F (36.6 C)-98.5 F (36.9 C)] 97.8 F (36.6 C) (05/04 0445) Pulse Rate:  [77-87] 79 (05/04 0445) Resp:  [18] 18 (05/04 0445) BP: (148-164)/(88-97) 148/88 (05/04 0445) SpO2:  [97 %] 97 % (05/04 0445) Last BM Date: 01/12/18  Intake/Output from previous day: 05/03 0701 - 05/04 0700 In: 1631.2 [P.O.:1008; I.V.:323.2; IV Piggyback:300] Out: 2575 [Urine:2550; Drains:25] Intake/Output this shift: Total I/O In: 357 [P.O.:357] Out: 30 [Drains:30]  PE Alert NAD Drain scant serosanguinous Incision intact no real drainage   Assessment/Plan: s/p Procedure(s): EXCISION OF GROIN WOUND WITH PLACEMENT OF ACELL, AND PRIMARY WOUND CLOSURE (N/A) APPLICATION OF A-CELL OF GROIN (N/A) Okay for discharge to SNF when bed available.  Patient's medications from last inpatient hospital stay including metformin, cardura, Tenoretic have not been continued this hospital stay. Reconciled these and resumed metformin, BS checks given recent diagnosis DM.  Patient also reports benazepril as home med, this was discontinued during last hospital stay. Reviewed this with patient.  D/c Ancef, has had 5 days periop treatment. Last IP stay not discharged on antibiotics.  Drain may be ready for removal next 1-2 days  Glenna Fellows, MD [facility] Plastic & Reconstructive Surgery [phone], pin [phone]

## 2018-01-13 NOTE — Social Work (Signed)
Still no determination from [employer] yeseterday evening, [facility] is closed on weekends, determination will be known on Monday.   Will leave hand off to f/u with facility Monday regarding authorization. Shawn, PA with plastic surgery has been notified.   Doy Hutching, LCSWA [facility] Clinical Social Work [phone]

## 2018-01-14 LAB — GLUCOSE, CAPILLARY
GLUCOSE-CAPILLARY: 131 mg/dL — AB (ref 65–99)
Glucose-Capillary: 147 mg/dL — ABNORMAL HIGH (ref 65–99)
Glucose-Capillary: 153 mg/dL — ABNORMAL HIGH (ref 65–99)
Glucose-Capillary: 151 mg/dL — ABNORMAL HIGH (ref 65–99)

## 2018-01-14 NOTE — Progress Notes (Signed)
6 Days Post-Op   Subjective/Chief Complaint: Reports feeling okay. Pain under control on po medications.  Had BM today and reports no bloody drainage from the incisional area.  JP drainage slightly up today, nursing tech reports has emptied 3 times today so far for about 20 ml each time. (Drain reported as 630 ml for yesterday, doubt this is just the JP drain if accurate and discussed the the nursing staff)  Incision remains intact and less edema over the area today, scant to no serosanguinous drainage.    Objective: Vital signs in last 24 hours: Temp:  [97.7 F (36.5 C)-99.2 F (37.3 C)] 97.7 F (36.5 C) (05/05 0635) Pulse Rate:  [68-96] 69 (05/05 0635) Resp:  [17-20] 20 (05/05 0635) BP: (112-147)/(70-92) 146/92 (05/05 0635) SpO2:  [96 %-98 %] 98 % (05/05 0635) Last BM Date: 01/13/18  Intake/Output from previous day: 05/04 0701 - 05/05 0700 In: 717 [P.O.:717] Out: 630 [Drains:630] Intake/Output this shift: Total I/O In: -  Out: 2700 [Urine:2700]  General appearance: alert, cooperative and no distress scrotal/perineum incision- intact, scant serosanguinous drainage  Lab Results:  No results for input(s): WBC, HGB, HCT, PLT in the last 72 hours. BMET No results for input(s): NA, K, CL, CO2, GLUCOSE, BUN, CREATININE, CALCIUM in the last 72 hours. PT/INR No results for input(s): LABPROT, INR in the last 72 hours. ABG No results for input(s): PHART, HCO3 in the last 72 hours.  Invalid input(s): PCO2, PO2  Studies/Results: No results found.  Anti-infectives: Anti-infectives (From admission, onward)   Start     Dose/Rate Route Frequency Ordered Stop   01/08/18 1400  ceFAZolin (ANCEF) IVPB 2g/100 mL premix  Status:  Discontinued     2 g 200 mL/hr over 30 Minutes Intravenous Every 8 hours 01/08/18 1105 01/13/18 0959   01/08/18 0805  polymyxin B 500,000 Units, bacitracin 50,000 Units in sodium chloride 0.9 % 500 mL irrigation  Status:  Discontinued       As needed  01/08/18 0806 01/08/18 0907   01/08/18 0546  ceFAZolin (ANCEF) IVPB 2g/100 mL premix     2 g 200 mL/hr over 30 Minutes Intravenous On call to O.R. 01/08/18 0546 01/08/18 0740      Assessment/Plan: s/p Procedure(s): EXCISION OF GROIN WOUND WITH PLACEMENT OF ACELL, AND PRIMARY WOUND CLOSURE (N/A) APPLICATION OF A-CELL OF GROIN (N/A) Stable for DC to SNF when bed available.  Will update DC summary.  Continue JP drain for now given the increase in drainage. DM- CBG 140-200- Monitor back on Metformin.  HTN- BP better back on medications.  Will plan to see in am tomorrow.   LOS: 5 days    Kathleene Bergemann,PA-C Plastic Surgery [phone]

## 2018-01-15 LAB — GLUCOSE, CAPILLARY
Glucose-Capillary: 129 mg/dL — ABNORMAL HIGH (ref 65–99)
Glucose-Capillary: 142 mg/dL — ABNORMAL HIGH (ref 65–99)

## 2018-01-15 NOTE — Progress Notes (Signed)
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  He has COPD, GERD, HTN, anxiety.  He has completed surgical treatments for his Fournier's gangrene .Readmitted for plastic surgery to groin and testicles. Pt s/p excision of groin wound with placement of A-cell and wound closure on 01/08/18.  post op instructions to limit spreading of legs.      PT Comments    Pt lying in supine sleeping on arrival.  Pt easy to wake and agreeable to PT session this am.  Pt remains to present with safety deficits and unable to correct without VCs.  Pt remains to benefit from skilled placement short term to improve strength and function before returning home to private residence.    Follow Up Recommendations  SNF     Equipment Recommendations  None recommended by PT    Recommendations for Other Services OT consult     Precautions / Restrictions Precautions Precautions: Fall Precaution Comments: L JP drain; do not spread legs Restrictions Weight Bearing Restrictions: No    Mobility  Bed Mobility Overal bed mobility: Needs Assistance Bed Mobility: Rolling;Sidelying to Sit;Sit to Sidelying   Sidelying to sit: Supervision     Sit to sidelying: Supervision General bed mobility comments: Supervision for safety with cues to maintain BLE as close together as possible.   Cues for log rolling to maintain spinal precautions due to history of back surgery.  This allowed him to keep his legs closer together.    Transfers Overall transfer level: Needs assistance Equipment used: Rolling walker (2 wheeled) Transfers: Sit to/from Stand Sit to Stand: Min guard         General transfer comment: Cues for hand placement to and from seated surface.    Ambulation/Gait Ambulation/Gait assistance: Min guard Ambulation Distance  (Feet): 80 Feet Assistive device: Rolling walker (2 wheeled) Gait Pattern/deviations: Step-through pattern;Decreased stance time - right;Trunk flexed Gait velocity: decreased   General Gait Details: Pt with complaints of spinal and R ankle pain during gait.  Heavily reliant on UEs with tendency to push RW too far forward.  Cues to keep RW closer to frame.  Pt receptive to corrections.     Stairs             Wheelchair Mobility    Modified Rankin (Stroke Patients Only)       Balance Overall balance assessment: Needs assistance Sitting-balance support: Feet supported;Bilateral upper extremity supported Sitting balance-Leahy Scale: Fair       Standing balance-Leahy Scale: Fair Standing balance comment: Reliant on UEs for balance in standing.                              Cognition Arousal/Alertness: Awake/alert Behavior During Therapy: WFL for tasks assessed/performed Overall Cognitive Status: Within Functional Limits for tasks assessed                                 General Comments: Able to converse well with therapist      Exercises      General Comments        Pertinent Vitals/Pain Pain Assessment: 0-10 Pain Score: 9  Pain Location: back, right ankle Pain Descriptors / Indicators: Discomfort Pain Intervention(s): Monitored during session;Repositioned  Home Living                      Prior Function            PT Goals (current goals can now be found in the care plan section) Acute Rehab PT Goals Patient Stated Goal: to heal and not come back to hospital Potential to Achieve Goals: Good Progress towards PT goals: Progressing toward goals    Frequency    Min 2X/week      PT Plan Current plan remains appropriate    Co-evaluation              AM-PAC PT "6 Clicks" Daily Activity  Outcome Measure  Difficulty turning over in bed (including adjusting bedclothes, sheets and blankets)?: A  Little Difficulty moving from lying on back to sitting on the side of the bed? : A Little Difficulty sitting down on and standing up from a chair with arms (e.g., wheelchair, bedside commode, etc,.)?: Unable Help needed moving to and from a bed to chair (including a wheelchair)?: A Little Help needed walking in hospital room?: A Little Help needed climbing 3-5 steps with a railing? : A Little 6 Click Score: 16    End of Session Equipment Utilized During Treatment: Gait belt Activity Tolerance: Patient limited by pain Patient left: in bed;with bed alarm set;with call bell/phone within reach Nurse Communication: Mobility status PT Visit Diagnosis: Muscle weakness (generalized) (M62.81);Other abnormalities of gait and mobility (R26.89);Pain Pain - Right/Left: (groin) Pain - part of body: (groin)     Time: 8295-6213 PT Time Calculation (min) (ACUTE ONLY): 18 min  Charges:  $Gait Training: 8-22 mins                    G Codes:       Joycelyn Rua, PTA pager [phone]    Florestine Avers 01/15/2018, 11:06 AM

## 2018-01-15 NOTE — Progress Notes (Signed)
Report given to Clydie Braun, Charity fundraiser at [facility].

## 2018-01-15 NOTE — Progress Notes (Signed)
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  He has COPD, GERD, HTN, anxiety.  He has completed surgical treatments for his Fournier's gangrene .Readmitted for plastic surgery to groin and testicles. Pt s/p excision of groin wound with placement of A-cell and wound closure on 01/08/18.  post op instructions to limit spreading of legs.     OT comments  Pt demonstrating progress toward OT goals this session. Session focused on education concerning use of AE for LB ADL to avoid spreading legs during dressing tasks. He was able to complete with supervision today. Pt additionally requiring min guard assist for toileting transfers and supervision for safety with standing grooming tasks. Continue to recommend short-term SNF placement at D/C as pt is unable to have 24 hour assistance at home.    Follow Up Recommendations  Supervision/Assistance - 24 hour;SNF    Equipment Recommendations  3 in 1 bedside commode    Recommendations for Other Services      Precautions / Restrictions Precautions Precautions: Fall Precaution Comments: L JP drain; do not spread legs Restrictions Weight Bearing Restrictions: No       Mobility Bed Mobility Overal bed mobility: Needs Assistance Bed Mobility: Rolling;Sidelying to Sit;Sit to Sidelying   Sidelying to sit: Supervision     Sit to sidelying: Supervision General bed mobility comments: Seated at EOB on my arrival.   Transfers Overall transfer level: Needs assistance Equipment used: Rolling walker (2 wheeled) Transfers: Sit to/from Stand Sit to Stand: Min guard         General transfer comment: Min guard assist for stability and safety.     Balance Overall balance assessment: Needs assistance Sitting-balance support: Feet supported;Bilateral upper  extremity supported Sitting balance-Leahy Scale: Fair     Standing balance support: Bilateral upper extremity supported;No upper extremity supported;Single extremity supported Standing balance-Leahy Scale: Fair Standing balance comment: Reliant on UE support for dynamic standing tasks.                            ADL either performed or assessed with clinical judgement   ADL Overall ADL's : Needs assistance/impaired Eating/Feeding: Set up;Sitting   Grooming: Standing;Supervision/safety;Oral care               Lower Body Dressing: Supervision/safety;Sit to/from stand;With adaptive equipment Lower Body Dressing Details (indicate cue type and reason): Educated concerning use of AE for LB ADL to protect wound.  Toilet Transfer: Min guard;Ambulation;RW;Supervision/safety           Functional mobility during ADLs: Min guard;Supervision/safety;Rolling walker General ADL Comments: Pt educated concerning use of reacher and sock aide for LB ADL to avoid stretching on incision.      Vision   Vision Assessment?: No apparent visual deficits   Perception     Praxis      Cognition Arousal/Alertness: Awake/alert Behavior During Therapy: WFL for tasks assessed/performed Overall Cognitive Status: Within Functional Limits for tasks assessed                                 General Comments: Able to converse well with therapist        Exercises     Shoulder Instructions       General Comments  Pertinent Vitals/ Pain       Pain Assessment: Faces Pain Score: 9  Faces Pain Scale: Hurts little more Pain Location: back, right ankle Pain Descriptors / Indicators: Discomfort Pain Intervention(s): Limited activity within patient's tolerance;Monitored during session;Repositioned  Home Living                                          Prior Functioning/Environment              Frequency  Min 2X/week        Progress  Toward Goals  OT Goals(current goals can now be found in the care plan section)  Progress towards OT goals: Progressing toward goals  Acute Rehab OT Goals Patient Stated Goal: to heal and not come back to hospital OT Goal Formulation: With patient Time For Goal Achievement: 01/24/18 Potential to Achieve Goals: Good  Plan Discharge plan remains appropriate    Co-evaluation                 AM-PAC PT "6 Clicks" Daily Activity     Outcome Measure   Help from another person eating meals?: None Help from another person taking care of personal grooming?: A Little Help from another person toileting, which includes using toliet, bedpan, or urinal?: A Little Help from another person bathing (including washing, rinsing, drying)?: A Little Help from another person to put on and taking off regular upper body clothing?: A Little Help from another person to put on and taking off regular lower body clothing?: A Little 6 Click Score: 19    End of Session Equipment Utilized During Treatment: Rolling walker  OT Visit Diagnosis: Unsteadiness on feet (R26.81);Muscle weakness (generalized) (M62.81);Pain Pain - part of body: (groin)   Activity Tolerance Patient tolerated treatment well   Patient Left in bed;with call bell/phone within reach   Nurse Communication          Time: 1610-9604 OT Time Calculation (min): 20 min  Charges: OT General Charges $OT Visit: 1 Visit OT Treatments $Self Care/Home Management : 8-22 mins  [name], MS OTR/L  Pager: [phone]    [name] [name] 01/15/2018, 11:17 AM

## 2018-01-15 NOTE — Clinical Social Work Placement (Signed)
Nurse to call report to [phone], [facility] Room 098-1    CLINICAL SOCIAL WORK PLACEMENT  NOTE  Date:  01/15/2018  Patient Details  Name: Patrick Aguilar MRN: [id number] Date of Birth: 1951-04-29  Clinical Social Work is seeking post-discharge placement for this patient at the Skilled  Nursing Facility level of care (*CSW will initial, date and re-position this form in  chart as items are completed):  Yes   Patient/family provided with [facility] Clinical Social Work Department's list of facilities offering this level of care within the geographic area requested by the patient (or if unable, by the patient's family).  Yes   Patient/family informed of their freedom to choose among providers that offer the needed level of care, that participate in Medicare, Medicaid or managed care program needed by the patient, have an available bed and are willing to accept the patient.  Yes   Patient/family informed of [place]'s ownership interest in [facility] and [facility], as well as of the fact that they are under no obligation to receive care at these facilities.  PASRR submitted to EDS on       PASRR number received on 01/11/18     Existing PASRR number confirmed on       FL2 transmitted to all facilities in geographic area requested by pt/family on 01/11/18     FL2 transmitted to all facilities within larger geographic area on       Patient informed that his/her managed care company has contracts with or will negotiate with certain facilities, including the following:        Yes   Patient/family informed of bed offers received.  Patient chooses bed at [facility]     Physician recommends and patient chooses bed at      Patient to be transferred to [facility] on 01/15/18.  Patient to be transferred to facility by [facility]     Patient family notified on 01/15/18 of transfer.  Name of family member notified:  daughter, Juliette Alcide      PHYSICIAN Please prepare priority discharge summary, including medications     Additional Comment:    _______________________________________________ Baldemar Lenis, LCSW 01/15/2018, 11:09 AM

## 2018-01-15 NOTE — Progress Notes (Signed)
7 Days Post-Op   Subjective/Chief Complaint: No new issues.   Groin wound with scant serosanguinous drainage from the base of the penis incisional area.  JP drainage recorded as 30 ml yesterday, but doubt accurate as was told drain emptied for 20-30 ml yesterday morning x 3 and was emptied later during the daytime as well per patient. Will leave drain in for now.    Objective: Vital signs in last 24 hours: Temp:  [97.7 F (36.5 C)-98.4 F (36.9 C)] 97.7 F (36.5 C) (05/06 0430) Pulse Rate:  [63-72] 72 (05/06 0430) Resp:  [17-18] 18 (05/06 0430) BP: (119-138)/(73-89) 126/89 (05/06 0430) SpO2:  [94 %-98 %] 94 % (05/06 0430) Last BM Date: 01/13/18  Intake/Output from previous day: 05/05 0701 - 05/06 0700 In: 1080 [P.O.:1080] Out: 4230 [Urine:4200; Drains:30] Intake/Output this shift: No intake/output data recorded.   Lab Results:  No results for input(s): WBC, HGB, HCT, PLT in the last 72 hours. BMET No results for input(s): NA, K, CL, CO2, GLUCOSE, BUN, CREATININE, CALCIUM in the last 72 hours. PT/INR No results for input(s): LABPROT, INR in the last 72 hours. ABG No results for input(s): PHART, HCO3 in the last 72 hours.  Invalid input(s): PCO2, PO2  Studies/Results: No results found.  Anti-infectives: Anti-infectives (From admission, onward)   Start     Dose/Rate Route Frequency Ordered Stop   01/08/18 1400  ceFAZolin (ANCEF) IVPB 2g/100 mL premix  Status:  Discontinued     2 g 200 mL/hr over 30 Minutes Intravenous Every 8 hours 01/08/18 1105 01/13/18 0959   01/08/18 0805  polymyxin B 500,000 Units, bacitracin 50,000 Units in sodium chloride 0.9 % 500 mL irrigation  Status:  Discontinued       As needed 01/08/18 0806 01/08/18 0907   01/08/18 0546  ceFAZolin (ANCEF) IVPB 2g/100 mL premix     2 g 200 mL/hr over 30 Minutes Intravenous On call to O.R. 01/08/18 0546 01/08/18 0740      Assessment/Plan: s/p Procedure(s): EXCISION OF GROIN WOUND WITH PLACEMENT OF  ACELL, AND PRIMARY WOUND CLOSURE (N/A) APPLICATION OF A-CELL OF GROIN (N/A) Plan DC to SNF today   DM- CBG 120-150's HTN- BP good back on medications.  Plan to see in the office later this week for recheck of wound and drain removal.    LOS: 6 days   Kagan Mutchler,PA-C Plastic Surgery [phone]

## 2018-01-15 NOTE — Progress Notes (Signed)
Discharge to SNF, transported by daughter.

## 2018-01-24 ENCOUNTER — Encounter (HOSPITAL_COMMUNITY): Payer: Self-pay | Admitting: *Deleted

## 2018-01-24 ENCOUNTER — Other Ambulatory Visit: Payer: Self-pay

## 2018-01-24 ENCOUNTER — Ambulatory Visit: Payer: Self-pay | Admitting: Physician Assistant

## 2018-01-24 NOTE — Progress Notes (Signed)
Faxed all pre op isntructions to [facility] faxed instructions received and understood by sarah billings rn, spoke with patient daught melinda evans and she will bring patient day of procedure.

## 2018-01-24 NOTE — Progress Notes (Signed)
Preop instructions for: Patrick Aguilar    Date of Birth   [birth year]                          Date of Procedure:  01-26-18      Doctor: DR Ulice Bold Time to arrive at [facility] :530AM  Report to: Admitting  Procedure:   Do not eat or drink past midnight the night before your procedure.(To include any tube feedings-must be discontinued)   Take these morning medications only with sips of water: GABAPENTIN (NEURONTIN), RANITIDINE (ZANTAC), ALBUTEROL NEBULIZER IF NEEDED Note: No Insulin or Diabetic meds should be given or taken the morning of the procedure!   Facility contact:   Renne Crigler    Phone: [phone]      Health Care POA: PATIENT SIGNS OWN CONSENTS  Transportation contact phone#: MELINDA EVANS DAUGHTER BRINGING PATIENT  Please send day of procedure:current med list and meds last taken that day, confirm nothing by mouth status from what time, Patient Demographic info( to include DNR status, problem list, allergies)   RN contact name/phone#:  Karin Golden RN PHONE [phone] and Fax #:[phone] [facility]  Bring Insurance card and picture ID Leave all jewelry and other valuables at place where living( no metal or rings to be worn) No contact lens Women-no make-up, no lotions,perfumes,powders Men-no colognes,lotions  Any questions day of procedure,call SHORT STAY [phone]  Sent from :[facility]                   Phone:[phone]                   Fax:[phone]  Sent by :Cain Sieve RN

## 2018-01-26 ENCOUNTER — Encounter (HOSPITAL_COMMUNITY): Payer: Self-pay | Admitting: *Deleted

## 2018-01-26 ENCOUNTER — Ambulatory Visit (HOSPITAL_COMMUNITY): Payer: Medicare HMO | Admitting: Anesthesiology

## 2018-01-26 ENCOUNTER — Ambulatory Visit (HOSPITAL_COMMUNITY)
Admission: RE | Admit: 2018-01-26 | Discharge: 2018-01-26 | Disposition: A | Discharge status: Home / Self Care | Payer: Medicare HMO | Source: Ambulatory Visit | Attending: Plastic Surgery | Admitting: Plastic Surgery

## 2018-01-26 ENCOUNTER — Encounter (HOSPITAL_COMMUNITY)
Admission: RE | Disposition: A | Discharge status: Home / Self Care | Payer: Self-pay | Source: Ambulatory Visit | Attending: Plastic Surgery

## 2018-01-26 DIAGNOSIS — Z794 Long term (current) use of insulin: Secondary | ICD-10-CM | POA: Insufficient documentation

## 2018-01-26 DIAGNOSIS — Z87891 Personal history of nicotine dependence: Secondary | ICD-10-CM | POA: Diagnosis not present

## 2018-01-26 DIAGNOSIS — M1990 Unspecified osteoarthritis, unspecified site: Secondary | ICD-10-CM | POA: Insufficient documentation

## 2018-01-26 DIAGNOSIS — E119 Type 2 diabetes mellitus without complications: Secondary | ICD-10-CM | POA: Diagnosis not present

## 2018-01-26 DIAGNOSIS — F419 Anxiety disorder, unspecified: Secondary | ICD-10-CM | POA: Insufficient documentation

## 2018-01-26 DIAGNOSIS — Z885 Allergy status to narcotic agent status: Secondary | ICD-10-CM | POA: Insufficient documentation

## 2018-01-26 DIAGNOSIS — Y838 Other surgical procedures as the cause of abnormal reaction of the patient, or of later complication, without mention of misadventure at the time of the procedure: Secondary | ICD-10-CM | POA: Insufficient documentation

## 2018-01-26 DIAGNOSIS — K219 Gastro-esophageal reflux disease without esophagitis: Secondary | ICD-10-CM | POA: Diagnosis not present

## 2018-01-26 DIAGNOSIS — N493 Fournier gangrene: Secondary | ICD-10-CM | POA: Insufficient documentation

## 2018-01-26 DIAGNOSIS — I10 Essential (primary) hypertension: Secondary | ICD-10-CM | POA: Diagnosis not present

## 2018-01-26 DIAGNOSIS — T8189XA Other complications of procedures, not elsewhere classified, initial encounter: Secondary | ICD-10-CM | POA: Diagnosis present

## 2018-01-26 DIAGNOSIS — J449 Chronic obstructive pulmonary disease, unspecified: Secondary | ICD-10-CM | POA: Insufficient documentation

## 2018-01-26 HISTORY — PX: DEBRIDEMENT AND CLOSURE WOUND: SHX5614

## 2018-01-26 LAB — BASIC METABOLIC PANEL
ANION GAP: 12 (ref 5–15)
BUN: 23 mg/dL — ABNORMAL HIGH (ref 6–20)
CALCIUM: 9.2 mg/dL (ref 8.9–10.3)
CHLORIDE: 102 mmol/L (ref 101–111)
CO2: 24 mmol/L (ref 22–32)
CREATININE: 0.94 mg/dL (ref 0.61–1.24)
GFR calc Af Amer: >60 mL/min (ref >60)
GFR calc non Af Amer: >60 mL/min (ref >60)
Glucose, Bld: 139 mg/dL — ABNORMAL HIGH (ref 65–99)
Potassium: 3.9 mmol/L (ref 3.5–5.1)
SODIUM: 138 mmol/L (ref 135–145)

## 2018-01-26 LAB — CBC
HEMATOCRIT: 34.9 % — AB (ref 39.0–52.0)
HEMOGLOBIN: 11.6 g/dL — AB (ref 13.0–17.0)
MCH: 30 pg (ref 26.0–34.0)
MCHC: 33.2 g/dL (ref 30.0–36.0)
MCV: 90.2 fL (ref 78.0–100.0)
Platelets: 255 10*3/uL (ref 150–400)
RBC: 3.87 MIL/uL — ABNORMAL LOW (ref 4.22–5.81)
RDW: 15.2 % (ref 11.5–15.5)
WBC: 9.9 10*3/uL (ref 4.0–10.5)

## 2018-01-26 LAB — GLUCOSE, CAPILLARY
Glucose-Capillary: 137 mg/dL — ABNORMAL HIGH (ref 65–99)
Glucose-Capillary: 149 mg/dL — ABNORMAL HIGH (ref 65–99)

## 2018-01-26 LAB — HEMOGLOBIN A1C
Hgb A1c MFr Bld: 5.9 % — ABNORMAL HIGH (ref 4.8–5.6)
MEAN PLASMA GLUCOSE: 122.63 mg/dL

## 2018-01-26 SURGERY — DEBRIDEMENT, WOUND, WITH CLOSURE
Anesthesia: General | Site: Scrotum

## 2018-01-26 MED ORDER — POLYMYXIN B SULFATE 500000 UNITS IJ SOLR
INTRAMUSCULAR | Status: DC | PRN
  Administered 2018-01-26: 500 mL

## 2018-01-26 MED ORDER — PHENYLEPHRINE 40 MCG/ML (10ML) SYRINGE FOR IV PUSH (FOR BLOOD PRESSURE SUPPORT)
PREFILLED_SYRINGE | INTRAVENOUS | Status: DC | PRN
  Administered 2018-01-26: 80 ug via INTRAVENOUS

## 2018-01-26 MED ORDER — MEPERIDINE HCL 50 MG/ML IJ SOLN: 6.2500 mg | INTRAMUSCULAR | Status: DC | PRN

## 2018-01-26 MED ORDER — FENTANYL CITRATE (PF) 100 MCG/2ML IJ SOLN
25.0000 ug | INTRAMUSCULAR | Status: DC | PRN
  Administered 2018-01-26 (×2): 25 ug via INTRAVENOUS

## 2018-01-26 MED ORDER — LIDOCAINE 2% (20 MG/ML) 5 ML SYRINGE
INTRAMUSCULAR | Status: AC
  Filled 2018-01-26: qty 5

## 2018-01-26 MED ORDER — LIDOCAINE 2% (20 MG/ML) 5 ML SYRINGE
INTRAMUSCULAR | Status: DC | PRN
  Administered 2018-01-26: 100 mg via INTRAVENOUS

## 2018-01-26 MED ORDER — SODIUM CHLORIDE 0.9% FLUSH: 3.0000 mL | Freq: Two times a day (BID) | INTRAVENOUS | Status: DC

## 2018-01-26 MED ORDER — LABETALOL HCL 5 MG/ML IV SOLN
10.0000 mg | INTRAVENOUS | Status: DC | PRN
  Administered 2018-01-26 (×3): 10 mg via INTRAVENOUS
  Filled 2018-01-26: qty 4

## 2018-01-26 MED ORDER — MIDAZOLAM HCL 5 MG/5ML IJ SOLN
INTRAMUSCULAR | Status: DC | PRN
  Administered 2018-01-26: 1 mg via INTRAVENOUS

## 2018-01-26 MED ORDER — ONDANSETRON HCL 4 MG/2ML IJ SOLN
INTRAMUSCULAR | Status: DC | PRN
  Administered 2018-01-26: 4 mg via INTRAVENOUS

## 2018-01-26 MED ORDER — HYDRALAZINE HCL 20 MG/ML IJ SOLN
INTRAMUSCULAR | Status: AC
  Administered 2018-01-26: 10 mg via INTRAVENOUS
  Filled 2018-01-26: qty 1

## 2018-01-26 MED ORDER — MIDAZOLAM HCL 2 MG/2ML IJ SOLN
INTRAMUSCULAR | Status: AC
  Filled 2018-01-26: qty 2

## 2018-01-26 MED ORDER — FENTANYL CITRATE (PF) 100 MCG/2ML IJ SOLN
INTRAMUSCULAR | Status: AC
  Filled 2018-01-26: qty 2

## 2018-01-26 MED ORDER — FENTANYL CITRATE (PF) 100 MCG/2ML IJ SOLN
INTRAMUSCULAR | Status: AC
  Administered 2018-01-26: 25 ug via INTRAVENOUS
  Filled 2018-01-26: qty 2

## 2018-01-26 MED ORDER — HYDRALAZINE HCL 20 MG/ML IJ SOLN
10.0000 mg | Freq: Once | INTRAMUSCULAR | Status: AC
  Administered 2018-01-26: 10 mg via INTRAVENOUS

## 2018-01-26 MED ORDER — ACETAMINOPHEN 10 MG/ML IV SOLN
INTRAVENOUS | Status: AC
  Administered 2018-01-26: 1000 mg via INTRAVENOUS
  Filled 2018-01-26: qty 100

## 2018-01-26 MED ORDER — FENTANYL CITRATE (PF) 100 MCG/2ML IJ SOLN
INTRAMUSCULAR | Status: DC | PRN
  Administered 2018-01-26: 50 ug via INTRAVENOUS
  Administered 2018-01-26 (×2): 25 ug via INTRAVENOUS

## 2018-01-26 MED ORDER — PROPOFOL 10 MG/ML IV BOLUS
INTRAVENOUS | Status: AC
  Filled 2018-01-26: qty 20

## 2018-01-26 MED ORDER — PROMETHAZINE HCL 25 MG/ML IJ SOLN: 6.2500 mg | INTRAMUSCULAR | Status: DC | PRN

## 2018-01-26 MED ORDER — ONDANSETRON HCL 4 MG/2ML IJ SOLN
INTRAMUSCULAR | Status: AC
  Filled 2018-01-26: qty 2

## 2018-01-26 MED ORDER — SODIUM CHLORIDE 0.9 % IV SOLN: 250.0000 mL | INTRAVENOUS | Status: DC | PRN

## 2018-01-26 MED ORDER — ACETAMINOPHEN 325 MG PO TABS: 650.0000 mg | ORAL_TABLET | ORAL | Status: DC | PRN

## 2018-01-26 MED ORDER — PROPOFOL 10 MG/ML IV BOLUS
INTRAVENOUS | Status: DC | PRN
  Administered 2018-01-26: 150 mg via INTRAVENOUS

## 2018-01-26 MED ORDER — PHENYLEPHRINE 40 MCG/ML (10ML) SYRINGE FOR IV PUSH (FOR BLOOD PRESSURE SUPPORT)
PREFILLED_SYRINGE | INTRAVENOUS | Status: AC
  Filled 2018-01-26: qty 10

## 2018-01-26 MED ORDER — LABETALOL HCL 5 MG/ML IV SOLN
INTRAVENOUS | Status: AC
  Administered 2018-01-26: 10 mg via INTRAVENOUS
  Filled 2018-01-26: qty 4

## 2018-01-26 MED ORDER — DEXAMETHASONE SODIUM PHOSPHATE 10 MG/ML IJ SOLN
INTRAMUSCULAR | Status: DC | PRN
  Administered 2018-01-26: 10 mg via INTRAVENOUS

## 2018-01-26 MED ORDER — 0.9 % SODIUM CHLORIDE (POUR BTL) OPTIME
TOPICAL | Status: DC | PRN
  Administered 2018-01-26: 1000 mL

## 2018-01-26 MED ORDER — DEXAMETHASONE SODIUM PHOSPHATE 10 MG/ML IJ SOLN
INTRAMUSCULAR | Status: AC
  Filled 2018-01-26: qty 1

## 2018-01-26 MED ORDER — LACTATED RINGERS IV SOLN
INTRAVENOUS | Status: DC
  Administered 2018-01-26: 06:00:00 via INTRAVENOUS

## 2018-01-26 MED ORDER — CEFAZOLIN SODIUM-DEXTROSE 2-4 GM/100ML-% IV SOLN
2.0000 g | INTRAVENOUS | Status: AC
  Administered 2018-01-26: 2 g via INTRAVENOUS
  Filled 2018-01-26: qty 100

## 2018-01-26 MED ORDER — SODIUM CHLORIDE 0.9 % IV SOLN
INTRAVENOUS | Status: AC
  Filled 2018-01-26: qty 500000

## 2018-01-26 MED ORDER — ACETAMINOPHEN 650 MG RE SUPP
650.0000 mg | RECTAL | Status: DC | PRN
  Filled 2018-01-26: qty 1

## 2018-01-26 MED ORDER — SODIUM CHLORIDE 0.9% FLUSH: 3.0000 mL | INTRAVENOUS | Status: DC | PRN

## 2018-01-26 MED ORDER — ACETAMINOPHEN 10 MG/ML IV SOLN
1000.0000 mg | Freq: Once | INTRAVENOUS | Status: DC | PRN
  Administered 2018-01-26: 1000 mg via INTRAVENOUS

## 2018-01-26 SURGICAL SUPPLY — 41 items
BANDAGE ACE 4X5 VEL STRL LF (GAUZE/BANDAGES/DRESSINGS) ×3 IMPLANT
BNDG GAUZE ELAST 4 BULKY (GAUZE/BANDAGES/DRESSINGS) IMPLANT
CONT SPEC 4OZ CLIKSEAL STRL BL (MISCELLANEOUS) ×3 IMPLANT
CONT SPECI 4OZ STER CLIK (MISCELLANEOUS) ×2 IMPLANT
DECANTER SPIKE VIAL GLASS SM (MISCELLANEOUS) IMPLANT
DRAPE INCISE IOBAN 66X45 STRL (DRAPES) ×3 IMPLANT
DRAPE LAPAROTOMY T 102X78X121 (DRAPES) ×3 IMPLANT
DRAPE UNDERBUTTOCKS STRL (DRAPE) ×2 IMPLANT
DRAPE UTILITY XL STRL (DRAPES) ×3 IMPLANT
DRESSING DUODERM 4X4 STERILE (GAUZE/BANDAGES/DRESSINGS) IMPLANT
DRSG ADAPTIC 3X8 NADH LF (GAUZE/BANDAGES/DRESSINGS) ×3 IMPLANT
DRSG PAD ABDOMINAL 8X10 ST (GAUZE/BANDAGES/DRESSINGS) IMPLANT
ELECT REM PT RETURN 15FT ADLT (MISCELLANEOUS) ×3 IMPLANT
GAUZE SPONGE 4X4 12PLY STRL (GAUZE/BANDAGES/DRESSINGS) ×3 IMPLANT
GLOVE BIO SURGEON STRL SZ 6.5 (GLOVE) ×2 IMPLANT
GLOVE BIO SURGEONS STRL SZ 6.5 (GLOVE) ×1
GOWN STRL REUS W/TWL LRG LVL3 (GOWN DISPOSABLE) ×6 IMPLANT
HANDPIECE INTERPULSE COAX TIP (DISPOSABLE)
KIT BASIN OR (CUSTOM PROCEDURE TRAY) ×3 IMPLANT
NEEDLE HYPO 22GX1.5 SAFETY (NEEDLE) IMPLANT
PACK GENERAL/GYN (CUSTOM PROCEDURE TRAY) ×3 IMPLANT
PACK LITHOTOMY IV (CUSTOM PROCEDURE TRAY) ×2 IMPLANT
PAD ABD 8X10 STRL (GAUZE/BANDAGES/DRESSINGS) ×2 IMPLANT
PAD CAST 4YDX4 CTTN HI CHSV (CAST SUPPLIES) ×1 IMPLANT
PADDING CAST COTTON 4X4 STRL (CAST SUPPLIES) ×3
SET HNDPC FAN SPRY TIP SCT (DISPOSABLE) IMPLANT
SLEEVE SURGEON STRL (DRAPES) ×2 IMPLANT
SOL PREP PROV IODINE SCRUB 4OZ (MISCELLANEOUS) ×3 IMPLANT
SPONGE LAP 18X18 X RAY DECT (DISPOSABLE) ×4 IMPLANT
STAPLER VISISTAT 35W (STAPLE) ×3 IMPLANT
SUT MNCRL 3 0 RB1 (SUTURE) IMPLANT
SUT MNCRL AB 4-0 PS2 18 (SUTURE) ×3 IMPLANT
SUT MONOCRYL 3 0 RB1 (SUTURE) ×2
SUT SILK 4 0 PS 2 (SUTURE) IMPLANT
SUT VIC AB 5-0 PS2 18 (SUTURE) IMPLANT
SWAB COLLECTION DEVICE MRSA (MISCELLANEOUS) IMPLANT
SWAB CULTURE ESWAB REG 1ML (MISCELLANEOUS) ×3 IMPLANT
SYR BULB IRRIGATION 50ML (SYRINGE) ×2 IMPLANT
SYR CONTROL 10ML LL (SYRINGE) IMPLANT
TOWEL OR 17X26 10 PK STRL BLUE (TOWEL DISPOSABLE) ×3 IMPLANT
TOWEL OR NON WOVEN STRL DISP B (DISPOSABLE) ×2 IMPLANT

## 2018-01-26 NOTE — Op Note (Signed)
DATE OF OPERATION: 01/26/2018  LOCATION: [facility] Room Outpatient  PREOPERATIVE DIAGNOSIS: Groin wound 3 x 6 cm  POSTOPERATIVE DIAGNOSIS: Same  PROCEDURE: 1. Debridement of groin wound 1 x 6 cm skin  2. Placement of cellerate 5 gm powder and gel 3. Partial closure of 1 x 1 cm  SURGEON: Claire Sanger Dillingham, DO  EBL: 5 cc  CONDITION: Stable  COMPLICATIONS: None  INDICATION: The patient, Neythan, is a 67 y.o. male born on 04/08/1951, is here for treatment of a groin wound.  The area was initially an infection that was cleared by urology.  A thigh flap was done and the area closed.  He opened and was closed again.  This past week he opened again at the lower portion of the wound which is adjacent to the rectal area.   PROCEDURE DETAILS:  The patient was seen prior to surgery and marked.  The IV antibiotics were given. The patient was taken to the operating room and given a general anesthetic. A standard time out was performed and all information was confirmed by those in the room. SCDs were placed.  The patient was placed with leg elevated and abducted.  The 3 x 6 cm wound was irrigated with saline and antibiotic solution.  The tissue scissors were used to debride the 1 x 6 cm edge of the skin.  The superior 1 cm portion of the wound was closed with a 3-0 Monocryl suture vertical mattress.  All of the cellerate powder and gel was applied and gauze.  The patient was allowed to wake up and taken to recovery room in stable condition at the end of the case. The family was notified at the end of the case.

## 2018-01-26 NOTE — Anesthesia Procedure Notes (Signed)
Procedure Name: LMA Insertion Date/Time: 01/26/2018 7:34 AM Performed by: Florene Route, CRNA Patient Re-evaluated:Patient Re-evaluated prior to induction Oxygen Delivery Method: Circle system utilized Preoxygenation: Pre-oxygenation with 100% oxygen Induction Type: IV induction Ventilation: Mask ventilation without difficulty LMA: LMA inserted LMA Size: 4.0 Number of attempts: 1 Placement Confirmation: positive ETCO2 and breath sounds checked- equal and bilateral Tube secured with: Tape Dental Injury: Teeth and Oropharynx as per pre-operative assessment

## 2018-01-26 NOTE — Interval H&P Note (Signed)
History and Physical Interval Note:  01/26/2018 7:10 AM  Tish Frederickson  has presented today for surgery, with the diagnosis of Fournier's Gangrene Of Scrotum  The various methods of treatment have been discussed with the patient and family. After consideration of risks, benefits and other options for treatment, the patient has consented to  Procedure(s): REVISION OF PERINEUM WOUND WITH POSSIBLE CLOSURE OF PERINEUM, POSSIBLE CELLERATE COLLAGEN (N/A) as a surgical intervention .  The patient's history has been reviewed, patient examined, no change in status, stable for surgery.  I have reviewed the patient's chart and labs.  Questions were answered to the patient's satisfaction.     Alena Bills Regino Fournet

## 2018-01-26 NOTE — Progress Notes (Signed)
Phase II  PACU  Pt was hel in pahse II for 2=over 2 hours fo uncontrolled BP. DR hatchet and DR dullingham made aware. Pt was treated with 10  Hydralazine IV  and  total Labetalol IV . BP decreased to 179/91. Per DR Cliffton Asters , ok to discharge pt to facility via private vehicle- with daughter.    Report called to the facility- [facility] and spoke to [place], [place]  .

## 2018-01-26 NOTE — Discharge Instructions (Signed)
Keep legs together. Dressing change daily starting Sunday. Cellerate daily starting Sunday.

## 2018-01-26 NOTE — Transfer of Care (Signed)
Immediate Anesthesia Transfer of Care Note  Patient: Patrick Aguilar  Procedure(s) Performed: REVISION OF PERINEUM WOUND WITH DEBRIDEMENT, PARTIAL CLOSURE OF PERINEUM, PLACEMENT OF CELLERATE COLLAGEN (N/A Scrotum)  Patient Location: PACU  Anesthesia Type:General  Level of Consciousness: awake, alert  and oriented  Airway & Oxygen Therapy: Patient Spontanous Breathing and Patient connected to face mask oxygen  Post-op Assessment: Report given to RN and Post -op Vital signs reviewed and stable  Post vital signs: Reviewed and stable  Last Vitals:  Vitals Value Taken Time  BP    Temp    Pulse 88 01/26/2018  8:19 AM  Resp 12 01/26/2018  8:19 AM  SpO2 100 % 01/26/2018  8:19 AM  Vitals shown include unvalidated device data.  Last Pain:  Vitals:   01/26/18 0602  TempSrc:   PainSc: 10-Worst pain ever         Complications: No apparent anesthesia complications

## 2018-01-26 NOTE — Anesthesia Preprocedure Evaluation (Addendum)
Anesthesia Evaluation  Patient identified by MRN, date of birth, ID band Patient awake    Reviewed: Allergy & Precautions, NPO status , Patient's Chart, lab work & pertinent test results  Airway Mallampati: III  TM Distance: <3 FB Neck ROM: Limited    Dental  (+) Poor Dentition, Loose, Missing   Pulmonary shortness of breath, at rest and lying, COPD,  COPD inhaler, Current Smoker, former smoker,   O2 saturation 79% on 6L  Pulmonary exam normal breath sounds clear to auscultation (-) decreased breath sounds(-) wheezing      Cardiovascular hypertension, Pt. on medications negative cardio ROS Normal cardiovascular exam Rhythm:Regular Rate:Normal     Neuro/Psych PSYCHIATRIC DISORDERS Anxiety negative neurological ROS     GI/Hepatic Neg liver ROS, GERD  ,  Endo/Other  diabetes, Poorly Controlled, Insulin Dependent, Oral Hypoglycemic Agentsuntreated  Renal/GU Renal InsufficiencyRenal disease  negative genitourinary   Musculoskeletal negative musculoskeletal ROS (+)   Abdominal Normal abdominal exam  (+)   Peds negative pediatric ROS (+)  Hematology  (+) Blood dyscrasia, anemia ,   Anesthesia Other Findings   Reproductive/Obstetrics negative OB ROS                            Anesthesia Physical  Anesthesia Plan  ASA: III  Anesthesia Plan: General   Post-op Pain Management:    Induction: Intravenous  PONV Risk Score and Plan: 1 and Ondansetron  Airway Management Planned: LMA  Additional Equipment:   Intra-op Plan:   Post-operative Plan:   Informed Consent: I have reviewed the patients History and Physical, chart, labs and discussed the procedure including the risks, benefits and alternatives for the proposed anesthesia with the patient or authorized representative who has indicated his/her understanding and acceptance.   Dental advisory given  Plan Discussed with: CRNA and  Surgeon  Anesthesia Plan Comments:         Anesthesia Quick Evaluation

## 2018-02-03 NOTE — Anesthesia Postprocedure Evaluation (Signed)
Anesthesia Post Note  Patient: Patrick Aguilar  Procedure(s) Performed: REVISION OF PERINEUM WOUND WITH DEBRIDEMENT, PARTIAL CLOSURE OF PERINEUM, PLACEMENT OF CELLERATE COLLAGEN (N/A Scrotum)     Patient location during evaluation: PACU Anesthesia Type: General Level of consciousness: awake Pain management: pain level controlled Vital Signs Assessment: post-procedure vital signs reviewed and stable Respiratory status: spontaneous breathing Cardiovascular status: stable Postop Assessment: no apparent nausea or vomiting Anesthetic complications: no    Last Vitals:  Vitals:   01/26/18 1215 01/26/18 1230  BP: (!) 186/101 (!) 179/90  Pulse: 95 95  Resp:  16  Temp:    SpO2: 93% 98%    Last Pain:  Vitals:   01/26/18 0930  TempSrc:   PainSc: Asleep   Pain Goal:                 Lenette Rau JR,JOHN Jacinda Kanady

## 2018-03-09 ENCOUNTER — Other Ambulatory Visit: Payer: Self-pay

## 2018-03-09 ENCOUNTER — Encounter (HOSPITAL_COMMUNITY): Payer: Self-pay | Admitting: *Deleted

## 2018-03-09 NOTE — Progress Notes (Signed)
SDW-pre-op call completed by both pt and pt nurse, Demetra ShinerMary Lewis-Dodson, LPN with pt verbal consent. Pt denies SOB, chest pain, and being under the care of a cardiologist. Pt denies having a stress test. Requested recent labs from Dr[facility] Sherryll [facility]. Nurse stated that pt does not take Aspirin. Nurse made aware to have pt stop taking vitamins, fish oil and herbal medications.  Do not take any NSAIDs ie: Ibuprofen, Advil, Naproxen (Aleve), Motrin, Diclofenac (Voltaren), BC and Goody Powder. Nurse stated that pt no longer takes insulin at all. Nurse made aware that pt do not take Metformin on DOS. Nurse made aware to have pt check BG every 2 hours prior to arrival to hospital on DOS. Nurse made aware to treat a BG < 70 with 4 glucose tabs or glucose gel or 4 ounces of apple or cranberry juice, wait 15 minutes after intervention to recheck BG, if BG remains < 70, call Short Stay unit to speak with a nurse. Nurse verbalized understanding of all pre-op instructions.

## 2018-03-09 NOTE — Progress Notes (Signed)
   03/09/18 1900  OBSTRUCTIVE SLEEP APNEA  Have you ever been diagnosed with sleep apnea through a sleep study? No  Do you snore loudly (loud enough to be heard through closed doors)?  1  Do you often feel tired, fatigued, or sleepy during the daytime (such as falling asleep during driving or talking to someone)? 0  Has anyone observed you stop breathing during your sleep? 0  Do you have, or are you being treated for high blood pressure? 1  BMI more than 35 kg/m2? 0  Age > 50 (1-yes) 1  Neck circumference greater than:Male 16 inches or larger, Male 17inches or larger?  (assess dos)  Male Gender (Yes=1) 1  Obstructive Sleep Apnea Score 4

## 2018-03-12 ENCOUNTER — Ambulatory Visit (HOSPITAL_COMMUNITY): Payer: Medicare HMO | Admitting: Anesthesiology

## 2018-03-12 ENCOUNTER — Ambulatory Visit (HOSPITAL_COMMUNITY)
Admission: RE | Admit: 2018-03-12 | Discharge: 2018-03-12 | Disposition: A | Discharge status: Home / Self Care | Payer: Medicare HMO | Source: Ambulatory Visit | Attending: Plastic Surgery | Admitting: Plastic Surgery

## 2018-03-12 ENCOUNTER — Encounter (HOSPITAL_COMMUNITY): Payer: Self-pay | Admitting: Certified Registered Nurse Anesthetist

## 2018-03-12 ENCOUNTER — Encounter (HOSPITAL_COMMUNITY)
Admission: RE | Disposition: A | Discharge status: Home / Self Care | Payer: Self-pay | Source: Ambulatory Visit | Attending: Plastic Surgery

## 2018-03-12 ENCOUNTER — Ambulatory Visit: Payer: Self-pay | Admitting: Plastic Surgery

## 2018-03-12 DIAGNOSIS — M1990 Unspecified osteoarthritis, unspecified site: Secondary | ICD-10-CM | POA: Diagnosis not present

## 2018-03-12 DIAGNOSIS — E119 Type 2 diabetes mellitus without complications: Secondary | ICD-10-CM | POA: Diagnosis not present

## 2018-03-12 DIAGNOSIS — I10 Essential (primary) hypertension: Secondary | ICD-10-CM | POA: Insufficient documentation

## 2018-03-12 DIAGNOSIS — Z801 Family history of malignant neoplasm of trachea, bronchus and lung: Secondary | ICD-10-CM | POA: Diagnosis not present

## 2018-03-12 DIAGNOSIS — Z7984 Long term (current) use of oral hypoglycemic drugs: Secondary | ICD-10-CM | POA: Insufficient documentation

## 2018-03-12 DIAGNOSIS — K219 Gastro-esophageal reflux disease without esophagitis: Secondary | ICD-10-CM | POA: Diagnosis not present

## 2018-03-12 DIAGNOSIS — Z87891 Personal history of nicotine dependence: Secondary | ICD-10-CM | POA: Diagnosis not present

## 2018-03-12 DIAGNOSIS — X58XXXA Exposure to other specified factors, initial encounter: Secondary | ICD-10-CM | POA: Insufficient documentation

## 2018-03-12 DIAGNOSIS — S31501A Unspecified open wound of unspecified external genital organs, male, initial encounter: Secondary | ICD-10-CM | POA: Diagnosis not present

## 2018-03-12 DIAGNOSIS — F419 Anxiety disorder, unspecified: Secondary | ICD-10-CM | POA: Insufficient documentation

## 2018-03-12 DIAGNOSIS — Z7951 Long term (current) use of inhaled steroids: Secondary | ICD-10-CM | POA: Insufficient documentation

## 2018-03-12 DIAGNOSIS — J449 Chronic obstructive pulmonary disease, unspecified: Secondary | ICD-10-CM | POA: Diagnosis not present

## 2018-03-12 DIAGNOSIS — Z79899 Other long term (current) drug therapy: Secondary | ICD-10-CM | POA: Diagnosis not present

## 2018-03-12 DIAGNOSIS — R0600 Dyspnea, unspecified: Secondary | ICD-10-CM | POA: Insufficient documentation

## 2018-03-12 DIAGNOSIS — Z8052 Family history of malignant neoplasm of bladder: Secondary | ICD-10-CM | POA: Diagnosis not present

## 2018-03-12 DIAGNOSIS — Z794 Long term (current) use of insulin: Secondary | ICD-10-CM | POA: Diagnosis not present

## 2018-03-12 DIAGNOSIS — T8130XA Disruption of wound, unspecified, initial encounter: Secondary | ICD-10-CM

## 2018-03-12 HISTORY — PX: I & D EXTREMITY: SHX5045

## 2018-03-12 LAB — CBC
HCT: 42.5 % (ref 39.0–52.0)
HEMOGLOBIN: 13.7 g/dL (ref 13.0–17.0)
MCH: 28.6 pg (ref 26.0–34.0)
MCHC: 32.2 g/dL (ref 30.0–36.0)
MCV: 88.7 fL (ref 78.0–100.0)
PLATELETS: 230 10*3/uL (ref 150–400)
RBC: 4.79 MIL/uL (ref 4.22–5.81)
RDW: 13.2 % (ref 11.5–15.5)
WBC: 9 10*3/uL (ref 4.0–10.5)

## 2018-03-12 LAB — BASIC METABOLIC PANEL
ANION GAP: 11 (ref 5–15)
BUN: 37 mg/dL — AB (ref 8–23)
CO2: 23 mmol/L (ref 22–32)
CREATININE: 1.54 mg/dL — AB (ref 0.61–1.24)
Calcium: 9.3 mg/dL (ref 8.9–10.3)
Chloride: 104 mmol/L (ref 98–111)
GFR calc Af Amer: 52 mL/min — ABNORMAL LOW (ref >60)
GFR, EST NON AFRICAN AMERICAN: 45 mL/min — AB (ref >60)
GLUCOSE: 152 mg/dL — AB (ref 70–99)
Potassium: 3.9 mmol/L (ref 3.5–5.1)
Sodium: 138 mmol/L (ref 135–145)

## 2018-03-12 LAB — GLUCOSE, CAPILLARY
GLUCOSE-CAPILLARY: 169 mg/dL — AB (ref 70–99)
GLUCOSE-CAPILLARY: 148 mg/dL — AB (ref 70–99)
Glucose-Capillary: 147 mg/dL — ABNORMAL HIGH (ref 70–99)

## 2018-03-12 SURGERY — IRRIGATION AND DEBRIDEMENT EXTREMITY
Anesthesia: General | Site: Perineum

## 2018-03-12 MED ORDER — LIDOCAINE 2% (20 MG/ML) 5 ML SYRINGE
INTRAMUSCULAR | Status: DC | PRN
  Administered 2018-03-12: 100 mg via INTRAVENOUS

## 2018-03-12 MED ORDER — SUCCINYLCHOLINE CHLORIDE 200 MG/10ML IV SOSY
PREFILLED_SYRINGE | INTRAVENOUS | Status: AC
  Filled 2018-03-12: qty 10

## 2018-03-12 MED ORDER — HYDROMORPHONE HCL 1 MG/ML IJ SOLN: 0.2500 mg | INTRAMUSCULAR | Status: DC | PRN

## 2018-03-12 MED ORDER — MIDAZOLAM HCL 2 MG/2ML IJ SOLN
INTRAMUSCULAR | Status: AC
  Filled 2018-03-12: qty 2

## 2018-03-12 MED ORDER — PHENYLEPHRINE 40 MCG/ML (10ML) SYRINGE FOR IV PUSH (FOR BLOOD PRESSURE SUPPORT)
PREFILLED_SYRINGE | INTRAVENOUS | Status: AC
  Filled 2018-03-12: qty 10

## 2018-03-12 MED ORDER — SODIUM CHLORIDE 0.9 % IR SOLN
Status: DC | PRN
  Administered 2018-03-12: 1000 mL

## 2018-03-12 MED ORDER — DEXAMETHASONE SODIUM PHOSPHATE 10 MG/ML IJ SOLN
INTRAMUSCULAR | Status: AC
  Filled 2018-03-12: qty 2

## 2018-03-12 MED ORDER — SUGAMMADEX SODIUM 200 MG/2ML IV SOLN
INTRAVENOUS | Status: AC
  Filled 2018-03-12: qty 4

## 2018-03-12 MED ORDER — MIDAZOLAM HCL 5 MG/5ML IJ SOLN
INTRAMUSCULAR | Status: DC | PRN
  Administered 2018-03-12: 2 mg via INTRAVENOUS

## 2018-03-12 MED ORDER — ONDANSETRON HCL 4 MG/2ML IJ SOLN
INTRAMUSCULAR | Status: AC
  Filled 2018-03-12: qty 6

## 2018-03-12 MED ORDER — CEFAZOLIN SODIUM-DEXTROSE 2-4 GM/100ML-% IV SOLN
INTRAVENOUS | Status: AC
  Filled 2018-03-12: qty 100

## 2018-03-12 MED ORDER — BUPIVACAINE-EPINEPHRINE (PF) 0.25% -1:200000 IJ SOLN
INTRAMUSCULAR | Status: AC
  Filled 2018-03-12: qty 30

## 2018-03-12 MED ORDER — ACETAMINOPHEN 325 MG PO TABS: 650.0000 mg | ORAL_TABLET | ORAL | Status: DC | PRN

## 2018-03-12 MED ORDER — SODIUM CHLORIDE 0.9% FLUSH: 3.0000 mL | Freq: Two times a day (BID) | INTRAVENOUS | Status: DC

## 2018-03-12 MED ORDER — EPHEDRINE SULFATE 50 MG/ML IJ SOLN
INTRAMUSCULAR | Status: DC | PRN
  Administered 2018-03-12: 10 mg via INTRAVENOUS

## 2018-03-12 MED ORDER — CEFAZOLIN SODIUM-DEXTROSE 2-4 GM/100ML-% IV SOLN
2.0000 g | INTRAVENOUS | Status: AC
  Administered 2018-03-12: 2 g via INTRAVENOUS

## 2018-03-12 MED ORDER — ACETAMINOPHEN 650 MG RE SUPP: 650.0000 mg | RECTAL | Status: DC | PRN

## 2018-03-12 MED ORDER — ROCURONIUM BROMIDE 10 MG/ML (PF) SYRINGE
PREFILLED_SYRINGE | INTRAVENOUS | Status: AC
  Filled 2018-03-12: qty 20

## 2018-03-12 MED ORDER — CLOBETASOL PROPIONATE 0.05 % EX OINT
TOPICAL_OINTMENT | CUTANEOUS | Status: AC
Start: 2018-03-12 — End: 2018-03-12
  Administered 2018-03-12: 1 via TOPICAL
  Filled 2018-03-12: qty 60

## 2018-03-12 MED ORDER — PROPOFOL 10 MG/ML IV BOLUS
INTRAVENOUS | Status: DC | PRN
  Administered 2018-03-12: 160 mg via INTRAVENOUS

## 2018-03-12 MED ORDER — BUPIVACAINE-EPINEPHRINE 0.25% -1:200000 IJ SOLN
INTRAMUSCULAR | Status: DC | PRN
  Administered 2018-03-12: 15 mL

## 2018-03-12 MED ORDER — FENTANYL CITRATE (PF) 250 MCG/5ML IJ SOLN
INTRAMUSCULAR | Status: AC
  Filled 2018-03-12: qty 5

## 2018-03-12 MED ORDER — SODIUM CHLORIDE 0.9% FLUSH: 3.0000 mL | INTRAVENOUS | Status: DC | PRN

## 2018-03-12 MED ORDER — SODIUM CHLORIDE 0.9 % IV SOLN
INTRAVENOUS | Status: AC
  Filled 2018-03-12: qty 500000

## 2018-03-12 MED ORDER — SODIUM CHLORIDE 0.9 % IV SOLN: 250.0000 mL | INTRAVENOUS | Status: DC | PRN

## 2018-03-12 MED ORDER — LACTATED RINGERS IV SOLN
INTRAVENOUS | Status: DC
  Administered 2018-03-12: 10 mL/h via INTRAVENOUS

## 2018-03-12 MED ORDER — PROMETHAZINE HCL 25 MG/ML IJ SOLN: 6.2500 mg | INTRAMUSCULAR | Status: DC | PRN

## 2018-03-12 MED ORDER — ONDANSETRON HCL 4 MG/2ML IJ SOLN
INTRAMUSCULAR | Status: DC | PRN
  Administered 2018-03-12: 4 mg via INTRAVENOUS

## 2018-03-12 MED ORDER — LIDOCAINE 2% (20 MG/ML) 5 ML SYRINGE
INTRAMUSCULAR | Status: AC
  Filled 2018-03-12: qty 5

## 2018-03-12 MED ORDER — FENTANYL CITRATE (PF) 100 MCG/2ML IJ SOLN
INTRAMUSCULAR | Status: DC | PRN
  Administered 2018-03-12 (×2): 50 ug via INTRAVENOUS

## 2018-03-12 MED ORDER — SODIUM CHLORIDE 0.9 % IV SOLN
INTRAVENOUS | Status: DC | PRN
  Administered 2018-03-12: 500 mL

## 2018-03-12 SURGICAL SUPPLY — 46 items
BANDAGE ACE 4X5 VEL STRL LF (GAUZE/BANDAGES/DRESSINGS) IMPLANT
BLADE CLIPPER SURG (BLADE) IMPLANT
BNDG GAUZE ELAST 4 BULKY (GAUZE/BANDAGES/DRESSINGS) IMPLANT
CANISTER SUCT 3000ML PPV (MISCELLANEOUS) ×3 IMPLANT
CHLORAPREP W/TINT 26ML (MISCELLANEOUS) IMPLANT
COVER SURGICAL LIGHT HANDLE (MISCELLANEOUS) ×3 IMPLANT
DECANTER SPIKE VIAL GLASS SM (MISCELLANEOUS) ×2 IMPLANT
DRAPE HALF SHEET 40X57 (DRAPES) IMPLANT
DRAPE INCISE IOBAN 66X45 STRL (DRAPES) IMPLANT
DRAPE ORTHO SPLIT 77X108 STRL (DRAPES)
DRAPE SURG ORHT 6 SPLT 77X108 (DRAPES) IMPLANT
DRESSING HYDROCOLLOID 4X4 (GAUZE/BANDAGES/DRESSINGS) ×3 IMPLANT
DRSG ADAPTIC 3X8 NADH LF (GAUZE/BANDAGES/DRESSINGS) IMPLANT
DRSG PAD ABDOMINAL 8X10 ST (GAUZE/BANDAGES/DRESSINGS) IMPLANT
DRSG VAC ATS LRG SENSATRAC (GAUZE/BANDAGES/DRESSINGS) IMPLANT
DRSG VAC ATS MED SENSATRAC (GAUZE/BANDAGES/DRESSINGS) IMPLANT
DRSG VAC ATS SM SENSATRAC (GAUZE/BANDAGES/DRESSINGS) IMPLANT
ELECT REM PT RETURN 9FT ADLT (ELECTROSURGICAL) ×3
ELECTRODE REM PT RTRN 9FT ADLT (ELECTROSURGICAL) ×1 IMPLANT
GAUZE SPONGE 4X4 12PLY STRL (GAUZE/BANDAGES/DRESSINGS) IMPLANT
GAUZE XEROFORM 5X9 LF (GAUZE/BANDAGES/DRESSINGS) ×2 IMPLANT
GEL ULTRASOUND 20GR AQUASONIC (MISCELLANEOUS) IMPLANT
GLOVE BIO SURGEON STRL SZ 6.5 (GLOVE) ×4 IMPLANT
GLOVE BIO SURGEONS STRL SZ 6.5 (GLOVE) ×2
GOWN STRL REUS W/ TWL LRG LVL3 (GOWN DISPOSABLE) ×2 IMPLANT
GOWN STRL REUS W/TWL LRG LVL3 (GOWN DISPOSABLE) ×6
HANDPIECE INTERPULSE COAX TIP (DISPOSABLE)
KIT BASIN OR (CUSTOM PROCEDURE TRAY) ×3 IMPLANT
KIT TURNOVER KIT B (KITS) ×3 IMPLANT
NDL HYPO 25GX1X1/2 BEV (NEEDLE) IMPLANT
NEEDLE HYPO 25GX1X1/2 BEV (NEEDLE) ×3 IMPLANT
NS IRRIG 1000ML POUR BTL (IV SOLUTION) ×3 IMPLANT
PACK GENERAL/GYN (CUSTOM PROCEDURE TRAY) ×3 IMPLANT
PAD ABD 8X10 STRL (GAUZE/BANDAGES/DRESSINGS) ×4 IMPLANT
PAD ARMBOARD 7.5X6 YLW CONV (MISCELLANEOUS) ×6 IMPLANT
PAD NEG PRESSURE SENSATRAC (MISCELLANEOUS) IMPLANT
SET HNDPC FAN SPRY TIP SCT (DISPOSABLE) IMPLANT
SUT CHROMIC 3 0 SH 27 (SUTURE) ×4 IMPLANT
SUT CHROMIC 4 0 PS 2 18 (SUTURE) ×4 IMPLANT
SUT MON AB 5-0 PS2 18 (SUTURE) ×2 IMPLANT
SUT SILK 4 0 PS 2 (SUTURE) IMPLANT
SUT VIC AB 5-0 PS2 18 (SUTURE) IMPLANT
SYR CONTROL 10ML LL (SYRINGE) ×2 IMPLANT
TOWEL OR 17X24 6PK STRL BLUE (TOWEL DISPOSABLE) IMPLANT
TOWEL OR 17X26 10 PK STRL BLUE (TOWEL DISPOSABLE) ×3 IMPLANT
UNDERPAD 30X30 (UNDERPADS AND DIAPERS) IMPLANT

## 2018-03-12 NOTE — Anesthesia Procedure Notes (Signed)
Procedure Name: LMA Insertion Date/Time: 03/12/2018 12:03 PM Performed by: White, Cordella RegisterKelsey Tena Cassity Christian, CRNA Pre-anesthesia Checklist: Patient identified, Emergency Drugs available, Suction available and Patient being monitored Patient Re-evaluated:Patient Re-evaluated prior to induction Oxygen Delivery Method: Circle System Utilized Preoxygenation: Pre-oxygenation with 100% oxygen Induction Type: IV induction Ventilation: Mask ventilation without difficulty LMA: LMA inserted LMA Size: 4.0 Number of attempts: 1 Airway Equipment and Method: Bite block Placement Confirmation: positive ETCO2 Tube secured with: Tape Dental Injury: Teeth and Oropharynx as per pre-operative assessment

## 2018-03-12 NOTE — Anesthesia Preprocedure Evaluation (Signed)
Anesthesia Evaluation  Patient identified by MRN, date of birth, ID band Patient awake    Reviewed: Allergy & Precautions, NPO status , Patient's Chart, lab work & pertinent test results  Airway Mallampati: II  TM Distance: >3 FB Neck ROM: Full    Dental no notable dental hx.    Pulmonary COPD, former smoker,    Pulmonary exam normal breath sounds clear to auscultation       Cardiovascular hypertension, Normal cardiovascular exam Rhythm:Regular Rate:Normal     Neuro/Psych negative neurological ROS  negative psych ROS   GI/Hepatic Neg liver ROS, GERD  ,  Endo/Other  diabetes, Insulin Dependent  Renal/GU negative Renal ROS  negative genitourinary   Musculoskeletal negative musculoskeletal ROS (+)   Abdominal   Peds negative pediatric ROS (+)  Hematology negative hematology ROS (+)   Anesthesia Other Findings   Reproductive/Obstetrics negative OB ROS                             Anesthesia Physical Anesthesia Plan  ASA: III  Anesthesia Plan: General   Post-op Pain Management:    Induction: Intravenous  PONV Risk Score and Plan: 2 and Ondansetron and Treatment may vary due to age or medical condition  Airway Management Planned: LMA and Oral ETT  Additional Equipment:   Intra-op Plan:   Post-operative Plan: Extubation in OR  Informed Consent: I have reviewed the patients History and Physical, chart, labs and discussed the procedure including the risks, benefits and alternatives for the proposed anesthesia with the patient or authorized representative who has indicated his/her understanding and acceptance.   Dental advisory given  Plan Discussed with: CRNA and Surgeon  Anesthesia Plan Comments:         Anesthesia Quick Evaluation

## 2018-03-12 NOTE — Progress Notes (Signed)
Report given to CJ RN as caregiver 

## 2018-03-12 NOTE — Op Note (Signed)
DATE OF OPERATION: 03/12/2018  [facility]  PREOPERATIVE DIAGNOSIS: perineal wound   POSTOPERATIVE DIAGNOSIS: Same  PROCEDURE: excision of perineal wound skin 6 cm and primary layered closure of 3 x 6 cm area  SURGEON: Claire Sanger Dillingham, DO  EBL: 10 cc  CONDITION: Stable  COMPLICATIONS: None  INDICATION: The patient, Patrick Aguilar, is a 67 y.o. 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   PROCEDURE DETAILS:  The patient was seen prior to surgery and marked.  The IV antibiotics were given. The patient was taken to the operating room and given a general anesthetic. A standard time out was performed and all information was confirmed by those in the room. SCDs were placed.   The patient was placed with legs up and separated.  The perineal area was prepped and draped.  Local was injected for intraoperative hemostasis and post operative pain control.  The #10 blade was used to excise the 6 cm rim of skin at the wound.  The base was debrided with the curette.  The entire area was irrigated with antibiotic solution and saline.  The chromic 3-0 and Monocryl 5-0 was used to close the wound with vertical mattress sutures.  Clobetasol was placed on the left neck rash.  The wound was dressed with xeroform and ABD sterile gauze. The patient was allowed to wake up and taken to recovery room in stable condition at the end of the case. The family was notified at the end of the case.

## 2018-03-12 NOTE — Transfer of Care (Signed)
Immediate Anesthesia Transfer of Care Note  Patient: Patrick FredericksonCharles W Aguilar  Procedure(s) Performed: IRRIGATION AND DEBRIDEMENT PERIMUM WOUND WITH CLOSURE (N/A Perineum)  Patient Location: PACU  Anesthesia Type:General  Level of Consciousness: awake, alert , oriented and patient cooperative  Airway & Oxygen Therapy: Patient Spontanous Breathing  Post-op Assessment: Report given to RN and Post -op Vital signs reviewed and stable  Post vital signs: Reviewed and stable  Last Vitals:  Vitals Value Taken Time  BP 123/82 03/12/2018 12:59 PM  Temp    Pulse 83 03/12/2018  1:00 PM  Resp 21 03/12/2018  1:00 PM  SpO2 93 % 03/12/2018  1:00 PM  Vitals shown include unvalidated device data.  Last Pain:  Vitals:   03/12/18 1024  TempSrc:   PainSc: 5       Patients Stated Pain Goal: 3 (03/12/18 1024)  Complications: No apparent anesthesia complications

## 2018-03-12 NOTE — H&P (Signed)
Patrick Aguilar is an 67 y.o. male.   Chief Complaint: perineal wound HPI: The patient is a 67 yrs old wm here for treatment of his perineal wound.  He underwent extensive debridement in the past for necrotizing fasciitis and closure.  He has opened the closure.  He is doing much better. The area is clean and there is no sign of infection.  He wishes to attempt closure again.  Past Medical History:  Diagnosis Date  . Anxiety   . Arthritis   . Complication of anesthesia    low respirations, low BP  . COPD (chronic obstructive pulmonary disease) ([place] [facility])   . Diabetes mellitus without complication ([place])    type 2  . GERD (gastroesophageal reflux disease)   . History of ARDS   . Hypertension   . Respiratory failure, acute ([place])   . Shortness of breath dyspnea     Past Surgical History:  Procedure Laterality Date  . APPENDECTOMY    . APPLICATION OF A-CELL OF CHEST/ABDOMEN N/A 01/08/2018   Procedure: APPLICATION OF A-CELL OF GROIN;  Surgeon: Wallace Going, DO;  Location: [facility];  Service: Plastics;  Laterality: N/A;  . APPLICATION OF A-CELL OF EXTREMITY N/A 12/25/2017   Procedure: APPLICATION OF A-CELL;  Surgeon: Wallace Going, DO;  Location: [facility];  Service: Plastics;  Laterality: N/A;  . BACK SURGERY     x5  . CARDIAC CATHETERIZATION  06   neg  . CERVICAL DISC SURGERY    . COLONOSCOPY    . DEBRIDEMENT AND CLOSURE WOUND N/A 01/26/2018   Procedure: REVISION OF PERINEUM WOUND WITH DEBRIDEMENT, PARTIAL CLOSURE OF PERINEUM, PLACEMENT OF [place];  Surgeon: Wallace Going, DO;  Location: [facility];  Service: Plastics;  Laterality: N/A;  . groin wound  01/08/2018   : EXCISION OF GROIN WOUND WITH PLACEMENT OF ACELL, AND PRIMARY WOUND CLOSURE (N/A Scrotum)  . INCISION AND DRAINAGE OF WOUND N/A 12/25/2017   Procedure: Irrigation and debridement of Fournier's of scrotum with placement of testes in subcutaneous thigh pockets and Acell placement;  Surgeon: Wallace Going, DO;  Location[facility];  Service: Plastics;  Laterality: N/A;  . INCISION AND DRAINAGE OF WOUND N/A 01/08/2018   Procedure: EXCISION OF GROIN WOUND WITH PLACEMENT OF ACELL, AND PRIMARY WOUND CLOSURE;  Surgeon: Wallace Going, DO;  Location: [facility];  Service: Plastics;  Laterality: N/A;  . ORCHIECTOMY N/A 12/13/2017   Procedure: EXCISION OF SCROTUM AND DEBRIDEMENT OF PENIS;  Surgeon: Franchot Gallo, MD;  Location[facility];  Service: Urology;  Laterality: N/A;  . PLANTAR FASCIA SURGERY Bilateral   . shoulders Bilateral    rotator cuff  . SUBMANDIBULAR GLAND EXCISION Left 03/12/2015   Procedure: LEFT SUBMANDIBULAR GLAND RESECTION;  Surgeon: Melida Quitter, MD;  Location: [facility];  Service: ENT;  Laterality: Left;  . TONSILLECTOMY      Family History  Problem Relation Age of Onset  . Lung cancer Mother   . Bladder Cancer Father    Social History:  reports that he quit smoking about 2 months ago. His smoking use included cigarettes. He has a 50.00 pack-year smoking history. He has never used smokeless tobacco. He reports that he does not drink alcohol or use drugs.  Allergies:  Allergies  Allergen Reactions  . Morphine And Related Itching    Medications Prior to Admission  Medication Sig Dispense Refill  . albuterol (PROVENTIL) (2.5 MG/3ML) 0.083% nebulizer solution Inhale 2.5 mg into the lungs every 6 (  six) hours as needed for shortness of breath or wheezing.    Marland Kitchen atenolol-chlorthalidone (TENORETIC) 50-25 MG tablet Take 1 tablet by mouth every morning.    . diclofenac (VOLTAREN) 75 MG EC tablet Take 75 mg by mouth 2 (two) times daily.    Marland Kitchen docusate sodium (COLACE) 100 MG capsule Take 100 mg by mouth daily.    Marland Kitchen doxazosin (CARDURA) 8 MG tablet Take 8 mg by mouth every morning.    . gabapentin (NEURONTIN) 800 MG tablet Take 800 mg by mouth 4 (four) times daily.   1  . lisinopril (PRINIVIL,ZESTRIL) 20 MG tablet Take 20 mg by mouth daily.    . metFORMIN (GLUCOPHAGE) 500 MG tablet Take 1  tablet (500 mg total) by mouth 2 (two) times daily with a meal. 60 tablet 11  . NYSTATIN powder Apply 1 application topically 2 (two) times daily.  0  . oxyCODONE-acetaminophen (PERCOCET) 10-325 MG tablet Take 1 tablet by mouth every 6 (six) hours as needed for pain.    . potassium chloride SA (K-DUR,KLOR-CON) 20 MEQ tablet Take 40 mEq by mouth daily.   1  . ranitidine (ZANTAC) 150 MG tablet Take 150 mg by mouth 2 (two) times daily.   0  . traZODone (DESYREL) 100 MG tablet Take 100 mg by mouth at bedtime.     . blood glucose meter kit and supplies KIT Dispense based on patient and insurance preference. Use up to four times daily as directed. (FOR ICD-9 250.00, 250.01). 1 each 0  . diphenhydrAMINE (BENADRYL) 12.5 MG/5ML elixir Take 5 mLs (12.5 mg total) by mouth every 6 (six) hours as needed for itching. (Patient not taking: Reported on 03/12/2018) 120 mL 0  . HYDROcodone-acetaminophen (NORCO/VICODIN) 5-325 MG tablet Take 1 tablet by mouth every 4 (four) hours as needed for moderate pain. (Patient not taking: Reported on 03/12/2018) 30 tablet 0  . insulin aspart (NOVOLOG) 100 UNIT/ML injection Inject 0-15 Units into the skin 3 (three) times daily with meals. (Patient taking differently: Inject 0-15 Units into the skin 3 (three) times daily with meals. 7- 100 units o units 101-151 units= 1 units 151-200=2 units 201-251=4 units 251-300= 6 units 301-350 8 units  Greater than 530 10 units Greater than 450 call md) 10 mL 11  . Multiple Vitamin (MULTIVITAMIN WITH MINERALS) TABS tablet Take 1 tablet by mouth daily. (Patient not taking: Reported on 03/12/2018) 30 tablet 0  . polyethylene glycol (MIRALAX / GLYCOLAX) packet Take 17 g by mouth daily as needed for mild constipation. (Patient not taking: Reported on 03/12/2018) 14 each 0  . senna (SENOKOT) 8.6 MG TABS tablet Take 1 tablet (8.6 mg total) by mouth 2 (two) times daily. (Patient not taking: Reported on 03/12/2018) 120 each 0    Results for orders placed  or performed during the hospital encounter of 03/12/18 (from the past 48 hour(s))  Glucose, capillary     Status: Abnormal   Collection Time: 03/12/18  9:23 AM  Result Value Ref Range   Glucose-Capillary 169 (H) 70 - 99 mg/dL  CBC     Status: None   Collection Time: 03/12/18 10:14 AM  Result Value Ref Range   WBC 9.0 4.0 - 10.5 K/uL   RBC 4.79 4.22 - 5.81 MIL/uL   Hemoglobin 13.7 13.0 - 17.0 g/dL   HCT 42.5 39.0 - 52.0 %   MCV 88.7 78.0 - 100.0 fL   MCH 28.6 26.0 - 34.0 pg   MCHC 32.2 30.0 - 36.0 g/dL  RDW 13.2 11.5 - 15.5 %   Platelets 230 150 - 400 K/uL    Comment: Performed at [facility], [place] [address] [place], [place] [address]  Basic metabolic panel     Status: Abnormal   Collection Time: 03/12/18 10:14 AM  Result Value Ref Range   Sodium 138 135 - 145 mmol/L   Potassium 3.9 3.5 - 5.1 mmol/L   Chloride 104 98 - 111 mmol/L    Comment: Please note change in reference range.   CO2 23 22 - 32 mmol/L   Glucose, Bld 152 (H) 70 - 99 mg/dL    Comment: Please note change in reference range.   BUN 37 (H) 8 - 23 mg/dL    Comment: Please note change in reference range.   Creatinine, Ser 1.54 (H) 0.61 - 1.24 mg/dL   Calcium 9.3 8.9 - 10.3 mg/dL   GFR calc non Af Amer 45 (L) >60 mL/min   GFR calc Af Amer 52 (L) >60 mL/min    Comment: (NOTE) The eGFR has been calculated using the CKD EPI equation. This calculation has not been validated in all clinical situations. eGFR's persistently <60 mL/min signify possible Chronic Kidney Disease.    Anion gap 11 5 - 15    Comment: Performed at [facility] [address] [place], [place] [address]   No results found.  Review of Systems  Constitutional: Negative.   HENT: Negative.   Eyes: Negative.   Respiratory: Negative.   Cardiovascular: Negative.   Gastrointestinal: Negative.   Genitourinary: Negative.   Musculoskeletal: Negative.   Skin: Negative.   Psychiatric/Behavioral: Negative.     Blood pressure (!)  150/71, pulse 61, temperature 98.5 F (36.9 C), temperature source Oral, resp. rate 20, height '5\' 8"'  (1.727 m), weight 84.4 kg (186 lb), SpO2 97 %. Physical Exam  Constitutional: He appears well-developed.  HENT:  Head: Normocephalic and atraumatic.  Eyes: Pupils are equal, round, and reactive to light. EOM are normal.  Respiratory: Effort normal. No respiratory distress.  GI: Soft. He exhibits no distension.  Genitourinary:     Musculoskeletal: He exhibits tenderness. He exhibits no edema.  Neurological: He is alert.  Skin: No rash noted. No erythema.  Psychiatric: He has a normal mood and affect. His behavior is normal.     Assessment/Plan Plan for debridement and closure of perineal wound.  [place], DO 03/12/2018, 11:31 AM

## 2018-03-12 NOTE — Progress Notes (Signed)
SPOKE WITH DR. ROSE- PATIENT TOOK TENORETIC YESTERDAY MORNING.  PER DR. ROSE THEY CAN GIVE POSTOP.

## 2018-03-12 NOTE — Discharge Instructions (Signed)

## 2018-03-12 NOTE — Anesthesia Postprocedure Evaluation (Signed)
Anesthesia Post Note  Patient: Patrick Aguilar  Procedure(s) Performed: IRRIGATION AND DEBRIDEMENT PERIMUM WOUND WITH CLOSURE (N/A Perineum)     Patient location during evaluation: PACU Anesthesia Type: General Level of consciousness: awake and alert Pain management: pain level controlled Vital Signs Assessment: post-procedure vital signs reviewed and stable Respiratory status: spontaneous breathing, nonlabored ventilation, respiratory function stable and patient connected to nasal cannula oxygen Cardiovascular status: blood pressure returned to baseline and stable Postop Assessment: no apparent nausea or vomiting Anesthetic complications: no    Last Vitals:  Vitals:   03/12/18 1300 03/12/18 1315  BP: 123/82 123/73  Pulse: 83 78  Resp: 15 17  Temp:    SpO2: 93% 92%    Last Pain:  Vitals:   03/12/18 1300  TempSrc:   PainSc: 0-No pain                 Cieanna Stormes S

## 2018-03-13 ENCOUNTER — Encounter (HOSPITAL_COMMUNITY): Payer: Self-pay | Admitting: Plastic Surgery

## 2018-06-05 ENCOUNTER — Ambulatory Visit: Payer: Medicare HMO | Admitting: Urology

## 2018-06-13 ENCOUNTER — Ambulatory Visit (INDEPENDENT_AMBULATORY_CARE_PROVIDER_SITE_OTHER): Payer: Medicare HMO | Admitting: Plastic Surgery

## 2018-06-13 ENCOUNTER — Encounter: Payer: Self-pay | Admitting: Plastic Surgery

## 2018-06-13 VITALS — BP 136/70 | HR 76 | Resp 12 | Ht 69.0 in | Wt 186.0 lb

## 2018-06-13 DIAGNOSIS — S31104A Unspecified open wound of abdominal wall, left lower quadrant without penetration into peritoneal cavity, initial encounter: Secondary | ICD-10-CM | POA: Diagnosis not present

## 2018-06-13 DIAGNOSIS — N493 Fournier gangrene: Secondary | ICD-10-CM

## 2018-06-13 NOTE — Progress Notes (Signed)
   Subjective:    Patient ID: Patrick Aguilar, male    DOB: 02-27-1951, 67 y.o.   MRN: [id number]  The patient is a 67 yrs old wm here without his daughter as she has oral surgery and is at home.  He had a severe necrotizing fasciitis months ago with surgery and reconstruction.  He opened the area after it was closed several times.  He had Acell placed and then did dressing changes.  He has been very careful with sitting on the area. He also was seen at the wound care center. He has done amazing and is completely healed.     Review of Systems  Constitutional: Negative for activity change and appetite change.  HENT: Negative.   Eyes: Negative.   Respiratory: Negative.   Cardiovascular: Negative.   Musculoskeletal: Negative.   Skin: Negative.   Hematological: Negative.   Psychiatric/Behavioral: Negative.        Objective:   Physical Exam  Constitutional: He is oriented to person, place, and time. He appears well-developed and well-nourished.  HENT:  Head: Normocephalic and atraumatic.  Eyes: Pupils are equal, round, and reactive to light.  Cardiovascular: Normal rate.  Pulmonary/Chest: Effort normal.  Neurological: He is alert and oriented to person, place, and time.  Skin: Skin is warm. No erythema.  Psychiatric: He has a normal mood and affect. His behavior is normal.   Vitals:   06/13/18 1324  BP: 136/70  Pulse: 76  Resp: 12  SpO2: 94%  Weight: 186 lb (84.4 kg)  Height: 5\' 9"  (1.753 m)       Assessment & Plan:  Fournier gangrene  Non-healing left groin open wound, initial encounter  Follow up as needed.  Continue healthy eating and no smoking.  Follow up with Urology for the testicular aspect.  He mentioned waiting on surgery due to fluid.

## 2018-06-19 ENCOUNTER — Encounter (HOSPITAL_BASED_OUTPATIENT_CLINIC_OR_DEPARTMENT_OTHER): Payer: Self-pay

## 2018-06-19 ENCOUNTER — Other Ambulatory Visit: Payer: Self-pay | Admitting: Urology

## 2018-06-20 ENCOUNTER — Encounter (HOSPITAL_BASED_OUTPATIENT_CLINIC_OR_DEPARTMENT_OTHER): Payer: Self-pay

## 2018-06-20 ENCOUNTER — Other Ambulatory Visit: Payer: Self-pay

## 2018-06-20 NOTE — Progress Notes (Signed)
Spoke with:  Leonette Most NPO:  After Midnight, no gum, candy, or mints  Arrival time: 0930AM Labs: Istat 8 (EKG 12/13/2017) AM medications: Doxazosin, Gabapentin, Rantidine, Oxycodone Pre op orders: Yes Ride home: Juliette Alcide (daughter) [phone]

## 2018-06-21 NOTE — H&P (Signed)
H&P  Chief Complaint: Left testicular pain  History of Present Illness: 67 year old male presents for left orchiectomy. He had scrotal excision earlier in 2019 for Fourniere's gangrene. Testicular placement in thigh pouches was performed following that. He has had persistent left testicular pain since that time, based on position. He presents for orchiectomy for management of his pain.  Past Medical History:  Diagnosis Date  . AKI (acute kidney injury) ([facility]) 12/2017  . Anxiety   . Arthritis   . Complication of anesthesia    low respirations, low BP  . COPD (chronic obstructive pulmonary disease) ([facility])   . DDD (degenerative disc disease), lumbar   . Diabetes mellitus without complication ([facility])    type 2  . Diastolic dysfunction 12/14/2017   Moderate noted on ECHO  . Fournier's gangrene in male   . GERD (gastroesophageal reflux disease)   . History of ARDS   . History of necrotizing fasciitis    Severe  . Hypertension   . Lumbar spondylosis   . Numbness and tingling of both upper extremities   . Pulmonary hypertension ([facility]) 12/14/2017   Mild, noted on ECHO  . Respiratory failure, acute ([facility]) 12/2017  . Septic shock ([facility]) 12/2017  . Shortness of breath dyspnea   . Testicular pain, left   . Wears glasses     Past Surgical History:  Procedure Laterality Date  . APPENDECTOMY    . APPLICATION OF A-CELL OF CHEST/ABDOMEN N/A 01/08/2018   Procedure: APPLICATION OF A-CELL OF GROIN;  Surgeon: Peggye Form, DO;  [facility];  Service: Plastics;  Laterality: N/A;  . APPLICATION OF A-CELL OF EXTREMITY N/A 12/25/2017   Procedure: APPLICATION OF A-CELL;  Surgeon: Peggye Form, DO;  [facility];  Service: Plastics;  Laterality: N/A;  . BACK SURGERY     x5  . CARDIAC CATHETERIZATION  06   neg  . CERVICAL DISC SURGERY     x2  . COLONOSCOPY    . DEBRIDEMENT AND CLOSURE WOUND N/A 01/26/2018   Procedure: REVISION OF PERINEUM WOUND WITH DEBRIDEMENT, PARTIAL CLOSURE OF  PERINEUM, PLACEMENT OF CELLERATE COLLAGEN;  Surgeon: Peggye Form, DO;  [facility];  Service: Plastics;  Laterality: N/A;  . DENTAL SURGERY     teeth extractions  . groin wound  01/08/2018   : EXCISION OF GROIN WOUND WITH PLACEMENT OF ACELL, AND PRIMARY WOUND CLOSURE (N/A Scrotum)  . I&D EXTREMITY N/A 03/12/2018   Procedure: IRRIGATION AND DEBRIDEMENT PERIMUM WOUND WITH CLOSURE;  Surgeon: Peggye Form, DO;  [facility];  Service: Plastics;  Laterality: N/A;  . INCISION AND DRAINAGE OF WOUND N/A 12/25/2017   Procedure: Irrigation and debridement of Fournier's of scrotum with placement of testes in subcutaneous thigh pockets and Acell placement;  Surgeon: Peggye Form, DO;  [facility];  Service: Plastics;  Laterality: N/A;  . INCISION AND DRAINAGE OF WOUND N/A 01/08/2018   Procedure: EXCISION OF GROIN WOUND WITH PLACEMENT OF ACELL, AND PRIMARY WOUND CLOSURE;  Surgeon: Peggye Form, DO;  [facility];  Service: Plastics;  Laterality: N/A;  . ORCHIECTOMY N/A 12/13/2017   Procedure: EXCISION OF SCROTUM AND DEBRIDEMENT OF PENIS;  Surgeon: Marcine Matar, MD;  [facility];  Service: Urology;  Laterality: N/A;  . PLANTAR FASCIA SURGERY Bilateral   . shoulders Bilateral    rotator cuff  . SUBMANDIBULAR GLAND EXCISION Left 03/12/2015   Procedure: LEFT SUBMANDIBULAR GLAND RESECTION;  Surgeon: Christia Reading, MD;  Location:  [facility];  Service: ENT;  Laterality: Left;  . TONSILLECTOMY      Home Medications:  Allergies as of 06/21/2018      Reactions   Morphine And Related Itching      Medication List    Notice   Cannot display discharge medications because the patient has not yet been admitted.     Allergies:  Allergies  Allergen Reactions  . Morphine And Related Itching    Family History  Problem Relation Age of Onset  . Lung cancer Mother   . Bladder Cancer Father     Social History:  reports that he quit smoking about 6 months ago.  His smoking use included cigarettes. He has a 50.00 pack-year smoking history. He has never used smokeless tobacco. He reports that he does not drink alcohol or use drugs.  ROS: A complete review of systems was performed.  All systems are negative except for pertinent findings as noted.  Physical Exam:  Vital signs in last 24 hours:   Constitutional:  Alert and oriented, No acute distress Cardiovascular: Regular rate  Respiratory: Normal respiratory effort GI: Abdomen is soft, nontender, nondistended, no abdominal masses Genitourinary: No CVAT. Normal male phallus, testes are descended bilaterally and non-tender and without masses, scrotum is normal in appearance without lesions or masses, perineum is normal on inspection. Lymphatic: No lymphadenopathy Neurologic: Grossly intact, no focal deficits Psychiatric: Normal mood and affect  Laboratory Data:  No results for input(s): WBC, HGB, HCT, PLT in the last 72 hours.  No results for input(s): NA, K, CL, GLUCOSE, BUN, CALCIUM, CREATININE in the last 72 hours.  Invalid input(s): CO3   No results found for this or any previous visit (from the past 24 hour(s)). No results found for this or any previous visit (from the past 240 hour(s)).  Renal Function: No results for input(s): CREATININE in the last 168 hours. CrCl cannot be calculated (Patient's most recent lab result is older than the maximum 21 days allowed.).  Radiologic Imaging: No results found.  Impression/Assessment:  Left testiculer pain  Plan:  Left orchiectomy

## 2018-06-22 ENCOUNTER — Encounter (HOSPITAL_BASED_OUTPATIENT_CLINIC_OR_DEPARTMENT_OTHER): Payer: Self-pay | Admitting: Emergency Medicine

## 2018-06-22 ENCOUNTER — Other Ambulatory Visit: Payer: Self-pay

## 2018-06-22 ENCOUNTER — Encounter (HOSPITAL_BASED_OUTPATIENT_CLINIC_OR_DEPARTMENT_OTHER)
Admission: RE | Disposition: A | Discharge status: Home / Self Care | Payer: Self-pay | Source: Ambulatory Visit | Attending: Urology

## 2018-06-22 ENCOUNTER — Ambulatory Visit (HOSPITAL_BASED_OUTPATIENT_CLINIC_OR_DEPARTMENT_OTHER)
Admission: RE | Admit: 2018-06-22 | Discharge: 2018-06-22 | Disposition: A | Discharge status: Home / Self Care | Payer: Medicare HMO | Source: Ambulatory Visit | Attending: Urology | Admitting: Urology

## 2018-06-22 ENCOUNTER — Ambulatory Visit (HOSPITAL_BASED_OUTPATIENT_CLINIC_OR_DEPARTMENT_OTHER): Payer: Medicare HMO | Admitting: Anesthesiology

## 2018-06-22 DIAGNOSIS — Z79899 Other long term (current) drug therapy: Secondary | ICD-10-CM | POA: Diagnosis not present

## 2018-06-22 DIAGNOSIS — F419 Anxiety disorder, unspecified: Secondary | ICD-10-CM | POA: Diagnosis not present

## 2018-06-22 DIAGNOSIS — I10 Essential (primary) hypertension: Secondary | ICD-10-CM | POA: Diagnosis not present

## 2018-06-22 DIAGNOSIS — K219 Gastro-esophageal reflux disease without esophagitis: Secondary | ICD-10-CM | POA: Insufficient documentation

## 2018-06-22 DIAGNOSIS — M1990 Unspecified osteoarthritis, unspecified site: Secondary | ICD-10-CM | POA: Insufficient documentation

## 2018-06-22 DIAGNOSIS — Z885 Allergy status to narcotic agent status: Secondary | ICD-10-CM | POA: Diagnosis not present

## 2018-06-22 DIAGNOSIS — J449 Chronic obstructive pulmonary disease, unspecified: Secondary | ICD-10-CM | POA: Insufficient documentation

## 2018-06-22 DIAGNOSIS — Z794 Long term (current) use of insulin: Secondary | ICD-10-CM | POA: Insufficient documentation

## 2018-06-22 DIAGNOSIS — Z87891 Personal history of nicotine dependence: Secondary | ICD-10-CM | POA: Insufficient documentation

## 2018-06-22 DIAGNOSIS — I272 Pulmonary hypertension, unspecified: Secondary | ICD-10-CM | POA: Diagnosis not present

## 2018-06-22 DIAGNOSIS — N50812 Left testicular pain: Secondary | ICD-10-CM | POA: Insufficient documentation

## 2018-06-22 DIAGNOSIS — E119 Type 2 diabetes mellitus without complications: Secondary | ICD-10-CM | POA: Diagnosis not present

## 2018-06-22 HISTORY — DX: Left testicular pain: N50.812

## 2018-06-22 HISTORY — DX: Other ill-defined heart diseases: I51.89

## 2018-06-22 HISTORY — DX: Sepsis, unspecified organism: A41.9

## 2018-06-22 HISTORY — DX: Fournier gangrene: N49.3

## 2018-06-22 HISTORY — DX: Spondylosis without myelopathy or radiculopathy, lumbar region: M47.816

## 2018-06-22 HISTORY — DX: Other intervertebral disc degeneration, lumbar region without mention of lumbar back pain or lower extremity pain: M51.369

## 2018-06-22 HISTORY — DX: Acute kidney failure, unspecified: N17.9

## 2018-06-22 HISTORY — DX: Anesthesia of skin: R20.2

## 2018-06-22 HISTORY — PX: ORCHIECTOMY: SHX2116

## 2018-06-22 HISTORY — DX: Anesthesia of skin: R20.0

## 2018-06-22 HISTORY — DX: Other intervertebral disc degeneration, lumbar region: M51.36

## 2018-06-22 HISTORY — DX: Personal history of other diseases of the musculoskeletal system and connective tissue: Z87.39

## 2018-06-22 HISTORY — DX: Pulmonary hypertension, unspecified: I27.20

## 2018-06-22 HISTORY — DX: Severe sepsis with septic shock: R65.21

## 2018-06-22 HISTORY — DX: Presence of spectacles and contact lenses: Z97.3

## 2018-06-22 LAB — POCT I-STAT, CHEM 8
BUN: 17 mg/dL (ref 8–23)
BUN: 17 mg/dL (ref 8–23)
CALCIUM ION: 1.06 mmol/L — AB (ref 1.15–1.40)
CHLORIDE: 99 mmol/L (ref 98–111)
CREATININE: 1 mg/dL (ref 0.61–1.24)
Calcium, Ion: 1.26 mmol/L (ref 1.15–1.40)
Chloride: 98 mmol/L (ref 98–111)
Creatinine, Ser: 1 mg/dL (ref 0.61–1.24)
GLUCOSE: 164 mg/dL — AB (ref 70–99)
GLUCOSE: 165 mg/dL — AB (ref 70–99)
HCT: 40 % (ref 39.0–52.0)
HEMATOCRIT: 42 % (ref 39.0–52.0)
HEMOGLOBIN: 13.6 g/dL (ref 13.0–17.0)
HEMOGLOBIN: 14.3 g/dL (ref 13.0–17.0)
POTASSIUM: 3.4 mmol/L — AB (ref 3.5–5.1)
Potassium: 3.4 mmol/L — ABNORMAL LOW (ref 3.5–5.1)
SODIUM: 136 mmol/L (ref 135–145)
Sodium: 136 mmol/L (ref 135–145)
TCO2: 24 mmol/L (ref 22–32)
TCO2: 25 mmol/L (ref 22–32)

## 2018-06-22 LAB — GLUCOSE, CAPILLARY: Glucose-Capillary: 123 mg/dL — ABNORMAL HIGH (ref 70–99)

## 2018-06-22 SURGERY — ORCHIECTOMY
Anesthesia: General | Site: Thigh | Laterality: Left

## 2018-06-22 MED ORDER — FENTANYL CITRATE (PF) 100 MCG/2ML IJ SOLN
25.0000 ug | INTRAMUSCULAR | Status: DC | PRN
  Administered 2018-06-22 (×2): 50 ug via INTRAVENOUS
  Filled 2018-06-22: qty 1

## 2018-06-22 MED ORDER — CEFAZOLIN SODIUM-DEXTROSE 2-4 GM/100ML-% IV SOLN
2.0000 g | INTRAVENOUS | Status: AC
  Administered 2018-06-22: 2 g via INTRAVENOUS
  Filled 2018-06-22: qty 100

## 2018-06-22 MED ORDER — PHENYLEPHRINE 40 MCG/ML (10ML) SYRINGE FOR IV PUSH (FOR BLOOD PRESSURE SUPPORT)
PREFILLED_SYRINGE | INTRAVENOUS | Status: DC | PRN
  Administered 2018-06-22: 120 ug via INTRAVENOUS

## 2018-06-22 MED ORDER — PHENYLEPHRINE 40 MCG/ML (10ML) SYRINGE FOR IV PUSH (FOR BLOOD PRESSURE SUPPORT)
PREFILLED_SYRINGE | INTRAVENOUS | Status: AC
  Filled 2018-06-22: qty 10

## 2018-06-22 MED ORDER — DEXAMETHASONE SODIUM PHOSPHATE 4 MG/ML IJ SOLN
INTRAMUSCULAR | Status: DC | PRN
  Administered 2018-06-22: 4 mg via INTRAVENOUS

## 2018-06-22 MED ORDER — MEPERIDINE HCL 25 MG/ML IJ SOLN
6.2500 mg | INTRAMUSCULAR | Status: DC | PRN
  Filled 2018-06-22: qty 1

## 2018-06-22 MED ORDER — ARTIFICIAL TEARS OPHTHALMIC OINT
TOPICAL_OINTMENT | OPHTHALMIC | Status: AC
  Filled 2018-06-22: qty 3.5

## 2018-06-22 MED ORDER — MIDAZOLAM HCL 2 MG/2ML IJ SOLN
INTRAMUSCULAR | Status: AC
  Filled 2018-06-22: qty 2

## 2018-06-22 MED ORDER — FENTANYL CITRATE (PF) 100 MCG/2ML IJ SOLN
INTRAMUSCULAR | Status: AC
  Filled 2018-06-22: qty 2

## 2018-06-22 MED ORDER — MIDAZOLAM HCL 5 MG/5ML IJ SOLN
INTRAMUSCULAR | Status: DC | PRN
  Administered 2018-06-22: 2 mg via INTRAVENOUS

## 2018-06-22 MED ORDER — LIDOCAINE 2% (20 MG/ML) 5 ML SYRINGE
INTRAMUSCULAR | Status: AC
  Filled 2018-06-22: qty 5

## 2018-06-22 MED ORDER — LACTATED RINGERS IV SOLN
INTRAVENOUS | Status: DC
  Administered 2018-06-22: 10:00:00 via INTRAVENOUS
  Filled 2018-06-22: qty 1000

## 2018-06-22 MED ORDER — TRAMADOL HCL 50 MG PO TABS: 50.0000 mg | ORAL_TABLET | Freq: Four times a day (QID) | ORAL | 0 refills | Status: DC | PRN

## 2018-06-22 MED ORDER — LIDOCAINE 2% (20 MG/ML) 5 ML SYRINGE
INTRAMUSCULAR | Status: DC | PRN
  Administered 2018-06-22: 100 mg via INTRAVENOUS

## 2018-06-22 MED ORDER — OXYCODONE HCL 5 MG PO TABS
ORAL_TABLET | ORAL | Status: AC
  Filled 2018-06-22: qty 1

## 2018-06-22 MED ORDER — PROPOFOL 10 MG/ML IV BOLUS
INTRAVENOUS | Status: DC | PRN
  Administered 2018-06-22: 100 mg via INTRAVENOUS

## 2018-06-22 MED ORDER — FENTANYL CITRATE (PF) 100 MCG/2ML IJ SOLN
INTRAMUSCULAR | Status: DC | PRN
  Administered 2018-06-22 (×2): 25 ug via INTRAVENOUS
  Administered 2018-06-22: 50 ug via INTRAVENOUS

## 2018-06-22 MED ORDER — SODIUM CHLORIDE 0.9 % IR SOLN
Status: DC | PRN
  Administered 2018-06-22: 500 mL

## 2018-06-22 MED ORDER — BUPIVACAINE HCL (PF) 0.25 % IJ SOLN
INTRAMUSCULAR | Status: DC | PRN
  Administered 2018-06-22: 8 mL

## 2018-06-22 MED ORDER — ONDANSETRON HCL 4 MG/2ML IJ SOLN
INTRAMUSCULAR | Status: AC
  Filled 2018-06-22: qty 2

## 2018-06-22 MED ORDER — CEFAZOLIN SODIUM-DEXTROSE 2-4 GM/100ML-% IV SOLN
INTRAVENOUS | Status: AC
  Filled 2018-06-22: qty 100

## 2018-06-22 MED ORDER — DEXAMETHASONE SODIUM PHOSPHATE 10 MG/ML IJ SOLN
INTRAMUSCULAR | Status: AC
  Filled 2018-06-22: qty 1

## 2018-06-22 MED ORDER — ONDANSETRON HCL 4 MG/2ML IJ SOLN
INTRAMUSCULAR | Status: DC | PRN
  Administered 2018-06-22: 4 mg via INTRAVENOUS

## 2018-06-22 MED ORDER — PROPOFOL 10 MG/ML IV BOLUS
INTRAVENOUS | Status: AC
  Filled 2018-06-22: qty 20

## 2018-06-22 MED ORDER — OXYCODONE HCL 5 MG PO TABS
5.0000 mg | ORAL_TABLET | Freq: Once | ORAL | Status: AC
  Administered 2018-06-22: 5 mg via ORAL
  Filled 2018-06-22: qty 1

## 2018-06-22 SURGICAL SUPPLY — 48 items
ADH SKN CLS APL DERMABOND .7 (GAUZE/BANDAGES/DRESSINGS) ×1
BLADE CLIPPER SURG (BLADE) ×2 IMPLANT
BLADE SURG 15 STRL LF DISP TIS (BLADE) ×1 IMPLANT
BLADE SURG 15 STRL SS (BLADE) ×3
BNDG GAUZE ELAST 4 BULKY (GAUZE/BANDAGES/DRESSINGS) ×3 IMPLANT
COVER BACK TABLE 60X90IN (DRAPES) ×3 IMPLANT
COVER MAYO STAND STRL (DRAPES) ×3 IMPLANT
DERMABOND ADVANCED (GAUZE/BANDAGES/DRESSINGS) ×2
DERMABOND ADVANCED .7 DNX12 (GAUZE/BANDAGES/DRESSINGS) ×1 IMPLANT
DISSECTOR ROUND CHERRY 3/8 STR (MISCELLANEOUS) ×2 IMPLANT
DRAPE LAPAROTOMY 100X72 PEDS (DRAPES) ×3 IMPLANT
ELECT REM PT RETURN 9FT ADLT (ELECTROSURGICAL) ×3
ELECTRODE REM PT RTRN 9FT ADLT (ELECTROSURGICAL) ×1 IMPLANT
GAUZE SPONGE 4X4 12PLY STRL LF (GAUZE/BANDAGES/DRESSINGS) ×2 IMPLANT
GLOVE BIO SURGEON STRL SZ8 (GLOVE) ×3 IMPLANT
GLOVE BIOGEL PI IND STRL 6.5 (GLOVE) IMPLANT
GLOVE BIOGEL PI IND STRL 7.5 (GLOVE) IMPLANT
GLOVE BIOGEL PI INDICATOR 6.5 (GLOVE) ×2
GLOVE BIOGEL PI INDICATOR 7.5 (GLOVE) ×2
GLOVE EUDERMIC 6.5 POWDERFREE (GLOVE) ×2 IMPLANT
GOWN STRL REUS W/TWL XL LVL3 (GOWN DISPOSABLE) ×5 IMPLANT
KIT TURNOVER CYSTO (KITS) ×3 IMPLANT
NDL HYPO 25X1 1.5 SAFETY (NEEDLE) ×1 IMPLANT
NEEDLE HYPO 25X1 1.5 SAFETY (NEEDLE) ×3 IMPLANT
NS IRRIG 500ML POUR BTL (IV SOLUTION) ×3 IMPLANT
PACK BASIN DAY SURGERY FS (CUSTOM PROCEDURE TRAY) ×3 IMPLANT
PENCIL BUTTON HOLSTER BLD 10FT (ELECTRODE) ×3 IMPLANT
SET COLLECT BLD 21X.75 12 PB G (NEEDLE) IMPLANT
SUPPORT SCROTAL LG STRP (MISCELLANEOUS) ×2 IMPLANT
SUPPORT SCROTAL MED ADLT STRP (MISCELLANEOUS) IMPLANT
SUPPORTER ATHLETIC LG (MISCELLANEOUS) ×1
SUPPORTER ATHLETIC MED (MISCELLANEOUS)
SUT MNCRL AB 4-0 PS2 18 (SUTURE) ×3 IMPLANT
SUT PROLENE 4 0 RB 1 (SUTURE)
SUT PROLENE 4-0 RB1 .5 CRCL 36 (SUTURE) IMPLANT
SUT SILK 0 TIES 10X30 (SUTURE) ×3 IMPLANT
SUT VIC AB 3-0 SH 27 (SUTURE) ×3
SUT VIC AB 3-0 SH 27X BRD (SUTURE) ×1 IMPLANT
SUT VIC AB 4-0 SH 27 (SUTURE)
SUT VIC AB 4-0 SH 27XANBCTRL (SUTURE) IMPLANT
SYR BULB IRRIGATION 50ML (SYRINGE) ×2 IMPLANT
SYR CONTROL 10ML LL (SYRINGE) ×3 IMPLANT
TOWEL OR 17X24 6PK STRL BLUE (TOWEL DISPOSABLE) ×6 IMPLANT
TRAY DSU PREP LF (CUSTOM PROCEDURE TRAY) ×3 IMPLANT
TUBE CONNECTING 12'X1/4 (SUCTIONS) ×1
TUBE CONNECTING 12X1/4 (SUCTIONS) ×2 IMPLANT
WATER STERILE IRR 500ML POUR (IV SOLUTION) IMPLANT
YANKAUER SUCT BULB TIP NO VENT (SUCTIONS) ×3 IMPLANT

## 2018-06-22 NOTE — Discharge Instructions (Signed)
HOME CARE INSTRUCTIONS FOR [facility] PROCEDURES ° °Wound Care & Hygiene: °You may apply an ice bag to the scrotum for the first 24 hours.  This may help decrease swelling and soreness.  You may have a dressing held in place by an athletic supporter.  You may remove the dressing in 24 hours and shower in 48 hours.  Continue to use the athletic supporter or tight briefs for at least a week. °Activity: °Rest today - not necessarily flat bed rest.  Just take it easy.  You should not do strenuous activities until your follow-up visit with your doctor.  You may resume light activity in 48 hours. ° °Return to Work: ° °Your doctor will advise you of this depending on the type of work you do ° °Diet: °Drink liquids or eat a light diet this evening.  You may resume a regular diet tomorrow. ° °General Expectations: °You may have a small amount of bleeding.  The scrotum may be swollen or bruised for about a week. ° °Call your Doctor if these occur: ° -persistent or heavy bleeding ° -temperature of 101 degrees or more ° -severe pain, not relieved by your pain medication ° ° °Post Anesthesia Home Care Instructions ° °Activity: °Get plenty of rest for the remainder of the day. A responsible individual must stay with you for 24 hours following the procedure.  °For the next 24 hours, DO NOT: °-Drive a car °-Operate machinery °-Drink alcoholic beverages °-Take any medication unless instructed by your physician °-Make any legal decisions or sign important papers. ° °Meals: °Start with liquid foods such as gelatin or soup. Progress to regular foods as tolerated. Avoid greasy, spicy, heavy foods. If nausea and/or vomiting occur, drink only clear liquids until the nausea and/or vomiting subsides. Call your physician if vomiting continues. ° °Special Instructions/Symptoms: °Your throat may feel dry or sore from the anesthesia or the breathing tube placed in your throat during surgery. If this causes discomfort, gargle with warm salt water.  The discomfort should disappear within 24 hours. ° °If you had a scopolamine patch placed behind your ear for the management of post- operative nausea and/or vomiting: ° °1. The medication in the patch is effective for 72 hours, after which it should be removed.  Wrap patch in a tissue and discard in the trash. Wash hands thoroughly with soap and water. °2. You may remove the patch earlier than 72 hours if you experience unpleasant side effects which may include dry mouth, dizziness or visual disturbances. °3. Avoid touching the patch. Wash your hands with soap and water after contact with the patch. °  ° ° ° ° °

## 2018-06-22 NOTE — Anesthesia Procedure Notes (Signed)
Procedure Name: LMA Insertion Date/Time: 06/22/2018 11:54 AM Performed by: Bethena Midget, MD Pre-anesthesia Checklist: Patient identified, Emergency Drugs available, Suction available and Patient being monitored Patient Re-evaluated:Patient Re-evaluated prior to induction Oxygen Delivery Method: Circle system utilized Preoxygenation: Pre-oxygenation with 100% oxygen Induction Type: IV induction Ventilation: Mask ventilation without difficulty LMA: LMA inserted LMA Size: 4.0 Number of attempts: 1 Airway Equipment and Method: Bite block Placement Confirmation: positive ETCO2 Tube secured with: Tape Dental Injury: Teeth and Oropharynx as per pre-operative assessment

## 2018-06-22 NOTE — Anesthesia Postprocedure Evaluation (Signed)
Anesthesia Post Note  Patient: Patrick Aguilar  Procedure(s) Performed: ORCHIECTOMY (Left Thigh)     Patient location during evaluation: PACU Anesthesia Type: General Level of consciousness: awake and alert Pain management: pain level controlled Vital Signs Assessment: post-procedure vital signs reviewed and stable Respiratory status: spontaneous breathing, nonlabored ventilation, respiratory function stable and patient connected to nasal cannula oxygen Cardiovascular status: blood pressure returned to baseline and stable Postop Assessment: no apparent nausea or vomiting Anesthetic complications: no    Last Vitals:  Vitals:   06/22/18 1315 06/22/18 1330  BP: (!) 142/109 (!) 151/99  Pulse: 67 68  Resp: 18 14  Temp:    SpO2: 95% 94%    Last Pain:  Vitals:   06/22/18 1330  TempSrc:   PainSc: 8                  Rowland Ericsson

## 2018-06-22 NOTE — Transfer of Care (Signed)
   Last Vitals:  Vitals Value Taken Time  BP 121/79 06/22/2018 12:41 PM  Temp    Pulse 72 06/22/2018 12:45 PM  Resp 15 06/22/2018 12:45 PM  SpO2 90 % 06/22/2018 12:45 PM  Vitals shown include unvalidated device data.  Last Pain:  Vitals:   06/22/18 0916  TempSrc: Oral  PainSc: 10-Worst pain ever        Immediate Anesthesia Transfer of Care Note  Patient: Patrick Aguilar  Procedure(s) Performed: Procedure(s) (LRB): ORCHIECTOMY (Left)  Patient Location: PACU  Anesthesia Type: General  Level of Consciousness: awake, alert  and oriented  Airway & Oxygen Therapy: Patient Spontanous Breathing and Patient connected to nasal cannula oxygen  Post-op Assessment: Report given to PACU RN and Post -op Vital signs reviewed and stable  Post vital signs: Reviewed and stable  Complications: No apparent anesthesia complications

## 2018-06-22 NOTE — Op Note (Signed)
Preoperative diagnosis: Painful ectopic left testicle  Postoperative diagnosis: Same  Principal procedure: Left orchiectomy through thigh approach  Surgeon: Agustina Witzke  Anesthesia: General with LMA  Complications: None  Specimen: Left testicle to pathology  Estimated blood loss: Less than 10 mL  Indications: 67 year old male with history of Fournier's gangrene.  He had scrotal excision and placement of testicles in his thighs earlier this year.  The patient has had persistent pain associated with his left testicle, mainly with sitting.  This is positional.  At this point, this 67 year old male desires to have his testicle removed to manage this recurrent positional pain.  I have discussed the procedure with the patient and his daughter, including the fact that this is an ectopic testicle and this may complicate the procedure i.e. increased risk of bleeding and that this may not relieve the patient's pain.  They understand these and desire to proceed.  Description of procedure: The patient was properly identified and marked in the holding area.  Preoperative IV antibiotics were administered.  He was taken to the operating room where general anesthetic was administered with the LMA.  He is placed in a frog-leg position.  The patient's penis, perineum and left thigh were prepped and draped.  Proper timeout was performed.  The testicle was easily palpable within his subcutaneous tissue.  I created a 2-1/2 cm incision over top of this.  Dissection was carried down through subcutaneous fat with electrocautery.  It was easy to identify the tunica of albuginea.  Dissection was carried down circumferentially around this with combined sharp and blunt dissection.  It was difficult to distinguish the exact location of the cord due to adhesions within the subcutaneous tissue.  However, 2 packets of tissue that were possible cord structures were identified.  Both of these were clamped with the Kelly clamp.  The  tissue was then ligated proximal to the clamps, and then divided distal.  Specimen was then passed from the table.  Ligation was performed doubly with 0 silk ties.  Both packets of tissue were infiltrated with quarter percent plain Marcaine.  Additionally, Marcaine was used to infiltrate subcutaneous/skin structures.  Hemostasis was excellent with both packets that were tied off.  I then closed subcutaneous tissue with a running 3-0 Vicryl.  Skin edges were reapproximated with a running subcuticular suture of 4-0 Monocryl.  Dermabond was placed.  Fluffs and a jockstrap were also placed.  At this point, the procedure was terminated.  The patient was awakened and taken to the PACU.  He tolerated the procedure well.

## 2018-06-22 NOTE — Anesthesia Preprocedure Evaluation (Signed)
Anesthesia Evaluation  Patient identified by MRN, date of birth, ID band Patient awake    Reviewed: Allergy & Precautions, NPO status , Patient's Chart, lab work & pertinent test results  Airway Mallampati: II  TM Distance: >3 FB Neck ROM: Full    Dental no notable dental hx.    Pulmonary COPD, former smoker,    Pulmonary exam normal breath sounds clear to auscultation       Cardiovascular hypertension, Normal cardiovascular exam Rhythm:Regular Rate:Normal     Neuro/Psych negative neurological ROS  negative psych ROS   GI/Hepatic Neg liver ROS, GERD  ,  Endo/Other  diabetes, Insulin Dependent  Renal/GU negative Renal ROS  negative genitourinary   Musculoskeletal negative musculoskeletal ROS (+)   Abdominal   Peds negative pediatric ROS (+)  Hematology negative hematology ROS (+)   Anesthesia Other Findings   Reproductive/Obstetrics negative OB ROS                             Anesthesia Physical  Anesthesia Plan  ASA: III  Anesthesia Plan: General   Post-op Pain Management:    Induction: Intravenous  PONV Risk Score and Plan: 2 and Ondansetron and Treatment may vary due to age or medical condition  Airway Management Planned: LMA and Oral ETT  Additional Equipment:   Intra-op Plan:   Post-operative Plan: Extubation in OR  Informed Consent: I have reviewed the patients History and Physical, chart, labs and discussed the procedure including the risks, benefits and alternatives for the proposed anesthesia with the patient or authorized representative who has indicated his/her understanding and acceptance.   Dental advisory given  Plan Discussed with: CRNA, Surgeon and Anesthesiologist  Anesthesia Plan Comments:         Anesthesia Quick Evaluation

## 2018-06-22 NOTE — Interval H&P Note (Signed)
History and Physical Interval Note:  06/22/2018 11:38 AM  Patrick Aguilar  has presented today for surgery, with the diagnosis of LEFT TESTICULAR PAIN  The various methods of treatment have been discussed with the patient and family. After consideration of risks, benefits and other options for treatment, the patient has consented to  Procedure(s) with comments: ORCHIECTOMY (Left) - 45 MINS as a surgical intervention .  The patient's history has been reviewed, patient examined, no change in status, stable for surgery.  I have reviewed the patient's chart and labs.  Questions were answered to the patient's satisfaction.     Bertram Millard Shiana Rappleye

## 2018-06-25 ENCOUNTER — Encounter (HOSPITAL_BASED_OUTPATIENT_CLINIC_OR_DEPARTMENT_OTHER): Payer: Self-pay | Admitting: Urology

## 2018-07-08 ENCOUNTER — Other Ambulatory Visit: Payer: Self-pay

## 2018-07-08 ENCOUNTER — Encounter (HOSPITAL_COMMUNITY): Payer: Self-pay | Admitting: *Deleted

## 2018-07-08 ENCOUNTER — Inpatient Hospital Stay (HOSPITAL_COMMUNITY)
Admission: EM | Admit: 2018-07-08 | Discharge: 2018-07-12 | DRG: 603 | Disposition: A | Discharge status: Home / Self Care | Payer: Medicare HMO | Attending: Urology | Admitting: Urology

## 2018-07-08 DIAGNOSIS — Z885 Allergy status to narcotic agent status: Secondary | ICD-10-CM

## 2018-07-08 DIAGNOSIS — E119 Type 2 diabetes mellitus without complications: Secondary | ICD-10-CM | POA: Diagnosis not present

## 2018-07-08 DIAGNOSIS — Z79891 Long term (current) use of opiate analgesic: Secondary | ICD-10-CM | POA: Diagnosis not present

## 2018-07-08 DIAGNOSIS — J449 Chronic obstructive pulmonary disease, unspecified: Secondary | ICD-10-CM | POA: Diagnosis present

## 2018-07-08 DIAGNOSIS — R739 Hyperglycemia, unspecified: Secondary | ICD-10-CM

## 2018-07-08 DIAGNOSIS — R300 Dysuria: Secondary | ICD-10-CM | POA: Diagnosis present

## 2018-07-08 DIAGNOSIS — R350 Frequency of micturition: Secondary | ICD-10-CM | POA: Diagnosis present

## 2018-07-08 DIAGNOSIS — L02416 Cutaneous abscess of left lower limb: Secondary | ICD-10-CM | POA: Diagnosis present

## 2018-07-08 DIAGNOSIS — Z7984 Long term (current) use of oral hypoglycemic drugs: Secondary | ICD-10-CM | POA: Diagnosis not present

## 2018-07-08 DIAGNOSIS — K219 Gastro-esophageal reflux disease without esophagitis: Secondary | ICD-10-CM | POA: Diagnosis present

## 2018-07-08 DIAGNOSIS — Z87891 Personal history of nicotine dependence: Secondary | ICD-10-CM

## 2018-07-08 DIAGNOSIS — Z79899 Other long term (current) drug therapy: Secondary | ICD-10-CM | POA: Diagnosis not present

## 2018-07-08 DIAGNOSIS — M1990 Unspecified osteoarthritis, unspecified site: Secondary | ICD-10-CM | POA: Diagnosis present

## 2018-07-08 DIAGNOSIS — F419 Anxiety disorder, unspecified: Secondary | ICD-10-CM | POA: Diagnosis present

## 2018-07-08 DIAGNOSIS — E1165 Type 2 diabetes mellitus with hyperglycemia: Secondary | ICD-10-CM | POA: Diagnosis present

## 2018-07-08 DIAGNOSIS — L03116 Cellulitis of left lower limb: Secondary | ICD-10-CM | POA: Diagnosis present

## 2018-07-08 DIAGNOSIS — I10 Essential (primary) hypertension: Secondary | ICD-10-CM | POA: Diagnosis present

## 2018-07-08 DIAGNOSIS — N493 Fournier gangrene: Secondary | ICD-10-CM | POA: Diagnosis present

## 2018-07-08 LAB — BASIC METABOLIC PANEL
Anion gap: 9 (ref 5–15)
BUN: 26 mg/dL — ABNORMAL HIGH (ref 8–23)
CALCIUM: 9 mg/dL (ref 8.9–10.3)
CO2: 24 mmol/L (ref 22–32)
CREATININE: 1.21 mg/dL (ref 0.61–1.24)
Chloride: 100 mmol/L (ref 98–111)
GFR calc non Af Amer: >60 mL/min (ref >60)
Glucose, Bld: 219 mg/dL — ABNORMAL HIGH (ref 70–99)
Potassium: 3.8 mmol/L (ref 3.5–5.1)
SODIUM: 133 mmol/L — AB (ref 135–145)

## 2018-07-08 LAB — URINALYSIS, ROUTINE W REFLEX MICROSCOPIC
Bilirubin Urine: NEGATIVE
GLUCOSE, UA: NEGATIVE mg/dL
HGB URINE DIPSTICK: NEGATIVE
Ketones, ur: 5 mg/dL — AB
LEUKOCYTES UA: NEGATIVE
Nitrite: NEGATIVE
PH: 6 (ref 5.0–8.0)
PROTEIN: NEGATIVE mg/dL
Specific Gravity, Urine: 1.015 (ref 1.005–1.030)

## 2018-07-08 LAB — CBC WITH DIFFERENTIAL/PLATELET
Abs Immature Granulocytes: 0.05 10*3/uL (ref 0.00–0.07)
BASOS ABS: 0 10*3/uL (ref 0.0–0.1)
Basophils Relative: 0 %
EOS ABS: 0.1 10*3/uL (ref 0.0–0.5)
EOS PCT: 1 %
HEMATOCRIT: 39.3 % (ref 39.0–52.0)
Hemoglobin: 13 g/dL (ref 13.0–17.0)
Immature Granulocytes: 0 %
LYMPHS ABS: 1.6 10*3/uL (ref 0.7–4.0)
Lymphocytes Relative: 14 %
MCH: 28.8 pg (ref 26.0–34.0)
MCHC: 33.1 g/dL (ref 30.0–36.0)
MCV: 87.1 fL (ref 80.0–100.0)
MONO ABS: 1.1 10*3/uL — AB (ref 0.1–1.0)
Monocytes Relative: 10 %
NRBC: 0 % (ref 0.0–0.2)
Neutro Abs: 8.7 10*3/uL — ABNORMAL HIGH (ref 1.7–7.7)
Neutrophils Relative %: 75 %
Platelets: 237 10*3/uL (ref 150–400)
RBC: 4.51 MIL/uL (ref 4.22–5.81)
RDW: 14.5 % (ref 11.5–15.5)
WBC: 11.7 10*3/uL — ABNORMAL HIGH (ref 4.0–10.5)

## 2018-07-08 LAB — GLUCOSE, CAPILLARY
GLUCOSE-CAPILLARY: 116 mg/dL — AB (ref 70–99)
GLUCOSE-CAPILLARY: 116 mg/dL — AB (ref 70–99)
Glucose-Capillary: 202 mg/dL — ABNORMAL HIGH (ref 70–99)

## 2018-07-08 LAB — I-STAT CG4 LACTIC ACID, ED
Lactic Acid, Venous: 2.52 mmol/L (ref 0.5–1.9)
Lactic Acid, Venous: 1.5 mmol/L (ref 0.5–1.9)

## 2018-07-08 MED ORDER — GABAPENTIN 800 MG PO TABS
800.0000 mg | ORAL_TABLET | Freq: Four times a day (QID) | ORAL | Status: DC
  Filled 2018-07-08: qty 1

## 2018-07-08 MED ORDER — VANCOMYCIN HCL IN DEXTROSE 1-5 GM/200ML-% IV SOLN
1000.0000 mg | Freq: Once | INTRAVENOUS | Status: AC
  Administered 2018-07-08: 1000 mg via INTRAVENOUS
  Filled 2018-07-08: qty 200

## 2018-07-08 MED ORDER — OXYCODONE-ACETAMINOPHEN 5-325 MG PO TABS
1.0000 | ORAL_TABLET | Freq: Four times a day (QID) | ORAL | Status: DC
  Administered 2018-07-08 – 2018-07-12 (×14): 1 via ORAL
  Filled 2018-07-08 (×14): qty 1

## 2018-07-08 MED ORDER — ONDANSETRON HCL 4 MG/2ML IJ SOLN
4.0000 mg | INTRAMUSCULAR | Status: DC | PRN
  Administered 2018-07-10: 4 mg via INTRAVENOUS
  Filled 2018-07-08: qty 2

## 2018-07-08 MED ORDER — INSULIN ASPART 100 UNIT/ML ~~LOC~~ SOLN: 0.0000 [IU] | Freq: Every day | SUBCUTANEOUS | Status: DC

## 2018-07-08 MED ORDER — DOXAZOSIN MESYLATE 4 MG PO TABS
8.0000 mg | ORAL_TABLET | Freq: Every day | ORAL | Status: DC
  Administered 2018-07-09 – 2018-07-12 (×4): 8 mg via ORAL
  Filled 2018-07-08 (×4): qty 2

## 2018-07-08 MED ORDER — CLINDAMYCIN PHOSPHATE 600 MG/50ML IV SOLN
600.0000 mg | Freq: Three times a day (TID) | INTRAVENOUS | Status: DC
  Administered 2018-07-08 – 2018-07-12 (×11): 600 mg via INTRAVENOUS
  Filled 2018-07-08 (×12): qty 50

## 2018-07-08 MED ORDER — PIPERACILLIN-TAZOBACTAM 3.375 G IVPB 30 MIN
3.3750 g | Freq: Once | INTRAVENOUS | Status: AC
  Administered 2018-07-08: 3.375 g via INTRAVENOUS
  Filled 2018-07-08: qty 50

## 2018-07-08 MED ORDER — POTASSIUM CHLORIDE IN NACL 20-0.45 MEQ/L-% IV SOLN
INTRAVENOUS | Status: DC
  Administered 2018-07-08 – 2018-07-09 (×2): via INTRAVENOUS
  Filled 2018-07-08 (×2): qty 1000

## 2018-07-08 MED ORDER — FAMOTIDINE 20 MG PO TABS
20.0000 mg | ORAL_TABLET | Freq: Every day | ORAL | Status: DC
  Administered 2018-07-09 – 2018-07-12 (×4): 20 mg via ORAL
  Filled 2018-07-08 (×4): qty 1

## 2018-07-08 MED ORDER — VANCOMYCIN HCL IN DEXTROSE 1-5 GM/200ML-% IV SOLN: 1000.0000 mg | Freq: Once | INTRAVENOUS | Status: DC

## 2018-07-08 MED ORDER — INSULIN ASPART 100 UNIT/ML ~~LOC~~ SOLN
3.0000 [IU] | Freq: Three times a day (TID) | SUBCUTANEOUS | Status: DC
  Administered 2018-07-09 – 2018-07-12 (×6): 3 [IU] via SUBCUTANEOUS

## 2018-07-08 MED ORDER — FLEET ENEMA 7-19 GM/118ML RE ENEM: 1.0000 | ENEMA | Freq: Once | RECTAL | Status: DC | PRN

## 2018-07-08 MED ORDER — SODIUM CHLORIDE 0.9 % IV SOLN
2.0000 g | Freq: Two times a day (BID) | INTRAVENOUS | Status: DC
  Administered 2018-07-08 – 2018-07-12 (×7): 2 g via INTRAVENOUS
  Filled 2018-07-08 (×8): qty 2

## 2018-07-08 MED ORDER — CLINDAMYCIN PHOSPHATE 600 MG/50ML IV SOLN
600.0000 mg | Freq: Once | INTRAVENOUS | Status: DC
  Administered 2018-07-08: 600 mg via INTRAVENOUS
  Filled 2018-07-08: qty 50

## 2018-07-08 MED ORDER — SENNOSIDES-DOCUSATE SODIUM 8.6-50 MG PO TABS: 1.0000 | ORAL_TABLET | Freq: Every evening | ORAL | Status: DC | PRN

## 2018-07-08 MED ORDER — ALBUTEROL SULFATE (2.5 MG/3ML) 0.083% IN NEBU: 2.5000 mg | INHALATION_SOLUTION | Freq: Four times a day (QID) | RESPIRATORY_TRACT | Status: DC | PRN

## 2018-07-08 MED ORDER — GABAPENTIN 400 MG PO CAPS
800.0000 mg | ORAL_CAPSULE | Freq: Four times a day (QID) | ORAL | Status: DC
  Administered 2018-07-08 – 2018-07-12 (×14): 800 mg via ORAL
  Filled 2018-07-08 (×14): qty 2

## 2018-07-08 MED ORDER — POLYETHYLENE GLYCOL 3350 17 G PO PACK
17.0000 g | PACK | Freq: Every day | ORAL | Status: DC | PRN
  Filled 2018-07-08: qty 1

## 2018-07-08 MED ORDER — SODIUM CHLORIDE 0.9 % IV BOLUS
500.0000 mL | Freq: Once | INTRAVENOUS | Status: AC
  Administered 2018-07-08: 500 mL via INTRAVENOUS

## 2018-07-08 MED ORDER — BISACODYL 10 MG RE SUPP: 10.0000 mg | Freq: Every day | RECTAL | Status: DC | PRN

## 2018-07-08 MED ORDER — INSULIN ASPART 100 UNIT/ML ~~LOC~~ SOLN
0.0000 [IU] | Freq: Three times a day (TID) | SUBCUTANEOUS | Status: DC
  Administered 2018-07-09: 2 [IU] via SUBCUTANEOUS
  Administered 2018-07-10: 5 [IU] via SUBCUTANEOUS
  Administered 2018-07-10 – 2018-07-11 (×3): 2 [IU] via SUBCUTANEOUS
  Administered 2018-07-11: 5 [IU] via SUBCUTANEOUS

## 2018-07-08 MED ORDER — INSULIN ASPART 100 UNIT/ML ~~LOC~~ SOLN: 0.0000 [IU] | Freq: Three times a day (TID) | SUBCUTANEOUS | Status: DC

## 2018-07-08 MED ORDER — CHLORTHALIDONE 25 MG PO TABS
25.0000 mg | ORAL_TABLET | Freq: Every day | ORAL | Status: DC
  Filled 2018-07-08: qty 1

## 2018-07-08 MED ORDER — POLYETHYLENE GLYCOL 3350 17 G PO PACK
17.0000 g | PACK | Freq: Every day | ORAL | Status: DC
  Administered 2018-07-09: 17 g via ORAL
  Filled 2018-07-08: qty 1

## 2018-07-08 MED ORDER — HYDROMORPHONE HCL 1 MG/ML IJ SOLN
0.5000 mg | INTRAMUSCULAR | Status: DC | PRN
  Administered 2018-07-10: 1 mg via INTRAVENOUS
  Administered 2018-07-12: 0.5 mg via INTRAVENOUS
  Filled 2018-07-08 (×2): qty 1

## 2018-07-08 MED ORDER — ATENOLOL 50 MG PO TABS: 50.0000 mg | ORAL_TABLET | Freq: Every day | ORAL | Status: DC

## 2018-07-08 MED ORDER — ATENOLOL-CHLORTHALIDONE 50-25 MG PO TABS: 1.0000 | ORAL_TABLET | ORAL | Status: DC

## 2018-07-08 MED ORDER — VANCOMYCIN HCL 10 G IV SOLR
1250.0000 mg | INTRAVENOUS | Status: DC
  Administered 2018-07-09 – 2018-07-10 (×2): 1250 mg via INTRAVENOUS
  Filled 2018-07-08 (×2): qty 1250

## 2018-07-08 MED ORDER — SODIUM CHLORIDE 0.9 % IV BOLUS
1000.0000 mL | Freq: Once | INTRAVENOUS | Status: AC
  Administered 2018-07-08: 1000 mL via INTRAVENOUS

## 2018-07-08 MED ORDER — DICLOFENAC SODIUM 75 MG PO TBEC
75.0000 mg | DELAYED_RELEASE_TABLET | Freq: Two times a day (BID) | ORAL | Status: DC
  Administered 2018-07-08 – 2018-07-12 (×8): 75 mg via ORAL
  Filled 2018-07-08 (×9): qty 1

## 2018-07-08 MED ORDER — OXYCODONE HCL 5 MG PO TABS
5.0000 mg | ORAL_TABLET | Freq: Four times a day (QID) | ORAL | Status: DC
  Administered 2018-07-08 – 2018-07-12 (×14): 5 mg via ORAL
  Filled 2018-07-08 (×14): qty 1

## 2018-07-08 MED ORDER — OXYCODONE-ACETAMINOPHEN 10-325 MG PO TABS: 1.0000 | ORAL_TABLET | Freq: Four times a day (QID) | ORAL | Status: DC

## 2018-07-08 MED ORDER — ATENOLOL 50 MG PO TABS
50.0000 mg | ORAL_TABLET | Freq: Every day | ORAL | Status: DC
  Filled 2018-07-08: qty 1

## 2018-07-08 MED ORDER — TRAZODONE HCL 100 MG PO TABS
100.0000 mg | ORAL_TABLET | Freq: Every day | ORAL | Status: DC
  Administered 2018-07-08 – 2018-07-11 (×4): 100 mg via ORAL
  Filled 2018-07-08 (×4): qty 1

## 2018-07-08 NOTE — ED Notes (Signed)
ED TO INPATIENT HANDOFF REPORT  Name/Age/Gender Patrick Aguilar 67 y.o. male  Code Status Code Status History    Date Active Date Inactive Code Status Order ID Comments User Context   01/08/2018 1105 01/15/2018 1719 Full Code [id number]  Wallace Going, DO Inpatient   12/13/2017 2259 12/28/2017 1605 Full Code [id number]  Reyne Dumas, MD Inpatient   03/12/2015 1532 03/13/2015 1425 Full Code [id number]  Melida Quitter, MD Inpatient      Home/SNF/Other Home  Chief Complaint wound check; chills  Level of Care/Admitting Diagnosis ED Disposition    ED Disposition Condition [place]: [facility] [[id number]]  Level of Care: Med-Surg [16]  Diagnosis: Cellulitis of left leg [828003]  Admitting Physician: Irine Seal [[id number]]  Attending Physician: Franchot Gallo [[id number]]  PT Class (Do Not Modify): Observation [104]  PT Acc Code (Do Not Modify): Observation [10022]       Medical History Past Medical History:  Diagnosis Date  . AKI (acute kidney injury) ([place]) 12/2017  . Anxiety   . Arthritis   . Complication of anesthesia    low respirations, low BP  . COPD (chronic obstructive pulmonary disease) ([place])   . DDD (degenerative disc disease), lumbar   . Diabetes mellitus without complication ([place])    type 2  . Diastolic dysfunction 49/17/9150   Moderate noted on ECHO  . Fournier's gangrene in male   . GERD (gastroesophageal reflux disease)   . History of ARDS   . History of necrotizing fasciitis    Severe  . Hypertension   . Lumbar spondylosis   . Numbness and tingling of both upper extremities   . Pulmonary hypertension ([place]) 12/14/2017   Mild, noted on ECHO  . Respiratory failure, acute ([place]) 12/2017  . Septic shock ([place]) 12/2017  . Shortness of breath dyspnea   . Testicular pain, left   . Wears glasses     Allergies Allergies  Allergen Reactions  . Morphine And Related Itching    IV Location/Drains/Wounds Patient  Lines/Drains/Airways Status   Active Line/Drains/Airways    Name:   Placement date:   Placement time:   Site:   Days:   Peripheral IV 07/08/18 Left Forearm   07/08/18    1527    Forearm   less than 1   Incision (Closed) 12/13/17 Perineum   12/13/17    2111     207   Incision (Closed) 12/25/17 Perineum   12/25/17    0845     195   Incision (Closed) 01/08/18 Groin Other (Comment)   01/08/18    0827     181   Incision (Closed) 01/26/18 Perineum Other (Comment)   01/26/18    0805     163   Incision (Closed) 03/12/18 Perineum Other (Comment)   03/12/18    1235     118   Incision (Closed) 06/22/18 Other (Comment) Left   06/22/18    1210     16          Labs/Imaging Results for orders placed or performed during the hospital encounter of 07/08/18 (from the past 48 hour(s))  Basic metabolic panel     Status: Abnormal   Collection Time: 07/08/18  3:28 PM  Result Value Ref Range   Sodium 133 (L) 135 - 145 mmol/L   Potassium 3.8 3.5 - 5.1 mmol/L   Chloride 100 98 - 111 mmol/L   CO2 24 22 - 32 mmol/L  Glucose, Bld 219 (H) 70 - 99 mg/dL   BUN 26 (H) 8 - 23 mg/dL   Creatinine, Ser 1.21 0.61 - 1.24 mg/dL   Calcium 9.0 8.9 - 10.3 mg/dL   GFR calc non Af Amer >60 >60 mL/min   GFR calc Af Amer >60 >60 mL/min    Comment: (NOTE) The eGFR has been calculated using the CKD EPI equation. This calculation has not been validated in all clinical situations. eGFR's persistently <60 mL/min signify possible Chronic Kidney Disease.    Anion gap 9 5 - 15    Comment: Performed at [facility], [place] [address] [place], [place] [address]  CBC with Differential     Status: Abnormal   Collection Time: 07/08/18  3:28 PM  Result Value Ref Range   WBC 11.7 (H) 4.0 - 10.5 K/uL   RBC 4.51 4.22 - 5.81 MIL/uL   Hemoglobin 13.0 13.0 - 17.0 g/dL   HCT 39.3 39.0 - 52.0 %   MCV 87.1 80.0 - 100.0 fL   MCH 28.8 26.0 - 34.0 pg   MCHC 33.1 30.0 - 36.0 g/dL   RDW 14.5 11.5 - 15.5 %   Platelets 237  150 - 400 K/uL   nRBC 0.0 0.0 - 0.2 %   Neutrophils Relative % 75 %   Neutro Abs 8.7 (H) 1.7 - 7.7 K/uL   Lymphocytes Relative 14 %   Lymphs Abs 1.6 0.7 - 4.0 K/uL   Monocytes Relative 10 %   Monocytes Absolute 1.1 (H) 0.1 - 1.0 K/uL   Eosinophils Relative 1 %   Eosinophils Absolute 0.1 0.0 - 0.5 K/uL   Basophils Relative 0 %   Basophils Absolute 0.0 0.0 - 0.1 K/uL   Immature Granulocytes 0 %   Abs Immature Granulocytes 0.05 0.00 - 0.07 K/uL    Comment: Performed at [facility], [place] [address], [place], [place] [address]  I-Stat CG4 Lactic Acid, ED     Status: Abnormal   Collection Time: 07/08/18  3:34 PM  Result Value Ref Range   Lactic Acid, Venous 2.52 (HH) 0.5 - 1.9 mmol/L   Comment NOTIFIED PHYSICIAN   Urinalysis, Routine w reflex microscopic     Status: Abnormal   Collection Time: 07/08/18  4:38 PM  Result Value Ref Range   Color, Urine YELLOW YELLOW   APPearance CLEAR CLEAR   Specific Gravity, Urine 1.015 1.005 - 1.030   pH 6.0 5.0 - 8.0   Glucose, UA NEGATIVE NEGATIVE mg/dL   Hgb urine dipstick NEGATIVE NEGATIVE   Bilirubin Urine NEGATIVE NEGATIVE   Ketones, ur 5 (A) NEGATIVE mg/dL   Protein, ur NEGATIVE NEGATIVE mg/dL   Nitrite NEGATIVE NEGATIVE   Leukocytes, UA NEGATIVE NEGATIVE    Comment: Performed at [facility] [address], [place], [place] [address]  I-Stat CG4 Lactic Acid, ED     Status: None   Collection Time: 07/08/18  5:27 PM  Result Value Ref Range   Lactic Acid, Venous 1.50 0.5 - 1.9 mmol/L   No results found. None  Pending Labs Unresulted Labs (From admission, onward)    Start     Ordered   07/08/18 1510  Blood culture (routine x 2)  BLOOD CULTURE X 2,   STAT     07/08/18 1516   Signed and Held  HIV antibody (Routine Testing)  Once,   R     Signed and Held   Signed and Held  Basic metabolic panel  Tomorrow morning,   R     Signed and Held   Signed and Held  CBC  Tomorrow morning,   R     Signed and  Held          Vitals/Pain Today's Vitals   07/08/18 1444 07/08/18 1446 07/08/18 1621  BP: 136/81  (!) 154/89  Pulse: 77  67  Resp: 18  18  Temp: 98.4 F (36.9 C)    TempSrc: Oral    SpO2: 95%  97%  Weight: 84.4 kg    Height: '5\' 8"'  (1.727 m)    PainSc:  7      Isolation Precautions No active isolations  Medications Medications  clindamycin (CLEOCIN) IVPB 600 mg (has no administration in time range)  vancomycin (VANCOCIN) IVPB 1000 mg/200 mL premix (0 mg Intravenous Stopped 07/08/18 1719)  piperacillin-tazobactam (ZOSYN) IVPB 3.375 g (0 g Intravenous Stopped 07/08/18 1620)  sodium chloride 0.9 % bolus 1,000 mL (0 mLs Intravenous Stopped 07/08/18 1650)    Mobility walks with person assist

## 2018-07-08 NOTE — ED Provider Notes (Signed)
[facility] DEPT Provider Note   CSN: [id number] Arrival date & time: 07/08/18  1419     History   Chief Complaint Chief Complaint  Patient presents with  . Post-op Problem    HPI Patrick Aguilar is a 67 y.o. male.  Patrick Aguilar is a 67 y.o. Male with a history of Fournier's gangrene requiring several operations including recent left orchiectomy, diabetes, hypertension and mild diastolic dysfunction, who presents to the emergency department for evaluation of redness, pain and swelling over the left medial thigh.  Patient reports that about 2 weeks ago he had surgery in this area with Dr. Diona Fanti with urology.  Reports that after many operations for Fournier's gangrene, his left testicle was placed in the subcutaneous tissue over the left thigh but this continued to cause severe pain and he elected to have a left orchiectomy, reports after his surgery he had significant improvement in his pain and was doing well up until yesterday when he started to notice some swelling and soreness in this area last night this morning when he woke up the area was red and warm to the touch with worsening pain and swelling.  He had some cold chills last night but has not noted any fevers, no nausea or vomiting, no generalized malaise.  He has not had any dysuria or urinary frequency.  No erythema over the penis.  He denies any abdominal pain.  He has not noted any drainage from the area and prior to yesterday the incision site had been healing extremely well.  Ports he called to Dr. Jeffie Pollock, who is the on-call urologist today who recommended he come in for evaluation.     Past Medical History:  Diagnosis Date  . AKI (acute kidney injury) ([place]) 12/2017  . Anxiety   . Arthritis   . Complication of anesthesia    low respirations, low BP  . COPD (chronic obstructive pulmonary disease) ([place])   . DDD (degenerative disc disease), lumbar   . Diabetes mellitus without  complication ([place])    type 2  . Diastolic dysfunction 83/25/4982   Moderate noted on ECHO  . Fournier's gangrene in male   . GERD (gastroesophageal reflux disease)   . History of ARDS   . History of necrotizing fasciitis    Severe  . Hypertension   . Lumbar spondylosis   . Numbness and tingling of both upper extremities   . Pulmonary hypertension ([place]) 12/14/2017   Mild, noted on ECHO  . Respiratory failure, acute ([place]) 12/2017  . Septic shock ([place]) 12/2017  . Shortness of breath dyspnea   . Testicular pain, left   . Wears glasses     Patient Active Problem List   Diagnosis Date Noted  . Non-healing left groin open wound, initial encounter 01/08/2018  . HTN (hypertension) 12/21/2017  . COPD (chronic obstructive pulmonary disease) ([place]) 12/21/2017  . Fournier gangrene   . Sialolithiasis of submandibular gland 03/12/2015    Past Surgical History:  Procedure Laterality Date  . APPENDECTOMY    . APPLICATION OF A-CELL OF CHEST/ABDOMEN N/A 01/08/2018   Procedure: APPLICATION OF A-CELL OF GROIN;  Surgeon: Wallace Going, DO;  Location: [facility];  Service: Plastics;  Laterality: N/A;  . APPLICATION OF A-CELL OF EXTREMITY N/A 12/25/2017   Procedure: APPLICATION OF A-CELL;  Surgeon: Wallace Going, DO;  Location: [facility];  Service: Plastics;  Laterality: N/A;  . BACK SURGERY     x5  . CARDIAC CATHETERIZATION  06   neg  . CERVICAL DISC SURGERY     x2  . COLONOSCOPY    . DEBRIDEMENT AND CLOSURE WOUND N/A 01/26/2018   Procedure: REVISION OF PERINEUM WOUND WITH DEBRIDEMENT, PARTIAL CLOSURE OF PERINEUM, PLACEMENT OF [place];  Surgeon: Wallace Going, DO;  Location: [facility];  Service: Plastics;  Laterality: N/A;  . DENTAL SURGERY     teeth extractions  . groin wound  01/08/2018   : EXCISION OF GROIN WOUND WITH PLACEMENT OF ACELL, AND PRIMARY WOUND CLOSURE (N/A Scrotum)  . I&D EXTREMITY N/A 03/12/2018   Procedure: IRRIGATION AND DEBRIDEMENT PERIMUM WOUND WITH  CLOSURE;  Surgeon: Wallace Going, DO;  Location: [facility];  Service: Plastics;  Laterality: N/A;  . INCISION AND DRAINAGE OF WOUND N/A 12/25/2017   Procedure: Irrigation and debridement of Fournier's of scrotum with placement of testes in subcutaneous thigh pockets and Acell placement;  Surgeon: Wallace Going, DO;  Location: [facility];  Service: Plastics;  Laterality: N/A;  . INCISION AND DRAINAGE OF WOUND N/A 01/08/2018   Procedure: EXCISION OF GROIN WOUND WITH PLACEMENT OF ACELL, AND PRIMARY WOUND CLOSURE;  Surgeon: Wallace Going, DO;  Location: [facility];  Service: Plastics;  Laterality: N/A;  . ORCHIECTOMY N/A 12/13/2017   Procedure: EXCISION OF SCROTUM AND DEBRIDEMENT OF PENIS;  Surgeon: Franchot Gallo, MD;  Location: [facility];  Service: Urology;  Laterality: N/A;  . ORCHIECTOMY Left 06/22/2018   Procedure: ORCHIECTOMY;  Surgeon: Franchot Gallo, MD;  Location[facility];  Service: Urology;  Laterality: Left;  . PLANTAR FASCIA SURGERY Bilateral   . shoulders Bilateral    rotator cuff  . SUBMANDIBULAR GLAND EXCISION Left 03/12/2015   Procedure: LEFT SUBMANDIBULAR GLAND RESECTION;  Surgeon: Melida Quitter, MD;  Location: [facility];  Service: ENT;  Laterality: Left;  . TONSILLECTOMY          Home Medications    Prior to Admission medications   Medication Sig Start Date End Date Taking? Authorizing Provider  albuterol (PROVENTIL) (2.5 MG/3ML) 0.083% nebulizer solution Inhale 2.5 mg into the lungs every 6 (six) hours as needed for shortness of breath or wheezing. 12/06/14   [provider]  atenolol-chlorthalidone (TENORETIC) 50-25 MG tablet Take 1 tablet by mouth every morning. 02/09/15   [provider]  blood glucose meter kit and supplies KIT Dispense based on patient and insurance preference. Use up to four times daily as directed. (FOR ICD-9 250.00, 250.01). 12/28/17   Florencia Reasons, MD  diclofenac (VOLTAREN) 75 MG EC tablet Take 75 mg by mouth 2 (two)  times daily.    [provider]  doxazosin (CARDURA) 8 MG tablet Take 8 mg by mouth every morning. 02/10/15   [provider]  gabapentin (NEURONTIN) 800 MG tablet Take 800 mg by mouth 4 (four) times daily.  02/09/15   [provider]  metFORMIN (GLUCOPHAGE) 500 MG tablet Take 1 tablet (500 mg total) by mouth 2 (two) times daily with a meal. Patient taking differently: Take 1,000 mg by mouth 2 (two) times daily with a meal.  12/28/17 12/28/18  Florencia Reasons, MD  Nutritional Supplements (JUICE PLUS FIBRE PO) Take by mouth.    [provider]  oxyCODONE-acetaminophen (PERCOCET) 10-325 MG tablet Take 1 tablet by mouth 4 (four) times daily.    [provider]  polyethylene glycol (MIRALAX / GLYCOLAX) packet Take 17 g by mouth daily as needed for mild constipation. 01/12/18   Rayburn, Neta Mends, PA-C  potassium  chloride SA (K-DUR,KLOR-CON) 20 MEQ tablet Take 20 mEq by mouth 2 (two) times daily.  02/09/15   [provider]  ranitidine (ZANTAC) 150 MG tablet Take 150 mg by mouth 2 (two) times daily.  02/09/15   [provider]  traMADol (ULTRAM) 50 MG tablet Take 1 tablet (50 mg total) by mouth every 6 (six) hours as needed. 06/22/18 06/22/19  Franchot Gallo, MD  traZODone (DESYREL) 100 MG tablet Take 100 mg by mouth at bedtime.  11/29/17   [provider]    Family History Family History  Problem Relation Age of Onset  . Lung cancer Mother   . Bladder Cancer Father     Social History Social History   Tobacco Use  . Smoking status: Former Smoker    Packs/day: 1.00    Years: 50.00    Pack years: 50.00    Types: Cigarettes    Last attempt to quit: 12/13/2017    Years since quitting: 0.5  . Smokeless tobacco: Never Used  Substance Use Topics  . Alcohol use: No    Comment: quit alcohol 80's  . Drug use: No     Allergies   Morphine and related   Review of Systems Review of Systems  Constitutional: Positive for  chills. Negative for fever.  HENT: Negative.   Eyes: Negative for visual disturbance.  Respiratory: Negative for cough and shortness of breath.   Cardiovascular: Negative for chest pain.  Gastrointestinal: Negative for abdominal pain, blood in stool, constipation, diarrhea, nausea and vomiting.  Genitourinary: Negative for dysuria and frequency.  Musculoskeletal: Negative for arthralgias and myalgias.  Skin: Negative for color change and rash.     Physical Exam Updated Vital Signs BP 136/81 (BP Location: Left Arm)   Pulse 77   Temp 98.4 F (36.9 C) (Oral)   Resp 18   Ht '5\' 8"'  (1.727 m)   Wt 84.4 kg   SpO2 95%   BMI 28.28 kg/m   Physical Exam      ED Treatments / Results  Labs (all labs ordered are listed, but only abnormal results are displayed) Labs Reviewed  BASIC METABOLIC PANEL - Abnormal; Notable for the following components:      Result Value   Sodium 133 (*)    Glucose, Bld 219 (*)    BUN 26 (*)    All other components within normal limits  CBC WITH DIFFERENTIAL/PLATELET - Abnormal; Notable for the following components:   WBC 11.7 (*)    Neutro Abs 8.7 (*)    Monocytes Absolute 1.1 (*)    All other components within normal limits  URINALYSIS, ROUTINE W REFLEX MICROSCOPIC - Abnormal; Notable for the following components:   Ketones, ur 5 (*)    All other components within normal limits  I-STAT CG4 LACTIC ACID, ED - Abnormal; Notable for the following components:   Lactic Acid, Venous 2.52 (*)    All other components within normal limits  CULTURE, BLOOD (ROUTINE X 2)  CULTURE, BLOOD (ROUTINE X 2)  I-STAT CG4 LACTIC ACID, ED    EKG None  Radiology No results found.  Procedures Procedures (including critical care time)  Medications Ordered in ED Medications  vancomycin (VANCOCIN) IVPB 1000 mg/200 mL premix (1,000 mg Intravenous New Bag/Given 07/08/18 1619)  piperacillin-tazobactam (ZOSYN) IVPB 3.375 g (0 g Intravenous Stopped 07/08/18 1620)    sodium chloride 0.9 % bolus 1,000 mL (1,000 mLs Intravenous New Bag/Given (Non-Interop) 07/08/18 1549)     Initial Impression / Assessment and  Plan / ED Course  I have reviewed the triage vital signs and the nursing notes.  Pertinent labs & imaging results that were available during my care of the patient were reviewed by me and considered in my medical decision making (see chart for details).  Patient presents for redness, swelling, pain and warmth over the left medial thigh where patient had surgery on 10/11 with Dr. Diona Fanti with urology for orchiectomy, testicle had been placed here after multiple surgeries for Fournier's gangrene.  Patient reports after surgery was recovering well until the past 2 days where he started to notice some pain and swelling this morning the area was red and warm to the touch.  He has not had any fevers, no nausea or vomiting, no urinary symptoms.  Normal vitals and well-appearing.  Given patient's history of severe infection in this area we will get labs, lactic acid and blood cultures and start patient on vancomycin and Zosyn.  On exam the area is erythematous and tender to the touch, there is some induration directly around the incision site but there is no palpable fluctuance or crepitus, no expressible drainage, no gangrenous changes.  Labs significant for mild leukocytosis of 11.7 with left shift, glucose is elevated to 219 no other acute electrolyte derangements, normal renal function, urinalysis without signs of infection.  Initial lactic acid is slightly elevated at 2.52, blood cultures collected.  Will discuss with urology.    Dr. Jeffie Pollock with Urology consulted and has sen and evaluated the patient, agrees this is more likely cellulitis and exam is not concerning for Fournier's at this time. Will plan to admit to Urology service, with hospitalist team consulted for diabetes management. Case discussed with Dr. Aileen Fass with hospitalists as well.  Final  Clinical Impressions(s) / ED Diagnoses   Final diagnoses:  Cellulitis of left thigh  Hyperglycemia    ED Discharge Orders    None       Janet Berlin 07/08/18 1725    Charlesetta Shanks, MD 07/09/18 2004

## 2018-07-08 NOTE — Consult Note (Signed)
Subjective: CC: left upper thigh erythema and tenderness.  Hx:  I was call in consultation to see Patrick Aguilar by Dr. Johnney Killian for cellulitis of the left upper medial thigh.  Patrick Aguilar is a diabetic with a history of Fornier's Gangrene with debridement in 4/19.   He had chronic left testicular pain and had a left orchiectomy to remove the testicle from the left thigh pouch by Dr. Diona Fanti on 06/22/18.   He was doing well until last night when he notices some redness on the inner thigh.  There erythema progressed and became more painful today. He contacted me and was encouraged to go to the [facility] ED.  He is afebrile with stable VS but he has a mild leukocytosis with a lactate of 2.52.  His Glucose is 219 but his Cr is 1.21.  He has no voiding complaints and reports that his wound has well healed.  He has been started on Zosyn and Vancomycin.  ROS:  Review of Systems  Skin:       Pain and erythema of the left upper inner thigh.    All other systems reviewed and are negative.   Allergies  Allergen Reactions  . Morphine And Related Itching    Past Medical History:  Diagnosis Date  . AKI (acute kidney injury) ([place]) 12/2017  . Anxiety   . Arthritis   . Complication of anesthesia    low respirations, low BP  . COPD (chronic obstructive pulmonary disease) ([place])   . DDD (degenerative disc disease), lumbar   . Diabetes mellitus without complication ([place])    type 2  . Diastolic dysfunction 13/24/4010   Moderate noted on ECHO  . Fournier's gangrene in male   . GERD (gastroesophageal reflux disease)   . History of ARDS   . History of necrotizing fasciitis    Severe  . Hypertension   . Lumbar spondylosis   . Numbness and tingling of both upper extremities   . Pulmonary hypertension ([place]) 12/14/2017   Mild, noted on ECHO  . Respiratory failure, acute ([place]) 12/2017  . Septic shock ([place]) 12/2017  . Shortness of breath dyspnea   . Testicular pain, left   . Wears glasses     Past Surgical  History:  Procedure Laterality Date  . APPENDECTOMY    . APPLICATION OF A-CELL OF CHEST/ABDOMEN N/A 01/08/2018   Procedure: APPLICATION OF A-CELL OF GROIN;  Surgeon: Wallace Going, DO;  Location: [place];  Service: Plastics;  Laterality: N/A;  . APPLICATION OF A-CELL OF EXTREMITY N/A 12/25/2017   Procedure: APPLICATION OF A-CELL;  Surgeon: Wallace Going, DO;  [facility];  Service: Plastics;  Laterality: N/A;  . BACK SURGERY     x5  . CARDIAC CATHETERIZATION  06   neg  . CERVICAL DISC SURGERY     x2  . COLONOSCOPY    . DEBRIDEMENT AND CLOSURE WOUND N/A 01/26/2018   Procedure: REVISION OF PERINEUM WOUND WITH DEBRIDEMENT, PARTIAL CLOSURE OF PERINEUM, PLACEMENT OF [place];  Surgeon: Wallace Going, DO;  [facility];  Service: Plastics;  Laterality: N/A;  . DENTAL SURGERY     teeth extractions  . groin wound  01/08/2018   : EXCISION OF GROIN WOUND WITH PLACEMENT OF ACELL, AND PRIMARY WOUND CLOSURE (N/A Scrotum)  . I&D EXTREMITY N/A 03/12/2018   Procedure: IRRIGATION AND DEBRIDEMENT PERIMUM WOUND WITH CLOSURE;  Surgeon: Wallace Going, DO;  Location: [place];  Service: Plastics;  Laterality: N/A;  . INCISION  AND DRAINAGE OF WOUND N/A 12/25/2017   Procedure: Irrigation and debridement of Fournier's of scrotum with placement of testes in subcutaneous thigh pockets and Acell placement;  Surgeon: Wallace Going, DO;  [facility];  Service: Plastics;  Laterality: N/A;  . INCISION AND DRAINAGE OF WOUND N/A 01/08/2018   Procedure: EXCISION OF GROIN WOUND WITH PLACEMENT OF ACELL, AND PRIMARY WOUND CLOSURE;  Surgeon: Wallace Going, DO;  Location: [place];  Service: Plastics;  Laterality: N/A;  . ORCHIECTOMY N/A 12/13/2017   Procedure: EXCISION OF SCROTUM AND DEBRIDEMENT OF PENIS;  Surgeon: Franchot Gallo, MD;  [facility];  Service: Urology;  Laterality: N/A;  . ORCHIECTOMY Left 06/22/2018   Procedure: ORCHIECTOMY;  Surgeon: Franchot Gallo, MD;  Location: [facility];  Service: Urology;  Laterality: Left;  . PLANTAR FASCIA SURGERY Bilateral   . shoulders Bilateral    rotator cuff  . SUBMANDIBULAR GLAND EXCISION Left 03/12/2015   Procedure: LEFT SUBMANDIBULAR GLAND RESECTION;  Surgeon: Melida Quitter, MD;  Location: [place];  Service: ENT;  Laterality: Left;  . TONSILLECTOMY      Social History   Socioeconomic History  . Marital status: Single    Spouse name: Not on file  . Number of children: Not on file  . Years of education: Not on file  . Highest education level: Not on file  Occupational History  . Not on file  Social Needs  . Financial resource strain: Not on file  . Food insecurity:    Worry: Not on file    Inability: Not on file  . Transportation needs:    Medical: Not on file    Non-medical: Not on file  Tobacco Use  . Smoking status: Former Smoker    Packs/day: 1.00    Years: 50.00    Pack years: 50.00    Types: Cigarettes    Last attempt to quit: 12/13/2017    Years since quitting: 0.5  . Smokeless tobacco: Never Used  Substance and Sexual Activity  . Alcohol use: No    Comment: quit alcohol 80's  . Drug use: No  . Sexual activity: Not on file  Lifestyle  . Physical activity:    Days per week: Not on file    Minutes per session: Not on file  . Stress: Not on file  Relationships  . Social connections:    Talks on phone: Not on file    Gets together: Not on file    Attends religious service: Not on file    Active member of club or organization: Not on file    Attends meetings of clubs or organizations: Not on file    Relationship status: Not on file  . Intimate partner violence:    Fear of current or ex partner: Not on file    Emotionally abused: Not on file    Physically abused: Not on file    Forced sexual activity: Not on file  Other Topics Concern  . Not on file  Social History Narrative  . Not on file    Family History  Problem Relation Age of Onset  . Lung  cancer Mother   . Bladder Cancer Father     Anti-infectives: Anti-infectives (From admission, onward)   Start     Dose/Rate Route Frequency Ordered Stop   07/08/18 1530  vancomycin (VANCOCIN) IVPB 1000 mg/200 mL premix     1,000 mg 200 mL/hr over 60 Minutes Intravenous  Once 07/08/18 1520  07/08/18 1530  piperacillin-tazobactam (ZOSYN) IVPB 3.375 g     3.375 g 100 mL/hr over 30 Minutes Intravenous  Once 07/08/18 1520 07/08/18 1620      Current Facility-Administered Medications  Medication Dose Route Frequency Provider Last Rate Last Dose  . vancomycin (VANCOCIN) IVPB 1000 mg/200 mL premix  1,000 mg Intravenous Once Ford, Kelsey N, PA-C 200 mL/hr at 07/08/18 1619 1,000 mg at 07/08/18 1619   Current Outpatient Medications  Medication Sig Dispense Refill  . albuterol (PROVENTIL) (2.5 MG/3ML) 0.083% nebulizer solution Inhale 2.5 mg into the lungs every 6 (six) hours as needed for shortness of breath or wheezing.    Marland Kitchen atenolol-chlorthalidone (TENORETIC) 50-25 MG tablet Take 1 tablet by mouth every morning.    . diclofenac (VOLTAREN) 75 MG EC tablet Take 75 mg by mouth 2 (two) times daily.    Marland Kitchen doxazosin (CARDURA) 8 MG tablet Take 8 mg by mouth every morning.    Marland Kitchen FLUAD 0.5 ML SUSY Inject 0.5 mLs into the skin once. 05/02/2018  0  . gabapentin (NEURONTIN) 800 MG tablet Take 800 mg by mouth 4 (four) times daily.   1  . metFORMIN (GLUCOPHAGE) 500 MG tablet Take 1 tablet (500 mg total) by mouth 2 (two) times daily with a meal. (Patient taking differently: Take 1,000 mg by mouth 2 (two) times daily with a meal. ) 60 tablet 11  . Nutritional Supplements (JUICE PLUS FIBRE PO) Take 2 capsules by mouth daily.     Marland Kitchen oxyCODONE-acetaminophen (PERCOCET) 10-325 MG tablet Take 1 tablet by mouth 4 (four) times daily.    . polyethylene glycol (MIRALAX / GLYCOLAX) packet Take 17 g by mouth daily as needed for mild constipation. 14 each 0  . potassium chloride SA (K-DUR,KLOR-CON) 20 MEQ tablet Take 20 mEq  by mouth 2 (two) times daily.   1  . ranitidine (ZANTAC) 150 MG tablet Take 150 mg by mouth 2 (two) times daily.   0  . traZODone (DESYREL) 100 MG tablet Take 100 mg by mouth at bedtime.     . blood glucose meter kit and supplies KIT Dispense based on patient and insurance preference. Use up to four times daily as directed. (FOR ICD-9 250.00, 250.01). 1 each 0  . traMADol (ULTRAM) 50 MG tablet Take 1 tablet (50 mg total) by mouth every 6 (six) hours as needed. (Patient not taking: Reported on 07/08/2018) 10 tablet 0     Objective: Vital signs in last 24 hours: Temp:  [98.4 F (36.9 C)] 98.4 F (36.9 C) (10/27 1444) Pulse Rate:  [67-77] 67 (10/27 1621) Resp:  [18] 18 (10/27 1621) BP: (136-154)/(81-89) 154/89 (10/27 1621) SpO2:  [95 %-97 %] 97 % (10/27 1621) Weight:  [84.4 kg] 84.4 kg (10/27 1444)  Intake/Output from previous day: No intake/output data recorded. Intake/Output this shift: Total I/O In: 100 [IV Piggyback:100] Out: -    Physical Exam  Constitutional: He is oriented to person, place, and time. He appears well-developed and well-nourished. No distress.  HENT:  Head: Normocephalic and atraumatic.  Neck: Normal range of motion. Neck supple. No thyromegaly present.  Cardiovascular: Normal rate, regular rhythm and normal heart sounds.  Pulmonary/Chest: Effort normal and breath sounds normal. No respiratory distress.  Abdominal: Soft. He exhibits no distension and no mass. There is no tenderness.  Genitourinary:  Genitourinary Comments: Normal phallus with adequate meatus.   Scrotum resected but perineal skin is intact without erythema or tenderness.  Right testicle in thigh pouch.   Musculoskeletal: Normal  range of motion. He exhibits tenderness (of the upper inner thigh without induration, crepitus, orange peel changes or fluctuance.). He exhibits no edema.  Neurological: He is alert and oriented to person, place, and time.  Skin: Skin is warm and dry. There is erythema  (Of approximately a 10 x 15cm area on the left upper inner thigh inferior to the prior orchiectomy incision.  The incision is intact without drainage.   ).  Psychiatric: He has a normal mood and affect.    Lab Results:  Recent Labs    07/08/18 1528  WBC 11.7*  HGB 13.0  HCT 39.3  PLT 237   BMET Recent Labs    07/08/18 1528  NA 133*  K 3.8  CL 100  CO2 24  GLUCOSE 219*  BUN 26*  CREATININE 1.21  CALCIUM 9.0   PT/INR No results for input(s): LABPROT, INR in the last 72 hours. ABG No results for input(s): PHART, HCO3 in the last 72 hours.  Invalid input(s): PCO2, PO2  Studies/Results: No results found.   Assessment: Cellulitis of the left upper inner thigh with a Hx of Fornier's gangrene in 4/19 with recent left orchiectomy on 10/11 for chronic testicular pain.  He has moderate erythema with tenderness but no induration, fluctuance, crepitus or gangrene.  Margins of erythema marked at about 330 pm with no progression at 5 pm.  He has a mild leukocytosis and lactate elevation but no constitutional complaints or abnormal VS.  Initial trial of IV Vanc and Zosyn with surgical exploration if progression or inadequate response to antibiotic therapy.   Keep NPO for now.    Hospitalist consult for diabetes management.   CC: Dr. Charlesetta Shanks.     Irine Seal 07/08/2018 [phone]

## 2018-07-08 NOTE — Progress Notes (Signed)
Patient ID: Patrick Aguilar, male   DOB: 04-10-1951, 67 y.o.   MRN: [id number]    Subjective: Pt admitted earlier today by Dr. Annabell Howells for cellulitis of left upper thigh s/p left orchiectomy from left thigh pouch on 06/22/18 by Dr. Retta Diones.  He was started on broad spectrum IV antibiotics with vancomycin and clindamycin.  No fever since admission.  No change in symptoms since admission.  I was asked by Dr. Annabell Howells to check on patient tonight to ensure no progression of infection that would require surgical intervention urgently.  Objective: Vital signs in last 24 hours: Temp:  [98.4 F (36.9 C)] 98.4 F (36.9 C) (10/27 1444) Pulse Rate:  [67-77] 73 (10/27 1700) Resp:  [18] 18 (10/27 1621) BP: (132-154)/(81-100) 132/100 (10/27 1700) SpO2:  [95 %-99 %] 99 % (10/27 1700) Weight:  [84.4 kg] 84.4 kg (10/27 1444)  Intake/Output from previous day: No intake/output data recorded. Intake/Output this shift: No intake/output data recorded.  Physical Exam:  General: Alert and oriented GU: Erythema of left thigh remains unchanged based on prior marked edges from exam earlier today.  Still with tenderness near incision site with some induration but no fluctuance, drainage, or crepitus noted.  Lab Results: Recent Labs    07/08/18 1528  HGB 13.0  HCT 39.3   BMET Recent Labs    07/08/18 1528  NA 133*  K 3.8  CL 100  CO2 24  GLUCOSE 219*  BUN 26*  CREATININE 1.21  CALCIUM 9.0     Studies/Results: No results found.  Assessment/Plan: Exam remains stable.  No indication for surgical intervention at this time.  Continue broad spectrum IV antibiotics and serial exams.   LOS: 0 days   Julicia Krieger,LES 07/08/2018, 7:59 PM

## 2018-07-08 NOTE — ED Triage Notes (Signed)
Left Testicular surgery 2 weeks ago, noted tenderness, swelling and pain today, has history of problems post op.

## 2018-07-08 NOTE — Progress Notes (Addendum)
Pharmacy Antibiotic Note  Patrick Aguilar is a 68 y.o. male admitted on 07/08/2018 with cellulitis.  Pharmacy has been consulted for vancomycin and cefepime dosing.  Plan: 1) Vanc 1g x 1 at 1619 in ER. Give another 1g dose to = 2g as the loading dose then start 1250mg  IV q24 - goal AUC 400-500 2) Cefepime 2g IV q12 for CrCl > 50 ml/min   Height: 5\' 8"  (172.7 cm) Weight: 186 lb (84.4 kg) IBW/kg (Calculated) : 68.4  Temp (24hrs), Avg:98.4 F (36.9 C), Min:98.4 F (36.9 C), Max:98.4 F (36.9 C)  Recent Labs  Lab 07/08/18 1528 07/08/18 1534 07/08/18 1727  WBC 11.7*  --   --   CREATININE 1.21  --   --   LATICACIDVEN  --  2.52* 1.50    Estimated Creatinine Clearance: 62.7 mL/min (by C-G formula based on SCr of 1.21 mg/dL).    Allergies  Allergen Reactions  . Morphine And Related Itching     Thank you for allowing pharmacy to be a part of this patient's care.  Berkley Harvey 07/08/2018 5:31 PM

## 2018-07-08 NOTE — ED Notes (Signed)
Bed: WA19 Expected date:  Expected time:  Means of arrival:  Comments: Tri 1 

## 2018-07-08 NOTE — ED Notes (Signed)
Per Jill Alexanders in pharmacy, put in another order for Vancomycin 1000mg  due to patient needing 2g instead of 1g already given.

## 2018-07-08 NOTE — Consult Note (Addendum)
Medical Consultation   Patrick Aguilar  ZOX:[id number]  DOB: 07-Aug-1951  DOA: 07/08/2018  PCP: Kirstie Peri, MD   Outpatient Specialists: Dr. Annabell Howells   Requesting physician: Dr. Annabell Howells  Reason for consultation: Management of diabetes and hypertension.   History of Present Illness: Patrick Aguilar is an 67 y.o. male past medical history of Fournier's gangrene including a recent left orchiectomy with his left testicle placed in the subcutaneous tissue in the left thigh, uncontrolled diabetes mellitus and hypertension comes in for erythema and pain of the left thigh medially this started with a change in sensation the day prior to admission which is got worst this morning. He denies any fever, chills, nausea, vomiting, diarrhea, chest pain.  Has any dysuria and urinary frequency and any pain over the penis. Due to his uncontrolled diabetes and hypertension we are consulted for diabetes management and antibiotic recommendations.  In the ED he was found to be  stable with blood pressure 159/89 with a pulse of 98, lactic acid of 2.5 that improved to 1.5 after IV antibiotics and IV fluid mild white count with a left shift Review of Systems:  ROS As per HPI otherwise 10 point review of systems negative.    Past Medical History: Past Medical History:  Diagnosis Date  . AKI (acute kidney injury) ([facility]) 12/2017  . Anxiety   . Arthritis   . Complication of anesthesia    low respirations, low BP  . COPD (chronic obstructive pulmonary disease) ([facility])   . DDD (degenerative disc disease), lumbar   . Diabetes mellitus without complication ([facility])    type 2  . Diastolic dysfunction 12/14/2017   Moderate noted on ECHO  . Fournier's gangrene in male   . GERD (gastroesophageal reflux disease)   . History of ARDS   . History of necrotizing fasciitis    Severe  . Hypertension   . Lumbar spondylosis   . Numbness and tingling of both upper extremities   . Pulmonary hypertension  ([facility]) 12/14/2017   Mild, noted on ECHO  . Respiratory failure, acute ([facility]) 12/2017  . Septic shock ([facility]) 12/2017  . Shortness of breath dyspnea   . Testicular pain, left   . Wears glasses     Past Surgical History: Past Surgical History:  Procedure Laterality Date  . APPENDECTOMY    . APPLICATION OF A-CELL OF CHEST/ABDOMEN N/A 01/08/2018   Procedure: APPLICATION OF A-CELL OF GROIN;  Surgeon: Peggye Form, DO;  [facility];  Service: Plastics;  Laterality: N/A;  . APPLICATION OF A-CELL OF EXTREMITY N/A 12/25/2017   Procedure: APPLICATION OF A-CELL;  Surgeon: Peggye Form, DO;  [facility];  Service: Plastics;  Laterality: N/A;  . BACK SURGERY     x5  . CARDIAC CATHETERIZATION  06   neg  . CERVICAL DISC SURGERY     x2  . COLONOSCOPY    . DEBRIDEMENT AND CLOSURE WOUND N/A 01/26/2018   Procedure: REVISION OF PERINEUM WOUND WITH DEBRIDEMENT, PARTIAL CLOSURE OF PERINEUM, PLACEMENT OF CELLERATE COLLAGEN;  Surgeon: Peggye Form, DO;  [facility];  Service: Plastics;  Laterality: N/A;  . DENTAL SURGERY     teeth extractions  . groin wound  01/08/2018   : EXCISION OF GROIN WOUND WITH PLACEMENT OF ACELL, AND PRIMARY WOUND CLOSURE (N/A Scrotum)  . I&D EXTREMITY N/A 03/12/2018   Procedure: IRRIGATION AND DEBRIDEMENT PERIMUM WOUND WITH CLOSURE;  Surgeon: Peggye Form, DO;  [facility];  Service: Plastics;  Laterality: N/A;  . INCISION AND DRAINAGE OF WOUND N/A 12/25/2017   Procedure: Irrigation and debridement of Fournier's of scrotum with placement of testes in subcutaneous thigh pockets and Acell placement;  Surgeon: Peggye Form, DO;  [facility];  Service: Plastics;  Laterality: N/A;  . INCISION AND DRAINAGE OF WOUND N/A 01/08/2018   Procedure: EXCISION OF GROIN WOUND WITH PLACEMENT OF ACELL, AND PRIMARY WOUND CLOSURE;  Surgeon: Peggye Form, DO;  Location[facility];  Service: Plastics;  Laterality: N/A;  . ORCHIECTOMY N/A  12/13/2017   Procedure: EXCISION OF SCROTUM AND DEBRIDEMENT OF PENIS;  Surgeon: Marcine Matar, MD;  [facility];  Service: Urology;  Laterality: N/A;  . ORCHIECTOMY Left 06/22/2018   Procedure: ORCHIECTOMY;  Surgeon: Marcine Matar, MD;  Location: [facility];  Service: Urology;  Laterality: Left;  . PLANTAR FASCIA SURGERY Bilateral   . shoulders Bilateral    rotator cuff  . SUBMANDIBULAR GLAND EXCISION Left 03/12/2015   Procedure: LEFT SUBMANDIBULAR GLAND RESECTION;  Surgeon: Christia Reading, MD;  Location: [facility];  Service: ENT;  Laterality: Left;  . TONSILLECTOMY       Allergies:   Allergies  Allergen Reactions  . Morphine And Related Itching     Social History:  reports that he quit smoking about 6 months ago. His smoking use included cigarettes. He has a 50.00 pack-year smoking history. He has never used smokeless tobacco. He reports that he does not drink alcohol or use drugs.   Family History: Family History  Problem Relation Age of Onset  . Lung cancer Mother   . Bladder Cancer Father       Physical Exam: Vitals:   07/08/18 1444 07/08/18 1621  BP: 136/81 (!) 154/89  Pulse: 77 67  Resp: 18 18  Temp: 98.4 F (36.9 C)   TempSrc: Oral   SpO2: 95% 97%  Weight: 84.4 kg   Height: 5\' 8"  (1.727 m)     Constitutional: Nontoxic-appearing alert and awake, oriented x3, not in any acute distress. Eyes: PERLA, EOMI, irises appear normal, anicteric sclera,  ENMT: external ears and nose appear normal,            Lips appears normal, oropharynx mucosa, tongue, posterior pharynx appear normal  Neck: neck appears normal, no masses, normal ROM, no thyromegaly, no JVD  CVS: S1-S2 clear, no murmur rubs or gallops, no LE edema, normal pedal pulses  Respiratory:  clear to auscultation bilaterally, no wheezing, rales or rhonchi. Respiratory effort normal. No accessory muscle use.  Abdomen: soft nontender, nondistended, normal bowel sounds, no  hepatosplenomegaly, no hernias  Musculoskeletal: : no cyanosis, clubbing or edema noted bilaterally                      Neuro: Cranial nerves II-XII intact, strength, sensation, reflexes Psych: judgement and insight appear normal, stable mood and affect, mental status Skin: Has an erythematous indurated on the medial left thigh tender to touch and warm.  Data reviewed:  I have personally reviewed following labs and imaging studies Labs:  CBC: Recent Labs  Lab 07/08/18 1528  WBC 11.7*  NEUTROABS 8.7*  HGB 13.0  HCT 39.3  MCV 87.1  PLT 237    Basic Metabolic Panel: Recent Labs  Lab 07/08/18 1528  NA 133*  K 3.8  CL 100  CO2 24  GLUCOSE 219*  BUN 26*  CREATININE 1.21  CALCIUM 9.0   GFR Estimated Creatinine Clearance: 62.7 mL/min (by C-G formula based on SCr of 1.21 mg/dL). Liver Function Tests: No results for input(s): AST, ALT, ALKPHOS, BILITOT, PROT, ALBUMIN in the last 168 hours. No results for input(s): LIPASE, AMYLASE in the last 168 hours. No results for input(s): AMMONIA in the last 168 hours. Coagulation profile No results for input(s): INR, PROTIME in the last 168 hours.  Cardiac Enzymes: No results for input(s): CKTOTAL, CKMB, CKMBINDEX, TROPONINI in the last 168 hours. BNP: Invalid input(s): POCBNP CBG: No results for input(s): GLUCAP in the last 168 hours. D-Dimer No results for input(s): DDIMER in the last 72 hours. Hgb A1c No results for input(s): HGBA1C in the last 72 hours. Lipid Profile No results for input(s): CHOL, HDL, LDLCALC, TRIG, CHOLHDL, LDLDIRECT in the last 72 hours. Thyroid function studies No results for input(s): TSH, T4TOTAL, T3FREE, THYROIDAB in the last 72 hours.  Invalid input(s): FREET3 Anemia work up No results for input(s): VITAMINB12, FOLATE, FERRITIN, TIBC, IRON, RETICCTPCT in the last 72 hours. Urinalysis    Component Value Date/Time   COLORURINE YELLOW 07/08/2018 1638   APPEARANCEUR CLEAR 07/08/2018 1638    LABSPEC 1.015 07/08/2018 1638   PHURINE 6.0 07/08/2018 1638   GLUCOSEU NEGATIVE 07/08/2018 1638   HGBUR NEGATIVE 07/08/2018 1638   BILIRUBINUR NEGATIVE 07/08/2018 1638   KETONESUR 5 (A) 07/08/2018 1638   PROTEINUR NEGATIVE 07/08/2018 1638   UROBILINOGEN 1.0 02/02/2010 1048   NITRITE NEGATIVE 07/08/2018 1638   LEUKOCYTESUR NEGATIVE 07/08/2018 1638     Microbiology No results found for this or any previous visit (from the past 240 hour(s)).     Inpatient Medications:   Scheduled Meds: Continuous Infusions: . clindamycin (CLEOCIN) IV       Radiological Exams on Admission: No results found.  Impression/Recommendations Cellulitis of left leg: Urology has evaluated the patient he was started on IV clindamycin, vancomycin and Zosyn. I would recommend continuing IV vancomycin and Clinda will DC the Zosyn start him on vancomycin. Blood cultures have already been ordered. He has been fluid resuscitated. Further management per urology.  Essential HTN (hypertension) Recommend holding chlorthalidone continue atenolol.  Type 2 diabetes mellitus without complication (H[facility]) Discontinue metformin start him on low-dose Lantus plus sliding scale insulin. A1c allow diet    Thank you for this consultation.  Our [facility] hospitalist team will follow the patient with you.   Time Spent: 60 min  Marinda Elk M.D. [facility] 07/08/2018, 5:36 PM

## 2018-07-09 LAB — HEMOGLOBIN A1C
HEMOGLOBIN A1C: 6.1 % — AB (ref 4.8–5.6)
Mean Plasma Glucose: 128.37 mg/dL

## 2018-07-09 LAB — GLUCOSE, CAPILLARY
GLUCOSE-CAPILLARY: 118 mg/dL — AB (ref 70–99)
GLUCOSE-CAPILLARY: 121 mg/dL — AB (ref 70–99)
GLUCOSE-CAPILLARY: 110 mg/dL — AB (ref 70–99)
Glucose-Capillary: 131 mg/dL — ABNORMAL HIGH (ref 70–99)

## 2018-07-09 LAB — BASIC METABOLIC PANEL
Anion gap: 8 (ref 5–15)
BUN: 20 mg/dL (ref 8–23)
CHLORIDE: 104 mmol/L (ref 98–111)
CO2: 25 mmol/L (ref 22–32)
Calcium: 8.7 mg/dL — ABNORMAL LOW (ref 8.9–10.3)
Creatinine, Ser: 1.14 mg/dL (ref 0.61–1.24)
GFR calc Af Amer: >60 mL/min (ref >60)
Glucose, Bld: 140 mg/dL — ABNORMAL HIGH (ref 70–99)
Potassium: 3.3 mmol/L — ABNORMAL LOW (ref 3.5–5.1)
Sodium: 137 mmol/L (ref 135–145)

## 2018-07-09 LAB — CBC
HEMATOCRIT: 37 % — AB (ref 39.0–52.0)
HEMOGLOBIN: 12.1 g/dL — AB (ref 13.0–17.0)
MCH: 28.9 pg (ref 26.0–34.0)
MCHC: 32.7 g/dL (ref 30.0–36.0)
MCV: 88.5 fL (ref 80.0–100.0)
Platelets: 218 10*3/uL (ref 150–400)
RBC: 4.18 MIL/uL — ABNORMAL LOW (ref 4.22–5.81)
RDW: 14.4 % (ref 11.5–15.5)
WBC: 10.8 10*3/uL — AB (ref 4.0–10.5)
nRBC: 0 % (ref 0.0–0.2)

## 2018-07-09 LAB — HIV ANTIBODY (ROUTINE TESTING W REFLEX): HIV Screen 4th Generation wRfx: NONREACTIVE

## 2018-07-09 LAB — MAGNESIUM: MAGNESIUM: 1.8 mg/dL (ref 1.7–2.4)

## 2018-07-09 MED ORDER — GABAPENTIN 800 MG PO TABS: 800.0000 mg | ORAL_TABLET | Freq: Four times a day (QID) | ORAL | Status: DC

## 2018-07-09 MED ORDER — POTASSIUM CHLORIDE IN NACL 20-0.45 MEQ/L-% IV SOLN
INTRAVENOUS | Status: DC
  Administered 2018-07-09: 18:00:00 via INTRAVENOUS
  Filled 2018-07-09: qty 1000

## 2018-07-09 MED ORDER — OXYCODONE-ACETAMINOPHEN 10-325 MG PO TABS: 1.0000 | ORAL_TABLET | Freq: Four times a day (QID) | ORAL | Status: DC

## 2018-07-09 MED ORDER — ENOXAPARIN SODIUM 40 MG/0.4ML ~~LOC~~ SOLN
40.0000 mg | SUBCUTANEOUS | Status: DC
  Administered 2018-07-09 – 2018-07-10 (×2): 40 mg via SUBCUTANEOUS
  Filled 2018-07-09 (×2): qty 0.4

## 2018-07-09 MED ORDER — ATENOLOL-CHLORTHALIDONE 50-25 MG PO TABS: 1.0000 | ORAL_TABLET | ORAL | Status: DC

## 2018-07-09 MED ORDER — TRAMADOL HCL 50 MG PO TABS
50.0000 mg | ORAL_TABLET | Freq: Four times a day (QID) | ORAL | Status: DC | PRN
  Administered 2018-07-10 – 2018-07-11 (×5): 50 mg via ORAL
  Filled 2018-07-09 (×6): qty 1

## 2018-07-09 MED ORDER — ATENOLOL 50 MG PO TABS
50.0000 mg | ORAL_TABLET | Freq: Every day | ORAL | Status: DC
  Administered 2018-07-09 – 2018-07-12 (×4): 50 mg via ORAL
  Filled 2018-07-09 (×4): qty 1

## 2018-07-09 MED ORDER — POTASSIUM CHLORIDE CRYS ER 20 MEQ PO TBCR
40.0000 meq | EXTENDED_RELEASE_TABLET | Freq: Two times a day (BID) | ORAL | Status: AC
  Administered 2018-07-09 (×2): 40 meq via ORAL
  Filled 2018-07-09 (×2): qty 2

## 2018-07-09 NOTE — Progress Notes (Signed)
[facility] CONSULT NOTE    Progress Note  Patrick Aguilar  ZOX:[id number] DOB: Jun 29, 1951 DOA: 07/08/2018 PCP: Kirstie Peri, MD     Brief Narrative:   Patrick Aguilar is an 67 y.o. male past medical history of foreign years gangrene including a recent left orchiectomy with a left testicle placed subcutaneously on the left thigh, uncontrolled diabetes and hypertension comes in with erythema and pain in the medial left thigh that started on the day of admission.  Assessment/Plan:   Cellulitis of left leg: Continue IV clindamycin, vancomycin and cefepime Blood cultures have been negative till date. He has been for the resuscitated.  Blood pressure has remained stable. Still tender in the left thigh medially, erythema has extended beyond the demarcated line. Leukocytosis is improved, he has remained afebrile. Further management per urology.  Mild hypokalemia: Replete orally check magnesium level.  Essential HTN (hypertension) Continue atenolol, hold chlorthalidone.   Type 2 diabetes mellitus without complication (HCC) Continue to hold metformin blood glucose improved continue sliding scale insulin.   DVT prophylaxis: lovenox  Family Communication:none Disposition Plan/Barrier to D/C: Per Urology Code Status:     Code Status Orders  (From admission, onward)         Start     Ordered   07/08/18 1815  Full code  Continuous     07/08/18 1814        Code Status History    Date Active Date Inactive Code Status Order ID Comments User Context   07/08/2018 1814 07/08/2018 1814 Full Code [id number]  Bjorn Pippin, MD Inpatient   01/08/2018 1105 01/15/2018 1719 Full Code [id number]  Peggye Form, DO Inpatient   12/13/2017 2259 12/28/2017 1605 Full Code [id number]  Gigi Gin, MD Inpatient   03/12/2015 1532 03/13/2015 1425 Full Code [id number]  Christia Reading, MD Inpatient    Advance Directive Documentation     Most Recent Value  Type of Advance Directive   Healthcare Power of Attorney, Living will  Pre-existing out of facility DNR order (yellow form or pink MOST form)  -  "MOST" Form in Place?  -        IV Access:    Peripheral IV   Procedures and diagnostic studies:   No results found.   Medical Consultants:    None.  Anti-Infectives:   IV vancomycin, cefepime and clindamycin.  Subjective:    Tish Frederickson he relates it does not feel as painful but is still warm to touch.  Objective:    Vitals:   07/08/18 1700 07/08/18 2253 07/09/18 0213 07/09/18 0510  BP: (!) 132/100 (!) 170/89 (!) 141/87 (!) 157/90  Pulse: 73 68 72 63  Resp:  16 18 20   Temp:  98.4 F (36.9 C) 98.5 F (36.9 C) 97.6 F (36.4 C)  TempSrc:  Oral Oral Oral  SpO2: 99% 94% 94% 97%  Weight:      Height:        Intake/Output Summary (Last 24 hours) at 07/09/2018 0102 Last data filed at 07/09/2018 0600 Gross per 24 hour  Intake 2921.42 ml  Output 1550 ml  Net 1371.42 ml   Filed Weights   07/08/18 1444  Weight: 84.4 kg    Exam: General exam: In no acute distress. Respiratory system: Good air movement and clear to auscultation. Cardiovascular system: S1 & S2 heard, RRR.  Gastrointestinal system: Abdomen is nondistended, soft and nontender.  Central nervous system: Alert and oriented. No focal neurological deficits. Extremities: No pedal edema. Skin:  Still erythematous and warm to touch, the erythema has spread beyond the demarcated area. Psychiatry: Judgement and insight appear normal. Mood & affect appropriate.    Data Reviewed:    Labs: Basic Metabolic Panel: Recent Labs  Lab 07/08/18 1528 07/09/18 0450  NA 133* 137  K 3.8 3.3*  CL 100 104  CO2 24 25  GLUCOSE 219* 140*  BUN 26* 20  CREATININE 1.21 1.14  CALCIUM 9.0 8.7*   GFR Estimated Creatinine Clearance: 66.5 mL/min (by C-G formula based on SCr of 1.14 mg/dL). Liver Function Tests: No results for input(s): AST, ALT, ALKPHOS, BILITOT, PROT, ALBUMIN in the last  168 hours. No results for input(s): LIPASE, AMYLASE in the last 168 hours. No results for input(s): AMMONIA in the last 168 hours. Coagulation profile No results for input(s): INR, PROTIME in the last 168 hours.  CBC: Recent Labs  Lab 07/08/18 1528 07/09/18 0450  WBC 11.7* 10.8*  NEUTROABS 8.7*  --   HGB 13.0 12.1*  HCT 39.3 37.0*  MCV 87.1 88.5  PLT 237 218   Cardiac Enzymes: No results for input(s): CKTOTAL, CKMB, CKMBINDEX, TROPONINI in the last 168 hours. BNP (last 3 results) No results for input(s): PROBNP in the last 8760 hours. CBG: Recent Labs  Lab 07/08/18 1808 07/08/18 2124 07/08/18 2326 07/09/18 0731  GLUCAP 116* 202* 116* 131*   D-Dimer: No results for input(s): DDIMER in the last 72 hours. Hgb A1c: Recent Labs    07/09/18 0450  HGBA1C 6.1*   Lipid Profile: No results for input(s): CHOL, HDL, LDLCALC, TRIG, CHOLHDL, LDLDIRECT in the last 72 hours. Thyroid function studies: No results for input(s): TSH, T4TOTAL, T3FREE, THYROIDAB in the last 72 hours.  Invalid input(s): FREET3 Anemia work up: No results for input(s): VITAMINB12, FOLATE, FERRITIN, TIBC, IRON, RETICCTPCT in the last 72 hours. Sepsis Labs: Recent Labs  Lab 07/08/18 1528 07/08/18 1534 07/08/18 1727 07/09/18 0450  WBC 11.7*  --   --  10.8*  LATICACIDVEN  --  2.52* 1.50  --    Microbiology No results found for this or any previous visit (from the past 240 hour(s)).   Medications:   . atenolol  50 mg Oral Q breakfast   And  . chlorthalidone  25 mg Oral Q breakfast  . diclofenac  75 mg Oral BID  . doxazosin  8 mg Oral Q breakfast  . famotidine  20 mg Oral Daily  . gabapentin  800 mg Oral QID  . insulin aspart  0-15 Units Subcutaneous TID WC  . insulin aspart  0-5 Units Subcutaneous QHS  . insulin aspart  3 Units Subcutaneous TID WC  . oxyCODONE-acetaminophen  1 tablet Oral Q6H   And  . oxyCODONE  5 mg Oral Q6H  . polyethylene glycol  17 g Oral Daily  . traZODone  100 mg  Oral QHS   Continuous Infusions: . 0.45 % NaCl with KCl 20 mEq / L 100 mL/hr at 07/09/18 0515  . ceFEPime (MAXIPIME) IV 2 g (07/08/18 2328)  . clindamycin (CLEOCIN) IV 600 mg (07/09/18 0510)  . vancomycin       LOS: 1 day   Marinda Elk  [facility] Pager [phone]  *Please refer to [email or web address], password [email or web address] to get updated schedule on who will round on this patient, as hospitalists switch teams weekly. If 7PM-7AM, please contact night-coverage at [email or web address], password [email or web address] for any overnight needs.  07/09/2018, 8:22 AM

## 2018-07-09 NOTE — H&P (Signed)
Please see Consult note from 07/08/18.  I am unable to rename it as the H&P which is was.

## 2018-07-09 NOTE — Progress Notes (Signed)
  Subjective: Patient reports that he has not had shakes or chills over the past day.  He still has some tenderness in his groin area.  Objective: Vital signs in last 24 hours: Temp:  [97.6 F (36.4 C)-98.5 F (36.9 C)] 97.6 F (36.4 C) (10/28 0510) Pulse Rate:  [63-77] 63 (10/28 0510) Resp:  [16-20] 20 (10/28 0510) BP: (132-170)/(81-100) 157/90 (10/28 0510) SpO2:  [94 %-99 %] 97 % (10/28 0510) Weight:  [84.4 kg] 84.4 kg (10/27 1444)  Intake/Output from previous day: 10/27 0701 - 10/28 0700 In: 2921.4 [P.O.:360; I.V.:1061.4; IV Piggyback:1500] Out: 1550 [Urine:1550] Intake/Output this shift: No intake/output data recorded.  Physical Exam:  Constitutional: Vital signs reviewed. WD WN in NAD   Eyes: PERRL, No scleral icterus.   Cardiovascular: RRR Pulmonary/Chest: Normal effort Genitourinary: There is no infrapubic or inguinal erythema.  Left femoral incision is well-healed.  There is still erythema in the peri-incisional area, but this is now well inside the marked area that was drawn yesterday.  There is minimal induration.  There is still tenderness. Extremities: No cyanosis or edema   Lab Results: Recent Labs    07/08/18 1528 07/09/18 0450  HGB 13.0 12.1*  HCT 39.3 37.0*   BMET Recent Labs    07/08/18 1528 07/09/18 0450  NA 133* 137  K 3.8 3.3*  CL 100 104  CO2 24 25  GLUCOSE 219* 140*  BUN 26* 20  CREATININE 1.21 1.14  CALCIUM 9.0 8.7*   No results for input(s): LABPT, INR in the last 72 hours. No results for input(s): LABURIN in the last 72 hours. Results for orders placed or performed during the hospital encounter of 01/08/18  Culture, Urine     Status: None   Collection Time: 01/10/18  1:15 PM  Result Value Ref Range Status   Specimen Description URINE, CLEAN CATCH  Final   Special Requests NONE  Final   Culture   Final    NO GROWTH Performed at [facility], [address] [place], [place] [address]    Report Status 01/11/2018 FINAL   Final    Studies/Results: No results found.  Assessment/Plan:   Cellulitis and left femoral area.  I do not feel any areas that are pointing/fluctuant.  It does appear that this is improved since yesterday    I would recommend continued IV antibiotics at least for 1 more day.  We will continue to follow   LOS: 1 day   Chelsea Aus 07/09/2018, 9:01 AM

## 2018-07-10 LAB — GLUCOSE, CAPILLARY
GLUCOSE-CAPILLARY: 133 mg/dL — AB (ref 70–99)
GLUCOSE-CAPILLARY: 131 mg/dL — AB (ref 70–99)
GLUCOSE-CAPILLARY: 134 mg/dL — AB (ref 70–99)
GLUCOSE-CAPILLARY: 231 mg/dL — AB (ref 70–99)
GLUCOSE-CAPILLARY: 190 mg/dL — AB (ref 70–99)

## 2018-07-10 MED ORDER — HYDRALAZINE HCL 20 MG/ML IJ SOLN
10.0000 mg | INTRAMUSCULAR | Status: DC | PRN
  Administered 2018-07-10: 10 mg via INTRAVENOUS
  Filled 2018-07-10: qty 1

## 2018-07-10 MED ORDER — SODIUM CHLORIDE 0.45 % IV SOLN
INTRAVENOUS | Status: DC
  Administered 2018-07-10 – 2018-07-12 (×4): via INTRAVENOUS
  Filled 2018-07-10 (×6): qty 1000

## 2018-07-10 NOTE — Plan of Care (Signed)
  Problem: Nutrition: Goal: Adequate nutrition will be maintained Outcome: Completed/Met

## 2018-07-10 NOTE — Progress Notes (Signed)
Subjective: CC: Left thigh pain.  Hx:  Patrick Aguilar reports a low grade fever overnight and some increased pain in the area of concern.  His WBC is down to 10.8.   He has no other associated signs or symptoms.   ROS:  Review of Systems  Constitutional: Positive for fever.  Musculoskeletal:       Pain in the left upper thigh.    Anti-infectives: Anti-infectives (From admission, onward)   Start     Dose/Rate Route Frequency Ordered Stop   07/09/18 1800  vancomycin (VANCOCIN) 1,250 mg in sodium chloride 0.9 % 250 mL IVPB     1,250 mg 166.7 mL/hr over 90 Minutes Intravenous Every 24 hours 07/08/18 1739     07/09/18 0000  ceFEPIme (MAXIPIME) 2 g in sodium chloride 0.9 % 100 mL IVPB     2 g 200 mL/hr over 30 Minutes Intravenous Every 12 hours 07/08/18 1751     07/08/18 2200  clindamycin (CLEOCIN) IVPB 600 mg     600 mg 100 mL/hr over 30 Minutes Intravenous Every 8 hours 07/08/18 1814     07/08/18 1745  vancomycin (VANCOCIN) IVPB 1000 mg/200 mL premix     1,000 mg 200 mL/hr over 60 Minutes Intravenous  Once 07/08/18 1739 07/08/18 2007   07/08/18 1745  vancomycin (VANCOCIN) IVPB 1000 mg/200 mL premix  Status:  Discontinued     1,000 mg 200 mL/hr over 60 Minutes Intravenous  Once 07/08/18 1739 07/08/18 1740   07/08/18 1730  clindamycin (CLEOCIN) IVPB 600 mg  Status:  Discontinued     600 mg 100 mL/hr over 30 Minutes Intravenous  Once 07/08/18 1729 07/08/18 1901   07/08/18 1530  vancomycin (VANCOCIN) IVPB 1000 mg/200 mL premix     1,000 mg 200 mL/hr over 60 Minutes Intravenous  Once 07/08/18 1520 07/08/18 1719   07/08/18 1530  piperacillin-tazobactam (ZOSYN) IVPB 3.375 g     3.375 g 100 mL/hr over 30 Minutes Intravenous  Once 07/08/18 1520 07/08/18 1620      Current Facility-Administered Medications  Medication Dose Route Frequency Provider Last Rate Last Dose  . 0.45 % NaCl with KCl 20 mEq / L infusion   Intravenous Continuous Marinda Elk, MD 50 mL/hr at 07/10/18 0600     . albuterol (PROVENTIL) (2.5 MG/3ML) 0.083% nebulizer solution 2.5 mg  2.5 mg Inhalation Q6H PRN Bjorn Pippin, MD      . atenolol (TENORMIN) tablet 50 mg  50 mg Oral Daily Marinda Elk, MD   50 mg at 07/09/18 0853  . bisacodyl (DULCOLAX) suppository 10 mg  10 mg Rectal Daily PRN Bjorn Pippin, MD      . ceFEPIme (MAXIPIME) 2 g in sodium chloride 0.9 % 100 mL IVPB  2 g Intravenous Q12H Marinda Elk, MD   Stopped at 07/10/18 0136  . clindamycin (CLEOCIN) IVPB 600 mg  600 mg Intravenous Q8H Bjorn Pippin, MD 100 mL/hr at 07/10/18 0600    . diclofenac (VOLTAREN) EC tablet 75 mg  75 mg Oral BID Bjorn Pippin, MD   75 mg at 07/09/18 2335  . doxazosin (CARDURA) tablet 8 mg  8 mg Oral Q breakfast Bjorn Pippin, MD   8 mg at 07/09/18 0847  . enoxaparin (LOVENOX) injection 40 mg  40 mg Subcutaneous Q24H Marinda Elk, MD   40 mg at 07/09/18 1735  . famotidine (PEPCID) tablet 20 mg  20 mg Oral Daily Bjorn Pippin, MD   20 mg at 07/09/18 0847  .  gabapentin (NEURONTIN) capsule 800 mg  800 mg Oral QID Marinda Elk, MD   800 mg at 07/09/18 2307  . HYDROmorphone (DILAUDID) injection 0.5-1 mg  0.5-1 mg Intravenous Q2H PRN Bjorn Pippin, MD      . insulin aspart (novoLOG) injection 0-15 Units  0-15 Units Subcutaneous TID WC Marinda Elk, MD   2 Units at 07/09/18 1737  . insulin aspart (novoLOG) injection 0-5 Units  0-5 Units Subcutaneous QHS Marinda Elk, MD      . insulin aspart (novoLOG) injection 3 Units  3 Units Subcutaneous TID WC Marinda Elk, MD   3 Units at 07/09/18 1735  . ondansetron (ZOFRAN) injection 4 mg  4 mg Intravenous Q4H PRN Bjorn Pippin, MD      . oxyCODONE-acetaminophen (PERCOCET/ROXICET) 5-325 MG per tablet 1 tablet  1 tablet Oral Q6H Marinda Elk, MD   1 tablet at 07/10/18 0524   And  . oxyCODONE (Oxy IR/ROXICODONE) immediate release tablet 5 mg  5 mg Oral Q6H Marinda Elk, MD   5 mg at 07/10/18 0524  . polyethylene glycol (MIRALAX /  GLYCOLAX) packet 17 g  17 g Oral Daily Marinda Elk, MD   17 g at 07/09/18 0847  . senna-docusate (Senokot-S) tablet 1 tablet  1 tablet Oral QHS PRN Bjorn Pippin, MD      . sodium phosphate (FLEET) 7-19 GM/118ML enema 1 enema  1 enema Rectal Once PRN Bjorn Pippin, MD      . traMADol Janean Sark) tablet 50 mg  50 mg Oral Q6H PRN Marinda Elk, MD   50 mg at 07/10/18 0112  . traZODone (DESYREL) tablet 100 mg  100 mg Oral QHS Bjorn Pippin, MD   100 mg at 07/09/18 2307  . vancomycin (VANCOCIN) 1,250 mg in sodium chloride 0.9 % 250 mL IVPB  1,250 mg Intravenous Q24H Marinda Elk, MD   Stopped at 07/09/18 1911     Objective: Vital signs in last 24 hours: Temp:  [97.7 F (36.5 C)-100 F (37.8 C)] 98.4 F (36.9 C) (10/29 0520) Pulse Rate:  [65-85] 65 (10/29 0520) Resp:  [16-18] 16 (10/29 0520) BP: (141-162)/(83-99) 154/83 (10/29 0520) SpO2:  [91 %-94 %] 91 % (10/29 0520)  Intake/Output from previous day: 10/28 0701 - 10/29 0700 In: 2716.5 [P.O.:1320; I.V.:898.5; IV Piggyback:498] Out: 3450 [Urine:3450] Intake/Output this shift: No intake/output data recorded.   Physical Exam  Constitutional: He appears well-developed and well-nourished.  Cardiovascular: Normal rate, regular rhythm and normal heart sounds.  Pulmonary/Chest: Effort normal and breath sounds normal. No respiratory distress.  Abdominal: Soft. There is no tenderness.  Musculoskeletal: Normal range of motion. He exhibits tenderness (The area of tenderness is reduced but he is still  tender at the superior margin of the affected area in the left upper inner thigh.   ). He exhibits no edema.  Skin: Skin is warm and dry. There is erythema (There area of erythema has slightly enlarged inferiorly and the skin has more of an orange peel appearance.  There is no fluctuance, crepitus or gangrenous  tissue. ).  Psychiatric: He has a normal mood and affect.  Vitals reviewed.   Lab Results:  Recent Labs     07/08/18 1528 07/09/18 0450  WBC 11.7* 10.8*  HGB 13.0 12.1*  HCT 39.3 37.0*  PLT 237 218   BMET Recent Labs    07/08/18 1528 07/09/18 0450  NA 133* 137  K 3.8 3.3*  CL 100 104  CO2  24 25  GLUCOSE 219* 140*  BUN 26* 20  CREATININE 1.21 1.14  CALCIUM 9.0 8.7*   PT/INR No results for input(s): LABPROT, INR in the last 72 hours. ABG No results for input(s): PHART, HCO3 in the last 72 hours.  Invalid input(s): PCO2, PO2  Studies/Results: No results found.   Assessment and Plan: Cellulitis/fasciitis of the left upper inner thigh.   He has mild improvement with a reduced area of tenderness but an enlarged area of erythema.    I will discuss his case with Dr. Retta Diones and make him NPO should exploration be required.   Hypokalemia.  I will increase his potassium in the IV.       LOS: 2 days    Bjorn Pippin 07/10/2018 [phone] ID: Patrick Aguilar, male   DOB: [birth year], 67 y.o.   MRN: [id number]

## 2018-07-11 ENCOUNTER — Inpatient Hospital Stay (HOSPITAL_COMMUNITY): Payer: Medicare HMO | Admitting: Anesthesiology

## 2018-07-11 ENCOUNTER — Encounter (HOSPITAL_COMMUNITY)
Admission: EM | Disposition: A | Discharge status: Home / Self Care | Payer: Self-pay | Source: Home / Self Care | Attending: Urology

## 2018-07-11 ENCOUNTER — Encounter (HOSPITAL_COMMUNITY): Payer: Self-pay | Admitting: *Deleted

## 2018-07-11 DIAGNOSIS — E119 Type 2 diabetes mellitus without complications: Secondary | ICD-10-CM

## 2018-07-11 DIAGNOSIS — L03116 Cellulitis of left lower limb: Secondary | ICD-10-CM

## 2018-07-11 DIAGNOSIS — I10 Essential (primary) hypertension: Secondary | ICD-10-CM

## 2018-07-11 HISTORY — PX: INCISION AND DRAINAGE ABSCESS: SHX5864

## 2018-07-11 LAB — CBC
HEMATOCRIT: 38 % — AB (ref 39.0–52.0)
HEMOGLOBIN: 12.6 g/dL — AB (ref 13.0–17.0)
MCH: 28.6 pg (ref 26.0–34.0)
MCHC: 33.2 g/dL (ref 30.0–36.0)
MCV: 86.4 fL (ref 80.0–100.0)
Platelets: 258 10*3/uL (ref 150–400)
RBC: 4.4 MIL/uL (ref 4.22–5.81)
RDW: 13.8 % (ref 11.5–15.5)
WBC: 10.2 10*3/uL (ref 4.0–10.5)
nRBC: 0 % (ref 0.0–0.2)

## 2018-07-11 LAB — BASIC METABOLIC PANEL
ANION GAP: 8 (ref 5–15)
BUN: 10 mg/dL (ref 8–23)
CHLORIDE: 101 mmol/L (ref 98–111)
CO2: 25 mmol/L (ref 22–32)
CREATININE: 0.8 mg/dL (ref 0.61–1.24)
Calcium: 8.8 mg/dL — ABNORMAL LOW (ref 8.9–10.3)
GFR calc Af Amer: >60 mL/min (ref >60)
GFR calc non Af Amer: >60 mL/min (ref >60)
Glucose, Bld: 127 mg/dL — ABNORMAL HIGH (ref 70–99)
Potassium: 3.5 mmol/L (ref 3.5–5.1)
SODIUM: 134 mmol/L — AB (ref 135–145)

## 2018-07-11 LAB — GLUCOSE, CAPILLARY
Glucose-Capillary: 133 mg/dL — ABNORMAL HIGH (ref 70–99)
Glucose-Capillary: 126 mg/dL — ABNORMAL HIGH (ref 70–99)
Glucose-Capillary: 157 mg/dL — ABNORMAL HIGH (ref 70–99)
Glucose-Capillary: 214 mg/dL — ABNORMAL HIGH (ref 70–99)
Glucose-Capillary: 114 mg/dL — ABNORMAL HIGH (ref 70–99)

## 2018-07-11 SURGERY — INCISION AND DRAINAGE, ABSCESS
Anesthesia: General | Site: Thigh | Laterality: Left

## 2018-07-11 MED ORDER — VANCOMYCIN HCL IN DEXTROSE 1-5 GM/200ML-% IV SOLN
1000.0000 mg | Freq: Two times a day (BID) | INTRAVENOUS | Status: DC
  Administered 2018-07-11 – 2018-07-12 (×2): 1000 mg via INTRAVENOUS
  Filled 2018-07-11 (×3): qty 200

## 2018-07-11 MED ORDER — FENTANYL CITRATE (PF) 250 MCG/5ML IJ SOLN
INTRAMUSCULAR | Status: AC
  Filled 2018-07-11: qty 5

## 2018-07-11 MED ORDER — HYDROMORPHONE HCL 1 MG/ML IJ SOLN
0.2500 mg | INTRAMUSCULAR | Status: DC | PRN
  Administered 2018-07-11 (×2): 0.5 mg via INTRAVENOUS

## 2018-07-11 MED ORDER — FENTANYL CITRATE (PF) 100 MCG/2ML IJ SOLN
INTRAMUSCULAR | Status: DC | PRN
  Administered 2018-07-11 (×3): 50 ug via INTRAVENOUS

## 2018-07-11 MED ORDER — PROPOFOL 10 MG/ML IV BOLUS
INTRAVENOUS | Status: AC
  Filled 2018-07-11: qty 20

## 2018-07-11 MED ORDER — LIDOCAINE HCL (CARDIAC) PF 100 MG/5ML IV SOSY
PREFILLED_SYRINGE | INTRAVENOUS | Status: DC | PRN
  Administered 2018-07-11: 100 mg via INTRAVENOUS

## 2018-07-11 MED ORDER — FENTANYL CITRATE (PF) 100 MCG/2ML IJ SOLN: 25.0000 ug | INTRAMUSCULAR | Status: DC | PRN

## 2018-07-11 MED ORDER — ONDANSETRON HCL 4 MG/2ML IJ SOLN
INTRAMUSCULAR | Status: DC | PRN
  Administered 2018-07-11: 4 mg via INTRAVENOUS

## 2018-07-11 MED ORDER — HYDROMORPHONE HCL 1 MG/ML IJ SOLN
INTRAMUSCULAR | Status: AC
  Administered 2018-07-11: 0.5 mg via INTRAVENOUS
  Filled 2018-07-11: qty 1

## 2018-07-11 MED ORDER — LACTATED RINGERS IV SOLN
INTRAVENOUS | Status: DC | PRN
  Administered 2018-07-11: 13:00:00 via INTRAVENOUS

## 2018-07-11 MED ORDER — PROPOFOL 10 MG/ML IV BOLUS
INTRAVENOUS | Status: DC | PRN
  Administered 2018-07-11: 100 mg via INTRAVENOUS

## 2018-07-11 MED ORDER — ONDANSETRON HCL 4 MG/2ML IJ SOLN
INTRAMUSCULAR | Status: AC
  Filled 2018-07-11: qty 2

## 2018-07-11 MED ORDER — SODIUM CHLORIDE 0.9 % IV SOLN
INTRAVENOUS | Status: DC | PRN
  Administered 2018-07-11: 500 mL

## 2018-07-11 SURGICAL SUPPLY — 32 items
ADH SKN CLS APL DERMABOND .7 (GAUZE/BANDAGES/DRESSINGS)
BLADE HEX COATED 2.75 (ELECTRODE) ×1 IMPLANT
BNDG GAUZE ELAST 4 BULKY (GAUZE/BANDAGES/DRESSINGS) ×3 IMPLANT
BRIEF STRETCH FOR OB PAD LRG (UNDERPADS AND DIAPERS) ×2 IMPLANT
COVER SURGICAL LIGHT HANDLE (MISCELLANEOUS) ×3 IMPLANT
COVER WAND RF STERILE (DRAPES) ×2 IMPLANT
DERMABOND ADVANCED (GAUZE/BANDAGES/DRESSINGS)
DERMABOND ADVANCED .7 DNX12 (GAUZE/BANDAGES/DRESSINGS) ×1 IMPLANT
DRAIN PENROSE 18X1/4 LTX STRL (WOUND CARE) ×3 IMPLANT
DRAPE LAPAROTOMY T 98X78 PEDS (DRAPES) ×1 IMPLANT
ELECT PENCIL ROCKER SW 15FT (MISCELLANEOUS) ×2 IMPLANT
ELECT REM PT RETURN 15FT ADLT (MISCELLANEOUS) ×3 IMPLANT
GAUZE 4X4 16PLY RFD (DISPOSABLE) ×2 IMPLANT
GLOVE BIOGEL M 8.0 STRL (GLOVE) ×3 IMPLANT
GOWN STRL REUS W/TWL XL LVL3 (GOWN DISPOSABLE) ×5 IMPLANT
KIT BASIN OR (CUSTOM PROCEDURE TRAY) ×1 IMPLANT
NEEDLE HYPO 22GX1.5 SAFETY (NEEDLE) ×2 IMPLANT
NS IRRIG 1000ML POUR BTL (IV SOLUTION) ×1 IMPLANT
PACK CYSTO (CUSTOM PROCEDURE TRAY) ×2 IMPLANT
SUT CHROMIC 3 0 SH 27 (SUTURE) ×1 IMPLANT
SUT CHROMIC 4 0 SH 27 (SUTURE) IMPLANT
SUT MNCRL AB 4-0 PS2 18 (SUTURE) IMPLANT
SUT PROLENE 4 0 RB 1 (SUTURE) ×3
SUT PROLENE 4-0 RB1 .5 CRCL 36 (SUTURE) ×1 IMPLANT
SUT VIC AB 2-0 UR5 27 (SUTURE) IMPLANT
SUT VIC AB 4-0 PS2 27 (SUTURE) ×1 IMPLANT
SUT VICRYL 0 TIES 12 18 (SUTURE) ×1 IMPLANT
SWAB COLLECTION DEVICE MRSA (MISCELLANEOUS) ×2 IMPLANT
SWAB CULTURE ESWAB REG 1ML (MISCELLANEOUS) ×2 IMPLANT
SYR CONTROL 10ML LL (SYRINGE) IMPLANT
TOWEL OR 17X26 10 PK STRL BLUE (TOWEL DISPOSABLE) ×3 IMPLANT
WATER STERILE IRR 1000ML POUR (IV SOLUTION) ×1 IMPLANT

## 2018-07-11 NOTE — Anesthesia Preprocedure Evaluation (Addendum)
Anesthesia Evaluation  Patient identified by MRN, date of birth, ID band Patient awake    Reviewed: Allergy & Precautions, NPO status , Patient's Chart, lab work & pertinent test results, reviewed documented beta blocker date and time   Airway Mallampati: I  TM Distance: >3 FB Neck ROM: Full    Dental  (+) Teeth Intact, Dental Advisory Given, Poor Dentition   Pulmonary COPD, former smoker,    Pulmonary exam normal breath sounds clear to auscultation       Cardiovascular hypertension, Pt. on medications and Pt. on home beta blockers negative cardio ROS Normal cardiovascular exam Rhythm:Regular Rate:Normal  TTE 12/2017 - Left ventricle: The cavity size was normal. Wall thickness was normal. The estimated ejection fraction was 55%. Wall motion was normal; there were no regional wall motion abnormalities. Features are consistent with a pseudonormal left ventricular filling pattern, with concomitant abnormal relaxation and increased filling pressure (grade 2 diastolic dysfunction). - Aortic valve: There was no stenosis. - Mitral valve: Mildly calcified annulus. Mildly calcified leaflets. There was no significant regurgitation. - Right ventricle: The cavity size was mildly dilated. Systolic function was normal. - Tricuspid valve: Peak RV-RA gradient (S): 30 mm Hg. - Pulmonary arteries: PA peak pressure: 45 mm Hg (S). - Systemic veins: IVC measured 2.3 cm with < 50% respirophasic variation, suggesting RA pressure 15 mmHg. Impressions: - Normal LV size with EF 55%. Moderate diastolic dysfunction. Mildly dilated RV with normal systolic function. Mild pulmonary hypertension. Dilated IVC suggestive of elevated RV filling pressure.   Neuro/Psych PSYCHIATRIC DISORDERS Anxiety negative neurological ROS     GI/Hepatic Neg liver ROS, GERD  ,  Endo/Other  negative endocrine ROSdiabetes, Type 2, Insulin Dependent  Renal/GU negative Renal ROS   negative genitourinary   Musculoskeletal  (+) Arthritis , Osteoarthritis,    Abdominal   Peds  Hematology negative hematology ROS (+)   Anesthesia Other Findings Left thigh I&D  Reproductive/Obstetrics                           Anesthesia Physical Anesthesia Plan  ASA: III  Anesthesia Plan: General   Post-op Pain Management:    Induction: Intravenous  PONV Risk Score and Plan: 2 and Ondansetron, Midazolam and Treatment may vary due to age or medical condition  Airway Management Planned: Oral ETT and LMA  Additional Equipment:   Intra-op Plan:   Post-operative Plan: Extubation in OR  Informed Consent: I have reviewed the patients History and Physical, chart, labs and discussed the procedure including the risks, benefits and alternatives for the proposed anesthesia with the patient or authorized representative who has indicated his/her understanding and acceptance.   Dental advisory given  Plan Discussed with: CRNA  Anesthesia Plan Comments:         Anesthesia Quick Evaluation

## 2018-07-11 NOTE — Anesthesia Postprocedure Evaluation (Signed)
Anesthesia Post Note  Patient: Patrick Aguilar  Procedure(s) Performed: INCISION AND DRAINAGE ABSCESS LEFT THIGH (Left Thigh)     Patient location during evaluation: PACU Anesthesia Type: General Level of consciousness: awake and alert Pain management: pain level controlled Vital Signs Assessment: post-procedure vital signs reviewed and stable Respiratory status: spontaneous breathing, nonlabored ventilation, respiratory function stable and patient connected to nasal cannula oxygen Cardiovascular status: blood pressure returned to baseline and stable Postop Assessment: no apparent nausea or vomiting Anesthetic complications: no    Last Vitals:  Vitals:   07/11/18 1430 07/11/18 1453  BP:  (!) 154/89  Pulse:  (!) 58  Resp:  16  Temp: 36.5 C 36.6 C  SpO2:  97%    Last Pain:  Vitals:   07/11/18 1831  TempSrc:   PainSc: 7                  Exander Shaul L Doneisha Ivey

## 2018-07-11 NOTE — Anesthesia Procedure Notes (Signed)
Procedure Name: LMA Insertion Date/Time: 07/11/2018 1:30 PM Performed by: Thornell Mule, CRNA Pre-anesthesia Checklist: Patient identified, Emergency Drugs available, Suction available and Patient being monitored Patient Re-evaluated:Patient Re-evaluated prior to induction Oxygen Delivery Method: Circle system utilized Preoxygenation: Pre-oxygenation with 100% oxygen Induction Type: IV induction LMA: LMA inserted LMA Size: 4.0 Number of attempts: 1 Placement Confirmation: positive ETCO2 Tube secured with: Tape Dental Injury: Teeth and Oropharynx as per pre-operative assessment

## 2018-07-11 NOTE — Progress Notes (Signed)
Pharmacy Antibiotic Note  Patrick Aguilar is a 67 y.o. 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 Vancomycin, cefepime and clindamycin started on admission for L inner thigh cellulitis.  Today, 07/11/2018: - day #3 abx - afeb, wbc wnl - scr down 0.8 (crcl~95) - plan for I&D today  Plan: - Adjust vancomycin dose to 1000 mg IV q12h for est AUC 473 - continue cefepime 2gm IV q12 -  clindamycin 600 mg IV q8h  _____________________________________  Height: 5\' 8"  (172.7 cm) Weight: 186 lb (84.4 kg) IBW/kg (Calculated) : 68.4  Temp (24hrs), Avg:97.6 F (36.4 C), Min:97.5 F (36.4 C), Max:97.7 F (36.5 C)  Recent Labs  Lab 07/08/18 1528 07/08/18 1534 07/08/18 1727 07/09/18 0450 07/11/18 1031  WBC 11.7*  --   --  10.8* 10.2  CREATININE 1.21  --   --  1.14  --   LATICACIDVEN  --  2.52* 1.50  --   --     Estimated Creatinine Clearance: 66.5 mL/min (by C-G formula based on SCr of 1.14 mg/dL).    Allergies  Allergen Reactions  . Morphine And Related Itching    Antimicrobials this admission:  10/27 Cefepime>> 10/27 Vanc>> 10/27 Clinda>>  Dose adjustments this admission:  --  Microbiology results:  10/27 BCx x2:    Thank you for allowing pharmacy to be a part of this patient's care.  Lucia Gaskins 07/11/2018 10:56 AM

## 2018-07-11 NOTE — Progress Notes (Signed)
  Subjective: Patient reports still having pain in left thigh--perhaps a bit less. No F/C  Objective: Vital signs in last 24 hours: Temp:  [97.6 F (36.4 C)-97.7 F (36.5 C)] 97.7 F (36.5 C) (10/30 0440) Pulse Rate:  [55-68] 65 (10/30 0440) Resp:  [16-18] 18 (10/30 0440) BP: (147-188)/(84-97) 171/97 (10/30 0440) SpO2:  [92 %-97 %] 92 % (10/30 0440)  Intake/Output from previous day: 10/29 0701 - 10/30 0700 In: 3441.3 [P.O.:960; I.V.:1658.3; IV Piggyback:823] Out: 2525 [Urine:2525] Intake/Output this shift: Total I/O In: 2157.1 [P.O.:600; I.V.:986.1; IV Piggyback:571] Out: 1500 [Urine:1500]  Physical Exam:  Constitutional: Vital signs reviewed. WD WN in NAD   Eyes: PERRL, No scleral icterus.   Cardiovascular: RRR Pulmonary/Chest: Normal effort.  Exts:less induration overall. Now some fluctuance under/around thigh wound.  Lab Results: Recent Labs    07/08/18 1528 07/09/18 0450  HGB 13.0 12.1*  HCT 39.3 37.0*   BMET Recent Labs    07/08/18 1528 07/09/18 0450  NA 133* 137  K 3.8 3.3*  CL 100 104  CO2 24 25  GLUCOSE 219* 140*  BUN 26* 20  CREATININE 1.21 1.14  CALCIUM 9.0 8.7*   No results for input(s): LABPT, INR in the last 72 hours. No results for input(s): LABURIN in the last 72 hours. Results for orders placed or performed during the hospital encounter of 07/08/18  Blood culture (routine x 2)     Status: None (Preliminary result)   Collection Time: 07/08/18  3:28 PM  Result Value Ref Range Status   Specimen Description   Final    BLOOD BLOOD LEFT FOREARM Performed at [facility], [address], [place], [place] [address]    Special Requests   Final    BOTTLES DRAWN AEROBIC AND ANAEROBIC Blood Culture adequate volume Performed at [facility], [address], [place], [place] [address]    Culture   Final    NO GROWTH 2 DAYS Performed at [facility], [address] [place], [place] [address]    Report  Status PENDING  Incomplete  Blood culture (routine x 2)     Status: None (Preliminary result)   Collection Time: 07/08/18  3:43 PM  Result Value Ref Range Status   Specimen Description   Final    BLOOD RIGHT HAND Performed at [facility], [address], [place], [place] [address]    Special Requests   Final    BOTTLES DRAWN AEROBIC AND ANAEROBIC Blood Culture adequate volume Performed at [facility], [address], [place], [place] [address]    Culture   Final    NO GROWTH 2 DAYS Performed at [facility], [address], [place], [place] [address]    Report Status PENDING  Incomplete    Studies/Results: No results found.  Assessment/Plan:   Organizing infection in left thigh--I feel that there is now fluctuance that may be amenable to I&D  I will add him on to schedule later today for I&D  Hold lovenox   LOS: 3 days   Chelsea Aus 07/11/2018, 6:50 AM

## 2018-07-11 NOTE — Op Note (Signed)
Preoperative diagnosis: Left thigh abscess  Postoperative diagnosis: Same  Principal procedure: Incision and drainage of left thigh abscess  Surgeon: Amayra Kiedrowski  Anesthesia: General with LMA  Complications: None  Specimen: Culture of abscess  Indications: 67 year old male with recent presentation for with Fournier's gangrene.  The patient underwent wide debridement/scrotal excision with eventual wound closure and implantation of testicles in his thighs.  He underwent a recent left orchiectomy because of significant pain with the patient sitting down.  That surgery was approximately 3 weeks ago.  He was admitted over the weekend with a picture of cellulitis in his left groin.  He has been treated with antibiotics, and now this area has consolidated and he has fluctuance underneath his incision.  He presents at this time for incision and drainage.  I have described the procedure to the patient as well as risks and complications which include but are not limited to damage to the underlying vascular structures/nerve, bleeding, anesthetic complications.  I discussed the fact that we may need to leave a drain in.  He understands these and desires to proceed.  Description of procedure: The patient was properly identified in the holding area.  He was taken to the operating rating where general anesthetic was administered with the LMA.  He is placed in the dorsolithotomy position.  Genitalia and perineum/thigh were prepped and draped.  Proper timeout was performed.  A 1-1/2 cm incision was made directly over the old incision which had healed well.  This was carried down approximately 1 cm through subcutaneous tissue until purulent drainage was met.  A hemostat was used to gently open up the incision and it was passed laterally and medially to break up any loculations.  A significant amount of purulent matter was expressed/drained.  This was cultured both anaerobic and aerobic.  Following this, the abscess  cavity was copiously irrigated with antibiotic irrigation.  The edges of the incision were cauterized.  I then passed 1/4 inch Penrose drain into the abscess cavity.  This was sutured to the skin with 4-0 Prolene.  Dry sterile dressings and compressive undergarments were placed.  The patient was then awakened and taken to the PACU in stable condition.  He tolerated the procedure well.

## 2018-07-11 NOTE — Transfer of Care (Signed)
Immediate Anesthesia Transfer of Care Note  Patient: Patrick Aguilar  Procedure(s) Performed: INCISION AND DRAINAGE ABSCESS LEFT THIGH (Left Thigh)  Patient Location: PACU  Anesthesia Type:General  Level of Consciousness: awake, alert  and oriented  Airway & Oxygen Therapy: Patient Spontanous Breathing and Patient connected to face mask oxygen  Post-op Assessment: Report given to RN and Post -op Vital signs reviewed and stable  Post vital signs: Reviewed and stable  Last Vitals:  Vitals Value Taken Time  BP 147/89 07/11/2018  2:00 PM  Temp    Pulse 64 07/11/2018  2:00 PM  Resp 12 07/11/2018  2:00 PM  SpO2 99 % 07/11/2018  2:00 PM  Vitals shown include unvalidated device data.  Last Pain:  Vitals:   07/11/18 1300  TempSrc:   PainSc: 5       Patients Stated Pain Goal: 4 (07/11/18 1300)  Complications: No apparent anesthesia complications

## 2018-07-11 NOTE — Care Management Important Message (Signed)
Important Message  Patient Details  Name: NIKOLAS CASHER MRN: [id number] Date of Birth: Oct 16, 1950   Medicare Important Message Given:  Yes    Caren Macadam 07/11/2018, 12:02 PMImportant Message  Patient Details  Name: ASHAWN RINEHART MRN: [id number] Date of Birth: Nov 02, 1950   Medicare Important Message Given:  Yes    Caren Macadam 07/11/2018, 12:02 PM

## 2018-07-11 NOTE — Progress Notes (Signed)
[facility]  PROGRESS NOTE  Patrick Aguilar [name] DOB: Feb 07, 1951 DOA: 07/08/2018 PCP: Kirstie Peri, MD   Brief HPI:   67 year old male with a history of Fournier's  gangrene, including recent left orchiectomy with left testicle placed subcutaneously on the left thigh, uncontrolled diabetes mellitus, hypertension came to hospital with pain and erythema in the left medial thigh that started on day of admission.    Subjective   Patient denies any pain at this time.  Plan for incision and drainage of left thigh abscess.   Assessment/Plan:     1. Cellulitis of left leg-continue vancomycin, cefepime, clindamycin.  Blood cultures have been negative till date.  Plan for incision and drainage of left thigh.  Will follow abscess culture.  2. Hypertension-blood pressure stable, continue atenolol, chlorthalidone is on hold  3. Diabetes mellitus-Metformin on hold, continue sliding scale insulin.  Blood glucose stable.     CBG: Recent Labs  Lab 07/10/18 2208 07/11/18 0754 07/11/18 1208 07/11/18 1411 07/11/18 1638  GLUCAP 190* 133* 126* 157* 214*    CBC: Recent Labs  Lab 07/08/18 1528 07/09/18 0450 07/11/18 1031  WBC 11.7* 10.8* 10.2  NEUTROABS 8.7*  --   --   HGB 13.0 12.1* 12.6*  HCT 39.3 37.0* 38.0*  MCV 87.1 88.5 86.4  PLT 237 218 258    Basic Metabolic Panel: Recent Labs  Lab 07/08/18 1528 07/09/18 0450 07/11/18 1031  NA 133* 137 134*  K 3.8 3.3* 3.5  CL 100 104 101  CO2 24 25 25   GLUCOSE 219* 140* 127*  BUN 26* 20 10  CREATININE 1.21 1.14 0.80  CALCIUM 9.0 8.7* 8.8*  MG  --  1.8  --        Antibiotics:   Anti-infectives (From admission, onward)   Start     Dose/Rate Route Frequency Ordered Stop   07/11/18 1354  polymyxin B 500,000 Units, bacitracin 50,000 Units in sodium chloride 0.9 % 500 mL irrigation  Status:  Discontinued       As needed 07/11/18 1355 07/11/18 1401   07/11/18 1130  vancomycin (VANCOCIN) IVPB 1000 mg/200 mL  premix     1,000 mg 200 mL/hr over 60 Minutes Intravenous Every 12 hours 07/11/18 1112     07/09/18 1800  vancomycin (VANCOCIN) 1,250 mg in sodium chloride 0.9 % 250 mL IVPB  Status:  Discontinued     1,250 mg 166.7 mL/hr over 90 Minutes Intravenous Every 24 hours 07/08/18 1739 07/11/18 1112   07/09/18 0000  ceFEPIme (MAXIPIME) 2 g in sodium chloride 0.9 % 100 mL IVPB     2 g 200 mL/hr over 30 Minutes Intravenous Every 12 hours 07/08/18 1751     07/08/18 2200  clindamycin (CLEOCIN) IVPB 600 mg     600 mg 100 mL/hr over 30 Minutes Intravenous Every 8 hours 07/08/18 1814     07/08/18 1745  vancomycin (VANCOCIN) IVPB 1000 mg/200 mL premix     1,000 mg 200 mL/hr over 60 Minutes Intravenous  Once 07/08/18 1739 07/08/18 2007   07/08/18 1745  vancomycin (VANCOCIN) IVPB 1000 mg/200 mL premix  Status:  Discontinued     1,000 mg 200 mL/hr over 60 Minutes Intravenous  Once 07/08/18 1739 07/08/18 1740   07/08/18 1730  clindamycin (CLEOCIN) IVPB 600 mg  Status:  Discontinued     600 mg 100 mL/hr over 30 Minutes Intravenous  Once 07/08/18 1729 07/08/18 1901   07/08/18 1530  vancomycin (VANCOCIN) IVPB 1000 mg/200 mL premix  1,000 mg 200 mL/hr over 60 Minutes Intravenous  Once 07/08/18 1520 07/08/18 1719   07/08/18 1530  piperacillin-tazobactam (ZOSYN) IVPB 3.375 g     3.375 g 100 mL/hr over 30 Minutes Intravenous  Once 07/08/18 1520 07/08/18 1620       Objective   Vitals:   07/11/18 1400 07/11/18 1415 07/11/18 1430 07/11/18 1453  BP: (!) 147/89 (!) 162/106  (!) 154/89  Pulse: 63 62  (!) 58  Resp: 11 10  16   Temp: 97.7 F (36.5 C)  97.7 F (36.5 C) 97.8 F (36.6 C)  TempSrc:    Oral  SpO2: 99% 99%  97%  Weight:      Height:        Intake/Output Summary (Last 24 hours) at 07/11/2018 1907 Last data filed at 07/11/2018 1401 Gross per 24 hour  Intake 2657.11 ml  Output 2205 ml  Net 452.11 ml   Filed Weights   07/08/18 1444  Weight: 84.4 kg     Physical  Examination:    General: Appears in no acute distress  Cardiovascular: S1-S2, regular  Respiratory: Clear to auscultation bilaterally, no wheezing or crackles  Abdomen: Soft, nontender, no organomegaly  Extremities: No edema in the lower extremities  Neurologic: Alert, oriented x3, no focal deficit.     Data Reviewed: I have personally reviewed following labs and imaging studies   Recent Results (from the past 240 hour(s))  Blood culture (routine x 2)     Status: None (Preliminary result)   Collection Time: 07/08/18  3:28 PM  Result Value Ref Range Status   Specimen Description   Final    BLOOD BLOOD LEFT FOREARM Performed at [facility], [address] [place], [place] [address]    Special Requests   Final    BOTTLES DRAWN AEROBIC AND ANAEROBIC Blood Culture adequate volume Performed at [facility], [address], [place], [place] [address]    Culture   Final    NO GROWTH 3 DAYS Performed at [facility], [address], [place], [place] [address]    Report Status PENDING  Incomplete  Blood culture (routine x 2)     Status: None (Preliminary result)   Collection Time: 07/08/18  3:43 PM  Result Value Ref Range Status   Specimen Description   Final    BLOOD RIGHT HAND Performed at [facility], [address] [place], [place] [address]    Special Requests   Final    BOTTLES DRAWN AEROBIC AND ANAEROBIC Blood Culture adequate volume Performed at [facility], [address], [place], [place] [address]    Culture   Final    NO GROWTH 3 DAYS Performed at [facility], [address] [place], [place] [address]    Report Status PENDING  Incomplete     Liver Function Tests: No results for input(s): AST, ALT, ALKPHOS, BILITOT, PROT, ALBUMIN in the last 168 hours. No results for input(s): LIPASE, AMYLASE in the last 168 hours. No results for input(s): AMMONIA in the last 168  hours.  Cardiac Enzymes: No results for input(s): CKTOTAL, CKMB, CKMBINDEX, TROPONINI in the last 168 hours. BNP (last 3 results) No results for input(s): BNP in the last 8760 hours.  ProBNP (last 3 results) No results for input(s): PROBNP in the last 8760 hours.    Studies: No results found.  Scheduled Meds: . atenolol  50 mg Oral Daily  . diclofenac  75  mg Oral BID  . doxazosin  8 mg Oral Q breakfast  . famotidine  20 mg Oral Daily  . gabapentin  800 mg Oral QID  . insulin aspart  0-15 Units Subcutaneous TID WC  . insulin aspart  0-5 Units Subcutaneous QHS  . insulin aspart  3 Units Subcutaneous TID WC  . oxyCODONE-acetaminophen  1 tablet Oral Q6H   And  . oxyCODONE  5 mg Oral Q6H  . polyethylene glycol  17 g Oral Daily  . traZODone  100 mg Oral QHS      Time spent: 20 minutes  Meredeth Ide   [facility] Pager [phone]. If 7PM-7AM, please contact night-coverage at [email or web address], Office  [phone]  password [email or web address]  07/11/2018, 7:07 PM  LOS: 3 days

## 2018-07-12 ENCOUNTER — Encounter (HOSPITAL_COMMUNITY): Payer: Self-pay | Admitting: Urology

## 2018-07-12 LAB — GLUCOSE, CAPILLARY: GLUCOSE-CAPILLARY: 119 mg/dL — AB (ref 70–99)

## 2018-07-12 MED ORDER — DOXYCYCLINE HYCLATE 100 MG PO TABS: 100.0000 mg | ORAL_TABLET | Freq: Two times a day (BID) | ORAL | 0 refills | Status: DC

## 2018-07-12 NOTE — Progress Notes (Signed)
Pt discharged from the unit via wheelchair. Discharge instructions were reviewed with pt and family members. No questions or concerns at this time.  

## 2018-07-12 NOTE — Discharge Instructions (Signed)
It is okay to shower, but do not take tub baths  You will need to change the gauze over top of your drain.  This should be done at least once a day  Take all of the doxycycline  We will call you to set up a follow-up appointment.  You do not need to keep your appointment for Friday, November 1  If you have questions or concerns before your follow-up appointment, call [phone], prompt [phone]

## 2018-07-12 NOTE — Discharge Summary (Signed)
Patient ID: Patrick Aguilar MRN: [id number] DOB/AGE: 1951/03/10 67 y.o.  Admit date: 07/08/2018 Discharge date: 07/12/2018  Primary Care Physician:  Monico Blitz, MD  Discharge Diagnoses: Left thigh abscess Present on Admission: . Cellulitis of left leg . HTN (hypertension)   Consults: Urology   Discharge Medications: Allergies as of 07/12/2018      Reactions   Morphine And Related Itching      Medication List    TAKE these medications   albuterol (2.5 MG/3ML) 0.083% nebulizer solution Commonly known as:  PROVENTIL Inhale 2.5 mg into the lungs every 6 (six) hours as needed for shortness of breath or wheezing.   atenolol-chlorthalidone 50-25 MG tablet Commonly known as:  TENORETIC Take 1 tablet by mouth daily with breakfast.   blood glucose meter kit and supplies Kit Dispense based on patient and insurance preference. Use up to four times daily as directed. (FOR ICD-9 250.00, 250.01).   diclofenac 75 MG EC tablet Commonly known as:  VOLTAREN Take 75 mg by mouth 2 (two) times daily.   doxazosin 8 MG tablet Commonly known as:  CARDURA Take 8 mg by mouth daily with breakfast.   doxycycline 100 MG tablet Commonly known as:  VIBRA-TABS Take 1 tablet (100 mg total) by mouth 2 (two) times daily.   FLUAD 0.5 ML Susy Generic drug:  Influenza Vac A&B Surf Ant Adj Inject 0.5 mLs into the skin once. 05/02/2018   gabapentin 800 MG tablet Commonly known as:  NEURONTIN Take 800 mg by mouth 4 (four) times daily.   JUICE PLUS FIBRE PO Take 2 capsules by mouth daily.   metFORMIN 500 MG tablet Commonly known as:  GLUCOPHAGE Take 1 tablet (500 mg total) by mouth 2 (two) times daily with a meal. What changed:  how much to take   oxyCODONE-acetaminophen 10-325 MG tablet Commonly known as:  PERCOCET Take 1 tablet by mouth 4 (four) times daily.   polyethylene glycol packet Commonly known as:  MIRALAX / GLYCOLAX Take 17 g by mouth daily as needed for mild constipation.    potassium chloride SA 20 MEQ tablet Commonly known as:  K-DUR,KLOR-CON Take 20 mEq by mouth 2 (two) times daily.   ranitidine 150 MG tablet Commonly known as:  ZANTAC Take 150 mg by mouth 2 (two) times daily.   traMADol 50 MG tablet Commonly known as:  ULTRAM Take 1 tablet (50 mg total) by mouth every 6 (six) hours as needed.   traZODone 100 MG tablet Commonly known as:  DESYREL Take 100 mg by mouth at bedtime.        Significant Diagnostic Studies:  No results found.  Brief H and P: For complete details please refer to admission H and P, but in brief patient was admitted for management of cellulitis with associated abscess of his left thigh in the surgical area 3 weeks following left thigh approach to orchiectomy  Hospital Course: The patient was eventually taken to the operating room on 10.30.2019.  Prior to this, he was treated with broad-spectrum antibiotics.  The thigh abscess was drained, irrigated, and a drain was placed.  He had an uncomplicated postoperative course and was discharged on postoperative day #1. Active Problems:   HTN (hypertension)   Cellulitis of left leg   Type 2 diabetes mellitus without complication (HCC)   Day of Discharge BP (!) 176/95 (BP Location: Right Arm)   Pulse 66   Temp 98.5 F (36.9 C) (Oral)   Resp 16   Ht _0  (  1.727 m)   Wt 84.4 kg   SpO2 96%   BMI 28.28 kg/m   Results for orders placed or performed during the hospital encounter of 07/08/18 (from the past 24 hour(s))  CBC     Status: Abnormal   Collection Time: 07/11/18 10:31 AM  Result Value Ref Range   WBC 10.2 4.0 - 10.5 K/uL   RBC 4.40 4.22 - 5.81 MIL/uL   Hemoglobin 12.6 (L) 13.0 - 17.0 g/dL   HCT 38.0 (L) 39.0 - 52.0 %   MCV 86.4 80.0 - 100.0 fL   MCH 28.6 26.0 - 34.0 pg   MCHC 33.2 30.0 - 36.0 g/dL   RDW 13.8 11.5 - 15.5 %   Platelets 258 150 - 400 K/uL   nRBC 0.0 0.0 - 0.2 %  Basic metabolic panel     Status: Abnormal   Collection Time: 07/11/18 10:31 AM   Result Value Ref Range   Sodium 134 (L) 135 - 145 mmol/L   Potassium 3.5 3.5 - 5.1 mmol/L   Chloride 101 98 - 111 mmol/L   CO2 25 22 - 32 mmol/L   Glucose, Bld 127 (H) 70 - 99 mg/dL   BUN 10 8 - 23 mg/dL   Creatinine, Ser 0.80 0.61 - 1.24 mg/dL   Calcium 8.8 (L) 8.9 - 10.3 mg/dL   GFR calc non Af Amer >60 >60 mL/min   GFR calc Af Amer >60 >60 mL/min   Anion gap 8 5 - 15  Glucose, capillary     Status: Abnormal   Collection Time: 07/11/18 12:08 PM  Result Value Ref Range   Glucose-Capillary 126 (H) 70 - 99 mg/dL  Aerobic/Anaerobic Culture (surgical/deep wound)     Status: None (Preliminary result)   Collection Time: 07/11/18  1:53 PM  Result Value Ref Range   Specimen Description      ABSCESS LEFT THIGH Performed at [facility], [address], [place], [place] [address]    Special Requests      NONE Performed at [facility], [place] [address] [place], [place] [address]    Gram Stain      ABUNDANT WBC PRESENT, PREDOMINANTLY PMN MODERATE GRAM POSITIVE COCCI Performed at [facility], [place] [address] [place], [place] [address]    Culture PENDING    Report Status PENDING   Glucose, capillary     Status: Abnormal   Collection Time: 07/11/18  2:11 PM  Result Value Ref Range   Glucose-Capillary 157 (H) 70 - 99 mg/dL  Glucose, capillary     Status: Abnormal   Collection Time: 07/11/18  4:38 PM  Result Value Ref Range   Glucose-Capillary 214 (H) 70 - 99 mg/dL  Glucose, capillary     Status: Abnormal   Collection Time: 07/11/18 10:12 PM  Result Value Ref Range   Glucose-Capillary 114 (H) 70 - 99 mg/dL   Comment 1 Notify RN   Glucose, capillary     Status: Abnormal   Collection Time: 07/12/18  7:56 AM  Result Value Ref Range   Glucose-Capillary 119 (H) 70 - 99 mg/dL    Physical Exam: General: Alert and awake oriented x3 not in any acute distress. HEENT: anicteric sclera, pupils reactive to light and accommodation CVS: S1-S2  clear no murmur rubs or gallops Chest: clear to auscultation bilaterally, no wheezing rales or rhonchi Abdomen: soft nontender, nondistended, normal bowel sounds, no organomegaly Extremities: no cyanosis, clubbing or edema noted bilaterally Neuro: Cranial nerves II-XII  intact, no focal neurological deficits  Disposition: Home  Diet: Regular  Activity: Gradually increase   Disposition and Follow-up:  We will call for follow-up  TESTS THAT NEED FOLLOW-UP Reviewed culture results  DISCHARGE FOLLOW-UP   Time spent on Discharge: 15 minutes  Signed: Lillette Boxer Jamacia Jester 07/12/2018, 9:10 AM

## 2018-07-12 NOTE — Progress Notes (Signed)
[facility]  PROGRESS NOTE  Patrick Aguilar ZOX:[id number] DOB: 1950/11/28 DOA: 07/08/2018 PCP: Kirstie Peri, MD   Brief HPI:   67 year old male with a history of Fournier's  gangrene, including recent left orchiectomy with left testicle placed subcutaneously on the left thigh, uncontrolled diabetes mellitus, hypertension came to hospital with pain and erythema in the left medial thigh that started on day of admission.    Subjective   Patient seen and examined, status post incision and drainage of left thigh abscess yesterday.  Plan for discharge today.   Assessment/Plan:     1. Cellulitis of left leg-patient was started on  vancomycin, cefepime, clindamycin.  Blood cultures have been negative till date.  Plan for incision and drainage of left thigh.  Patient discharged on doxycycline, urology to follow culture results.  2. Hypertension-blood pressure stable, continue atenolol, chlorthalidone   3. Diabetes mellitus-continue metformin     CBG: Recent Labs  Lab 07/11/18 1208 07/11/18 1411 07/11/18 1638 07/11/18 2212 07/12/18 0756  GLUCAP 126* 157* 214* 114* 119*    CBC: Recent Labs  Lab 07/08/18 1528 07/09/18 0450 07/11/18 1031  WBC 11.7* 10.8* 10.2  NEUTROABS 8.7*  --   --   HGB 13.0 12.1* 12.6*  HCT 39.3 37.0* 38.0*  MCV 87.1 88.5 86.4  PLT 237 218 258    Basic Metabolic Panel: Recent Labs  Lab 07/08/18 1528 07/09/18 0450 07/11/18 1031  NA 133* 137 134*  K 3.8 3.3* 3.5  CL 100 104 101  CO2 24 25 25   GLUCOSE 219* 140* 127*  BUN 26* 20 10  CREATININE 1.21 1.14 0.80  CALCIUM 9.0 8.7* 8.8*  MG  --  1.8  --        Antibiotics:   Anti-infectives (From admission, onward)   Start     Dose/Rate Route Frequency Ordered Stop   07/12/18 0000  doxycycline (VIBRA-TABS) 100 MG tablet     100 mg Oral 2 times daily 07/12/18 0908     07/11/18 1354  polymyxin B 500,000 Units, bacitracin 50,000 Units in sodium chloride 0.9 % 500 mL irrigation  Status:   Discontinued       As needed 07/11/18 1355 07/11/18 1401   07/11/18 1130  vancomycin (VANCOCIN) IVPB 1000 mg/200 mL premix     1,000 mg 200 mL/hr over 60 Minutes Intravenous Every 12 hours 07/11/18 1112     07/09/18 1800  vancomycin (VANCOCIN) 1,250 mg in sodium chloride 0.9 % 250 mL IVPB  Status:  Discontinued     1,250 mg 166.7 mL/hr over 90 Minutes Intravenous Every 24 hours 07/08/18 1739 07/11/18 1112   07/09/18 0000  ceFEPIme (MAXIPIME) 2 g in sodium chloride 0.9 % 100 mL IVPB     2 g 200 mL/hr over 30 Minutes Intravenous Every 12 hours 07/08/18 1751     07/08/18 2200  clindamycin (CLEOCIN) IVPB 600 mg     600 mg 100 mL/hr over 30 Minutes Intravenous Every 8 hours 07/08/18 1814     07/08/18 1745  vancomycin (VANCOCIN) IVPB 1000 mg/200 mL premix     1,000 mg 200 mL/hr over 60 Minutes Intravenous  Once 07/08/18 1739 07/08/18 2007   07/08/18 1745  vancomycin (VANCOCIN) IVPB 1000 mg/200 mL premix  Status:  Discontinued     1,000 mg 200 mL/hr over 60 Minutes Intravenous  Once 07/08/18 1739 07/08/18 1740   07/08/18 1730  clindamycin (CLEOCIN) IVPB 600 mg  Status:  Discontinued     600 mg 100 mL/hr  over 30 Minutes Intravenous  Once 07/08/18 1729 07/08/18 1901   07/08/18 1530  vancomycin (VANCOCIN) IVPB 1000 mg/200 mL premix     1,000 mg 200 mL/hr over 60 Minutes Intravenous  Once 07/08/18 1520 07/08/18 1719   07/08/18 1530  piperacillin-tazobactam (ZOSYN) IVPB 3.375 g     3.375 g 100 mL/hr over 30 Minutes Intravenous  Once 07/08/18 1520 07/08/18 1620       Objective   Vitals:   07/11/18 1430 07/11/18 1453 07/11/18 2210 07/12/18 0613  BP:  (!) 154/89 (!) 176/94 (!) 176/95  Pulse:  (!) 58 (!) 59 66  Resp:  16 16 16   Temp: 97.7 F (36.5 C) 97.8 F (36.6 C) 98.4 F (36.9 C) 98.5 F (36.9 C)  TempSrc:  Oral Oral Oral  SpO2:  97% 92% 96%  Weight:      Height:        Intake/Output Summary (Last 24 hours) at 07/12/2018 1021 Last data filed at 07/12/2018 1610 Gross per 24  hour  Intake 2087.84 ml  Output 2055 ml  Net 32.84 ml   Filed Weights   07/08/18 1444  Weight: 84.4 kg     Physical Examination:   Mouth: Oral mucosa is moist, no lesions on palate,  Neck: Supple, no deformities, masses, or tenderness Lungs: Normal respiratory effort, bilateral clear to auscultation, no crackles or wheezes.  Heart: Regular rate and rhythm, S1 and S2 normal, no murmurs, rubs auscultated Abdomen: BS normoactive,soft,nondistended,non-tender to palpation,no organomegaly Extremities: No pretibial edema, no erythema, no cyanosis, no clubbing Neuro : Alert and oriented to time, place and person, No focal deficits      Data Reviewed: I have personally reviewed following labs and imaging studies   Recent Results (from the past 240 hour(s))  Blood culture (routine x 2)     Status: None (Preliminary result)   Collection Time: 07/08/18  3:28 PM  Result Value Ref Range Status   Specimen Description   Final    BLOOD BLOOD LEFT FOREARM Performed at [facility], [address] [place], [place] [address]    Special Requests   Final    BOTTLES DRAWN AEROBIC AND ANAEROBIC Blood Culture adequate volume Performed at [facility], [address] [place], [place] [address]    Culture   Final    NO GROWTH 3 DAYS Performed at [facility], [address], [place], [place] [address]    Report Status PENDING  Incomplete  Blood culture (routine x 2)     Status: None (Preliminary result)   Collection Time: 07/08/18  3:43 PM  Result Value Ref Range Status   Specimen Description   Final    BLOOD RIGHT HAND Performed at [facility], [address], [place], [place] [address]    Special Requests   Final    BOTTLES DRAWN AEROBIC AND ANAEROBIC Blood Culture adequate volume Performed at [facility], [address], [place], [place] [address]    Culture   Final    NO GROWTH 3 DAYS Performed  at [facility], [address] [place], [place] [address]    Report Status PENDING  Incomplete  Aerobic/Anaerobic Culture (surgical/deep wound)     Status: None (Preliminary result)   Collection Time: 07/11/18  1:53 PM  Result Value Ref Range Status   Specimen Description   Final    ABSCESS LEFT THIGH Performed at [facility], [address]  [place], [place] [address]    Special Requests   Final    NONE Performed at [facility], [address], [place], [place] [address]    Gram Stain   Final    ABUNDANT WBC PRESENT, PREDOMINANTLY PMN MODERATE GRAM POSITIVE COCCI Performed at [facility], [address] [place], [place] [address]    Culture PENDING  Incomplete   Report Status PENDING  Incomplete     Liver Function Tests: No results for input(s): AST, ALT, ALKPHOS, BILITOT, PROT, ALBUMIN in the last 168 hours. No results for input(s): LIPASE, AMYLASE in the last 168 hours. No results for input(s): AMMONIA in the last 168 hours.  Cardiac Enzymes: No results for input(s): CKTOTAL, CKMB, CKMBINDEX, TROPONINI in the last 168 hours. BNP (last 3 results) No results for input(s): BNP in the last 8760 hours.  ProBNP (last 3 results) No results for input(s): PROBNP in the last 8760 hours.    Studies: No results found.  Scheduled Meds: . atenolol  50 mg Oral Daily  . diclofenac  75 mg Oral BID  . doxazosin  8 mg Oral Q breakfast  . famotidine  20 mg Oral Daily  . gabapentin  800 mg Oral QID  . insulin aspart  0-15 Units Subcutaneous TID WC  . insulin aspart  0-5 Units Subcutaneous QHS  . insulin aspart  3 Units Subcutaneous TID WC  . oxyCODONE-acetaminophen  1 tablet Oral Q6H   And  . oxyCODONE  5 mg Oral Q6H  . polyethylene glycol  17 g Oral Daily  . traZODone  100 mg Oral QHS      Time spent: 20 minutes  Meredeth Ide   [facility] Pager [phone]. If 7PM-7AM, please contact night-coverage at  [email or web address], Office  [phone]  password [email or web address]  07/12/2018, 10:21 AM  LOS: 4 days

## 2018-07-13 LAB — CULTURE, BLOOD (ROUTINE X 2)
CULTURE: NO GROWTH
CULTURE: NO GROWTH
SPECIAL REQUESTS: ADEQUATE
Special Requests: ADEQUATE

## 2018-07-18 LAB — AEROBIC/ANAEROBIC CULTURE (SURGICAL/DEEP WOUND)

## 2018-07-18 LAB — AEROBIC/ANAEROBIC CULTURE W GRAM STAIN (SURGICAL/DEEP WOUND)

## 2018-09-25 ENCOUNTER — Encounter: Payer: Self-pay | Admitting: "Endocrinology

## 2019-01-02 IMAGING — US US SCROTUM
1 series · 14 of 25 positions shown · non-contrast
Comparison: 12/13/2017

CLINICAL DATA: Scrotal pain, recent scrotal removal for treatment
of Mauro Henrique gangrene

EXAM:
SCROTAL ULTRASOUND
DOPPLER ULTRASOUND OF THE TESTICLES
TECHNIQUE: Complete ultrasound examination of the testicles, epididymis, and
other scrotal structures was performed. Color and spectral Doppler
ultrasound were also utilized to evaluate blood flow to the
testicles.

[Series 1: us scrotum · 14 of 55 slices shown]
[im 1/55]
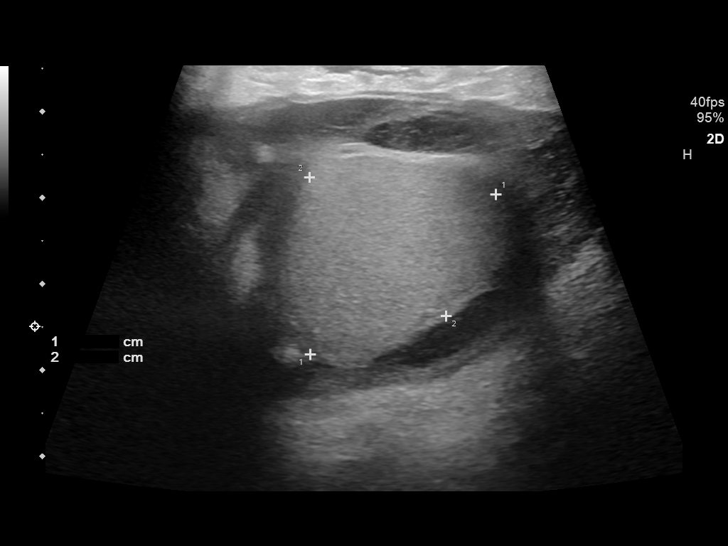
[im 5/55]
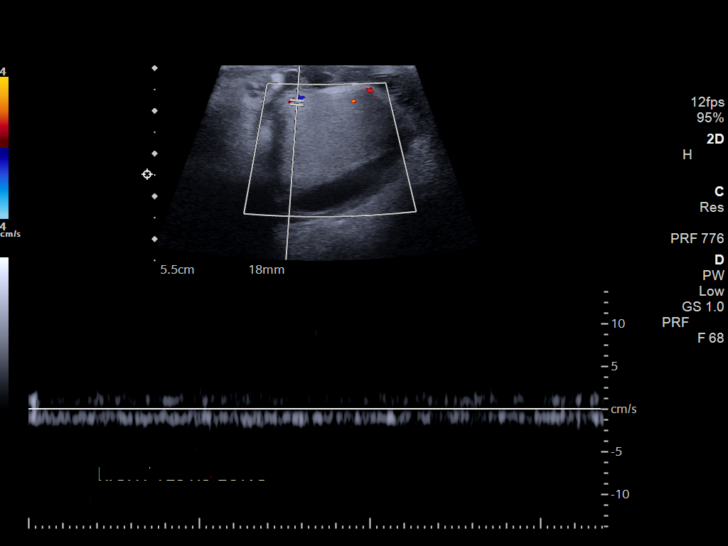
[im 10/55]
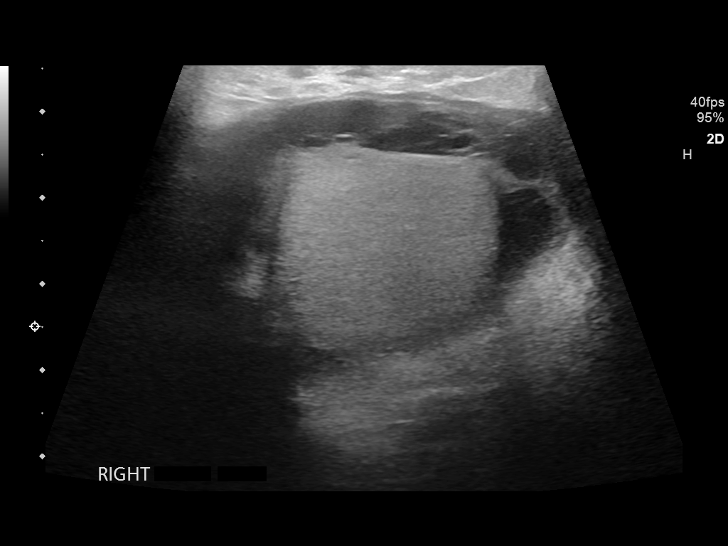
[im 14/55]
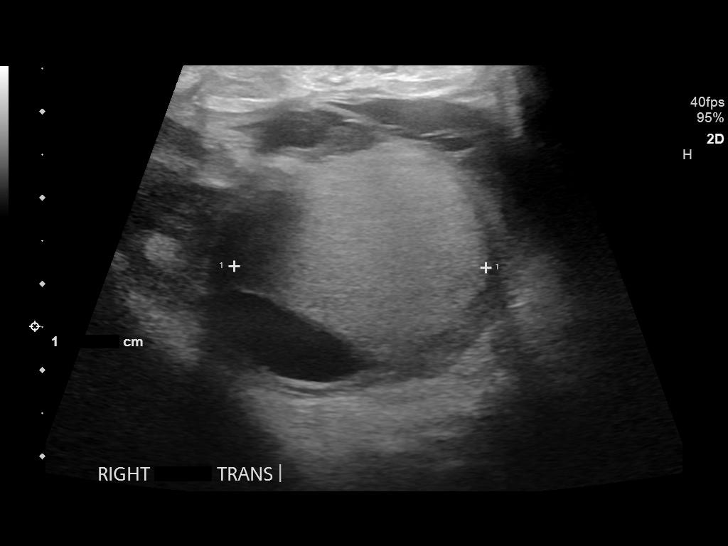
[im 19/55]
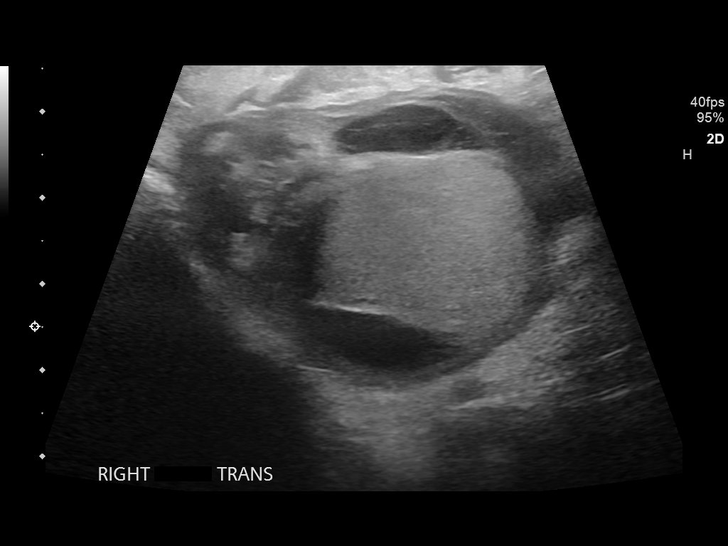
[im 21/55]
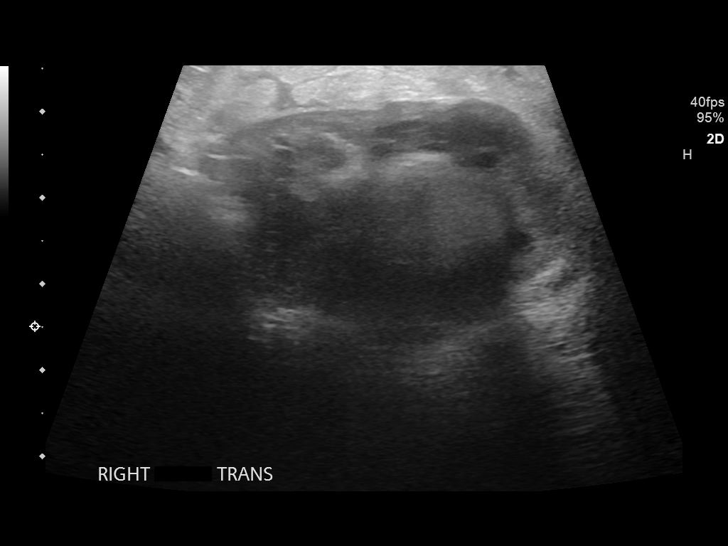
[im 25/55]
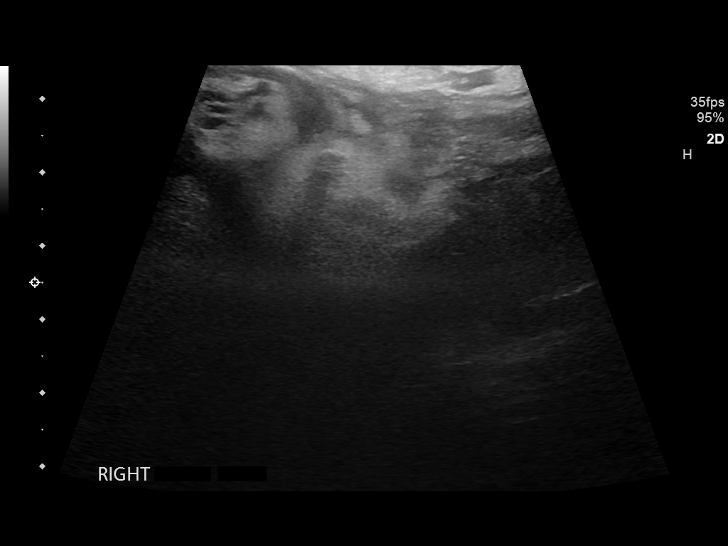
[im 30/55]
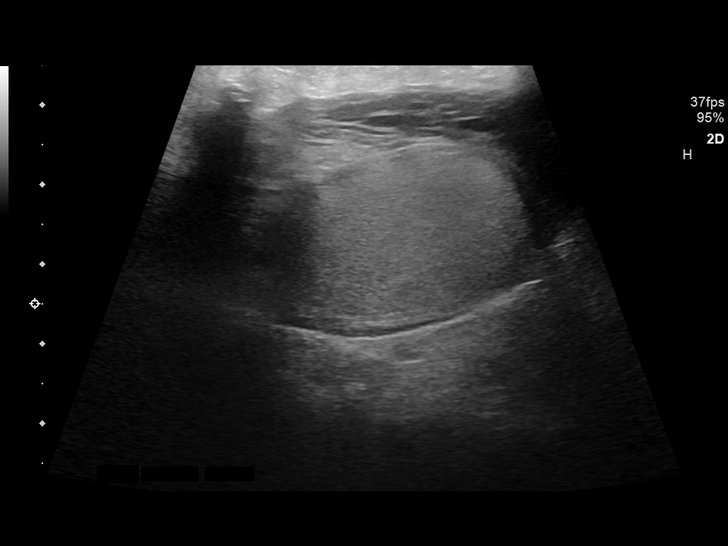
[im 34/55]
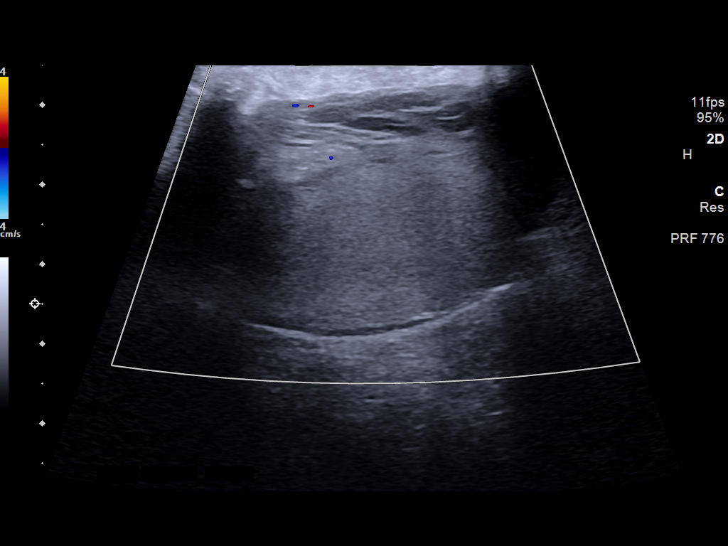
[im 37/55]
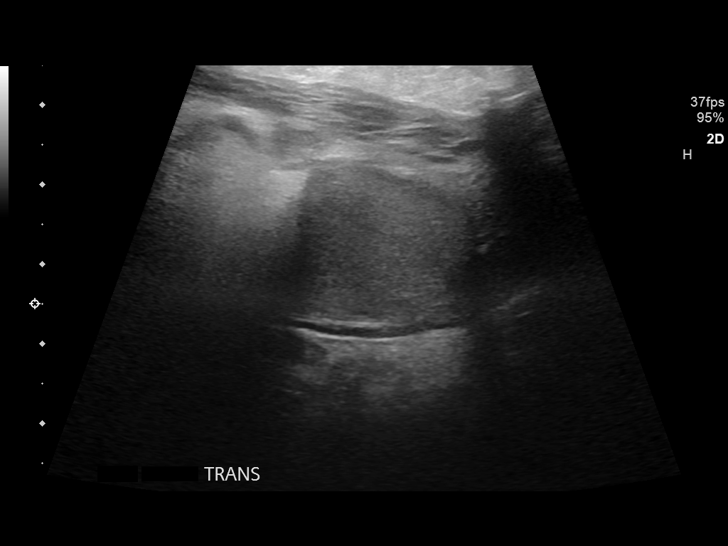
[im 41/55]
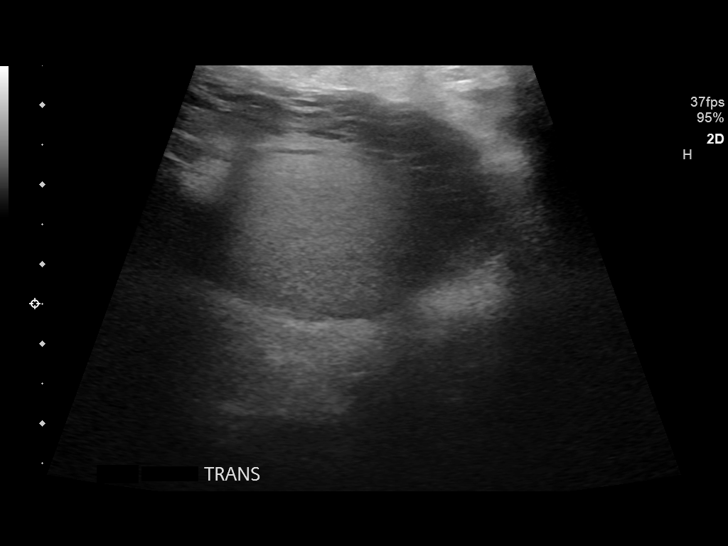
[im 46/55]
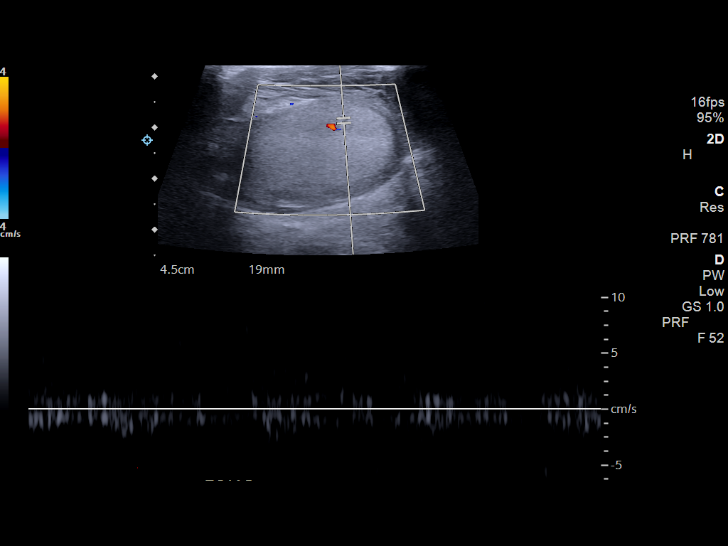
[im 50/55]
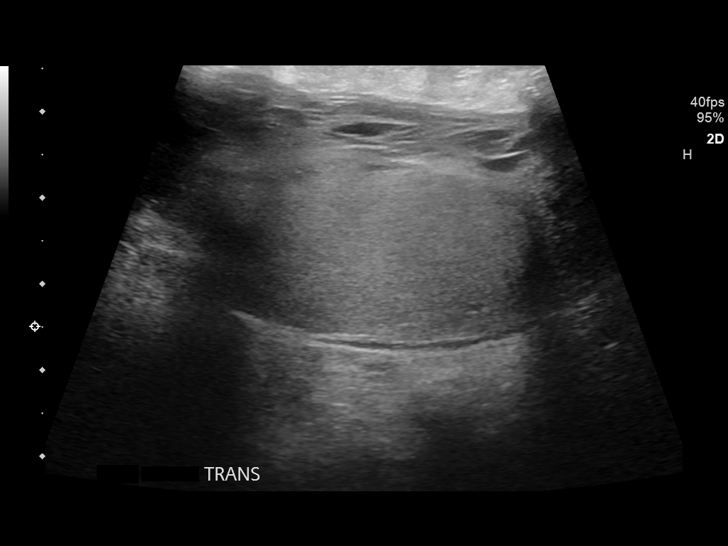
[im 55/55]
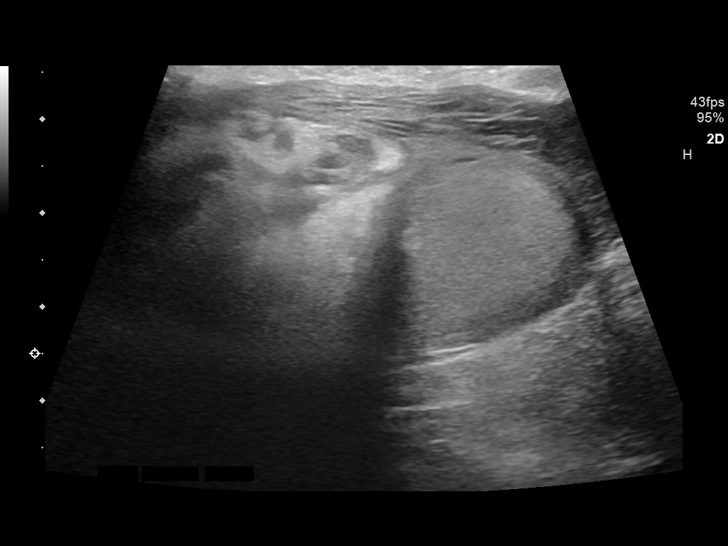

[14 of 25 positions shown; findings below may reference images not displayed]

FINDINGS: Right testicle

Measurements: 2.8 x 2.3 x 2.9 cm.. No mass or microlithiasis
visualized.

Left testicle

Measurements: 3.4 x 1.9 x 2.9 cm.. No mass or microlithiasis
visualized.

Right epididymis:  Not well visualized

Left epididymis:  Not well visualized

Hydrocele:  Not applicable

Varicocele:  No varicocele is noted.

Pulsed Doppler interrogation of both testes demonstrates normal low
resistance arterial and venous waveforms bilaterally.

Hypoechoic tissue is noted surrounding both testicles likely
representing some postoperative fluid.
IMPRESSION: Some fluid is noted surrounding the testicles bilaterally likely
related to the recent operative procedure. Normal waveforms are
noted in both testicles.

## 2019-02-06 ENCOUNTER — Inpatient Hospital Stay: Admit: 2019-02-06 | Payer: Medicare HMO | Admitting: Orthopedic Surgery

## 2019-02-06 SURGERY — ARTHROPLASTY, HIP, TOTAL, ANTERIOR APPROACH
Anesthesia: Choice | Laterality: Right

## 2019-02-13 NOTE — H&P (Signed)
TOTAL HIP ADMISSION H&P  Patient is admitted for right total hip arthroplasty.  Subjective:  Chief Complaint: right hip pain  HPI: Patrick Aguilar, 68 y.o. male, has a history of pain and functional disability in the right hip(s) due to arthritis and patient has failed non-surgical conservative treatments for greater than 12 weeks to include corticosteriod injections, use of assistive devices and activity modification.  Onset of symptoms was abrupt starting 1 years ago with gradually worsening course since that time.The patient noted no past surgery on the right hip(s).  Patient currently rates pain in the right hip at 8 out of 10 with activity. Patient has worsening of pain with activity and weight bearing, pain that interfers with activities of daily living and crepitus. 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 This condition presents safety issues increasing the risk of falls. There is no current active infection.  Patient Active Problem List   Diagnosis Date Noted  . Cellulitis of left leg 07/08/2018  . Type 2 diabetes mellitus without complication ([place]) 79/10/4095  . Non-healing left groin open wound, initial encounter 01/08/2018  . HTN (hypertension) 12/21/2017  . COPD (chronic obstructive pulmonary disease) ([place]) 12/21/2017  . Fournier gangrene   . Sialolithiasis of submandibular gland 03/12/2015   Past Medical History:  Diagnosis Date  . AKI (acute kidney injury) ([place]) 12/2017  . Anxiety   . Arthritis   . Complication of anesthesia    low respirations, low BP  . COPD (chronic obstructive pulmonary disease) ([place])   . DDD (degenerative disc disease), lumbar   . Diabetes mellitus without complication ([place])    type 2  . Diastolic dysfunction 35/32/9924   Moderate noted on ECHO  . Fournier's gangrene in male   . GERD (gastroesophageal reflux disease)   . History of ARDS   . History of  necrotizing fasciitis    Severe  . Hypertension   . Lumbar spondylosis   . Numbness and tingling of both upper extremities   . Pulmonary hypertension ([place]) 12/14/2017   Mild, noted on ECHO  . Respiratory failure, acute ([place]) 12/2017  . Septic shock ([place]) 12/2017  . Shortness of breath dyspnea   . Testicular pain, left   . Wears glasses     Past Surgical History:  Procedure Laterality Date  . APPENDECTOMY    . APPLICATION OF A-CELL OF CHEST/ABDOMEN N/A 01/08/2018   Procedure: APPLICATION OF A-CELL OF GROIN;  Surgeon: Wallace Going, DO;  Location: [facility];  Service: Plastics;  Laterality: N/A;  . APPLICATION OF A-CELL OF EXTREMITY N/A 12/25/2017   Procedure: APPLICATION OF A-CELL;  Surgeon: Wallace Going, DO;  Location: [facility];  Service: Plastics;  Laterality: N/A;  . BACK SURGERY     x5  . CARDIAC CATHETERIZATION  06   neg  . CERVICAL DISC SURGERY     x2  . COLONOSCOPY    . DEBRIDEMENT AND CLOSURE WOUND N/A 01/26/2018   Procedure: REVISION OF PERINEUM WOUND WITH DEBRIDEMENT, PARTIAL CLOSURE OF PERINEUM, PLACEMENT OF [place];  Surgeon: Wallace Going, DO;  Location: [facility];  Service: Plastics;  Laterality: N/A;  . DENTAL SURGERY     teeth extractions  . groin wound  01/08/2018   : EXCISION OF GROIN WOUND WITH PLACEMENT OF ACELL, AND PRIMARY WOUND CLOSURE (N/A Scrotum)  . I&D EXTREMITY N/A 03/12/2018   Procedure: IRRIGATION AND DEBRIDEMENT PERIMUM WOUND WITH CLOSURE;  Surgeon: Marla Roe,  Loel Lofty, DO;  Location: [facility];  Service: Plastics;  Laterality: N/A;  . INCISION AND DRAINAGE ABSCESS Left 07/11/2018   Procedure: INCISION AND DRAINAGE ABSCESS LEFT THIGH;  Surgeon: Franchot Gallo, MD;  Location: [facility];  Service: Urology;  Laterality: Left;  . INCISION AND DRAINAGE OF WOUND N/A 12/25/2017   Procedure: Irrigation and debridement of Fournier's of scrotum with placement of testes in subcutaneous thigh pockets and Acell placement;  Surgeon: Wallace Going, DO;  Location: [facility];  Service: Plastics;  Laterality: N/A;  . INCISION AND DRAINAGE OF WOUND N/A 01/08/2018   Procedure: EXCISION OF GROIN WOUND WITH PLACEMENT OF ACELL, AND PRIMARY WOUND CLOSURE;  Surgeon: Wallace Going, DO;  Location: [facility];  Service: Plastics;  Laterality: N/A;  . ORCHIECTOMY N/A 12/13/2017   Procedure: EXCISION OF SCROTUM AND DEBRIDEMENT OF PENIS;  Surgeon: Franchot Gallo, MD;  Location: [facility];  Service: Urology;  Laterality: N/A;  . ORCHIECTOMY Left 06/22/2018   Procedure: ORCHIECTOMY;  Surgeon: Franchot Gallo, MD;  Location: [facility];  Service: Urology;  Laterality: Left;  . PLANTAR FASCIA SURGERY Bilateral   . shoulders Bilateral    rotator cuff  . SUBMANDIBULAR GLAND EXCISION Left 03/12/2015   Procedure: LEFT SUBMANDIBULAR GLAND RESECTION;  Surgeon: Melida Quitter, MD;  Location: [facility];  Service: ENT;  Laterality: Left;  . TONSILLECTOMY      No current facility-administered medications for this encounter.    Current Outpatient Medications  Medication Sig Dispense Refill Last Dose  . albuterol (PROVENTIL) (2.5 MG/3ML) 0.083% nebulizer solution Inhale 2.5 mg into the lungs every 6 (six) hours as needed for shortness of breath or wheezing.   07/08/2018 at Unknown time  . atenolol-chlorthalidone (TENORETIC) 50-25 MG tablet Take 1 tablet by mouth daily with breakfast.    07/08/2018 at Unknown time  . blood glucose meter kit and supplies KIT Dispense based on patient and insurance preference. Use up to four times daily as directed. (FOR ICD-9 250.00, 250.01). 1 each 0   . diclofenac (VOLTAREN) 75 MG EC tablet Take 75 mg by mouth 2 (two) times daily.   07/08/2018 at Unknown time  . doxazosin (CARDURA) 8 MG tablet Take 8 mg by mouth daily with breakfast.    07/08/2018 at Unknown time  . doxycycline (VIBRA-TABS) 100 MG tablet Take 1 tablet (100 mg total) by mouth 2 (two) times daily. 10 tablet 0   . FLUAD 0.5 ML SUSY Inject 0.5 mLs into  the skin once. 05/02/2018  0   . gabapentin (NEURONTIN) 800 MG tablet Take 800 mg by mouth 4 (four) times daily.   1 07/08/2018 at Unknown time  . metFORMIN (GLUCOPHAGE) 500 MG tablet Take 1 tablet (500 mg total) by mouth 2 (two) times daily with a meal. (Patient taking differently: Take 1,000 mg by mouth 2 (two) times daily with a meal. ) 60 tablet 11 07/08/2018 at Unknown time  . Nutritional Supplements (JUICE PLUS FIBRE PO) Take 2 capsules by mouth daily.    07/08/2018 at Unknown time  . oxyCODONE-acetaminophen (PERCOCET) 10-325 MG tablet Take 1 tablet by mouth 4 (four) times daily.   07/08/2018 at Unknown time  . polyethylene glycol (MIRALAX / GLYCOLAX) packet Take 17 g by mouth daily as needed for mild constipation. 14 each 0 07/07/2018 at Unknown time  . potassium chloride SA (K-DUR,KLOR-CON) 20 MEQ tablet Take 20 mEq by mouth 2 (two) times daily.   1 07/08/2018 at Unknown time  . ranitidine (ZANTAC)  150 MG tablet Take 150 mg by mouth 2 (two) times daily.   0 07/08/2018 at Unknown time  . traMADol (ULTRAM) 50 MG tablet Take 1 tablet (50 mg total) by mouth every 6 (six) hours as needed. (Patient not taking: Reported on 07/08/2018) 10 tablet 0 has not started. at Unknown time  . traZODone (DESYREL) 100 MG tablet Take 100 mg by mouth at bedtime.    07/07/2018 at Unknown time   Allergies  Allergen Reactions  . Morphine And Related Itching    Social History   Tobacco Use  . Smoking status: Former Smoker    Packs/day: 1.00    Years: 50.00    Pack years: 50.00    Types: Cigarettes    Last attempt to quit: 12/13/2017    Years since quitting: 1.1  . Smokeless tobacco: Never Used  Substance Use Topics  . Alcohol use: No    Comment: quit alcohol 80's    Family History  Problem Relation Age of Onset  . Lung cancer Mother   . Bladder Cancer Father      Review of Systems  Constitutional: Negative for chills and fever.  HENT: Negative for congestion, sore throat and tinnitus.   Eyes:  Negative for double vision, photophobia and pain.  Respiratory: Negative for cough, shortness of breath and wheezing.   Cardiovascular: Negative for chest pain, palpitations and orthopnea.  Gastrointestinal: Negative for heartburn, nausea and vomiting.  Genitourinary: Negative for dysuria, frequency and urgency.  Musculoskeletal: Positive for joint pain.  Neurological: Negative for dizziness, weakness and headaches.    Objective:  Physical Exam  Well nourished and well developed.  General: Alert and oriented x3, cooperative and pleasant, no acute distress.  Head: normocephalic, atraumatic, neck supple.  Eyes: EOMI.  Respiratory: breath sounds clear in all fields, no wheezing, rales, or rhonchi. Cardiovascular: Regular rate and rhythm, no murmurs, gallops or rubs.  Abdomen: non-tender to palpation and soft, normoactive bowel sounds. Musculoskeletal:  Right Hip Exam: ROM: Flexion to 100, Internal Rotation 0, External Rotation 20, and abduction 20 with discomfort.  There is no tenderness over the greater trochanter bursa.  Calves soft and nontender. Motor function intact in LE. Strength 5/5 LE bilaterally. Neuro: Distal pulses 2+. Sensation to light touch intact in LE.  Vital signs in last 24 hours: Blood pressure: 114/76 mmHg Pulse: 80 bpm  Labs:   Estimated body mass index is 27.48 kg/m as calculated from the following:   Height as of 07/12/18: '5\' 9"'  (1.753 m).   Weight as of 07/12/18: 84.4 kg.   Imaging Review Plain radiographs demonstrate severe degenerative joint disease of the right hip(s). The bone quality appears to be adequate for age and reported activity level.   Assessment/Plan:  End stage arthritis, right hip(s)  The patient history, physical examination, clinical judgement of the provider and imaging studies are consistent with end stage degenerative joint disease of the right hip(s) and total hip arthroplasty is deemed medically necessary. The treatment  options including medical management, injection therapy, arthroscopy and arthroplasty were discussed at length. The risks and benefits of total hip arthroplasty were presented and reviewed. The risks due to aseptic loosening, infection, stiffness, dislocation/subluxation,  thromboembolic complications and other imponderables were discussed.  The patient acknowledged the explanation, agreed to proceed with the plan and consent was signed. Patient is being admitted for inpatient treatment for surgery, pain control, PT, OT, prophylactic antibiotics, VTE prophylaxis, progressive ambulation and ADL's and discharge planning.The patient is planning to be discharged  home.  Anticipated LOS equal to or greater than 2 midnights due to - Age 19 and older with one or more of the following:  - Obesity  - Expected need for hospital services (PT, OT, Nursing) required for safe  discharge  - Anticipated need for postoperative skilled nursing care or inpatient rehab  - Active co-morbidities: Diabetes and Respiratory Failure/COPD OR   - Unanticipated findings during/Post Surgery: None  - Patient is a high risk of re-admission due to: None  Therapy Plans: HEP Disposition: Home with daughter Planned DVT Prophylaxis: Aspirin 325 mg BID DME needed: None PCP: Monico Blitz, MD TXA: IV Allergies: Morphine (itching) Anesthesia Concerns: None BMI: 28.1 Last HgbA1c: 6.2% Other: Post-operative pain management will be complicated by chronic opioid use.   - Patient was instructed on what medications to stop prior to surgery. - Follow-up visit in 2 weeks with Dr. Wynelle Link - Begin physical therapy following surgery - Pre-operative lab work as pre-surgical testing - Prescriptions will be provided in hospital at time of discharge  Theresa Duty, PA-C Orthopedic Surgery [facility]

## 2019-02-28 ENCOUNTER — Other Ambulatory Visit (HOSPITAL_COMMUNITY): Payer: Self-pay | Admitting: *Deleted

## 2019-02-28 NOTE — Patient Instructions (Addendum)
Patrick Aguilar    Your procedure is scheduled on: 03-06-2019   [facility]  Entrance Report to admitting at 12:30 AM   YOU NEED TO HAVE A COVID 19 TEST ON_Sat_6/20_____ @_______ , THIS TEST MUST BE DONE BEFORE SURGERY, [facility].  ONCE YOUR COVID TEST IS COMPLETED, PLEASE BEGIN THE QUARANTINE INSTRUCTIONS AS OUTLINED IN YOUR HANDOUT.   Call this number if you have problems the morning of surgery [phone]     Remember: . BRUSH YOUR TEETH MORNING OF SURGERY AND RINSE YOUR MOUTH OUT, NO CHEWING GUM CANDY OR MINTS.   NO SOLID FOOD AFTER MIDNIGHT THE NIGHT PRIOR TO SURGERY.  NOTHING BY MOUTH EXCEPT CLEAR LIQUIDS UNTIL 12:00 pm . PLEASE FINISH G2 DRINK PER SURGEON  NEEDS TO BE COMPLETED AT 12:00 PM.   CLEAR LIQUID DIET   Foods Allowed                                                                     Foods Excluded  Coffee and tea, regular and decaf                             liquids that you cannot  Plain Jell-O in any flavor                                             see through such as: Fruit ices (not with fruit pulp)                                     milk, soups, orange juice  Iced Popsicles                                    All solid food Carbonated beverages, regular and diet                                    Cranberry, grape and apple juices Sports drinks like Gatorade Lightly seasoned clear broth or consume(fat free) Sugar, honey syrup      Take these medicines the morning of surgery with A SIP OF WATER : OXYCODONE IF NEEDED,  ALBUTEROL NEBULIZER,  GABAPENTIN (NEURONTIN), AMLODIPINE (NORVASC)   DO NOT TAKE ANY DIABETIC MEDICATIONS DAY OF YOUR SURGERY          How to Manage Your Diabetes Before and After Surgery  Why is it important to control my blood sugar before and after surgery? . Improving blood sugar levels before and after surgery helps healing and can limit  problems. . A way of improving blood sugar control is eating a healthy diet by: o  Eating less sugar and carbohydrates o  Increasing activity/exercise o  Talking with your doctor about reaching  your blood sugar goals . High blood sugars (greater than 180 mg/dL) can raise your risk of infections and slow your recovery, so you will need to focus on controlling your diabetes during the weeks before surgery. . Make sure that the doctor who takes care of your diabetes knows about your planned surgery including the date and location.  How do I manage my blood sugar before surgery? . Check your blood sugar at least 4 times a day, starting 2 days before surgery, to make sure that the level is not too high or low. o Check your blood sugar the morning of your surgery when you wake up and every 2 hours until you get to the Short Stay unit. . If your blood sugar is less than 70 mg/dL, you will need to treat for low blood sugar: o Do not take insulin. o Treat a low blood sugar (less than 70 mg/dL) with  cup of clear juice (cranberry or apple), 4 glucose tablets, OR glucose gel. o Recheck blood sugar in 15 minutes after treatment (to make sure it is greater than 70 mg/dL). If your blood sugar is not greater than 70 mg/dL on recheck, call [phone] for further instructions. . Report your blood sugar to the short stay nurse when you get to Short Stay.  . If you are admitted to the hospital after surgery: o Your blood sugar will be checked by the staff and you will probably be given insulin after surgery (instead of oral diabetes medicines) to make sure you have good blood sugar levels. o The goal for blood sugar control after surgery is 80-180 mg/dL.   WHAT DO I DO ABOUT MY DIABETES MEDICATION?  Marland Kitchen.   . THE DAY BEFORE SURGERY TAKE METFORMIN AS USUAL.      . THE MORNING OF SURGERY DO NOT TAKE METFORMIN                       You may not have any metal on your body including piercings      Do not  wear , lotions, powders or , deodorant                           Men may shave face and neck.   Do not bring valuables to the hospital. [facility] IS NOT             RESPONSIBLE   FOR VALUABLES.  Contacts, dentures or bridgework may not be worn into surgery.     [facility] - Preparing for Surgery  Before surgery, you can play an important role.   Because skin is not sterile, your skin needs to be as free of germs as possible .  You can reduce the number of germs on your skin by washing with CHG (chlorahexidine gluconate) soap before surgery .  CHG is an antiseptic cleaner which kills germs and bonds with the skin to continue killing germs even after washing. Please DO NOT use if you have an allergy to CHG or antibacterial soaps.   If your skin becomes reddened/irritated stop using the CHG and inform your nurse when you arrive at Short Stay. Do not shave (including legs and underarms) for at least 48 hours prior to the first CHG shower.   You may shave your face/neck.  Please follow these instructions carefully:   1.  Shower with CHG Soap the night before surgery and the  morning of  Surgery.  2.  If you choose to wash your hair, wash your hair first as usual with your  normal  shampoo.  3.  After you shampoo, rinse your hair and body thoroughly to remove the  shampoo.                                        4.  Use CHG as you would any other liquid soap.  You can apply chg directly  to the skin and wash                       Gently with a scrungie or clean washcloth.  5.  Apply the CHG Soap to your body ONLY FROM THE NECK DOWN.   Do not use on face/ open                           Wound or open sores. Avoid contact with eyes, ears mouth and genitals (private parts).                       Wash face,  Genitals (private parts) with your normal soap.             6.  Wash thoroughly, paying special attention to the area where your surgery  will be performed.  7.  Thoroughly rinse your body  with warm water from the neck down.  8.  DO NOT shower/wash with your normal soap after using and rinsing off  the CHG Soap.             9.  Pat yourself dry with a clean towel.            10.  Wear clean pajamas.            11.  Place clean sheets on your bed the night of your first shower and do not  sleep with pets.  Day of Surgery : Do not apply any lotions/deodorants the morning of surgery.  Please wear clean clothes to the hospital/surgery center.   FAILURE TO FOLLOW THESE INSTRUCTIONS MAY RESULT IN THE CANCELLATION OF YOUR SURGERY PATIENT SIGNATURE_________________________________  NURSE SIGNATURE__________________________________     Incentive Spirometer  An incentive spirometer is a tool that can help keep your lungs clear and active. This tool measures how well you are filling your lungs with each breath. Taking long deep breaths may help reverse or decrease the chance of developing breathing (pulmonary) problems (especially infection) following:  A long period of time when you are unable to move or be active. BEFORE THE PROCEDURE   If the spirometer includes an indicator to show your best effort, your nurse or respiratory therapist will set it to a desired goal.  If possible, sit up straight or lean slightly forward. Try not to slouch.  Hold the incentive spirometer in an upright position. INSTRUCTIONS FOR USE  1. Sit on the edge of your bed if possible, or sit up as far as you can in bed or on a chair. 2. Hold the incentive spirometer in an upright position. 3. Breathe out normally. 4. Place the mouthpiece in your mouth and seal your lips tightly around it. 5. Breathe in slowly and as deeply as possible, raising the piston or the ball toward the top of the column. 6. Hold your  breath for 3-5 seconds or for as long as possible. Allow the piston or ball to fall to the bottom of the column. 7. Remove the mouthpiece from your mouth and breathe out normally. 8. Rest for a  few seconds and repeat Steps 1 through 7 at least 10 times every 1-2 hours when you are awake. Take your time and take a few normal breaths between deep breaths. 9. The spirometer may include an indicator to show your best effort. Use the indicator as a goal to work toward during each repetition. 10. After each set of 10 deep breaths, practice coughing to be sure your lungs are clear. If you have an incision (the cut made at the time of surgery), support your incision when coughing by placing a pillow or rolled up towels firmly against it. Once you are able to get out of bed, walk around indoors and cough well. You may stop using the incentive spirometer when instructed by your caregiver.  RISKS AND COMPLICATIONS  Take your time so you do not get dizzy or light-headed.  If you are in pain, you may need to take or ask for pain medication before doing incentive spirometry. It is harder to take a deep breath if you are having pain. AFTER USE  Rest and breathe slowly and easily.  It can be helpful to keep track of a log of your progress. Your caregiver can provide you with a simple table to help with this. If you are using the spirometer at home, follow these instructions: [place] IF:   You are having difficultly using the spirometer.  You have trouble using the spirometer as often as instructed.  Your pain medication is not giving enough relief while using the spirometer.  You develop fever of 100.5 F (38.1 C) or higher. SEEK IMMEDIATE MEDICAL CARE IF:   You cough up bloody sputum that had not been present before.  You develop fever of 102 F (38.9 C) or greater.  You develop worsening pain at or near the incision site. MAKE SURE YOU:   Understand these instructions.  Will watch your condition.  Will get help right away if you are not doing well or get worse. Document Released: 01/09/2007 Document Revised: 11/21/2011 Document Reviewed: 03/12/2007 [facility] Patient  Information 2014 [place], [place].   ________________________________________________________________________

## 2019-02-28 NOTE — Progress Notes (Signed)
ECHO 12-14-17 EPIC

## 2019-03-01 ENCOUNTER — Other Ambulatory Visit: Payer: Self-pay

## 2019-03-01 ENCOUNTER — Encounter (HOSPITAL_COMMUNITY): Payer: Self-pay

## 2019-03-01 ENCOUNTER — Encounter (HOSPITAL_COMMUNITY)
Admission: RE | Admit: 2019-03-01 | Discharge: 2019-03-01 | Disposition: A | Discharge status: Home / Self Care | Payer: Medicare Other | Source: Ambulatory Visit | Attending: Orthopedic Surgery | Admitting: Orthopedic Surgery

## 2019-03-01 DIAGNOSIS — Z1159 Encounter for screening for other viral diseases: Secondary | ICD-10-CM | POA: Insufficient documentation

## 2019-03-01 DIAGNOSIS — Z01818 Encounter for other preprocedural examination: Secondary | ICD-10-CM | POA: Insufficient documentation

## 2019-03-01 DIAGNOSIS — E119 Type 2 diabetes mellitus without complications: Secondary | ICD-10-CM | POA: Diagnosis not present

## 2019-03-01 DIAGNOSIS — M1611 Unilateral primary osteoarthritis, right hip: Secondary | ICD-10-CM | POA: Diagnosis not present

## 2019-03-01 LAB — HEMOGLOBIN A1C
Hgb A1c MFr Bld: 6.2 % — ABNORMAL HIGH (ref 4.8–5.6)
Mean Plasma Glucose: 131.24 mg/dL

## 2019-03-01 LAB — COMPREHENSIVE METABOLIC PANEL
ALT: 28 U/L (ref 0–44)
AST: 21 U/L (ref 15–41)
Albumin: 4.2 g/dL (ref 3.5–5.0)
Alkaline Phosphatase: 82 U/L (ref 38–126)
Anion gap: 13 (ref 5–15)
BUN: 26 mg/dL — ABNORMAL HIGH (ref 8–23)
CO2: 27 mmol/L (ref 22–32)
Calcium: 9.3 mg/dL (ref 8.9–10.3)
Chloride: 100 mmol/L (ref 98–111)
Creatinine, Ser: 1.15 mg/dL (ref 0.61–1.24)
GFR calc Af Amer: >60 mL/min (ref >60)
GFR calc non Af Amer: >60 mL/min (ref >60)
Glucose, Bld: 131 mg/dL — ABNORMAL HIGH (ref 70–99)
Potassium: 3.8 mmol/L (ref 3.5–5.1)
Sodium: 140 mmol/L (ref 135–145)
Total Bilirubin: 0.3 mg/dL (ref 0.3–1.2)
Total Protein: 7.1 g/dL (ref 6.5–8.1)

## 2019-03-01 LAB — APTT: aPTT: 29 seconds (ref 24–36)

## 2019-03-01 LAB — GLUCOSE, CAPILLARY: Glucose-Capillary: 133 mg/dL — ABNORMAL HIGH (ref 70–99)

## 2019-03-01 LAB — SURGICAL PCR SCREEN
MRSA, PCR: NEGATIVE
Staphylococcus aureus: NEGATIVE

## 2019-03-01 LAB — CBC
HCT: 41.1 % (ref 39.0–52.0)
Hemoglobin: 13.8 g/dL (ref 13.0–17.0)
MCH: 30.7 pg (ref 26.0–34.0)
MCHC: 33.6 g/dL (ref 30.0–36.0)
MCV: 91.5 fL (ref 80.0–100.0)
Platelets: 275 10*3/uL (ref 150–400)
RBC: 4.49 MIL/uL (ref 4.22–5.81)
RDW: 13.4 % (ref 11.5–15.5)
WBC: 9.5 10*3/uL (ref 4.0–10.5)
nRBC: 0 % (ref 0.0–0.2)

## 2019-03-01 LAB — PROTIME-INR
INR: 0.9 (ref 0.8–1.2)
Prothrombin Time: 12 seconds (ref 11.4–15.2)

## 2019-03-01 NOTE — Progress Notes (Signed)
Konrad Felix PA   Pt has stopped his ASA per Dr. Ninfa Linden office Dr. Cathren Harsh him CXR,EKG, card. Clear and labs on chart

## 2019-03-02 ENCOUNTER — Other Ambulatory Visit (HOSPITAL_COMMUNITY)
Admission: RE | Admit: 2019-03-02 | Discharge: 2019-03-02 | Disposition: A | Discharge status: Home / Self Care | Payer: Medicare Other | Source: Ambulatory Visit | Attending: Orthopedic Surgery | Admitting: Orthopedic Surgery

## 2019-03-02 DIAGNOSIS — Z01818 Encounter for other preprocedural examination: Secondary | ICD-10-CM | POA: Diagnosis not present

## 2019-03-02 LAB — SARS CORONAVIRUS 2 (TAT 6-24 HRS): SARS Coronavirus 2: NEGATIVE

## 2019-03-02 LAB — ABO/RH: ABO/RH(D): B POS

## 2019-03-04 NOTE — Anesthesia Preprocedure Evaluation (Addendum)
Anesthesia Evaluation  Patient identified by MRN, date of birth, ID band Patient awake    Reviewed: Allergy & Precautions, NPO status , Patient's Chart, lab work & pertinent test results  Airway Mallampati: I  TM Distance: >3 FB Neck ROM: Full    Dental   Pulmonary former smoker,    Pulmonary exam normal        Cardiovascular hypertension, Pt. on medications Normal cardiovascular exam     Neuro/Psych Anxiety    GI/Hepatic GERD  Medicated and Controlled,  Endo/Other  diabetes, Type 2, Oral Hypoglycemic Agents  Renal/GU      Musculoskeletal   Abdominal   Peds  Hematology   Anesthesia Other Findings   Reproductive/Obstetrics                            Anesthesia Physical Anesthesia Plan  ASA: III  Anesthesia Plan: Spinal   Post-op Pain Management:    Induction: Intravenous  PONV Risk Score and Plan: 1 and Ondansetron and Treatment may vary due to age or medical condition  Airway Management Planned: Simple Face Mask  Additional Equipment:   Intra-op Plan:   Post-operative Plan:   Informed Consent: I have reviewed the patients History and Physical, chart, labs and discussed the procedure including the risks, benefits and alternatives for the proposed anesthesia with the patient or authorized representative who has indicated his/her understanding and acceptance.       Plan Discussed with: CRNA and Surgeon  Anesthesia Plan Comments: (See PAT note 03/01/2019, Konrad Felix, PA-C)       Anesthesia Quick Evaluation

## 2019-03-04 NOTE — Progress Notes (Cosign Needed)
Anesthesia Chart Review   Case: [id number] Date/Time: 03/06/19 1443   Procedure: TOTAL HIP ARTHROPLASTY ANTERIOR APPROACH (Right ) - 137mn   Anesthesia type: Choice   Pre-op diagnosis: right hip osteoarthritis   [facility] / [facility]   Surgeon: AGaynelle Arabian MD      DISCUSSION: 68yo former smoker (50 pack years, quit 12/13/17) with h/o anxiety, DM II, COPD, GERD, HTN, right hip OA scheduled for above procedure 03/06/2019 with Dr. FGaynelle Arabian   Echo 12/14/17 Normal LV size with EF 55%. Moderate diastolic dysfunction.  Mildly dilated RV with normal systolic function. Mild pulmonary hypertension. Dilated IVC suggestive of elevated RV filling pressure.  S/p left hip I&D under general anesthesia 07/11/18 with no anesthesia complications noted.    Anticipate pt can proceed with planned procedure barring acute status change.  VS: BP (!) 120/54   Pulse 68   Temp 37 C   Resp 18   Ht _0  (1.727 m)   Wt 78 kg   BMI 26.15 kg/m   PROVIDERS: SMonico Blitz MD is PCP    LABS: Labs reviewed: Acceptable for surgery. (all labs ordered are listed, but only abnormal results are displayed)  Labs Reviewed  GLUCOSE, CAPILLARY - Abnormal; Notable for the following components:      Result Value   Glucose-Capillary 133 (*)    All other components within normal limits  COMPREHENSIVE METABOLIC PANEL - Abnormal; Notable for the following components:   Glucose, Bld 131 (*)    BUN 26 (*)    All other components within normal limits  HEMOGLOBIN A1C - Abnormal; Notable for the following components:   Hgb A1c MFr Bld 6.2 (*)    All other components within normal limits  SURGICAL PCR SCREEN  APTT  CBC  PROTIME-INR  TYPE AND SCREEN  ABO/RH     IMAGES:   EKG: 03/01/2019 Rate 63 bpm Normal sinus rhythm  Normal ECG   CV: Echo 12/14/17 Study Conclusions  - Left ventricle: The cavity size was normal. Wall thickness was   normal. The estimated ejection fraction was 55%. Wall motion  was   normal; there were no regional wall motion abnormalities.   Features are consistent with a pseudonormal left ventricular   filling pattern, with concomitant abnormal relaxation and   increased filling pressure (grade 2 diastolic dysfunction). - Aortic valve: There was no stenosis. - Mitral valve: Mildly calcified annulus. Mildly calcified leaflets   . There was no significant regurgitation. - Right ventricle: The cavity size was mildly dilated. Systolic   function was normal. - Tricuspid valve: Peak RV-RA gradient (S): 30 mm Hg. - Pulmonary arteries: PA peak pressure: 45 mm Hg (S). - Systemic veins: IVC measured 2.3 cm with < 50% respirophasic   variation, suggesting RA pressure 15 mmHg.  Impressions:  - Normal LV size with EF 55%. Moderate diastolic dysfunction.   Mildly dilated RV with normal systolic function. Mild pulmonary   hypertension. Dilated IVC suggestive of elevated RV filling   pressure. Past Medical History:  Diagnosis Date  . AKI (acute kidney injury) ([place] 12/2017  . Anxiety   . Arthritis   . Complication of anesthesia    low respirations, low BP  . COPD (chronic obstructive pulmonary disease) ([place]   . DDD (degenerative disc disease), lumbar   . Diabetes mellitus without complication ([place]    type 2  . Diastolic dysfunction 038/06/1750  Moderate noted on ECHO  . Fournier's gangrene in male   .  GERD (gastroesophageal reflux disease)   . History of ARDS   . History of necrotizing fasciitis    Severe  . Hypertension   . Lumbar spondylosis   . Numbness and tingling of both upper extremities   . Pulmonary hypertension ([place]) 12/14/2017   Mild, noted on ECHO  . Respiratory failure, acute ([place]) 12/2017  . Septic shock ([place]) 12/2017  . Shortness of breath dyspnea   . Testicular pain, left   . Wears glasses     Past Surgical History:  Procedure Laterality Date  . APPENDECTOMY    . APPLICATION OF A-CELL OF CHEST/ABDOMEN N/A 01/08/2018   Procedure:  APPLICATION OF A-CELL OF GROIN;  Surgeon: Wallace Going, DO;  Location: [facility];  Service: Plastics;  Laterality: N/A;  . APPLICATION OF A-CELL OF EXTREMITY N/A 12/25/2017   Procedure: APPLICATION OF A-CELL;  Surgeon: Wallace Going, DO;  [facility];  Service: Plastics;  Laterality: N/A;  . BACK SURGERY     x5  . CARDIAC CATHETERIZATION  06   neg  . CERVICAL DISC SURGERY     x2  . COLONOSCOPY    . DEBRIDEMENT AND CLOSURE WOUND N/A 01/26/2018   Procedure: REVISION OF PERINEUM WOUND WITH DEBRIDEMENT, PARTIAL CLOSURE OF PERINEUM, PLACEMENT OF [place];  Surgeon: Wallace Going, DO;  [facility];  Service: Plastics;  Laterality: N/A;  . DENTAL SURGERY     teeth extractions  . groin wound  01/08/2018   : EXCISION OF GROIN WOUND WITH PLACEMENT OF ACELL, AND PRIMARY WOUND CLOSURE (N/A Scrotum)  . I&D EXTREMITY N/A 03/12/2018   Procedure: IRRIGATION AND DEBRIDEMENT PERIMUM WOUND WITH CLOSURE;  Surgeon: Wallace Going, DO;  Location: [facility];  Service: Plastics;  Laterality: N/A;  . INCISION AND DRAINAGE ABSCESS Left 07/11/2018   Procedure: INCISION AND DRAINAGE ABSCESS LEFT THIGH;  Surgeon: Franchot Gallo, MD;  [facility];  Service: Urology;  Laterality: Left;  . INCISION AND DRAINAGE OF WOUND N/A 12/25/2017   Procedure: Irrigation and debridement of Fournier's of scrotum with placement of testes in subcutaneous thigh pockets and Acell placement;  Surgeon: Wallace Going, DO;  [facility];  Service: Plastics;  Laterality: N/A;  . INCISION AND DRAINAGE OF WOUND N/A 01/08/2018   Procedure: EXCISION OF GROIN WOUND WITH PLACEMENT OF ACELL, AND PRIMARY WOUND CLOSURE;  Surgeon: Wallace Going, DO;  Location: [facility];  Service: Plastics;  Laterality: N/A;  . ORCHIECTOMY N/A 12/13/2017   Procedure: EXCISION OF SCROTUM AND DEBRIDEMENT OF PENIS;  Surgeon: Franchot Gallo, MD;  [facility];  Service: Urology;  Laterality: N/A;  . ORCHIECTOMY  Left 06/22/2018   Procedure: ORCHIECTOMY;  Surgeon: Franchot Gallo, MD;  [facility];  Service: Urology;  Laterality: Left;  . PLANTAR FASCIA SURGERY Bilateral   . shoulders Bilateral    rotator cuff  . SUBMANDIBULAR GLAND EXCISION Left 03/12/2015   Procedure: LEFT SUBMANDIBULAR GLAND RESECTION;  Surgeon: Melida Quitter, MD;  Location: [facility];  Service: ENT;  Laterality: Left;  . TONSILLECTOMY      MEDICATIONS: . albuterol (PROVENTIL) (2.5 MG/3ML) 0.083% nebulizer solution  . amLODipine (NORVASC) 5 MG tablet  . atenolol-chlorthalidone (TENORETIC) 50-25 MG tablet  . blood glucose meter kit and supplies KIT  . diclofenac (VOLTAREN) 75 MG EC tablet  . doxazosin (CARDURA) 8 MG tablet  . doxycycline (VIBRA-TABS) 100 MG tablet  . furosemide (LASIX) 20 MG tablet  . gabapentin (NEURONTIN) 800 MG tablet  .  metFORMIN (GLUCOPHAGE) 500 MG tablet  . Nutritional Supplements (JUICE PLUS FIBRE PO)  . oxyCODONE-acetaminophen (PERCOCET) 10-325 MG tablet  . polyethylene glycol (MIRALAX / GLYCOLAX) packet  . potassium chloride SA (K-DUR,KLOR-CON) 20 MEQ tablet  . traMADol (ULTRAM) 50 MG tablet  . traZODone (DESYREL) 100 MG tablet   No current facility-administered medications for this encounter.     Maia Plan [facility]-Surgical Testing [phone] 03/04/19 4:28 PM

## 2019-03-06 ENCOUNTER — Encounter (HOSPITAL_COMMUNITY): Payer: Self-pay | Admitting: *Deleted

## 2019-03-06 ENCOUNTER — Inpatient Hospital Stay (HOSPITAL_COMMUNITY): Payer: Medicare Other

## 2019-03-06 ENCOUNTER — Other Ambulatory Visit: Payer: Self-pay

## 2019-03-06 ENCOUNTER — Inpatient Hospital Stay (HOSPITAL_COMMUNITY): Payer: Medicare Other | Admitting: Certified Registered Nurse Anesthetist

## 2019-03-06 ENCOUNTER — Inpatient Hospital Stay (HOSPITAL_COMMUNITY): Payer: Medicare Other | Admitting: Physician Assistant

## 2019-03-06 ENCOUNTER — Encounter (HOSPITAL_COMMUNITY)
Admission: RE | Disposition: A | Discharge status: Home Health | Payer: Self-pay | Source: Home / Self Care | Attending: Orthopedic Surgery

## 2019-03-06 ENCOUNTER — Inpatient Hospital Stay (HOSPITAL_COMMUNITY)
Admission: RE | Admit: 2019-03-06 | Discharge: 2019-03-08 | DRG: 470 | Disposition: A | Discharge status: Home Health | Payer: Medicare Other | Attending: Orthopedic Surgery | Admitting: Orthopedic Surgery

## 2019-03-06 DIAGNOSIS — I10 Essential (primary) hypertension: Secondary | ICD-10-CM | POA: Diagnosis present

## 2019-03-06 DIAGNOSIS — M1611 Unilateral primary osteoarthritis, right hip: Secondary | ICD-10-CM | POA: Diagnosis present

## 2019-03-06 DIAGNOSIS — Z885 Allergy status to narcotic agent status: Secondary | ICD-10-CM

## 2019-03-06 DIAGNOSIS — Z87891 Personal history of nicotine dependence: Secondary | ICD-10-CM

## 2019-03-06 DIAGNOSIS — K219 Gastro-esophageal reflux disease without esophagitis: Secondary | ICD-10-CM | POA: Diagnosis present

## 2019-03-06 DIAGNOSIS — E119 Type 2 diabetes mellitus without complications: Secondary | ICD-10-CM | POA: Diagnosis present

## 2019-03-06 DIAGNOSIS — M25751 Osteophyte, right hip: Secondary | ICD-10-CM | POA: Diagnosis present

## 2019-03-06 DIAGNOSIS — M25551 Pain in right hip: Secondary | ICD-10-CM | POA: Diagnosis present

## 2019-03-06 DIAGNOSIS — I272 Pulmonary hypertension, unspecified: Secondary | ICD-10-CM | POA: Diagnosis present

## 2019-03-06 DIAGNOSIS — J449 Chronic obstructive pulmonary disease, unspecified: Secondary | ICD-10-CM | POA: Diagnosis present

## 2019-03-06 DIAGNOSIS — Z6828 Body mass index (BMI) 28.0-28.9, adult: Secondary | ICD-10-CM

## 2019-03-06 DIAGNOSIS — E669 Obesity, unspecified: Secondary | ICD-10-CM | POA: Diagnosis present

## 2019-03-06 DIAGNOSIS — Z79891 Long term (current) use of opiate analgesic: Secondary | ICD-10-CM | POA: Diagnosis not present

## 2019-03-06 DIAGNOSIS — Z96649 Presence of unspecified artificial hip joint: Secondary | ICD-10-CM

## 2019-03-06 DIAGNOSIS — Z8739 Personal history of other diseases of the musculoskeletal system and connective tissue: Secondary | ICD-10-CM

## 2019-03-06 DIAGNOSIS — Z79899 Other long term (current) drug therapy: Secondary | ICD-10-CM | POA: Diagnosis not present

## 2019-03-06 DIAGNOSIS — Z7984 Long term (current) use of oral hypoglycemic drugs: Secondary | ICD-10-CM

## 2019-03-06 DIAGNOSIS — M169 Osteoarthritis of hip, unspecified: Secondary | ICD-10-CM | POA: Diagnosis present

## 2019-03-06 HISTORY — PX: TOTAL HIP ARTHROPLASTY: SHX124

## 2019-03-06 LAB — TYPE AND SCREEN
ABO/RH(D): B POS
Antibody Screen: NEGATIVE

## 2019-03-06 LAB — GLUCOSE, CAPILLARY
Glucose-Capillary: 143 mg/dL — ABNORMAL HIGH (ref 70–99)
Glucose-Capillary: 162 mg/dL — ABNORMAL HIGH (ref 70–99)
Glucose-Capillary: 206 mg/dL — ABNORMAL HIGH (ref 70–99)
Glucose-Capillary: 190 mg/dL — ABNORMAL HIGH (ref 70–99)

## 2019-03-06 SURGERY — ARTHROPLASTY, HIP, TOTAL, ANTERIOR APPROACH
Anesthesia: Spinal | Site: Hip | Laterality: Right

## 2019-03-06 MED ORDER — TRAZODONE HCL 100 MG PO TABS
100.0000 mg | ORAL_TABLET | Freq: Every day | ORAL | Status: DC
  Administered 2019-03-06 – 2019-03-07 (×2): 100 mg via ORAL
  Filled 2019-03-06 (×2): qty 1

## 2019-03-06 MED ORDER — METOCLOPRAMIDE HCL 5 MG/ML IJ SOLN: 5.0000 mg | Freq: Three times a day (TID) | INTRAMUSCULAR | Status: DC | PRN

## 2019-03-06 MED ORDER — HYDROMORPHONE HCL 1 MG/ML IJ SOLN
INTRAMUSCULAR | Status: AC
  Filled 2019-03-06: qty 1

## 2019-03-06 MED ORDER — ALBUTEROL SULFATE (2.5 MG/3ML) 0.083% IN NEBU: 2.5000 mg | INHALATION_SOLUTION | Freq: Four times a day (QID) | RESPIRATORY_TRACT | Status: DC | PRN

## 2019-03-06 MED ORDER — ASPIRIN EC 325 MG PO TBEC
325.0000 mg | DELAYED_RELEASE_TABLET | Freq: Two times a day (BID) | ORAL | Status: DC
  Administered 2019-03-07 – 2019-03-08 (×3): 325 mg via ORAL
  Filled 2019-03-06 (×3): qty 1

## 2019-03-06 MED ORDER — EPHEDRINE SULFATE-NACL 50-0.9 MG/10ML-% IV SOSY
PREFILLED_SYRINGE | INTRAVENOUS | Status: DC | PRN
  Administered 2019-03-06 (×2): 5 mg via INTRAVENOUS
  Administered 2019-03-06: 10 mg via INTRAVENOUS
  Administered 2019-03-06: 5 mg via INTRAVENOUS

## 2019-03-06 MED ORDER — POLYETHYLENE GLYCOL 3350 17 G PO PACK: 17.0000 g | PACK | Freq: Every day | ORAL | Status: DC | PRN

## 2019-03-06 MED ORDER — MIDAZOLAM HCL 5 MG/5ML IJ SOLN
INTRAMUSCULAR | Status: DC | PRN
  Administered 2019-03-06: 2 mg via INTRAVENOUS

## 2019-03-06 MED ORDER — SODIUM CHLORIDE 0.9 % IR SOLN
Status: DC | PRN
  Administered 2019-03-06: 1000 mL

## 2019-03-06 MED ORDER — PHENYLEPHRINE 40 MCG/ML (10ML) SYRINGE FOR IV PUSH (FOR BLOOD PRESSURE SUPPORT)
PREFILLED_SYRINGE | INTRAVENOUS | Status: DC | PRN
  Administered 2019-03-06 (×3): 80 ug via INTRAVENOUS

## 2019-03-06 MED ORDER — PHENYLEPHRINE 40 MCG/ML (10ML) SYRINGE FOR IV PUSH (FOR BLOOD PRESSURE SUPPORT)
PREFILLED_SYRINGE | INTRAVENOUS | Status: AC
  Filled 2019-03-06: qty 10

## 2019-03-06 MED ORDER — ONDANSETRON HCL 4 MG/2ML IJ SOLN: 4.0000 mg | Freq: Four times a day (QID) | INTRAMUSCULAR | Status: DC | PRN

## 2019-03-06 MED ORDER — HYDROMORPHONE HCL 1 MG/ML IJ SOLN
0.2500 mg | INTRAMUSCULAR | Status: DC | PRN
  Administered 2019-03-06: 0.5 mg via INTRAVENOUS

## 2019-03-06 MED ORDER — BUPIVACAINE-EPINEPHRINE (PF) 0.25% -1:200000 IJ SOLN
INTRAMUSCULAR | Status: AC
  Filled 2019-03-06: qty 30

## 2019-03-06 MED ORDER — MENTHOL 3 MG MT LOZG: 1.0000 | LOZENGE | OROMUCOSAL | Status: DC | PRN

## 2019-03-06 MED ORDER — CHLORHEXIDINE GLUCONATE 4 % EX LIQD: 60.0000 mL | Freq: Once | CUTANEOUS | Status: DC

## 2019-03-06 MED ORDER — BUPIVACAINE HCL (PF) 0.5 % IJ SOLN
INTRAMUSCULAR | Status: AC
  Filled 2019-03-06: qty 30

## 2019-03-06 MED ORDER — ONDANSETRON HCL 4 MG/2ML IJ SOLN
INTRAMUSCULAR | Status: DC | PRN
  Administered 2019-03-06: 4 mg via INTRAVENOUS

## 2019-03-06 MED ORDER — METHOCARBAMOL 500 MG PO TABS
500.0000 mg | ORAL_TABLET | Freq: Four times a day (QID) | ORAL | Status: DC | PRN
  Administered 2019-03-06 – 2019-03-08 (×5): 500 mg via ORAL
  Filled 2019-03-06 (×5): qty 1

## 2019-03-06 MED ORDER — ATENOLOL-CHLORTHALIDONE 50-25 MG PO TABS: 1.0000 | ORAL_TABLET | Freq: Every day | ORAL | Status: DC

## 2019-03-06 MED ORDER — 0.9 % SODIUM CHLORIDE (POUR BTL) OPTIME
TOPICAL | Status: DC | PRN
  Administered 2019-03-06: 12:00:00 1000 mL

## 2019-03-06 MED ORDER — METHOCARBAMOL 500 MG IVPB - SIMPLE MED
500.0000 mg | Freq: Four times a day (QID) | INTRAVENOUS | Status: DC | PRN
  Filled 2019-03-06: qty 50

## 2019-03-06 MED ORDER — STERILE WATER FOR IRRIGATION IR SOLN
Status: DC | PRN
  Administered 2019-03-06: 2000 mL

## 2019-03-06 MED ORDER — DOCUSATE SODIUM 100 MG PO CAPS
100.0000 mg | ORAL_CAPSULE | Freq: Two times a day (BID) | ORAL | Status: DC
  Administered 2019-03-06 – 2019-03-08 (×4): 100 mg via ORAL
  Filled 2019-03-06 (×4): qty 1

## 2019-03-06 MED ORDER — MAGNESIUM CITRATE PO SOLN: 1.0000 | Freq: Once | ORAL | Status: DC | PRN

## 2019-03-06 MED ORDER — MORPHINE SULFATE (PF) 2 MG/ML IV SOLN: 0.5000 mg | INTRAVENOUS | Status: DC | PRN

## 2019-03-06 MED ORDER — DEXAMETHASONE SODIUM PHOSPHATE 10 MG/ML IJ SOLN
10.0000 mg | Freq: Once | INTRAMUSCULAR | Status: AC
  Administered 2019-03-07: 10 mg via INTRAVENOUS
  Filled 2019-03-06: qty 1

## 2019-03-06 MED ORDER — LACTATED RINGERS IV SOLN
INTRAVENOUS | Status: DC
  Administered 2019-03-06 (×2): via INTRAVENOUS

## 2019-03-06 MED ORDER — FENTANYL CITRATE (PF) 100 MCG/2ML IJ SOLN
INTRAMUSCULAR | Status: DC | PRN
  Administered 2019-03-06: 50 ug via INTRAVENOUS

## 2019-03-06 MED ORDER — CEFAZOLIN SODIUM-DEXTROSE 2-4 GM/100ML-% IV SOLN
2.0000 g | Freq: Four times a day (QID) | INTRAVENOUS | Status: AC
  Administered 2019-03-06 (×2): 2 g via INTRAVENOUS
  Filled 2019-03-06 (×2): qty 100

## 2019-03-06 MED ORDER — MIDAZOLAM HCL 2 MG/2ML IJ SOLN
INTRAMUSCULAR | Status: AC
  Filled 2019-03-06: qty 2

## 2019-03-06 MED ORDER — CEFAZOLIN SODIUM-DEXTROSE 2-4 GM/100ML-% IV SOLN
2.0000 g | INTRAVENOUS | Status: AC
  Administered 2019-03-06: 11:00:00 2 g via INTRAVENOUS
  Filled 2019-03-06: qty 100

## 2019-03-06 MED ORDER — TRANEXAMIC ACID-NACL 1000-0.7 MG/100ML-% IV SOLN
1000.0000 mg | Freq: Once | INTRAVENOUS | Status: AC
  Administered 2019-03-06: 17:00:00 1000 mg via INTRAVENOUS
  Filled 2019-03-06: qty 100

## 2019-03-06 MED ORDER — PROPOFOL 500 MG/50ML IV EMUL
INTRAVENOUS | Status: DC | PRN
  Administered 2019-03-06: 40 ug/kg/min via INTRAVENOUS

## 2019-03-06 MED ORDER — AMLODIPINE BESYLATE 5 MG PO TABS
5.0000 mg | ORAL_TABLET | Freq: Every day | ORAL | Status: DC
  Administered 2019-03-07 – 2019-03-08 (×2): 5 mg via ORAL
  Filled 2019-03-06 (×2): qty 1

## 2019-03-06 MED ORDER — MEPERIDINE HCL 50 MG/ML IJ SOLN: 6.2500 mg | INTRAMUSCULAR | Status: DC | PRN

## 2019-03-06 MED ORDER — ONDANSETRON HCL 4 MG PO TABS: 4.0000 mg | ORAL_TABLET | Freq: Four times a day (QID) | ORAL | Status: DC | PRN

## 2019-03-06 MED ORDER — FUROSEMIDE 20 MG PO TABS
20.0000 mg | ORAL_TABLET | ORAL | Status: DC
  Administered 2019-03-07: 20 mg via ORAL
  Filled 2019-03-06 (×2): qty 1

## 2019-03-06 MED ORDER — CHLORTHALIDONE 25 MG PO TABS
25.0000 mg | ORAL_TABLET | Freq: Every day | ORAL | Status: DC
  Administered 2019-03-07 – 2019-03-08 (×2): 25 mg via ORAL
  Filled 2019-03-06 (×2): qty 1

## 2019-03-06 MED ORDER — TRAMADOL HCL 50 MG PO TABS
50.0000 mg | ORAL_TABLET | Freq: Four times a day (QID) | ORAL | Status: DC | PRN
  Administered 2019-03-06 – 2019-03-07 (×2): 100 mg via ORAL
  Filled 2019-03-06 (×2): qty 2

## 2019-03-06 MED ORDER — FENTANYL CITRATE (PF) 100 MCG/2ML IJ SOLN
INTRAMUSCULAR | Status: AC
  Filled 2019-03-06: qty 2

## 2019-03-06 MED ORDER — POVIDONE-IODINE 10 % EX SWAB
2.0000 "application " | Freq: Once | CUTANEOUS | Status: AC
  Administered 2019-03-06: 2 via TOPICAL

## 2019-03-06 MED ORDER — PROPOFOL 10 MG/ML IV BOLUS
INTRAVENOUS | Status: AC
  Filled 2019-03-06: qty 40

## 2019-03-06 MED ORDER — ONDANSETRON HCL 4 MG/2ML IJ SOLN: 4.0000 mg | Freq: Once | INTRAMUSCULAR | Status: DC | PRN

## 2019-03-06 MED ORDER — DEXAMETHASONE SODIUM PHOSPHATE 10 MG/ML IJ SOLN
8.0000 mg | Freq: Once | INTRAMUSCULAR | Status: AC
  Administered 2019-03-06: 11:00:00 8 mg via INTRAVENOUS

## 2019-03-06 MED ORDER — GABAPENTIN 400 MG PO CAPS
800.0000 mg | ORAL_CAPSULE | Freq: Four times a day (QID) | ORAL | Status: DC
  Administered 2019-03-06 – 2019-03-08 (×7): 800 mg via ORAL
  Filled 2019-03-06 (×7): qty 2

## 2019-03-06 MED ORDER — HYDROMORPHONE HCL 1 MG/ML IJ SOLN
0.5000 mg | INTRAMUSCULAR | Status: DC | PRN
  Administered 2019-03-07 (×5): 1 mg via INTRAVENOUS
  Filled 2019-03-06 (×5): qty 1

## 2019-03-06 MED ORDER — ACETAMINOPHEN 500 MG PO TABS
500.0000 mg | ORAL_TABLET | Freq: Four times a day (QID) | ORAL | Status: AC
  Administered 2019-03-06 – 2019-03-07 (×4): 500 mg via ORAL
  Filled 2019-03-06 (×4): qty 1

## 2019-03-06 MED ORDER — EPHEDRINE 5 MG/ML INJ
INTRAVENOUS | Status: AC
  Filled 2019-03-06: qty 10

## 2019-03-06 MED ORDER — INSULIN ASPART 100 UNIT/ML ~~LOC~~ SOLN
0.0000 [IU] | Freq: Three times a day (TID) | SUBCUTANEOUS | Status: DC
  Administered 2019-03-06: 17:00:00 5 [IU] via SUBCUTANEOUS
  Administered 2019-03-07 (×2): 3 [IU] via SUBCUTANEOUS
  Administered 2019-03-07: 5 [IU] via SUBCUTANEOUS
  Administered 2019-03-08: 08:00:00 2 [IU] via SUBCUTANEOUS

## 2019-03-06 MED ORDER — OXYCODONE HCL 5 MG PO TABS
5.0000 mg | ORAL_TABLET | ORAL | Status: DC | PRN
  Administered 2019-03-06 – 2019-03-08 (×7): 10 mg via ORAL
  Filled 2019-03-06 (×7): qty 2

## 2019-03-06 MED ORDER — BISACODYL 10 MG RE SUPP: 10.0000 mg | Freq: Every day | RECTAL | Status: DC | PRN

## 2019-03-06 MED ORDER — TRANEXAMIC ACID-NACL 1000-0.7 MG/100ML-% IV SOLN
1000.0000 mg | INTRAVENOUS | Status: AC
  Administered 2019-03-06: 11:00:00 1000 mg via INTRAVENOUS
  Filled 2019-03-06: qty 100

## 2019-03-06 MED ORDER — ACETAMINOPHEN 10 MG/ML IV SOLN
INTRAVENOUS | Status: AC
  Filled 2019-03-06: qty 100

## 2019-03-06 MED ORDER — METOCLOPRAMIDE HCL 5 MG PO TABS: 5.0000 mg | ORAL_TABLET | Freq: Three times a day (TID) | ORAL | Status: DC | PRN

## 2019-03-06 MED ORDER — POTASSIUM CHLORIDE CRYS ER 20 MEQ PO TBCR
20.0000 meq | EXTENDED_RELEASE_TABLET | Freq: Two times a day (BID) | ORAL | Status: DC
  Administered 2019-03-06 – 2019-03-07 (×3): 20 meq via ORAL
  Filled 2019-03-06 (×3): qty 1

## 2019-03-06 MED ORDER — BUPIVACAINE-EPINEPHRINE (PF) 0.25% -1:200000 IJ SOLN
INTRAMUSCULAR | Status: DC | PRN
  Administered 2019-03-06: 30 mL

## 2019-03-06 MED ORDER — SODIUM CHLORIDE 0.9 % IV SOLN
INTRAVENOUS | Status: DC
  Administered 2019-03-06 – 2019-03-07 (×2): via INTRAVENOUS

## 2019-03-06 MED ORDER — ATENOLOL 50 MG PO TABS
50.0000 mg | ORAL_TABLET | Freq: Every day | ORAL | Status: DC
  Administered 2019-03-07 – 2019-03-08 (×2): 50 mg via ORAL
  Filled 2019-03-06 (×2): qty 1

## 2019-03-06 MED ORDER — DOXAZOSIN MESYLATE 8 MG PO TABS
8.0000 mg | ORAL_TABLET | Freq: Every day | ORAL | Status: DC
  Administered 2019-03-07 – 2019-03-08 (×2): 8 mg via ORAL
  Filled 2019-03-06 (×3): qty 1

## 2019-03-06 MED ORDER — PHENOL 1.4 % MT LIQD: 1.0000 | OROMUCOSAL | Status: DC | PRN

## 2019-03-06 MED ORDER — ACETAMINOPHEN 10 MG/ML IV SOLN
1000.0000 mg | Freq: Four times a day (QID) | INTRAVENOUS | Status: DC
  Administered 2019-03-06: 11:00:00 1000 mg via INTRAVENOUS
  Filled 2019-03-06: qty 100

## 2019-03-06 SURGICAL SUPPLY — 47 items
BAG DECANTER FOR FLEXI CONT (MISCELLANEOUS) IMPLANT
BAG SPEC THK2 15X12 ZIP CLS (MISCELLANEOUS)
BAG ZIPLOCK 12X15 (MISCELLANEOUS) IMPLANT
BALL HIP CERAMIC (Hips) IMPLANT
BLADE SAG 18X100X1.27 (BLADE) ×3 IMPLANT
CLOSURE WOUND 1/2 X4 (GAUZE/BANDAGES/DRESSINGS) ×2
COVER PERINEAL POST (MISCELLANEOUS) ×3 IMPLANT
COVER SURGICAL LIGHT HANDLE (MISCELLANEOUS) ×3 IMPLANT
COVER WAND RF STERILE (DRAPES) IMPLANT
CUP ACETBLR 52 OD PINNACLE (Hips) ×2 IMPLANT
DECANTER SPIKE VIAL GLASS SM (MISCELLANEOUS) ×3 IMPLANT
DRAPE STERI IOBAN 125X83 (DRAPES) ×3 IMPLANT
DRAPE U-SHAPE 47X51 STRL (DRAPES) ×6 IMPLANT
DRSG ADAPTIC 3X8 NADH LF (GAUZE/BANDAGES/DRESSINGS) ×3 IMPLANT
DRSG MEPILEX BORDER 4X4 (GAUZE/BANDAGES/DRESSINGS) ×3 IMPLANT
DRSG MEPILEX BORDER 4X8 (GAUZE/BANDAGES/DRESSINGS) ×3 IMPLANT
DURAPREP 26ML APPLICATOR (WOUND CARE) ×3 IMPLANT
ELECT REM PT RETURN 15FT ADLT (MISCELLANEOUS) ×3 IMPLANT
EVACUATOR 1/8 PVC DRAIN (DRAIN) ×3 IMPLANT
FEM STEM 12/14 TAPER SZ 4 HIP (Orthopedic Implant) ×3 IMPLANT
FEMORAL STEM 12/14 TPR SZ4 HIP (Orthopedic Implant) IMPLANT
GLOVE BIO SURGEON STRL SZ 6 (GLOVE) ×4 IMPLANT
GLOVE BIO SURGEON STRL SZ7 (GLOVE) IMPLANT
GLOVE BIO SURGEON STRL SZ8 (GLOVE) ×3 IMPLANT
GLOVE BIOGEL PI IND STRL 6.5 (GLOVE) IMPLANT
GLOVE BIOGEL PI IND STRL 7.0 (GLOVE) IMPLANT
GLOVE BIOGEL PI IND STRL 8 (GLOVE) ×1 IMPLANT
GLOVE BIOGEL PI INDICATOR 6.5 (GLOVE) ×2
GLOVE BIOGEL PI INDICATOR 7.0 (GLOVE)
GLOVE BIOGEL PI INDICATOR 8 (GLOVE) ×2
GOWN STRL REUS W/TWL LRG LVL3 (GOWN DISPOSABLE) ×3 IMPLANT
GOWN STRL REUS W/TWL XL LVL3 (GOWN DISPOSABLE) IMPLANT
HIP BALL CERAMIC (Hips) ×3 IMPLANT
HOLDER FOLEY CATH W/STRAP (MISCELLANEOUS) ×3 IMPLANT
KIT TURNOVER KIT A (KITS) IMPLANT
LINER MARATHON NEUT +4X52X32 (Hips) ×2 IMPLANT
MANIFOLD NEPTUNE II (INSTRUMENTS) ×3 IMPLANT
PACK ANTERIOR HIP CUSTOM (KITS) ×3 IMPLANT
STRIP CLOSURE SKIN 1/2X4 (GAUZE/BANDAGES/DRESSINGS) ×3 IMPLANT
SUT ETHIBOND NAB CT1 #1 30IN (SUTURE) ×3 IMPLANT
SUT MNCRL AB 4-0 PS2 18 (SUTURE) ×3 IMPLANT
SUT STRATAFIX 0 PDS 27 VIOLET (SUTURE) ×3
SUT VIC AB 2-0 CT1 27 (SUTURE) ×6
SUT VIC AB 2-0 CT1 TAPERPNT 27 (SUTURE) ×2 IMPLANT
SUTURE STRATFX 0 PDS 27 VIOLET (SUTURE) ×1 IMPLANT
TRAY FOLEY MTR SLVR 16FR STAT (SET/KITS/TRAYS/PACK) ×3 IMPLANT
YANKAUER SUCT BULB TIP 10FT TU (MISCELLANEOUS) ×3 IMPLANT

## 2019-03-06 NOTE — Discharge Instructions (Signed)
°Dr. Frank Aluisio °Total Joint Specialist °[facility] °[address] °[place], [place] [address] °[phone] ° °ANTERIOR APPROACH TOTAL HIP REPLACEMENT POSTOPERATIVE DIRECTIONS ° ° °Hip Rehabilitation, Guidelines Following Surgery  °The results of a hip operation are greatly improved after range of motion and muscle strengthening exercises. Follow all safety measures which are given to protect your hip. If any of these exercises cause increased pain or swelling in your joint, decrease the amount until you are comfortable again. Then slowly increase the exercises. Call your caregiver if you have problems or questions.  ° °HOME CARE INSTRUCTIONS  °• Remove items at home which could result in a fall. This includes throw rugs or furniture in walking pathways.  °· ICE to the affected hip every three hours for 30 minutes at a time and then as needed for pain and swelling.  Continue to use ice on the hip for pain and swelling from surgery. You may notice swelling that will progress down to the foot and ankle.  This is normal after surgery.  Elevate the leg when you are not up walking on it.   °· Continue to use the breathing machine which will help keep your temperature down.  It is common for your temperature to cycle up and down following surgery, especially at night when you are not up moving around and exerting yourself.  The breathing machine keeps your lungs expanded and your temperature down. ° °DIET °You may resume your previous home diet once your are discharged from the hospital. ° °DRESSING / WOUND CARE / SHOWERING °You may change your dressing 3-5 days after surgery.  Then change the dressing every day with sterile gauze.  Please use good hand washing techniques before changing the dressing.  Do not use any lotions or creams on the incision until instructed by your surgeon. °You may start showering once you are discharged home but do not submerge the incision under water. Just pat the  incision dry and apply a dry gauze dressing on daily. °Change the surgical dressing daily and reapply a dry dressing each time. ° °ACTIVITY °Walk with your walker as instructed. °Use walker as long as suggested by your caregivers. °Avoid periods of inactivity such as sitting longer than an hour when not asleep. This helps prevent blood clots.  °You may resume a sexual relationship in one month or when given the OK by your doctor.  °You may return to work once you are cleared by your doctor.  °Do not drive a car for 6 weeks or until released by you surgeon.  °Do not drive while taking narcotics. ° °WEIGHT BEARING °Weight bearing as tolerated with assist device (walker, cane, etc) as directed, use it as long as suggested by your surgeon or therapist, typically at least 4-6 weeks. ° °POSTOPERATIVE CONSTIPATION PROTOCOL °Constipation - defined medically as fewer than three stools per week and severe constipation as less than one stool per week. ° °One of the most common issues patients have following surgery is constipation.  Even if you have a regular bowel pattern at home, your normal regimen is likely to be disrupted due to multiple reasons following surgery.  Combination of anesthesia, postoperative narcotics, change in appetite and fluid intake all can affect your bowels.  In order to avoid complications following surgery, here are some recommendations in order to help you during your recovery period. ° °Colace (docusate) - Pick up an over-the-counter form of Colace or another stool softener and take twice a day   as long as you are requiring postoperative pain medications.  Take with a full glass of water daily.  If you experience loose stools or diarrhea, hold the colace until you stool forms back up.  If your symptoms do not get better within 1 week or if they get worse, check with your doctor. ° °Dulcolax (bisacodyl) - Pick up over-the-counter and take as directed by the product packaging as needed to assist with  the movement of your bowels.  Take with a full glass of water.  Use this product as needed if not relieved by Colace only.  ° °MiraLax (polyethylene glycol) - Pick up over-the-counter to have on hand.  MiraLax is a solution that will increase the amount of water in your bowels to assist with bowel movements.  Take as directed and can mix with a glass of water, juice, soda, coffee, or tea.  Take if you go more than two days without a movement. °Do not use MiraLax more than once per day. Call your doctor if you are still constipated or irregular after using this medication for 7 days in a row. ° °If you continue to have problems with postoperative constipation, please contact the office for further assistance and recommendations.  If you experience "the worst abdominal pain ever" or develop nausea or vomiting, please contact the office immediatly for further recommendations for treatment. ° °ITCHING ° If you experience itching with your medications, try taking only a single pain pill, or even half a pain pill at a time.  You can also use Benadryl over the counter for itching or also to help with sleep.  ° °TED HOSE STOCKINGS °Wear the elastic stockings on both legs for three weeks following surgery during the day but you may remove then at night for sleeping. ° °MEDICATIONS °See your medication summary on the “After Visit Summary” that the nursing staff will review with you prior to discharge.  You may have some home medications which will be placed on hold until you complete the course of blood thinner medication.  It is important for you to complete the blood thinner medication as prescribed by your surgeon.  Continue your approved medications as instructed at time of discharge. ° °PRECAUTIONS °If you experience chest pain or shortness of breath - call 911 immediately for transfer to the hospital emergency department.  °If you develop a fever greater that 101 F, purulent drainage from wound, increased redness or  drainage from wound, foul odor from the wound/dressing, or calf pain - CONTACT YOUR SURGEON.   °                                                °FOLLOW-UP APPOINTMENTS °Make sure you keep all of your appointments after your operation with your surgeon and caregivers. You should call the office at the above phone number and make an appointment for approximately two weeks after the date of your surgery or on the date instructed by your surgeon outlined in the "After Visit Summary". ° °RANGE OF MOTION AND STRENGTHENING EXERCISES  °These exercises are designed to help you keep full movement of your hip joint. Follow your caregiver's or physical therapist's instructions. Perform all exercises about fifteen times, three times per day or as directed. Exercise both hips, even if you have had only one joint replacement. These exercises can be done on   a training (exercise) mat, on the floor, on a table or on a bed. Use whatever works the best and is most comfortable for you. Use music or television while you are exercising so that the exercises are a pleasant break in your day. This will make your life better with the exercises acting as a break in routine you can look forward to.  °• Lying on your back, slowly slide your foot toward your buttocks, raising your knee up off the floor. Then slowly slide your foot back down until your leg is straight again.  °• Lying on your back spread your legs as far apart as you can without causing discomfort.  °• Lying on your side, raise your upper leg and foot straight up from the floor as far as is comfortable. Slowly lower the leg and repeat.  °• Lying on your back, tighten up the muscle in the front of your thigh (quadriceps muscles). You can do this by keeping your leg straight and trying to raise your heel off the floor. This helps strengthen the largest muscle supporting your knee.  °• Lying on your back, tighten up the muscles of your buttocks both with the legs straight and with  the knee bent at a comfortable angle while keeping your heel on the floor.  ° °IF YOU ARE TRANSFERRED TO A SKILLED REHAB FACILITY °If the patient is transferred to a skilled rehab facility following release from the hospital, a list of the current medications will be sent to the facility for the patient to continue.  When discharged from the skilled rehab facility, please have the facility set up the patient's Home Health Physical Therapy prior to being released. Also, the skilled facility will be responsible for providing the patient with their medications at time of release from the facility to include their pain medication, the muscle relaxants, and their blood thinner medication. If the patient is still at the rehab facility at time of the two week follow up appointment, the skilled rehab facility will also need to assist the patient in arranging follow up appointment in our office and any transportation needs. ° °MAKE SURE YOU:  °• Understand these instructions.  °• Get help right away if you are not doing well or get worse.  ° ° °Pick up stool softner and laxative for home use following surgery while on pain medications. °Do not submerge incision under water. °Please use good hand washing techniques while changing dressing each day. °May shower starting three days after surgery. °Please use a clean towel to pat the incision dry following showers. °Continue to use ice for pain and swelling after surgery. °Do not use any lotions or creams on the incision until instructed by your surgeon. ° °

## 2019-03-06 NOTE — Op Note (Signed)
OPERATIVE REPORT- TOTAL HIP ARTHROPLASTY   PREOPERATIVE DIAGNOSIS: Osteoarthritis of the Right hip.   POSTOPERATIVE DIAGNOSIS: Osteoarthritis of the Right  hip.   PROCEDURE: Right total hip arthroplasty, anterior approach.   SURGEON: Ollen GrossFrank Leslye Puccini, MD   ASSISTANT: Dennie BibleAshley Stinson, PA-C  ANESTHESIA:  Spinal  ESTIMATED BLOOD LOSS:-150 mL    DRAINS: Hemovac x1.   COMPLICATIONS: None   CONDITION: PACU - hemodynamically stable.   BRIEF CLINICAL NOTE: Patrick Aguilar is a 68 y.o. male who has advanced end-  stage arthritis of their Right  hip with progressively worsening pain and  dysfunction.The patient has failed nonoperative management and presents for  total hip arthroplasty.   PROCEDURE IN DETAIL: After successful administration of spinal  anesthetic, the traction boots [facility] were placed on both  feet and the [facility] was [facility] the [facility] bed, boots placed into the leg  holders. The Right hip was then isolated from the perineum with plastic  drapes and prepped and draped in the usual sterile fashion. ASIS and  greater trochanter were marked and a oblique incision was made, starting  at about 1 cm lateral and 2 cm distal to the ASIS and coursing towards  the anterior cortex of the femur. The skin was cut with a 10 blade  through subcutaneous tissue to the level of the fascia overlying the  tensor fascia lata muscle. The fascia was then incised in line with the  incision at the junction of the anterior third and posterior 2/3rd. The  muscle was teased off the fascia and then the interval between the TFL  and the rectus was developed. The Hohmann retractor was then placed at  the top of the femoral neck over the capsule. The vessels overlying the  capsule were cauterized and the fat on top of the capsule was removed.  A Hohmann retractor was then placed anterior underneath the rectus  femoris to give exposure to the entire anterior capsule. A T-shaped   capsulotomy was performed. The edges were tagged and the femoral head  was identified.       Osteophytes are removed off the superior acetabulum.  The femoral neck was then cut in situ with an oscillating saw. Traction  was then applied to the left lower extremity utilizing the [facility]  traction. The femoral head was then removed. Retractors were placed  around the acetabulum and then circumferential removal of the labrum was  performed. Osteophytes were also removed. Reaming starts at 47 mm to  medialize and  Increased in 2 mm increments to 51 mm. We reamed in  approximately 40 degrees of abduction, 20 degrees anteversion. A 52 mm  pinnacle acetabular shell was then impacted in anatomic position under  fluoroscopic guidance with excellent purchase. We did not need to place  any additional dome screws. A 32 mm neutral + 4 marathon liner was then  placed into the acetabular shell.       The femoral lift was then placed along the lateral aspect of the femur  just distal to the vastus ridge. The leg was  externally rotated and capsule  was stripped off the inferior aspect of the femoral neck down to the  level of the lesser trochanter, this was done with electrocautery. The femur was lifted after this was performed. The  leg was then placed in an extended and adducted position essentially delivering the femur. We also removed the capsule superiorly and the piriformis from the piriformis  fossa to gain excellent exposure of the  proximal femur. Rongeur was used to remove some cancellous bone to get  into the lateral portion of the proximal femur for placement of the  initial starter reamer. The starter broaches was placed  the starter broach  and was shown to go down the center of the canal. Broaching  with the Actis system was then performed starting at size 0  coursing  Up to size 4. A size 4 had excellent torsional and rotational  and axial stability. The trial high offset neck was then placed   with a 32 + 5 trial head. The hip was then reduced. We confirmed that  the stem was in the canal both on AP and lateral x-rays. It also has excellent sizing. The hip was reduced with outstanding stability through full extension and full external rotation.. AP pelvis was taken and the leg lengths were measured and found to be equal. Hip was then dislocated again and the femoral head and neck removed. The  femoral broach was removed. Size 4 Actis stem with a HIGH offset  neck was then impacted into the femur following native anteversion. Has  excellent purchase in the canal. Excellent torsional and rotational and  axial stability. It is confirmed to be in the canal on AP and lateral  fluoroscopic views. The 32 + 5 ceramic head was placed and the hip  reduced with outstanding stability. Again AP pelvis was taken and it  confirmed that the leg lengths were equal. The wound was then copiously  irrigated with saline solution and the capsule reattached and repaired  with Ethibond suture. 30 ml of .25% Bupivicaine was  injected into the capsule and into the edge of the tensor fascia lata as well as subcutaneous tissue. The fascia overlying the tensor fascia lata was then closed with a running #1 V-Loc. Subcu was closed with interrupted 2-0 Vicryl and subcuticular running 4-0 Monocryl. Incision was cleaned  and dried. Steri-Strips and a bulky sterile dressing applied. Hemovac  drain was hooked to suction and then the patient was awakened and transported to  recovery in stable condition.        Please note that a surgical assistant was a medical necessity for this procedure to perform it in a safe and expeditious manner. Assistant was necessary to provide appropriate retraction of vital neurovascular structures and to prevent femoral fracture and allow for anatomic placement of the prosthesis.  Gaynelle Arabian, M.D.

## 2019-03-06 NOTE — Interval H&P Note (Signed)
History and Physical Interval Note:  03/06/2019 10:48 AM  Patrick Aguilar  has presented today for surgery, with the diagnosis of right hip osteoarthritis.  The various methods of treatment have been discussed with the patient and family. After consideration of risks, benefits and other options for treatment, the patient has consented to  Procedure(s) with comments: [place] (Right) - 174min as a surgical intervention.  The patient's history has been reviewed, patient examined, no change in status, stable for surgery.  I have reviewed the patient's chart and labs.  Questions were answered to the patient's satisfaction.     Pilar Plate Kinslee Dalpe

## 2019-03-06 NOTE — Anesthesia Procedure Notes (Signed)
Spinal  Patient location during procedure: OR Start time: 03/06/2019 10:50 AM End time: 03/06/2019 10:55 AM Staffing Anesthesiologist: Lillia Abed, MD Preanesthetic Checklist Completed: patient identified, surgical consent, pre-op evaluation, timeout performed, IV checked, risks and benefits discussed and monitors and equipment checked Spinal Block Patient position: sitting Prep: ChloraPrep Patient monitoring: heart rate, cardiac monitor, continuous pulse ox and blood pressure Approach: right paramedian Location: L3-4 Injection technique: single-shot Needle Needle type: Pencan  Needle gauge: 24 G Needle length: 9 cm Needle insertion depth: 6 cm

## 2019-03-06 NOTE — Transfer of Care (Signed)
Immediate Anesthesia Transfer of Care Note  Patient: Patrick Aguilar  Procedure(s) Performed: TOTAL HIP ARTHROPLASTY ANTERIOR APPROACH (Right Hip)  Patient Location: PACU  Anesthesia Type:MAC and Spinal  Level of Consciousness: awake, alert , oriented and patient cooperative  Airway & Oxygen Therapy: Patient Spontanous Breathing and Patient connected to face mask oxygen  Post-op Assessment: Report given to RN, Post -op Vital signs reviewed and stable and Patient moving all extremities  Post vital signs: Reviewed and stable  Last Vitals:  Vitals Value Taken Time  BP 96/63 03/06/19 1222  Temp    Pulse 77 03/06/19 1224  Resp 15 03/06/19 1224  SpO2 100 % 03/06/19 1224  Vitals shown include unvalidated device data.  Last Pain:  Vitals:   03/06/19 0938  TempSrc:   PainSc: 7          Complications: No apparent anesthesia complications

## 2019-03-06 NOTE — Anesthesia Postprocedure Evaluation (Signed)
Anesthesia Post Note  Patient: MIKAELE STECHER  Procedure(s) Performed: TOTAL HIP ARTHROPLASTY ANTERIOR APPROACH (Right Hip)     Patient location during evaluation: PACU Anesthesia Type: Spinal Level of consciousness: oriented and awake and alert Pain management: pain level controlled Vital Signs Assessment: post-procedure vital signs reviewed and stable Respiratory status: spontaneous breathing, respiratory function stable and patient connected to nasal cannula oxygen Cardiovascular status: blood pressure returned to baseline and stable Postop Assessment: no headache, no backache and no apparent nausea or vomiting Anesthetic complications: no    Last Vitals:  Vitals:   03/06/19 1611 03/06/19 1701  BP: 134/89 129/79  Pulse: 88 (!) 106  Resp: 17 17  Temp: 36.7 C 36.7 C  SpO2: 95% 99%    Last Pain:  Vitals:   03/06/19 1611  TempSrc: Oral  PainSc: 8     LLE Motor Response: Purposeful movement (03/06/19 1700) LLE Sensation: Full sensation (03/06/19 1700) RLE Motor Response: Purposeful movement (03/06/19 1700) RLE Sensation: Full sensation (03/06/19 1700) L Sensory Level: S1-Sole of foot, small toes (03/06/19 1700) R Sensory Level: S1-Sole of foot, small toes (03/06/19 1700)  Alayah Knouff DAVID

## 2019-03-06 NOTE — Evaluation (Signed)
Physical Therapy Evaluation Patient Details Name: Patrick FredericksonCharles W Codner MRN: [id number] DOB: [birth year] Today's Date: 03/06/2019   History of Present Illness  68 yo male s/p R DA-THA on 03/06/19. PMH includes cellulitis, fournier's gangrene with perineal I&D and orchiectomy, DM, HTN, COPD, AKI, anxiety, DDD with lumbar surgery, paresthesias of hands.  Clinical Impression  Pt presents with R hip pain, decreased R hip strength post-operatively, difficulty performing mobility tasks, and decreased activity tolerance due to pain. Pt to benefit from acute PT to address deficits. Pt ambulated hallway distance with RW with min guard assist, verbal cuing for form and safety provided throughout. Pt lives alone, but states he has a friend that has offered to stay with him if needed. Pt educated on ankle pumps (20/hour) to perform this afternoon/evening to increase circulation, to pt's tolerance and limited by pain. PT to progress mobility as tolerated, and will continue to follow acutely.        Follow Up Recommendations Follow surgeon's recommendation for DC plan and follow-up therapies;Supervision for mobility/OOB(HEP)    Equipment Recommendations  None recommended by PT    Recommendations for Other Services       Precautions / Restrictions Precautions Precautions: Fall Restrictions Weight Bearing Restrictions: No Other Position/Activity Restrictions: WBAT      Mobility  Bed Mobility Overal bed mobility: Needs Assistance Bed Mobility: Supine to Sit     Supine to sit: Min assist;HOB elevated     General bed mobility comments: Min assist for RLE management, scooting to EOB with increased time. Verbal cuing for sequencing.  Transfers Overall transfer level: Needs assistance Equipment used: Rolling walker (2 wheeled) Transfers: Sit to/from Stand Sit to Stand: From elevated surface;Min assist         General transfer comment: Min assist for initiation of power up, steadying. VC for hand  placement when rising.  Ambulation/Gait Ambulation/Gait assistance: Min guard Gait Distance (Feet): 50 Feet Assistive device: Rolling walker (2 wheeled) Gait Pattern/deviations: Step-to pattern;Decreased step length - left;Decreased step length - right;Antalgic;Trunk flexed Gait velocity: decr   General Gait Details: Min guard for safety. Verbal cuing for placement in RW, sequencing, upright posture  Stairs            Wheelchair Mobility    Modified Rankin (Stroke Patients Only)       Balance Overall balance assessment: Mild deficits observed, not formally tested                                           Pertinent Vitals/Pain Pain Assessment: 0-10 Pain Score: 10-Worst pain ever Pain Location: R hip Pain Descriptors / Indicators: Sore;Discomfort;Grimacing Pain Intervention(s): Monitored during session;Premedicated before session;Repositioned;Limited activity within patient's tolerance;Ice applied    Home Living Family/patient expects to be discharged to:: Private residence Living Arrangements: Alone Available Help at Discharge: Family;Available PRN/intermittently Type of Home: House Home Access: Stairs to enter Entrance Stairs-Rails: None Entrance Stairs-Number of Steps: 1+1 Home Layout: Two level;Able to live on main level with bedroom/bathroom Home Equipment: Gilmer MorCane - single point;Walker - 2 wheels;Bedside commode      Prior Function Level of Independence: Independent with assistive device(s)         Comments: Pt reports using cane PTA for ambulation, states daughter can assist him as needed.     Hand Dominance   Dominant Hand: Right    Extremity/Trunk Assessment   Upper Extremity Assessment Upper Extremity  Assessment: Overall WFL for tasks assessed    Lower Extremity Assessment Lower Extremity Assessment: Overall WFL for tasks assessed;RLE deficits/detail RLE Deficits / Details: suspected post-surgical weakness; able to perform  ankle pumps, quad set, heel slide, SLR with lift assist RLE Sensation: WNL RLE Coordination: WNL    Cervical / Trunk Assessment Cervical / Trunk Assessment: Normal  Communication   Communication: No difficulties  Cognition Arousal/Alertness: Awake/alert Behavior During Therapy: WFL for tasks assessed/performed Overall Cognitive Status: Within Functional Limits for tasks assessed                                 General Comments: Pt perturbed about lines/leads, upset about hip pain.      General Comments      Exercises     Assessment/Plan    PT Assessment Patient needs continued PT services  PT Problem List Decreased strength;Decreased mobility;Decreased range of motion;Decreased balance;Decreased knowledge of use of DME;Decreased activity tolerance;Pain       PT Treatment Interventions DME instruction;Therapeutic activities;Gait training;Therapeutic exercise;Patient/family education;Balance training;Stair training;Functional mobility training    PT Goals (Current goals can be found in the Care Plan section)  Acute Rehab PT Goals Patient Stated Goal: go home PT Goal Formulation: With patient Time For Goal Achievement: 03/13/19 Potential to Achieve Goals: Good    Frequency 7X/week   Barriers to discharge        Co-evaluation               AM-PAC PT "6 Clicks" Mobility  Outcome Measure Help needed turning from your back to your side while in a flat bed without using bedrails?: A Little Help needed moving from lying on your back to sitting on the side of a flat bed without using bedrails?: A Little Help needed moving to and from a bed to a chair (including a wheelchair)?: A Little Help needed standing up from a chair using your arms (e.g., wheelchair or bedside chair)?: A Little Help needed to walk in hospital room?: A Little Help needed climbing 3-5 steps with a railing? : A Lot 6 Click Score: 17    End of Session Equipment Utilized During  Treatment: Gait belt Activity Tolerance: Patient limited by pain Patient left: in chair;with call bell/phone within reach;with chair alarm set;with SCD's reapplied Nurse Communication: Mobility status PT Visit Diagnosis: Other abnormalities of gait and mobility (R26.89);Difficulty in walking, not elsewhere classified (R26.2)    Time: 2706-2376 PT Time Calculation (min) (ACUTE ONLY): 32 min   Charges:   PT Evaluation $PT Eval Low Complexity: 1 Low PT Treatments $Gait Training: 8-22 mins       Julien Girt, PT Acute Rehabilitation Services Pager [phone]  Office [phone]  Roxine Caddy D Elonda Husky 03/06/2019, 5:48 PM

## 2019-03-07 ENCOUNTER — Encounter (HOSPITAL_COMMUNITY): Payer: Self-pay | Admitting: Orthopedic Surgery

## 2019-03-07 LAB — BASIC METABOLIC PANEL
Anion gap: 9 (ref 5–15)
BUN: 21 mg/dL (ref 8–23)
CO2: 27 mmol/L (ref 22–32)
Calcium: 8.5 mg/dL — ABNORMAL LOW (ref 8.9–10.3)
Chloride: 98 mmol/L (ref 98–111)
Creatinine, Ser: 0.84 mg/dL (ref 0.61–1.24)
GFR calc Af Amer: >60 mL/min (ref >60)
GFR calc non Af Amer: >60 mL/min (ref >60)
Glucose, Bld: 175 mg/dL — ABNORMAL HIGH (ref 70–99)
Potassium: 3.2 mmol/L — ABNORMAL LOW (ref 3.5–5.1)
Sodium: 134 mmol/L — ABNORMAL LOW (ref 135–145)

## 2019-03-07 LAB — GLUCOSE, CAPILLARY
Glucose-Capillary: 152 mg/dL — ABNORMAL HIGH (ref 70–99)
Glucose-Capillary: 163 mg/dL — ABNORMAL HIGH (ref 70–99)
Glucose-Capillary: 227 mg/dL — ABNORMAL HIGH (ref 70–99)
Glucose-Capillary: 159 mg/dL — ABNORMAL HIGH (ref 70–99)

## 2019-03-07 LAB — CBC
HCT: 33.6 % — ABNORMAL LOW (ref 39.0–52.0)
Hemoglobin: 11.1 g/dL — ABNORMAL LOW (ref 13.0–17.0)
MCH: 29.8 pg (ref 26.0–34.0)
MCHC: 33 g/dL (ref 30.0–36.0)
MCV: 90.1 fL (ref 80.0–100.0)
Platelets: 221 10*3/uL (ref 150–400)
RBC: 3.73 MIL/uL — ABNORMAL LOW (ref 4.22–5.81)
RDW: 13 % (ref 11.5–15.5)
WBC: 13.5 10*3/uL — ABNORMAL HIGH (ref 4.0–10.5)
nRBC: 0 % (ref 0.0–0.2)

## 2019-03-07 NOTE — Progress Notes (Signed)
Nurse tech transferred patient from chair to bed and hemovac came out.

## 2019-03-07 NOTE — Progress Notes (Signed)
Subjective: 1 Day Post-Op Procedure(s) (LRB): TOTAL HIP ARTHROPLASTY ANTERIOR APPROACH (Right) Patient reports pain as moderate.   Patient seen in rounds with Dr. Wynelle Link. Patient is well, and has had no acute complaints or problems other than pain in the right hip. States he had a lot of difficulty with pain last night. Denies chest pain or SOB. Foley catheter removed this AM.  We will continue therapy today.   Objective: Vital signs in last 24 hours: Temp:  [97.3 F (36.3 C)-98.4 F (36.9 C)] 97.8 F (36.6 C) (06/25 0609) Pulse Rate:  [62-106] 72 (06/25 0609) Resp:  [9-19] 18 (06/25 0609) BP: (96-148)/(63-89) 148/76 (06/25 0609) SpO2:  [94 %-100 %] 98 % (06/25 0609) Weight:  [78 kg] 78 kg (06/24 0939)  Intake/Output from previous day:  Intake/Output Summary (Last 24 hours) at 03/07/2019 0751 Last data filed at 03/07/2019 9211 Gross per 24 hour  Intake 3953.42 ml  Output 4165 ml  Net -211.58 ml    Labs: Recent Labs    03/07/19 0257  HGB 11.1*   Recent Labs    03/07/19 0257  WBC 13.5*  RBC 3.73*  HCT 33.6*  PLT 221   Recent Labs    03/07/19 0257  NA 134*  K 3.2*  CL 98  CO2 27  BUN 21  CREATININE 0.84  GLUCOSE 175*  CALCIUM 8.5*   Exam: General - Patient is Alert and Oriented Extremity - Neurologically intact Neurovascular intact Sensation intact distally Dorsiflexion/Plantar flexion intact Dressing - dressing C/D/I Motor Function - intact, moving foot and toes well on exam.   Past Medical History:  Diagnosis Date  . AKI (acute kidney injury) ([place]) 12/2017  . Anxiety   . Arthritis   . Complication of anesthesia    low respirations, low BP  . COPD (chronic obstructive pulmonary disease) ([place])   . DDD (degenerative disc disease), lumbar   . Diabetes mellitus without complication ([place])    type 2  . Diastolic dysfunction 94/17/4081   Moderate noted on ECHO  . Fournier's gangrene in male   . GERD (gastroesophageal reflux disease)   . History  of ARDS   . History of necrotizing fasciitis    Severe  . Hypertension   . Lumbar spondylosis   . Numbness and tingling of both upper extremities   . Pulmonary hypertension ([place]) 12/14/2017   Mild, noted on ECHO  . Respiratory failure, acute ([place]) 12/2017  . Septic shock ([place]) 12/2017  . Shortness of breath dyspnea   . Testicular pain, left   . Wears glasses     Assessment/Plan: 1 Day Post-Op Procedure(s) (LRB): TOTAL HIP ARTHROPLASTY ANTERIOR APPROACH (Right) Principal Problem:   OA (osteoarthritis) of hip Active Problems:   Osteoarthritis of right hip  Estimated body mass index is 26.15 kg/m as calculated from the following:   Height as of this encounter: 5\' 8"  (1.727 m).   Weight as of this encounter: 78 kg. Advance diet Up with therapy  DVT Prophylaxis - Aspirin Weight bearing as tolerated. D/C O2 and pulse ox and try on room air. Hemovac came out earlier in the night. Will continue therapy.  Potassium dropped from 3.8 preoperatively to 3.2. Patient takes 20 mEq KCl BID chronically. Will recheck with repeat BMP tomorrow.   Pain will be difficulty to control due to chronic use of percocet. Will continue to monitor.  Plan is to go Home after hospital stay. Will require additional night due to medical comorbidities and for pain management, plan  for discharge tomorrow.   Arther AbbottKristie Bethany Hirt, PA-C Orthopedic Surgery 03/07/2019, 7:51 AM

## 2019-03-07 NOTE — Progress Notes (Signed)
Physical Therapy Treatment Patient Details Name: Patrick Aguilar MRN: [id number] DOB: 09-14-1950 Today's Date: 03/07/2019    History of Present Illness 68 yo male s/p R DA-THA on 03/06/19. PMH includes cellulitis, fournier's gangrene with perineal I&D and orchiectomy, DM, HTN, COPD, AKI, anxiety, DDD with lumbar surgery, paresthesias of hands.    PT Comments    Pt ambulated in hallway and reports continued significant pain however able to tolerate improved distance this afternoon.  Pt also performed LE exercises.  Pt plans to d/c home tomorrow.  Follow Up Recommendations  Follow surgeon's recommendation for DC plan and follow-up therapies;Supervision for mobility/OOB     Equipment Recommendations  None recommended by PT    Recommendations for Other Services       Precautions / Restrictions Precautions Precautions: Fall Restrictions Other Position/Activity Restrictions: WBAT    Mobility  Bed Mobility Overal bed mobility: Needs Assistance Bed Mobility: Supine to Sit;Sit to Supine     Supine to sit: Min assist;HOB elevated Sit to supine: Min assist   General bed mobility comments: Min assist for RLE management, scooting to EOB with increased time. Verbal cuing for sequencing.  Transfers Overall transfer level: Needs assistance Equipment used: Rolling walker (2 wheeled) Transfers: Sit to/from Stand Sit to Stand: From elevated surface;Min guard         General transfer comment: verbal cues for hand placement and LE positioning for pain control  Ambulation/Gait Ambulation/Gait assistance: Min guard Gait Distance (Feet): 100 Feet Assistive device: Rolling walker (2 wheeled) Gait Pattern/deviations: Step-to pattern;Antalgic;Trunk flexed;Decreased stance time - right Gait velocity: decr   General Gait Details: verbal cues for sequence, RW positioning, posture, step length   Stairs             Wheelchair Mobility    Modified Rankin (Stroke Patients Only)        Balance                                            Cognition Arousal/Alertness: Awake/alert Behavior During Therapy: WFL for tasks assessed/performed Overall Cognitive Status: Within Functional Limits for tasks assessed                                        Exercises Total Joint Exercises Ankle Circles/Pumps: AROM;10 reps;Both Quad Sets: AROM;Both;10 reps Short Arc Quad: AROM;Right;10 reps Heel Slides: AAROM;Right;10 reps Hip ABduction/ADduction: AAROM;10 reps;Right Long Arc Quad: AROM;Right;Seated;10 reps    General Comments        Pertinent Vitals/Pain Pain Assessment: 0-10 Pain Score: 9  Pain Location: R hip Pain Descriptors / Indicators: Sore;Discomfort;Shooting Pain Intervention(s): Premedicated before session;Repositioned;Ice applied;Monitored during session    Home Living                      Prior Function            PT Goals (current goals can now be found in the care plan section) Progress towards PT goals: Progressing toward goals    Frequency    7X/week      PT Plan Current plan remains appropriate    Co-evaluation              AM-PAC PT "6 Clicks" Mobility   Outcome Measure  Help needed turning from your back to  your side while in a flat bed without using bedrails?: A Little Help needed moving from lying on your back to sitting on the side of a flat bed without using bedrails?: A Little Help needed moving to and from a bed to a chair (including a wheelchair)?: A Little Help needed standing up from a chair using your arms (e.g., wheelchair or bedside chair)?: A Little Help needed to walk in hospital room?: A Little Help needed climbing 3-5 steps with a railing? : A Lot 6 Click Score: 17    End of Session Equipment Utilized During Treatment: Gait belt Activity Tolerance: Patient limited by pain Patient left: in bed;with bed alarm set;with call bell/phone within reach   PT Visit  Diagnosis: Other abnormalities of gait and mobility (R26.89);Difficulty in walking, not elsewhere classified (R26.2)     Time: [id number] PT Time Calculation (min) (ACUTE ONLY): 34 min  Charges:  $Gait Training: 8-22 mins $Therapeutic Exercise: 8-22 mins                     Zenovia JarredKati Lalaine Overstreet, PT, DPT Acute Rehabilitation Services Office: [phone] Pager: [phone]  Sarajane JewsLEMYRE,KATHrine E 03/07/2019, 4:18 PM

## 2019-03-07 NOTE — Progress Notes (Signed)
Physical Therapy Treatment Patient Details Name: Patrick Aguilar MRN: [id number] DOB: 1951/02/24 Today's Date: 03/07/2019    History of Present Illness 68 yo male s/p R DA-THA on 03/06/19. PMH includes cellulitis, fournier's gangrene with perineal I&D and orchiectomy, DM, HTN, COPD, AKI, anxiety, DDD with lumbar surgery, paresthesias of hands.    PT Comments    Pt ambulated in hallway and reports moderate pain today.  Pt performed ambulation as tolerated and assisted back to bed (reports recliner very uncomfortable for groin region due to fournier's gangrene surgery).     Follow Up Recommendations  Follow surgeon's recommendation for DC plan and follow-up therapies;Supervision for mobility/OOB     Equipment Recommendations  None recommended by PT    Recommendations for Other Services       Precautions / Restrictions Precautions Precautions: Fall Restrictions Other Position/Activity Restrictions: WBAT    Mobility  Bed Mobility Overal bed mobility: Needs Assistance Bed Mobility: Supine to Sit     Supine to sit: Min assist;HOB elevated     General bed mobility comments: Min assist for RLE management, scooting to EOB with increased time. Verbal cuing for sequencing.  Transfers Overall transfer level: Needs assistance Equipment used: Rolling walker (2 wheeled) Transfers: Sit to/from Stand Sit to Stand: From elevated surface;Min assist         General transfer comment: assist to rise and steady, verbal cues for hand placement  Ambulation/Gait Ambulation/Gait assistance: Min guard Gait Distance (Feet): 50 Feet Assistive device: Rolling walker (2 wheeled) Gait Pattern/deviations: Step-to pattern;Antalgic;Trunk flexed;Decreased stance time - right Gait velocity: decr   General Gait Details: verbal cues for sequence, RW positioning, posture, step length   Stairs             Wheelchair Mobility    Modified Rankin (Stroke Patients Only)       Balance                                             Cognition Arousal/Alertness: Awake/alert Behavior During Therapy: WFL for tasks assessed/performed Overall Cognitive Status: Within Functional Limits for tasks assessed                                        Exercises      General Comments        Pertinent Vitals/Pain Pain Assessment: 0-10 Pain Score: 7  Pain Location: R hip Pain Descriptors / Indicators: Sore;Discomfort;Shooting Pain Intervention(s): Monitored during session;Limited activity within patient's tolerance;Repositioned;Ice applied    Home Living                      Prior Function            PT Goals (current goals can now be found in the care plan section) Progress towards PT goals: Progressing toward goals    Frequency    7X/week      PT Plan Current plan remains appropriate    Co-evaluation              AM-PAC PT "6 Clicks" Mobility   Outcome Measure  Help needed turning from your back to your side while in a flat bed without using bedrails?: A Little Help needed moving from lying on your back to sitting on the side of  a flat bed without using bedrails?: A Little Help needed moving to and from a bed to a chair (including a wheelchair)?: A Little Help needed standing up from a chair using your arms (e.g., wheelchair or bedside chair)?: A Little Help needed to walk in hospital room?: A Little Help needed climbing 3-5 steps with a railing? : A Lot 6 Click Score: 17    End of Session Equipment Utilized During Treatment: Gait belt Activity Tolerance: Patient limited by pain Patient left: in bed;with bed alarm set;with call bell/phone within reach   PT Visit Diagnosis: Other abnormalities of gait and mobility (R26.89);Difficulty in walking, not elsewhere classified (R26.2)     Time: [id number] PT Time Calculation (min) (ACUTE ONLY): 22 min  Charges:  $Gait Training: 8-22 mins                    Zenovia JarredKati  Ariza Evans, PT, DPT Acute Rehabilitation Services Office: [phone] Pager: [phone]   Sarajane JewsLEMYRE,KATHrine E 03/07/2019, 12:43 PM

## 2019-03-08 LAB — BASIC METABOLIC PANEL
Anion gap: 14 (ref 5–15)
BUN: 22 mg/dL (ref 8–23)
CO2: 26 mmol/L (ref 22–32)
Calcium: 8.8 mg/dL — ABNORMAL LOW (ref 8.9–10.3)
Chloride: 97 mmol/L — ABNORMAL LOW (ref 98–111)
Creatinine, Ser: 0.8 mg/dL (ref 0.61–1.24)
GFR calc Af Amer: >60 mL/min (ref >60)
GFR calc non Af Amer: >60 mL/min (ref >60)
Glucose, Bld: 169 mg/dL — ABNORMAL HIGH (ref 70–99)
Potassium: 3.2 mmol/L — ABNORMAL LOW (ref 3.5–5.1)
Sodium: 137 mmol/L (ref 135–145)

## 2019-03-08 LAB — GLUCOSE, CAPILLARY: Glucose-Capillary: 126 mg/dL — ABNORMAL HIGH (ref 70–99)

## 2019-03-08 LAB — CBC
HCT: 34.5 % — ABNORMAL LOW (ref 39.0–52.0)
Hemoglobin: 11.6 g/dL — ABNORMAL LOW (ref 13.0–17.0)
MCH: 29.8 pg (ref 26.0–34.0)
MCHC: 33.6 g/dL (ref 30.0–36.0)
MCV: 88.7 fL (ref 80.0–100.0)
Platelets: 220 10*3/uL (ref 150–400)
RBC: 3.89 MIL/uL — ABNORMAL LOW (ref 4.22–5.81)
RDW: 13.2 % (ref 11.5–15.5)
WBC: 13.4 10*3/uL — ABNORMAL HIGH (ref 4.0–10.5)
nRBC: 0 % (ref 0.0–0.2)

## 2019-03-08 MED ORDER — POTASSIUM CHLORIDE CRYS ER 20 MEQ PO TBCR
40.0000 meq | EXTENDED_RELEASE_TABLET | Freq: Two times a day (BID) | ORAL | Status: DC
  Administered 2019-03-08: 40 meq via ORAL
  Filled 2019-03-08 (×2): qty 2

## 2019-03-08 MED ORDER — ASPIRIN 325 MG PO TBEC: 325.0000 mg | DELAYED_RELEASE_TABLET | Freq: Two times a day (BID) | ORAL | 0 refills | Status: AC

## 2019-03-08 MED ORDER — OXYCODONE HCL 5 MG PO TABS: 5.0000 mg | ORAL_TABLET | Freq: Four times a day (QID) | ORAL | 0 refills | Status: DC | PRN

## 2019-03-08 MED ORDER — TRAMADOL HCL 50 MG PO TABS: 50.0000 mg | ORAL_TABLET | Freq: Four times a day (QID) | ORAL | 0 refills | Status: DC | PRN

## 2019-03-08 MED ORDER — METHOCARBAMOL 500 MG PO TABS: 500.0000 mg | ORAL_TABLET | Freq: Four times a day (QID) | ORAL | 0 refills | Status: DC | PRN

## 2019-03-08 NOTE — TOC Transition Note (Signed)
Transition of Care [facility]) - CM/SW Discharge Note   Patient Details  Name: Patrick Aguilar MRN: [id number] Date of Birth: [birth year]  [facility] CM/SW Contact:  Lia Hopping, [place] Phone Number: 03/08/2019, 10:38 AM   Clinical Narrative:    [place] arranged.   Patient has DME    Final next level of care: Home w Home Health Services Barriers to Discharge: No Barriers Identified   Patient Goals and CMS Choice   CMS Medicare.gov Compare Post Acute Care list provided to:: Patient    Discharge Placement  Home                   Patient and family notified of of transfer: 03/08/19  Discharge Plan and Services                DME Arranged: (Patient has DME)         HH Arranged: PT [place] Agency: [facility]formerly [employer]) Date [place]: 03/08/19 Time [place]: 1037 Representative spoke with at [place]: [place] ([place]) Interventions     Readmission Risk Interventions No flowsheet data found.

## 2019-03-08 NOTE — Progress Notes (Signed)
   Subjective: 2 Days Post-Op Procedure(s) (LRB): TOTAL HIP ARTHROPLASTY ANTERIOR APPROACH (Right) Patient reports pain as mild.   Patient seen in rounds with Dr. Wynelle Link. Patient is well, and has had no acute complaints or problems. Pain has improved since yesterday, patient states he is ready to go home. Denies chest pain or SOB. Voiding without difficulty and positive flatus. No issues overnight.  Plan is to go Home after hospital stay.  Objective: Vital signs in last 24 hours: Temp:  [97.9 F (36.6 C)-98.8 F (37.1 C)] 97.9 F (36.6 C) (06/26 0509) Pulse Rate:  [70-77] 70 (06/26 0509) Resp:  [16-19] 19 (06/26 0509) BP: (125-149)/(81-88) 145/83 (06/26 0509) SpO2:  [96 %-97 %] 96 % (06/26 0509)  Intake/Output from previous day:  Intake/Output Summary (Last 24 hours) at 03/08/2019 0734 Last data filed at 03/08/2019 0509 Gross per 24 hour  Intake 1501.91 ml  Output 2850 ml  Net -1348.09 ml   Labs: Recent Labs    03/07/19 0257 03/08/19 0311  HGB 11.1* 11.6*   Recent Labs    03/07/19 0257 03/08/19 0311  WBC 13.5* 13.4*  RBC 3.73* 3.89*  HCT 33.6* 34.5*  PLT 221 220   Recent Labs    03/07/19 0257 03/08/19 0311  NA 134* 137  K 3.2* 3.2*  CL 98 97*  CO2 27 26  BUN 21 22  CREATININE 0.84 0.80  GLUCOSE 175* 169*  CALCIUM 8.5* 8.8*   Exam: General - Patient is Alert and Oriented Extremity - Neurologically intact Neurovascular intact Sensation intact distally Dorsiflexion/Plantar flexion intact Dressing/Incision - clean, dry, no drainage Motor Function - intact, moving foot and toes well on exam.   Past Medical History:  Diagnosis Date  . AKI (acute kidney injury) ([place]) 12/2017  . Anxiety   . Arthritis   . Complication of anesthesia    low respirations, low BP  . COPD (chronic obstructive pulmonary disease) ([place])   . DDD (degenerative disc disease), lumbar   . Diabetes mellitus without complication ([place])    type 2  . Diastolic dysfunction 29/93/7169    Moderate noted on ECHO  . Fournier's gangrene in male   . GERD (gastroesophageal reflux disease)   . History of ARDS   . History of necrotizing fasciitis    Severe  . Hypertension   . Lumbar spondylosis   . Numbness and tingling of both upper extremities   . Pulmonary hypertension ([place]) 12/14/2017   Mild, noted on ECHO  . Respiratory failure, acute ([place]) 12/2017  . Septic shock ([place]) 12/2017  . Shortness of breath dyspnea   . Testicular pain, left   . Wears glasses     Assessment/Plan: 2 Days Post-Op Procedure(s) (LRB): TOTAL HIP ARTHROPLASTY ANTERIOR APPROACH (Right) Principal Problem:   OA (osteoarthritis) of hip Active Problems:   Osteoarthritis of right hip  Estimated body mass index is 26.15 kg/m as calculated from the following:   Height as of this encounter: 5\' 8"  (1.727 m).   Weight as of this encounter: 78 kg. Up with therapy D/C IV fluids  DVT Prophylaxis - Aspirin Weight-bearing as tolerated  Potassium still low at 3.2 this AM despite two doses of 20 mEq KCl yesterday, increased dose to 40 mEq BID for today prior to discharge  Plan for discharge to home after one session of therapy this AM. HHPT order placed. Follow-up in the office in 2 weeks.   Theresa Duty, PA-C Orthopedic Surgery 03/08/2019, 7:34 AM

## 2019-03-08 NOTE — Progress Notes (Signed)
Physical Therapy Treatment Patient Details Name: Patrick Aguilar MRN: [id number] DOB: [birth year] Today's Date: 03/08/2019    History of Present Illness 68 yo male s/p R DA-THA on 03/06/19. PMH includes cellulitis, fournier's gangrene with perineal I&D and orchiectomy, DM, HTN, COPD, AKI, anxiety, DDD with lumbar surgery, paresthesias of hands.    PT Comments    Pt ambulated in hallway and practiced one step.  Pt also performed LE exercises in sitting and supine.  Encouraged pt to wait for HHPT to start with standing exercises for safety (as he reports L LE is also "bad") and he is agreeable.  Pt feels ready for d/c home today.   Follow Up Recommendations  Follow surgeon's recommendation for DC plan and follow-up therapies;Supervision for mobility/OOB     Equipment Recommendations  None recommended by PT    Recommendations for Other Services       Precautions / Restrictions Precautions Precautions: Fall Restrictions Other Position/Activity Restrictions: WBAT    Mobility  Bed Mobility Overal bed mobility: Needs Assistance Bed Mobility: Supine to Sit;Sit to Supine     Supine to sit: Min guard Sit to supine: Min guard   General bed mobility comments: verbal cues for self assist of R LE  Transfers Overall transfer level: Needs assistance Equipment used: Rolling walker (2 wheeled) Transfers: Sit to/from Stand Sit to Stand: Min guard         General transfer comment: verbal cues for hand placement and LE positioning for pain control  Ambulation/Gait Ambulation/Gait assistance: Min guard Gait Distance (Feet): 120 Feet Assistive device: Rolling walker (2 wheeled) Gait Pattern/deviations: Step-to pattern;Antalgic;Trunk flexed;Decreased stance time - right Gait velocity: decr   General Gait Details: verbal cues for sequence, RW positioning, posture, step length   Stairs Stairs: Yes Stairs assistance: Min guard Stair Management: Step to pattern;Forwards;With  walker Number of Stairs: 1 General stair comments: verbal cues for sequence, RW positioning, safety; pt performed twice and reports understanding   Wheelchair Mobility    Modified Rankin (Stroke Patients Only)       Balance                                            Cognition Arousal/Alertness: Awake/alert Behavior During Therapy: WFL for tasks assessed/performed Overall Cognitive Status: Within Functional Limits for tasks assessed                                        Exercises Total Joint Exercises Ankle Circles/Pumps: AROM;10 reps;Both Quad Sets: AROM;Both;10 reps Short Arc Quad: AROM;Right;10 reps Heel Slides: AAROM;Right;10 reps Hip ABduction/ADduction: AAROM;10 reps;Right Long Arc Quad: AROM;Right;Seated;10 reps    General Comments        Pertinent Vitals/Pain Pain Assessment: 0-10 Pain Score: 7  Pain Location: R hip Pain Descriptors / Indicators: Sore;Discomfort Pain Intervention(s): Limited activity within patient's tolerance;Monitored during session;Premedicated before session;Repositioned;Ice applied    Home Living                      Prior Function            PT Goals (current goals can now be found in the care plan section) Progress towards PT goals: Progressing toward goals    Frequency    7X/week      PT  Plan Current plan remains appropriate    Co-evaluation              AM-PAC PT "6 Clicks" Mobility   Outcome Measure  Help needed turning from your back to your side while in a flat bed without using bedrails?: A Little Help needed moving from lying on your back to sitting on the side of a flat bed without using bedrails?: A Little Help needed moving to and from a bed to a chair (including a wheelchair)?: A Little Help needed standing up from a chair using your arms (e.g., wheelchair or bedside chair)?: A Little Help needed to walk in hospital room?: A Little Help needed climbing  3-5 steps with a railing? : A Little 6 Click Score: 18    End of Session Equipment Utilized During Treatment: Gait belt Activity Tolerance: Patient tolerated treatment well Patient left: in bed;with call bell/phone within reach Nurse Communication: Mobility status PT Visit Diagnosis: Other abnormalities of gait and mobility (R26.89);Difficulty in walking, not elsewhere classified (R26.2)     Time: [id number] PT Time Calculation (min) (ACUTE ONLY): 24 min  Charges:  $Gait Training: 8-22 mins $Therapeutic Exercise: 8-22 mins                     Zenovia JarredKati Eugine Bubb, PT, DPT Acute Rehabilitation Services Office: [phone] Pager: [phone]   Sarajane JewsLEMYRE,KATHrine E 03/08/2019, 3:34 PM

## 2019-03-08 NOTE — Plan of Care (Signed)
Plan of care reviewed and discussed with the patient. 

## 2019-03-11 NOTE — Discharge Summary (Signed)
Physician Discharge Summary   Patient ID: Patrick Aguilar MRN: [id number] DOB/AGE: 1951/01/11 68 y.o.  Admit date: 03/06/2019 Discharge date: 03/08/2019  Primary Diagnosis: Osteoarthritis, right hip   Admission Diagnoses:  Past Medical History:  Diagnosis Date  . AKI (acute kidney injury) ([place]) 12/2017  . Anxiety   . Arthritis   . Complication of anesthesia    low respirations, low BP  . COPD (chronic obstructive pulmonary disease) ([place])   . DDD (degenerative disc disease), lumbar   . Diabetes mellitus without complication ([place])    type 2  . Diastolic dysfunction 53/64/6803   Moderate noted on ECHO  . Fournier's gangrene in male   . GERD (gastroesophageal reflux disease)   . History of ARDS   . History of necrotizing fasciitis    Severe  . Hypertension   . Lumbar spondylosis   . Numbness and tingling of both upper extremities   . Pulmonary hypertension ([place]) 12/14/2017   Mild, noted on ECHO  . Respiratory failure, acute ([place]) 12/2017  . Septic shock ([place]) 12/2017  . Shortness of breath dyspnea   . Testicular pain, left   . Wears glasses    Discharge Diagnoses:   Principal Problem:   OA (osteoarthritis) of hip Active Problems:   Osteoarthritis of right hip  Estimated body mass index is 26.15 kg/m as calculated from the following:   Height as of this encounter: _0  (1.727 m).   Weight as of this encounter: 78 kg.  Procedure:  Procedure(s) (LRB): TOTAL HIP ARTHROPLASTY ANTERIOR APPROACH (Right)   Consults: None  HPI: Patrick Aguilar is a 68 y.o. male who has advanced end-stage arthritis of their Right  hip with progressively worsening pain and dysfunction.The patient has failed nonoperative management and presents for total hip arthroplasty.   Laboratory Data: Admission on 03/06/2019, Discharged on 03/08/2019  Component Date Value Ref Range Status  . Glucose-Capillary 03/06/2019 143* 70 - 99 mg/dL Final  . Glucose-Capillary 03/06/2019 162* 70 - 99 mg/dL  Final  . Glucose-Capillary 03/06/2019 206* 70 - 99 mg/dL Final  . WBC 03/07/2019 13.5* 4.0 - 10.5 K/uL Final  . RBC 03/07/2019 3.73* 4.22 - 5.81 MIL/uL Final  . Hemoglobin 03/07/2019 11.1* 13.0 - 17.0 g/dL Final  . HCT 03/07/2019 33.6* 39.0 - 52.0 % Final  . MCV 03/07/2019 90.1  80.0 - 100.0 fL Final  . MCH 03/07/2019 29.8  26.0 - 34.0 pg Final  . MCHC 03/07/2019 33.0  30.0 - 36.0 g/dL Final  . RDW 03/07/2019 13.0  11.5 - 15.5 % Final  . Platelets 03/07/2019 221  150 - 400 K/uL Final  . nRBC 03/07/2019 0.0  0.0 - 0.2 % Final   Performed at [facility], [place] [address], [place], [place] [address]  . Sodium 03/07/2019 134* 135 - 145 mmol/L Final  . Potassium 03/07/2019 3.2* 3.5 - 5.1 mmol/L Final  . Chloride 03/07/2019 98  98 - 111 mmol/L Final  . CO2 03/07/2019 27  22 - 32 mmol/L Final  . Glucose, Bld 03/07/2019 175* 70 - 99 mg/dL Final  . BUN 03/07/2019 21  8 - 23 mg/dL Final  . Creatinine, Ser 03/07/2019 0.84  0.61 - 1.24 mg/dL Final  . Calcium 03/07/2019 8.5* 8.9 - 10.3 mg/dL Final  . GFR calc non Af Amer 03/07/2019 >60  >60 mL/min Final  . GFR calc Af Amer 03/07/2019 >60  >60 mL/min Final  . Anion gap 03/07/2019 9  5 - 15 Final  Performed at [facility], [place] [address] [place], [place] [address]  . Glucose-Capillary 03/06/2019 190* 70 - 99 mg/dL Final  . Glucose-Capillary 03/07/2019 152* 70 - 99 mg/dL Final  . Glucose-Capillary 03/07/2019 163* 70 - 99 mg/dL Final  . Glucose-Capillary 03/07/2019 227* 70 - 99 mg/dL Final  . WBC 03/08/2019 13.4* 4.0 - 10.5 K/uL Final  . RBC 03/08/2019 3.89* 4.22 - 5.81 MIL/uL Final  . Hemoglobin 03/08/2019 11.6* 13.0 - 17.0 g/dL Final  . HCT 03/08/2019 34.5* 39.0 - 52.0 % Final  . MCV 03/08/2019 88.7  80.0 - 100.0 fL Final  . MCH 03/08/2019 29.8  26.0 - 34.0 pg Final  . MCHC 03/08/2019 33.6  30.0 - 36.0 g/dL Final  . RDW 03/08/2019 13.2  11.5 - 15.5 % Final  . Platelets 03/08/2019 220  150 - 400 K/uL Final   . nRBC 03/08/2019 0.0  0.0 - 0.2 % Final   Performed at [facility], [place] [address] [place], [place] [address]  . Sodium 03/08/2019 137  135 - 145 mmol/L Final  . Potassium 03/08/2019 3.2* 3.5 - 5.1 mmol/L Final  . Chloride 03/08/2019 97* 98 - 111 mmol/L Final  . CO2 03/08/2019 26  22 - 32 mmol/L Final  . Glucose, Bld 03/08/2019 169* 70 - 99 mg/dL Final  . BUN 03/08/2019 22  8 - 23 mg/dL Final  . Creatinine, Ser 03/08/2019 0.80  0.61 - 1.24 mg/dL Final  . Calcium 03/08/2019 8.8* 8.9 - 10.3 mg/dL Final  . GFR calc non Af Amer 03/08/2019 >60  >60 mL/min Final  . GFR calc Af Amer 03/08/2019 >60  >60 mL/min Final  . Anion gap 03/08/2019 14  5 - 15 Final   Performed at [facility], [place] [address], [place], [place] [address]  . Glucose-Capillary 03/07/2019 159* 70 - 99 mg/dL Final  . Comment 1 03/07/2019 Notify RN   Final  . Glucose-Capillary 03/08/2019 126* 70 - 99 mg/dL Final  Hospital Outpatient Visit on 03/02/2019  Component Date Value Ref Range Status  . SARS Coronavirus 2 03/02/2019 NEGATIVE  NEGATIVE Final   Comment: (NOTE) SARS-CoV-2 target nucleic acids are NOT DETECTED. The SARS-CoV-2 RNA is generally detectable in upper and lower respiratory specimens during the acute phase of infection. Negative results do not preclude SARS-CoV-2 infection, do not rule out co-infections with other pathogens, and should not be used as the sole basis for treatment or other patient management decisions. Negative results must be combined with clinical observations, patient history, and epidemiological information. The expected result is Negative. Fact Sheet for Patients: TrashEliminator.se Fact Sheet for Healthcare Providers: WhoisBlogging.ch This test is not yet approved or cleared by the [place] FDA and  has been authorized for detection and/or diagnosis of SARS-CoV-2 by FDA under an Emergency Use  Authorization (EUA). This EUA will remain  in effect (meaning this test can be used) for the duration of the COVID-19 declaration under Section 56                          4(b)(1) of the Act, 21 U.S.C. section 360bbb-3(b)(1), unless the authorization is terminated or revoked sooner. Performed at [facility], [place] [address] [place], [place] [address]   Hospital Outpatient Visit on 03/01/2019  Component Date Value Ref Range Status  . Glucose-Capillary 03/01/2019 133* 70 - 99 mg/dL Final  . aPTT 03/01/2019 29  24 - 36 seconds Final  Performed at [facility], [place] [address] [place], [place] [address]  . WBC 03/01/2019 9.5  4.0 - 10.5 K/uL Final  . RBC 03/01/2019 4.49  4.22 - 5.81 MIL/uL Final  . Hemoglobin 03/01/2019 13.8  13.0 - 17.0 g/dL Final  . HCT 03/01/2019 41.1  39.0 - 52.0 % Final  . MCV 03/01/2019 91.5  80.0 - 100.0 fL Final  . MCH 03/01/2019 30.7  26.0 - 34.0 pg Final  . MCHC 03/01/2019 33.6  30.0 - 36.0 g/dL Final  . RDW 03/01/2019 13.4  11.5 - 15.5 % Final  . Platelets 03/01/2019 275  150 - 400 K/uL Final  . nRBC 03/01/2019 0.0  0.0 - 0.2 % Final   Performed at [facility], [place] [address] [place], [place] [address]  . Sodium 03/01/2019 140  135 - 145 mmol/L Final  . Potassium 03/01/2019 3.8  3.5 - 5.1 mmol/L Final  . Chloride 03/01/2019 100  98 - 111 mmol/L Final  . CO2 03/01/2019 27  22 - 32 mmol/L Final  . Glucose, Bld 03/01/2019 131* 70 - 99 mg/dL Final  . BUN 03/01/2019 26* 8 - 23 mg/dL Final  . Creatinine, Ser 03/01/2019 1.15  0.61 - 1.24 mg/dL Final  . Calcium 03/01/2019 9.3  8.9 - 10.3 mg/dL Final  . Total Protein 03/01/2019 7.1  6.5 - 8.1 g/dL Final  . Albumin 03/01/2019 4.2  3.5 - 5.0 g/dL Final  . AST 03/01/2019 21  15 - 41 U/L Final  . ALT 03/01/2019 28  0 - 44 U/L Final  . Alkaline Phosphatase 03/01/2019 82  38 - 126 U/L Final  . Total Bilirubin 03/01/2019 0.3  0.3 - 1.2 mg/dL Final  . GFR calc non Af Amer  03/01/2019 >60  >60 mL/min Final  . GFR calc Af Amer 03/01/2019 >60  >60 mL/min Final  . Anion gap 03/01/2019 13  5 - 15 Final   Performed at [facility], [place] [address], [place], [place] [address]  . Prothrombin Time 03/01/2019 12.0  11.4 - 15.2 seconds Final  . INR 03/01/2019 0.9  0.8 - 1.2 Final   Comment: (NOTE) INR goal varies based on device and disease states. Performed at [facility], [place] [address], [place], [place] [address]   . ABO/RH(D) 03/01/2019 B POS   Final  . Antibody Screen 03/01/2019 NEG   Final  . Sample Expiration 03/01/2019 03/09/2019,2359   Final  . Extend sample reason 03/01/2019    Final                   Value:NO TRANSFUSIONS OR PREGNANCY IN THE PAST 3 MONTHS Performed at [facility], [place] [address] [place], [place] [address]   . Hgb A1c MFr Bld 03/01/2019 6.2* 4.8 - 5.6 % Final   Comment: (NOTE) Pre diabetes:          5.7%-6.4% Diabetes:              >6.4% Glycemic control for   <7.0% adults with diabetes   . Mean Plasma Glucose 03/01/2019 131.24  mg/dL Final   Performed at [place] [address] [place], [place] [address]  . MRSA, PCR 03/01/2019 NEGATIVE  NEGATIVE Final  . Staphylococcus aureus 03/01/2019 NEGATIVE  NEGATIVE Final   Comment: (NOTE) The Xpert SA Assay (FDA approved for NASAL specimens in patients 65 years of age and older), is one component of a comprehensive surveillance program.  It is not intended to diagnose infection nor to guide or monitor treatment. Performed at [facility], [place] [address] [place], [place] [address]   . ABO/RH(D) 03/01/2019    Final                   Value:B POS Performed at [facility], [place] [address], [place], [place] [address]      X-Rays:Dg Pelvis Portable  Result Date: 03/06/2019 CLINICAL DATA:  Right hip replacement EXAM: PORTABLE PELVIS 1-2 VIEWS COMPARISON:  03/06/2019, 07/27/2018  FINDINGS: Status post right hip replacement with intact hardware and normal alignment. Surgical drain over the right hip. Gas in the soft tissues consistent with recent surgery. IMPRESSION: Status post right hip replacement with expected postsurgical changes. Electronically Signed   By: Donavan Foil M.D.   On: 03/06/2019 15:14   Dg C-arm 1-60 Min-no Report  Result Date: 03/06/2019 Fluoroscopy was utilized by the requesting physician.  No radiographic interpretation.   Dg Hip Operative Unilat W Or W/o Pelvis Right  Result Date: 03/06/2019 CLINICAL DATA:  Fluoroscopy for right hip replacement. EXAM: OPERATIVE RIGHT HIP (WITH PELVIS IF PERFORMED) 1 VIEW TECHNIQUE: Fluoroscopic spot image(s) were submitted for interpretation post-operatively. COMPARISON:  07/27/2018 FINDINGS: Fluoroscopic imaging of the right hip arthroplasty. Right hip arthroplasty appears located on these images. Pelvic bony ring is grossly intact. No significant change in the left hip joint. IMPRESSION: Interval placement of right hip arthroplasty. Electronically Signed   By: Markus Daft M.D.   On: 03/06/2019 14:16    EKG: Orders placed or performed during the hospital encounter of 03/01/19  . EKG 12 lead  . EKG 12 lead     Hospital Course: Patrick Aguilar is a 68 y.o. who was admitted to [facility]. They were brought to the operating room on 03/06/2019 and underwent Procedure(s): [place].  Patient tolerated the procedure well and was later transferred to the recovery room and then to the orthopaedic floor for postoperative care. They were given PO and IV analgesics for pain control following their surgery. They were given 24 hours of postoperative antibiotics of  Anti-infectives (From admission, onward)   Start     Dose/Rate Route Frequency Ordered Stop   03/06/19 1700  ceFAZolin (ANCEF) IVPB 2g/100 mL premix     2 g 200 mL/hr over 30 Minutes Intravenous Every 6 hours 03/06/19 1439  03/06/19 2222   03/06/19 0915  ceFAZolin (ANCEF) IVPB 2g/100 mL premix     2 g 200 mL/hr over 30 Minutes Intravenous On call to O.R. 03/06/19 2951 03/06/19 1116     and started on DVT prophylaxis in the form of Aspirin.   PT and OT were ordered for total joint protocol. Discharge planning consulted to help with postop disposition and equipment needs. Patient had a difficult night on the evening of surgery in regards to pain. They started to get up OOB with therapy on POD #0. Hemovac drain was pulled without difficulty on day one. Pain was improved on day one. Potassium dropped to 3.2 from 3.8 preoperatively despite chronic 20 mEq KCl BID given on day one. Continued to work with therapy into POD #2. Potassium dose was increased to 40 mEq KCl for two doses. Pt was seen during rounds on day two and was ready to go home pending progress with therapy. Dressing was changed and the incision was clean, dry, and intact with no drainage. Pt worked with therapy for one  additional session and was meeting their goals. He was discharged to home later that day in stable condition.  Diet: Diabetic diet Activity: WBAT Follow-up: in 2 weeks Disposition: Home with HHPT Discharged Condition: stable   Discharge Instructions    Call MD / Call 911   Complete by: As directed    If you experience chest pain or shortness of breath, CALL 911 and be transported to the hospital emergency room.  If you develope a fever above 101 F, pus (white drainage) or increased drainage or redness at the wound, or calf pain, call your surgeon's office.   Change dressing   Complete by: As directed    Change the dressing daily with sterile 4 x 4 inch gauze dressing and paper tape.   Constipation Prevention   Complete by: As directed    Drink plenty of fluids.  Prune juice may be helpful.  You may use a stool softener, such as Colace (over the counter) 100 mg twice a day.  Use MiraLax (over the counter) for constipation as needed.   Diet  - low sodium heart healthy   Complete by: As directed    Discharge instructions   Complete by: As directed    Dr. Gaynelle Arabian Total Joint Specialist [facility] [address] [place], [place] [address] [phone]  ANTERIOR APPROACH TOTAL HIP REPLACEMENT POSTOPERATIVE DIRECTIONS   Hip Rehabilitation, Guidelines Following Surgery  The results of a hip operation are greatly improved after range of motion and muscle strengthening exercises. Follow all safety measures which are given to protect your hip. If any of these exercises cause increased pain or swelling in your joint, decrease the amount until you are comfortable again. Then slowly increase the exercises. Call your caregiver if you have problems or questions.   HOME CARE INSTRUCTIONS  Remove items at home which could result in a fall. This includes throw rugs or furniture in walking pathways.  ICE to the affected hip every three hours for 30 minutes at a time and then as needed for pain and swelling.  Continue to use ice on the hip for pain and swelling from surgery. You may notice swelling that will progress down to the foot and ankle.  This is normal after surgery.  Elevate the leg when you are not up walking on it.   Continue to use the breathing machine which will help keep your temperature down.  It is common for your temperature to cycle up and down following surgery, especially at night when you are not up moving around and exerting yourself.  The breathing machine keeps your lungs expanded and your temperature down.  DIET You may resume your previous home diet once your are discharged from the hospital.  DRESSING / WOUND CARE / SHOWERING You may change your dressing 3-5 days after surgery.  Then change the dressing every day with sterile gauze.  Please use good hand washing techniques before changing the dressing.  Do not use any lotions or creams on the incision until instructed by your surgeon. You may start  showering once you are discharged home but do not submerge the incision under water. Just pat the incision dry and apply a dry gauze dressing on daily. Change the surgical dressing daily and reapply a dry dressing each time.  ACTIVITY Walk with your walker as instructed. Use walker as long as suggested by your caregivers. Avoid periods of inactivity such as sitting longer than an hour when not asleep. This helps prevent  blood clots.  You may resume a sexual relationship in one month or when given the OK by your doctor.  You may return to work once you are cleared by your doctor.  Do not drive a car for 6 weeks or until released by you surgeon.  Do not drive while taking narcotics.  WEIGHT BEARING Weight bearing as tolerated with assist device (walker, cane, etc) as directed, use it as long as suggested by your surgeon or therapist, typically at least 4-6 weeks.  POSTOPERATIVE CONSTIPATION PROTOCOL Constipation - defined medically as fewer than three stools per week and severe constipation as less than one stool per week.  One of the most common issues patients have following surgery is constipation.  Even if you have a regular bowel pattern at home, your normal regimen is likely to be disrupted due to multiple reasons following surgery.  Combination of anesthesia, postoperative narcotics, change in appetite and fluid intake all can affect your bowels.  In order to avoid complications following surgery, here are some recommendations in order to help you during your recovery period.  Colace (docusate) - Pick up an over-the-counter form of Colace or another stool softener and take twice a day as long as you are requiring postoperative pain medications.  Take with a full glass of water daily.  If you experience loose stools or diarrhea, hold the colace until you stool forms back up.  If your symptoms do not get better within 1 week or if they get worse, check with your doctor.  Dulcolax (bisacodyl)  - Pick up over-the-counter and take as directed by the product packaging as needed to assist with the movement of your bowels.  Take with a full glass of water.  Use this product as needed if not relieved by Colace only.   MiraLax (polyethylene glycol) - Pick up over-the-counter to have on hand.  MiraLax is a solution that will increase the amount of water in your bowels to assist with bowel movements.  Take as directed and can mix with a glass of water, juice, soda, coffee, or tea.  Take if you go more than two days without a movement. Do not use MiraLax more than once per day. Call your doctor if you are still constipated or irregular after using this medication for 7 days in a row.  If you continue to have problems with postoperative constipation, please contact the office for further assistance and recommendations.  If you experience "the worst abdominal pain ever" or develop nausea or vomiting, please contact the office immediatly for further recommendations for treatment.  ITCHING  If you experience itching with your medications, try taking only a single pain pill, or even half a pain pill at a time.  You can also use Benadryl over the counter for itching or also to help with sleep.   TED HOSE STOCKINGS Wear the elastic stockings on both legs for three weeks following surgery during the day but you may remove then at night for sleeping.  MEDICATIONS See your medication summary on the "After Visit Summary" that the nursing staff will review with you prior to discharge.  You may have some home medications which will be placed on hold until you complete the course of blood thinner medication.  It is important for you to complete the blood thinner medication as prescribed by your surgeon.  Continue your approved medications as instructed at time of discharge.  PRECAUTIONS If you experience chest pain or shortness of breath - call 911  immediately for transfer to the hospital emergency department.   If you develop a fever greater that 101 F, purulent drainage from wound, increased redness or drainage from wound, foul odor from the wound/dressing, or calf pain - CONTACT YOUR SURGEON.                                                   FOLLOW-UP APPOINTMENTS Make sure you keep all of your appointments after your operation with your surgeon and caregivers. You should call the office at the above phone number and make an appointment for approximately two weeks after the date of your surgery or on the date instructed by your surgeon outlined in the "After Visit Summary".  RANGE OF MOTION AND STRENGTHENING EXERCISES  These exercises are designed to help you keep full movement of your hip joint. Follow your caregiver's or physical therapist's instructions. Perform all exercises about fifteen times, three times per day or as directed. Exercise both hips, even if you have had only one joint replacement. These exercises can be done on a training (exercise) mat, on the floor, on a table or on a bed. Use whatever works the best and is most comfortable for you. Use music or television while you are exercising so that the exercises are a pleasant break in your day. This will make your life better with the exercises acting as a break in routine you can look forward to.  Lying on your back, slowly slide your foot toward your buttocks, raising your knee up off the floor. Then slowly slide your foot back down until your leg is straight again.  Lying on your back spread your legs as far apart as you can without causing discomfort.  Lying on your side, raise your upper leg and foot straight up from the floor as far as is comfortable. Slowly lower the leg and repeat.  Lying on your back, tighten up the muscle in the front of your thigh (quadriceps muscles). You can do this by keeping your leg straight and trying to raise your heel off the floor. This helps strengthen the largest muscle supporting your knee.  Lying on  your back, tighten up the muscles of your buttocks both with the legs straight and with the knee bent at a comfortable angle while keeping your heel on the floor.   IF YOU ARE TRANSFERRED TO A SKILLED REHAB FACILITY If the patient is transferred to a skilled rehab facility following release from the hospital, a list of the current medications will be sent to the facility for the patient to continue.  When discharged from the skilled rehab facility, please have the facility set up the patient's Rich prior to being released. Also, the skilled facility will be responsible for providing the patient with their medications at time of release from the facility to include their pain medication, the muscle relaxants, and their blood thinner medication. If the patient is still at the rehab facility at time of the two week follow up appointment, the skilled rehab facility will also need to assist the patient in arranging follow up appointment in our office and any transportation needs.  MAKE SURE YOU:  Understand these instructions.  Get help right away if you are not doing well or get worse.    Pick up stool softner and laxative for home  use following surgery while on pain medications. Do not submerge incision under water. Please use good hand washing techniques while changing dressing each day. May shower starting three days after surgery. Please use a clean towel to pat the incision dry following showers. Continue to use ice for pain and swelling after surgery. Do not use any lotions or creams on the incision until instructed by your surgeon.   Do not sit on low chairs, stoools or toilet seats, as it may be difficult to get up from low surfaces   Complete by: As directed    Driving restrictions   Complete by: As directed    No driving for two weeks   TED hose   Complete by: As directed    Use stockings (TED hose) for three weeks on both leg(s).  You may remove them at night  for sleeping.   Weight bearing as tolerated   Complete by: As directed      Allergies as of 03/08/2019      Reactions   Morphine And Related Itching      Medication List    STOP taking these medications   diclofenac 75 MG EC tablet Commonly known as: VOLTAREN   doxycycline 100 MG tablet Commonly known as: VIBRA-TABS   oxyCODONE-acetaminophen 10-325 MG tablet Commonly known as: PERCOCET     TAKE these medications   albuterol (2.5 MG/3ML) 0.083% nebulizer solution Commonly known as: PROVENTIL Inhale 2.5 mg into the lungs every 6 (six) hours as needed for shortness of breath or wheezing.   amLODipine 5 MG tablet Commonly known as: NORVASC Take 5 mg by mouth daily.   aspirin 325 MG EC tablet Take 1 tablet (325 mg total) by mouth 2 (two) times daily for 20 days. Then take one 81 mg aspirin once a day for three weeks. Then discontinue aspirin.   atenolol-chlorthalidone 50-25 MG tablet Commonly known as: TENORETIC Take 1 tablet by mouth daily with breakfast.   blood glucose meter kit and supplies Kit Dispense based on patient and insurance preference. Use up to four times daily as directed. (FOR ICD-9 250.00, 250.01).   doxazosin 8 MG tablet Commonly known as: CARDURA Take 8 mg by mouth daily with breakfast.   furosemide 20 MG tablet Commonly known as: LASIX Take 20 mg by mouth every other day.   gabapentin 800 MG tablet Commonly known as: NEURONTIN Take 800 mg by mouth 4 (four) times daily.   JUICE PLUS FIBRE PO Take 4 capsules by mouth daily.   metFORMIN 500 MG tablet Commonly known as: Glucophage Take 1 tablet (500 mg total) by mouth 2 (two) times daily with a meal. What changed: how much to take   methocarbamol 500 MG tablet Commonly known as: ROBAXIN Take 1 tablet (500 mg total) by mouth every 6 (six) hours as needed for muscle spasms.   oxyCODONE 5 MG immediate release tablet Commonly known as: Oxy IR/ROXICODONE Take 1-2 tablets (5-10 mg total) by  mouth every 6 (six) hours as needed for severe pain.   polyethylene glycol 17 g packet Commonly known as: MIRALAX / GLYCOLAX Take 17 g by mouth daily as needed for mild constipation.   potassium chloride SA 20 MEQ tablet Commonly known as: K-DUR Take 20 mEq by mouth 2 (two) times daily.   traMADol 50 MG tablet Commonly known as: ULTRAM Take 1-2 tablets (50-100 mg total) by mouth every 6 (six) hours as needed for moderate pain. What changed:   how much to take  reasons to take this   traZODone 100 MG tablet Commonly known as: DESYREL Take 100 mg by mouth at bedtime.            Discharge Care Instructions  (From admission, onward)         Start     Ordered   03/07/19 0000  Weight bearing as tolerated     03/07/19 0756   03/07/19 0000  Change dressing    Comments: Change the dressing daily with sterile 4 x 4 inch gauze dressing and paper tape.   03/07/19 0756         Follow-up Information    Gaynelle Arabian, MD. Schedule an appointment as soon as possible for a visit on 03/21/2019.   Specialty: Orthopedic Surgery Contact information: [address] [place] [address] [phone]           Signed: Theresa Duty, PA-C Orthopedic Surgery 03/11/2019, 12:36 PM

## 2019-09-08 ENCOUNTER — Other Ambulatory Visit: Payer: Self-pay

## 2019-09-08 ENCOUNTER — Emergency Department (HOSPITAL_COMMUNITY)
Admission: EM | Admit: 2019-09-08 | Discharge: 2019-09-08 | Disposition: A | Discharge status: Home / Self Care | Payer: Medicare Other | Attending: Emergency Medicine | Admitting: Emergency Medicine

## 2019-09-08 ENCOUNTER — Encounter (HOSPITAL_COMMUNITY): Payer: Self-pay

## 2019-09-08 ENCOUNTER — Emergency Department (HOSPITAL_COMMUNITY): Payer: Medicare Other

## 2019-09-08 DIAGNOSIS — E119 Type 2 diabetes mellitus without complications: Secondary | ICD-10-CM | POA: Diagnosis not present

## 2019-09-08 DIAGNOSIS — I10 Essential (primary) hypertension: Secondary | ICD-10-CM | POA: Insufficient documentation

## 2019-09-08 DIAGNOSIS — Z96641 Presence of right artificial hip joint: Secondary | ICD-10-CM | POA: Insufficient documentation

## 2019-09-08 DIAGNOSIS — R1032 Left lower quadrant pain: Secondary | ICD-10-CM | POA: Diagnosis present

## 2019-09-08 DIAGNOSIS — Z87891 Personal history of nicotine dependence: Secondary | ICD-10-CM | POA: Insufficient documentation

## 2019-09-08 DIAGNOSIS — L03116 Cellulitis of left lower limb: Secondary | ICD-10-CM

## 2019-09-08 DIAGNOSIS — J449 Chronic obstructive pulmonary disease, unspecified: Secondary | ICD-10-CM | POA: Insufficient documentation

## 2019-09-08 DIAGNOSIS — Z79899 Other long term (current) drug therapy: Secondary | ICD-10-CM | POA: Diagnosis not present

## 2019-09-08 LAB — CBC WITH DIFFERENTIAL/PLATELET
Abs Immature Granulocytes: 0.04 10*3/uL (ref 0.00–0.07)
Basophils Absolute: 0.1 10*3/uL (ref 0.0–0.1)
Basophils Relative: 1 %
Eosinophils Absolute: 0.1 10*3/uL (ref 0.0–0.5)
Eosinophils Relative: 2 %
HCT: 39.3 % (ref 39.0–52.0)
Hemoglobin: 13 g/dL (ref 13.0–17.0)
Immature Granulocytes: 1 %
Lymphocytes Relative: 28 %
Lymphs Abs: 2.3 10*3/uL (ref 0.7–4.0)
MCH: 29.5 pg (ref 26.0–34.0)
MCHC: 33.1 g/dL (ref 30.0–36.0)
MCV: 89.1 fL (ref 80.0–100.0)
Monocytes Absolute: 0.8 10*3/uL (ref 0.1–1.0)
Monocytes Relative: 9 %
Neutro Abs: 5.1 10*3/uL (ref 1.7–7.7)
Neutrophils Relative %: 59 %
Platelets: 269 10*3/uL (ref 150–400)
RBC: 4.41 MIL/uL (ref 4.22–5.81)
RDW: 13.6 % (ref 11.5–15.5)
WBC: 8.4 10*3/uL (ref 4.0–10.5)
nRBC: 0 % (ref 0.0–0.2)

## 2019-09-08 LAB — URINALYSIS, ROUTINE W REFLEX MICROSCOPIC
Bilirubin Urine: NEGATIVE
Glucose, UA: 150 mg/dL — AB
Hgb urine dipstick: NEGATIVE
Ketones, ur: NEGATIVE mg/dL
Leukocytes,Ua: NEGATIVE
Nitrite: NEGATIVE
Protein, ur: NEGATIVE mg/dL
Specific Gravity, Urine: 1.021 (ref 1.005–1.030)
pH: 6 (ref 5.0–8.0)

## 2019-09-08 LAB — COMPREHENSIVE METABOLIC PANEL
ALT: 26 U/L (ref 0–44)
AST: 18 U/L (ref 15–41)
Albumin: 4.1 g/dL (ref 3.5–5.0)
Alkaline Phosphatase: 78 U/L (ref 38–126)
Anion gap: 11 (ref 5–15)
BUN: 35 mg/dL — ABNORMAL HIGH (ref 8–23)
CO2: 26 mmol/L (ref 22–32)
Calcium: 9.3 mg/dL (ref 8.9–10.3)
Chloride: 102 mmol/L (ref 98–111)
Creatinine, Ser: 1.07 mg/dL (ref 0.61–1.24)
GFR calc Af Amer: >60 mL/min (ref >60)
GFR calc non Af Amer: >60 mL/min (ref >60)
Glucose, Bld: 161 mg/dL — ABNORMAL HIGH (ref 70–99)
Potassium: 3.6 mmol/L (ref 3.5–5.1)
Sodium: 139 mmol/L (ref 135–145)
Total Bilirubin: 0.3 mg/dL (ref 0.3–1.2)
Total Protein: 6.9 g/dL (ref 6.5–8.1)

## 2019-09-08 LAB — LACTIC ACID, PLASMA: Lactic Acid, Venous: 2 mmol/L (ref 0.5–1.9)

## 2019-09-08 MED ORDER — IOHEXOL 300 MG/ML  SOLN
100.0000 mL | Freq: Once | INTRAMUSCULAR | Status: AC | PRN
  Administered 2019-09-08: 100 mL via INTRAVENOUS

## 2019-09-08 MED ORDER — SODIUM CHLORIDE (PF) 0.9 % IJ SOLN
INTRAMUSCULAR | Status: AC
  Filled 2019-09-08: qty 50

## 2019-09-08 MED ORDER — SODIUM CHLORIDE 0.9 % IV BOLUS
500.0000 mL | Freq: Once | INTRAVENOUS | Status: AC
  Administered 2019-09-08: 500 mL via INTRAVENOUS

## 2019-09-08 MED ORDER — SULFAMETHOXAZOLE-TRIMETHOPRIM 800-160 MG PO TABS: 1.0000 | ORAL_TABLET | Freq: Two times a day (BID) | ORAL | 0 refills | Status: AC

## 2019-09-08 NOTE — ED Triage Notes (Signed)
Pt coming in c/o possible infection to his testicles. Noticed it was red, swollen and pus was draining out. Stated this happened approx 1 year ago after surgery.

## 2019-09-08 NOTE — ED Notes (Addendum)
Date and time results received: 09/08/19 2125 (use smartphrase ".now" to insert current time)  Test: lactic acid Critical Value: 2.0  Name of Provider Notified: Regenia Skeeter md Orders Received? Or Actions Taken?:waiting on orders

## 2019-09-08 NOTE — ED Provider Notes (Signed)
Patrick Aguilar   CSN: [id number] Arrival date & time: 09/08/19  1937     History Chief Complaint  Patient presents with  . testicular infection    Patrick Aguilar is a 68 y.o. 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  States that he has had a "knot" in his left upper thigh area near his groin for the past several months.  States that "it usually does not bother me."  Noticed 3 days ago that he had worsening dull ache in this area.  2 days ago had progressive redness and "bursting" of the knot with purulent drainage.  He called his urologist today who told him to be evaluated the ED due to his history of Fournier's.  Denies any fevers, urinary symptoms, abdominal pain, vomiting, diarrhea or trauma to the area.  HPI     Past Medical History:  Diagnosis Date  . AKI (acute kidney injury) ([place]) 12/2017  . Anxiety   . Arthritis   . Complication of anesthesia    low respirations, low BP  . COPD (chronic obstructive pulmonary disease) ([place])   . DDD (degenerative disc disease), lumbar   . Diabetes mellitus without complication ([place])    type 2  . Diastolic dysfunction 23/53/6144   Moderate noted on ECHO  . Fournier's gangrene in male   . GERD (gastroesophageal reflux disease)   . History of ARDS   . History of necrotizing fasciitis    Severe  . Hypertension   . Lumbar spondylosis   . Numbness and tingling of both upper extremities   . Pulmonary hypertension ([place]) 12/14/2017   Mild, noted on ECHO  . Respiratory failure, acute ([place]) 12/2017  . Septic shock ([place]) 12/2017  . Shortness of breath dyspnea   . Testicular pain, left   . Wears glasses     Patient Active Problem List   Diagnosis Date Noted  . OA (osteoarthritis) of hip 03/06/2019  . Osteoarthritis of right hip 03/06/2019  . Cellulitis of left leg 07/08/2018  . Type 2  diabetes mellitus without complication ([place]) 31/54/0086  . Non-healing left groin open wound, initial encounter 01/08/2018  . HTN (hypertension) 12/21/2017  . COPD (chronic obstructive pulmonary disease) ([place]) 12/21/2017  . Fournier gangrene   . Sialolithiasis of submandibular gland 03/12/2015    Past Surgical History:  Procedure Laterality Date  . APPENDECTOMY    . APPLICATION OF A-CELL OF CHEST/ABDOMEN N/A 01/08/2018   Procedure: APPLICATION OF A-CELL OF GROIN;  Surgeon: Wallace Going, DO;  Location: [facility];  Service: Plastics;  Laterality: N/A;  . APPLICATION OF A-CELL OF EXTREMITY N/A 12/25/2017   Procedure: APPLICATION OF A-CELL;  Surgeon: Wallace Going, DO;  Location: [facility];  Service: Plastics;  Laterality: N/A;  . BACK SURGERY     x5  . CARDIAC CATHETERIZATION  06   neg  . CERVICAL DISC SURGERY     x2  . COLONOSCOPY    . DEBRIDEMENT AND CLOSURE WOUND N/A 01/26/2018   Procedure: REVISION OF PERINEUM WOUND WITH DEBRIDEMENT, PARTIAL CLOSURE OF PERINEUM, PLACEMENT OF [place];  Surgeon: Wallace Going, DO;  Location: [facility];  Service: Plastics;  Laterality: N/A;  . DENTAL SURGERY     teeth extractions  . groin wound  01/08/2018   : EXCISION OF GROIN WOUND WITH PLACEMENT OF ACELL, AND PRIMARY WOUND CLOSURE (N/A Scrotum)  .  I & D EXTREMITY N/A 03/12/2018   Procedure: IRRIGATION AND DEBRIDEMENT PERIMUM WOUND WITH CLOSURE;  Surgeon: Wallace Going, DO;  Location: [facility];  Service: Plastics;  Laterality: N/A;  . INCISION AND DRAINAGE ABSCESS Left 07/11/2018   Procedure: INCISION AND DRAINAGE ABSCESS LEFT THIGH;  Surgeon: Franchot Gallo, MD;  Location: [facility];  Service: Urology;  Laterality: Left;  . INCISION AND DRAINAGE OF WOUND N/A 12/25/2017   Procedure: Irrigation and debridement of Fournier's of scrotum with placement of testes in subcutaneous thigh pockets and Acell placement;  Surgeon: Wallace Going, DO;  Location: [facility];  Service:  Plastics;  Laterality: N/A;  . INCISION AND DRAINAGE OF WOUND N/A 01/08/2018   Procedure: EXCISION OF GROIN WOUND WITH PLACEMENT OF ACELL, AND PRIMARY WOUND CLOSURE;  Surgeon: Wallace Going, DO;  Location: [facility];  Service: Plastics;  Laterality: N/A;  . ORCHIECTOMY N/A 12/13/2017   Procedure: EXCISION OF SCROTUM AND DEBRIDEMENT OF PENIS;  Surgeon: Franchot Gallo, MD;  Location: [facility];  Service: Urology;  Laterality: N/A;  . ORCHIECTOMY Left 06/22/2018   Procedure: ORCHIECTOMY;  Surgeon: Franchot Gallo, MD;  Location: [facility];  Service: Urology;  Laterality: Left;  . PLANTAR FASCIA SURGERY Bilateral   . shoulders Bilateral    rotator cuff  . SUBMANDIBULAR GLAND EXCISION Left 03/12/2015   Procedure: LEFT SUBMANDIBULAR GLAND RESECTION;  Surgeon: Melida Quitter, MD;  Location: [facility];  Service: ENT;  Laterality: Left;  . TONSILLECTOMY    . TOTAL HIP ARTHROPLASTY Right 03/06/2019   Procedure: TOTAL HIP ARTHROPLASTY ANTERIOR APPROACH;  Surgeon: Gaynelle Arabian, MD;  Location: [facility];  Service: Orthopedics;  Laterality: Right;  156mn       Family History  Problem Relation Age of Onset  . Lung cancer Mother   . Bladder Cancer Father     Social History   Tobacco Use  . Smoking status: Former Smoker    Packs/day: 1.00    Years: 50.00    Pack years: 50.00    Types: Cigarettes    Quit date: 12/13/2017    Years since quitting: 1.7  . Smokeless tobacco: Never Used  Substance Use Topics  . Alcohol use: No    Comment: quit alcohol 80's  . Drug use: No    Home Medications Prior to Admission medications   Medication Sig Start Date End Date Taking? Authorizing Provider  albuterol (PROVENTIL) (2.5 MG/3ML) 0.083% nebulizer solution Inhale 2.5 mg into the lungs every 6 (six) hours as needed for shortness of breath or wheezing. 12/06/14  Yes [provider]  amLODipine (NORVASC) 5 MG tablet Take 5 mg by mouth daily.   Yes [provider]   atenolol-chlorthalidone (TENORETIC) 50-25 MG tablet Take 1 tablet by mouth daily with breakfast.  02/09/15  Yes [provider]  blood glucose meter kit and supplies KIT Dispense based on patient and insurance preference. Use up to four times daily as directed. (FOR ICD-9 250.00, 250.01). 12/28/17  Yes XFlorencia Reasons MD  diclofenac (VOLTAREN) 75 MG EC tablet Take 75 mg by mouth 2 (two) times daily. 08/16/19  Yes [provider]  doxazosin (CARDURA) 8 MG tablet Take 8 mg by mouth daily with breakfast.  02/10/15  Yes [provider]  famotidine (PEPCID) 20 MG tablet Take 20 mg by mouth 2 (two) times daily. 05/31/19  Yes [provider]  furosemide (LASIX) 20 MG tablet Take 20 mg by mouth daily after breakfast.    Yes [provider]  gabapentin (NEURONTIN) 800 MG tablet Take 800 mg by mouth every 6 (six) hours.  02/09/15  Yes [provider]  metFORMIN (GLUCOPHAGE) 500 MG tablet Take 1 tablet (500 mg total) by mouth 2 (two) times daily with a meal. Patient taking differently: Take 1,000 mg by mouth 2 (two) times daily with a meal.  12/28/17 09/08/19 Yes Florencia Reasons, MD  Nutritional Supplements (JUICE PLUS FIBRE PO) Take 4 capsules by mouth daily after breakfast.    Yes [provider]  oxyCODONE-acetaminophen (PERCOCET) 10-325 MG tablet Take 1 tablet by mouth every 6 (six) hours. 08/12/19  Yes [provider]  polyethylene glycol (MIRALAX / GLYCOLAX) packet Take 17 g by mouth daily as needed for mild constipation. Patient taking differently: Take 17 g by mouth daily after breakfast.  01/12/18  Yes Rayburn, Neta Mends, PA-C  potassium chloride SA (K-DUR,KLOR-CON) 20 MEQ tablet Take 40 mEq by mouth daily after breakfast.  02/09/15  Yes [provider]  rosuvastatin (CRESTOR) 5 MG tablet Take 5 mg by mouth every Monday.  07/11/19  Yes [provider]  traZODone (DESYREL) 100 MG tablet Take 100 mg by mouth at bedtime.  11/29/17   Yes [provider]  methocarbamol (ROBAXIN) 500 MG tablet Take 1 tablet (500 mg total) by mouth every 6 (six) hours as needed for muscle spasms. Patient not taking: Reported on 09/08/2019 03/08/19   Edmisten, Drue Dun L, PA  oxyCODONE (OXY IR/ROXICODONE) 5 MG immediate release tablet Take 1-2 tablets (5-10 mg total) by mouth every 6 (six) hours as needed for severe pain. Patient not taking: Reported on 09/08/2019 03/08/19   Edmisten, Drue Dun L, PA  sulfamethoxazole-trimethoprim (BACTRIM DS) 800-160 MG tablet Take 1 tablet by mouth 2 (two) times daily for 7 days. 09/08/19 09/15/19  Joscelin Fray, PA-C  traMADol (ULTRAM) 50 MG tablet Take 1-2 tablets (50-100 mg total) by mouth every 6 (six) hours as needed for moderate pain. Patient not taking: Reported on 09/08/2019 03/08/19   Derl Barrow, PA    Allergies    Adenosine, Bupropion, and Morphine and related  Review of Systems   Review of Systems  Constitutional: Negative for appetite change, chills and fever.  HENT: Negative for ear pain, rhinorrhea, sneezing and sore throat.   Eyes: Negative for photophobia and visual disturbance.  Respiratory: Negative for cough, chest tightness, shortness of breath and wheezing.   Cardiovascular: Negative for chest pain and palpitations.  Gastrointestinal: Negative for abdominal pain, blood in stool, constipation, diarrhea, nausea and vomiting.  Genitourinary: Negative for dysuria, hematuria and urgency.  Musculoskeletal: Negative for myalgias.  Skin: Positive for color change and wound. Negative for rash.  Neurological: Negative for dizziness, weakness and light-headedness.    Physical Exam Updated Vital Signs BP (!) 159/91   Pulse 73   Temp 99.5 F (37.5 C) (Oral)   Resp 14   Ht _0  (1.727 m)   Wt 83.9 kg   SpO2 98%   BMI 28.13 kg/m   Physical Exam Vitals and nursing Aguilar reviewed. Exam conducted with a chaperone present.  Constitutional:      General: He is not in acute  distress.    Appearance: He is well-developed.  HENT:     Head: Normocephalic and atraumatic.     Nose: Nose normal.  Eyes:     General: No scleral icterus.       Left eye: No discharge.     Conjunctiva/sclera: Conjunctivae normal.  Cardiovascular:     Rate  and Rhythm: Normal rate and regular rhythm.     Heart sounds: Normal heart sounds. No murmur. No friction rub. No gallop.   Pulmonary:     Effort: Pulmonary effort is normal. No respiratory distress.     Breath sounds: Normal breath sounds.  Abdominal:     General: Bowel sounds are normal. There is no distension.     Palpations: Abdomen is soft.     Tenderness: There is no abdominal tenderness. There is no guarding.  Musculoskeletal:        General: Normal range of motion.     Cervical back: Normal range of motion and neck supple.  Skin:    General: Skin is warm and dry.     Findings: Erythema present. No rash.     Comments: Palpable nodule or mass of the left upper thigh as noted in the image.  Surrounding erythema and tenderness. Testicles not visible. Penis wnl.  Neurological:     Mental Status: He is alert.     Motor: No abnormal muscle tone.     Coordination: Coordination normal.       ED Results / Procedures / Treatments   Labs (all labs ordered are listed, but only abnormal results are displayed) Labs Reviewed  COMPREHENSIVE METABOLIC PANEL - Abnormal; Notable for the following components:      Result Value   Glucose, Bld 161 (*)    BUN 35 (*)    All other components within normal limits  LACTIC ACID, PLASMA - Abnormal; Notable for the following components:   Lactic Acid, Venous 2.0 (*)    All other components within normal limits  URINALYSIS, ROUTINE W REFLEX MICROSCOPIC - Abnormal; Notable for the following components:   Glucose, UA 150 (*)    All other components within normal limits  CULTURE, BLOOD (ROUTINE X 2)  CULTURE, BLOOD (ROUTINE X 2)  URINE CULTURE  CBC WITH DIFFERENTIAL/PLATELET  LACTIC  ACID, PLASMA    EKG None  Radiology CT PELVIS W CONTRAST  Addendum Date: 09/08/2019   ADDENDUM REPORT: 09/08/2019 23:00 ADDENDUM: Upon further discussion with the referring clinician the patient has had a prior relocation of the right testicle into the upper anteromedial thigh. The previously thought collection is likely the testicle. There is skin thickening along the medial left and right upper thigh with mild inflammatory changes. No loculated fluid collections or subcutaneous emphysema is noted. Electronically Signed   By: Prudencio Pair M.D.   On: 09/08/2019 23:00   Result Date: 09/08/2019 CLINICAL DATA:  History of Fournier's now with drainage, possible infection to testicle EXAM: CT PELVIS WITH CONTRAST TECHNIQUE: Multidetector CT imaging of the pelvis was performed using the standard protocol following the bolus administration of intravenous contrast. CONTRAST:  16m OMNIPAQUE IOHEXOL 300 MG/ML  SOLN COMPARISON:  December 13, 2017 FINDINGS: Urinary Tract: The visualized distal ureters and bladder appear unremarkable. Bowel: No bowel wall thickening, distention or surrounding inflammation identified within the pelvis. Vascular/Lymphatic: No enlarged pelvic lymph nodes identified. Scattered aortic atherosclerotic calcifications are seen without aneurysmal dilatation. Reproductive: The prostate is unremarkable. Other: Within the right inguinal canal extending into the medial upper right thigh there is a loculated fluid collection measuring 3.5 x 1.8 x 4.4 cm within the subcutaneous soft tissues. No subcutaneous emphysema is seen. There is overlying skin thickening. Musculoskeletal: No acute or significant osseous findings. The patient is status post right total hip arthroplasty. Degenerative changes are seen in the lower lumbar spine. The patient is status post fixation at  L5-S1. Incomplete osseous fusion seen across the interbody spacer as on prior exam dating back to 2019. IMPRESSION: Extending from  the right inguinal canal into the medial lower right upper thigh subcutaneous tissues there is a loculated fluid collection measuring 3.5 x 1.8 x 4.4 cm which could represent phlegmon/abscess with overlying cellulitis. Electronically Signed: By: Prudencio Pair M.D. On: 09/08/2019 22:09    Procedures Procedures (including critical care time)  Medications Ordered in ED Medications  sodium chloride (PF) 0.9 % injection (has no administration in time range)  iohexol (OMNIPAQUE) 300 MG/ML solution 100 mL (100 mLs Intravenous Contrast Given 09/08/19 2146)  sodium chloride 0.9 % bolus 500 mL (500 mLs Intravenous New Bag/Given 09/08/19 2219)    ED Course  I have reviewed the triage vital signs and the nursing notes.  Pertinent labs & imaging results that were available during my care of the patient were reviewed by me and considered in my medical decision making (see chart for details).  Clinical Course as of Sep 07 2341  Nancy Fetter Sep 08, 2019  2226 CT PELVIS W CONTRAST [HK]  2256 Spoke to radiologist Dr. Westley Chandler. It appears that the finding in the right thigh is his testicle. In regards to the L thigh, she sees some cellulitic changes without obvious abscess.   [HK]  2333 Spoke to Dr. Noah Delaine of urology.  Feels that it is reasonable to treat patient with antibiotics and follow-up in the clinic.   [HK]    Clinical Course User Index [HK] Delia Heady, PA-C   MDM Rules/Calculators/A&P                      68 year old male with past medical history of Fournier's, left orchiectomy with several debridements and I&D's by plastics and urology presenting to the ED for left thigh infection.  He has noted a knot in this area for the past several months but 2 days ago started having worsening pain and then drainage yesterday.  Denies fever.  He was initially unsure if he had his right testicle also removed.  Lab work here shows normal WBC count, lactic slightly elevated at 2.0 for which he was given IV fluids.   Blood cultures obtained.  Urinalysis is unremarkable.  CT of the pelvis shows fluid filled sac in the R inguinal canal. On reevaluation the patient believes that his right testicle was inserted into his R leg, and on exam it appears that this is true as there is a testicle like mass in his R thigh.  I personally spoke to the radiologist who sees cellulitic changes of the left thigh without abscess.  Per urology recommendations will place patient on Bactrim and have him follow-up in the clinic.  Patient is agreeable to the plan.  His vital signs are reassuring here. Patient discussed with and seen by my attending, Dr. Regenia Skeeter.  Patient is hemodynamically stable, in NAD, and able to ambulate in the ED. Evaluation does not show pathology that would require ongoing emergent intervention or inpatient treatment. I explained the diagnosis to the patient. Pain has been managed and has no complaints prior to discharge. Patient is comfortable with above plan and is stable for discharge at this time. All questions were answered prior to disposition. Strict return precautions for returning to the ED were discussed. Encouraged follow up with PCP.   An After Visit Summary was printed and given to the patient.   Portions of this Aguilar were generated with [employer]. Dictation  errors may occur despite best attempts at proofreading.   Final Clinical Impression(s) / ED Diagnoses Final diagnoses:  Cellulitis of left lower extremity    Rx / DC Orders ED Discharge Orders         Ordered    sulfamethoxazole-trimethoprim (BACTRIM DS) 800-160 MG tablet  2 times daily     09/08/19 2337           Delia Heady, PA-C 09/08/19 2342    Sherwood Gambler, MD 09/12/19 [phone]

## 2019-09-08 NOTE — Discharge Instructions (Addendum)
Take the antibiotics as directed. It is important for you to take the entire course of antibiotics regardless of symptom improvement to prevent worsening or recurrence of your infection. Return to the ED if you start to have worsening symptoms, worsening redness, fevers, drainage.

## 2019-09-08 NOTE — ED Notes (Signed)
Urine and culture sent down to lab 

## 2019-09-09 LAB — URINE CULTURE: Culture: <10000 — AB

## 2019-09-14 LAB — CULTURE, BLOOD (ROUTINE X 2)
Culture: NO GROWTH
Culture: NO GROWTH
Special Requests: ADEQUATE

## 2019-09-17 DIAGNOSIS — E1142 Type 2 diabetes mellitus with diabetic polyneuropathy: Secondary | ICD-10-CM | POA: Diagnosis not present

## 2019-09-17 DIAGNOSIS — Z87891 Personal history of nicotine dependence: Secondary | ICD-10-CM | POA: Diagnosis not present

## 2019-09-17 DIAGNOSIS — J449 Chronic obstructive pulmonary disease, unspecified: Secondary | ICD-10-CM | POA: Diagnosis not present

## 2019-09-17 DIAGNOSIS — Z299 Encounter for prophylactic measures, unspecified: Secondary | ICD-10-CM | POA: Diagnosis not present

## 2019-09-17 DIAGNOSIS — E1165 Type 2 diabetes mellitus with hyperglycemia: Secondary | ICD-10-CM | POA: Diagnosis not present

## 2019-09-19 DIAGNOSIS — J449 Chronic obstructive pulmonary disease, unspecified: Secondary | ICD-10-CM | POA: Diagnosis not present

## 2019-09-19 DIAGNOSIS — E119 Type 2 diabetes mellitus without complications: Secondary | ICD-10-CM | POA: Diagnosis not present

## 2019-10-03 DIAGNOSIS — U071 COVID-19: Secondary | ICD-10-CM | POA: Diagnosis not present

## 2019-10-03 DIAGNOSIS — I10 Essential (primary) hypertension: Secondary | ICD-10-CM | POA: Diagnosis not present

## 2019-10-03 DIAGNOSIS — Z87891 Personal history of nicotine dependence: Secondary | ICD-10-CM | POA: Diagnosis not present

## 2019-10-03 DIAGNOSIS — Z8616 Personal history of COVID-19: Secondary | ICD-10-CM | POA: Diagnosis not present

## 2019-10-03 DIAGNOSIS — I2699 Other pulmonary embolism without acute cor pulmonale: Secondary | ICD-10-CM | POA: Diagnosis not present

## 2019-10-03 DIAGNOSIS — R0902 Hypoxemia: Secondary | ICD-10-CM | POA: Diagnosis not present

## 2019-10-03 DIAGNOSIS — E119 Type 2 diabetes mellitus without complications: Secondary | ICD-10-CM | POA: Diagnosis not present

## 2019-10-03 DIAGNOSIS — J849 Interstitial pulmonary disease, unspecified: Secondary | ICD-10-CM | POA: Diagnosis not present

## 2019-10-03 DIAGNOSIS — J440 Chronic obstructive pulmonary disease with acute lower respiratory infection: Secondary | ICD-10-CM | POA: Diagnosis not present

## 2019-10-03 DIAGNOSIS — Z20822 Contact with and (suspected) exposure to covid-19: Secondary | ICD-10-CM | POA: Diagnosis not present

## 2019-10-03 DIAGNOSIS — R0602 Shortness of breath: Secondary | ICD-10-CM | POA: Diagnosis not present

## 2019-10-10 DIAGNOSIS — U071 COVID-19: Secondary | ICD-10-CM | POA: Diagnosis not present

## 2019-10-10 DIAGNOSIS — I10 Essential (primary) hypertension: Secondary | ICD-10-CM | POA: Diagnosis not present

## 2019-10-10 DIAGNOSIS — Z87891 Personal history of nicotine dependence: Secondary | ICD-10-CM | POA: Diagnosis not present

## 2019-10-10 DIAGNOSIS — J449 Chronic obstructive pulmonary disease, unspecified: Secondary | ICD-10-CM | POA: Diagnosis not present

## 2019-10-10 DIAGNOSIS — I2699 Other pulmonary embolism without acute cor pulmonale: Secondary | ICD-10-CM | POA: Diagnosis not present

## 2019-10-10 DIAGNOSIS — Z299 Encounter for prophylactic measures, unspecified: Secondary | ICD-10-CM | POA: Diagnosis not present

## 2019-10-11 DIAGNOSIS — L02818 Cutaneous abscess of other sites: Secondary | ICD-10-CM | POA: Diagnosis not present

## 2019-10-17 DIAGNOSIS — D6869 Other thrombophilia: Secondary | ICD-10-CM | POA: Diagnosis not present

## 2019-10-17 DIAGNOSIS — I2699 Other pulmonary embolism without acute cor pulmonale: Secondary | ICD-10-CM | POA: Diagnosis not present

## 2019-10-17 DIAGNOSIS — I10 Essential (primary) hypertension: Secondary | ICD-10-CM | POA: Diagnosis not present

## 2019-10-17 DIAGNOSIS — Z299 Encounter for prophylactic measures, unspecified: Secondary | ICD-10-CM | POA: Diagnosis not present

## 2019-10-17 DIAGNOSIS — E1165 Type 2 diabetes mellitus with hyperglycemia: Secondary | ICD-10-CM | POA: Diagnosis not present

## 2019-10-17 DIAGNOSIS — Z87891 Personal history of nicotine dependence: Secondary | ICD-10-CM | POA: Diagnosis not present

## 2019-10-17 LAB — HEMOGLOBIN A1C: Hemoglobin A1C: 6.1

## 2019-11-06 DIAGNOSIS — E119 Type 2 diabetes mellitus without complications: Secondary | ICD-10-CM | POA: Diagnosis not present

## 2019-11-06 DIAGNOSIS — J449 Chronic obstructive pulmonary disease, unspecified: Secondary | ICD-10-CM | POA: Diagnosis not present

## 2019-11-07 DIAGNOSIS — U071 COVID-19: Secondary | ICD-10-CM | POA: Diagnosis not present

## 2019-11-07 DIAGNOSIS — R0902 Hypoxemia: Secondary | ICD-10-CM | POA: Diagnosis not present

## 2019-12-02 DIAGNOSIS — E119 Type 2 diabetes mellitus without complications: Secondary | ICD-10-CM | POA: Diagnosis not present

## 2019-12-02 DIAGNOSIS — J449 Chronic obstructive pulmonary disease, unspecified: Secondary | ICD-10-CM | POA: Diagnosis not present

## 2019-12-04 DIAGNOSIS — G894 Chronic pain syndrome: Secondary | ICD-10-CM | POA: Diagnosis not present

## 2019-12-04 DIAGNOSIS — Z0000 Encounter for general adult medical examination without abnormal findings: Secondary | ICD-10-CM | POA: Diagnosis not present

## 2019-12-04 DIAGNOSIS — Z1211 Encounter for screening for malignant neoplasm of colon: Secondary | ICD-10-CM | POA: Diagnosis not present

## 2019-12-04 DIAGNOSIS — Z79899 Other long term (current) drug therapy: Secondary | ICD-10-CM | POA: Diagnosis not present

## 2019-12-04 DIAGNOSIS — E7800 Pure hypercholesterolemia, unspecified: Secondary | ICD-10-CM | POA: Diagnosis not present

## 2019-12-04 DIAGNOSIS — M47816 Spondylosis without myelopathy or radiculopathy, lumbar region: Secondary | ICD-10-CM | POA: Diagnosis not present

## 2019-12-04 DIAGNOSIS — R5383 Other fatigue: Secondary | ICD-10-CM | POA: Diagnosis not present

## 2019-12-04 DIAGNOSIS — Z7189 Other specified counseling: Secondary | ICD-10-CM | POA: Diagnosis not present

## 2019-12-04 DIAGNOSIS — Z299 Encounter for prophylactic measures, unspecified: Secondary | ICD-10-CM | POA: Diagnosis not present

## 2019-12-04 DIAGNOSIS — M160 Bilateral primary osteoarthritis of hip: Secondary | ICD-10-CM | POA: Diagnosis not present

## 2019-12-04 DIAGNOSIS — E1165 Type 2 diabetes mellitus with hyperglycemia: Secondary | ICD-10-CM | POA: Diagnosis not present

## 2019-12-05 DIAGNOSIS — U071 COVID-19: Secondary | ICD-10-CM | POA: Diagnosis not present

## 2019-12-05 DIAGNOSIS — R0902 Hypoxemia: Secondary | ICD-10-CM | POA: Diagnosis not present

## 2019-12-26 DIAGNOSIS — M47816 Spondylosis without myelopathy or radiculopathy, lumbar region: Secondary | ICD-10-CM | POA: Diagnosis not present

## 2019-12-30 DIAGNOSIS — J449 Chronic obstructive pulmonary disease, unspecified: Secondary | ICD-10-CM | POA: Diagnosis not present

## 2019-12-30 DIAGNOSIS — E119 Type 2 diabetes mellitus without complications: Secondary | ICD-10-CM | POA: Diagnosis not present

## 2020-01-05 DIAGNOSIS — R0902 Hypoxemia: Secondary | ICD-10-CM | POA: Diagnosis not present

## 2020-01-05 DIAGNOSIS — U071 COVID-19: Secondary | ICD-10-CM | POA: Diagnosis not present

## 2020-01-07 DIAGNOSIS — M47816 Spondylosis without myelopathy or radiculopathy, lumbar region: Secondary | ICD-10-CM | POA: Diagnosis not present

## 2020-01-14 ENCOUNTER — Other Ambulatory Visit: Payer: Self-pay

## 2020-01-14 ENCOUNTER — Encounter: Payer: Self-pay | Admitting: "Endocrinology

## 2020-01-14 ENCOUNTER — Ambulatory Visit: Payer: Medicare Other | Admitting: "Endocrinology

## 2020-01-14 VITALS — BP 100/63 | HR 79 | Ht 68.0 in | Wt 189.0 lb

## 2020-01-14 DIAGNOSIS — I10 Essential (primary) hypertension: Secondary | ICD-10-CM | POA: Diagnosis not present

## 2020-01-14 DIAGNOSIS — E782 Mixed hyperlipidemia: Secondary | ICD-10-CM

## 2020-01-14 DIAGNOSIS — E119 Type 2 diabetes mellitus without complications: Secondary | ICD-10-CM

## 2020-01-14 LAB — POCT GLYCOSYLATED HEMOGLOBIN (HGB A1C): Hemoglobin A1C: 6.6 % — AB (ref 4.0–5.6)

## 2020-01-14 NOTE — Progress Notes (Signed)
Endocrinology Consult Note       01/14/2020, 4:06 PM   Subjective:    Patient ID: Patrick Aguilar, male    DOB: 1951-05-18.  ARTAVIS COWIE is being seen in consultation for management of currently uncontrolled symptomatic diabetes requested by  Monico Blitz, MD.   Past Medical History:  Diagnosis Date  . AKI (acute kidney injury) ([place]) 12/2017  . Anxiety   . Arthritis   . Complication of anesthesia    low respirations, low BP  . COPD (chronic obstructive pulmonary disease) ([place])   . DDD (degenerative disc disease), lumbar   . Diabetes mellitus without complication ([place])    type 2  . Diastolic dysfunction 92/42/6834   Moderate noted on ECHO  . Fournier's gangrene in male   . GERD (gastroesophageal reflux disease)   . History of ARDS   . History of necrotizing fasciitis    Severe  . Hypertension   . Lumbar spondylosis   . Numbness and tingling of both upper extremities   . Pulmonary hypertension ([place]) 12/14/2017   Mild, noted on ECHO  . Respiratory failure, acute ([place]) 12/2017  . Septic shock ([place]) 12/2017  . Shortness of breath dyspnea   . Testicular pain, left   . Wears glasses     Past Surgical History:  Procedure Laterality Date  . APPENDECTOMY    . APPLICATION OF A-CELL OF CHEST/ABDOMEN N/A 01/08/2018   Procedure: APPLICATION OF A-CELL OF GROIN;  Surgeon: Wallace Going, DO;  Location: [facility];  Service: Plastics;  Laterality: N/A;  . APPLICATION OF A-CELL OF EXTREMITY N/A 12/25/2017   Procedure: APPLICATION OF A-CELL;  Surgeon: Wallace Going, DO;  Location: [facility];  Service: Plastics;  Laterality: N/A;  . BACK SURGERY     x5  . CARDIAC CATHETERIZATION  06   neg  . CERVICAL DISC SURGERY     x2  . COLONOSCOPY    . DEBRIDEMENT AND CLOSURE WOUND N/A 01/26/2018   Procedure: REVISION OF PERINEUM WOUND WITH DEBRIDEMENT, PARTIAL CLOSURE OF PERINEUM, PLACEMENT OF [place];  Surgeon: Wallace Going, DO;  Location: [facility];  Service: Plastics;  Laterality: N/A;  . DENTAL SURGERY     teeth extractions  . groin wound  01/08/2018   : EXCISION OF GROIN WOUND WITH PLACEMENT OF ACELL, AND PRIMARY WOUND CLOSURE (N/A Scrotum)  . I & D EXTREMITY N/A 03/12/2018   Procedure: IRRIGATION AND DEBRIDEMENT PERIMUM WOUND WITH CLOSURE;  Surgeon: Wallace Going, DO;  Location: [facility];  Service: Plastics;  Laterality: N/A;  . INCISION AND DRAINAGE ABSCESS Left 07/11/2018   Procedure: INCISION AND DRAINAGE ABSCESS LEFT THIGH;  Surgeon: Franchot Gallo, MD;  Location[facility];  Service: Urology;  Laterality: Left;  . INCISION AND DRAINAGE OF WOUND N/A 12/25/2017   Procedure: Irrigation and debridement of Fournier's of scrotum with placement of testes in subcutaneous thigh pockets and Acell placement;  Surgeon: Wallace Going, DO;  Location: [facility];  Service: Plastics;  Laterality: N/A;  . INCISION AND DRAINAGE OF WOUND N/A 01/08/2018   Procedure: EXCISION OF GROIN WOUND WITH PLACEMENT OF ACELL,  AND PRIMARY WOUND CLOSURE;  Surgeon: Wallace Going, DO;  Location: [facility];  Service: Plastics;  Laterality: N/A;  . ORCHIECTOMY N/A 12/13/2017   Procedure: EXCISION OF SCROTUM AND DEBRIDEMENT OF PENIS;  Surgeon: Franchot Gallo, MD;  Location[facility];  Service: Urology;  Laterality: N/A;  . ORCHIECTOMY Left 06/22/2018   Procedure: ORCHIECTOMY;  Surgeon: Franchot Gallo, MD;  Location: [facility];  Service: Urology;  Laterality: Left;  . PLANTAR FASCIA SURGERY Bilateral   . shoulders Bilateral    rotator cuff  . SUBMANDIBULAR GLAND EXCISION Left 03/12/2015   Procedure: LEFT SUBMANDIBULAR GLAND RESECTION;  Surgeon: Melida Quitter, MD;  Location: [facility];  Service: ENT;  Laterality: Left;  . TONSILLECTOMY    . TOTAL HIP ARTHROPLASTY Right 03/06/2019   Procedure: TOTAL HIP ARTHROPLASTY ANTERIOR APPROACH;  Surgeon: Gaynelle Arabian, MD;  Location[facility];   Service: Orthopedics;  Laterality: Right;  1101mn    Social History   Socioeconomic History  . Marital status: Single    Spouse name: Not on file  . Number of children: Not on file  . Years of education: Not on file  . Highest education level: Not on file  Occupational History  . Not on file  Tobacco Use  . Smoking status: Former Smoker    Packs/day: 1.00    Years: 50.00    Pack years: 50.00    Types: Cigarettes    Quit date: 12/13/2017    Years since quitting: 2.0  . Smokeless tobacco: Never Used  Substance and Sexual Activity  . Alcohol use: No    Comment: quit alcohol 80's  . Drug use: No  . Sexual activity: Not on file  Other Topics Concern  . Not on file  Social History Narrative  . Not on file   Social Determinants of Health   Financial Resource Strain:   . Difficulty of Paying Living Expenses:   Food Insecurity:   . Worried About RCharity fundraiserin the Last Year:   . RArboriculturistin the Last Year:   Transportation Needs:   . LFilm/video editor(Medical):   .Marland KitchenLack of Transportation (Non-Medical):   Physical Activity:   . Days of Exercise per Week:   . Minutes of Exercise per Session:   Stress:   . Feeling of Stress :   Social Connections:   . Frequency of Communication with Friends and Family:   . Frequency of Social Gatherings with Friends and Family:   . Attends Religious Services:   . Active Member of Clubs or Organizations:   . Attends CArchivistMeetings:   .Marland KitchenMarital Status:     Family History  Problem Relation Age of Onset  . Lung cancer Mother   . Bladder Cancer Father     Outpatient Encounter Medications as of 01/14/2020  Medication Sig  . metFORMIN (GLUCOPHAGE) 1000 MG tablet Take 500 mg by mouth daily after breakfast.  . albuterol (PROVENTIL) (2.5 MG/3ML) 0.083% nebulizer solution Inhale 2.5 mg into the lungs every 6 (six) hours as needed for shortness of breath or wheezing.  .Marland KitchenamLODipine (NORVASC) 5 MG tablet Take 5  mg by mouth daily.  .Marland Kitchenatenolol-chlorthalidone (TENORETIC) 50-25 MG tablet Take 1 tablet by mouth daily with breakfast.   . blood glucose meter kit and supplies KIT Dispense based on patient and insurance preference. Use up to four times daily as directed. (FOR ICD-9 250.00, 250.01).  .Marland Kitchendiclofenac (VOLTAREN) 75 MG EC  tablet Take 75 mg by mouth 2 (two) times daily.  Marland Kitchen doxazosin (CARDURA) 8 MG tablet Take 8 mg by mouth daily with breakfast.   . famotidine (PEPCID) 20 MG tablet Take 20 mg by mouth 2 (two) times daily.  . furosemide (LASIX) 20 MG tablet Take 20 mg by mouth daily after breakfast.   . gabapentin (NEURONTIN) 800 MG tablet Take 800 mg by mouth every 6 (six) hours.   . methocarbamol (ROBAXIN) 500 MG tablet Take 1 tablet (500 mg total) by mouth every 6 (six) hours as needed for muscle spasms. (Patient not taking: Reported on 09/08/2019)  . Nutritional Supplements (JUICE PLUS FIBRE PO) Take 4 capsules by mouth daily after breakfast.   . oxyCODONE (OXY IR/ROXICODONE) 5 MG immediate release tablet Take 1-2 tablets (5-10 mg total) by mouth every 6 (six) hours as needed for severe pain. (Patient not taking: Reported on 09/08/2019)  . oxyCODONE-acetaminophen (PERCOCET) 10-325 MG tablet Take 1 tablet by mouth every 6 (six) hours.  . polyethylene glycol (MIRALAX / GLYCOLAX) packet Take 17 g by mouth daily as needed for mild constipation. (Patient taking differently: Take 17 g by mouth daily after breakfast. )  . potassium chloride SA (K-DUR,KLOR-CON) 20 MEQ tablet Take 40 mEq by mouth daily after breakfast.   . rosuvastatin (CRESTOR) 5 MG tablet Take 5 mg by mouth every Monday.   . traMADol (ULTRAM) 50 MG tablet Take 1-2 tablets (50-100 mg total) by mouth every 6 (six) hours as needed for moderate pain. (Patient not taking: Reported on 09/08/2019)  . traZODone (DESYREL) 100 MG tablet Take 100 mg by mouth at bedtime.   . [DISCONTINUED] metFORMIN (GLUCOPHAGE) 500 MG tablet Take 1 tablet (500 mg total)  by mouth 2 (two) times daily with a meal. (Patient taking differently: Take 1,000 mg by mouth 2 (two) times daily with a meal. )   No facility-administered encounter medications on file as of 01/14/2020.    ALLERGIES: Allergies  Allergen Reactions  . Adenosine     can't tolerate  05/2005  . Bupropion     Nausea  . Morphine And Related Itching    VACCINATION STATUS:  There is no immunization history on file for this patient.  Diabetes He presents for his initial diabetic visit. He has type 2 diabetes mellitus. Onset time: She was diagnosed at approximate age of 51 years. His disease course has been stable. There are no hypoglycemic associated symptoms. Pertinent negatives for hypoglycemia include no confusion, headaches, pallor or seizures. There are no diabetic associated symptoms. Pertinent negatives for diabetes include no chest pain, no fatigue, no polydipsia, no polyphagia, no polyuria and no weakness. There are no hypoglycemic complications. Symptoms are stable. There are no diabetic complications. Risk factors for coronary artery disease include diabetes mellitus, dyslipidemia, hypertension, male sex and sedentary lifestyle. Current diabetic treatments: Is currently on Metformin 1000 mg p.o. twice daily.  He wants to come off of metformin altogether. His weight is increasing steadily. He is following a generally unhealthy diet. When asked about meal planning, he reported none. He has not had a previous visit with a dietitian. He participates in exercise intermittently. His home blood glucose trend is decreasing steadily. (He is point-of-care A1c 6.6%, was 6.1% 3 months ago.  He presents with a log showing controlled glycemic profile between 140 and 176 at fasting.) An ACE inhibitor/angiotensin II receptor blocker is being taken.  Hyperlipidemia This is a chronic problem. The current episode started more than 1 year ago. Exacerbating diseases  include diabetes. Pertinent negatives include no  chest pain, myalgias or shortness of breath. Risk factors for coronary artery disease include diabetes mellitus, dyslipidemia, male sex, hypertension and a sedentary lifestyle.  Hypertension This is a chronic problem. The current episode started more than 1 year ago. The problem is controlled. Pertinent negatives include no chest pain, headaches, neck pain, palpitations or shortness of breath. Risk factors for coronary artery disease include diabetes mellitus, dyslipidemia, sedentary lifestyle and smoking/tobacco exposure. Past treatments include beta blockers and diuretics. Hypertensive end-organ damage includes heart failure.     Review of Systems  Constitutional: Negative for chills, fatigue, fever and unexpected weight change.  HENT: Negative for dental problem, mouth sores and trouble swallowing.   Eyes: Negative for visual disturbance.  Respiratory: Negative for cough, choking, chest tightness, shortness of breath and wheezing.   Cardiovascular: Negative for chest pain, palpitations and leg swelling.  Gastrointestinal: Negative for abdominal distention, abdominal pain, constipation, diarrhea, nausea and vomiting.  Endocrine: Negative for polydipsia, polyphagia and polyuria.  Genitourinary: Negative for dysuria, flank pain, hematuria and urgency.  Musculoskeletal: Positive for gait problem. Negative for back pain, myalgias and neck pain.       He walks with a cane due to back problem giving him disequilibrium.  Skin: Negative for pallor, rash and wound.  Neurological: Negative for seizures, syncope, weakness, numbness and headaches.  Psychiatric/Behavioral: Negative for confusion and dysphoric mood.    Objective:    Vitals with BMI 01/14/2020 09/08/2019 09/08/2019  Height _0  - -  Weight 189 lbs - -  BMI 22.63 - -  Systolic 335 456 256  Diastolic 63 99 91  Pulse 79 75 73    BP 100/63   Pulse 79   Ht _1  (1.727 m)   Wt 189 lb (85.7 kg)   BMI 28.74 kg/m   Wt Readings from  Last 3 Encounters:  01/14/20 189 lb (85.7 kg)  09/08/19 185 lb (83.9 kg)  03/06/19 172 lb (78 kg)     Physical Exam Constitutional:      General: He is not in acute distress.    Appearance: He is well-developed.  HENT:     Head: Normocephalic and atraumatic.  Neck:     Thyroid: No thyromegaly.     Trachea: No tracheal deviation.  Cardiovascular:     Rate and Rhythm: Normal rate.     Pulses:          Dorsalis pedis pulses are 1+ on the right side and 1+ on the left side.       Posterior tibial pulses are 1+ on the right side and 1+ on the left side.     Heart sounds: S1 normal and S2 normal. No murmur. No gallop.   Pulmonary:     Effort: Pulmonary effort is normal. No respiratory distress.     Breath sounds: No wheezing.  Abdominal:     General: There is no distension.     Tenderness: There is no abdominal tenderness. There is no guarding.  Musculoskeletal:     Right shoulder: No swelling or deformity.     Cervical back: Normal range of motion and neck supple.  Skin:    General: Skin is warm and dry.     Findings: No rash.     Nails: There is no clubbing.  Neurological:     Mental Status: He is alert and oriented to person, place, and time.     Cranial Nerves: No cranial nerve deficit.  Sensory: No sensory deficit.     Gait: Gait normal.     Deep Tendon Reflexes: Reflexes are normal and symmetric.  Psychiatric:        Speech: Speech normal.        Behavior: Behavior normal. Behavior is cooperative.        Thought Content: Thought content normal.        Judgment: Judgment normal.       CMP ( most recent) CMP     Component Value Date/Time   NA 139 09/08/2019 2018   K 3.6 09/08/2019 2018   CL 102 09/08/2019 2018   CO2 26 09/08/2019 2018   GLUCOSE 161 (H) 09/08/2019 2018   BUN 35 (H) 09/08/2019 2018   CREATININE 1.07 09/08/2019 2018   CALCIUM 9.3 09/08/2019 2018   PROT 6.9 09/08/2019 2018   ALBUMIN 4.1 09/08/2019 2018   AST 18 09/08/2019 2018   ALT 26  09/08/2019 2018   ALKPHOS 78 09/08/2019 2018   BILITOT 0.3 09/08/2019 2018   GFRNONAA >60 09/08/2019 2018   GFRAA >60 09/08/2019 2018     Diabetic Labs (most recent): Lab Results  Component Value Date   HGBA1C 6.6 (A) 01/14/2020   HGBA1C 6.1 10/17/2019   HGBA1C 6.2 (H) 03/01/2019     Lipid Panel ( most recent) Lipid Panel     Component Value Date/Time   TRIG 259 (H) 12/19/2017 0442    Assessment & Plan:   1. Type 2 diabetes mellitus without complication, without long-term current use of insulin (HCC)  - RIGGINS CISEK has currently uncontrolled symptomatic type 2 DM since  69 years of age,  with most recent A1c of 6.6 %. Recent labs reviewed. - I had a long discussion with him about the progressive nature of diabetes and the pathology behind its complications. -his diabetes is complicated by diastolic dysfunction and he remains at a high risk for more acute and chronic complications which include CAD, CVA, CKD, retinopathy, and neuropathy. These are all discussed in detail with him.  - I have counseled him on diet  and weight management  by adopting a carbohydrate restricted/protein rich diet. Patient is encouraged to switch to  unprocessed or minimally processed     complex starch and increased protein intake (animal or plant source), fruits, and vegetables. -  he is advised to stick to a routine mealtimes to eat 3 meals  a day and avoid unnecessary snacks ( to snack only to correct hypoglycemia).   - he admits that there is a room for improvement in his food and drink choices. - Suggestion is made for him to avoid simple carbohydrates  from his diet including Cakes, Sweet Desserts, Ice Cream, Soda (diet and regular), Sweet Tea, Candies, Chips, Cookies, Store Bought Juices, Alcohol in Excess of  1-2 drinks a day, Artificial Sweeteners,  Coffee Creamer, and "Sugar-free" Products. This will help patient to have more stable blood glucose profile and potentially avoid unintended  weight gain.  - he will be scheduled with Jearld Fenton, RDN, CDE for diabetes education.  - I have approached him with the following individualized plan to manage  his diabetes and patient agrees:   -Patient wishes to come off of Metformin.  I discussed with him that it is important to keep at least low-dose Metformin on board to control diabetes to target.  He agrees and reluctantly accepts Metformin 500 mg p.o. daily after breakfast, with plan to repeat A1c in 3 to 4 months. -He  has several choices for treating diabetes if Metformin is not enough.  He is not a candidate for SGLT2 inhibitors due to his prior history of Fournier's gangrene. -Given his hypertriglyceridemia, a low-dose basal insulin will be the best choice if it is necessary to switch.   - Specific targets for  A1c;  LDL, HDL,  and Triglycerides were discussed with the patient.  2) Blood Pressure /Hypertension:  his blood pressure is  controlled to target.   he is advised to continue his current medications including atenolol/HCTZ mg p.o. daily with breakfast . 3) Lipids/Hyperlipidemia:   Review of his recent lipid panel showed  controlled triglycerides 300+ .  he  is advised to continue Crestor 5 mg daily at bedtime.  Side effects and precautions discussed with him.  4)  Weight/Diet:  Body mass index is 28.74 kg/m.  -He is not a candidate for major weight loss.  I discussed with him the fact that loss of 5 - 10% of his  current body weight will have the most impact on his diabetes management.  Exercise, and detailed carbohydrates information provided  -  detailed on discharge instructions.  5) Chronic Care/Health Maintenance:  -he  is on Statin medications and  is encouraged to initiate and continue to follow up with Ophthalmology, Dentist,  Podiatrist at least yearly or according to recommendations, and advised to   stay away from smoking. I have recommended yearly flu vaccine and pneumonia vaccine at least every 5 years;  moderate intensity exercise for up to 150 minutes weekly; and  sleep for at least 7 hours a day.  - he is  advised to maintain close follow up with Monico Blitz, MD for primary care needs, as well as his other providers for optimal and coordinated care.   - Time spent in this patient care: 60 min, of which > 50% was spent in  counseling  him about his currently controlled type 2 diabetes, hyperlipidemia and the rest reviewing his blood glucose logs , discussing his hypoglycemia and hyperglycemia episodes, reviewing his current and  previous labs / studies  ( including abstraction from other facilities) and medications  doses and developing a  long term treatment plan based on the latest standards of care/ guidelines; and documenting his care.    Please refer to Patient Instructions for Blood Glucose Monitoring and Insulin/Medications Dosing Guide"  in media tab for additional information. Please  also refer to " Patient Self Inventory" in the Media  tab for reviewed elements of pertinent patient history.  Patrick Aguilar participated in the discussions, expressed understanding, and voiced agreement with the above plans.  All questions were answered to his satisfaction. he is encouraged to contact clinic should he have any questions or concerns prior to his return visit.   Follow up plan: - Return in about 4 months (around 05/16/2020) for Follow up with Pre-visit Labs, Next Visit A1c in Office.  Glade Lloyd, MD [facility] [address] [place], [place] [address] Phone: [phone]  Fax: [phone]    01/14/2020, 4:06 PM  This note was partially dictated with voice recognition software. Similar sounding words can be transcribed inadequately or may not  be corrected upon review.

## 2020-01-14 NOTE — Patient Instructions (Signed)

## 2020-01-20 DIAGNOSIS — I2699 Other pulmonary embolism without acute cor pulmonale: Secondary | ICD-10-CM | POA: Diagnosis not present

## 2020-01-20 DIAGNOSIS — I10 Essential (primary) hypertension: Secondary | ICD-10-CM | POA: Diagnosis not present

## 2020-01-20 DIAGNOSIS — Z299 Encounter for prophylactic measures, unspecified: Secondary | ICD-10-CM | POA: Diagnosis not present

## 2020-01-20 DIAGNOSIS — E1165 Type 2 diabetes mellitus with hyperglycemia: Secondary | ICD-10-CM | POA: Diagnosis not present

## 2020-01-20 DIAGNOSIS — E1142 Type 2 diabetes mellitus with diabetic polyneuropathy: Secondary | ICD-10-CM | POA: Diagnosis not present

## 2020-01-20 DIAGNOSIS — J449 Chronic obstructive pulmonary disease, unspecified: Secondary | ICD-10-CM | POA: Diagnosis not present

## 2020-02-04 DIAGNOSIS — M545 Low back pain: Secondary | ICD-10-CM | POA: Diagnosis not present

## 2020-02-04 DIAGNOSIS — U071 COVID-19: Secondary | ICD-10-CM | POA: Diagnosis not present

## 2020-02-04 DIAGNOSIS — M47816 Spondylosis without myelopathy or radiculopathy, lumbar region: Secondary | ICD-10-CM | POA: Diagnosis not present

## 2020-02-04 DIAGNOSIS — G894 Chronic pain syndrome: Secondary | ICD-10-CM | POA: Diagnosis not present

## 2020-02-04 DIAGNOSIS — R0902 Hypoxemia: Secondary | ICD-10-CM | POA: Diagnosis not present

## 2020-02-09 DIAGNOSIS — J449 Chronic obstructive pulmonary disease, unspecified: Secondary | ICD-10-CM | POA: Diagnosis not present

## 2020-02-09 DIAGNOSIS — E119 Type 2 diabetes mellitus without complications: Secondary | ICD-10-CM | POA: Diagnosis not present

## 2020-03-06 DIAGNOSIS — U071 COVID-19: Secondary | ICD-10-CM | POA: Diagnosis not present

## 2020-03-06 DIAGNOSIS — R0902 Hypoxemia: Secondary | ICD-10-CM | POA: Diagnosis not present

## 2020-03-11 DIAGNOSIS — E119 Type 2 diabetes mellitus without complications: Secondary | ICD-10-CM | POA: Diagnosis not present

## 2020-03-11 DIAGNOSIS — J449 Chronic obstructive pulmonary disease, unspecified: Secondary | ICD-10-CM | POA: Diagnosis not present

## 2020-03-25 ENCOUNTER — Encounter: Payer: Medicare Other | Attending: "Endocrinology | Admitting: Nutrition

## 2020-03-25 ENCOUNTER — Other Ambulatory Visit: Payer: Self-pay

## 2020-03-25 ENCOUNTER — Encounter: Payer: Self-pay | Admitting: Nutrition

## 2020-03-25 DIAGNOSIS — E119 Type 2 diabetes mellitus without complications: Secondary | ICD-10-CM | POA: Diagnosis not present

## 2020-03-25 DIAGNOSIS — Z713 Dietary counseling and surveillance: Secondary | ICD-10-CM | POA: Diagnosis not present

## 2020-03-25 NOTE — Patient Instructions (Addendum)
Goals  Eat balanced meals Drink water Try chair exercises. Eat 30-45 g CHO at meals. Increase Metformin 1000 mg BID. Call DR. Nida If BS are > 300's on regular basis. Check with PCP about referral to pulmonologist due to COPD.

## 2020-03-25 NOTE — Progress Notes (Signed)
  Medical Nutrition Therapy:  Appt start time: 1400 end time:  1530  Assessment:  Primary concerns today: Diabetes Type 2. PMH: HTN, COPD, Fourneir gangrene, Hyperlipidemia, back problems, OA of right hip. Here with Jay,a close friend. Sees Dr. Fransico Him.  His friend his helping with his meals. Sees Dr. Sherryll Burger for his PCP. Has long standing COPD and would like to see pulmonologist. History of blood clots in his lungs. Had Covid in Jan 2021. A1C 6.6%. He has to use his inhaler and albuterol often due to being SOB with the heat. BS have spiked since increasing use of breathing medications.  He walks with a cane. Has had multiple surgeries and can't move well. 500 mg Metformin  Once a day. FBS:200's  Mostly since having to use his breathing medicine more often. Was on Metformin 1000 mg BID in the past and was reduced to 500 mg once a day when BS were better. Eats 2-3 meals per day. Eating fairly balanced meals. Avoids most processed and fried foods. Working on eating more fruit and vegetables. Drinks  Mostly water Willing to work on being more consistent with food choices to improve BS and work with endocrinologist with medications to improve BS.    Preferred Learning Style:   No preference indicated   Learning Readiness:   Ready  Change in progress   MEDICATIONS:    DIETARY INTAKE:  24-hr recall:  B ( AM): Fried bologna , blueberries, tomatoes, scrambled eggs, water Snk ( AM): coffee L ( PM): ham slices, water Snk ( PM): zero gatorade D ( PM):beef liver and onions, pinto beans, beets, water Snk ( PM):  Beverages: water, zero gatorad  Usual physical activity: ADL COPD and can't do much.  Estimated energy needs: 1600  calories 180 g carbohydrates 120 g protein 44 g fat  Progress Towards Goal(s):  In progress.   Nutritional Diagnosis:  NB-1.1 Food and nutrition-related knowledge deficit As related to Diabetes Type 2.  As evidenced by A1C 6.6%.    Intervention:  Nutrition and  Diabetes education provided on My Plate, CHO counting, meal planning, portion sizes, timing of meals, avoiding snacks between meals unless having a low blood sugar, target ranges for A1C and blood sugars, signs/symptoms and treatment of hyper/hypoglycemia, monitoring blood sugars, taking medications as prescribed, benefits of exercising 30 minutes per day and prevention of complications of DM. Goals  Eat balanced meals Drink water Try chair exercises. Eat 30-45 g CHO at meals. Increase Metformin 1000 mg BID. Call DR. Nida If BS are > 300's on regular basis. Check with PCP about referral to pulmonologist due to COPD.   Teaching Method Utilized:  Visual Auditory Hands on  Handouts given during visit include:  The Plate Method   Meal Plan Card  Diabetes Instructions.   Barriers to learning/adherence to lifestyle change:COPD-can't exercise much. Demonstrated degree of understanding via:  Teach Back   Monitoring/Evaluation:  Dietary intake, exercise,, and body weight in 6 week(s).

## 2020-04-05 DIAGNOSIS — U071 COVID-19: Secondary | ICD-10-CM | POA: Diagnosis not present

## 2020-04-05 DIAGNOSIS — R0902 Hypoxemia: Secondary | ICD-10-CM | POA: Diagnosis not present

## 2020-04-07 ENCOUNTER — Telehealth: Payer: Self-pay | Admitting: Nutrition

## 2020-04-07 NOTE — Telephone Encounter (Signed)
TC to follow up with patient regarding blood sugars. He notes his BS are running in the 140's in the am. Eating more vegetables and berries. He notes he forgets to check BS in am a few times per week. Advised to check BS in am before  Breakfast and goal blood sugar in < 130's. He verbalized understanding.

## 2020-04-10 DIAGNOSIS — J449 Chronic obstructive pulmonary disease, unspecified: Secondary | ICD-10-CM | POA: Diagnosis not present

## 2020-04-10 DIAGNOSIS — E119 Type 2 diabetes mellitus without complications: Secondary | ICD-10-CM | POA: Diagnosis not present

## 2020-04-13 DIAGNOSIS — J449 Chronic obstructive pulmonary disease, unspecified: Secondary | ICD-10-CM | POA: Diagnosis not present

## 2020-04-13 DIAGNOSIS — E119 Type 2 diabetes mellitus without complications: Secondary | ICD-10-CM | POA: Diagnosis not present

## 2020-04-21 DIAGNOSIS — I10 Essential (primary) hypertension: Secondary | ICD-10-CM | POA: Diagnosis not present

## 2020-04-21 DIAGNOSIS — J449 Chronic obstructive pulmonary disease, unspecified: Secondary | ICD-10-CM | POA: Diagnosis not present

## 2020-04-21 DIAGNOSIS — E1165 Type 2 diabetes mellitus with hyperglycemia: Secondary | ICD-10-CM | POA: Diagnosis not present

## 2020-04-21 DIAGNOSIS — E1142 Type 2 diabetes mellitus with diabetic polyneuropathy: Secondary | ICD-10-CM | POA: Diagnosis not present

## 2020-04-21 DIAGNOSIS — Z299 Encounter for prophylactic measures, unspecified: Secondary | ICD-10-CM | POA: Diagnosis not present

## 2020-04-21 DIAGNOSIS — J9611 Chronic respiratory failure with hypoxia: Secondary | ICD-10-CM | POA: Diagnosis not present

## 2020-04-21 LAB — HEMOGLOBIN A1C: Hemoglobin A1C: 6

## 2020-05-06 DIAGNOSIS — R0902 Hypoxemia: Secondary | ICD-10-CM | POA: Diagnosis not present

## 2020-05-11 DIAGNOSIS — M5136 Other intervertebral disc degeneration, lumbar region: Secondary | ICD-10-CM | POA: Diagnosis not present

## 2020-05-11 DIAGNOSIS — G894 Chronic pain syndrome: Secondary | ICD-10-CM | POA: Diagnosis not present

## 2020-05-11 DIAGNOSIS — M47816 Spondylosis without myelopathy or radiculopathy, lumbar region: Secondary | ICD-10-CM | POA: Diagnosis not present

## 2020-05-11 DIAGNOSIS — Z79891 Long term (current) use of opiate analgesic: Secondary | ICD-10-CM | POA: Diagnosis not present

## 2020-05-11 DIAGNOSIS — M545 Low back pain: Secondary | ICD-10-CM | POA: Diagnosis not present

## 2020-05-13 DIAGNOSIS — E119 Type 2 diabetes mellitus without complications: Secondary | ICD-10-CM | POA: Diagnosis not present

## 2020-05-14 LAB — COMPLETE METABOLIC PANEL WITH GFR
AG Ratio: 1.9 (calc) (ref 1.0–2.5)
ALT: 24 U/L (ref 9–46)
AST: 16 U/L (ref 10–35)
Albumin: 4.1 g/dL (ref 3.6–5.1)
Alkaline phosphatase (APISO): 82 U/L (ref 35–144)
BUN: 25 mg/dL (ref 7–25)
CO2: 28 mmol/L (ref 20–32)
Calcium: 9.4 mg/dL (ref 8.6–10.3)
Chloride: 101 mmol/L (ref 98–110)
Creat: 1.13 mg/dL (ref 0.70–1.25)
GFR, Est African American: 76 mL/min/{1.73_m2} (ref >=60)
GFR, Est Non African American: 66 mL/min/{1.73_m2} (ref >=60)
Globulin: 2.2 g/dL (calc) (ref 1.9–3.7)
Glucose, Bld: 150 mg/dL — ABNORMAL HIGH (ref 65–99)
Potassium: 4.5 mmol/L (ref 3.5–5.3)
Sodium: 140 mmol/L (ref 135–146)
Total Bilirubin: 0.4 mg/dL (ref 0.2–1.2)
Total Protein: 6.3 g/dL (ref 6.1–8.1)

## 2020-05-14 LAB — MICROALBUMIN / CREATININE URINE RATIO
Creatinine, Urine: 48 mg/dL (ref 20–320)
Microalb, Ur: <0.2 mg/dL

## 2020-05-19 ENCOUNTER — Ambulatory Visit: Payer: Medicare Other | Admitting: "Endocrinology

## 2020-05-19 ENCOUNTER — Other Ambulatory Visit: Payer: Self-pay

## 2020-05-19 ENCOUNTER — Encounter: Payer: Self-pay | Admitting: Nutrition

## 2020-05-19 ENCOUNTER — Encounter: Payer: Medicare Other | Attending: "Endocrinology | Admitting: Nutrition

## 2020-05-19 ENCOUNTER — Encounter: Payer: Self-pay | Admitting: "Endocrinology

## 2020-05-19 VITALS — Ht 68.0 in | Wt 186.0 lb

## 2020-05-19 VITALS — BP 95/62 | HR 72 | Ht 68.0 in | Wt 186.6 lb

## 2020-05-19 DIAGNOSIS — E782 Mixed hyperlipidemia: Secondary | ICD-10-CM | POA: Diagnosis not present

## 2020-05-19 DIAGNOSIS — E119 Type 2 diabetes mellitus without complications: Secondary | ICD-10-CM | POA: Diagnosis not present

## 2020-05-19 DIAGNOSIS — I10 Essential (primary) hypertension: Secondary | ICD-10-CM

## 2020-05-19 NOTE — Progress Notes (Signed)
  Medical Nutrition Therapy:  Appt start time: 1015 end time: 1030  Assessment:  Primary concerns today: Diabetes Type 2. PMH: HTN, COPD, Fourneir gangrene, Hyperlipidemia, back problems, OA of right hip..  Saw Dr. Fransico Him today. A1C 6%, down from 6.6% . Going to reduce his Metformin back to 500 mg BID. Changed: now is eating breakfast and eat three times per day.  Lab Results  Component Value Date   HGBA1C 6.0 04/21/2020   CMP Latest Ref Rng & Units 05/13/2020 09/08/2019 03/08/2019  Glucose 65 - 99 mg/dL 742(V) 956(L) 875(I)  BUN 7 - 25 mg/dL 25 43(P) 22  Creatinine 0.70 - 1.25 mg/dL 2.95 1.88 4.16  Sodium 135 - 146 mmol/L 140 139 137  Potassium 3.5 - 5.3 mmol/L 4.5 3.6 3.2(L)  Chloride 98 - 110 mmol/L 101 102 97(L)  CO2 20 - 32 mmol/L 28 26 26   Calcium 8.6 - 10.3 mg/dL 9.4 9.3 )  Total Protein 6.1 - 8.1 g/dL 6.3 6.9 -  Total Bilirubin 0.2 - 1.2 mg/dL 0.4 0.3 -  Alkaline Phos 38 - 126 U/L - 78 -  AST 10 - 35 U/L 16 18 -  ALT 9 - 46 U/L 24 26 -   Lipid Panel     Component Value Date/Time   TRIG 259 (H) 12/19/2017 0442     Preferred Learning Style:   No preference indicated   Learning Readiness:   Ready  Change in progress   MEDICATIONS:    DIETARY INTAKE:  24-hr recall:  B ( AM): egg, oatmeal and blueberies,  Snk ( AM): coffee L ( PM):banana sandwich, water,  Snk ( PM): zero gatorade D ( PM):PInto beans, snaps, potatoes, fried liver pudding. Snk ( PM):  Beverages: water,  Usual physical activity: ADL COPD and can't do much.  Estimated energy needs: 1600  calories 180 g carbohydrates 120 g protein 44 g fat  Progress Towards Goal(s):  In progress.   Nutritional Diagnosis:  NB-1.1 Food and nutrition-related knowledge deficit As related to Diabetes Type 2.  As evidenced by A1C 6.6%.    Intervention:  Nutrition and Diabetes education provided on My Plate, CHO counting, meal planning, portion sizes, timing of meals, avoiding snacks between meals unless  having a low blood sugar, target ranges for A1C and blood sugars, signs/symptoms and treatment of hyper/hypoglycemia, monitoring blood sugars, taking medications as prescribed, benefits of exercising 30 minutes per day and prevention of complications of DM.  Goals  Keep up the great job. Eat three meals per day Keep drinking water  Keep walking as tolerated Try breathing exercises   Teaching Method Utilized:  Visual Auditory Hands on  Handouts given during visit include:  The Plate Method   Meal Plan Card  Diabetes Instructions.   Barriers to learning/adherence to lifestyle change:COPD-can't exercise much. Demonstrated degree of understanding via:  Teach Back   Monitoring/Evaluation:  Dietary intake, exercise,, and body weight in 6 week(s).

## 2020-05-19 NOTE — Patient Instructions (Signed)

## 2020-05-19 NOTE — Progress Notes (Signed)
05/19/2020, 1:10 PM  Endocrinology follow-up note  Subjective:    Patient ID: Patrick Aguilar, male    DOB: 1950-12-09.  Patrick Aguilar is being seen in follow-up after he was seen in consultation for management of currently controlled asymptomatic diabetes requested by  Monico Blitz, MD.   Past Medical History:  Diagnosis Date  . AKI (acute kidney injury) ([place]) 12/2017  . Anxiety   . Arthritis   . Complication of anesthesia    low respirations, low BP  . COPD (chronic obstructive pulmonary disease) ([place])   . DDD (degenerative disc disease), lumbar   . Diabetes mellitus without complication ([place])    type 2  . Diastolic dysfunction 73/56/7014   Moderate noted on ECHO  . Fournier's gangrene in male   . GERD (gastroesophageal reflux disease)   . History of ARDS   . History of necrotizing fasciitis    Severe  . Hypertension   . Lumbar spondylosis   . Numbness and tingling of both upper extremities   . Pulmonary hypertension ([place]) 12/14/2017   Mild, noted on ECHO  . Respiratory failure, acute ([place]) 12/2017  . Septic shock ([place]) 12/2017  . Shortness of breath dyspnea   . Testicular pain, left   . Wears glasses     Past Surgical History:  Procedure Laterality Date  . APPENDECTOMY    . APPLICATION OF A-CELL OF CHEST/ABDOMEN N/A 01/08/2018   Procedure: APPLICATION OF A-CELL OF GROIN;  Surgeon: Wallace Going, DO;  Location: [facility];  Service: Plastics;  Laterality: N/A;  . APPLICATION OF A-CELL OF EXTREMITY N/A 12/25/2017   Procedure: APPLICATION OF A-CELL;  Surgeon: Wallace Going, DO;  Location: [facility];  Service: Plastics;  Laterality: N/A;  . BACK SURGERY     x5  . CARDIAC CATHETERIZATION  06   neg  . CERVICAL DISC SURGERY     x2  . COLONOSCOPY    . DEBRIDEMENT AND CLOSURE WOUND N/A 01/26/2018   Procedure: REVISION OF PERINEUM WOUND WITH DEBRIDEMENT, PARTIAL CLOSURE OF PERINEUM, PLACEMENT OF [place];  Surgeon: Wallace Going, DO;  Location: [facility];  Service: Plastics;  Laterality: N/A;  . DENTAL SURGERY     teeth extractions  . groin wound  01/08/2018   : EXCISION OF GROIN WOUND WITH PLACEMENT OF ACELL, AND PRIMARY WOUND CLOSURE (N/A Scrotum)  . I & D EXTREMITY N/A 03/12/2018   Procedure: IRRIGATION AND DEBRIDEMENT PERIMUM WOUND WITH CLOSURE;  Surgeon: Wallace Going, DO;  Location: [facility];  Service: Plastics;  Laterality: N/A;  . INCISION AND DRAINAGE ABSCESS Left 07/11/2018   Procedure: INCISION AND DRAINAGE ABSCESS LEFT THIGH;  Surgeon: Franchot Gallo, MD;  Location: [facility];  Service: Urology;  Laterality: Left;  . INCISION AND DRAINAGE OF WOUND N/A 12/25/2017   Procedure: Irrigation and debridement of Fournier's of scrotum with placement of testes in subcutaneous thigh pockets and Acell placement;  Surgeon: Wallace Going, DO;  Location: [facility];  Service: Plastics;  Laterality: N/A;  . INCISION AND DRAINAGE OF WOUND N/A 01/08/2018   Procedure: EXCISION OF GROIN WOUND WITH PLACEMENT OF ACELL, AND PRIMARY WOUND CLOSURE;  Surgeon: Wallace Going, DO;  Location: [facility];  Service: Plastics;  Laterality: N/A;  . ORCHIECTOMY N/A 12/13/2017   Procedure: EXCISION OF SCROTUM AND DEBRIDEMENT OF PENIS;  Surgeon: Franchot Gallo, MD;  Location[facility];  Service: Urology;  Laterality: N/A;  . ORCHIECTOMY Left 06/22/2018  Procedure: ORCHIECTOMY;  Surgeon: Franchot Gallo, MD;  Location: [facility];  Service: Urology;  Laterality: Left;  . PLANTAR FASCIA SURGERY Bilateral   . shoulders Bilateral    rotator cuff  . SUBMANDIBULAR GLAND EXCISION Left 03/12/2015   Procedure: LEFT SUBMANDIBULAR GLAND RESECTION;  Surgeon: Melida Quitter, MD;  Location: [place] [facility];  Service: ENT;  Laterality: Left;  . TONSILLECTOMY    . TOTAL HIP ARTHROPLASTY Right 03/06/2019   Procedure: TOTAL HIP ARTHROPLASTY ANTERIOR APPROACH;  Surgeon: Gaynelle Arabian, MD;  Location: [facility];  Service: Orthopedics;   Laterality: Right;  128mn    Social History   Socioeconomic History  . Marital status: Single    Spouse name: Not on file  . Number of children: Not on file  . Years of education: Not on file  . Highest education level: Not on file  Occupational History  . Not on file  Tobacco Use  . Smoking status: Former Smoker    Packs/day: 1.00    Years: 50.00    Pack years: 50.00    Types: Cigarettes    Quit date: 12/13/2017    Years since quitting: 2.4  . Smokeless tobacco: Never Used  Vaping Use  . Vaping Use: Never used  Substance and Sexual Activity  . Alcohol use: No    Comment: quit alcohol 80's  . Drug use: No  . Sexual activity: Not on file  Other Topics Concern  . Not on file  Social History Narrative  . Not on file   Social Determinants of Health   Financial Resource Strain:   . Difficulty of Paying Living Expenses: Not on file  Food Insecurity:   . Worried About RCharity fundraiserin the Last Year: Not on file  . Ran Out of Food in the Last Year: Not on file  Transportation Needs:   . Lack of Transportation (Medical): Not on file  . Lack of Transportation (Non-Medical): Not on file  Physical Activity:   . Days of Exercise per Week: Not on file  . Minutes of Exercise per Session: Not on file  Stress:   . Feeling of Stress : Not on file  Social Connections:   . Frequency of Communication with Friends and Family: Not on file  . Frequency of Social Gatherings with Friends and Family: Not on file  . Attends Religious Services: Not on file  . Active Member of Clubs or Organizations: Not on file  . Attends CArchivistMeetings: Not on file  . Marital Status: Not on file    Family History  Problem Relation Age of Onset  . Lung cancer Mother   . Bladder Cancer Father     Outpatient Encounter Medications as of 05/19/2020  Medication Sig  . albuterol (PROVENTIL) (2.5 MG/3ML) 0.083% nebulizer solution Inhale 2.5 mg into the lungs every 6 (six) hours as  needed for shortness of breath or wheezing.  .Marland Kitchenatenolol-chlorthalidone (TENORETIC) 50-25 MG tablet Take 1 tablet by mouth daily with breakfast.   . blood glucose meter kit and supplies KIT Dispense based on patient and insurance preference. Use up to four times daily as directed. (FOR ICD-9 250.00, 250.01).  .Marland Kitchendiclofenac (VOLTAREN) 75 MG EC tablet Take 75 mg by mouth 2 (two) times daily.  .Marland Kitchendoxazosin (CARDURA) 8 MG tablet Take 8 mg by mouth daily with breakfast.   . famotidine (PEPCID) 20 MG tablet Take 20 mg by mouth 2 (two) times daily.  . furosemide (LASIX) 20 MG  tablet Take 20 mg by mouth daily after breakfast.   . gabapentin (NEURONTIN) 800 MG tablet Take 800 mg by mouth every 6 (six) hours.   . metFORMIN (GLUCOPHAGE) 1000 MG tablet Take 500 mg by mouth daily after breakfast.  . Nutritional Supplements (JUICE PLUS FIBRE PO) Take 4 capsules by mouth daily after breakfast.   . oxyCODONE-acetaminophen (PERCOCET) 10-325 MG tablet Take 1 tablet by mouth every 6 (six) hours.  . polyethylene glycol (MIRALAX / GLYCOLAX) packet Take 17 g by mouth daily as needed for mild constipation. (Patient taking differently: Take 17 g by mouth daily after breakfast. )  . potassium chloride SA (K-DUR,KLOR-CON) 20 MEQ tablet Take 40 mEq by mouth daily after breakfast.   . rosuvastatin (CRESTOR) 5 MG tablet Take 5 mg by mouth every Monday.   . traZODone (DESYREL) 100 MG tablet Take 100 mg by mouth at bedtime.   . [DISCONTINUED] amLODipine (NORVASC) 5 MG tablet Take 5 mg by mouth daily.  . [DISCONTINUED] methocarbamol (ROBAXIN) 500 MG tablet Take 1 tablet (500 mg total) by mouth every 6 (six) hours as needed for muscle spasms. (Patient not taking: Reported on 09/08/2019)  . [DISCONTINUED] oxyCODONE (OXY IR/ROXICODONE) 5 MG immediate release tablet Take 1-2 tablets (5-10 mg total) by mouth every 6 (six) hours as needed for severe pain. (Patient not taking: Reported on 09/08/2019)  . [DISCONTINUED] traMADol (ULTRAM) 50  MG tablet Take 1-2 tablets (50-100 mg total) by mouth every 6 (six) hours as needed for moderate pain. (Patient not taking: Reported on 09/08/2019)   No facility-administered encounter medications on file as of 05/19/2020.    ALLERGIES: Allergies  Allergen Reactions  . Adenosine     can't tolerate  05/2005  . Bupropion     Nausea  . Morphine And Related Itching    VACCINATION STATUS:  There is no immunization history on file for this patient.  Diabetes He presents for his follow-up diabetic visit. He has type 2 diabetes mellitus. Onset time: She was diagnosed at approximate age of 3 years. His disease course has been improving. There are no hypoglycemic associated symptoms. Pertinent negatives for hypoglycemia include no confusion, headaches, pallor or seizures. There are no diabetic associated symptoms. Pertinent negatives for diabetes include no chest pain, no fatigue, no polydipsia, no polyphagia, no polyuria and no weakness. There are no hypoglycemic complications. Symptoms are improving. There are no diabetic complications. Risk factors for coronary artery disease include diabetes mellitus, dyslipidemia, hypertension, male sex and sedentary lifestyle. Current diabetic treatments: Is currently on Metformin 1000 mg p.o. twice daily.  He wants to come off of metformin altogether. His weight is decreasing steadily. He is following a generally unhealthy diet. When asked about meal planning, he reported none. He has not had a previous visit with a dietitian. He participates in exercise intermittently. His home blood glucose trend is decreasing steadily. His breakfast blood glucose range is generally 140-180 mg/dl. His bedtime blood glucose range is generally 140-180 mg/dl. His overall blood glucose range is 140-180 mg/dl. (His previsit labs show A1c of 6%, improving from 6.6%.  His presentation and lateral stable glycemic profile between 145-152 at fasting.  Has no hypoglycemia.   ) An ACE  inhibitor/angiotensin II receptor blocker is being taken.  Hyperlipidemia This is a chronic problem. The current episode started more than 1 year ago. Exacerbating diseases include diabetes. Pertinent negatives include no chest pain, myalgias or shortness of breath. Risk factors for coronary artery disease include diabetes mellitus, dyslipidemia, male sex,  hypertension and a sedentary lifestyle.  Hypertension This is a chronic problem. The current episode started more than 1 year ago. The problem is controlled. Pertinent negatives include no chest pain, headaches, neck pain, palpitations or shortness of breath. Risk factors for coronary artery disease include diabetes mellitus, dyslipidemia, sedentary lifestyle and smoking/tobacco exposure. Past treatments include beta blockers and diuretics. Hypertensive end-organ damage includes heart failure.     Review of Systems  Constitutional: Negative for chills, fatigue, fever and unexpected weight change.  HENT: Negative for dental problem, mouth sores and trouble swallowing.   Eyes: Negative for visual disturbance.  Respiratory: Negative for cough, choking, chest tightness, shortness of breath and wheezing.   Cardiovascular: Negative for chest pain, palpitations and leg swelling.  Gastrointestinal: Negative for abdominal distention, abdominal pain, constipation, diarrhea, nausea and vomiting.  Endocrine: Negative for polydipsia, polyphagia and polyuria.  Genitourinary: Negative for dysuria, flank pain, hematuria and urgency.  Musculoskeletal: Positive for gait problem. Negative for back pain, myalgias and neck pain.       He walks with a cane due to back problem giving him disequilibrium.  Skin: Negative for pallor, rash and wound.  Neurological: Negative for seizures, syncope, weakness, numbness and headaches.  Psychiatric/Behavioral: Negative for confusion and dysphoric mood.    Objective:    Vitals with BMI 05/19/2020 05/19/2020 03/25/2020  Height  '5\' 8"'  '5\' 8"'  '5\' 8"'   Weight 186 lbs 186 lbs 10 oz 189 lbs  BMI 28.29 78.29 56.21  Systolic - 95 -  Diastolic - 62 -  Pulse - 72 -    BP 95/62   Pulse 72   Ht '5\' 8"'  (1.727 m)   Wt 186 lb 9.6 oz (84.6 kg)   BMI 28.37 kg/m   Wt Readings from Last 3 Encounters:  05/19/20 186 lb (84.4 kg)  05/19/20 186 lb 9.6 oz (84.6 kg)  03/25/20 189 lb (85.7 kg)     Physical Exam Constitutional:      General: He is not in acute distress.    Appearance: He is well-developed.  HENT:     Head: Normocephalic and atraumatic.  Neck:     Thyroid: No thyromegaly.     Trachea: No tracheal deviation.  Cardiovascular:     Rate and Rhythm: Normal rate.     Pulses:          Dorsalis pedis pulses are 1+ on the right side and 1+ on the left side.       Posterior tibial pulses are 1+ on the right side and 1+ on the left side.     Heart sounds: S1 normal and S2 normal. No murmur heard.  No gallop.   Pulmonary:     Effort: Pulmonary effort is normal. No respiratory distress.     Breath sounds: No wheezing.  Abdominal:     General: There is no distension.     Tenderness: There is no abdominal tenderness. There is no guarding.  Musculoskeletal:     Right shoulder: No swelling or deformity.     Cervical back: Normal range of motion and neck supple.  Skin:    General: Skin is warm and dry.     Findings: No rash.     Nails: There is no clubbing.  Neurological:     Mental Status: He is alert and oriented to person, place, and time.     Cranial Nerves: No cranial nerve deficit.     Sensory: No sensory deficit.     Gait: Gait normal.  Deep Tendon Reflexes: Reflexes are normal and symmetric.  Psychiatric:        Speech: Speech normal.        Behavior: Behavior normal. Behavior is cooperative.        Thought Content: Thought content normal.        Judgment: Judgment normal.     CMP ( most recent) CMP     Component Value Date/Time   NA 140 05/13/2020 0000   K 4.5 05/13/2020 0000   CL 101  05/13/2020 0000   CO2 28 05/13/2020 0000   GLUCOSE 150 (H) 05/13/2020 0000   BUN 25 05/13/2020 0000   CREATININE 1.13 05/13/2020 0000   CALCIUM 9.4 05/13/2020 0000   PROT 6.3 05/13/2020 0000   ALBUMIN 4.1 09/08/2019 2018   AST 16 05/13/2020 0000   ALT 24 05/13/2020 0000   ALKPHOS 78 09/08/2019 2018   BILITOT 0.4 05/13/2020 0000   GFRNONAA 66 05/13/2020 0000   GFRAA 76 05/13/2020 0000     Diabetic Labs (most recent): Lab Results  Component Value Date   HGBA1C 6.0 04/21/2020   HGBA1C 6.6 (A) 01/14/2020   HGBA1C 6.1 10/17/2019     Lipid Panel ( most recent) Lipid Panel     Component Value Date/Time   TRIG 259 (H) 12/19/2017 0442     Assessment & Plan:   1. Type 2 diabetes mellitus without complication, without long-term current use of insulin (HCC)  - Patrick Aguilar has currently controlled asymptomatic type 2 DM since  69 years of age.  His previsit labs show A1c of 6%, improving from 6.6%.  His presentation and lateral stable glycemic profile between 145-152 at fasting.  Has no hypoglycemia.  - I had a long discussion with him about the progressive nature of diabetes and the pathology behind its complications. -his diabetes is complicated by diastolic dysfunction and he remains at a high risk for more acute and chronic complications which include CAD, CVA, CKD, retinopathy, and neuropathy. These are all discussed in detail with him.  - I have counseled him on diet  and weight management  by adopting a carbohydrate restricted/protein rich diet. Patient is encouraged to switch to  unprocessed or minimally processed     complex starch and increased protein intake (animal or plant source), fruits, and vegetables. -  he is advised to stick to a routine mealtimes to eat 3 meals  a day and avoid unnecessary snacks ( to snack only to correct hypoglycemia).   - he  admits there is a room for improvement in his diet and drink choices. -  Suggestion is made for him to avoid  simple carbohydrates  from his diet including Cakes, Sweet Desserts / Pastries, Ice Cream, Soda (diet and regular), Sweet Tea, Candies, Chips, Cookies, Sweet Pastries,  Store Bought Juices, Alcohol in Excess of  1-2 drinks a day, Artificial Sweeteners, Coffee Creamer, and "Sugar-free" Products. This will help patient to have stable blood glucose profile and potentially avoid unintended weight gain.   - he will be scheduled with Jearld Fenton, RDN, CDE for diabetes education.  - I have approached him with the following individualized plan to manage  his diabetes and patient agrees:   -Patient wishes to come off of Metformin.  I discussed with him that it is important to keep at least low-dose Metformin on board to maintain control of diabetes to target.    He agrees and will continue on Metformin 500 mg p.o. daily only after breakfast.     -  He has several choices for treating diabetes if Metformin is not enough.  He is not a candidate for SGLT2 inhibitors due to his prior history of Fournier's gangrene. -Given his hypertriglyceridemia, a low-dose basal insulin will be the best choice if it is necessary to switch.   - Specific targets for  A1c;  LDL, HDL,  and Triglycerides were discussed with the patient.  2) Blood Pressure /Hypertension:  his blood pressure is controlled tightly.  He is advised to discontinue amlodipine.  He is advised to continue  atenolol/HCTZ mg p.o. daily with breakfast . 3) Lipids/Hyperlipidemia:   Review of his recent lipid panel showed improving hypertriglyceridemia at 259.    he  is advised to continue Crestor 5 mg daily at bedtime.  Side effects and precautions discussed with him.  4)  Weight/Diet:  Body mass index is 28.37 kg/m.  -He is not a candidate for major weight loss.  I discussed with him the fact that loss of 5 - 10% of his  current body weight will have the most impact on his diabetes management.  Exercise, and detailed carbohydrates information provided  -   detailed on discharge instructions.  5) Chronic Care/Health Maintenance:  -he  is on Statin medications and  is encouraged to initiate and continue to follow up with Ophthalmology, Dentist,  Podiatrist at least yearly or according to recommendations, and advised to   stay away from smoking. I have recommended yearly flu vaccine and pneumonia vaccine at least every 5 years; moderate intensity exercise for up to 150 minutes weekly; and  sleep for at least 7 hours a day.  - he is  advised to maintain close follow up with Monico Blitz, MD for primary care needs, as well as his other providers for optimal and coordinated care.   - Time spent on this patient care encounter:  35 min, of which > 50% was spent in  counseling and the rest reviewing his blood glucose logs , discussing his hypoglycemia and hyperglycemia episodes, reviewing his current and  previous labs / studies  ( including abstraction from other facilities) and medications  doses and developing a  long term treatment plan and documenting his care.   Please refer to Patient Instructions for Blood Glucose Monitoring and Insulin/Medications Dosing Guide"  in media tab for additional information. Please  also refer to " Patient Self Inventory" in the Media  tab for reviewed elements of pertinent patient history.  Cherylynn Ridges participated in the discussions, expressed understanding, and voiced agreement with the above plans.  All questions were answered to his satisfaction. he is encouraged to contact clinic should he have any questions or concerns prior to his return visit.    Follow up plan: - Return in about 6 months (around 11/16/2020) for Bring Meter and Logs- A1c in Office.  Glade Lloyd, MD [facility] [address] [place], [place] [address] Phone: [phone]  Fax: [phone]    05/19/2020, 1:10 PM  This note was partially dictated with voice recognition software. Similar  sounding words can be transcribed inadequately or may not  be corrected upon review.

## 2020-05-19 NOTE — Patient Instructions (Addendum)
Goals  Keep up the great job. Eat three meals per day Keep drinking water  Keep walking as tolerated Try breathing exercises 

## 2020-06-06 DIAGNOSIS — R0902 Hypoxemia: Secondary | ICD-10-CM | POA: Diagnosis not present

## 2020-07-06 DIAGNOSIS — R0902 Hypoxemia: Secondary | ICD-10-CM | POA: Diagnosis not present

## 2020-07-10 DIAGNOSIS — E119 Type 2 diabetes mellitus without complications: Secondary | ICD-10-CM | POA: Diagnosis not present

## 2020-07-10 DIAGNOSIS — J449 Chronic obstructive pulmonary disease, unspecified: Secondary | ICD-10-CM | POA: Diagnosis not present

## 2020-07-15 DIAGNOSIS — M5136 Other intervertebral disc degeneration, lumbar region: Secondary | ICD-10-CM | POA: Diagnosis not present

## 2020-07-15 DIAGNOSIS — M5459 Other low back pain: Secondary | ICD-10-CM | POA: Diagnosis not present

## 2020-07-15 DIAGNOSIS — M47816 Spondylosis without myelopathy or radiculopathy, lumbar region: Secondary | ICD-10-CM | POA: Diagnosis not present

## 2020-07-15 DIAGNOSIS — M2550 Pain in unspecified joint: Secondary | ICD-10-CM | POA: Diagnosis not present

## 2020-07-15 DIAGNOSIS — M160 Bilateral primary osteoarthritis of hip: Secondary | ICD-10-CM | POA: Diagnosis not present

## 2020-07-22 DIAGNOSIS — I10 Essential (primary) hypertension: Secondary | ICD-10-CM | POA: Diagnosis not present

## 2020-07-22 DIAGNOSIS — S46211S Strain of muscle, fascia and tendon of other parts of biceps, right arm, sequela: Secondary | ICD-10-CM | POA: Diagnosis not present

## 2020-07-22 DIAGNOSIS — D6869 Other thrombophilia: Secondary | ICD-10-CM | POA: Diagnosis not present

## 2020-07-22 DIAGNOSIS — Z299 Encounter for prophylactic measures, unspecified: Secondary | ICD-10-CM | POA: Diagnosis not present

## 2020-07-22 DIAGNOSIS — E1165 Type 2 diabetes mellitus with hyperglycemia: Secondary | ICD-10-CM | POA: Diagnosis not present

## 2020-07-23 ENCOUNTER — Ambulatory Visit (INDEPENDENT_AMBULATORY_CARE_PROVIDER_SITE_OTHER): Payer: Medicare Other | Admitting: Orthopaedic Surgery

## 2020-07-23 ENCOUNTER — Ambulatory Visit: Payer: Self-pay

## 2020-07-23 ENCOUNTER — Other Ambulatory Visit: Payer: Self-pay

## 2020-07-23 ENCOUNTER — Encounter: Payer: Self-pay | Admitting: Orthopaedic Surgery

## 2020-07-23 VITALS — BP 116/77 | HR 68 | Ht 66.0 in | Wt 188.0 lb

## 2020-07-23 DIAGNOSIS — M25511 Pain in right shoulder: Secondary | ICD-10-CM | POA: Diagnosis not present

## 2020-07-23 NOTE — Progress Notes (Signed)
Office Visit Note   Patient: Patrick Aguilar           Date of Birth: 10-25-1950           MRN: [id number] Visit Date: 07/23/2020              Requested by: Kirstie Peri, MD [address] [place],  [place] [address] PCP: Kirstie Peri, MD   Assessment & Plan: Visit Diagnoses:  1. Acute pain of right shoulder     Plan: We discussed patient's biceps tendon rupture.  He understands he will have 90% strength without surgery.  He likely already has recurrent rotator cuff tear and by history of probably retore early in the postoperative time.  After his 1999 surgery.  He can return if he has increased problems he should do well with nonoperative treatment.  Follow-Up Instructions: No follow-ups on file.   Orders:  Orders Placed This Encounter  Procedures  . XR Shoulder Right   No orders of the defined types were placed in this encounter.     Procedures: No procedures performed   Clinical Data: No additional findings.   Subjective: Chief Complaint  Patient presents with  . Right Shoulder - Pain    DOI 07/18/2020    HPI 69 year old male was moving a piece of furniture on 07/18/2020.  He had previous right and left rotator cuff repairs done by Dr. Luiz Blare in 1999.  When he was moving furniture he felt a pop in his right shoulder and noticed right biceps muscle migrate distally.  He get his hand to the top of his head but states that the right shoulder ever since his surgery was not able to reach as high as the opposite left.  He has had back surgeries neck surgeries and is on oxycodone 10/325 chronically.  Patient is normally followed by Dr. Sherryll Burger.  Patient is retired.  Review of Systems all other systems are noncontributory.  Past pulmonary problems history of pulmonary embolism type 2 diabetes and rotator cuff surgeries of note.   Objective: Vital Signs: BP 116/77   Pulse 68   Ht 5\' 6"  (1.676 m)   Wt 188 lb (85.3 kg)   BMI 30.34 kg/m   Physical Exam Constitutional:       Appearance: He is well-developed.  HENT:     Head: Normocephalic and atraumatic.  Eyes:     Pupils: Pupils are equal, round, and reactive to light.  Neck:     Thyroid: No thyromegaly.     Trachea: No tracheal deviation.  Cardiovascular:     Rate and Rhythm: Normal rate.  Pulmonary:     Effort: Pulmonary effort is normal.     Breath sounds: No wheezing.  Abdominal:     General: Bowel sounds are normal.     Palpations: Abdomen is soft.  Skin:    General: Skin is warm and dry.     Capillary Refill: Capillary refill takes less than 2 seconds.  Neurological:     Mental Status: He is alert and oriented to person, place, and time.  Psychiatric:        Behavior: Behavior normal.        Thought Content: Thought content normal.        Judgment: Judgment normal.     Ortho Exam bilateral rotator cuff scars noted right and left.  Right biceps muscle distally migrating tender to palpation.  He can reach his arm top of his head but he can get his arm  completely over head is high as the opposite left shoulder. Specialty Comments:  No specialty comments available.  Imaging: No results found.   PMFS History: Patient Active Problem List   Diagnosis Date Noted  . Mixed hyperlipidemia 01/14/2020  . OA (osteoarthritis) of hip 03/06/2019  . Osteoarthritis of right hip 03/06/2019  . Cellulitis of left leg 07/08/2018  . Type 2 diabetes mellitus without complication (HCC) 07/08/2018  . Non-healing left groin open wound, initial encounter 01/08/2018  . Essential hypertension, benign 12/21/2017  . COPD (chronic obstructive pulmonary disease) (HCC) 12/21/2017  . Fournier gangrene   . Sialolithiasis of submandibular gland 03/12/2015   Past Medical History:  Diagnosis Date  . AKI (acute kidney injury) (HCC) 12/2017  . Anxiety   . Arthritis   . Complication of anesthesia    low respirations, low BP  . COPD (chronic obstructive pulmonary disease) (HCC)   . DDD (degenerative disc disease),  lumbar   . Diabetes mellitus without complication (HCC)    type 2  . Diastolic dysfunction 12/14/2017   Moderate noted on ECHO  . Fournier's gangrene in male   . GERD (gastroesophageal reflux disease)   . History of ARDS   . History of necrotizing fasciitis    Severe  . Hypertension   . Lumbar spondylosis   . Numbness and tingling of both upper extremities   . Pulmonary hypertension (HCC) 12/14/2017   Mild, noted on ECHO  . Respiratory failure, acute (HCC) 12/2017  . Septic shock (HCC) 12/2017  . Shortness of breath dyspnea   . Testicular pain, left   . Wears glasses     Family History  Problem Relation Age of Onset  . Lung cancer Mother   . Bladder Cancer Father     Past Surgical History:  Procedure Laterality Date  . APPENDECTOMY    . APPLICATION OF A-CELL OF CHEST/ABDOMEN N/A 01/08/2018   Procedure: APPLICATION OF A-CELL OF GROIN;  Surgeon: Peggye Form, DO;  Location: [facility];  Service: Plastics;  Laterality: N/A;  . APPLICATION OF A-CELL OF EXTREMITY N/A 12/25/2017   Procedure: APPLICATION OF A-CELL;  Surgeon: Peggye Form, DO;  Location: [facility];  Service: Plastics;  Laterality: N/A;  . BACK SURGERY     x5  . CARDIAC CATHETERIZATION  06   neg  . CERVICAL DISC SURGERY     x2  . COLONOSCOPY    . DEBRIDEMENT AND CLOSURE WOUND N/A 01/26/2018   Procedure: REVISION OF PERINEUM WOUND WITH DEBRIDEMENT, PARTIAL CLOSURE OF PERINEUM, PLACEMENT OF CELLERATE COLLAGEN;  Surgeon: Peggye Form, DO;  Location: [facility];  Service: Plastics;  Laterality: N/A;  . DENTAL SURGERY     teeth extractions  . groin wound  01/08/2018   : EXCISION OF GROIN WOUND WITH PLACEMENT OF ACELL, AND PRIMARY WOUND CLOSURE (N/A Scrotum)  . I & D EXTREMITY N/A 03/12/2018   Procedure: IRRIGATION AND DEBRIDEMENT PERIMUM WOUND WITH CLOSURE;  Surgeon: Peggye Form, DO;  Location: [facility];  Service: Plastics;  Laterality: N/A;  . INCISION AND DRAINAGE ABSCESS Left 07/11/2018   Procedure:  INCISION AND DRAINAGE ABSCESS LEFT THIGH;  Surgeon: Marcine Matar, MD;  Location: [facility];  Service: Urology;  Laterality: Left;  . INCISION AND DRAINAGE OF WOUND N/A 12/25/2017   Procedure: Irrigation and debridement of Fournier's of scrotum with placement of testes in subcutaneous thigh pockets and Acell placement;  Surgeon: Peggye Form, DO;  Location: [facility];  Service: Plastics;  Laterality: N/A;  .  INCISION AND DRAINAGE OF WOUND N/A 01/08/2018   Procedure: EXCISION OF GROIN WOUND WITH PLACEMENT OF ACELL, AND PRIMARY WOUND CLOSURE;  Surgeon: Peggye Form, DO;  Location: [facility];  Service: Plastics;  Laterality: N/A;  . ORCHIECTOMY N/A 12/13/2017   Procedure: EXCISION OF SCROTUM AND DEBRIDEMENT OF PENIS;  Surgeon: Marcine Matar, MD;  Location: [facility];  Service: Urology;  Laterality: N/A;  . ORCHIECTOMY Left 06/22/2018   Procedure: ORCHIECTOMY;  Surgeon: Marcine Matar, MD;  Location[facility]  Service: Urology;  Laterality: Left;  . PLANTAR FASCIA SURGERY Bilateral   . shoulders Bilateral    rotator cuff  . SUBMANDIBULAR GLAND EXCISION Left 03/12/2015   Procedure: LEFT SUBMANDIBULAR GLAND RESECTION;  Surgeon: Christia Reading, MD;  Location: [facility] OR;  Service: ENT;  Laterality: Left;  . TONSILLECTOMY    . TOTAL HIP ARTHROPLASTY Right 03/06/2019   Procedure: TOTAL HIP ARTHROPLASTY ANTERIOR APPROACH;  Surgeon: Ollen Gross, MD;  Location: [facility];  Service: Orthopedics;  Laterality: Right;    Social History   Occupational History  . Not on file  Tobacco Use  . Smoking status: Former Smoker    Packs/day: 1.00    Years: 50.00    Pack years: 50.00    Types: Cigarettes    Quit date: 12/13/2017    Years since quitting: 2.6  . Smokeless tobacco: Never Used  Vaping Use  . Vaping Use: Never used  Substance and Sexual Activity  . Alcohol use: No    Comment: quit alcohol 80's  . Drug use: No  . Sexual activity: Not on file

## 2020-08-06 DIAGNOSIS — R0902 Hypoxemia: Secondary | ICD-10-CM | POA: Diagnosis not present

## 2020-08-11 DIAGNOSIS — E119 Type 2 diabetes mellitus without complications: Secondary | ICD-10-CM | POA: Diagnosis not present

## 2020-08-11 DIAGNOSIS — J449 Chronic obstructive pulmonary disease, unspecified: Secondary | ICD-10-CM | POA: Diagnosis not present

## 2020-09-05 DIAGNOSIS — R0902 Hypoxemia: Secondary | ICD-10-CM | POA: Diagnosis not present

## 2020-09-10 DIAGNOSIS — E119 Type 2 diabetes mellitus without complications: Secondary | ICD-10-CM | POA: Diagnosis not present

## 2020-09-10 DIAGNOSIS — J449 Chronic obstructive pulmonary disease, unspecified: Secondary | ICD-10-CM | POA: Diagnosis not present

## 2020-10-06 DIAGNOSIS — U071 COVID-19: Secondary | ICD-10-CM | POA: Diagnosis not present

## 2020-10-06 DIAGNOSIS — R0902 Hypoxemia: Secondary | ICD-10-CM | POA: Diagnosis not present

## 2020-10-27 DIAGNOSIS — R52 Pain, unspecified: Secondary | ICD-10-CM | POA: Diagnosis not present

## 2020-10-27 DIAGNOSIS — E1165 Type 2 diabetes mellitus with hyperglycemia: Secondary | ICD-10-CM | POA: Diagnosis not present

## 2020-10-27 DIAGNOSIS — Z299 Encounter for prophylactic measures, unspecified: Secondary | ICD-10-CM | POA: Diagnosis not present

## 2020-10-27 DIAGNOSIS — J9611 Chronic respiratory failure with hypoxia: Secondary | ICD-10-CM | POA: Diagnosis not present

## 2020-10-27 DIAGNOSIS — Z87891 Personal history of nicotine dependence: Secondary | ICD-10-CM | POA: Diagnosis not present

## 2020-10-27 DIAGNOSIS — I10 Essential (primary) hypertension: Secondary | ICD-10-CM | POA: Diagnosis not present

## 2020-10-27 DIAGNOSIS — R519 Headache, unspecified: Secondary | ICD-10-CM | POA: Diagnosis not present

## 2020-11-06 DIAGNOSIS — U071 COVID-19: Secondary | ICD-10-CM | POA: Diagnosis not present

## 2020-11-06 DIAGNOSIS — R0902 Hypoxemia: Secondary | ICD-10-CM | POA: Diagnosis not present

## 2020-11-09 DIAGNOSIS — M542 Cervicalgia: Secondary | ICD-10-CM | POA: Diagnosis not present

## 2020-11-09 DIAGNOSIS — E7800 Pure hypercholesterolemia, unspecified: Secondary | ICD-10-CM | POA: Diagnosis not present

## 2020-11-09 DIAGNOSIS — Z299 Encounter for prophylactic measures, unspecified: Secondary | ICD-10-CM | POA: Diagnosis not present

## 2020-11-09 DIAGNOSIS — I10 Essential (primary) hypertension: Secondary | ICD-10-CM | POA: Diagnosis not present

## 2020-11-09 DIAGNOSIS — K2100 Gastro-esophageal reflux disease with esophagitis, without bleeding: Secondary | ICD-10-CM | POA: Diagnosis not present

## 2020-11-09 DIAGNOSIS — E1165 Type 2 diabetes mellitus with hyperglycemia: Secondary | ICD-10-CM | POA: Diagnosis not present

## 2020-11-12 DIAGNOSIS — Z79891 Long term (current) use of opiate analgesic: Secondary | ICD-10-CM | POA: Diagnosis not present

## 2020-11-12 DIAGNOSIS — M2550 Pain in unspecified joint: Secondary | ICD-10-CM | POA: Diagnosis not present

## 2020-11-12 DIAGNOSIS — M5459 Other low back pain: Secondary | ICD-10-CM | POA: Diagnosis not present

## 2020-11-12 DIAGNOSIS — G894 Chronic pain syndrome: Secondary | ICD-10-CM | POA: Diagnosis not present

## 2020-11-12 DIAGNOSIS — M47816 Spondylosis without myelopathy or radiculopathy, lumbar region: Secondary | ICD-10-CM | POA: Diagnosis not present

## 2020-11-12 DIAGNOSIS — M160 Bilateral primary osteoarthritis of hip: Secondary | ICD-10-CM | POA: Diagnosis not present

## 2020-11-16 ENCOUNTER — Ambulatory Visit: Payer: Medicare Other | Admitting: "Endocrinology

## 2020-11-16 ENCOUNTER — Encounter: Payer: Medicare Other | Attending: "Endocrinology | Admitting: Nutrition

## 2020-11-16 ENCOUNTER — Encounter: Payer: Self-pay | Admitting: "Endocrinology

## 2020-11-16 ENCOUNTER — Other Ambulatory Visit: Payer: Self-pay

## 2020-11-16 VITALS — BP 144/82 | HR 80 | Ht 66.0 in | Wt 196.4 lb

## 2020-11-16 DIAGNOSIS — I10 Essential (primary) hypertension: Secondary | ICD-10-CM | POA: Diagnosis not present

## 2020-11-16 DIAGNOSIS — E119 Type 2 diabetes mellitus without complications: Secondary | ICD-10-CM | POA: Insufficient documentation

## 2020-11-16 DIAGNOSIS — E782 Mixed hyperlipidemia: Secondary | ICD-10-CM

## 2020-11-16 LAB — POCT GLYCOSYLATED HEMOGLOBIN (HGB A1C): HbA1c, POC (controlled diabetic range): 6.6 % (ref 0.0–7.0)

## 2020-11-16 NOTE — Patient Instructions (Signed)
Goals  Keep up the great job. Eat three meals per day Keep drinking water  Keep walking as tolerated Try breathing exercises

## 2020-11-16 NOTE — Progress Notes (Signed)
  Medical Nutrition Therapy:  Appt start time: 1345 end time: 1415 Assessment:  Primary concerns today: Diabetes Type 2. PMH: HTN, COPD, Fourneir gangrene, Hyperlipidemia, back problems, OA of right hip..  Saw Dr. Fransico Him today.  A1C 6.6% today.  Taking Metformin ER 500 mg/dl once a day.  A1C up from 6%. He notes he has been eating more bread recently. Gained 8 lbs since November 2021.  Metformin 500 mg once a day.   Lab Results  Component Value Date   HGBA1C 6.6 11/16/2020   CMP Latest Ref Rng & Units 05/13/2020 09/08/2019 03/08/2019  Glucose 65 - 99 mg/dL 400(Q) 676(P) 950(D)  BUN 7 - 25 mg/dL 25 32(I) 22  Creatinine 0.70 - 1.25 mg/dL 7.12 4.58 0.99  Sodium 135 - 146 mmol/L 140 139 137  Potassium 3.5 - 5.3 mmol/L 4.5 3.6 3.2(L)  Chloride 98 - 110 mmol/L 101 102 97(L)  CO2 20 - 32 mmol/L 28 26 26   Calcium 8.6 - 10.3 mg/dL 9.4 9.3 )  Total Protein 6.1 - 8.1 g/dL 6.3 6.9 -  Total Bilirubin 0.2 - 1.2 mg/dL 0.4 0.3 -  Alkaline Phos 38 - 126 U/L - 78 -  AST 10 - 35 U/L 16 18 -  ALT 9 - 46 U/L 24 26 -   Lipid Panel     Component Value Date/Time   TRIG 259 (H) 12/19/2017 0442     Preferred Learning Style:   No preference indicated   Learning Readiness:   Ready  Change in progress   MEDICATIONS:    DIETARY INTAKE: FBS: 24-hr recall:  B ( AM):  oatmeal and blueberries,  Snk ( AM): coffee L ( PM): bacon egg and cheese crissant , water  Snk ( PM): D ( PM): Vegetables soup,1 Snk ( PM):  Beverages: water,  Usual physical activity: ADL COPD and can't do much.  Estimated energy needs: 1600  calories 180 g carbohydrates 120 g protein 44 g fat  Progress Towards Goal(s):  In progress.   Nutritional Diagnosis:  NB-1.1 Food and nutrition-related knowledge deficit As related to Diabetes Type 2.  As evidenced by A1C 6.6%.    Intervention:  Nutrition and Diabetes education provided on My Plate, CHO counting, meal planning, portion sizes, timing of meals, avoiding  snacks between meals unless having a low blood sugar, target ranges for A1C and blood sugars, signs/symptoms and treatment of hyper/hypoglycemia, monitoring blood sugars, taking medications as prescribed, benefits of exercising 30 minutes per day and prevention of complications of DM. Goals  Keep up the great job. Eat three meals per day Keep drinking water  Keep walking as tolerated Try breathing exercises    Teaching Method Utilized:  Visual Hands on  Handouts given during visit include:  .   Barriers to learning/adherence to lifestyle change:COPD-can't exercise much. Demonstrated degree of understanding via:  Teach Back   Monitoring/Evaluation:  Dietary intake, exercise,, and body weight in 3 months.

## 2020-11-16 NOTE — Patient Instructions (Signed)

## 2020-11-16 NOTE — Progress Notes (Signed)
11/16/2020, 4:51 PM  Endocrinology follow-up note  Subjective:    Patient ID: Patrick Aguilar, male    DOB: [birth year].  Patrick Aguilar is being seen in follow-up after he was seen in consultation for management of currently controlled asymptomatic diabetes requested by  Monico Blitz, MD.   Past Medical History:  Diagnosis Date  . AKI (acute kidney injury) ([place]) 12/2017  . Anxiety   . Arthritis   . Complication of anesthesia    low respirations, low BP  . COPD (chronic obstructive pulmonary disease) ([place])   . DDD (degenerative disc disease), lumbar   . Diabetes mellitus without complication ([place])    type 2  . Diastolic dysfunction 67/59/1638   Moderate noted on ECHO  . Fournier's gangrene in male   . GERD (gastroesophageal reflux disease)   . History of ARDS   . History of necrotizing fasciitis    Severe  . Hypertension   . Lumbar spondylosis   . Numbness and tingling of both upper extremities   . Pulmonary hypertension ([place]) 12/14/2017   Mild, noted on ECHO  . Respiratory failure, acute ([place]) 12/2017  . Septic shock ([place]) 12/2017  . Shortness of breath dyspnea   . Testicular pain, left   . Wears glasses     Past Surgical History:  Procedure Laterality Date  . APPENDECTOMY    . APPLICATION OF A-CELL OF CHEST/ABDOMEN N/A 01/08/2018   Procedure: APPLICATION OF A-CELL OF GROIN;  Surgeon: Wallace Going, DO;  Location: [facility];  Service: Plastics;  Laterality: N/A;  . APPLICATION OF A-CELL OF EXTREMITY N/A 12/25/2017   Procedure: APPLICATION OF A-CELL;  Surgeon: Wallace Going, DO;  Location: [facility];  Service: Plastics;  Laterality: N/A;  . BACK SURGERY     x5  . CARDIAC CATHETERIZATION  06   neg  . CERVICAL DISC SURGERY     x2  . COLONOSCOPY    . DEBRIDEMENT AND CLOSURE WOUND N/A 01/26/2018   Procedure: REVISION OF PERINEUM WOUND WITH DEBRIDEMENT, PARTIAL CLOSURE OF PERINEUM, PLACEMENT OF [place];  Surgeon: Wallace Going, DO;  Location: [facility];  Service: Plastics;  Laterality: N/A;  . DENTAL SURGERY     teeth extractions  . groin wound  01/08/2018   : EXCISION OF GROIN WOUND WITH PLACEMENT OF ACELL, AND PRIMARY WOUND CLOSURE (N/A Scrotum)  . I & D EXTREMITY N/A 03/12/2018   Procedure: IRRIGATION AND DEBRIDEMENT PERIMUM WOUND WITH CLOSURE;  Surgeon: Wallace Going, DO;  Location: [facility];  Service: Plastics;  Laterality: N/A;  . INCISION AND DRAINAGE ABSCESS Left 07/11/2018   Procedure: INCISION AND DRAINAGE ABSCESS LEFT THIGH;  Surgeon: Franchot Gallo, MD;  Location[facility];  Service: Urology;  Laterality: Left;  . INCISION AND DRAINAGE OF WOUND N/A 12/25/2017   Procedure: Irrigation and debridement of Fournier's of scrotum with placement of testes in subcutaneous thigh pockets and Acell placement;  Surgeon: Wallace Going, DO;  Location: [facility];  Service: Plastics;  Laterality: N/A;  . INCISION AND DRAINAGE OF WOUND N/A 01/08/2018   Procedure: EXCISION OF GROIN WOUND WITH PLACEMENT OF ACELL, AND PRIMARY WOUND CLOSURE;  Surgeon: Wallace Going, DO;  Location: [facility];  Service: Plastics;  Laterality: N/A;  . ORCHIECTOMY N/A 12/13/2017   Procedure: EXCISION OF SCROTUM AND DEBRIDEMENT OF PENIS;  Surgeon: Franchot Gallo, MD;  Location[facility];  Service: Urology;  Laterality: N/A;  . ORCHIECTOMY Left 06/22/2018  Procedure: ORCHIECTOMY;  Surgeon: Franchot Gallo, MD;  Location: [facility];  Service: Urology;  Laterality: Left;  . PLANTAR FASCIA SURGERY Bilateral   . shoulders Bilateral    rotator cuff  . SUBMANDIBULAR GLAND EXCISION Left 03/12/2015   Procedure: LEFT SUBMANDIBULAR GLAND RESECTION;  Surgeon: Melida Quitter, MD;  Location: [facility];  Service: ENT;  Laterality: Left;  . TONSILLECTOMY    . TOTAL HIP ARTHROPLASTY Right 03/06/2019   Procedure: TOTAL HIP ARTHROPLASTY ANTERIOR APPROACH;  Surgeon: Gaynelle Arabian, MD;  Location: [facility];  Service: Orthopedics;   Laterality: Right;  168mn    Social History   Socioeconomic History  . Marital status: Single    Spouse name: Not on file  . Number of children: Not on file  . Years of education: Not on file  . Highest education level: Not on file  Occupational History  . Not on file  Tobacco Use  . Smoking status: Former Smoker    Packs/day: 1.00    Years: 50.00    Pack years: 50.00    Types: Cigarettes    Quit date: 12/13/2017    Years since quitting: 2.9  . Smokeless tobacco: Never Used  Vaping Use  . Vaping Use: Never used  Substance and Sexual Activity  . Alcohol use: No    Comment: quit alcohol 80's  . Drug use: No  . Sexual activity: Not on file  Other Topics Concern  . Not on file  Social History Narrative  . Not on file   Social Determinants of Health   Financial Resource Strain: Not on file  Food Insecurity: Not on file  Transportation Needs: Not on file  Physical Activity: Not on file  Stress: Not on file  Social Connections: Not on file    Family History  Problem Relation Age of Onset  . Lung cancer Mother   . Bladder Cancer Father     Outpatient Encounter Medications as of 11/16/2020  Medication Sig  . albuterol (PROVENTIL) (2.5 MG/3ML) 0.083% nebulizer solution Inhale 2.5 mg into the lungs every 6 (six) hours as needed for shortness of breath or wheezing.  .Marland Kitchenatenolol-chlorthalidone (TENORETIC) 50-25 MG tablet Take 1 tablet by mouth daily with breakfast.   . blood glucose meter kit and supplies KIT Dispense based on patient and insurance preference. Use up to four times daily as directed. (FOR ICD-9 250.00, 250.01).  .Marland Kitchendiclofenac (VOLTAREN) 75 MG EC tablet Take 75 mg by mouth 2 (two) times daily. (Patient not taking: Reported on 11/16/2020)  . doxazosin (CARDURA) 8 MG tablet Take 8 mg by mouth daily with breakfast.   . famotidine (PEPCID) 20 MG tablet Take 20 mg by mouth 2 (two) times daily.  . furosemide (LASIX) 20 MG tablet Take 20 mg by mouth daily after  breakfast.   . gabapentin (NEURONTIN) 800 MG tablet Take 800 mg by mouth every 6 (six) hours.   . metFORMIN (GLUCOPHAGE) 1000 MG tablet Take 500 mg by mouth daily after breakfast.  . Nutritional Supplements (JUICE PLUS FIBRE PO) Take 4 capsules by mouth daily after breakfast.   . oxyCODONE-acetaminophen (PERCOCET) 10-325 MG tablet Take 1 tablet by mouth every 6 (six) hours.  . polyethylene glycol (MIRALAX / GLYCOLAX) packet Take 17 g by mouth daily as needed for mild constipation. (Patient taking differently: Take 17 g by mouth daily after breakfast. )  . potassium chloride SA (K-DUR,KLOR-CON) 20 MEQ tablet Take 40 mEq by mouth daily after breakfast.   . rosuvastatin (CRESTOR)  5 MG tablet Take 5 mg by mouth every Monday.  (Patient not taking: Reported on 07/23/2020)  . traZODone (DESYREL) 100 MG tablet Take 100 mg by mouth at bedtime.    No facility-administered encounter medications on file as of 11/16/2020.    ALLERGIES: Allergies  Allergen Reactions  . Tramadol Other (See Comments)    tremor  . Adenosine     can't tolerate  05/2005  . Bupropion     Nausea  . Morphine And Related Itching    VACCINATION STATUS:  There is no immunization history on file for this patient.  Diabetes He presents for his follow-up diabetic visit. He has type 2 diabetes mellitus. Onset time: She was diagnosed at approximate age of 20 years. His disease course has been improving. There are no hypoglycemic associated symptoms. Pertinent negatives for hypoglycemia include no confusion, headaches, pallor or seizures. There are no diabetic associated symptoms. Pertinent negatives for diabetes include no chest pain, no fatigue, no polydipsia, no polyphagia, no polyuria and no weakness. There are no hypoglycemic complications. Symptoms are improving. There are no diabetic complications. Risk factors for coronary artery disease include diabetes mellitus, dyslipidemia, hypertension, male sex and sedentary lifestyle.  Current diabetic treatments: Is currently on Metformin 1000 mg p.o. twice daily.  He wants to come off of metformin altogether. His weight is decreasing steadily. He is following a generally unhealthy diet. When asked about meal planning, he reported none. He has not had a previous visit with a dietitian. He participates in exercise intermittently. His home blood glucose trend is decreasing steadily. (His point-of-care A1c 6.6%, does not monitor blood glucose regularly.  He is only on Metformin 500 mg p.o. daily.     ) An ACE inhibitor/angiotensin II receptor blocker is being taken.  Hyperlipidemia This is a chronic problem. The current episode started more than 1 year ago. Exacerbating diseases include diabetes. Pertinent negatives include no chest pain, myalgias or shortness of breath. Risk factors for coronary artery disease include diabetes mellitus, dyslipidemia, male sex, hypertension and a sedentary lifestyle.  Hypertension This is a chronic problem. The current episode started more than 1 year ago. The problem is controlled. Pertinent negatives include no chest pain, headaches, neck pain, palpitations or shortness of breath. Risk factors for coronary artery disease include diabetes mellitus, dyslipidemia, sedentary lifestyle and smoking/tobacco exposure. Past treatments include beta blockers and diuretics. Hypertensive end-organ damage includes heart failure.     Review of Systems  Constitutional: Negative for chills, fatigue, fever and unexpected weight change.  HENT: Negative for dental problem, mouth sores and trouble swallowing.   Eyes: Negative for visual disturbance.  Respiratory: Negative for cough, choking, chest tightness, shortness of breath and wheezing.   Cardiovascular: Negative for chest pain, palpitations and leg swelling.  Gastrointestinal: Negative for abdominal distention, abdominal pain, constipation, diarrhea, nausea and vomiting.  Endocrine: Negative for polydipsia,  polyphagia and polyuria.  Genitourinary: Negative for dysuria, flank pain, hematuria and urgency.  Musculoskeletal: Positive for gait problem. Negative for back pain, myalgias and neck pain.       He walks with a cane due to back problem giving him disequilibrium.  Skin: Negative for pallor, rash and wound.  Neurological: Negative for seizures, syncope, weakness, numbness and headaches.  Psychiatric/Behavioral: Negative for confusion and dysphoric mood.    Objective:    Vitals with BMI 11/16/2020 07/23/2020 05/19/2020  Height _0  _1  _2   Weight 196 lbs 6 oz 188 lbs 186 lbs  BMI 31.71 30.36 28.29  Systolic 409 811 -  Diastolic 82 77 -  Pulse 80 68 -    BP (!) 144/82   Pulse 80   Ht _0  (1.676 m)   Wt 196 lb 6.4 oz (89.1 kg)   BMI 31.70 kg/m   Wt Readings from Last 3 Encounters:  11/16/20 196 lb 6.4 oz (89.1 kg)  07/23/20 188 lb (85.3 kg)  05/19/20 186 lb (84.4 kg)     Physical Exam Constitutional:      General: He is not in acute distress.    Appearance: He is well-developed.  HENT:     Head: Normocephalic and atraumatic.  Neck:     Thyroid: No thyromegaly.     Trachea: No tracheal deviation.  Cardiovascular:     Rate and Rhythm: Normal rate.     Pulses:          Dorsalis pedis pulses are 1+ on the right side and 1+ on the left side.       Posterior tibial pulses are 1+ on the right side and 1+ on the left side.     Heart sounds: S1 normal and S2 normal. No murmur heard. No gallop.   Pulmonary:     Effort: Pulmonary effort is normal. No respiratory distress.     Breath sounds: No wheezing.  Abdominal:     General: There is no distension.     Tenderness: There is no abdominal tenderness. There is no guarding.  Musculoskeletal:     Right shoulder: No swelling or deformity.     Cervical back: Normal range of motion and neck supple.  Skin:    General: Skin is warm and dry.     Findings: No rash.     Nails: There is no clubbing.  Neurological:     Mental  Status: He is alert and oriented to person, place, and time.     Cranial Nerves: No cranial nerve deficit.     Sensory: No sensory deficit.     Gait: Gait normal.     Deep Tendon Reflexes: Reflexes are normal and symmetric.  Psychiatric:        Speech: Speech normal.        Behavior: Behavior normal. Behavior is cooperative.        Thought Content: Thought content normal.        Judgment: Judgment normal.     CMP ( most recent) CMP     Component Value Date/Time   NA 140 05/13/2020 0000   K 4.5 05/13/2020 0000   CL 101 05/13/2020 0000   CO2 28 05/13/2020 0000   GLUCOSE 150 (H) 05/13/2020 0000   BUN 25 05/13/2020 0000   CREATININE 1.13 05/13/2020 0000   CALCIUM 9.4 05/13/2020 0000   PROT 6.3 05/13/2020 0000   ALBUMIN 4.1 09/08/2019 2018   AST 16 05/13/2020 0000   ALT 24 05/13/2020 0000   ALKPHOS 78 09/08/2019 2018   BILITOT 0.4 05/13/2020 0000   GFRNONAA 66 05/13/2020 0000   GFRAA 76 05/13/2020 0000     Diabetic Labs (most recent): Lab Results  Component Value Date   HGBA1C 6.6 11/16/2020   HGBA1C 6.0 04/21/2020   HGBA1C 6.6 (A) 01/14/2020     Lipid Panel ( most recent) Lipid Panel     Component Value Date/Time   TRIG 259 (H) 12/19/2017 0442     Assessment & Plan:   1. Type 2 diabetes mellitus without complication, without long-term current use of insulin ([place])  - Patrick Crumble  W Aguilar has currently controlled asymptomatic type 2 DM since  70 years of age.  His point-of-care A1c 6.6%, does not monitor blood glucose regularly.  He is only on Metformin 500 mg p.o. daily.    - I had a long discussion with him about the progressive nature of diabetes and the pathology behind its complications. -his diabetes is complicated by diastolic dysfunction and he remains at a high risk for more acute and chronic complications which include CAD, CVA, CKD, retinopathy, and neuropathy. These are all discussed in detail with him.  - I have counseled him on diet  and weight  management  by adopting a carbohydrate restricted/protein rich diet. Patient is encouraged to switch to  unprocessed or minimally processed     complex starch and increased protein intake (animal or plant source), fruits, and vegetables. -  he is advised to stick to a routine mealtimes to eat 3 meals  a day and avoid unnecessary snacks ( to snack only to correct hypoglycemia).   - he acknowledges that there is a room for improvement in his food and drink choices. - Suggestion is made for him to avoid simple carbohydrates  from his diet including Cakes, Sweet Desserts, Ice Cream, Soda (diet and regular), Sweet Tea, Candies, Chips, Cookies, Store Bought Juices, Alcohol in Excess of  1-2 drinks a day, Artificial Sweeteners,  Coffee Creamer, and "Sugar-free" Products, Lemonade. This will help patient to have more stable blood glucose profile and potentially avoid unintended weight gain.  - he will be scheduled with Jearld Fenton, RDN, CDE for diabetes education.  - I have approached him with the following individualized plan to manage  his diabetes and patient agrees:   -Patient wishes to come off of Metformin, however, he will benefit from staying on low-dose Metformin.  I approach him and he agrees to stay on Metformin 500 mg p.o. daily at breakfast.    -He has several choices for treating diabetes if Metformin is not enough.  He is not a candidate for SGLT2 inhibitors due to his prior history of Fournier's gangrene. -Given his hypertriglyceridemia, a low-dose basal insulin will be the best choice if it is necessary to switch.   - Specific targets for  A1c;  LDL, HDL,  and Triglycerides were discussed with the patient.  2) Blood Pressure /Hypertension: His blood pressure is not controlled to target.  He is advised to discontinue amlodipine.  He is advised to continue  atenolol/HCTZ mg p.o. daily with breakfast . 3) Lipids/Hyperlipidemia:   Review of his recent lipid panel showed improving  hypertriglyceridemia at 259.  He does not tolerate daily statin.  He is currently on Crestor 5 mg p.o. weekly.  He is advised to continue. Side effects and precautions discussed with him.  4)  Weight/Diet:  Body mass index is 31.7 kg/m.  -He is not a candidate for major weight loss.  I discussed with him the fact that loss of 5 - 10% of his  current body weight will have the most impact on his diabetes management.  Exercise, and detailed carbohydrates information provided  -  detailed on discharge instructions.  5) Chronic Care/Health Maintenance:  -he  is on Statin medications and  is encouraged to initiate and continue to follow up with Ophthalmology, Dentist,  Podiatrist at least yearly or according to recommendations, and advised to   stay away from smoking. I have recommended yearly flu vaccine and pneumonia vaccine at least every 5 years; moderate intensity exercise for  up to 150 minutes weekly; and  sleep for at least 7 hours a day.  - he is  advised to maintain close follow up with Monico Blitz, MD for primary care needs, as well as his other providers for optimal and coordinated care.   - Time spent on this patient care encounter:  40 min, of which > 50% was spent in  counseling and the rest reviewing his blood glucose logs , discussing his hypoglycemia and hyperglycemia episodes, reviewing his current and  previous labs / studies  ( including abstraction from other facilities) and medications  doses and developing a  long term treatment plan and documenting his care.   Please refer to Patient Instructions for Blood Glucose Monitoring and Insulin/Medications Dosing Guide"  in media tab for additional information. Please  also refer to " Patient Self Inventory" in the Media  tab for reviewed elements of pertinent patient history.  Cherylynn Ridges participated in the discussions, expressed understanding, and voiced agreement with the above plans.  All questions were answered to his  satisfaction. he is encouraged to contact clinic should he have any questions or concerns prior to his return visit.   Follow up plan: - Return in about 6 months (around 05/19/2021) for F/U with Pre-visit Labs.  Glade Lloyd, MD [facility] [address] [place], [place] [address] Phone: [phone]  Fax: [phone]    11/16/2020, 4:51 PM  This note was partially dictated with voice recognition software. Similar sounding words can be transcribed inadequately or may not  be corrected upon review.

## 2020-11-23 DIAGNOSIS — R21 Rash and other nonspecific skin eruption: Secondary | ICD-10-CM | POA: Diagnosis not present

## 2020-11-23 DIAGNOSIS — M542 Cervicalgia: Secondary | ICD-10-CM | POA: Diagnosis not present

## 2020-11-23 DIAGNOSIS — E1142 Type 2 diabetes mellitus with diabetic polyneuropathy: Secondary | ICD-10-CM | POA: Diagnosis not present

## 2020-11-23 DIAGNOSIS — Z299 Encounter for prophylactic measures, unspecified: Secondary | ICD-10-CM | POA: Diagnosis not present

## 2020-11-23 DIAGNOSIS — E1165 Type 2 diabetes mellitus with hyperglycemia: Secondary | ICD-10-CM | POA: Diagnosis not present

## 2020-12-04 DIAGNOSIS — U071 COVID-19: Secondary | ICD-10-CM | POA: Diagnosis not present

## 2020-12-04 DIAGNOSIS — R0902 Hypoxemia: Secondary | ICD-10-CM | POA: Diagnosis not present

## 2020-12-09 DIAGNOSIS — R52 Pain, unspecified: Secondary | ICD-10-CM | POA: Diagnosis not present

## 2020-12-09 DIAGNOSIS — Z299 Encounter for prophylactic measures, unspecified: Secondary | ICD-10-CM | POA: Diagnosis not present

## 2020-12-09 DIAGNOSIS — Z7189 Other specified counseling: Secondary | ICD-10-CM | POA: Diagnosis not present

## 2020-12-09 DIAGNOSIS — E1165 Type 2 diabetes mellitus with hyperglycemia: Secondary | ICD-10-CM | POA: Diagnosis not present

## 2020-12-09 DIAGNOSIS — Z0000 Encounter for general adult medical examination without abnormal findings: Secondary | ICD-10-CM | POA: Diagnosis not present

## 2020-12-09 DIAGNOSIS — E7800 Pure hypercholesterolemia, unspecified: Secondary | ICD-10-CM | POA: Diagnosis not present

## 2020-12-09 DIAGNOSIS — Z79899 Other long term (current) drug therapy: Secondary | ICD-10-CM | POA: Diagnosis not present

## 2020-12-09 DIAGNOSIS — E1142 Type 2 diabetes mellitus with diabetic polyneuropathy: Secondary | ICD-10-CM | POA: Diagnosis not present

## 2020-12-09 DIAGNOSIS — R5383 Other fatigue: Secondary | ICD-10-CM | POA: Diagnosis not present

## 2020-12-09 DIAGNOSIS — I10 Essential (primary) hypertension: Secondary | ICD-10-CM | POA: Diagnosis not present

## 2020-12-10 ENCOUNTER — Encounter: Payer: Self-pay | Admitting: Nutrition

## 2020-12-17 DIAGNOSIS — H2513 Age-related nuclear cataract, bilateral: Secondary | ICD-10-CM | POA: Diagnosis not present

## 2020-12-17 DIAGNOSIS — H40033 Anatomical narrow angle, bilateral: Secondary | ICD-10-CM | POA: Diagnosis not present

## 2020-12-21 DIAGNOSIS — M5459 Other low back pain: Secondary | ICD-10-CM | POA: Diagnosis not present

## 2020-12-21 DIAGNOSIS — M47816 Spondylosis without myelopathy or radiculopathy, lumbar region: Secondary | ICD-10-CM | POA: Diagnosis not present

## 2021-01-04 DIAGNOSIS — R0902 Hypoxemia: Secondary | ICD-10-CM | POA: Diagnosis not present

## 2021-01-04 DIAGNOSIS — U071 COVID-19: Secondary | ICD-10-CM | POA: Diagnosis not present

## 2021-01-27 DIAGNOSIS — E1165 Type 2 diabetes mellitus with hyperglycemia: Secondary | ICD-10-CM | POA: Diagnosis not present

## 2021-01-27 DIAGNOSIS — I10 Essential (primary) hypertension: Secondary | ICD-10-CM | POA: Diagnosis not present

## 2021-01-27 DIAGNOSIS — J9611 Chronic respiratory failure with hypoxia: Secondary | ICD-10-CM | POA: Diagnosis not present

## 2021-01-27 DIAGNOSIS — M1990 Unspecified osteoarthritis, unspecified site: Secondary | ICD-10-CM | POA: Diagnosis not present

## 2021-01-27 DIAGNOSIS — Z299 Encounter for prophylactic measures, unspecified: Secondary | ICD-10-CM | POA: Diagnosis not present

## 2021-01-27 DIAGNOSIS — D6869 Other thrombophilia: Secondary | ICD-10-CM | POA: Diagnosis not present

## 2021-02-03 DIAGNOSIS — R0902 Hypoxemia: Secondary | ICD-10-CM | POA: Diagnosis not present

## 2021-02-03 DIAGNOSIS — U071 COVID-19: Secondary | ICD-10-CM | POA: Diagnosis not present

## 2021-02-08 DIAGNOSIS — I10 Essential (primary) hypertension: Secondary | ICD-10-CM | POA: Diagnosis not present

## 2021-02-08 DIAGNOSIS — K2100 Gastro-esophageal reflux disease with esophagitis, without bleeding: Secondary | ICD-10-CM | POA: Diagnosis not present

## 2021-02-08 DIAGNOSIS — E7800 Pure hypercholesterolemia, unspecified: Secondary | ICD-10-CM | POA: Diagnosis not present

## 2021-02-08 DIAGNOSIS — E1165 Type 2 diabetes mellitus with hyperglycemia: Secondary | ICD-10-CM | POA: Diagnosis not present

## 2021-02-12 ENCOUNTER — Encounter: Payer: Self-pay | Admitting: Internal Medicine

## 2021-02-12 ENCOUNTER — Other Ambulatory Visit: Payer: Self-pay

## 2021-02-12 ENCOUNTER — Ambulatory Visit: Payer: Medicare Other | Admitting: Internal Medicine

## 2021-02-12 ENCOUNTER — Ambulatory Visit (HOSPITAL_COMMUNITY)
Admission: RE | Admit: 2021-02-12 | Discharge: 2021-02-12 | Disposition: A | Discharge status: Home / Self Care | Payer: Medicare Other | Source: Ambulatory Visit | Attending: Internal Medicine | Admitting: Internal Medicine

## 2021-02-12 ENCOUNTER — Other Ambulatory Visit (HOSPITAL_COMMUNITY)
Admission: RE | Admit: 2021-02-12 | Discharge: 2021-02-12 | Disposition: A | Discharge status: Home / Self Care | Payer: Medicare Other | Source: Ambulatory Visit | Attending: Internal Medicine | Admitting: Internal Medicine

## 2021-02-12 DIAGNOSIS — J449 Chronic obstructive pulmonary disease, unspecified: Secondary | ICD-10-CM | POA: Diagnosis not present

## 2021-02-12 DIAGNOSIS — R0600 Dyspnea, unspecified: Secondary | ICD-10-CM | POA: Insufficient documentation

## 2021-02-12 DIAGNOSIS — R0609 Other forms of dyspnea: Secondary | ICD-10-CM

## 2021-02-12 DIAGNOSIS — J9611 Chronic respiratory failure with hypoxia: Secondary | ICD-10-CM | POA: Diagnosis not present

## 2021-02-12 DIAGNOSIS — R0602 Shortness of breath: Secondary | ICD-10-CM | POA: Diagnosis not present

## 2021-02-12 LAB — CBC WITH DIFFERENTIAL/PLATELET
Abs Immature Granulocytes: 0.05 10*3/uL (ref 0.00–0.07)
Basophils Absolute: 0.1 10*3/uL (ref 0.0–0.1)
Basophils Relative: 1 %
Eosinophils Absolute: 0.1 10*3/uL (ref 0.0–0.5)
Eosinophils Relative: 1 %
HCT: 42.2 % (ref 39.0–52.0)
Hemoglobin: 13.8 g/dL (ref 13.0–17.0)
Immature Granulocytes: 1 %
Lymphocytes Relative: 19 %
Lymphs Abs: 2 10*3/uL (ref 0.7–4.0)
MCH: 29 pg (ref 26.0–34.0)
MCHC: 32.7 g/dL (ref 30.0–36.0)
MCV: 88.7 fL (ref 80.0–100.0)
Monocytes Absolute: 0.8 10*3/uL (ref 0.1–1.0)
Monocytes Relative: 8 %
Neutro Abs: 7.3 10*3/uL (ref 1.7–7.7)
Neutrophils Relative %: 70 %
Platelets: 289 10*3/uL (ref 150–400)
RBC: 4.76 MIL/uL (ref 4.22–5.81)
RDW: 14.2 % (ref 11.5–15.5)
WBC: 10.4 10*3/uL (ref 4.0–10.5)
nRBC: 0 % (ref 0.0–0.2)

## 2021-02-12 LAB — BASIC METABOLIC PANEL
Anion gap: 10 (ref 5–15)
BUN: 40 mg/dL — ABNORMAL HIGH (ref 8–23)
CO2: 29 mmol/L (ref 22–32)
Calcium: 9.5 mg/dL (ref 8.9–10.3)
Chloride: 99 mmol/L (ref 98–111)
Creatinine, Ser: 1.22 mg/dL (ref 0.61–1.24)
GFR, Estimated: >60 mL/min (ref >60)
Glucose, Bld: 212 mg/dL — ABNORMAL HIGH (ref 70–99)
Potassium: 3.5 mmol/L (ref 3.5–5.1)
Sodium: 138 mmol/L (ref 135–145)

## 2021-02-12 LAB — BRAIN NATRIURETIC PEPTIDE: B Natriuretic Peptide: 40 pg/mL (ref 0.0–100.0)

## 2021-02-12 LAB — SEDIMENTATION RATE: Sed Rate: 6 mm/hr (ref 0–16)

## 2021-02-12 LAB — D-DIMER, QUANTITATIVE: D-Dimer, Quant: 0.95 ug/mL-FEU — ABNORMAL HIGH (ref 0.00–0.50)

## 2021-02-12 LAB — TSH: TSH: 2.328 u[IU]/mL (ref 0.350–4.500)

## 2021-02-12 MED ORDER — STIOLTO RESPIMAT 2.5-2.5 MCG/ACT IN AERS
2.0000 | INHALATION_SPRAY | Freq: Every day | RESPIRATORY_TRACT | 11 refills | Status: DC
Start: 2021-02-12 — End: 2021-07-13

## 2021-02-12 MED ORDER — STIOLTO RESPIMAT 2.5-2.5 MCG/ACT IN AERS: 2.0000 | INHALATION_SPRAY | Freq: Every day | RESPIRATORY_TRACT | 0 refills | Status: DC

## 2021-02-12 NOTE — Assessment & Plan Note (Signed)
Quit smoking 2018 - Onset of symptoms ?  2000 ?  - 02/12/2021  After extensive coaching inhaler device,  effectiveness = 90%  Stiolto trial   Pt is Group B in terms of symptom/risk and laba/lama therefore appropriate rx at this point >>>  stiolto trial with appropriate saba  I spent extra time with pt today reviewing appropriate use of albuterol for prn use on exertion with the following points: 1) saba is for relief of sob that does not improve by walking a slower pace or resting but rather if the pt does not improve after trying this first. 2) If the pt is convinced, as many are, that saba helps recover from activity faster then it's easy to tell if this is the case by re-challenging : ie stop, take the inhaler, then p 5 minutes try the exact same activity (intensity of workload) that just caused the symptoms and see if they are substantially diminished or not after saba 3) if there is an activity that reproducibly causes the symptoms, try the saba 15 min before the activity on alternate days   If in fact the saba really does help, then fine to continue to use it prn but advised may need to look closer at the maintenance regimen being used to achieve better control of airways disease with exertion.

## 2021-02-12 NOTE — Addendum Note (Signed)
Addended by: Melonie Florida on: 02/12/2021 04:33 PM   Modules accepted: Orders

## 2021-02-12 NOTE — Progress Notes (Signed)
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     History of Present Illness  02/12/2021  Pulmonary/ 1st office eval/ Patrick Aguilar / [place] [facility] re doe/ocugh  Chief Complaint  Patient presents with  . Consult    Shortness of breath with activity  Dyspnea: push buggy one aisle and has to stop off 02  Walks around house easier while  on 3lpm  Cough: clear mucus x one hour  Sleep: on side / two pillows  SABA use: saba  02 3lpm POC   No obvious day to day or daytime variability or assoc e  purulent sputum or mucus plugs or hemoptysis or cp or chest tightness, subjective wheeze or overt sinus or hb symptoms.   Sleeping  without nocturnal  exacerbation  of respiratory  c/o's or need for noct saba. Also denies any obvious fluctuation of symptoms with weather or environmental changes or other aggravating or alleviating factors except as outlined above   No unusual exposure hx or h/o childhood pna/ asthma or knowledge of premature birth.  Current Allergies, Complete Past Medical History, Past Surgical History, Family History, and Social History were reviewed in [employer] record.  ROS  The following are not active complaints unless bolded Hoarseness, sore throat, dysphagia, dental problems, itching, sneezing,  nasal congestion or discharge of excess mucus or purulent secretions, ear ache,   fever, chills, sweats, unintended wt loss or wt gain, classically pleuritic or exertional cp,  orthopnea pnd  or arm/hand swelling  or leg swelling, presyncope, palpitations, abdominal pain, anorexia, nausea, vomiting, diarrhea  or change in bowel habits or change in bladder habits, change in stools or change in urine, dysuria, hematuria,  rash, arthralgias, visual complaints, headache, numbness, weakness or ataxia or problems with walking or coordination,  change in mood or  memory.           Past Medical History:  Diagnosis Date  . AKI (acute kidney injury) ([place]) 12/2017  . Anxiety   . Arthritis   . Complication of anesthesia    low respirations, low BP  . COPD (chronic obstructive pulmonary disease) ([place])   . DDD (degenerative disc disease), lumbar   . Diabetes mellitus without complication ([place])    type 2  . Diastolic dysfunction 09/38/1829   Moderate noted on ECHO  . Fournier's gangrene in male   . GERD (gastroesophageal reflux disease)   . History of ARDS   . History of necrotizing fasciitis    Severe  . Hypertension   . Lumbar spondylosis   . Numbness and tingling of both upper extremities   . Pulmonary hypertension ([place]) 12/14/2017   Mild, noted on ECHO  . Respiratory failure, acute ([place]) 12/2017  . Septic shock ([place]) 12/2017  . Shortness of breath dyspnea   . Testicular pain, left   . Wears glasses     Outpatient Medications Prior to Visit  Medication Sig  Dispense Refill  . albuterol (PROVENTIL) (2.5 MG/3ML) 0.083% nebulizer solution Inhale 2.5 mg into the lungs every 6 (six) hours as needed for shortness of breath or wheezing.    Marland Kitchen albuterol (VENTOLIN HFA) 108 (90 Base) MCG/ACT inhaler Inhale 2 puffs into the lungs every 6 (six) hours as needed for wheezing or shortness of breath.    Marland Kitchen atenolol-chlorthalidone (TENORETIC) 50-25 MG tablet Take 1 tablet by mouth daily with breakfast.     . blood glucose meter kit and supplies KIT Dispense based on patient and insurance preference. Use up to four times daily as directed. (FOR ICD-9 250.00, 250.01). 1 each 0  . diclofenac  (VOLTAREN) 75 MG EC tablet Take 75 mg by mouth 2 (two) times daily.    Marland Kitchen doxazosin (CARDURA) 8 MG tablet Take 8 mg by mouth daily with breakfast.     . famotidine (PEPCID) 20 MG tablet Take 20 mg by mouth 2 (two) times daily.    . furosemide (LASIX) 20 MG tablet Take 20 mg by mouth daily after breakfast.     . gabapentin (NEURONTIN) 800 MG tablet Take 800 mg by mouth every 6 (six) hours.   1  . metFORMIN (GLUCOPHAGE) 1000 MG tablet Take 500 mg by mouth daily after breakfast.    . Nutritional Supplements (JUICE PLUS FIBRE PO) Take 4 capsules by mouth daily after breakfast.     . oxyCODONE-acetaminophen (PERCOCET) 10-325 MG tablet Take 1 tablet by mouth every 6 (six) hours.    . pantoprazole (PROTONIX) 40 MG tablet Take 40 mg by mouth daily.    . polyethylene glycol (MIRALAX / GLYCOLAX) packet Take 17 g by mouth daily as needed for mild constipation. (Patient taking differently: Take 17 g by mouth daily after breakfast.) 14 each 0  . potassium chloride SA (K-DUR,KLOR-CON) 20 MEQ tablet Take 40 mEq by mouth daily after breakfast.   1  . rosuvastatin (CRESTOR) 5 MG tablet Take 5 mg by mouth every Monday.    . traZODone (DESYREL) 100 MG tablet Take 100 mg by mouth at bedtime.    Marland Kitchen aspirin EC 81 MG tablet Take 81 mg by mouth daily. Swallow whole. (Patient not taking: Reported on 02/12/2021)     No facility-administered medications prior to visit.     Objective:     BP 138/80 (BP Location: Left Arm, Cuff Size: Normal)   Pulse 76   Temp 97.8 F (36.6 C) (Temporal)   Ht '5\' 8"'  (1.727 m)   Wt 189 lb 9.6 oz (86 kg)   SpO2 96% Comment: Room air  BMI 28.83 kg/m   SpO2: 96 % (Room air)   HEENT : pt wearing mask not removed for exam due to covid - 19 concerns.   NECK :  without JVD/Nodes/TM/ nl carotid upstrokes bilaterally   LUNGS: no acc muscle use,  Min barrel  contour chest wall with bilateral  slightly decreased bs s audible wheeze and  without cough on insp or exp maneuvers and min   Hyperresonant  to  percussion bilaterally     CV:  RRR  no s3 or murmur or increase in P2, and no edema   ABD:  soft and nontender with pos end  insp Hoover's  in the supine position. No bruits or organomegaly appreciated, bowel sounds nl  MS:   Nl gait/  ext warm without deformities, calf tenderness, cyanosis or clubbing No obvious joint restrictions   SKIN: warm and dry without lesions x for L knee scar  NEURO:  alert, approp, nl sensorium with  no motor or cerebellar deficits apparent.         CXR PA and Lateral:   02/12/2021 :    I personally reviewed images and  impression as follows:   Mild copd changes, nothing acute    Labs ordered/ reviewed:      Chemistry      Component Value Date/Time   NA 138 02/12/2021 1109   K 3.5 02/12/2021 1109   CL 99 02/12/2021 1109   CO2 29 02/12/2021 1109   BUN 40 (H) 02/12/2021 1109   CREATININE 1.22 02/12/2021 1109   CREATININE 1.13 05/13/2020 0000      Component Value Date/Time   CALCIUM 9.5 02/12/2021 1109   ALKPHOS 78 09/08/2019 2018   AST 16 05/13/2020 0000   ALT 24 05/13/2020 0000   BILITOT 0.4 05/13/2020 0000        Lab Results  Component Value Date   WBC 10.4 02/12/2021   HGB 13.8 02/12/2021   HCT 42.2 02/12/2021   MCV 88.7 02/12/2021   PLT 289 02/12/2021       EOS                                                               0.1                                    02/12/2021   Lab Results  Component Value Date   DDIMER 0.95 (H) 02/12/2021      Lab Results  Component Value Date   TSH 2.328 02/12/2021     BNP   02/12/2021      =   40     Lab Results  Component Value Date   ESRSEDRATE 6 02/12/2021          Assessment   COPD  GOLD ?   Quit smoking 2018 - Onset of symptoms ?  2000 ?  - 02/12/2021  After extensive coaching inhaler device,  effectiveness = 90%  Stiolto trial   Pt is Group B in terms of symptom/risk and laba/lama therefore appropriate rx at this point >>>  stiolto trial with appropriate  saba  I spent extra time with pt today reviewing appropriate use of albuterol for prn use on exertion with the following points: 1) saba is for relief of sob that does not improve by walking a slower pace or resting but rather if the pt does not improve after trying this first. 2) If the pt is convinced, as many are, that saba helps recover from activity faster then it's easy to tell if this is the case by re-challenging : ie stop, take the inhaler, then p 5 minutes try the exact same activity (intensity of workload) that just caused the symptoms and see if they are substantially diminished or not after saba 3) if there is an activity that reproducibly causes the symptoms, try the saba 15 min before the activity on alternate days   If in fact the saba really does help, then fine to continue to use it prn but advised may need to look closer at the maintenance regimen being used to achieve better control of airways  disease with exertion.       Chronic respiratory failure with hypoxia (HCC) Onset p covid 19 infection complicated by PE  Jan 9509  -  02/12/2021   Walked RA  approx   300 ft  @ moderate pace  stopped due to  [employer] still 95%   No need for 02 ambulating/ would continue hs for now at 3lpm and regroup with pfts in 6 weeks   DOE (dyspnea on exertion) Labs look ok x for ddimer  while  high normal value (seen commonly in the elderly or chronically ill)  may miss small peripheral pe, the clot burden with sob is moderately high and the d dimer  has a very high neg pred value if used in this setting.  Up to 1.0 would be perfectly ok in this setting and so additonal w/u not needed other than venous dopplers to be complete as has had surgery on L knee remotely   Each maintenance medication was reviewed in detail including emphasizing most importantly the difference between maintenance and prns and under what circumstances the prns are to be triggered using an action plan format where  appropriate.  Total time for H and P, chart review, counseling, reviewing smi device(s) , directly observing portions of ambulatory 02 saturation study/ and generating customized AVS unique to this office visit / same day charting = 56 min                   Christinia Gully, MD 02/12/2021

## 2021-02-12 NOTE — Assessment & Plan Note (Addendum)
Labs look ok x for ddimer  while  high normal value (seen commonly in the elderly or chronically ill)  may miss small peripheral pe, the clot burden with sob is moderately high and the d dimer  has a very high neg pred value if used in this setting.  Up to 1.0 would be perfectly ok in this setting and so additonal w/u not needed other than venous dopplers to be complete as has had surgery on L knee remotely   Each maintenance medication was reviewed in detail including emphasizing most importantly the difference between maintenance and prns and under what circumstances the prns are to be triggered using an action plan format where appropriate.  Total time for H and P, chart review, counseling, reviewing smi device(s) , directly observing portions of ambulatory 02 saturation study/ and generating customized AVS unique to this office visit / same day charting = 56 min

## 2021-02-12 NOTE — Patient Instructions (Addendum)
Plan A = Automatic = Always=    stiolto 2 puffs each AM   Plan B = Backup (to supplement plan A, not to replace it) Only use your albuterol inhaler as a rescue medication to be used if you can't catch your breath by resting or doing a relaxed purse lip breathing pattern.  - The less you use it, the better it will work when you need it. - Ok to use the inhaler up to 2 puffs  every 4 hours if you must but call for appointment if use goes up over your usual need - Don't leave home without it !!  (think of it like the spare tire for your car)   Plan C = Crisis (instead of Plan B but only if Plan B stops working) - only use your albuterol nebulizer if you first try Plan B and it fails to help > ok to use the nebulizer up to every 4 hours but if start needing it regularly call for immediate appointment    Ok to try Try albuterol 15 min before an activity (on alternating days)  that you know would make you short of breath and see if it makes any difference and if makes none then don't take albuterol after activity unless you can't catch your breath as this means it's the resting that helps, not the albuterol.      Make sure you check your oxygen saturation  at your highest level of activity  to be sure it stays over 90% and adjust  02 flow upward to maintain this level if needed but remember to turn it back to previous settings when you stop (to conserve your supply).   Please remember to go to the lab and x-ray department at [facility]   for your tests - we will call you with the results when they are available.      Please schedule a follow up office visit in 6 weeks  with pfts on return  Add venous dopplers now

## 2021-02-12 NOTE — Assessment & Plan Note (Signed)
Onset p covid 19 infection complicated by PE  Jan 2021  -  02/12/2021   Walked RA  approx   300 ft  @ moderate pace  stopped due to  [employer] still 95%   No need for 02 ambulating/ would continue hs for now at 3lpm and regroup with pfts in 6 weeks

## 2021-02-12 NOTE — Progress Notes (Signed)
Called and left message on voicemail to please return phone call to go over lab results. Contact number provided.

## 2021-02-15 ENCOUNTER — Other Ambulatory Visit: Payer: Self-pay

## 2021-02-15 DIAGNOSIS — R0609 Other forms of dyspnea: Secondary | ICD-10-CM

## 2021-02-15 DIAGNOSIS — R0600 Dyspnea, unspecified: Secondary | ICD-10-CM

## 2021-02-15 NOTE — Addendum Note (Signed)
Addended by: Melonie Florida on: 02/15/2021 04:45 PM   Modules accepted: Orders

## 2021-02-15 NOTE — Addendum Note (Signed)
Addended by: Melonie Florida on: 02/15/2021 04:57 PM   Modules accepted: Orders

## 2021-02-15 NOTE — Progress Notes (Signed)
Patient returned phone call, confirmed name and birth date. Writer went over lab results per Dr Sherene Sires with patient. All questions answered and patient expressed full understanding of results and agreeable with Dr Thurston Hole recommendations for venous doppler bilaterally. Order placed per Dr Sherene Sires and patient aware he will get a phone call to schedule from [facility]. Nothing further needed at this time.

## 2021-02-17 ENCOUNTER — Encounter: Payer: Self-pay | Admitting: *Deleted

## 2021-02-18 ENCOUNTER — Other Ambulatory Visit: Payer: Self-pay

## 2021-02-18 ENCOUNTER — Ambulatory Visit (HOSPITAL_COMMUNITY)
Admission: RE | Admit: 2021-02-18 | Discharge: 2021-02-18 | Disposition: A | Discharge status: Home / Self Care | Payer: Medicare Other | Source: Ambulatory Visit | Attending: Internal Medicine | Admitting: Internal Medicine

## 2021-02-18 DIAGNOSIS — R0609 Other forms of dyspnea: Secondary | ICD-10-CM

## 2021-02-18 DIAGNOSIS — R0600 Dyspnea, unspecified: Secondary | ICD-10-CM | POA: Diagnosis not present

## 2021-02-18 LAB — IGE: IgE (Immunoglobulin E), Serum: 376 IU/mL (ref 6–495)

## 2021-02-22 ENCOUNTER — Encounter: Payer: Self-pay | Admitting: *Deleted

## 2021-02-22 NOTE — Progress Notes (Signed)
Spoke with pt and notified of results per Dr. Sherene Sires. Pt verbalized understanding and denied any questions. He wants to hold off on referral for now. Will keep next appt.

## 2021-03-01 DIAGNOSIS — I10 Essential (primary) hypertension: Secondary | ICD-10-CM | POA: Diagnosis not present

## 2021-03-01 DIAGNOSIS — R52 Pain, unspecified: Secondary | ICD-10-CM | POA: Diagnosis not present

## 2021-03-01 DIAGNOSIS — Z299 Encounter for prophylactic measures, unspecified: Secondary | ICD-10-CM | POA: Diagnosis not present

## 2021-03-01 DIAGNOSIS — J449 Chronic obstructive pulmonary disease, unspecified: Secondary | ICD-10-CM | POA: Diagnosis not present

## 2021-03-01 DIAGNOSIS — J441 Chronic obstructive pulmonary disease with (acute) exacerbation: Secondary | ICD-10-CM | POA: Diagnosis not present

## 2021-03-06 DIAGNOSIS — R0902 Hypoxemia: Secondary | ICD-10-CM | POA: Diagnosis not present

## 2021-03-06 DIAGNOSIS — U071 COVID-19: Secondary | ICD-10-CM | POA: Diagnosis not present

## 2021-03-19 ENCOUNTER — Other Ambulatory Visit: Payer: Self-pay

## 2021-03-19 ENCOUNTER — Other Ambulatory Visit (HOSPITAL_COMMUNITY)
Admission: RE | Admit: 2021-03-19 | Discharge: 2021-03-19 | Disposition: A | Discharge status: Home / Self Care | Payer: Medicare Other | Source: Ambulatory Visit | Attending: Internal Medicine | Admitting: Internal Medicine

## 2021-03-19 DIAGNOSIS — Z01812 Encounter for preprocedural laboratory examination: Secondary | ICD-10-CM | POA: Diagnosis not present

## 2021-03-19 DIAGNOSIS — Z20822 Contact with and (suspected) exposure to covid-19: Secondary | ICD-10-CM | POA: Insufficient documentation

## 2021-03-19 LAB — SARS CORONAVIRUS 2 (TAT 6-24 HRS): SARS Coronavirus 2: NEGATIVE

## 2021-03-23 ENCOUNTER — Other Ambulatory Visit: Payer: Self-pay

## 2021-03-23 ENCOUNTER — Encounter: Payer: Self-pay | Admitting: *Deleted

## 2021-03-23 ENCOUNTER — Ambulatory Visit (HOSPITAL_COMMUNITY)
Admission: RE | Admit: 2021-03-23 | Discharge: 2021-03-23 | Disposition: A | Discharge status: Home / Self Care | Payer: Medicare Other | Source: Ambulatory Visit | Attending: Internal Medicine | Admitting: Internal Medicine

## 2021-03-23 DIAGNOSIS — R0609 Other forms of dyspnea: Secondary | ICD-10-CM

## 2021-03-23 DIAGNOSIS — R0600 Dyspnea, unspecified: Secondary | ICD-10-CM | POA: Diagnosis not present

## 2021-03-23 DIAGNOSIS — J449 Chronic obstructive pulmonary disease, unspecified: Secondary | ICD-10-CM | POA: Diagnosis not present

## 2021-03-23 LAB — PULMONARY FUNCTION TEST
DL/VA % pred: 50 %
DL/VA: 2.07 ml/min/mmHg/L
DLCO unc % pred: 44 %
DLCO unc: 10.93 ml/min/mmHg
FEF 25-75 Post: 3.06 L/sec
FEF 25-75 Pre: 2.16 L/sec
FEF2575-%Change-Post: 41 %
FEF2575-%Pred-Post: 133 %
FEF2575-%Pred-Pre: 94 %
FEV1-%Change-Post: 17 %
FEV1-%Pred-Post: 102 %
FEV1-%Pred-Pre: 87 %
FEV1-Post: 3.08 L
FEV1-Pre: 2.62 L
FEV1FVC-%Change-Post: 10 %
FEV1FVC-%Pred-Pre: 95 %
FEV6-%Change-Post: 8 %
FEV6-%Pred-Post: 101 %
FEV6-%Pred-Pre: 93 %
FEV6-Post: 3.93 L
FEV6-Pre: 3.62 L
FEV6FVC-%Change-Post: 0 %
FEV6FVC-%Pred-Post: 105 %
FEV6FVC-%Pred-Pre: 106 %
FVC-%Change-Post: 6 %
FVC-%Pred-Post: 96 %
FVC-%Pred-Pre: 91 %
FVC-Post: 3.96 L
FVC-Pre: 3.74 L
Post FEV1/FVC ratio: 78 %
Post FEV6/FVC ratio: 99 %
Pre FEV1/FVC ratio: 70 %
Pre FEV6/FVC Ratio: 100 %
RV % pred: 9 %
RV: 0.23 L
TLC % pred: 64 %
TLC: 4.28 L

## 2021-03-23 MED ORDER — ALBUTEROL SULFATE (2.5 MG/3ML) 0.083% IN NEBU
2.5000 mg | INHALATION_SOLUTION | Freq: Once | RESPIRATORY_TRACT | Status: AC
  Administered 2021-03-23: 2.5 mg via RESPIRATORY_TRACT

## 2021-03-26 ENCOUNTER — Other Ambulatory Visit: Payer: Self-pay

## 2021-03-26 ENCOUNTER — Ambulatory Visit: Payer: Medicare Other | Admitting: Internal Medicine

## 2021-03-26 ENCOUNTER — Encounter: Payer: Self-pay | Admitting: Internal Medicine

## 2021-03-26 DIAGNOSIS — J9611 Chronic respiratory failure with hypoxia: Secondary | ICD-10-CM

## 2021-03-26 DIAGNOSIS — R0609 Other forms of dyspnea: Secondary | ICD-10-CM

## 2021-03-26 DIAGNOSIS — J449 Chronic obstructive pulmonary disease, unspecified: Secondary | ICD-10-CM

## 2021-03-26 DIAGNOSIS — R0600 Dyspnea, unspecified: Secondary | ICD-10-CM | POA: Diagnosis not present

## 2021-03-26 NOTE — Progress Notes (Signed)
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     History of Present Illness  02/12/2021  Pulmonary/ 1st office eval/ Christin Moline / [place] [facility] re doe/ocugh  Chief Complaint  Patient presents with   Consult    Shortness of breath with activity  Dyspnea: push buggy one aisle and has to stop off 02  Walks around house easier while  on 3lpm  Cough: clear mucus x one hour  Sleep: on side / two pillows  SABA use: saba  02 3lpm POC  Rec Plan A = Automatic = Always=    stiolto 2 puffs each AM  Plan B = Backup (to supplement plan A, not to replace it) Only use your albuterol inhaler as a rescue medication Plan C = Crisis (instead of Plan B but only if Plan B stops working) - only use your albuterol nebulizer if you first try Plan B and it fails to help   Ok to try Try albuterol 15 min before an activity (on alternating days)  that you know would make you short of breath a Make sure you check your oxygen saturation  at your highest level of activity  to be sure it stays over 90%   Please schedule a follow up office visit in 6 weeks  with pfts on return  Add venous dopplers negative for DVT   03/26/2021  f/u ov/[place] office/Maizie Garno re: GOLD 0  doe/ uacs maint on stiolto  Chief Complaint  Patient presents with   Follow-up    Pt states hard to say if his breathing has improved b/c he has been inactive due to hot weather. He has not had to use his rescue inhaler or neb.    Dyspnea:  not really very active / does  fine ine ac and more limited by back  Cough: none now  Sleeping: on side 2 pillows  SABA use: none  02: still has not using, not monitoring  Covid status: vax x 3      No obvious day to day or daytime variability or assoc excess/ purulent sputum or mucus plugs or hemoptysis or cp or chest tightness, subjective wheeze or overt sinus or hb symptoms.   Sleeping  without nocturnal  or early am exacerbation  of respiratory  c/o's or need for noct saba. Also denies any obvious fluctuation of symptoms with weather or environmental changes or other aggravating or alleviating factors except as outlined above   No unusual exposure hx or h/o childhood pna/ asthma or knowledge of premature birth.  Current Allergies, Complete Past Medical History, Past Surgical History, Family History, and Social History were reviewed in [employer] record.  ROS  The following are not active complaints unless bolded Hoarseness, sore throat, dysphagia, dental problems, itching, sneezing,  nasal congestion or discharge of excess mucus or purulent secretions, ear ache,   fever, chills, sweats, unintended wt loss or wt gain,  classically pleuritic or exertional cp,  orthopnea pnd or arm/hand swelling  or leg swelling, presyncope, palpitations, abdominal pain, anorexia, nausea, vomiting, diarrhea  or change in bowel habits or change in bladder habits, change in stools or change in urine, dysuria, hematuria,  rash, arthralgias, visual complaints, headache, numbness, weakness or ataxia or problems with walking or coordination,  change in mood or  memory.        Current Meds  Medication Sig   albuterol (PROVENTIL) (2.5 MG/3ML) 0.083% nebulizer solution Inhale 2.5 mg into the lungs every 6 (six) hours as needed for shortness of breath or wheezing.   albuterol (VENTOLIN HFA) 108 (90 Base) MCG/ACT inhaler Inhale 2 puffs into the lungs every 6 (six) hours as needed for wheezing or shortness of breath.   aspirin  EC 81 MG tablet Take 81 mg by mouth daily. Swallow whole.   atenolol-chlorthalidone (TENORETIC) 50-25 MG tablet Take 1 tablet by mouth daily with breakfast.    blood glucose meter kit and supplies KIT Dispense based on patient and insurance preference. Use up to four times daily as directed. (FOR ICD-9 250.00, 250.01).   diclofenac (VOLTAREN) 75 MG EC tablet Take 75 mg by mouth 2 (two) times daily.   doxazosin (CARDURA) 8 MG tablet Take 8 mg by mouth daily with breakfast.    famotidine (PEPCID) 20 MG tablet Take 20 mg by mouth 2 (two) times daily.   furosemide (LASIX) 20 MG tablet Take 20 mg by mouth daily after breakfast.    gabapentin (NEURONTIN) 800 MG tablet Take 800 mg by mouth every 6 (six) hours.    metFORMIN (GLUCOPHAGE) 1000 MG tablet Take 500 mg by mouth daily after breakfast.   Nutritional Supplements (JUICE PLUS FIBRE PO) Take 4 capsules by mouth daily after breakfast.    oxyCODONE-acetaminophen (PERCOCET) 10-325 MG tablet Take 1 tablet by mouth every 6 (six) hours.   pantoprazole (PROTONIX) 40 MG tablet Take 40 mg by mouth daily.   polyethylene glycol (MIRALAX / GLYCOLAX) packet Take 17 g by mouth daily as needed for mild constipation. (Patient taking differently: Take 17 g by mouth daily after breakfast.)   potassium chloride SA (K-DUR,KLOR-CON) 20 MEQ tablet Take 40 mEq by mouth daily after breakfast.    rosuvastatin (CRESTOR) 5 MG tablet Take 5 mg by mouth every Monday.   Tiotropium Bromide-Olodaterol (STIOLTO RESPIMAT) 2.5-2.5 MCG/ACT AERS Inhale 2 puffs into the lungs daily.   traZODone (DESYREL) 100 MG tablet Take 100 mg by mouth at bedtime.           Past Medical History:  Diagnosis Date   AKI (acute kidney injury) ([place]) 12/2017   Anxiety    Arthritis    Complication of anesthesia    low respirations, low BP   COPD (chronic obstructive pulmonary disease) (HCC)    DDD (degenerative disc disease), lumbar    Diabetes mellitus without complication ([place])    type 2    Diastolic dysfunction 22/97/9892   Moderate noted on ECHO   Fournier's gangrene in male    GERD (gastroesophageal reflux disease)    History of ARDS    History of necrotizing fasciitis    Severe   Hypertension    Lumbar spondylosis    Numbness and tingling of both upper extremities    Pulmonary hypertension ([place]) 12/14/2017   Mild, noted on ECHO   Respiratory failure, acute ([place]) 12/2017   Septic shock ([place]) 12/2017   Shortness of breath dyspnea    Testicular pain, left  Wears glasses        Objective:     Wt Readings from Last 3 Encounters:  03/26/21 196 lb (88.9 kg)  02/12/21 189 lb 9.6 oz (86 kg)  11/16/20 196 lb 6.4 oz (89.1 kg)      Vital signs reviewed  03/26/2021  - Note at rest 02 sats  96% on RA   General appearance:    obese wm red shirt/ black suspenders     HEENT : pt wearing mask not removed for exam due to covid - 19 concerns.   NECK :  without JVD/Nodes/TM/ nl carotid upstrokes bilaterally   LUNGS: no acc muscle use,  Min barrel  contour chest wall with bilateral  slightly decreased bs s audible wheeze and  without cough on insp or exp maneuvers and min  Hyperresonant  to  percussion bilaterally     CV:  RRR  no s3 or murmur or increase in P2, and no edema   ABD:  soft and nontender with pos end  insp Hoover's  in the supine position. No bruits or organomegaly appreciated, bowel sounds nl  MS:   Nl gait/  ext warm without deformities, calf tenderness, cyanosis or clubbing No obvious joint restrictions   SKIN: warm and dry without lesions    NEURO:  alert, approp, nl sensorium with  no motor or cerebellar deficits apparent.                    Assessment

## 2021-03-26 NOTE — Patient Instructions (Addendum)
We will to schedule you an echocardiogram (to be sure the R heart is nl now)   Stay on Stiolto 2 pffs each am    Ok to Try albuterol 15 min before an activity (on alternating days once with inhaler and once with the nebulizer and once with nothing )  that you know would make you short of breath and see if it makes any difference and if makes none then don't take albuterol after activity unless you can't catch your breath as this means it's the resting that helps, not the albuterol.  To get the most out of exercise, you need to be continuously aware that you are short of breath, but never out of breath, for at least 30 minutes daily. As you improve, it will actually be easier for you to do the same amount of exercise  in  30 minutes so always push to the level where you are short of breath.  Once you can do this, push for longer duration or repeat it after at least 4 hours of rest.  Make sure you check your oxygen saturations at highest level of activity      Please schedule a follow up office visit in 6 weeks, call sooner if needed   Add:  consider change to symbicort 80 next ov

## 2021-03-27 ENCOUNTER — Encounter: Payer: Self-pay | Admitting: Internal Medicine

## 2021-03-27 NOTE — Assessment & Plan Note (Addendum)
Onset p covid 19 infection complicated by PE  Jan 2021  -  02/12/2021   Walked RA  approx   300 ft  @ moderate pace  stopped due to  Sob sats still 95%     - 03/26/2021 no desats walking RA  Still has 02 as of 03/26/2021 not using or monitoring  Rec: Make sure you check your oxygen saturation at your highest level of activity to be sure it stays over 90% and keep track of it at least once a week, more often if breathing getting worse, and let me know if losing ground.   Each maintenance medication was reviewed in detail including emphasizing most importantly the difference between maintenance and prns and under what circumstances the prns are to be triggered using an action plan format where appropriate.  Total time for H and P, chart review, counseling, reviewing hfa/smi/neb/02  device(s) , directly observing portions of ambulatory 02 saturation study/ and generating customized AVS unique to this office visit / same day charting = 37 min

## 2021-03-27 NOTE — Assessment & Plan Note (Signed)
S/p PE Jan 2021 rx with 4  Months  DOAC - venous dopplers 02/18/2021 >>> bilateral lower extremities negative for DVT - Echo 03/26/2021 >>> ordered.  If has abn R ht function needs v/q to r/o [facility] next

## 2021-03-27 NOTE — Assessment & Plan Note (Addendum)
Quit smoking 2018 - Onset of symptoms ?  2000 ?  - 02/12/2021  After extensive coaching inhaler device,  effectiveness = 90%  Stiolto trial > improved  - Allergy profile  02/12/21  >  Eos 0.1/  IgE  376 - Spirometry 03/23/2021  FEV1 3.08 (102%)  Ratio 0.78 p 17 % improvement p saba p stiolto prior to study   - 03/26/2021   Walked on RA x one lap =  approx 250 ft -@ avg  pace stopped due to back pain and sob with sats still 92%   Improved on stiolto with less cough and great airflow at baseline but with reversiblity that suggests an asthmatic component but no clinically apparent > consider simplify rx to low does symbicort on return since this is not significant copd.

## 2021-03-29 ENCOUNTER — Other Ambulatory Visit: Payer: Self-pay

## 2021-03-29 ENCOUNTER — Ambulatory Visit (HOSPITAL_COMMUNITY)
Admission: RE | Admit: 2021-03-29 | Discharge: 2021-03-29 | Disposition: A | Discharge status: Home / Self Care | Payer: Medicare Other | Source: Ambulatory Visit | Attending: Internal Medicine | Admitting: Internal Medicine

## 2021-03-29 DIAGNOSIS — R0600 Dyspnea, unspecified: Secondary | ICD-10-CM

## 2021-03-29 DIAGNOSIS — R0609 Other forms of dyspnea: Secondary | ICD-10-CM | POA: Diagnosis not present

## 2021-03-29 LAB — ECHOCARDIOGRAM COMPLETE
Area-P 1/2: 3.45 cm2
S' Lateral: 2.8 cm

## 2021-03-29 NOTE — Progress Notes (Signed)
*  PRELIMINARY RESULTS* Echocardiogram 2D Echocardiogram has been performed.  Stacey Drain 03/29/2021, 2:36 PM

## 2021-04-01 ENCOUNTER — Encounter: Payer: Self-pay | Admitting: *Deleted

## 2021-04-05 DIAGNOSIS — R0902 Hypoxemia: Secondary | ICD-10-CM | POA: Diagnosis not present

## 2021-04-05 DIAGNOSIS — U071 COVID-19: Secondary | ICD-10-CM | POA: Diagnosis not present

## 2021-04-11 DIAGNOSIS — I10 Essential (primary) hypertension: Secondary | ICD-10-CM | POA: Diagnosis not present

## 2021-04-11 DIAGNOSIS — K2100 Gastro-esophageal reflux disease with esophagitis, without bleeding: Secondary | ICD-10-CM | POA: Diagnosis not present

## 2021-04-11 DIAGNOSIS — E1165 Type 2 diabetes mellitus with hyperglycemia: Secondary | ICD-10-CM | POA: Diagnosis not present

## 2021-04-11 DIAGNOSIS — E7800 Pure hypercholesterolemia, unspecified: Secondary | ICD-10-CM | POA: Diagnosis not present

## 2021-04-14 DIAGNOSIS — Z299 Encounter for prophylactic measures, unspecified: Secondary | ICD-10-CM | POA: Diagnosis not present

## 2021-04-14 DIAGNOSIS — H9313 Tinnitus, bilateral: Secondary | ICD-10-CM | POA: Diagnosis not present

## 2021-04-14 DIAGNOSIS — I10 Essential (primary) hypertension: Secondary | ICD-10-CM | POA: Diagnosis not present

## 2021-04-14 DIAGNOSIS — R42 Dizziness and giddiness: Secondary | ICD-10-CM | POA: Diagnosis not present

## 2021-04-21 DIAGNOSIS — Z96641 Presence of right artificial hip joint: Secondary | ICD-10-CM | POA: Diagnosis not present

## 2021-04-26 DIAGNOSIS — Z96641 Presence of right artificial hip joint: Secondary | ICD-10-CM | POA: Diagnosis not present

## 2021-04-26 DIAGNOSIS — M7061 Trochanteric bursitis, right hip: Secondary | ICD-10-CM | POA: Diagnosis not present

## 2021-04-27 DIAGNOSIS — R42 Dizziness and giddiness: Secondary | ICD-10-CM | POA: Diagnosis not present

## 2021-04-27 DIAGNOSIS — I639 Cerebral infarction, unspecified: Secondary | ICD-10-CM | POA: Diagnosis not present

## 2021-04-27 DIAGNOSIS — R519 Headache, unspecified: Secondary | ICD-10-CM | POA: Diagnosis not present

## 2021-04-27 DIAGNOSIS — I6782 Cerebral ischemia: Secondary | ICD-10-CM | POA: Diagnosis not present

## 2021-04-28 DIAGNOSIS — Z96641 Presence of right artificial hip joint: Secondary | ICD-10-CM | POA: Diagnosis not present

## 2021-04-28 DIAGNOSIS — M7061 Trochanteric bursitis, right hip: Secondary | ICD-10-CM | POA: Diagnosis not present

## 2021-04-29 DIAGNOSIS — G894 Chronic pain syndrome: Secondary | ICD-10-CM | POA: Diagnosis not present

## 2021-04-29 DIAGNOSIS — M47816 Spondylosis without myelopathy or radiculopathy, lumbar region: Secondary | ICD-10-CM | POA: Diagnosis not present

## 2021-04-29 DIAGNOSIS — M5459 Other low back pain: Secondary | ICD-10-CM | POA: Diagnosis not present

## 2021-04-29 DIAGNOSIS — M5136 Other intervertebral disc degeneration, lumbar region: Secondary | ICD-10-CM | POA: Diagnosis not present

## 2021-05-03 DIAGNOSIS — R030 Elevated blood-pressure reading, without diagnosis of hypertension: Secondary | ICD-10-CM | POA: Diagnosis not present

## 2021-05-03 DIAGNOSIS — E1165 Type 2 diabetes mellitus with hyperglycemia: Secondary | ICD-10-CM | POA: Diagnosis not present

## 2021-05-03 DIAGNOSIS — I10 Essential (primary) hypertension: Secondary | ICD-10-CM | POA: Diagnosis not present

## 2021-05-03 DIAGNOSIS — R42 Dizziness and giddiness: Secondary | ICD-10-CM | POA: Diagnosis not present

## 2021-05-03 DIAGNOSIS — Z299 Encounter for prophylactic measures, unspecified: Secondary | ICD-10-CM | POA: Diagnosis not present

## 2021-05-03 DIAGNOSIS — J449 Chronic obstructive pulmonary disease, unspecified: Secondary | ICD-10-CM | POA: Diagnosis not present

## 2021-05-03 DIAGNOSIS — Z96641 Presence of right artificial hip joint: Secondary | ICD-10-CM | POA: Diagnosis not present

## 2021-05-03 DIAGNOSIS — M7061 Trochanteric bursitis, right hip: Secondary | ICD-10-CM | POA: Diagnosis not present

## 2021-05-03 LAB — HEMOGLOBIN A1C: Hemoglobin A1C: 6.6

## 2021-05-05 DIAGNOSIS — Z96641 Presence of right artificial hip joint: Secondary | ICD-10-CM | POA: Diagnosis not present

## 2021-05-05 DIAGNOSIS — M7061 Trochanteric bursitis, right hip: Secondary | ICD-10-CM | POA: Diagnosis not present

## 2021-05-06 DIAGNOSIS — R0902 Hypoxemia: Secondary | ICD-10-CM | POA: Diagnosis not present

## 2021-05-06 DIAGNOSIS — U071 COVID-19: Secondary | ICD-10-CM | POA: Diagnosis not present

## 2021-05-10 DIAGNOSIS — M7061 Trochanteric bursitis, right hip: Secondary | ICD-10-CM | POA: Diagnosis not present

## 2021-05-10 DIAGNOSIS — Z96641 Presence of right artificial hip joint: Secondary | ICD-10-CM | POA: Diagnosis not present

## 2021-05-11 ENCOUNTER — Ambulatory Visit: Payer: Medicare Other | Admitting: Internal Medicine

## 2021-05-11 ENCOUNTER — Other Ambulatory Visit: Payer: Self-pay

## 2021-05-11 ENCOUNTER — Encounter: Payer: Self-pay | Admitting: Internal Medicine

## 2021-05-11 DIAGNOSIS — J449 Chronic obstructive pulmonary disease, unspecified: Secondary | ICD-10-CM | POA: Diagnosis not present

## 2021-05-11 DIAGNOSIS — R0600 Dyspnea, unspecified: Secondary | ICD-10-CM | POA: Diagnosis not present

## 2021-05-11 DIAGNOSIS — M4805 Spinal stenosis, thoracolumbar region: Secondary | ICD-10-CM | POA: Diagnosis not present

## 2021-05-11 DIAGNOSIS — M47816 Spondylosis without myelopathy or radiculopathy, lumbar region: Secondary | ICD-10-CM | POA: Diagnosis not present

## 2021-05-11 DIAGNOSIS — Z981 Arthrodesis status: Secondary | ICD-10-CM | POA: Diagnosis not present

## 2021-05-11 DIAGNOSIS — M4807 Spinal stenosis, lumbosacral region: Secondary | ICD-10-CM | POA: Diagnosis not present

## 2021-05-11 DIAGNOSIS — M4804 Spinal stenosis, thoracic region: Secondary | ICD-10-CM | POA: Diagnosis not present

## 2021-05-11 DIAGNOSIS — M47817 Spondylosis without myelopathy or radiculopathy, lumbosacral region: Secondary | ICD-10-CM | POA: Diagnosis not present

## 2021-05-11 DIAGNOSIS — M4714 Other spondylosis with myelopathy, thoracic region: Secondary | ICD-10-CM | POA: Diagnosis not present

## 2021-05-11 DIAGNOSIS — R0609 Other forms of dyspnea: Secondary | ICD-10-CM

## 2021-05-11 DIAGNOSIS — R937 Abnormal findings on diagnostic imaging of other parts of musculoskeletal system: Secondary | ICD-10-CM | POA: Diagnosis not present

## 2021-05-11 DIAGNOSIS — M48061 Spinal stenosis, lumbar region without neurogenic claudication: Secondary | ICD-10-CM | POA: Diagnosis not present

## 2021-05-11 DIAGNOSIS — M5104 Intervertebral disc disorders with myelopathy, thoracic region: Secondary | ICD-10-CM | POA: Diagnosis not present

## 2021-05-11 MED ORDER — BUDESONIDE-FORMOTEROL FUMARATE 80-4.5 MCG/ACT IN AERO: INHALATION_SPRAY | RESPIRATORY_TRACT | 12 refills | Status: DC

## 2021-05-11 MED ORDER — PREDNISONE 10 MG PO TABS: ORAL_TABLET | ORAL | 0 refills | Status: DC

## 2021-05-11 NOTE — Assessment & Plan Note (Signed)
Quit smoking 2018 - Onset of symptoms ?  2000 ?  - 02/12/2021  After extensive coaching inhaler device,  effectiveness = 90%  Stiolto trial > improved  - Allergy profile  02/12/21  >  Eos 0.1/  IgE  376 - Spirometry 03/23/2021  FEV1 3.08 (102%)  Ratio 0.78 p 17 % improvement p saba p stiolto prior to study   - 03/26/2021   Walked on RA x one lap =  approx 150 ft -@ avg  pace stopped due to back pain and sob with sats still 92%   He has not been challenged with prednisone and only on stiolto which he is not convinced has helped.  He has also not rechallenged with saba  Re saba: I spent extra time with pt today reviewing appropriate use of albuterol for prn use on exertion with the following points: 1) saba is for relief of sob that does not improve by walking a slower pace or resting but rather if the pt does not improve after trying this first. 2) If the pt is convinced, as many are, that saba helps recover from activity faster then it's easy to tell if this is the case by re-challenging : ie stop, take the inhaler, then p 5 minutes try the exact same activity (intensity of workload) that just caused the symptoms and see if they are substantially diminished or not after saba 3) if there is an activity that reproducibly causes the symptoms, try the saba 15 min before the activity on alternate days   If in fact the saba really does help, then fine to continue to use it prn but advised may need to look closer at the maintenance regimen being used to achieve better control of airways disease with exertion.    Each maintenance medication was reviewed in detail including emphasizing most importantly the difference between maintenance and prns and under what circumstances the prns are to be triggered using an action plan format where appropriate.  Total time for H and P, chart review, counseling, reviewing hfa device(s) , directly observing portions of ambulatory 02 saturation study/ and generating  customized AVS unique to this office visit / same day charting > 30 min

## 2021-05-11 NOTE — Patient Instructions (Addendum)
Prednisone 10 mg take  4 each am x 2 days,   2 each am x 2 days,  1 each am x 2 days and stop   If not better after the prednisone, stop the stiolto to see what difference   If a lot better after the prednisone start symbicort 80  Take 2 puffs first thing in am and then another 2 puffs about 12 hours later.   We will schedule you for V/q lung scan

## 2021-05-11 NOTE — Progress Notes (Signed)
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     History of Present Illness  02/12/2021  Pulmonary/ 1st office eval/ Patrick Aguilar / [facility] re doe/ocugh  Chief Complaint  Patient presents with   Consult    Shortness of breath with activity  Dyspnea: push buggy one aisle and has to stop off 02  Walks around house easier while  on 3lpm  Cough: clear mucus x one hour  Sleep: on side / two pillows  SABA use: saba  02 3lpm POC  Rec Plan A = Automatic = Always=    stiolto 2 puffs each AM  Plan B = Backup (to supplement plan A, not to replace it) Only use your albuterol inhaler as a rescue medication Plan C = Crisis (instead of Plan B but only if Plan B stops working) - only use your albuterol nebulizer if you first try Plan B and it fails to help   Ok to try Try albuterol 15 min before an activity (on alternating days)  that you know would make you short of breath a Make sure you check your oxygen saturation  at your highest level of activity  to be sure it stays over 90%   Please schedule a follow up office visit in 6 weeks  with pfts on return  Add venous dopplers negative for DVT   03/26/2021  f/u ov/Patrick Aguilar re: GOLD 0  doe/ uacs maint on stiolto  Chief Complaint  Patient presents with   Follow-up    Pt states hard to say if his breathing has improved b/c he has been inactive due to hot weather. He has not had to use his rescue inhaler or neb.    Dyspnea:  not really very active / does  fine  ac and more limited by back  Cough: none now  Sleeping: on side 2 pillows  SABA use: none  02: still has not using, not monitoring  Covid status: vax x 3  Rec We will to schedule you an echocardiogram  > wnl with nl LA and RV  Stay on Stiolto 2 pffs each am   Ok to Try albuterol 15 min before an activity  To get the most out of exercise, you need to be continuously aware that you are short of breath, but never out of breath, for at least 30 minutes daily.  Make sure you check your oxygen saturations at highest level of activity    Please schedule a follow up office visit in 6 weeks, call sooner if needed   Add:  consider change to symbicort 80 next ov    05/11/2021  f/u ov/[place] office/Patrick Aguilar re: uacs with elevated IgE, doe with nl spirometry maint on stiolto 2 puffs not sure helping ./ did not follow instructions re try albuterol    Chief Complaint  Patient presents with   Follow-up    Breathing is unchanged since the  last visit. He has used albuterol inhaler a few times since the last visit. He rarely uses neb.   Dyspnea:  limited by breathing and back about / [employer] shopping ok leaning on the cart / HC parking  Cough: none  Sleeping: no resp cc/ able to lie flat bed on side  SABA use: as above, never pre treats or rechallenges as rec  02: has concentrator not using  Covid status: vax x 3 / original covid infection never right since      No obvious day to day or daytime variability or assoc excess/ purulent sputum or mucus plugs or hemoptysis or cp or chest tightness, subjective wheeze or overt sinus or hb symptoms.   Sleeping  without nocturnal  or early am exacerbation  of respiratory  c/o's or need for noct saba. Also denies any obvious fluctuation of symptoms with weather or environmental changes or other aggravating or alleviating factors except as outlined above   No unusual exposure hx or h/o childhood pna/ asthma or knowledge of premature birth.  Current  Allergies, Complete Past Medical History, Past Surgical History, Family History, and Social History were reviewed in [employer] record.  ROS  The following are not active complaints unless bolded Hoarseness, sore throat, dysphagia, dental problems, itching, sneezing,  nasal congestion or discharge of excess mucus or purulent secretions, ear ache,   fever, chills, sweats, unintended wt loss or wt gain, classically pleuritic or exertional cp,  orthopnea pnd or arm/hand swelling  or leg swelling, presyncope, palpitations, abdominal pain, anorexia, nausea, vomiting, diarrhea  or change in bowel habits or change in bladder habits, change in stools or change in urine, dysuria, hematuria,  rash, arthralgias, visual complaints, headache, numbness, weakness or ataxia or problems with walking or coordination,  change in mood or  memory.        Current Meds  Medication Sig   albuterol (PROVENTIL) (2.5 MG/3ML) 0.083% nebulizer solution Inhale 2.5 mg into the lungs every 6 (six) hours as needed for shortness of breath or wheezing.   albuterol (VENTOLIN HFA) 108 (90 Base) MCG/ACT inhaler Inhale 2 puffs into the lungs every 6 (six) hours as needed for wheezing or shortness of breath.   aspirin EC 81 MG tablet Take 81 mg by mouth daily. Swallow whole.   atenolol-chlorthalidone (TENORETIC) 50-25 MG tablet Take 1 tablet by mouth daily with breakfast.    blood glucose meter kit and supplies KIT Dispense based on patient and insurance preference. Use up to four times daily as directed. (FOR ICD-9 250.00, 250.01).   diclofenac (VOLTAREN) 75 MG EC tablet Take 75 mg by mouth 2 (two) times daily.   doxazosin (CARDURA) 8 MG tablet Take 8 mg by mouth daily with breakfast.    famotidine (PEPCID) 20 MG tablet Take 20 mg by mouth 2 (two) times daily.   furosemide (LASIX) 20 MG tablet Take 20 mg by mouth daily after breakfast.    gabapentin (NEURONTIN) 800 MG tablet Take 800 mg by mouth every 6 (six)  hours.    metFORMIN (GLUCOPHAGE) 1000 MG tablet Take 500 mg by mouth daily after breakfast.   oxyCODONE-acetaminophen (PERCOCET) 10-325 MG tablet Take 1 tablet by mouth every 6 (six) hours.   pantoprazole (PROTONIX) 40 MG tablet Take 40 mg by mouth daily.   polyethylene glycol (MIRALAX / GLYCOLAX) packet Take 17 g by mouth daily as needed for mild constipation. (Patient taking differently: Take 17 g by mouth daily after breakfast.)   potassium chloride  SA (K-DUR,KLOR-CON) 20 MEQ tablet Take 40 mEq by mouth daily after breakfast.    rosuvastatin (CRESTOR) 5 MG tablet Take 5 mg by mouth every Monday.   Tiotropium Bromide-Olodaterol (STIOLTO RESPIMAT) 2.5-2.5 MCG/ACT AERS Inhale 2 puffs into the lungs daily.   traZODone (DESYREL) 100 MG tablet Take 100 mg by mouth at bedtime.          Past Medical History:  Diagnosis Date   AKI (acute kidney injury) ([place]) 12/2017   Anxiety    Arthritis    Complication of anesthesia    low respirations, low BP   COPD (chronic obstructive pulmonary disease) (HCC)    DDD (degenerative disc disease), lumbar    Diabetes mellitus without complication (HCC)    type 2   Diastolic dysfunction 54/49/2010   Moderate noted on ECHO   Fournier's gangrene in male    GERD (gastroesophageal reflux disease)    History of ARDS    History of necrotizing fasciitis    Severe   Hypertension    Lumbar spondylosis    Numbness and tingling of both upper extremities    Pulmonary hypertension ([place]) 12/14/2017   Mild, noted on ECHO   Respiratory failure, acute ([place]) 12/2017   Septic shock ([place]) 12/2017   Shortness of breath dyspnea    Testicular pain, left    Wears glasses        Objective:     05/11/2021       190    03/26/21 196 lb (88.9 kg)  02/12/21 189 lb 9.6 oz (86 kg)  11/16/20 196 lb 6.4 oz (89.1 kg)    Vital signs reviewed  05/11/2021  - Note at rest 02 sats  95% on RA   General appearance:    somber amb wm/ hopeless affect  nad   HEENT : pt wearing  mask not removed for exam due to covid -19 concerns.    NECK :  without JVD/Nodes/TM/ nl carotid upstrokes bilaterally   LUNGS: no acc muscle use,  Nl contour chest which is clear to A and P bilaterally without cough on insp or exp maneuvers   CV:  RRR  no s3 or murmur or increase in P2, and no edema   ABD:  Mod obese soft and nontender with nl inspiratory excursion in the supine position. No bruits or organomegaly appreciated, bowel sounds nl  MS:  walks with cane/ ext warm without deformities, calf tenderness, cyanosis or clubbing No obvious joint restrictions   SKIN: warm and dry without lesions    NEURO:  alert, approp, nl sensorium with  no motor or cerebellar deficits apparent.              Assessment

## 2021-05-11 NOTE — Assessment & Plan Note (Signed)
S/p PE Jan 2021 rx with 4  Months  DOAC - venous dopplers 02/18/2021 >>> bilateral lower extremities negative for DVT - Echo  03/29/21  Nl  LV, nl LA, nl RV s PH  - 05/11/2021   Walked on RA x    approx 100 @ mod  pace, stopped due to back pain and sob  with lowest 02 sats 97%  - V/q ordered  Occult PE is unlikely without PH as cause for sob but he is convinced this is all tied back to his PE so V/Q next step  - doubt he can tol CPST

## 2021-05-12 ENCOUNTER — Telehealth: Payer: Self-pay | Admitting: Internal Medicine

## 2021-05-12 DIAGNOSIS — E7800 Pure hypercholesterolemia, unspecified: Secondary | ICD-10-CM | POA: Diagnosis not present

## 2021-05-12 DIAGNOSIS — E1165 Type 2 diabetes mellitus with hyperglycemia: Secondary | ICD-10-CM | POA: Diagnosis not present

## 2021-05-12 DIAGNOSIS — K2100 Gastro-esophageal reflux disease with esophagitis, without bleeding: Secondary | ICD-10-CM | POA: Diagnosis not present

## 2021-05-12 DIAGNOSIS — I10 Essential (primary) hypertension: Secondary | ICD-10-CM | POA: Diagnosis not present

## 2021-05-12 DIAGNOSIS — M7061 Trochanteric bursitis, right hip: Secondary | ICD-10-CM | POA: Diagnosis not present

## 2021-05-12 DIAGNOSIS — Z96641 Presence of right artificial hip joint: Secondary | ICD-10-CM | POA: Diagnosis not present

## 2021-05-12 NOTE — Telephone Encounter (Signed)
Spoke to [email or web address]  [email or web address]  [facility] pt will have to be moved to another day precert in in medd review qith [employer] 

## 2021-05-13 ENCOUNTER — Encounter (HOSPITAL_COMMUNITY): Payer: Medicare Other

## 2021-05-13 DIAGNOSIS — E119 Type 2 diabetes mellitus without complications: Secondary | ICD-10-CM | POA: Diagnosis not present

## 2021-05-14 LAB — LIPID PANEL
Chol/HDL Ratio: 3.4 ratio (ref 0.0–5.0)
Cholesterol, Total: 145 mg/dL (ref 100–199)
HDL: 43 mg/dL (ref >39)
LDL Chol Calc (NIH): 72 mg/dL (ref 0–99)
Triglycerides: 177 mg/dL — ABNORMAL HIGH (ref 0–149)
VLDL Cholesterol Cal: 30 mg/dL (ref 5–40)

## 2021-05-14 LAB — COMPREHENSIVE METABOLIC PANEL
ALT: 34 IU/L (ref 0–44)
AST: 24 IU/L (ref 0–40)
Albumin/Globulin Ratio: 2.1 (ref 1.2–2.2)
Albumin: 4.4 g/dL (ref 3.8–4.8)
Alkaline Phosphatase: 88 IU/L (ref 44–121)
BUN/Creatinine Ratio: 20 (ref 10–24)
BUN: 29 mg/dL — ABNORMAL HIGH (ref 8–27)
Bilirubin Total: 0.4 mg/dL (ref 0.0–1.2)
CO2: 24 mmol/L (ref 20–29)
Calcium: 9.6 mg/dL (ref 8.6–10.2)
Chloride: 98 mmol/L (ref 96–106)
Creatinine, Ser: 1.44 mg/dL — ABNORMAL HIGH (ref 0.76–1.27)
Globulin, Total: 2.1 g/dL (ref 1.5–4.5)
Glucose: 126 mg/dL — ABNORMAL HIGH (ref 65–99)
Potassium: 4.2 mmol/L (ref 3.5–5.2)
Sodium: 143 mmol/L (ref 134–144)
Total Protein: 6.5 g/dL (ref 6.0–8.5)
eGFR: 52 mL/min/{1.73_m2} — ABNORMAL LOW (ref >59)

## 2021-05-14 LAB — T4, FREE: Free T4: 1.41 ng/dL (ref 0.82–1.77)

## 2021-05-14 LAB — TSH: TSH: 2.36 u[IU]/mL (ref 0.450–4.500)

## 2021-05-18 ENCOUNTER — Other Ambulatory Visit: Payer: Self-pay

## 2021-05-18 ENCOUNTER — Ambulatory Visit (HOSPITAL_COMMUNITY)
Admission: RE | Admit: 2021-05-18 | Discharge: 2021-05-18 | Disposition: A | Discharge status: Home / Self Care | Payer: Medicare Other | Source: Ambulatory Visit | Attending: Internal Medicine | Admitting: Internal Medicine

## 2021-05-18 ENCOUNTER — Encounter (HOSPITAL_COMMUNITY)
Admission: RE | Admit: 2021-05-18 | Discharge: 2021-05-18 | Disposition: A | Discharge status: Home / Self Care | Payer: Medicare Other | Source: Ambulatory Visit | Attending: Internal Medicine | Admitting: Internal Medicine

## 2021-05-18 DIAGNOSIS — J449 Chronic obstructive pulmonary disease, unspecified: Secondary | ICD-10-CM | POA: Diagnosis not present

## 2021-05-18 DIAGNOSIS — M4804 Spinal stenosis, thoracic region: Secondary | ICD-10-CM | POA: Diagnosis not present

## 2021-05-18 DIAGNOSIS — M48062 Spinal stenosis, lumbar region with neurogenic claudication: Secondary | ICD-10-CM | POA: Diagnosis not present

## 2021-05-18 DIAGNOSIS — I10 Essential (primary) hypertension: Secondary | ICD-10-CM | POA: Diagnosis not present

## 2021-05-18 DIAGNOSIS — R0600 Dyspnea, unspecified: Secondary | ICD-10-CM | POA: Insufficient documentation

## 2021-05-18 DIAGNOSIS — M5136 Other intervertebral disc degeneration, lumbar region: Secondary | ICD-10-CM | POA: Diagnosis not present

## 2021-05-18 DIAGNOSIS — R0609 Other forms of dyspnea: Secondary | ICD-10-CM

## 2021-05-18 DIAGNOSIS — M4714 Other spondylosis with myelopathy, thoracic region: Secondary | ICD-10-CM | POA: Diagnosis not present

## 2021-05-18 DIAGNOSIS — M47816 Spondylosis without myelopathy or radiculopathy, lumbar region: Secondary | ICD-10-CM | POA: Diagnosis not present

## 2021-05-18 MED ORDER — TECHNETIUM TO 99M ALBUMIN AGGREGATED
3.8000 | Freq: Once | INTRAVENOUS | Status: AC | PRN
  Administered 2021-05-18: 3.8 via INTRAVENOUS

## 2021-05-19 ENCOUNTER — Encounter: Payer: Self-pay | Admitting: "Endocrinology

## 2021-05-19 ENCOUNTER — Ambulatory Visit: Payer: Medicare Other | Admitting: "Endocrinology

## 2021-05-19 VITALS — BP 112/66 | HR 72 | Ht 68.0 in | Wt 189.8 lb

## 2021-05-19 DIAGNOSIS — E1122 Type 2 diabetes mellitus with diabetic chronic kidney disease: Secondary | ICD-10-CM | POA: Diagnosis not present

## 2021-05-19 DIAGNOSIS — I10 Essential (primary) hypertension: Secondary | ICD-10-CM | POA: Diagnosis not present

## 2021-05-19 DIAGNOSIS — E782 Mixed hyperlipidemia: Secondary | ICD-10-CM

## 2021-05-19 DIAGNOSIS — Z96641 Presence of right artificial hip joint: Secondary | ICD-10-CM | POA: Diagnosis not present

## 2021-05-19 DIAGNOSIS — N182 Chronic kidney disease, stage 2 (mild): Secondary | ICD-10-CM | POA: Diagnosis not present

## 2021-05-19 DIAGNOSIS — M7061 Trochanteric bursitis, right hip: Secondary | ICD-10-CM | POA: Diagnosis not present

## 2021-05-19 NOTE — Progress Notes (Signed)
05/19/2021, 1:47 PM  Endocrinology follow-up note  Subjective:    Patient ID: Patrick Aguilar, male    DOB: 03-03-1951.  Patrick Aguilar is being seen in follow-up after he was seen in consultation for management of currently controlled asymptomatic diabetes requested by  Monico Blitz, MD.   Past Medical History:  Diagnosis Date   AKI (acute kidney injury) ([place]) 12/2017   Anxiety    Arthritis    Complication of anesthesia    low respirations, low BP   COPD (chronic obstructive pulmonary disease) ([place])    DDD (degenerative disc disease), lumbar    Diabetes mellitus without complication ([place])    type 2   Diastolic dysfunction 63/84/5364   Moderate noted on ECHO   Fournier's gangrene in male    GERD (gastroesophageal reflux disease)    History of ARDS    History of necrotizing fasciitis    Severe   Hypertension    Lumbar spondylosis    Numbness and tingling of both upper extremities    Pulmonary hypertension ([place]) 12/14/2017   Mild, noted on ECHO   Respiratory failure, acute ([place]) 12/2017   Septic shock ([place]) 12/2017   Shortness of breath dyspnea    Testicular pain, left    Wears glasses     Past Surgical History:  Procedure Laterality Date   APPENDECTOMY     APPLICATION OF A-CELL OF CHEST/ABDOMEN N/A 01/08/2018   Procedure: APPLICATION OF A-CELL OF GROIN;  Surgeon: Wallace Going, DO;  Location: [facility];  Service: Plastics;  Laterality: N/A;   APPLICATION OF A-CELL OF EXTREMITY N/A 12/25/2017   Procedure: APPLICATION OF A-CELL;  Surgeon: Wallace Going, DO;  Location: [facility];  Service: Plastics;  Laterality: N/A;   BACK SURGERY     x5   CARDIAC CATHETERIZATION  06   neg   CERVICAL DISC SURGERY     x2   COLONOSCOPY     DEBRIDEMENT AND CLOSURE WOUND N/A 01/26/2018   Procedure: REVISION OF PERINEUM WOUND WITH DEBRIDEMENT, PARTIAL CLOSURE OF PERINEUM, PLACEMENT OF [place];  Surgeon: Wallace Going, DO;  [facility];  Service: Plastics;  Laterality: N/A;   DENTAL SURGERY     teeth extractions   groin wound  01/08/2018   : EXCISION OF GROIN WOUND WITH PLACEMENT OF ACELL, AND PRIMARY WOUND CLOSURE (N/A Scrotum)   I & D EXTREMITY N/A 03/12/2018   Procedure: IRRIGATION AND DEBRIDEMENT PERIMUM WOUND WITH CLOSURE;  Surgeon: Wallace Going, DO;  Location: [facility];  Service: Plastics;  Laterality: N/A;   INCISION AND DRAINAGE ABSCESS Left 07/11/2018   Procedure: INCISION AND DRAINAGE ABSCESS LEFT THIGH;  Surgeon: Franchot Gallo, MD;  [facility];  Service: Urology;  Laterality: Left;   INCISION AND DRAINAGE OF WOUND N/A 12/25/2017   Procedure: Irrigation and debridement of Fournier's of scrotum with placement of testes in subcutaneous thigh pockets and Acell placement;  Surgeon: Wallace Going, DO;  [facility];  Service: Plastics;  Laterality: N/A;   INCISION AND DRAINAGE OF WOUND N/A 01/08/2018   Procedure: EXCISION OF GROIN WOUND WITH PLACEMENT OF ACELL, AND PRIMARY WOUND CLOSURE;  Surgeon: Wallace Going, DO;  Location: [facility];  Service: Plastics;  Laterality: N/A;   ORCHIECTOMY N/A 12/13/2017   Procedure: EXCISION OF SCROTUM AND DEBRIDEMENT OF PENIS;  Surgeon: Franchot Gallo, MD;  [facility];  Service: Urology;  Laterality: N/A;   ORCHIECTOMY Left 06/22/2018  Procedure: ORCHIECTOMY;  Surgeon: Franchot Gallo, MD;  Location: [facility];  Service: Urology;  Laterality: Left;   PLANTAR FASCIA SURGERY Bilateral    shoulders Bilateral    rotator cuff   SUBMANDIBULAR GLAND EXCISION Left 03/12/2015   Procedure: LEFT SUBMANDIBULAR GLAND RESECTION;  Surgeon: Melida Quitter, MD;  Location: [facility];  Service: ENT;  Laterality: Left;   TONSILLECTOMY     TOTAL HIP ARTHROPLASTY Right 03/06/2019   Procedure: TOTAL HIP ARTHROPLASTY ANTERIOR APPROACH;  Surgeon: Gaynelle Arabian, MD;  [facility];  Service: Orthopedics;  Laterality: Right;  143mn    Social History    Socioeconomic History   Marital status: Single    Spouse name: Not on file   Number of children: Not on file   Years of education: Not on file   Highest education level: Not on file  Occupational History   Not on file  Tobacco Use   Smoking status: Former    Packs/day: 1.00    Years: 50.00    Pack years: 50.00    Types: Cigarettes    Quit date: 12/13/2016    Years since quitting: 4.4   Smokeless tobacco: Never  Vaping Use   Vaping Use: Never used  Substance and Sexual Activity   Alcohol use: No    Comment: quit alcohol 80's   Drug use: No   Sexual activity: Not on file  Other Topics Concern   Not on file  Social History Narrative   Not on file   Social Determinants of Health   Financial Resource Strain: Not on file  Food Insecurity: Not on file  Transportation Needs: Not on file  Physical Activity: Not on file  Stress: Not on file  Social Connections: Not on file    Family History  Problem Relation Age of Onset   Lung cancer Mother    Bladder Cancer Father     Outpatient Encounter Medications as of 05/19/2021  Medication Sig   metFORMIN (GLUCOPHAGE) 500 MG tablet Take 500 mg by mouth once.   albuterol (PROVENTIL) (2.5 MG/3ML) 0.083% nebulizer solution Inhale 2.5 mg into the lungs every 6 (six) hours as needed for shortness of breath or wheezing.   albuterol (VENTOLIN HFA) 108 (90 Base) MCG/ACT inhaler Inhale 2 puffs into the lungs every 6 (six) hours as needed for wheezing or shortness of breath.   aspirin EC 81 MG tablet Take 81 mg by mouth daily. Swallow whole.   atenolol-chlorthalidone (TENORETIC) 50-25 MG tablet Take 1 tablet by mouth daily with breakfast.    blood glucose meter kit and supplies KIT Dispense based on patient and insurance preference. Use up to four times daily as directed. (FOR ICD-9 250.00, 250.01).   budesonide-formoterol (SYMBICORT) 80-4.5 MCG/ACT inhaler Take 2 puffs first thing in am and then another 2 puffs about 12 hours later.    diclofenac (VOLTAREN) 75 MG EC tablet Take 75 mg by mouth 2 (two) times daily.   doxazosin (CARDURA) 8 MG tablet Take 8 mg by mouth daily with breakfast.    famotidine (PEPCID) 20 MG tablet Take 20 mg by mouth 2 (two) times daily.   furosemide (LASIX) 20 MG tablet Take 20 mg by mouth daily after breakfast.    gabapentin (NEURONTIN) 800 MG tablet Take 800 mg by mouth every 6 (six) hours.    oxyCODONE-acetaminophen (PERCOCET) 10-325 MG tablet Take 1 tablet by mouth every 6 (six) hours.   pantoprazole (PROTONIX) 40 MG tablet Take 40 mg by mouth daily.  polyethylene glycol (MIRALAX / GLYCOLAX) packet Take 17 g by mouth daily as needed for mild constipation. (Patient taking differently: Take 17 g by mouth daily after breakfast.)   potassium chloride SA (K-DUR,KLOR-CON) 20 MEQ tablet Take 40 mEq by mouth daily after breakfast.    predniSONE (DELTASONE) 10 MG tablet Take  4 each am x 2 days,   2 each am x 2 days,  1 each am x 2 days and stop   rosuvastatin (CRESTOR) 5 MG tablet Take 5 mg by mouth every Monday.   Tiotropium Bromide-Olodaterol (STIOLTO RESPIMAT) 2.5-2.5 MCG/ACT AERS Inhale 2 puffs into the lungs daily.   traZODone (DESYREL) 100 MG tablet Take 100 mg by mouth at bedtime.   [DISCONTINUED] metFORMIN (GLUCOPHAGE) 1000 MG tablet Take 500 mg by mouth 2 (two) times daily with a meal.   No facility-administered encounter medications on file as of 05/19/2021.    ALLERGIES: Allergies  Allergen Reactions   Tramadol Other (See Comments)    tremor   Adenosine     can't tolerate  05/2005   Bupropion     Nausea   Morphine And Related Itching    VACCINATION STATUS: Immunization History  Administered Date(s) Administered   Td,absorbed, Preservative Free, Adult Use, Lf Unspecified 03/05/2007   Tdap 08/10/2017   Zoster Recombinat (Shingrix) 08/10/2017    Diabetes He presents for his follow-up diabetic visit. He has type 2 diabetes mellitus. Onset time: She was diagnosed at approximate age  of 76 years. His disease course has been stable. There are no hypoglycemic associated symptoms. Pertinent negatives for hypoglycemia include no confusion, headaches, pallor or seizures. There are no diabetic associated symptoms. Pertinent negatives for diabetes include no chest pain, no fatigue, no polydipsia, no polyphagia, no polyuria and no weakness. There are no hypoglycemic complications. Symptoms are stable. Diabetic complications include nephropathy. Risk factors for coronary artery disease include diabetes mellitus, dyslipidemia, hypertension, male sex and sedentary lifestyle. Current diabetic treatments: Is currently on Metformin 1000 mg p.o. twice daily.  He wants to come off of metformin altogether. His weight is decreasing steadily. He is following a generally unhealthy diet. When asked about meal planning, he reported none. He has not had a previous visit with a dietitian. He participates in exercise intermittently. There is no change in his home blood glucose trend. (His recent labs show A1c of 6.6%, unchanged from his previous measurement.  He denies hypoglycemia.  He has increased his metformin to 1000 mg p.o. twice daily by itself.       ) An ACE inhibitor/angiotensin II receptor blocker is being taken.  Hyperlipidemia This is a chronic problem. The current episode started more than 1 year ago. Exacerbating diseases include diabetes. Pertinent negatives include no chest pain, myalgias or shortness of breath. Risk factors for coronary artery disease include diabetes mellitus, dyslipidemia, male sex, hypertension and a sedentary lifestyle.  Hypertension This is a chronic problem. The current episode started more than 1 year ago. The problem is controlled. Pertinent negatives include no chest pain, headaches, neck pain, palpitations or shortness of breath. Risk factors for coronary artery disease include diabetes mellitus, dyslipidemia, sedentary lifestyle and smoking/tobacco exposure. Past  treatments include beta blockers and diuretics. Hypertensive end-organ damage includes heart failure.    Review of Systems  Constitutional:  Negative for chills, fatigue, fever and unexpected weight change.  HENT:  Negative for dental problem, mouth sores and trouble swallowing.   Eyes:  Negative for visual disturbance.  Respiratory:  Negative for cough, choking,  chest tightness, shortness of breath and wheezing.   Cardiovascular:  Negative for chest pain, palpitations and leg swelling.  Gastrointestinal:  Negative for abdominal distention, abdominal pain, constipation, diarrhea, nausea and vomiting.  Endocrine: Negative for polydipsia, polyphagia and polyuria.  Genitourinary:  Negative for dysuria, flank pain, hematuria and urgency.  Musculoskeletal:  Positive for gait problem. Negative for back pain, myalgias and neck pain.       He walks with a cane due to back problem giving him disequilibrium.  Skin:  Negative for pallor, rash and wound.  Neurological:  Negative for seizures, syncope, weakness, numbness and headaches.  Psychiatric/Behavioral:  Negative for confusion and dysphoric mood.    Objective:    Vitals with BMI 05/19/2021 05/11/2021 03/26/2021  Height _0  _1  _2   Weight 189 lbs 13 oz 190 lbs 196 lbs  BMI 28.87 16.9 67.89  Systolic 381 017 510  Diastolic 66 70 66  Pulse 72 76 63    BP 112/66   Pulse 72   Ht _3  (1.727 m)   Wt 189 lb 12.8 oz (86.1 kg)   BMI 28.86 kg/m   Wt Readings from Last 3 Encounters:  05/19/21 189 lb 12.8 oz (86.1 kg)  05/11/21 190 lb (86.2 kg)  03/26/21 196 lb (88.9 kg)     Physical Exam Constitutional:      General: He is not in acute distress.    Appearance: He is well-developed.  HENT:     Head: Normocephalic and atraumatic.  Neck:     Thyroid: No thyromegaly.     Trachea: No tracheal deviation.  Cardiovascular:     Rate and Rhythm: Normal rate.     Pulses:          Dorsalis pedis pulses are 1+ on the right side and 1+ on  the left side.       Posterior tibial pulses are 1+ on the right side and 1+ on the left side.     Heart sounds: S1 normal and S2 normal. No murmur heard.   No gallop.  Pulmonary:     Effort: Pulmonary effort is normal. No respiratory distress.     Breath sounds: No wheezing.  Abdominal:     General: There is no distension.     Tenderness: There is no abdominal tenderness. There is no guarding.  Musculoskeletal:     Right shoulder: No swelling or deformity.     Cervical back: Normal range of motion and neck supple.  Skin:    General: Skin is warm and dry.     Findings: No rash.     Nails: There is no clubbing.  Neurological:     Mental Status: He is alert and oriented to person, place, and time.     Cranial Nerves: No cranial nerve deficit.     Sensory: No sensory deficit.     Gait: Gait normal.     Deep Tendon Reflexes: Reflexes are normal and symmetric.  Psychiatric:        Speech: Speech normal.        Behavior: Behavior normal. Behavior is cooperative.        Thought Content: Thought content normal.        Judgment: Judgment normal.    CMP ( most recent) CMP     Component Value Date/Time   NA 143 05/13/2021 1129   K 4.2 05/13/2021 1129   CL 98 05/13/2021 1129   CO2 24 05/13/2021 1129   GLUCOSE 126 (  H) 05/13/2021 1129   GLUCOSE 212 (H) 02/12/2021 1109   BUN 29 (H) 05/13/2021 1129   CREATININE 1.44 (H) 05/13/2021 1129   CREATININE 1.13 05/13/2020 0000   CALCIUM 9.6 05/13/2021 1129   PROT 6.5 05/13/2021 1129   ALBUMIN 4.4 05/13/2021 1129   AST 24 05/13/2021 1129   ALT 34 05/13/2021 1129   ALKPHOS 88 05/13/2021 1129   BILITOT 0.4 05/13/2021 1129   GFRNONAA >60 02/12/2021 1109   GFRNONAA 66 05/13/2020 0000   GFRAA 76 05/13/2020 0000     Diabetic Labs (most recent): Lab Results  Component Value Date   HGBA1C 6.6 05/03/2021   HGBA1C 6.6 11/16/2020   HGBA1C 6.0 04/21/2020     Lipid Panel ( most recent) Lipid Panel     Component Value Date/Time   CHOL  145 05/13/2021 1129   TRIG 177 (H) 05/13/2021 1129   HDL 43 05/13/2021 1129   CHOLHDL 3.4 05/13/2021 1129   LDLCALC 72 05/13/2021 1129   LABVLDL 30 05/13/2021 1129     Assessment & Plan:   1. Type 2 diabetes mellitus without complication, without long-term current use of insulin (HCC)  - Patrick Aguilar has currently controlled asymptomatic type 2 DM since  70 years of age.  His recent labs show A1c of 6.6%, unchanged from his previous measurement.  He denies hypoglycemia.  He has increased his metformin to 1000 mg p.o. twice daily by itself.    - I had a long discussion with him about the progressive nature of diabetes and the pathology behind its complications. -his diabetes is complicated by diastolic dysfunction and he remains at a high risk for more acute and chronic complications which include CAD, CVA, CKD, retinopathy, and neuropathy. These are all discussed in detail with him.  - I have counseled him on diet  and weight management  by adopting a carbohydrate restricted/protein rich diet. Patient is encouraged to switch to  unprocessed or minimally processed     complex starch and increased protein intake (animal or plant source), fruits, and vegetables. -  he is advised to stick to a routine mealtimes to eat 3 meals  a day and avoid unnecessary snacks ( to snack only to correct hypoglycemia).   - he acknowledges that there is a room for improvement in his food and drink choices. - Suggestion is made for him to avoid simple carbohydrates  from his diet including Cakes, Sweet Desserts, Ice Cream, Soda (diet and regular), Sweet Tea, Candies, Chips, Cookies, Store Bought Juices, Alcohol in Excess of  1-2 drinks a day, Artificial Sweeteners,  Coffee Creamer, and "Sugar-free" Products, Lemonade. This will help patient to have more stable blood glucose profile and potentially avoid unintended weight gain.  - he will be scheduled with Jearld Fenton, RDN, CDE for diabetes education.  - I  have approached him with the following individualized plan to manage  his diabetes and patient agrees:   -He is advised to lower metformin to 500 mg p.o. daily at breakfast, in light of his recent labs showing CKD.     -He has several choices for treating diabetes if Metformin is not enough.  He is not a candidate for SGLT2 inhibitors due to his prior history of Fournier's gangrene. -Given his hypertriglyceridemia, a low-dose basal insulin will be the best choice if it is necessary to switch.   - Specific targets for  A1c;  LDL, HDL,  and Triglycerides were discussed with the patient.  2) Blood Pressure /Hypertension:  -  His blood pressure is controlled to target.   He is advised to discontinue amlodipine.  He is advised to continue  atenolol/HCTZ mg p.o. daily with breakfast .  3) Lipids/Hyperlipidemia:   Review of his recent lipid panel showed improving hypertriglyceridemia at 177 improving from 259.  His LDL is 72.  He is advised to continue low-dose Crestor 5 mg p.o. nightly.   Side effects and precautions discussed with him.  4)  Weight/Diet:  Body mass index is 28.86 kg/m.  -He would benefit from some weight loss.   I discussed with him the fact that loss of 5 - 10% of his  current body weight will have the most impact on his diabetes management.  Exercise, and detailed carbohydrates information provided  -  detailed on discharge instructions.  5) Chronic Care/Health Maintenance:  -he  is on Statin medications and  is encouraged to initiate and continue to follow up with Ophthalmology, Dentist,  Podiatrist at least yearly or according to recommendations, and advised to   stay away from smoking. I have recommended yearly flu vaccine and pneumonia vaccine at least every 5 years; moderate intensity exercise for up to 150 minutes weekly; and  sleep for at least 7 hours a day.  - he is  advised to maintain close follow up with Monico Blitz, MD for primary care needs, as well as his other  providers for optimal and coordinated care.   I spent 32 minutes in the care of the patient today including review of labs from [facility], Lipids, Thyroid Function, Hematology (current and previous including abstractions from other facilities); face-to-face time discussing  his blood glucose readings/logs, discussing hypoglycemia and hyperglycemia episodes and symptoms, medications doses, his options of short and long term treatment based on the latest standards of care / guidelines;  discussion about incorporating lifestyle medicine;  and documenting the encounter.    Please refer to Patient Instructions for Blood Glucose Monitoring and Insulin/Medications Dosing Guide"  in media tab for additional information. Please  also refer to " Patient Self Inventory" in the Media  tab for reviewed elements of pertinent patient history.  Cherylynn Ridges participated in the discussions, expressed understanding, and voiced agreement with the above plans.  All questions were answered to his satisfaction. he is encouraged to contact clinic should he have any questions or concerns prior to his return visit.   Follow up plan: - Return in about 6 months (around 11/16/2021) for F/U with Pre-visit Labs, A1c -NV.  Glade Lloyd, MD [facility] [address] [place], [place] [address] Phone: [phone]  Fax: [phone]    05/19/2021, 1:47 PM  This note was partially dictated with voice recognition software. Similar sounding words can be transcribed inadequately or may not  be corrected upon review.

## 2021-05-19 NOTE — Patient Instructions (Signed)

## 2021-05-21 DIAGNOSIS — Z96641 Presence of right artificial hip joint: Secondary | ICD-10-CM | POA: Diagnosis not present

## 2021-05-21 DIAGNOSIS — M7061 Trochanteric bursitis, right hip: Secondary | ICD-10-CM | POA: Diagnosis not present

## 2021-05-25 DIAGNOSIS — M7061 Trochanteric bursitis, right hip: Secondary | ICD-10-CM | POA: Diagnosis not present

## 2021-05-25 DIAGNOSIS — Z96641 Presence of right artificial hip joint: Secondary | ICD-10-CM | POA: Diagnosis not present

## 2021-05-26 DIAGNOSIS — M549 Dorsalgia, unspecified: Secondary | ICD-10-CM | POA: Diagnosis not present

## 2021-05-26 DIAGNOSIS — Z01812 Encounter for preprocedural laboratory examination: Secondary | ICD-10-CM | POA: Diagnosis not present

## 2021-05-26 DIAGNOSIS — R7989 Other specified abnormal findings of blood chemistry: Secondary | ICD-10-CM | POA: Diagnosis not present

## 2021-05-26 DIAGNOSIS — I10 Essential (primary) hypertension: Secondary | ICD-10-CM | POA: Diagnosis not present

## 2021-05-26 DIAGNOSIS — Z01818 Encounter for other preprocedural examination: Secondary | ICD-10-CM | POA: Diagnosis not present

## 2021-05-26 DIAGNOSIS — I2699 Other pulmonary embolism without acute cor pulmonale: Secondary | ICD-10-CM | POA: Diagnosis not present

## 2021-05-26 DIAGNOSIS — M47816 Spondylosis without myelopathy or radiculopathy, lumbar region: Secondary | ICD-10-CM | POA: Diagnosis not present

## 2021-05-26 DIAGNOSIS — Z01810 Encounter for preprocedural cardiovascular examination: Secondary | ICD-10-CM | POA: Diagnosis not present

## 2021-05-26 DIAGNOSIS — M4714 Other spondylosis with myelopathy, thoracic region: Secondary | ICD-10-CM | POA: Diagnosis not present

## 2021-05-26 DIAGNOSIS — E785 Hyperlipidemia, unspecified: Secondary | ICD-10-CM | POA: Diagnosis not present

## 2021-05-26 DIAGNOSIS — E119 Type 2 diabetes mellitus without complications: Secondary | ICD-10-CM | POA: Diagnosis not present

## 2021-05-26 DIAGNOSIS — Z79899 Other long term (current) drug therapy: Secondary | ICD-10-CM | POA: Diagnosis not present

## 2021-05-26 DIAGNOSIS — J449 Chronic obstructive pulmonary disease, unspecified: Secondary | ICD-10-CM | POA: Diagnosis not present

## 2021-05-27 DIAGNOSIS — Z96641 Presence of right artificial hip joint: Secondary | ICD-10-CM | POA: Diagnosis not present

## 2021-05-27 DIAGNOSIS — M7061 Trochanteric bursitis, right hip: Secondary | ICD-10-CM | POA: Diagnosis not present

## 2021-06-01 DIAGNOSIS — G9520 Unspecified cord compression: Secondary | ICD-10-CM | POA: Diagnosis not present

## 2021-06-01 DIAGNOSIS — M4714 Other spondylosis with myelopathy, thoracic region: Secondary | ICD-10-CM | POA: Diagnosis not present

## 2021-06-01 DIAGNOSIS — M5136 Other intervertebral disc degeneration, lumbar region: Secondary | ICD-10-CM | POA: Diagnosis not present

## 2021-06-01 DIAGNOSIS — G9529 Other cord compression: Secondary | ICD-10-CM | POA: Diagnosis not present

## 2021-06-01 DIAGNOSIS — E119 Type 2 diabetes mellitus without complications: Secondary | ICD-10-CM | POA: Diagnosis not present

## 2021-06-01 DIAGNOSIS — Z885 Allergy status to narcotic agent status: Secondary | ICD-10-CM | POA: Diagnosis not present

## 2021-06-01 DIAGNOSIS — Z7982 Long term (current) use of aspirin: Secondary | ICD-10-CM | POA: Diagnosis not present

## 2021-06-01 DIAGNOSIS — E1142 Type 2 diabetes mellitus with diabetic polyneuropathy: Secondary | ICD-10-CM | POA: Diagnosis not present

## 2021-06-01 DIAGNOSIS — I10 Essential (primary) hypertension: Secondary | ICD-10-CM | POA: Diagnosis not present

## 2021-06-01 DIAGNOSIS — Z79899 Other long term (current) drug therapy: Secondary | ICD-10-CM | POA: Diagnosis not present

## 2021-06-01 DIAGNOSIS — Z888 Allergy status to other drugs, medicaments and biological substances status: Secondary | ICD-10-CM | POA: Diagnosis not present

## 2021-06-01 DIAGNOSIS — Z86711 Personal history of pulmonary embolism: Secondary | ICD-10-CM | POA: Diagnosis not present

## 2021-06-01 DIAGNOSIS — Z87891 Personal history of nicotine dependence: Secondary | ICD-10-CM | POA: Diagnosis not present

## 2021-06-01 DIAGNOSIS — E785 Hyperlipidemia, unspecified: Secondary | ICD-10-CM | POA: Diagnosis not present

## 2021-06-01 DIAGNOSIS — J449 Chronic obstructive pulmonary disease, unspecified: Secondary | ICD-10-CM | POA: Diagnosis not present

## 2021-06-01 DIAGNOSIS — K219 Gastro-esophageal reflux disease without esophagitis: Secondary | ICD-10-CM | POA: Diagnosis not present

## 2021-06-01 DIAGNOSIS — Z981 Arthrodesis status: Secondary | ICD-10-CM | POA: Diagnosis not present

## 2021-06-01 DIAGNOSIS — M1990 Unspecified osteoarthritis, unspecified site: Secondary | ICD-10-CM | POA: Diagnosis not present

## 2021-06-01 DIAGNOSIS — Z7984 Long term (current) use of oral hypoglycemic drugs: Secondary | ICD-10-CM | POA: Diagnosis not present

## 2021-06-06 DIAGNOSIS — R0902 Hypoxemia: Secondary | ICD-10-CM | POA: Diagnosis not present

## 2021-06-06 DIAGNOSIS — U071 COVID-19: Secondary | ICD-10-CM | POA: Diagnosis not present

## 2021-06-09 ENCOUNTER — Telehealth: Payer: Self-pay | Admitting: Internal Medicine

## 2021-06-09 ENCOUNTER — Other Ambulatory Visit: Payer: Self-pay

## 2021-06-09 MED ORDER — BUDESONIDE-FORMOTEROL FUMARATE 80-4.5 MCG/ACT IN AERO: INHALATION_SPRAY | RESPIRATORY_TRACT | 12 refills | Status: DC

## 2021-06-09 NOTE — Telephone Encounter (Signed)
Called [name] and notified them that we would send over a RX for symbicort electronically. Spoke to patient and notified him that [facility] in [place] will be calling him when the RX is ready. Nothing further needed at this time.

## 2021-06-09 NOTE — Telephone Encounter (Signed)
Mr. Patrick Aguilar also asked if the prescription was changed and has to be ordered can he use a refill from inhaler he has until it comes in.  [employer].  Please do no call daughter who is on DPR but may call patient.  Number in Epic is patient's.

## 2021-06-28 ENCOUNTER — Other Ambulatory Visit: Payer: Self-pay | Admitting: "Endocrinology

## 2021-06-28 ENCOUNTER — Telehealth: Payer: Self-pay | Admitting: "Endocrinology

## 2021-06-28 DIAGNOSIS — I10 Essential (primary) hypertension: Secondary | ICD-10-CM | POA: Diagnosis not present

## 2021-06-28 DIAGNOSIS — E1142 Type 2 diabetes mellitus with diabetic polyneuropathy: Secondary | ICD-10-CM | POA: Diagnosis not present

## 2021-06-28 DIAGNOSIS — J449 Chronic obstructive pulmonary disease, unspecified: Secondary | ICD-10-CM | POA: Diagnosis not present

## 2021-06-28 DIAGNOSIS — Z299 Encounter for prophylactic measures, unspecified: Secondary | ICD-10-CM | POA: Diagnosis not present

## 2021-06-28 DIAGNOSIS — M545 Low back pain, unspecified: Secondary | ICD-10-CM | POA: Diagnosis not present

## 2021-06-28 MED ORDER — GLIPIZIDE ER 5 MG PO TB24: 5.0000 mg | ORAL_TABLET | Freq: Every day | ORAL | 3 refills | Status: DC

## 2021-06-28 NOTE — Telephone Encounter (Signed)
Pt is calling and states he seen Dr Patrick Aguilar and was told that it was no sign of infection and that it seems to be his neuropathy. Pt wants to know if he needs to see [place] or what your advise is.

## 2021-07-01 DIAGNOSIS — Z79891 Long term (current) use of opiate analgesic: Secondary | ICD-10-CM | POA: Diagnosis not present

## 2021-07-02 DIAGNOSIS — G8929 Other chronic pain: Secondary | ICD-10-CM | POA: Diagnosis not present

## 2021-07-02 DIAGNOSIS — Z7982 Long term (current) use of aspirin: Secondary | ICD-10-CM | POA: Diagnosis not present

## 2021-07-02 DIAGNOSIS — M459 Ankylosing spondylitis of unspecified sites in spine: Secondary | ICD-10-CM | POA: Diagnosis not present

## 2021-07-02 DIAGNOSIS — Z87891 Personal history of nicotine dependence: Secondary | ICD-10-CM | POA: Diagnosis not present

## 2021-07-02 DIAGNOSIS — M1711 Unilateral primary osteoarthritis, right knee: Secondary | ICD-10-CM | POA: Diagnosis not present

## 2021-07-02 DIAGNOSIS — I10 Essential (primary) hypertension: Secondary | ICD-10-CM | POA: Diagnosis not present

## 2021-07-02 DIAGNOSIS — J9611 Chronic respiratory failure with hypoxia: Secondary | ICD-10-CM | POA: Diagnosis not present

## 2021-07-02 DIAGNOSIS — Z9981 Dependence on supplemental oxygen: Secondary | ICD-10-CM | POA: Diagnosis not present

## 2021-07-02 DIAGNOSIS — Z791 Long term (current) use of non-steroidal anti-inflammatories (NSAID): Secondary | ICD-10-CM | POA: Diagnosis not present

## 2021-07-02 DIAGNOSIS — E1165 Type 2 diabetes mellitus with hyperglycemia: Secondary | ICD-10-CM | POA: Diagnosis not present

## 2021-07-02 DIAGNOSIS — Z7984 Long term (current) use of oral hypoglycemic drugs: Secondary | ICD-10-CM | POA: Diagnosis not present

## 2021-07-02 DIAGNOSIS — K219 Gastro-esophageal reflux disease without esophagitis: Secondary | ICD-10-CM | POA: Diagnosis not present

## 2021-07-02 DIAGNOSIS — J449 Chronic obstructive pulmonary disease, unspecified: Secondary | ICD-10-CM | POA: Diagnosis not present

## 2021-07-02 DIAGNOSIS — M542 Cervicalgia: Secondary | ICD-10-CM | POA: Diagnosis not present

## 2021-07-02 DIAGNOSIS — Z8616 Personal history of COVID-19: Secondary | ICD-10-CM | POA: Diagnosis not present

## 2021-07-02 DIAGNOSIS — Z86711 Personal history of pulmonary embolism: Secondary | ICD-10-CM | POA: Diagnosis not present

## 2021-07-02 DIAGNOSIS — E538 Deficiency of other specified B group vitamins: Secondary | ICD-10-CM | POA: Diagnosis not present

## 2021-07-02 DIAGNOSIS — E7800 Pure hypercholesterolemia, unspecified: Secondary | ICD-10-CM | POA: Diagnosis not present

## 2021-07-02 DIAGNOSIS — E1142 Type 2 diabetes mellitus with diabetic polyneuropathy: Secondary | ICD-10-CM | POA: Diagnosis not present

## 2021-07-02 DIAGNOSIS — F32A Depression, unspecified: Secondary | ICD-10-CM | POA: Diagnosis not present

## 2021-07-02 DIAGNOSIS — Z79891 Long term (current) use of opiate analgesic: Secondary | ICD-10-CM | POA: Diagnosis not present

## 2021-07-02 DIAGNOSIS — Z9181 History of falling: Secondary | ICD-10-CM | POA: Diagnosis not present

## 2021-07-06 DIAGNOSIS — Z9981 Dependence on supplemental oxygen: Secondary | ICD-10-CM | POA: Diagnosis not present

## 2021-07-06 DIAGNOSIS — Z79891 Long term (current) use of opiate analgesic: Secondary | ICD-10-CM | POA: Diagnosis not present

## 2021-07-06 DIAGNOSIS — E1165 Type 2 diabetes mellitus with hyperglycemia: Secondary | ICD-10-CM | POA: Diagnosis not present

## 2021-07-06 DIAGNOSIS — E1142 Type 2 diabetes mellitus with diabetic polyneuropathy: Secondary | ICD-10-CM | POA: Diagnosis not present

## 2021-07-06 DIAGNOSIS — U071 COVID-19: Secondary | ICD-10-CM | POA: Diagnosis not present

## 2021-07-06 DIAGNOSIS — K219 Gastro-esophageal reflux disease without esophagitis: Secondary | ICD-10-CM | POA: Diagnosis not present

## 2021-07-06 DIAGNOSIS — Z86711 Personal history of pulmonary embolism: Secondary | ICD-10-CM | POA: Diagnosis not present

## 2021-07-06 DIAGNOSIS — Z7984 Long term (current) use of oral hypoglycemic drugs: Secondary | ICD-10-CM | POA: Diagnosis not present

## 2021-07-06 DIAGNOSIS — Z791 Long term (current) use of non-steroidal anti-inflammatories (NSAID): Secondary | ICD-10-CM | POA: Diagnosis not present

## 2021-07-06 DIAGNOSIS — Z7982 Long term (current) use of aspirin: Secondary | ICD-10-CM | POA: Diagnosis not present

## 2021-07-06 DIAGNOSIS — G8929 Other chronic pain: Secondary | ICD-10-CM | POA: Diagnosis not present

## 2021-07-06 DIAGNOSIS — F32A Depression, unspecified: Secondary | ICD-10-CM | POA: Diagnosis not present

## 2021-07-06 DIAGNOSIS — I10 Essential (primary) hypertension: Secondary | ICD-10-CM | POA: Diagnosis not present

## 2021-07-06 DIAGNOSIS — Z9181 History of falling: Secondary | ICD-10-CM | POA: Diagnosis not present

## 2021-07-06 DIAGNOSIS — J9611 Chronic respiratory failure with hypoxia: Secondary | ICD-10-CM | POA: Diagnosis not present

## 2021-07-06 DIAGNOSIS — Z8616 Personal history of COVID-19: Secondary | ICD-10-CM | POA: Diagnosis not present

## 2021-07-06 DIAGNOSIS — R0902 Hypoxemia: Secondary | ICD-10-CM | POA: Diagnosis not present

## 2021-07-06 DIAGNOSIS — J449 Chronic obstructive pulmonary disease, unspecified: Secondary | ICD-10-CM | POA: Diagnosis not present

## 2021-07-06 DIAGNOSIS — Z87891 Personal history of nicotine dependence: Secondary | ICD-10-CM | POA: Diagnosis not present

## 2021-07-06 DIAGNOSIS — E7800 Pure hypercholesterolemia, unspecified: Secondary | ICD-10-CM | POA: Diagnosis not present

## 2021-07-06 DIAGNOSIS — E538 Deficiency of other specified B group vitamins: Secondary | ICD-10-CM | POA: Diagnosis not present

## 2021-07-06 DIAGNOSIS — M459 Ankylosing spondylitis of unspecified sites in spine: Secondary | ICD-10-CM | POA: Diagnosis not present

## 2021-07-06 DIAGNOSIS — M542 Cervicalgia: Secondary | ICD-10-CM | POA: Diagnosis not present

## 2021-07-06 DIAGNOSIS — M1711 Unilateral primary osteoarthritis, right knee: Secondary | ICD-10-CM | POA: Diagnosis not present

## 2021-07-08 DIAGNOSIS — K219 Gastro-esophageal reflux disease without esophagitis: Secondary | ICD-10-CM | POA: Diagnosis not present

## 2021-07-08 DIAGNOSIS — M459 Ankylosing spondylitis of unspecified sites in spine: Secondary | ICD-10-CM | POA: Diagnosis not present

## 2021-07-08 DIAGNOSIS — Z9181 History of falling: Secondary | ICD-10-CM | POA: Diagnosis not present

## 2021-07-08 DIAGNOSIS — E7800 Pure hypercholesterolemia, unspecified: Secondary | ICD-10-CM | POA: Diagnosis not present

## 2021-07-08 DIAGNOSIS — G8929 Other chronic pain: Secondary | ICD-10-CM | POA: Diagnosis not present

## 2021-07-08 DIAGNOSIS — M1711 Unilateral primary osteoarthritis, right knee: Secondary | ICD-10-CM | POA: Diagnosis not present

## 2021-07-08 DIAGNOSIS — E1142 Type 2 diabetes mellitus with diabetic polyneuropathy: Secondary | ICD-10-CM | POA: Diagnosis not present

## 2021-07-08 DIAGNOSIS — E538 Deficiency of other specified B group vitamins: Secondary | ICD-10-CM | POA: Diagnosis not present

## 2021-07-08 DIAGNOSIS — Z9981 Dependence on supplemental oxygen: Secondary | ICD-10-CM | POA: Diagnosis not present

## 2021-07-08 DIAGNOSIS — Z8616 Personal history of COVID-19: Secondary | ICD-10-CM | POA: Diagnosis not present

## 2021-07-08 DIAGNOSIS — Z791 Long term (current) use of non-steroidal anti-inflammatories (NSAID): Secondary | ICD-10-CM | POA: Diagnosis not present

## 2021-07-08 DIAGNOSIS — Z79891 Long term (current) use of opiate analgesic: Secondary | ICD-10-CM | POA: Diagnosis not present

## 2021-07-08 DIAGNOSIS — Z87891 Personal history of nicotine dependence: Secondary | ICD-10-CM | POA: Diagnosis not present

## 2021-07-08 DIAGNOSIS — J449 Chronic obstructive pulmonary disease, unspecified: Secondary | ICD-10-CM | POA: Diagnosis not present

## 2021-07-08 DIAGNOSIS — E1165 Type 2 diabetes mellitus with hyperglycemia: Secondary | ICD-10-CM | POA: Diagnosis not present

## 2021-07-08 DIAGNOSIS — M542 Cervicalgia: Secondary | ICD-10-CM | POA: Diagnosis not present

## 2021-07-08 DIAGNOSIS — I10 Essential (primary) hypertension: Secondary | ICD-10-CM | POA: Diagnosis not present

## 2021-07-08 DIAGNOSIS — F32A Depression, unspecified: Secondary | ICD-10-CM | POA: Diagnosis not present

## 2021-07-08 DIAGNOSIS — Z7982 Long term (current) use of aspirin: Secondary | ICD-10-CM | POA: Diagnosis not present

## 2021-07-08 DIAGNOSIS — Z86711 Personal history of pulmonary embolism: Secondary | ICD-10-CM | POA: Diagnosis not present

## 2021-07-08 DIAGNOSIS — Z7984 Long term (current) use of oral hypoglycemic drugs: Secondary | ICD-10-CM | POA: Diagnosis not present

## 2021-07-08 DIAGNOSIS — J9611 Chronic respiratory failure with hypoxia: Secondary | ICD-10-CM | POA: Diagnosis not present

## 2021-07-13 ENCOUNTER — Other Ambulatory Visit: Payer: Self-pay

## 2021-07-13 ENCOUNTER — Ambulatory Visit: Payer: Medicare Other | Admitting: Internal Medicine

## 2021-07-13 ENCOUNTER — Encounter: Payer: Self-pay | Admitting: Internal Medicine

## 2021-07-13 DIAGNOSIS — R0609 Other forms of dyspnea: Secondary | ICD-10-CM | POA: Diagnosis not present

## 2021-07-13 DIAGNOSIS — J449 Chronic obstructive pulmonary disease, unspecified: Secondary | ICD-10-CM | POA: Diagnosis not present

## 2021-07-13 NOTE — Patient Instructions (Signed)
Plan A =  No regular / routine inhalers are needed   Plan B = Backup (to supplement plan A, not to replace it) Only use your albuterol inhaler as a rescue medication to be used if you can't catch your breath by resting or doing a relaxed purse lip breathing pattern.  - The less you use it, the better it will work when you need it. - Ok to use the inhaler up to 2 puffs  every 4 hours if you must but call for appointment if use goes up over your usual need - Don't leave home without it !!  (think of it like the spare tire for your car)   Plan C = Crisis (instead of Plan B but only if Plan B stops working) - only use your albuterol nebulizer if you first try Plan B and it fails to help > ok to use the nebulizer up to every 4 hours but if start needing it regularly call for immediate appointment    Ok to try Try albuterol 15 min before an activity (on alternating days)  that you know would make you short of breath and see if it makes any difference and if makes none then don't take albuterol after activity unless you can't catch your breath as this means it's the resting that helps, not the albuterol.      Make sure you check your oxygen saturation  at your highest level of activity  to be sure it stays over 90% and adjust  02 flow upward to maintain this level if needed but remember to turn it back to previous settings when you stop (to conserve your supply).    We are referring you next allergy  - return here if needed but bring your medications with your

## 2021-07-13 NOTE — Assessment & Plan Note (Signed)
S/p PE Jan 2021 rx with 4  Months  DOAC - venous dopplers 02/18/2021 >>> bilateral lower extremities negative for DVT - Echo  03/29/21  Nl  LV, nl LA, nl RV s PH  - 05/11/2021   Walked on RA x    approx 100 @ mod  pace, stopped due to back pain and sob  with lowest 02 sats 97%  - V/q 05/18/21 neg  - 07/13/2021   Walked on RA  x  One   lap(s) =  approx 150 ft  @ slow/walker pace, stopped due to feet sore with lowest 02 sats 96%    Main issue at this point is conditioning, not limited by airflow obst asthmatic or otherwise so rec just leave on prn saba/ advised on approp use:  Re SABA :  I spent extra time with pt today reviewing appropriate use of albuterol for prn use on exertion with the following points: 1) saba is for relief of sob that does not improve by walking a slower pace or resting but rather if the pt does not improve after trying this first. 2) If the pt is convinced, as many are, that saba helps recover from activity faster then it's easy to tell if this is the case by re-challenging : ie stop, take the inhaler, then p 5 minutes try the exact same activity (intensity of workload) that just caused the symptoms and see if they are substantially diminished or not after saba 3) if there is an activity that reproducibly causes the symptoms, try the saba 15 min before the activity on alternate days   If in fact the saba really does help, then fine to continue to use it prn but advised may need to look closer at the maintenance regimen being used to achieve better control of airways disease with exertion.

## 2021-07-13 NOTE — Progress Notes (Signed)
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     History of Present Illness  02/12/2021  Pulmonary/ 1st office eval/ [facility] / [place] [facility] re doe/ocugh  Chief Complaint  Patient presents with   Consult    Shortness of breath with activity  Dyspnea: push buggy one aisle and has to stop off 02  Walks around house easier while  on 3lpm  Cough: clear mucus x one hour  Sleep: on side / two pillows  SABA use: saba  02 3lpm POC  Rec Plan A = Automatic = Always=    stiolto 2 puffs each AM  Plan B = Backup (to supplement plan A, not to replace it) Only use your albuterol inhaler as a rescue medication Plan C = Crisis (instead of Plan B but only if Plan B stops working) - only use your albuterol nebulizer if you first try Plan B and it fails to help   Ok to try Try albuterol 15 min before an activity (on alternating days)  that you know would make you short of breath a Make sure you check your oxygen saturation  at your highest level of activity  to be sure it stays over 90%   Please schedule a follow up office visit in 6 weeks  with pfts on return  Add venous dopplers negative for DVT   03/26/2021  f/u ov/[place] office/Kaylie Ritter re: GOLD 0  doe/ uacs maint on stiolto  Chief Complaint  Patient presents with   Follow-up    Pt states hard to say if his breathing has improved b/c he has been inactive due to hot weather. He has not had to use his rescue inhaler or neb.    Dyspnea:  not really very active / does  fine  ac and more limited by back  Cough: none now  Sleeping: on side 2 pillows  SABA use: none  02: still has not using, not monitoring  Covid status: vax x 3  Rec We will to schedule you an echocardiogram  > wnl with nl LA and RV  Stay on Stiolto 2 pffs each am   Ok to Try albuterol 15 min before an activity  To get the most out of exercise, you need to be continuously aware that you are short of breath, but never out of breath, for at least 30 minutes daily.  Make sure you check your oxygen saturations at highest level of activity    Please schedule a follow up office visit in 6 weeks, call sooner if needed   Add:  consider change to symbicort 80 next ov    05/11/2021  f/u ov/ office/Youa Deloney re: uacs with elevated IgE, doe with nl spirometry maint on stiolto 2 puffs not sure helping ./ did not follow instructions re try albuterol    Chief Complaint  Patient presents with   Follow-up    Breathing is unchanged  since the last visit. He has used albuterol inhaler a few times since the last visit. He rarely uses neb.   Dyspnea:  limited by breathing and back about / [employer] shopping ok leaning on the cart / HC parking  Cough: none  Sleeping: no resp cc/ able to lie flat bed on side  SABA use: as above, never pre treats or rechallenges as rec  02: has concentrator not using  Covid status: vax x 3 / original covid infection never right since  Rec Prednisone 10 mg take  4 each am x 2 days,   2 each am x 2 days,  1 each am x 2 days and stop  If not better after the prednisone, stop the stiolto to see what difference  If a lot better after the prednisone start symbicort 80  Take 2 puffs first thing in am and then another 2 puffs about 12 hours later.  We will schedule you for V/q lung scan done 05/18/21 neg    07/13/2021  f/u ov/[place] office/Cindy Fullman re: uacs with high IgE maint on no maint rx  Chief Complaint  Patient presents with   Follow-up    Feels that symbicort inhaler is  not helping with his breathing.   Dyspnea:  really not limited by breathing but by back and leg pain Daytime nasal congestion and throat tickle still problematic      No obvious day to day or daytime variability or assoc excess/ purulent sputum or mucus plugs or hemoptysis or cp or chest tightness, subjective wheeze or overt  hb symptoms.   Sleeping  without nocturnal  or early am exacerbation  of respiratory  c/o's or need for noct saba. Also denies any obvious fluctuation of symptoms with weather or environmental changes or other aggravating or alleviating factors except as outlined above   No unusual exposure hx or h/o childhood pna/ asthma or knowledge of premature birth.  Current Allergies, Complete Past Medical History, Past Surgical History, Family History, and Social History were reviewed in [employer] record.  ROS  The following are not active complaints unless bolded Hoarseness, sore throat, dysphagia, dental problems, itching, sneezing,  nasal congestion or discharge of excess mucus or purulent secretions, ear ache,   fever, chills, sweats, unintended wt loss or wt gain, classically pleuritic or exertional cp,  orthopnea pnd or arm/hand swelling  or leg swelling, presyncope, palpitations, abdominal pain, anorexia, nausea, vomiting, diarrhea  or change in bowel habits or change in bladder habits, change in stools or change in urine, dysuria, hematuria,  rash, arthralgias, visual complaints, headache, numbness, weakness or ataxia or problems with walking or coordination,  change in mood or  memory.        Current Meds  Medication Sig   albuterol (PROVENTIL) (2.5 MG/3ML) 0.083% nebulizer solution Inhale 2.5 mg into the lungs every 6 (six) hours as needed for shortness of breath or wheezing.   albuterol (VENTOLIN HFA) 108 (90 Base) MCG/ACT inhaler Inhale 2 puffs into the lungs every 6 (six) hours as needed for wheezing or shortness of breath.   aspirin EC 81 MG  tablet Take 81 mg by mouth daily. Swallow whole.   atenolol-chlorthalidone (TENORETIC) 50-25 MG tablet Take 1 tablet by mouth daily with breakfast.    blood glucose meter kit and supplies KIT Dispense based on patient and insurance preference. Use up to four times daily as directed. (FOR ICD-9 250.00, 250.01).   budesonide-formoterol (SYMBICORT) 80-4.5 MCG/ACT inhaler Take 2 puffs first  thing in am and then another 2 puffs about 12 hours later.   diclofenac (VOLTAREN) 75 MG EC tablet Take 75 mg by mouth 2 (two) times daily.   doxazosin (CARDURA) 8 MG tablet Take 8 mg by mouth daily with breakfast.    famotidine (PEPCID) 20 MG tablet Take 20 mg by mouth 2 (two) times daily.   furosemide (LASIX) 20 MG tablet Take 20 mg by mouth daily after breakfast.    gabapentin (NEURONTIN) 800 MG tablet Take 800 mg by mouth every 6 (six) hours.    glipiZIDE (GLUCOTROL XL) 5 MG 24 hr tablet Take 1 tablet (5 mg total) by mouth daily with breakfast.   metFORMIN (GLUCOPHAGE) 500 MG tablet Take 500 mg by mouth once.   oxyCODONE-acetaminophen (PERCOCET) 10-325 MG tablet Take 1 tablet by mouth every 6 (six) hours.   pantoprazole (PROTONIX) 40 MG tablet Take 40 mg by mouth daily.   polyethylene glycol (MIRALAX / GLYCOLAX) packet Take 17 g by mouth daily as needed for mild constipation. (Patient taking differently: Take 17 g by mouth daily after breakfast.)   potassium chloride SA (K-DUR,KLOR-CON) 20 MEQ tablet Take 40 mEq by mouth daily after breakfast.    predniSONE (DELTASONE) 10 MG tablet Take  4 each am x 2 days,   2 each am x 2 days,  1 each am x 2 days and stop   rosuvastatin (CRESTOR) 5 MG tablet Take 5 mg by mouth every Monday.   Tiotropium Bromide-Olodaterol (STIOLTO RESPIMAT) 2.5-2.5 MCG/ACT AERS Inhale 2 puffs into the lungs daily.   traZODone (DESYREL) 100 MG tablet Take 100 mg by mouth at bedtime.            Past Medical History:  Diagnosis Date   AKI (acute kidney injury) ([place]) 12/2017   Anxiety     Arthritis    Complication of anesthesia    low respirations, low BP   COPD (chronic obstructive pulmonary disease) ([facility])    DDD (degenerative disc disease), lumbar    Diabetes mellitus without complication ([place])    type 2   Diastolic dysfunction 83/25/4982   Moderate noted on ECHO   Fournier's gangrene in male    GERD (gastroesophageal reflux disease)    History of ARDS    History of necrotizing fasciitis    Severe   Hypertension    Lumbar spondylosis    Numbness and tingling of both upper extremities    Pulmonary hypertension ([place]) 12/14/2017   Mild, noted on ECHO   Respiratory failure, acute ([place]) 12/2017   Septic shock ([place]) 12/2017   Shortness of breath dyspnea    Testicular pain, left    Wears glasses        Objective:    07/13/2021       176   05/11/2021       190    03/26/21 196 lb (88.9 kg)  02/12/21 189 lb 9.6 oz (86 kg)  11/16/20 196 lb 6.4 oz (89.1 kg)     Vital signs reviewed  07/13/2021  - Note at rest 02 sats  98% on RA   General appearance:    somber wm nad  Lungs clear   HEENT : pt wearing mask not removed for exam due to covid -19 concerns.    NECK :  without JVD/Nodes/TM/ nl carotid upstrokes bilaterally   LUNGS: no acc muscle use,  Nl contour chest which is clear to A and P bilaterally without cough on insp or exp maneuvers  CV:  RRR  no s3 or murmur or increase in P2, and no edema   ABD:  soft and nontender with nl inspiratory excursion in the supine position. No bruits or organomegaly appreciated, bowel sounds nl  MS: slow with walker ext warm without deformities, calf tenderness, cyanosis or clubbing No obvious joint restrictions   SKIN: warm and dry without lesions    NEURO:  alert, approp, nl sensorium with  no motor or cerebellar deficits apparent.             Assessment     Outpatient Encounter Medications as of 07/13/2021  Medication Sig   albuterol (PROVENTIL) (2.5 MG/3ML) 0.083% nebulizer solution Inhale 2.5 mg into the  lungs every 6 (six) hours as needed for shortness of breath or wheezing.   albuterol (VENTOLIN HFA) 108 (90 Base) MCG/ACT inhaler Inhale 2 puffs into the lungs every 6 (six) hours as needed for wheezing or shortness of breath.   aspirin EC 81 MG tablet Take 81 mg by mouth daily. Swallow whole.   atenolol-chlorthalidone (TENORETIC) 50-25 MG tablet Take 1 tablet by mouth daily with breakfast.    blood glucose meter kit and supplies KIT Dispense based on patient and insurance preference. Use up to four times daily as directed. (FOR ICD-9 250.00, 250.01).   diclofenac (VOLTAREN) 75 MG EC tablet Take 75 mg by mouth 2 (two) times daily.   doxazosin (CARDURA) 8 MG tablet Take 8 mg by mouth daily with breakfast.    famotidine (PEPCID) 20 MG tablet Take 20 mg by mouth 2 (two) times daily.   furosemide (LASIX) 20 MG tablet Take 20 mg by mouth daily after breakfast.    gabapentin (NEURONTIN) 800 MG tablet Take 800 mg by mouth every 6 (six) hours.    glipiZIDE (GLUCOTROL XL) 5 MG 24 hr tablet Take 1 tablet (5 mg total) by mouth daily with breakfast.   metFORMIN (GLUCOPHAGE) 500 MG tablet Take 500 mg by mouth once.   oxyCODONE-acetaminophen (PERCOCET) 10-325 MG tablet Take 1 tablet by mouth every 6 (six) hours.   pantoprazole (PROTONIX) 40 MG tablet Take 40 mg by mouth daily.   polyethylene glycol (MIRALAX / GLYCOLAX) packet Take 17 g by mouth daily as needed for mild constipation. (Patient taking differently: Take 17 g by mouth daily after breakfast.)   potassium chloride SA (K-DUR,KLOR-CON) 20 MEQ tablet Take 40 mEq by mouth daily after breakfast.    rosuvastatin (CRESTOR) 5 MG tablet Take 5 mg by mouth every Monday.   traZODone (DESYREL) 100 MG tablet Take 100 mg by mouth at bedtime.

## 2021-07-13 NOTE — Assessment & Plan Note (Signed)
Quit smoking 2018 - Onset of symptoms ?  2000 ?  - 02/12/2021  After extensive coaching inhaler device,  effectiveness = 90%  Stiolto trial > improved  - Allergy profile  02/12/21  >  Eos 0.1/  IgE  376 - Spirometry 03/23/2021  FEV1 3.08 (102%)  Ratio 0.78 p 17 % improvement p saba p stiolto prior to study    No evidenc of significant copd despite smoking hx - de does appear to mild asthma with high IgE but no better ex tol on multiple different bronchodilators and suspect occult (and probably clinically insignificant asthma here) but will refer him to allergy for 2nd opnion.    Each maintenance medication was reviewed in detail including emphasizing most importantly the difference between maintenance and prns and under what circumstances the prns are to be triggered using an action plan format where appropriate.  Total time for H and P, chart review, counseling, reviewing hfa/neb device(s) , directly observing portions of ambulatory 02 saturation study/ and generating customized AVS unique to this summary office visit / same day charting > 

## 2021-07-14 DIAGNOSIS — Z87891 Personal history of nicotine dependence: Secondary | ICD-10-CM | POA: Diagnosis not present

## 2021-07-14 DIAGNOSIS — Z8616 Personal history of COVID-19: Secondary | ICD-10-CM | POA: Diagnosis not present

## 2021-07-14 DIAGNOSIS — J9611 Chronic respiratory failure with hypoxia: Secondary | ICD-10-CM | POA: Diagnosis not present

## 2021-07-14 DIAGNOSIS — G8929 Other chronic pain: Secondary | ICD-10-CM | POA: Diagnosis not present

## 2021-07-14 DIAGNOSIS — K219 Gastro-esophageal reflux disease without esophagitis: Secondary | ICD-10-CM | POA: Diagnosis not present

## 2021-07-14 DIAGNOSIS — Z9981 Dependence on supplemental oxygen: Secondary | ICD-10-CM | POA: Diagnosis not present

## 2021-07-14 DIAGNOSIS — Z7984 Long term (current) use of oral hypoglycemic drugs: Secondary | ICD-10-CM | POA: Diagnosis not present

## 2021-07-14 DIAGNOSIS — M1711 Unilateral primary osteoarthritis, right knee: Secondary | ICD-10-CM | POA: Diagnosis not present

## 2021-07-14 DIAGNOSIS — Z79891 Long term (current) use of opiate analgesic: Secondary | ICD-10-CM | POA: Diagnosis not present

## 2021-07-14 DIAGNOSIS — Z9181 History of falling: Secondary | ICD-10-CM | POA: Diagnosis not present

## 2021-07-14 DIAGNOSIS — Z791 Long term (current) use of non-steroidal anti-inflammatories (NSAID): Secondary | ICD-10-CM | POA: Diagnosis not present

## 2021-07-14 DIAGNOSIS — Z7982 Long term (current) use of aspirin: Secondary | ICD-10-CM | POA: Diagnosis not present

## 2021-07-14 DIAGNOSIS — J449 Chronic obstructive pulmonary disease, unspecified: Secondary | ICD-10-CM | POA: Diagnosis not present

## 2021-07-14 DIAGNOSIS — M459 Ankylosing spondylitis of unspecified sites in spine: Secondary | ICD-10-CM | POA: Diagnosis not present

## 2021-07-14 DIAGNOSIS — Z86711 Personal history of pulmonary embolism: Secondary | ICD-10-CM | POA: Diagnosis not present

## 2021-07-14 DIAGNOSIS — F32A Depression, unspecified: Secondary | ICD-10-CM | POA: Diagnosis not present

## 2021-07-14 DIAGNOSIS — E538 Deficiency of other specified B group vitamins: Secondary | ICD-10-CM | POA: Diagnosis not present

## 2021-07-14 DIAGNOSIS — I10 Essential (primary) hypertension: Secondary | ICD-10-CM | POA: Diagnosis not present

## 2021-07-14 DIAGNOSIS — E1165 Type 2 diabetes mellitus with hyperglycemia: Secondary | ICD-10-CM | POA: Diagnosis not present

## 2021-07-14 DIAGNOSIS — E1142 Type 2 diabetes mellitus with diabetic polyneuropathy: Secondary | ICD-10-CM | POA: Diagnosis not present

## 2021-07-14 DIAGNOSIS — M542 Cervicalgia: Secondary | ICD-10-CM | POA: Diagnosis not present

## 2021-07-14 DIAGNOSIS — E7800 Pure hypercholesterolemia, unspecified: Secondary | ICD-10-CM | POA: Diagnosis not present

## 2021-07-15 DIAGNOSIS — Z79891 Long term (current) use of opiate analgesic: Secondary | ICD-10-CM | POA: Diagnosis not present

## 2021-07-15 DIAGNOSIS — Z7984 Long term (current) use of oral hypoglycemic drugs: Secondary | ICD-10-CM | POA: Diagnosis not present

## 2021-07-15 DIAGNOSIS — Z87891 Personal history of nicotine dependence: Secondary | ICD-10-CM | POA: Diagnosis not present

## 2021-07-15 DIAGNOSIS — J9611 Chronic respiratory failure with hypoxia: Secondary | ICD-10-CM | POA: Diagnosis not present

## 2021-07-15 DIAGNOSIS — E538 Deficiency of other specified B group vitamins: Secondary | ICD-10-CM | POA: Diagnosis not present

## 2021-07-15 DIAGNOSIS — E1142 Type 2 diabetes mellitus with diabetic polyneuropathy: Secondary | ICD-10-CM | POA: Diagnosis not present

## 2021-07-15 DIAGNOSIS — Z7982 Long term (current) use of aspirin: Secondary | ICD-10-CM | POA: Diagnosis not present

## 2021-07-15 DIAGNOSIS — K219 Gastro-esophageal reflux disease without esophagitis: Secondary | ICD-10-CM | POA: Diagnosis not present

## 2021-07-15 DIAGNOSIS — M1711 Unilateral primary osteoarthritis, right knee: Secondary | ICD-10-CM | POA: Diagnosis not present

## 2021-07-15 DIAGNOSIS — M542 Cervicalgia: Secondary | ICD-10-CM | POA: Diagnosis not present

## 2021-07-15 DIAGNOSIS — Z791 Long term (current) use of non-steroidal anti-inflammatories (NSAID): Secondary | ICD-10-CM | POA: Diagnosis not present

## 2021-07-15 DIAGNOSIS — E7800 Pure hypercholesterolemia, unspecified: Secondary | ICD-10-CM | POA: Diagnosis not present

## 2021-07-15 DIAGNOSIS — Z9981 Dependence on supplemental oxygen: Secondary | ICD-10-CM | POA: Diagnosis not present

## 2021-07-15 DIAGNOSIS — F32A Depression, unspecified: Secondary | ICD-10-CM | POA: Diagnosis not present

## 2021-07-15 DIAGNOSIS — E1165 Type 2 diabetes mellitus with hyperglycemia: Secondary | ICD-10-CM | POA: Diagnosis not present

## 2021-07-15 DIAGNOSIS — M459 Ankylosing spondylitis of unspecified sites in spine: Secondary | ICD-10-CM | POA: Diagnosis not present

## 2021-07-15 DIAGNOSIS — I10 Essential (primary) hypertension: Secondary | ICD-10-CM | POA: Diagnosis not present

## 2021-07-15 DIAGNOSIS — J449 Chronic obstructive pulmonary disease, unspecified: Secondary | ICD-10-CM | POA: Diagnosis not present

## 2021-07-15 DIAGNOSIS — G8929 Other chronic pain: Secondary | ICD-10-CM | POA: Diagnosis not present

## 2021-07-15 DIAGNOSIS — Z86711 Personal history of pulmonary embolism: Secondary | ICD-10-CM | POA: Diagnosis not present

## 2021-07-15 DIAGNOSIS — Z8616 Personal history of COVID-19: Secondary | ICD-10-CM | POA: Diagnosis not present

## 2021-07-15 DIAGNOSIS — Z9181 History of falling: Secondary | ICD-10-CM | POA: Diagnosis not present

## 2021-07-16 DIAGNOSIS — J449 Chronic obstructive pulmonary disease, unspecified: Secondary | ICD-10-CM | POA: Diagnosis not present

## 2021-07-20 DIAGNOSIS — Z9981 Dependence on supplemental oxygen: Secondary | ICD-10-CM | POA: Diagnosis not present

## 2021-07-20 DIAGNOSIS — Z79891 Long term (current) use of opiate analgesic: Secondary | ICD-10-CM | POA: Diagnosis not present

## 2021-07-20 DIAGNOSIS — E7800 Pure hypercholesterolemia, unspecified: Secondary | ICD-10-CM | POA: Diagnosis not present

## 2021-07-20 DIAGNOSIS — I10 Essential (primary) hypertension: Secondary | ICD-10-CM | POA: Diagnosis not present

## 2021-07-20 DIAGNOSIS — M542 Cervicalgia: Secondary | ICD-10-CM | POA: Diagnosis not present

## 2021-07-20 DIAGNOSIS — Z9181 History of falling: Secondary | ICD-10-CM | POA: Diagnosis not present

## 2021-07-20 DIAGNOSIS — Z87891 Personal history of nicotine dependence: Secondary | ICD-10-CM | POA: Diagnosis not present

## 2021-07-20 DIAGNOSIS — Z86711 Personal history of pulmonary embolism: Secondary | ICD-10-CM | POA: Diagnosis not present

## 2021-07-20 DIAGNOSIS — G8929 Other chronic pain: Secondary | ICD-10-CM | POA: Diagnosis not present

## 2021-07-20 DIAGNOSIS — Z791 Long term (current) use of non-steroidal anti-inflammatories (NSAID): Secondary | ICD-10-CM | POA: Diagnosis not present

## 2021-07-20 DIAGNOSIS — F32A Depression, unspecified: Secondary | ICD-10-CM | POA: Diagnosis not present

## 2021-07-20 DIAGNOSIS — M1711 Unilateral primary osteoarthritis, right knee: Secondary | ICD-10-CM | POA: Diagnosis not present

## 2021-07-20 DIAGNOSIS — J9611 Chronic respiratory failure with hypoxia: Secondary | ICD-10-CM | POA: Diagnosis not present

## 2021-07-20 DIAGNOSIS — Z7982 Long term (current) use of aspirin: Secondary | ICD-10-CM | POA: Diagnosis not present

## 2021-07-20 DIAGNOSIS — Z8616 Personal history of COVID-19: Secondary | ICD-10-CM | POA: Diagnosis not present

## 2021-07-20 DIAGNOSIS — M459 Ankylosing spondylitis of unspecified sites in spine: Secondary | ICD-10-CM | POA: Diagnosis not present

## 2021-07-20 DIAGNOSIS — E1165 Type 2 diabetes mellitus with hyperglycemia: Secondary | ICD-10-CM | POA: Diagnosis not present

## 2021-07-20 DIAGNOSIS — E1142 Type 2 diabetes mellitus with diabetic polyneuropathy: Secondary | ICD-10-CM | POA: Diagnosis not present

## 2021-07-20 DIAGNOSIS — E538 Deficiency of other specified B group vitamins: Secondary | ICD-10-CM | POA: Diagnosis not present

## 2021-07-20 DIAGNOSIS — K219 Gastro-esophageal reflux disease without esophagitis: Secondary | ICD-10-CM | POA: Diagnosis not present

## 2021-07-20 DIAGNOSIS — Z7984 Long term (current) use of oral hypoglycemic drugs: Secondary | ICD-10-CM | POA: Diagnosis not present

## 2021-07-20 DIAGNOSIS — J449 Chronic obstructive pulmonary disease, unspecified: Secondary | ICD-10-CM | POA: Diagnosis not present

## 2021-07-22 DIAGNOSIS — E538 Deficiency of other specified B group vitamins: Secondary | ICD-10-CM | POA: Diagnosis not present

## 2021-07-22 DIAGNOSIS — F32A Depression, unspecified: Secondary | ICD-10-CM | POA: Diagnosis not present

## 2021-07-22 DIAGNOSIS — G8929 Other chronic pain: Secondary | ICD-10-CM | POA: Diagnosis not present

## 2021-07-22 DIAGNOSIS — E1142 Type 2 diabetes mellitus with diabetic polyneuropathy: Secondary | ICD-10-CM | POA: Diagnosis not present

## 2021-07-22 DIAGNOSIS — Z8616 Personal history of COVID-19: Secondary | ICD-10-CM | POA: Diagnosis not present

## 2021-07-22 DIAGNOSIS — E1165 Type 2 diabetes mellitus with hyperglycemia: Secondary | ICD-10-CM | POA: Diagnosis not present

## 2021-07-22 DIAGNOSIS — Z7984 Long term (current) use of oral hypoglycemic drugs: Secondary | ICD-10-CM | POA: Diagnosis not present

## 2021-07-22 DIAGNOSIS — Z87891 Personal history of nicotine dependence: Secondary | ICD-10-CM | POA: Diagnosis not present

## 2021-07-22 DIAGNOSIS — M1711 Unilateral primary osteoarthritis, right knee: Secondary | ICD-10-CM | POA: Diagnosis not present

## 2021-07-22 DIAGNOSIS — I10 Essential (primary) hypertension: Secondary | ICD-10-CM | POA: Diagnosis not present

## 2021-07-22 DIAGNOSIS — J9611 Chronic respiratory failure with hypoxia: Secondary | ICD-10-CM | POA: Diagnosis not present

## 2021-07-22 DIAGNOSIS — Z9981 Dependence on supplemental oxygen: Secondary | ICD-10-CM | POA: Diagnosis not present

## 2021-07-22 DIAGNOSIS — M542 Cervicalgia: Secondary | ICD-10-CM | POA: Diagnosis not present

## 2021-07-22 DIAGNOSIS — M459 Ankylosing spondylitis of unspecified sites in spine: Secondary | ICD-10-CM | POA: Diagnosis not present

## 2021-07-22 DIAGNOSIS — Z9181 History of falling: Secondary | ICD-10-CM | POA: Diagnosis not present

## 2021-07-22 DIAGNOSIS — J449 Chronic obstructive pulmonary disease, unspecified: Secondary | ICD-10-CM | POA: Diagnosis not present

## 2021-07-22 DIAGNOSIS — E7800 Pure hypercholesterolemia, unspecified: Secondary | ICD-10-CM | POA: Diagnosis not present

## 2021-07-22 DIAGNOSIS — Z79891 Long term (current) use of opiate analgesic: Secondary | ICD-10-CM | POA: Diagnosis not present

## 2021-07-22 DIAGNOSIS — Z791 Long term (current) use of non-steroidal anti-inflammatories (NSAID): Secondary | ICD-10-CM | POA: Diagnosis not present

## 2021-07-22 DIAGNOSIS — Z7982 Long term (current) use of aspirin: Secondary | ICD-10-CM | POA: Diagnosis not present

## 2021-07-22 DIAGNOSIS — Z86711 Personal history of pulmonary embolism: Secondary | ICD-10-CM | POA: Diagnosis not present

## 2021-07-22 DIAGNOSIS — K219 Gastro-esophageal reflux disease without esophagitis: Secondary | ICD-10-CM | POA: Diagnosis not present

## 2021-08-04 DIAGNOSIS — I10 Essential (primary) hypertension: Secondary | ICD-10-CM | POA: Diagnosis not present

## 2021-08-04 DIAGNOSIS — E1165 Type 2 diabetes mellitus with hyperglycemia: Secondary | ICD-10-CM | POA: Diagnosis not present

## 2021-08-04 DIAGNOSIS — Z299 Encounter for prophylactic measures, unspecified: Secondary | ICD-10-CM | POA: Diagnosis not present

## 2021-08-04 DIAGNOSIS — E1142 Type 2 diabetes mellitus with diabetic polyneuropathy: Secondary | ICD-10-CM | POA: Diagnosis not present

## 2021-08-04 LAB — HEMOGLOBIN A1C: Hemoglobin A1C: 5.9

## 2021-08-06 DIAGNOSIS — U071 COVID-19: Secondary | ICD-10-CM | POA: Diagnosis not present

## 2021-08-06 DIAGNOSIS — R0902 Hypoxemia: Secondary | ICD-10-CM | POA: Diagnosis not present

## 2021-08-16 ENCOUNTER — Ambulatory Visit: Payer: Medicare Other | Admitting: Allergy & Immunology

## 2021-08-16 ENCOUNTER — Other Ambulatory Visit: Payer: Self-pay

## 2021-08-16 ENCOUNTER — Encounter: Payer: Self-pay | Admitting: Allergy & Immunology

## 2021-08-16 VITALS — BP 118/64 | HR 80 | Temp 98.2°F | Resp 16 | Ht 66.0 in | Wt 183.2 lb

## 2021-08-16 DIAGNOSIS — K219 Gastro-esophageal reflux disease without esophagitis: Secondary | ICD-10-CM

## 2021-08-16 DIAGNOSIS — J3089 Other allergic rhinitis: Secondary | ICD-10-CM

## 2021-08-16 DIAGNOSIS — R0602 Shortness of breath: Secondary | ICD-10-CM | POA: Diagnosis not present

## 2021-08-16 NOTE — Patient Instructions (Addendum)
1. SOB (shortness of breath) - Lung testing looked fairly good today. - I do not going to mess with any of your medicines since Dr. Sherene Sires is in charge of that.  2. Non-allergic rhinitis - Testing was negative to the entire panel, although dust mites was slightly reactive on the intradermal test. - I do not convinced that this is leading to your shortness of breath. - Copy of testing results provided. - Avoidance measures provided. - You could certainly try taking an over-the-counter antihistamine for a week or 2 to see if this helps at all. - Samples of Xyzal provided. - If this does not help after couple weeks, it is likely not allergic (especially in light of the fact of your pretty negative skin testing).  3. Return if symptoms worsen or fail to improve.    Please inform us of any Emergency Department visits, hospitalizations, or changes in symptoms. Call us before going to the ED for breathing or allergy symptoms since we might be able to fit you in for a sick visit. Feel free to contact us anytime with any questions, problems, or concerns.  It was a pleasure to meet you and your family today! You guys are a hoot!   Websites that have reliable patient information: 1. American Academy of Asthma, Allergy, and Immunology: www.aaaai.org 2. Food Allergy Research and Education (FARE): foodallergy.org 3. Mothers of Asthmatics: http://www.asthmacommunitynetwork.org 4. American College of Allergy, Asthma, and Immunology: www.acaai.org   COVID-19 Vaccine Information can be found at: [email or web address] For questions related to vaccine distribution or appointments, please email [email or web address] or call [phone].   We realize that you might be concerned about having an allergic reaction to the COVID19 vaccines. To help with that concern, WE ARE OFFERING THE COVID19 VACCINES IN OUR OFFICE! Ask the front desk for dates!      "Like" [place] on Facebook and Instagram for our latest updates!      A healthy democracy works best when [employer] participate! Make sure you are registered to vote! If you have moved or changed any of your contact information, you will need to get this updated before voting!  In some cases, you MAY be able to register to vote online: [email or web address]        Airborne Adult Perc - 08/16/21 1458     Time Antigen Placed 0250    Allergen Manufacturer Waynette Buttery    Location Back    Number of Test 59    Panel 1 Select    1. Control-Buffer 50% Glycerol Negative    2. Control-Histamine 1 mg/ml 2+    3. Albumin saline Negative    4. Bahia Negative    5. French Southern Territories Negative    6. Johnson Negative    7. Kentucky Blue Negative    8. Meadow Fescue Negative    9. Perennial Rye Negative    10. Sweet Vernal Negative    11. Timothy Negative    12. Cocklebur Negative    13. Burweed Marshelder Negative    14. Ragweed, short Negative    15. Ragweed, Giant Negative    16. Plantain,  English Negative    17. Lamb's Quarters Negative    18. Sheep Sorrell Negative    19. Rough Pigweed Negative    20. Marsh Elder, Rough Negative    21. Mugwort, Common Negative    22. Ash mix Negative    23. Birch mix Negative    24. Beech American Negative    25. Box, Elder Negative  26. Cedar, red Negative    27. Cottonwood, Guinea-Bissau Negative    28. Elm mix Negative    29. Hickory Negative    30. Maple mix Negative    31. Oak, Guinea-Bissau mix Negative    32. Pecan Pollen Negative    33. Pine mix Negative    34. Sycamore Eastern Negative    35. Walnut, Black Pollen Negative    36. Alternaria alternata Negative    37. Cladosporium Herbarum Negative    38. Aspergillus mix Negative    39. Penicillium mix Negative    40. Bipolaris sorokiniana (Helminthosporium) Negative    41. Drechslera spicifera (Curvularia) Negative    42. Mucor plumbeus Negative    43. Fusarium  moniliforme Negative    44. Aureobasidium pullulans (pullulara) Negative    45. Rhizopus oryzae Negative    46. Botrytis cinera Negative    47. Epicoccum nigrum Negative    48. Phoma betae Negative    49. Candida Albicans Negative    50. Trichophyton mentagrophytes Negative    51. Mite, D Farinae  5,000 AU/ml Negative    52. Mite, D Pteronyssinus  5,000 AU/ml Negative    53. Cat Hair 10,000 BAU/ml Negative    54.  Dog Epithelia Negative    55. Mixed Feathers Negative    56. Horse Epithelia Negative    57. Cockroach, German Negative    58. Mouse Negative    59. Tobacco Leaf Negative             Intradermal - 08/16/21 1540     Time Antigen Placed 1520    Allergen Manufacturer Greer    Location Arm    Number of Test 15    Intradermal Select    Control Negative    French Southern Territories Negative    Johnson Negative    7 Grass Negative    Ragweed mix Negative    Weed mix Negative    Tree mix Negative    Mold 1 Negative    Mold 2 Negative    Mold 3 Negative    Mold 4 Negative    Cat Negative    Dog Negative    Cockroach Negative    Mite mix 1+            Control of Dust Mite Allergen    Dust mites play a major role in allergic asthma and rhinitis.  They occur in environments with high humidity wherever human skin is found.  Dust mites absorb humidity from the atmosphere (ie, they do not drink) and feed on organic matter (including shed human and animal skin).  Dust mites are a microscopic type of insect that you cannot see with the naked eye.  High levels of dust mites have been detected from mattresses, pillows, carpets, upholstered furniture, bed covers, clothes, soft toys and any woven material.  The principal allergen of the dust mite is found in its feces.  A gram of dust may contain 1,000 mites and 250,000 fecal particles.  Mite antigen is easily measured in the air during house cleaning activities.  Dust mites do not bite and do not cause harm to humans, other than by  triggering allergies/asthma.    Ways to decrease your exposure to dust mites in your home:  Encase mattresses, box springs and pillows with a mite-impermeable barrier or cover   Wash sheets, blankets and drapes weekly in hot water (130 F) with detergent and dry them in a dryer on the hot setting.  Have the room cleaned frequently with a vacuum cleaner and a damp dust-mop.  For carpeting or rugs, vacuuming with a vacuum cleaner equipped with a high-efficiency particulate air (HEPA) filter.  The dust mite allergic individual should not be in a room which is being cleaned and should wait 1 hour after cleaning before going into the room. Do not sleep on upholstered furniture (eg, couches).   If possible removing carpeting, upholstered furniture and drapery from the home is ideal.  Horizontal blinds should be eliminated in the rooms where the person spends the most time (bedroom, study, television room).  Washable vinyl, roller-type shades are optimal. Remove all non-washable stuffed toys from the bedroom.  Wash stuffed toys weekly like sheets and blankets above.   Reduce indoor humidity to less than 50%.  Inexpensive humidity monitors can be purchased at most hardware stores.  Do not use a humidifier as can make the problem worse and are not recommended.

## 2021-08-16 NOTE — Progress Notes (Signed)
NEW PATIENT  Date of Service/Encounter:  08/16/21  Consult requested by: Monico Blitz, MD   Assessment:   SOB (shortness of breath)  Perennial allergic rhinitis (dust mites)  Gastroesophageal reflux disease  Previous smoker  Plan/Recommendations:    1. SOB (shortness of breath) - Lung testing looked fairly good today. - I do not going to mess with any of your medicines since Dr. [name] is in charge of that.  2. Non-allergic rhinitis - Testing was negative to the entire panel, although dust mites was slightly reactive on the intradermal test. - I do not convinced that this is leading to your shortness of breath. - Copy of testing results provided. - Avoidance measures provided. - You could certainly try taking an over-the-counter antihistamine for a week or 2 to see if this helps at all. - Samples of Xyzal provided. - If this does not help after couple weeks, it is likely not allergic (especially in light of the fact of your pretty negative skin testing).  3. Return if symptoms worsen or fail to improve.    This note in its entirety was forwarded to the Provider who requested this consultation.  Subjective:   Patrick Aguilar is a 70 y.o. male presenting today for evaluation of  Chief Complaint  Patient presents with   Breathing Problem    Dr. [name] referred thinks it maybe an allergy that's causing breathing issues.    Patrick Aguilar has a history of the following: Patient Active Problem List   Diagnosis Date Noted   Perennial allergic rhinitis 08/16/2021   Diabetes mellitus with stage 2 chronic kidney disease ([place]) 05/19/2021   SOB (shortness of breath) 02/12/2021   Chronic respiratory failure with hypoxia ([place]) 02/12/2021   Mixed hyperlipidemia 01/14/2020   OA (osteoarthritis) of hip 03/06/2019   Osteoarthritis of right hip 03/06/2019   Cellulitis of left leg 07/08/2018   Type 2 diabetes mellitus without complication ([place]) 09/73/5329   Non-healing left  groin open wound, initial encounter 01/08/2018   Essential hypertension, benign 12/21/2017   COPD  GOLD  0   12/21/2017   Fournier gangrene    Sialolithiasis of submandibular gland 03/12/2015    History obtained from: chart review and patient and newphew .  Patrick Aguilar was referred by Monico Blitz, MD.     Patrick Aguilar is a 70 y.o. male presenting for an evaluation of possible environmental allergies contributing to his shortness of breath.     Asthma/Respiratory Symptom History: He has a long standing breathing history. He has had a breathing test that demonstrated no COPD (see report below).  Dr. [name] has done an extensive workup including an echo in July 22 that showed normal left ventricular function.  He did a walking test that showed a decrease in his O2 sats to 97% only.  A V/Q study was done in September 20 22-year that was negative.  Per the last note, he felt that most of his symptoms were related to deconditioning.  He did have an IgE of 376 in June 2022.  His absolute eosinophil count was 100.     He had a prolonged hospitalizaiton for blood clots s/p COVID in January 2021. He was having some trouble breathing after this. He had some changes to his breathing medications. He was taken off of all of his breathing medications by Dr. [name], including Stiolto and albuterol.  Apparently, he did spend some time in a nursing facility as well.  Allergic Rhinitis Symptom History: He  has rare sneezing. He has never been allergy tested.  He does not use an antihistamine daily.  He does not use a nose spray.  GERD Symptom History: He is on famotidine twice a day for his reflux. Is also taking Protonix.  Prior to this, he was just on Zantac for a number of years.  However, when Zantac was taken off the market, he changed to Pepcid.  Otherwise, there is no history of other atopic diseases, including asthma, food allergies, drug allergies, stinging insect allergies, eczema, urticaria, or contact  dermatitis. There is no significant infectious history. Vaccinations are up to date.    Past Medical History: Patient Active Problem List   Diagnosis Date Noted   Perennial allergic rhinitis 08/16/2021   Diabetes mellitus with stage 2 chronic kidney disease ([place]) 05/19/2021   SOB (shortness of breath) 02/12/2021   Chronic respiratory failure with hypoxia ([place]) 02/12/2021   Mixed hyperlipidemia 01/14/2020   OA (osteoarthritis) of hip 03/06/2019   Osteoarthritis of right hip 03/06/2019   Cellulitis of left leg 07/08/2018   Type 2 diabetes mellitus without complication ([place]) 03/07/9484   Non-healing left groin open wound, initial encounter 01/08/2018   Essential hypertension, benign 12/21/2017   COPD  GOLD  0   12/21/2017   Fournier gangrene    Sialolithiasis of submandibular gland 03/12/2015    Medication List:  Allergies as of 08/16/2021       Reactions   Tramadol Other (See Comments)   tremor   Adenosine    can't tolerate  05/2005   Bupropion    Nausea   Morphine And Related Itching        Medication List        Accurate as of August 16, 2021  3:48 PM. If you have any questions, ask your nurse or doctor.          STOP taking these medications    doxazosin 8 MG tablet Commonly known as: CARDURA Stopped by: Valentina Shaggy, MD   polyethylene glycol 17 g packet Commonly known as: MIRALAX / GLYCOLAX Stopped by: Valentina Shaggy, MD   potassium chloride SA 20 MEQ tablet Commonly known as: KLOR-CON M Stopped by: Valentina Shaggy, MD       TAKE these medications    albuterol 108 (90 Base) MCG/ACT inhaler Commonly known as: VENTOLIN HFA Inhale 2 puffs into the lungs every 6 (six) hours as needed for wheezing or shortness of breath.   albuterol (2.5 MG/3ML) 0.083% nebulizer solution Commonly known as: PROVENTIL Inhale 2.5 mg into the lungs every 6 (six) hours as needed for shortness of breath or wheezing.   aspirin 81 MG EC tablet Take by  mouth. What changed: Another medication with the same name was removed. Continue taking this medication, and follow the directions you see here. Changed by: Valentina Shaggy, MD   atenolol-chlorthalidone 50-25 MG tablet Commonly known as: TENORETIC Take 1 tablet by mouth daily with breakfast.   blood glucose meter kit and supplies Kit Dispense based on patient and insurance preference. Use up to four times daily as directed. (FOR ICD-9 250.00, 250.01).   diclofenac 75 MG EC tablet Commonly known as: VOLTAREN Take 75 mg by mouth 2 (two) times daily. What changed: Another medication with the same name was removed. Continue taking this medication, and follow the directions you see here. Changed by: Valentina Shaggy, MD   doxazosin 8 MG 24 hr tablet Commonly known as: CARDURA XL Take by mouth.  famotidine 20 MG tablet Commonly known as: PEPCID Take 20 mg by mouth 2 (two) times daily. What changed: Another medication with the same name was removed. Continue taking this medication, and follow the directions you see here. Changed by: Valentina Shaggy, MD   furosemide 20 MG tablet Commonly known as: LASIX Take 20 mg by mouth daily after breakfast.   gabapentin 800 MG tablet Commonly known as: NEURONTIN Take 800 mg by mouth every 6 (six) hours.   glipiZIDE 5 MG 24 hr tablet Commonly known as: GLUCOTROL XL Take 1 tablet (5 mg total) by mouth daily with breakfast.   metFORMIN 500 MG tablet Commonly known as: GLUCOPHAGE Take 500 mg by mouth once. What changed: Another medication with the same name was removed. Continue taking this medication, and follow the directions you see here. Changed by: Valentina Shaggy, MD   oxyCODONE-acetaminophen 10-325 MG tablet Commonly known as: PERCOCET Take 1 tablet by mouth every 6 (six) hours.   OXYGEN Inhale 1-3 L into the lungs.   pantoprazole 40 MG tablet Commonly known as: PROTONIX Take 40 mg by mouth daily. What  changed: Another medication with the same name was removed. Continue taking this medication, and follow the directions you see here. Changed by: Valentina Shaggy, MD   potassium chloride 20 MEQ packet Commonly known as: KLOR-CON Take by mouth.   rosuvastatin 5 MG tablet Commonly known as: CRESTOR Take 5 mg by mouth every Monday.   traZODone 100 MG tablet Commonly known as: DESYREL Take 100 mg by mouth at bedtime.        Birth History: non-contributory  Developmental History: non-contributory  Past Surgical History: Past Surgical History:  Procedure Laterality Date   ADENOIDECTOMY     APPENDECTOMY     APPLICATION OF A-CELL OF CHEST/ABDOMEN N/A 01/08/2018   Procedure: APPLICATION OF A-CELL OF GROIN;  Surgeon: Wallace Going, DO;  Location: [facility];  Service: Plastics;  Laterality: N/A;   APPLICATION OF A-CELL OF EXTREMITY N/A 12/25/2017   Procedure: APPLICATION OF A-CELL;  Surgeon: Wallace Going, DO;  Location: [facility];  Service: Plastics;  Laterality: N/A;   BACK SURGERY     x5   CARDIAC CATHETERIZATION  2006   neg   CERVICAL DISC SURGERY     x2   COLONOSCOPY     DEBRIDEMENT AND CLOSURE WOUND N/A 01/26/2018   Procedure: REVISION OF PERINEUM WOUND WITH DEBRIDEMENT, PARTIAL CLOSURE OF PERINEUM, PLACEMENT OF [place];  Surgeon: Wallace Going, DO;  Location: [facility];  Service: Plastics;  Laterality: N/A;   DENTAL SURGERY     teeth extractions   groin wound  01/08/2018   : EXCISION OF GROIN WOUND WITH PLACEMENT OF ACELL, AND PRIMARY WOUND CLOSURE (N/A Scrotum)   I & D EXTREMITY N/A 03/12/2018   Procedure: IRRIGATION AND DEBRIDEMENT PERIMUM WOUND WITH CLOSURE;  Surgeon: Wallace Going, DO;  Location: [facility];  Service: Plastics;  Laterality: N/A;   INCISION AND DRAINAGE ABSCESS Left 07/11/2018   Procedure: INCISION AND DRAINAGE ABSCESS LEFT THIGH;  Surgeon: Franchot Gallo, MD;  Location: [facility];  Service: Urology;  Laterality: Left;    INCISION AND DRAINAGE OF WOUND N/A 12/25/2017   Procedure: Irrigation and debridement of Fournier's of scrotum with placement of testes in subcutaneous thigh pockets and Acell placement;  Surgeon: Wallace Going, DO;  Location: [facility];  Service: Plastics;  Laterality: N/A;   INCISION AND DRAINAGE OF WOUND N/A 01/08/2018   Procedure: EXCISION  OF GROIN WOUND WITH PLACEMENT OF ACELL, AND PRIMARY WOUND CLOSURE;  Surgeon: Wallace Going, DO;  Location: [facility];  Service: Plastics;  Laterality: N/A;   ORCHIECTOMY N/A 12/13/2017   Procedure: EXCISION OF SCROTUM AND DEBRIDEMENT OF PENIS;  Surgeon: Franchot Gallo, MD;  Location: [facility];  Service: Urology;  Laterality: N/A;   ORCHIECTOMY Left 06/22/2018   Procedure: ORCHIECTOMY;  Surgeon: Franchot Gallo, MD;  Location: [facility];  Service: Urology;  Laterality: Left;   PLANTAR FASCIA SURGERY Bilateral    shoulders Bilateral    rotator cuff   SUBMANDIBULAR GLAND EXCISION Left 03/12/2015   Procedure: LEFT SUBMANDIBULAR GLAND RESECTION;  Surgeon: Melida Quitter, MD;  Location: [facility];  Service: ENT;  Laterality: Left;   TONSILLECTOMY     TOTAL HIP ARTHROPLASTY Right 03/06/2019   Procedure: TOTAL HIP ARTHROPLASTY ANTERIOR APPROACH;  Surgeon: Gaynelle Arabian, MD;  Location: [facility];  Service: Orthopedics;  Laterality: Right;  110mn     Family History: Family History  Problem Relation Age of Onset   Lung cancer Mother    Bladder Cancer Father    Allergic rhinitis Neg Hx    Asthma Neg Hx    Eczema Neg Hx    Urticaria Neg Hx      Social History: Jacobi lives at home with his nephew.  He lives in a house that was built in the 1940s.  There is parquet flooring in the main living areas and laminate flooring in the bedrooms.  He has a heat pump for heating and cooling.  There is a dog inside of the home.  There are dust mite covers on the bed, but not the pillows.  There is no tobacco exposure.  He currently is disabled.  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  Negative for chills, fever, malaise/fatigue and weight loss.  HENT: Negative.  Negative for congestion, ear discharge, ear pain and sinus pain.   Eyes:  Negative for pain, discharge and redness.  Respiratory:  Positive for sputum production. Negative for cough, shortness of breath and wheezing.   Cardiovascular: Negative.  Negative for chest pain and palpitations.  Gastrointestinal:  Negative for abdominal pain, constipation, diarrhea, heartburn, nausea and vomiting.  Skin: Negative.  Negative for itching and rash.  Neurological:  Negative for dizziness and headaches.  Endo/Heme/Allergies:  Negative for environmental allergies. Does not bruise/bleed easily.      Objective:   Blood pressure 118/64, pulse 80, temperature 98.2 F (36.8 C), temperature source Temporal, resp. rate 16, height _0  (1.676 m), weight 183 lb 3.2 oz (83.1 kg), SpO2 95 %. Body mass index is 29.57 kg/m.   Physical Exam:   Physical Exam Vitals reviewed.  Constitutional:      Appearance: He is well-developed.     Comments: Boisterous.  Interactive.  HENT:     Head: Normocephalic and atraumatic.     Right Ear: Tympanic membrane, ear canal and external ear normal. No drainage, swelling or tenderness. Tympanic membrane is not injected, scarred, erythematous, retracted or bulging.     Left Ear: Tympanic membrane, ear canal and external ear normal. No drainage, swelling or tenderness. Tympanic membrane is not injected, scarred, erythematous, retracted or bulging.     Nose: No nasal deformity, septal deviation, mucosal edema or rhinorrhea.     Right Turbinates: Enlarged and swollen.     Left Turbinates: Enlarged and swollen.     Right Sinus: No maxillary sinus tenderness or  frontal sinus tenderness.     Left Sinus: No maxillary sinus tenderness or frontal sinus tenderness.     Mouth/Throat:     Mouth: Mucous membranes are not pale and  not dry.     Pharynx: Uvula midline.  Eyes:     General:        Right eye: No discharge.        Left eye: No discharge.     Conjunctiva/sclera: Conjunctivae normal.     Right eye: Right conjunctiva is not injected. No chemosis.    Left eye: Left conjunctiva is not injected. No chemosis.    Pupils: Pupils are equal, round, and reactive to light.  Cardiovascular:     Rate and Rhythm: Normal rate and regular rhythm.     Heart sounds: Normal heart sounds.  Pulmonary:     Effort: Pulmonary effort is normal. No tachypnea, accessory muscle usage or respiratory distress.     Breath sounds: Normal breath sounds. No wheezing, rhonchi or rales.     Comments: Moving air well in all lung fields.  No increased work of breathing. Chest:     Chest wall: No tenderness.  Abdominal:     Tenderness: There is no abdominal tenderness. There is no guarding or rebound.  Lymphadenopathy:     Head:     Right side of head: No submandibular, tonsillar or occipital adenopathy.     Left side of head: No submandibular, tonsillar or occipital adenopathy.     Cervical: No cervical adenopathy.  Skin:    General: Skin is warm.     Capillary Refill: Capillary refill takes less than 2 seconds.     Coloration: Skin is not pale.     Findings: No abrasion, erythema, petechiae or rash. Rash is not papular, urticarial or vesicular.     Comments: Multiple scars on his back from all of the back and neck surgeries.  Neurological:     Mental Status: He is alert.  Psychiatric:        Behavior: Behavior is cooperative.     Diagnostic studies:    Spirometry: results abnormal (FEV1: 2.18/78%, FVC: 3.53/96%, FEV1/FVC: 62%).    Spirometry consistent with mild obstructive disease.   Allergy Studies:     Airborne Adult Perc - 08/16/21 1458     Time Antigen Placed 0250    Allergen Manufacturer Lavella Hammock    Location Back    Number of Test 59    Panel 1 Select    1. Control-Buffer 50% Glycerol Negative    2.  Control-Histamine 1 mg/ml 2+    3. Albumin saline Negative    4. [place] Negative    5. [place] Negative    6. Johnson Negative    7. Roann Blue Negative    8. Meadow Fescue Negative    9. Perennial Rye Negative    10. Sweet Vernal Negative    11. Timothy Negative    12. Cocklebur Negative    13. Burweed Marshelder Negative    14. Ragweed, short Negative    15. Ragweed, Giant Negative    16. Plantain,  English Negative    17. Lamb's Quarters Negative    18. Sheep Sorrell Negative    19. Rough Pigweed Negative    20. Marsh Elder, Rough Negative    21. Mugwort, Common Negative    22. Ash mix Negative    23. Birch mix Negative    24. Beech American Negative    25. Box, Elder Negative  [place], red Negative    27. Cottonwood, [place] Negative    28. Elm mix Negative    29. Hickory Negative    30. Maple mix Negative    31. Oak, [place] mix Negative    32. Pecan Pollen Negative    33. Pine mix Negative    34. Sycamore Eastern Negative    35. Darby, Black Pollen Negative    36. Alternaria alternata Negative    37. Cladosporium Herbarum Negative    38. Aspergillus mix Negative    39. Penicillium mix Negative    40. Bipolaris sorokiniana (Helminthosporium) Negative    41. Drechslera spicifera (Curvularia) Negative    42. Mucor plumbeus Negative    43. Fusarium moniliforme Negative    44. Aureobasidium pullulans (pullulara) Negative    45. Rhizopus oryzae Negative    46. Botrytis cinera Negative    47. Epicoccum nigrum Negative    48. Phoma betae Negative    49. Candida Albicans Negative    50. Trichophyton mentagrophytes Negative    51. Mite, D Farinae  5,000 AU/ml Negative    52. Mite, D Pteronyssinus  5,000 AU/ml Negative    53. Cat Hair 10,000 BAU/ml Negative    54.  Dog Epithelia Negative    55. Mixed Feathers Negative    56. Horse Epithelia Negative    57. Cockroach, German Negative    58. Mouse Negative    59. Tobacco Leaf Negative              Intradermal - 08/16/21 1540     Time Antigen Placed 1520    Allergen Manufacturer [place]er    Location Arm    Number of Test 15    Intradermal Select    Control Negative    [place] Negative    Johnson Negative    7 Grass Negative    Ragweed mix Negative    Weed mix Negative    Tree mix Negative    Mold 1 Negative    Mold 2 Negative    Mold 3 Negative    Mold 4 Negative    Cat Negative    Dog Negative    Cockroach Negative    Mite mix 1+             Allergy testing results were read and interpreted by myself, documented by clinical staff.         Salvatore Marvel, MD [facility]

## 2021-09-05 DIAGNOSIS — R0902 Hypoxemia: Secondary | ICD-10-CM | POA: Diagnosis not present

## 2021-09-05 DIAGNOSIS — U071 COVID-19: Secondary | ICD-10-CM | POA: Diagnosis not present

## 2021-09-09 DIAGNOSIS — G629 Polyneuropathy, unspecified: Secondary | ICD-10-CM | POA: Diagnosis not present

## 2021-09-09 DIAGNOSIS — E1165 Type 2 diabetes mellitus with hyperglycemia: Secondary | ICD-10-CM | POA: Diagnosis not present

## 2021-09-09 DIAGNOSIS — I10 Essential (primary) hypertension: Secondary | ICD-10-CM | POA: Diagnosis not present

## 2021-09-09 DIAGNOSIS — Z299 Encounter for prophylactic measures, unspecified: Secondary | ICD-10-CM | POA: Diagnosis not present

## 2021-09-09 DIAGNOSIS — J441 Chronic obstructive pulmonary disease with (acute) exacerbation: Secondary | ICD-10-CM | POA: Diagnosis not present

## 2021-09-23 DIAGNOSIS — I10 Essential (primary) hypertension: Secondary | ICD-10-CM | POA: Diagnosis not present

## 2021-09-23 DIAGNOSIS — Z299 Encounter for prophylactic measures, unspecified: Secondary | ICD-10-CM | POA: Diagnosis not present

## 2021-09-23 DIAGNOSIS — J9611 Chronic respiratory failure with hypoxia: Secondary | ICD-10-CM | POA: Diagnosis not present

## 2021-09-23 DIAGNOSIS — L739 Follicular disorder, unspecified: Secondary | ICD-10-CM | POA: Diagnosis not present

## 2021-09-23 DIAGNOSIS — L03115 Cellulitis of right lower limb: Secondary | ICD-10-CM | POA: Diagnosis not present

## 2021-09-25 ENCOUNTER — Other Ambulatory Visit: Payer: Self-pay | Admitting: "Endocrinology

## 2021-09-27 NOTE — Telephone Encounter (Signed)
Last OV 05/19/2021  Next OV 11/16/2021  Okay to continue this medication? I do not see listed in last OV notes.

## 2021-10-01 DIAGNOSIS — R29898 Other symptoms and signs involving the musculoskeletal system: Secondary | ICD-10-CM | POA: Diagnosis not present

## 2021-10-01 DIAGNOSIS — M4804 Spinal stenosis, thoracic region: Secondary | ICD-10-CM | POA: Diagnosis not present

## 2021-10-01 DIAGNOSIS — M48062 Spinal stenosis, lumbar region with neurogenic claudication: Secondary | ICD-10-CM | POA: Diagnosis not present

## 2021-10-01 DIAGNOSIS — M4714 Other spondylosis with myelopathy, thoracic region: Secondary | ICD-10-CM | POA: Diagnosis not present

## 2021-10-01 DIAGNOSIS — M5136 Other intervertebral disc degeneration, lumbar region: Secondary | ICD-10-CM | POA: Diagnosis not present

## 2021-10-01 DIAGNOSIS — G894 Chronic pain syndrome: Secondary | ICD-10-CM | POA: Diagnosis not present

## 2021-10-06 DIAGNOSIS — R0902 Hypoxemia: Secondary | ICD-10-CM | POA: Diagnosis not present

## 2021-10-06 DIAGNOSIS — U071 COVID-19: Secondary | ICD-10-CM | POA: Diagnosis not present

## 2021-10-07 ENCOUNTER — Other Ambulatory Visit: Payer: Self-pay | Admitting: "Endocrinology

## 2021-10-07 ENCOUNTER — Telehealth: Payer: Self-pay | Admitting: "Endocrinology

## 2021-10-07 DIAGNOSIS — Z299 Encounter for prophylactic measures, unspecified: Secondary | ICD-10-CM | POA: Diagnosis not present

## 2021-10-07 DIAGNOSIS — E119 Type 2 diabetes mellitus without complications: Secondary | ICD-10-CM

## 2021-10-07 DIAGNOSIS — I700 Atherosclerosis of aorta: Secondary | ICD-10-CM | POA: Diagnosis not present

## 2021-10-07 DIAGNOSIS — R269 Unspecified abnormalities of gait and mobility: Secondary | ICD-10-CM | POA: Diagnosis not present

## 2021-10-07 DIAGNOSIS — G629 Polyneuropathy, unspecified: Secondary | ICD-10-CM | POA: Diagnosis not present

## 2021-10-07 DIAGNOSIS — F17210 Nicotine dependence, cigarettes, uncomplicated: Secondary | ICD-10-CM | POA: Diagnosis not present

## 2021-10-07 DIAGNOSIS — I10 Essential (primary) hypertension: Secondary | ICD-10-CM | POA: Diagnosis not present

## 2021-10-07 MED ORDER — GLIPIZIDE 5 MG PO TABS: 5.0000 mg | ORAL_TABLET | Freq: Two times a day (BID) | ORAL | 0 refills | Status: DC

## 2021-10-07 NOTE — Telephone Encounter (Signed)
Pt states his sugar has been running higher than usual and he has not been eating anything he typically does not eat. I asked for readings and patient states they are on his meter and he does not know how to get the readings off of it.   [phone]

## 2021-10-07 NOTE — Telephone Encounter (Signed)
Discussed with pt, understanding voiced. 

## 2021-10-07 NOTE — Telephone Encounter (Signed)
Pt states he's not able to look back at his BG readings on his meter. He is scheduled to come in for visit next Tuesday at 3:00. He states his FBG has ranged from 170's-190's, he's taking glipizide 5mg  daily and metformin 500mg  daily.

## 2021-10-12 ENCOUNTER — Other Ambulatory Visit: Payer: Self-pay

## 2021-10-12 ENCOUNTER — Ambulatory Visit: Payer: Medicare Other | Admitting: "Endocrinology

## 2021-10-12 ENCOUNTER — Encounter: Payer: Self-pay | Admitting: "Endocrinology

## 2021-10-12 VITALS — BP 116/64 | HR 76 | Ht 66.0 in | Wt 183.0 lb

## 2021-10-12 DIAGNOSIS — E119 Type 2 diabetes mellitus without complications: Secondary | ICD-10-CM

## 2021-10-12 DIAGNOSIS — E782 Mixed hyperlipidemia: Secondary | ICD-10-CM | POA: Diagnosis not present

## 2021-10-12 DIAGNOSIS — I10 Essential (primary) hypertension: Secondary | ICD-10-CM

## 2021-10-12 NOTE — Progress Notes (Signed)
10/12/2021, 3:57 PM  Endocrinology follow-up note  Subjective:    Patient ID: Patrick Aguilar, male    DOB: 1951/07/05.  Patrick Aguilar is being seen in follow-up after he was seen in consultation for management of currently controlled asymptomatic diabetes requested by  Monico Blitz, MD.   Past Medical History:  Diagnosis Date   AKI (acute kidney injury) ([place]) 12/2017   Angio-edema    Anxiety    Arthritis    Complication of anesthesia    low respirations, low BP   COPD (chronic obstructive pulmonary disease) ([place])    DDD (degenerative disc disease), lumbar    Diabetes mellitus without complication ([place])    type 2   Diastolic dysfunction 03/70/4888   Moderate noted on ECHO   Fournier's gangrene in male    GERD (gastroesophageal reflux disease)    History of ARDS    History of necrotizing fasciitis    Severe   Hypertension    Lumbar spondylosis    Numbness and tingling of both upper extremities    Pulmonary hypertension ([place]) 12/14/2017   Mild, noted on ECHO   Recurrent upper respiratory infection (URI)    Respiratory failure, acute ([place]) 12/2017   Septic shock ([place]) 12/2017   Shortness of breath dyspnea    Testicular pain, left    Wears glasses     Past Surgical History:  Procedure Laterality Date   ADENOIDECTOMY     APPENDECTOMY     APPLICATION OF A-CELL OF CHEST/ABDOMEN N/A 01/08/2018   Procedure: APPLICATION OF A-CELL OF GROIN;  Surgeon: Wallace Going, DO;  Location: [facility];  Service: Plastics;  Laterality: N/A;   APPLICATION OF A-CELL OF EXTREMITY N/A 12/25/2017   Procedure: APPLICATION OF A-CELL;  Surgeon: Wallace Going, DO;  Location: [facility];  Service: Plastics;  Laterality: N/A;   BACK SURGERY     x5   CARDIAC CATHETERIZATION  2006   neg   CERVICAL DISC SURGERY     x2   COLONOSCOPY     DEBRIDEMENT AND CLOSURE WOUND N/A 01/26/2018   Procedure: REVISION OF PERINEUM WOUND WITH DEBRIDEMENT, PARTIAL CLOSURE OF  PERINEUM, PLACEMENT OF [place];  Surgeon: Wallace Going, DO;  Location: [facility];  Service: Plastics;  Laterality: N/A;   DENTAL SURGERY     teeth extractions   groin wound  01/08/2018   : EXCISION OF GROIN WOUND WITH PLACEMENT OF ACELL, AND PRIMARY WOUND CLOSURE (N/A Scrotum)   I & D EXTREMITY N/A 03/12/2018   Procedure: IRRIGATION AND DEBRIDEMENT PERIMUM WOUND WITH CLOSURE;  Surgeon: Wallace Going, DO;  Location: [facility];  Service: Plastics;  Laterality: N/A;   INCISION AND DRAINAGE ABSCESS Left 07/11/2018   Procedure: INCISION AND DRAINAGE ABSCESS LEFT THIGH;  Surgeon: Franchot Gallo, MD;  Location: [facility];  Service: Urology;  Laterality: Left;   INCISION AND DRAINAGE OF WOUND N/A 12/25/2017   Procedure: Irrigation and debridement of Fournier's of scrotum with placement of testes in subcutaneous thigh pockets and Acell placement;  Surgeon: Wallace Going, DO;  Location: [facility];  Service: Plastics;  Laterality: N/A;   INCISION AND DRAINAGE OF WOUND N/A 01/08/2018   Procedure: EXCISION OF GROIN WOUND WITH PLACEMENT OF ACELL, AND PRIMARY WOUND CLOSURE;  Surgeon: Wallace Going, DO;  Location: [facility];  Service: Plastics;  Laterality: N/A;   ORCHIECTOMY N/A 12/13/2017   Procedure: EXCISION OF SCROTUM AND DEBRIDEMENT OF PENIS;  Surgeon: Diona Fanti,  Annie Main, MD;  Location: [facility];  Service: Urology;  Laterality: N/A;   ORCHIECTOMY Left 06/22/2018   Procedure: ORCHIECTOMY;  Surgeon: Franchot Gallo, MD;  Location: [facility];  Service: Urology;  Laterality: Left;   PLANTAR FASCIA SURGERY Bilateral    shoulders Bilateral    rotator cuff   SUBMANDIBULAR GLAND EXCISION Left 03/12/2015   Procedure: LEFT SUBMANDIBULAR GLAND RESECTION;  Surgeon: Melida Quitter, MD;  Location: [place] [facility];  Service: ENT;  Laterality: Left;   TONSILLECTOMY     TOTAL HIP ARTHROPLASTY Right 03/06/2019   Procedure: TOTAL HIP ARTHROPLASTY ANTERIOR APPROACH;  Surgeon: Gaynelle Arabian, MD;  Location[facility];  Service: Orthopedics;  Laterality: Right;  184mn    Social History   Socioeconomic History   Marital status: Single    Spouse name: Not on file   Number of children: Not on file   Years of education: Not on file   Highest education level: Not on file  Occupational History   Not on file  Tobacco Use   Smoking status: Former    Packs/day: 1.00    Years: 50.00    Pack years: 50.00    Types: Cigarettes    Quit date: 12/13/2016    Years since quitting: 4.8   Smokeless tobacco: Never  Vaping Use   Vaping Use: Never used  Substance and Sexual Activity   Alcohol use: No    Comment: quit alcohol 80's   Drug use: No   Sexual activity: Not on file  Other Topics Concern   Not on file  Social History Narrative   Not on file   Social Determinants of Health   Financial Resource Strain: Not on file  Food Insecurity: Not on file  Transportation Needs: Not on file  Physical Activity: Not on file  Stress: Not on file  Social Connections: Not on file    Family History  Problem Relation Age of Onset   Lung cancer Mother    Bladder Cancer Father    Allergic rhinitis Neg Hx    Asthma Neg Hx    Eczema Neg Hx    Urticaria Neg Hx     Outpatient Encounter Medications as of 10/12/2021  Medication Sig   albuterol (PROVENTIL) (2.5 MG/3ML) 0.083% nebulizer solution Inhale 2.5 mg into the lungs every 6 (six) hours as needed for shortness of breath or wheezing.   albuterol (VENTOLIN HFA) 108 (90 Base) MCG/ACT inhaler Inhale 2 puffs into the lungs every 6 (six) hours as needed for wheezing or shortness of breath.   aspirin 81 MG EC tablet Take by mouth.   atenolol-chlorthalidone (TENORETIC) 50-25 MG tablet Take 1 tablet by mouth daily with breakfast.    blood glucose meter kit and supplies KIT Dispense based on patient and insurance preference. Use up to four times daily as directed. (FOR ICD-9 250.00, 250.01).   diclofenac (VOLTAREN) 75 MG EC tablet Take 75 mg  by mouth 2 (two) times daily.   doxazosin (CARDURA XL) 8 MG 24 hr tablet Take by mouth.   famotidine (PEPCID) 20 MG tablet Take 20 mg by mouth 2 (two) times daily.   furosemide (LASIX) 20 MG tablet Take 20 mg by mouth daily after breakfast.    gabapentin (NEURONTIN) 800 MG tablet Take 800 mg by mouth every 6 (six) hours.    glipiZIDE (GLUCOTROL) 5 MG tablet Take 1 tablet (5 mg total) by mouth 2 (two) times daily.   metFORMIN (GLUCOPHAGE) 500 MG tablet Take 500 mg by  mouth once.   oxyCODONE-acetaminophen (PERCOCET) 10-325 MG tablet Take 1 tablet by mouth every 6 (six) hours.   OXYGEN Inhale 1-3 L into the lungs.   pantoprazole (PROTONIX) 40 MG tablet Take 40 mg by mouth daily.   potassium chloride (KLOR-CON) 20 MEQ packet Take by mouth.   rosuvastatin (CRESTOR) 5 MG tablet Take 5 mg by mouth every Monday.   traZODone (DESYREL) 100 MG tablet Take 100 mg by mouth at bedtime.   No facility-administered encounter medications on file as of 10/12/2021.    ALLERGIES: Allergies  Allergen Reactions   Tramadol Other (See Comments)    tremor   Adenosine     can't tolerate  05/2005   Bupropion     Nausea   Morphine And Related Itching    VACCINATION STATUS: Immunization History  Administered Date(s) Administered   Td,absorbed, Preservative Free, Adult Use, Lf Unspecified 03/05/2007   Tdap 08/10/2017   Zoster Recombinat (Shingrix) 08/10/2017    Diabetes He presents for his follow-up diabetic visit. He has type 2 diabetes mellitus. Onset time: She was diagnosed at approximate age of 61 years. His disease course has been improving. There are no hypoglycemic associated symptoms. Pertinent negatives for hypoglycemia include no confusion, headaches, pallor or seizures. There are no diabetic associated symptoms. Pertinent negatives for diabetes include no chest pain, no fatigue, no polydipsia, no polyphagia, no polyuria and no weakness. There are no hypoglycemic complications. Symptoms are improving.  Diabetic complications include nephropathy. Risk factors for coronary artery disease include diabetes mellitus, dyslipidemia, hypertension, male sex and sedentary lifestyle. Current diabetic treatments: Is currently on Metformin 500 mg p.o. once  daily, glipizide 5 mg po BID. His weight is increasing steadily. He is following a generally unhealthy diet. When asked about meal planning, he reported none. He has not had a previous visit with a dietitian. He participates in exercise intermittently. There is no change in his home blood glucose trend. His breakfast blood glucose range is generally 180-200 mg/dl. His overall blood glucose range is 180-200 mg/dl. (His recent labs show A1c was 5.9%. More recently due to worsening right knee giving out, he is wheelchair bound, and his BG readings are increasing to average of 187.    ) An ACE inhibitor/angiotensin II receptor blocker is being taken.  Hyperlipidemia This is a chronic problem. The current episode started more than 1 year ago. Exacerbating diseases include diabetes. Pertinent negatives include no chest pain, myalgias or shortness of breath. Current antihyperlipidemic treatment includes statins. Risk factors for coronary artery disease include diabetes mellitus, dyslipidemia, male sex, hypertension and a sedentary lifestyle.  Hypertension This is a chronic problem. The current episode started more than 1 year ago. The problem is controlled. Pertinent negatives include no chest pain, headaches, neck pain, palpitations or shortness of breath. Risk factors for coronary artery disease include diabetes mellitus, dyslipidemia, sedentary lifestyle and smoking/tobacco exposure. Past treatments include beta blockers and diuretics. Hypertensive end-organ damage includes heart failure.    Review of Systems  Constitutional:  Negative for chills, fatigue, fever and unexpected weight change.  HENT:  Negative for dental problem, mouth sores and trouble swallowing.    Eyes:  Negative for visual disturbance.  Respiratory:  Negative for cough, choking, chest tightness, shortness of breath and wheezing.   Cardiovascular:  Negative for chest pain, palpitations and leg swelling.  Gastrointestinal:  Negative for abdominal distention, abdominal pain, constipation, diarrhea, nausea and vomiting.  Endocrine: Negative for polydipsia, polyphagia and polyuria.  Genitourinary:  Negative for dysuria,  flank pain, hematuria and urgency.  Musculoskeletal:  Positive for gait problem. Negative for back pain, myalgias and neck pain.       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   Skin:  Negative for pallor, rash and wound.  Neurological:  Negative for seizures, syncope, weakness, numbness and headaches.  Psychiatric/Behavioral:  Negative for confusion and dysphoric mood.    Objective:    Vitals with BMI 10/12/2021 08/16/2021 07/13/2021  Height '5\' 6"'  '5\' 6"'  '5\' 11"'   Weight 183 lbs 183 lbs 3 oz 176 lbs  BMI 29.55 77.82 42.35  Systolic 361 443 154  Diastolic 64 64 78  Pulse 76 80 97    BP 116/64    Pulse 76    Ht '5\' 6"'  (1.676 m)    Wt 183 lb (83 kg)    BMI 29.54 kg/m   Wt Readings from Last 3 Encounters:  10/12/21 183 lb (83 kg)  08/16/21 183 lb 3.2 oz (83.1 kg)  07/13/21 176 lb (79.8 kg)       CMP ( most recent) CMP     Component Value Date/Time   NA 143 05/13/2021 1129   K 4.2 05/13/2021 1129   CL 98 05/13/2021 1129   CO2 24 05/13/2021 1129   GLUCOSE 126 (H) 05/13/2021 1129   GLUCOSE 212 (H) 02/12/2021 1109   BUN 29 (H) 05/13/2021 1129   CREATININE 1.44 (H) 05/13/2021 1129   CREATININE 1.13 05/13/2020 0000   CALCIUM 9.6 05/13/2021 1129   PROT 6.5 05/13/2021 1129   ALBUMIN 4.4 05/13/2021 1129   AST 24 05/13/2021 1129   ALT 34 05/13/2021 1129   ALKPHOS 88 05/13/2021 1129   BILITOT 0.4 05/13/2021 1129   GFRNONAA >60 02/12/2021 1109   GFRNONAA 66 05/13/2020 0000   GFRAA 76 05/13/2020 0000     Diabetic Labs (most recent): Lab  Results  Component Value Date   HGBA1C 5.9 08/04/2021   HGBA1C 6.6 05/03/2021   HGBA1C 6.6 11/16/2020     Lipid Panel ( most recent) Lipid Panel     Component Value Date/Time   CHOL 145 05/13/2021 1129   TRIG 177 (H) 05/13/2021 1129   HDL 43 05/13/2021 1129   CHOLHDL 3.4 05/13/2021 1129   LDLCALC 72 05/13/2021 1129   LABVLDL 30 05/13/2021 1129     Assessment & Plan:   1. Type 2 diabetes mellitus without complication, without long-term current use of insulin (HCC)  - Patrick Aguilar has currently controlled asymptomatic type 2 DM since  54 years of age. He is accompanied by his friend to clinic today. His recent labs show A1c was 5.9%. More recently due to worsening right knee giving out, he is wheelchair bound, and his BG readings are increasing to average of 187.   - I had a long discussion with him about the progressive nature of diabetes and the pathology behind its complications. -his diabetes is complicated by  hx of  heavy smoking, deconditioning/sedentary life, diastolic dysfunction and he remains at a high risk for more acute and chronic complications which include CAD, CVA, CKD, retinopathy, and neuropathy. These are all discussed in detail with him.  - I have counseled him on diet  and weight management  by adopting a carbohydrate restricted/protein rich diet. Patient is encouraged to switch to  unprocessed or minimally processed     complex starch and increased protein intake (animal or plant source), fruits, and vegetables. -  he is advised to stick to  a routine mealtimes to eat 3 meals  a day and avoid unnecessary snacks ( to snack only to correct hypoglycemia).   - he acknowledges that there is a room for improvement in his food and drink choices. - Suggestion is made for him to avoid simple carbohydrates  from his diet including Cakes, Sweet Desserts, Ice Cream, Soda (diet and regular), Sweet Tea, Candies, Chips, Cookies, Store Bought Juices, Alcohol in Excess of   1-2 drinks a day, Artificial Sweeteners,  Coffee Creamer, and "Sugar-free" Products, Lemonade. This will help patient to have more stable blood glucose profile and potentially avoid unintended weight gain.  - he will be scheduled with Jearld Fenton, RDN, CDE for diabetes education.  - I have approached him with the following individualized plan to manage  his diabetes and patient agrees:   -He is advised to continue  metformin 500 mg  ER p.o. daily at breakfast, in light of his recent labs showing CKD.   - I discussed and added glipizide 5 mg po BID with breakfast and supper.    He is not a candidate for SGLT2 inhibitors due to his prior history of Fournier's gangrene. -Given his hypertriglyceridemia, a low-dose basal insulin will be the best choice if it is necessary to switch.   - Specific targets for  A1c;  LDL, HDL,  and Triglycerides were discussed with the patient.  2) Blood Pressure /Hypertension:  -His blood pressure is controlled to target.   He is advised to discontinue amlodipine.  He is advised to continue  atenolol/HCTZ mg p.o. daily with breakfast .  3) Lipids/Hyperlipidemia:   Review of his recent lipid panel showed improving hypertriglyceridemia at 177 improving from 259.  His LDL is 72.  He is advised to continue Crestor  5 mg p.o. nightly.   Side effects and precautions discussed with him.  4)  Weight/Diet:  Body mass index is 29.54 kg/m.  -He would benefit from some weight loss.   I discussed with him the fact that loss of 5 - 10% of his  current body weight will have the most impact on his diabetes management.  Exercise, and detailed carbohydrates information provided  -  detailed on discharge instructions.  5) Chronic Care/Health Maintenance:  -he  is on Statin medications and  is encouraged to initiate and continue to follow up with Ophthalmology, Dentist,  Podiatrist at least yearly or according to recommendations, and advised to   stay away from smoking. I have  recommended yearly flu vaccine and pneumonia vaccine at least every 5 years; moderate intensity exercise for up to 150 minutes weekly; and  sleep for at least 7 hours a day.  - he is  advised to maintain close follow up with Monico Blitz, MD for primary care needs, as well as his other providers for optimal and coordinated care.     I spent 41 minutes in the care of the patient today including review of labs from [place], Lipids, Thyroid Function, Hematology (current and previous including abstractions from other facilities); face-to-face time discussing  his blood glucose readings/logs, discussing hypoglycemia and hyperglycemia episodes and symptoms, medications doses, his options of short and long term treatment based on the latest standards of care / guidelines;  discussion about incorporating lifestyle medicine;  and documenting the encounter.    Please refer to Patient Instructions for Blood Glucose Monitoring and Insulin/Medications Dosing Guide"  in media tab for additional information. Please  also refer to " Patient Self Inventory" in the Media  tab for reviewed elements of pertinent patient history.  Cherylynn Ridges participated in the discussions, expressed understanding, and voiced agreement with the above plans.  All questions were answered to his satisfaction. he is encouraged to contact clinic should he have any questions or concerns prior to his return visit.   Follow up plan: - Return in about 6 months (around 04/11/2022) for Bring Meter and Logs- A1c in Office.  Glade Lloyd, MD [facility] [address] [place], [place] [address] Phone: [phone]  Fax: [phone]    10/12/2021, 3:57 PM  This note was partially dictated with voice recognition software. Similar sounding words can be transcribed inadequately or may not  be corrected upon review.

## 2021-10-19 DIAGNOSIS — M659 Synovitis and tenosynovitis, unspecified: Secondary | ICD-10-CM | POA: Diagnosis not present

## 2021-10-20 DIAGNOSIS — I700 Atherosclerosis of aorta: Secondary | ICD-10-CM | POA: Diagnosis not present

## 2021-10-20 DIAGNOSIS — E1165 Type 2 diabetes mellitus with hyperglycemia: Secondary | ICD-10-CM | POA: Diagnosis not present

## 2021-10-20 DIAGNOSIS — Z86711 Personal history of pulmonary embolism: Secondary | ICD-10-CM | POA: Diagnosis not present

## 2021-10-20 DIAGNOSIS — E538 Deficiency of other specified B group vitamins: Secondary | ICD-10-CM | POA: Diagnosis not present

## 2021-10-20 DIAGNOSIS — J9611 Chronic respiratory failure with hypoxia: Secondary | ICD-10-CM | POA: Diagnosis not present

## 2021-10-20 DIAGNOSIS — Z7982 Long term (current) use of aspirin: Secondary | ICD-10-CM | POA: Diagnosis not present

## 2021-10-20 DIAGNOSIS — F17210 Nicotine dependence, cigarettes, uncomplicated: Secondary | ICD-10-CM | POA: Diagnosis not present

## 2021-10-20 DIAGNOSIS — Z993 Dependence on wheelchair: Secondary | ICD-10-CM | POA: Diagnosis not present

## 2021-10-20 DIAGNOSIS — Z9981 Dependence on supplemental oxygen: Secondary | ICD-10-CM | POA: Diagnosis not present

## 2021-10-20 DIAGNOSIS — M545 Low back pain, unspecified: Secondary | ICD-10-CM | POA: Diagnosis not present

## 2021-10-20 DIAGNOSIS — M459 Ankylosing spondylitis of unspecified sites in spine: Secondary | ICD-10-CM | POA: Diagnosis not present

## 2021-10-20 DIAGNOSIS — E1142 Type 2 diabetes mellitus with diabetic polyneuropathy: Secondary | ICD-10-CM | POA: Diagnosis not present

## 2021-10-20 DIAGNOSIS — M159 Polyosteoarthritis, unspecified: Secondary | ICD-10-CM | POA: Diagnosis not present

## 2021-10-20 DIAGNOSIS — F32A Depression, unspecified: Secondary | ICD-10-CM | POA: Diagnosis not present

## 2021-10-20 DIAGNOSIS — Z7984 Long term (current) use of oral hypoglycemic drugs: Secondary | ICD-10-CM | POA: Diagnosis not present

## 2021-10-20 DIAGNOSIS — I10 Essential (primary) hypertension: Secondary | ICD-10-CM | POA: Diagnosis not present

## 2021-10-20 DIAGNOSIS — G8929 Other chronic pain: Secondary | ICD-10-CM | POA: Diagnosis not present

## 2021-10-20 DIAGNOSIS — M542 Cervicalgia: Secondary | ICD-10-CM | POA: Diagnosis not present

## 2021-10-20 DIAGNOSIS — M48061 Spinal stenosis, lumbar region without neurogenic claudication: Secondary | ICD-10-CM | POA: Diagnosis not present

## 2021-10-20 DIAGNOSIS — Z9181 History of falling: Secondary | ICD-10-CM | POA: Diagnosis not present

## 2021-10-20 DIAGNOSIS — E7800 Pure hypercholesterolemia, unspecified: Secondary | ICD-10-CM | POA: Diagnosis not present

## 2021-10-20 DIAGNOSIS — J449 Chronic obstructive pulmonary disease, unspecified: Secondary | ICD-10-CM | POA: Diagnosis not present

## 2021-10-22 DIAGNOSIS — E1142 Type 2 diabetes mellitus with diabetic polyneuropathy: Secondary | ICD-10-CM | POA: Diagnosis not present

## 2021-10-22 DIAGNOSIS — E1165 Type 2 diabetes mellitus with hyperglycemia: Secondary | ICD-10-CM | POA: Diagnosis not present

## 2021-10-22 DIAGNOSIS — M48061 Spinal stenosis, lumbar region without neurogenic claudication: Secondary | ICD-10-CM | POA: Diagnosis not present

## 2021-10-22 DIAGNOSIS — J9611 Chronic respiratory failure with hypoxia: Secondary | ICD-10-CM | POA: Diagnosis not present

## 2021-10-22 DIAGNOSIS — M459 Ankylosing spondylitis of unspecified sites in spine: Secondary | ICD-10-CM | POA: Diagnosis not present

## 2021-10-22 DIAGNOSIS — Z993 Dependence on wheelchair: Secondary | ICD-10-CM | POA: Diagnosis not present

## 2021-10-22 DIAGNOSIS — I10 Essential (primary) hypertension: Secondary | ICD-10-CM | POA: Diagnosis not present

## 2021-10-22 DIAGNOSIS — G8929 Other chronic pain: Secondary | ICD-10-CM | POA: Diagnosis not present

## 2021-10-22 DIAGNOSIS — E7800 Pure hypercholesterolemia, unspecified: Secondary | ICD-10-CM | POA: Diagnosis not present

## 2021-10-22 DIAGNOSIS — J449 Chronic obstructive pulmonary disease, unspecified: Secondary | ICD-10-CM | POA: Diagnosis not present

## 2021-10-22 DIAGNOSIS — I700 Atherosclerosis of aorta: Secondary | ICD-10-CM | POA: Diagnosis not present

## 2021-10-22 DIAGNOSIS — M542 Cervicalgia: Secondary | ICD-10-CM | POA: Diagnosis not present

## 2021-10-22 DIAGNOSIS — Z7984 Long term (current) use of oral hypoglycemic drugs: Secondary | ICD-10-CM | POA: Diagnosis not present

## 2021-10-22 DIAGNOSIS — M545 Low back pain, unspecified: Secondary | ICD-10-CM | POA: Diagnosis not present

## 2021-10-22 DIAGNOSIS — F32A Depression, unspecified: Secondary | ICD-10-CM | POA: Diagnosis not present

## 2021-10-22 DIAGNOSIS — Z7982 Long term (current) use of aspirin: Secondary | ICD-10-CM | POA: Diagnosis not present

## 2021-10-22 DIAGNOSIS — Z9981 Dependence on supplemental oxygen: Secondary | ICD-10-CM | POA: Diagnosis not present

## 2021-10-22 DIAGNOSIS — E538 Deficiency of other specified B group vitamins: Secondary | ICD-10-CM | POA: Diagnosis not present

## 2021-10-22 DIAGNOSIS — Z86711 Personal history of pulmonary embolism: Secondary | ICD-10-CM | POA: Diagnosis not present

## 2021-10-22 DIAGNOSIS — M159 Polyosteoarthritis, unspecified: Secondary | ICD-10-CM | POA: Diagnosis not present

## 2021-10-22 DIAGNOSIS — F17210 Nicotine dependence, cigarettes, uncomplicated: Secondary | ICD-10-CM | POA: Diagnosis not present

## 2021-10-22 DIAGNOSIS — Z9181 History of falling: Secondary | ICD-10-CM | POA: Diagnosis not present

## 2021-10-27 DIAGNOSIS — M459 Ankylosing spondylitis of unspecified sites in spine: Secondary | ICD-10-CM | POA: Diagnosis not present

## 2021-10-27 DIAGNOSIS — M159 Polyosteoarthritis, unspecified: Secondary | ICD-10-CM | POA: Diagnosis not present

## 2021-10-27 DIAGNOSIS — E7800 Pure hypercholesterolemia, unspecified: Secondary | ICD-10-CM | POA: Diagnosis not present

## 2021-10-27 DIAGNOSIS — M542 Cervicalgia: Secondary | ICD-10-CM | POA: Diagnosis not present

## 2021-10-27 DIAGNOSIS — Z993 Dependence on wheelchair: Secondary | ICD-10-CM | POA: Diagnosis not present

## 2021-10-27 DIAGNOSIS — M48061 Spinal stenosis, lumbar region without neurogenic claudication: Secondary | ICD-10-CM | POA: Diagnosis not present

## 2021-10-27 DIAGNOSIS — I700 Atherosclerosis of aorta: Secondary | ICD-10-CM | POA: Diagnosis not present

## 2021-10-27 DIAGNOSIS — Z7984 Long term (current) use of oral hypoglycemic drugs: Secondary | ICD-10-CM | POA: Diagnosis not present

## 2021-10-27 DIAGNOSIS — Z9981 Dependence on supplemental oxygen: Secondary | ICD-10-CM | POA: Diagnosis not present

## 2021-10-27 DIAGNOSIS — I10 Essential (primary) hypertension: Secondary | ICD-10-CM | POA: Diagnosis not present

## 2021-10-27 DIAGNOSIS — Z86711 Personal history of pulmonary embolism: Secondary | ICD-10-CM | POA: Diagnosis not present

## 2021-10-27 DIAGNOSIS — F32A Depression, unspecified: Secondary | ICD-10-CM | POA: Diagnosis not present

## 2021-10-27 DIAGNOSIS — E1142 Type 2 diabetes mellitus with diabetic polyneuropathy: Secondary | ICD-10-CM | POA: Diagnosis not present

## 2021-10-27 DIAGNOSIS — G8929 Other chronic pain: Secondary | ICD-10-CM | POA: Diagnosis not present

## 2021-10-27 DIAGNOSIS — Z9181 History of falling: Secondary | ICD-10-CM | POA: Diagnosis not present

## 2021-10-27 DIAGNOSIS — J449 Chronic obstructive pulmonary disease, unspecified: Secondary | ICD-10-CM | POA: Diagnosis not present

## 2021-10-27 DIAGNOSIS — Z7982 Long term (current) use of aspirin: Secondary | ICD-10-CM | POA: Diagnosis not present

## 2021-10-27 DIAGNOSIS — F17210 Nicotine dependence, cigarettes, uncomplicated: Secondary | ICD-10-CM | POA: Diagnosis not present

## 2021-10-27 DIAGNOSIS — E1165 Type 2 diabetes mellitus with hyperglycemia: Secondary | ICD-10-CM | POA: Diagnosis not present

## 2021-10-27 DIAGNOSIS — E538 Deficiency of other specified B group vitamins: Secondary | ICD-10-CM | POA: Diagnosis not present

## 2021-10-27 DIAGNOSIS — M545 Low back pain, unspecified: Secondary | ICD-10-CM | POA: Diagnosis not present

## 2021-10-27 DIAGNOSIS — J9611 Chronic respiratory failure with hypoxia: Secondary | ICD-10-CM | POA: Diagnosis not present

## 2021-10-29 DIAGNOSIS — I700 Atherosclerosis of aorta: Secondary | ICD-10-CM | POA: Diagnosis not present

## 2021-10-29 DIAGNOSIS — E538 Deficiency of other specified B group vitamins: Secondary | ICD-10-CM | POA: Diagnosis not present

## 2021-10-29 DIAGNOSIS — M542 Cervicalgia: Secondary | ICD-10-CM | POA: Diagnosis not present

## 2021-10-29 DIAGNOSIS — E1142 Type 2 diabetes mellitus with diabetic polyneuropathy: Secondary | ICD-10-CM | POA: Diagnosis not present

## 2021-10-29 DIAGNOSIS — M459 Ankylosing spondylitis of unspecified sites in spine: Secondary | ICD-10-CM | POA: Diagnosis not present

## 2021-10-29 DIAGNOSIS — F32A Depression, unspecified: Secondary | ICD-10-CM | POA: Diagnosis not present

## 2021-10-29 DIAGNOSIS — M545 Low back pain, unspecified: Secondary | ICD-10-CM | POA: Diagnosis not present

## 2021-10-29 DIAGNOSIS — Z993 Dependence on wheelchair: Secondary | ICD-10-CM | POA: Diagnosis not present

## 2021-10-29 DIAGNOSIS — E7800 Pure hypercholesterolemia, unspecified: Secondary | ICD-10-CM | POA: Diagnosis not present

## 2021-10-29 DIAGNOSIS — M159 Polyosteoarthritis, unspecified: Secondary | ICD-10-CM | POA: Diagnosis not present

## 2021-10-29 DIAGNOSIS — E1165 Type 2 diabetes mellitus with hyperglycemia: Secondary | ICD-10-CM | POA: Diagnosis not present

## 2021-10-29 DIAGNOSIS — I10 Essential (primary) hypertension: Secondary | ICD-10-CM | POA: Diagnosis not present

## 2021-10-29 DIAGNOSIS — G8929 Other chronic pain: Secondary | ICD-10-CM | POA: Diagnosis not present

## 2021-10-29 DIAGNOSIS — M48061 Spinal stenosis, lumbar region without neurogenic claudication: Secondary | ICD-10-CM | POA: Diagnosis not present

## 2021-10-29 DIAGNOSIS — J9611 Chronic respiratory failure with hypoxia: Secondary | ICD-10-CM | POA: Diagnosis not present

## 2021-10-29 DIAGNOSIS — F17210 Nicotine dependence, cigarettes, uncomplicated: Secondary | ICD-10-CM | POA: Diagnosis not present

## 2021-10-29 DIAGNOSIS — J449 Chronic obstructive pulmonary disease, unspecified: Secondary | ICD-10-CM | POA: Diagnosis not present

## 2021-10-29 DIAGNOSIS — Z7982 Long term (current) use of aspirin: Secondary | ICD-10-CM | POA: Diagnosis not present

## 2021-10-29 DIAGNOSIS — Z7984 Long term (current) use of oral hypoglycemic drugs: Secondary | ICD-10-CM | POA: Diagnosis not present

## 2021-10-29 DIAGNOSIS — Z9981 Dependence on supplemental oxygen: Secondary | ICD-10-CM | POA: Diagnosis not present

## 2021-10-29 DIAGNOSIS — Z9181 History of falling: Secondary | ICD-10-CM | POA: Diagnosis not present

## 2021-10-29 DIAGNOSIS — Z86711 Personal history of pulmonary embolism: Secondary | ICD-10-CM | POA: Diagnosis not present

## 2021-11-03 DIAGNOSIS — M48061 Spinal stenosis, lumbar region without neurogenic claudication: Secondary | ICD-10-CM | POA: Diagnosis not present

## 2021-11-03 DIAGNOSIS — Z7984 Long term (current) use of oral hypoglycemic drugs: Secondary | ICD-10-CM | POA: Diagnosis not present

## 2021-11-03 DIAGNOSIS — M545 Low back pain, unspecified: Secondary | ICD-10-CM | POA: Diagnosis not present

## 2021-11-03 DIAGNOSIS — F17210 Nicotine dependence, cigarettes, uncomplicated: Secondary | ICD-10-CM | POA: Diagnosis not present

## 2021-11-03 DIAGNOSIS — Z9981 Dependence on supplemental oxygen: Secondary | ICD-10-CM | POA: Diagnosis not present

## 2021-11-03 DIAGNOSIS — Z9181 History of falling: Secondary | ICD-10-CM | POA: Diagnosis not present

## 2021-11-03 DIAGNOSIS — J449 Chronic obstructive pulmonary disease, unspecified: Secondary | ICD-10-CM | POA: Diagnosis not present

## 2021-11-03 DIAGNOSIS — I700 Atherosclerosis of aorta: Secondary | ICD-10-CM | POA: Diagnosis not present

## 2021-11-03 DIAGNOSIS — M159 Polyosteoarthritis, unspecified: Secondary | ICD-10-CM | POA: Diagnosis not present

## 2021-11-03 DIAGNOSIS — E1165 Type 2 diabetes mellitus with hyperglycemia: Secondary | ICD-10-CM | POA: Diagnosis not present

## 2021-11-03 DIAGNOSIS — Z86711 Personal history of pulmonary embolism: Secondary | ICD-10-CM | POA: Diagnosis not present

## 2021-11-03 DIAGNOSIS — F32A Depression, unspecified: Secondary | ICD-10-CM | POA: Diagnosis not present

## 2021-11-03 DIAGNOSIS — M459 Ankylosing spondylitis of unspecified sites in spine: Secondary | ICD-10-CM | POA: Diagnosis not present

## 2021-11-03 DIAGNOSIS — G8929 Other chronic pain: Secondary | ICD-10-CM | POA: Diagnosis not present

## 2021-11-03 DIAGNOSIS — Z7982 Long term (current) use of aspirin: Secondary | ICD-10-CM | POA: Diagnosis not present

## 2021-11-03 DIAGNOSIS — Z993 Dependence on wheelchair: Secondary | ICD-10-CM | POA: Diagnosis not present

## 2021-11-03 DIAGNOSIS — E7800 Pure hypercholesterolemia, unspecified: Secondary | ICD-10-CM | POA: Diagnosis not present

## 2021-11-03 DIAGNOSIS — J9611 Chronic respiratory failure with hypoxia: Secondary | ICD-10-CM | POA: Diagnosis not present

## 2021-11-03 DIAGNOSIS — I10 Essential (primary) hypertension: Secondary | ICD-10-CM | POA: Diagnosis not present

## 2021-11-03 DIAGNOSIS — E1142 Type 2 diabetes mellitus with diabetic polyneuropathy: Secondary | ICD-10-CM | POA: Diagnosis not present

## 2021-11-03 DIAGNOSIS — E538 Deficiency of other specified B group vitamins: Secondary | ICD-10-CM | POA: Diagnosis not present

## 2021-11-03 DIAGNOSIS — M542 Cervicalgia: Secondary | ICD-10-CM | POA: Diagnosis not present

## 2021-11-05 DIAGNOSIS — Z7982 Long term (current) use of aspirin: Secondary | ICD-10-CM | POA: Diagnosis not present

## 2021-11-05 DIAGNOSIS — F32A Depression, unspecified: Secondary | ICD-10-CM | POA: Diagnosis not present

## 2021-11-05 DIAGNOSIS — M48061 Spinal stenosis, lumbar region without neurogenic claudication: Secondary | ICD-10-CM | POA: Diagnosis not present

## 2021-11-05 DIAGNOSIS — E538 Deficiency of other specified B group vitamins: Secondary | ICD-10-CM | POA: Diagnosis not present

## 2021-11-05 DIAGNOSIS — Z7984 Long term (current) use of oral hypoglycemic drugs: Secondary | ICD-10-CM | POA: Diagnosis not present

## 2021-11-05 DIAGNOSIS — E7800 Pure hypercholesterolemia, unspecified: Secondary | ICD-10-CM | POA: Diagnosis not present

## 2021-11-05 DIAGNOSIS — Z86711 Personal history of pulmonary embolism: Secondary | ICD-10-CM | POA: Diagnosis not present

## 2021-11-05 DIAGNOSIS — E1165 Type 2 diabetes mellitus with hyperglycemia: Secondary | ICD-10-CM | POA: Diagnosis not present

## 2021-11-05 DIAGNOSIS — M542 Cervicalgia: Secondary | ICD-10-CM | POA: Diagnosis not present

## 2021-11-05 DIAGNOSIS — I10 Essential (primary) hypertension: Secondary | ICD-10-CM | POA: Diagnosis not present

## 2021-11-05 DIAGNOSIS — J9611 Chronic respiratory failure with hypoxia: Secondary | ICD-10-CM | POA: Diagnosis not present

## 2021-11-05 DIAGNOSIS — Z9181 History of falling: Secondary | ICD-10-CM | POA: Diagnosis not present

## 2021-11-05 DIAGNOSIS — Z993 Dependence on wheelchair: Secondary | ICD-10-CM | POA: Diagnosis not present

## 2021-11-05 DIAGNOSIS — I700 Atherosclerosis of aorta: Secondary | ICD-10-CM | POA: Diagnosis not present

## 2021-11-05 DIAGNOSIS — Z9981 Dependence on supplemental oxygen: Secondary | ICD-10-CM | POA: Diagnosis not present

## 2021-11-05 DIAGNOSIS — F17210 Nicotine dependence, cigarettes, uncomplicated: Secondary | ICD-10-CM | POA: Diagnosis not present

## 2021-11-05 DIAGNOSIS — E1142 Type 2 diabetes mellitus with diabetic polyneuropathy: Secondary | ICD-10-CM | POA: Diagnosis not present

## 2021-11-05 DIAGNOSIS — M459 Ankylosing spondylitis of unspecified sites in spine: Secondary | ICD-10-CM | POA: Diagnosis not present

## 2021-11-05 DIAGNOSIS — J449 Chronic obstructive pulmonary disease, unspecified: Secondary | ICD-10-CM | POA: Diagnosis not present

## 2021-11-05 DIAGNOSIS — G8929 Other chronic pain: Secondary | ICD-10-CM | POA: Diagnosis not present

## 2021-11-05 DIAGNOSIS — M159 Polyosteoarthritis, unspecified: Secondary | ICD-10-CM | POA: Diagnosis not present

## 2021-11-05 DIAGNOSIS — M545 Low back pain, unspecified: Secondary | ICD-10-CM | POA: Diagnosis not present

## 2021-11-06 DIAGNOSIS — R0902 Hypoxemia: Secondary | ICD-10-CM | POA: Diagnosis not present

## 2021-11-06 DIAGNOSIS — U071 COVID-19: Secondary | ICD-10-CM | POA: Diagnosis not present

## 2021-11-10 ENCOUNTER — Telehealth: Payer: Self-pay | Admitting: "Endocrinology

## 2021-11-10 DIAGNOSIS — M459 Ankylosing spondylitis of unspecified sites in spine: Secondary | ICD-10-CM | POA: Diagnosis not present

## 2021-11-10 DIAGNOSIS — F17210 Nicotine dependence, cigarettes, uncomplicated: Secondary | ICD-10-CM | POA: Diagnosis not present

## 2021-11-10 DIAGNOSIS — Z9981 Dependence on supplemental oxygen: Secondary | ICD-10-CM | POA: Diagnosis not present

## 2021-11-10 DIAGNOSIS — E538 Deficiency of other specified B group vitamins: Secondary | ICD-10-CM | POA: Diagnosis not present

## 2021-11-10 DIAGNOSIS — Z993 Dependence on wheelchair: Secondary | ICD-10-CM | POA: Diagnosis not present

## 2021-11-10 DIAGNOSIS — G8929 Other chronic pain: Secondary | ICD-10-CM | POA: Diagnosis not present

## 2021-11-10 DIAGNOSIS — I10 Essential (primary) hypertension: Secondary | ICD-10-CM | POA: Diagnosis not present

## 2021-11-10 DIAGNOSIS — E1165 Type 2 diabetes mellitus with hyperglycemia: Secondary | ICD-10-CM | POA: Diagnosis not present

## 2021-11-10 DIAGNOSIS — M545 Low back pain, unspecified: Secondary | ICD-10-CM | POA: Diagnosis not present

## 2021-11-10 DIAGNOSIS — E1142 Type 2 diabetes mellitus with diabetic polyneuropathy: Secondary | ICD-10-CM | POA: Diagnosis not present

## 2021-11-10 DIAGNOSIS — Z7982 Long term (current) use of aspirin: Secondary | ICD-10-CM | POA: Diagnosis not present

## 2021-11-10 DIAGNOSIS — M48061 Spinal stenosis, lumbar region without neurogenic claudication: Secondary | ICD-10-CM | POA: Diagnosis not present

## 2021-11-10 DIAGNOSIS — M159 Polyosteoarthritis, unspecified: Secondary | ICD-10-CM | POA: Diagnosis not present

## 2021-11-10 DIAGNOSIS — J449 Chronic obstructive pulmonary disease, unspecified: Secondary | ICD-10-CM | POA: Diagnosis not present

## 2021-11-10 DIAGNOSIS — E119 Type 2 diabetes mellitus without complications: Secondary | ICD-10-CM

## 2021-11-10 DIAGNOSIS — E7800 Pure hypercholesterolemia, unspecified: Secondary | ICD-10-CM | POA: Diagnosis not present

## 2021-11-10 DIAGNOSIS — Z7984 Long term (current) use of oral hypoglycemic drugs: Secondary | ICD-10-CM | POA: Diagnosis not present

## 2021-11-10 DIAGNOSIS — F32A Depression, unspecified: Secondary | ICD-10-CM | POA: Diagnosis not present

## 2021-11-10 DIAGNOSIS — M542 Cervicalgia: Secondary | ICD-10-CM | POA: Diagnosis not present

## 2021-11-10 DIAGNOSIS — Z86711 Personal history of pulmonary embolism: Secondary | ICD-10-CM | POA: Diagnosis not present

## 2021-11-10 DIAGNOSIS — Z9181 History of falling: Secondary | ICD-10-CM | POA: Diagnosis not present

## 2021-11-10 DIAGNOSIS — I700 Atherosclerosis of aorta: Secondary | ICD-10-CM | POA: Diagnosis not present

## 2021-11-10 DIAGNOSIS — J9611 Chronic respiratory failure with hypoxia: Secondary | ICD-10-CM | POA: Diagnosis not present

## 2021-11-10 MED ORDER — GLIPIZIDE 5 MG PO TABS: 5.0000 mg | ORAL_TABLET | Freq: Two times a day (BID) | ORAL | 0 refills | Status: DC

## 2021-11-10 NOTE — Telephone Encounter (Signed)
Rx refill sent for a 90 day supply. ?

## 2021-11-10 NOTE — Telephone Encounter (Signed)
Pt is calling and requesting a refill on his medication and would like more than a 1 month supply ? ?glipiZIDE  ? Phone:  [phone]  ?Fax:  [phone]  ?  ?GLUCOTROL) 5 MG tablet  ? ?[facility] - [place], [place] - [phone] Phone:  [phone]  ?Fax:  [phone]  ?  ? ?

## 2021-11-12 DIAGNOSIS — M459 Ankylosing spondylitis of unspecified sites in spine: Secondary | ICD-10-CM | POA: Diagnosis not present

## 2021-11-12 DIAGNOSIS — M545 Low back pain, unspecified: Secondary | ICD-10-CM | POA: Diagnosis not present

## 2021-11-12 DIAGNOSIS — M159 Polyosteoarthritis, unspecified: Secondary | ICD-10-CM | POA: Diagnosis not present

## 2021-11-12 DIAGNOSIS — Z7982 Long term (current) use of aspirin: Secondary | ICD-10-CM | POA: Diagnosis not present

## 2021-11-12 DIAGNOSIS — I10 Essential (primary) hypertension: Secondary | ICD-10-CM | POA: Diagnosis not present

## 2021-11-12 DIAGNOSIS — E1142 Type 2 diabetes mellitus with diabetic polyneuropathy: Secondary | ICD-10-CM | POA: Diagnosis not present

## 2021-11-12 DIAGNOSIS — Z86711 Personal history of pulmonary embolism: Secondary | ICD-10-CM | POA: Diagnosis not present

## 2021-11-12 DIAGNOSIS — Z9181 History of falling: Secondary | ICD-10-CM | POA: Diagnosis not present

## 2021-11-12 DIAGNOSIS — M5459 Other low back pain: Secondary | ICD-10-CM | POA: Diagnosis not present

## 2021-11-12 DIAGNOSIS — Z9981 Dependence on supplemental oxygen: Secondary | ICD-10-CM | POA: Diagnosis not present

## 2021-11-12 DIAGNOSIS — F17210 Nicotine dependence, cigarettes, uncomplicated: Secondary | ICD-10-CM | POA: Diagnosis not present

## 2021-11-12 DIAGNOSIS — F32A Depression, unspecified: Secondary | ICD-10-CM | POA: Diagnosis not present

## 2021-11-12 DIAGNOSIS — M48061 Spinal stenosis, lumbar region without neurogenic claudication: Secondary | ICD-10-CM | POA: Diagnosis not present

## 2021-11-12 DIAGNOSIS — M25551 Pain in right hip: Secondary | ICD-10-CM | POA: Diagnosis not present

## 2021-11-12 DIAGNOSIS — E7800 Pure hypercholesterolemia, unspecified: Secondary | ICD-10-CM | POA: Diagnosis not present

## 2021-11-12 DIAGNOSIS — M542 Cervicalgia: Secondary | ICD-10-CM | POA: Diagnosis not present

## 2021-11-12 DIAGNOSIS — E1165 Type 2 diabetes mellitus with hyperglycemia: Secondary | ICD-10-CM | POA: Diagnosis not present

## 2021-11-12 DIAGNOSIS — J9611 Chronic respiratory failure with hypoxia: Secondary | ICD-10-CM | POA: Diagnosis not present

## 2021-11-12 DIAGNOSIS — Z993 Dependence on wheelchair: Secondary | ICD-10-CM | POA: Diagnosis not present

## 2021-11-12 DIAGNOSIS — E538 Deficiency of other specified B group vitamins: Secondary | ICD-10-CM | POA: Diagnosis not present

## 2021-11-12 DIAGNOSIS — I700 Atherosclerosis of aorta: Secondary | ICD-10-CM | POA: Diagnosis not present

## 2021-11-12 DIAGNOSIS — G8929 Other chronic pain: Secondary | ICD-10-CM | POA: Diagnosis not present

## 2021-11-12 DIAGNOSIS — Z7984 Long term (current) use of oral hypoglycemic drugs: Secondary | ICD-10-CM | POA: Diagnosis not present

## 2021-11-12 DIAGNOSIS — J449 Chronic obstructive pulmonary disease, unspecified: Secondary | ICD-10-CM | POA: Diagnosis not present

## 2021-11-12 DIAGNOSIS — M5416 Radiculopathy, lumbar region: Secondary | ICD-10-CM | POA: Diagnosis not present

## 2021-11-14 DIAGNOSIS — M545 Low back pain, unspecified: Secondary | ICD-10-CM | POA: Diagnosis not present

## 2021-11-16 ENCOUNTER — Ambulatory Visit: Payer: Medicare Other | Admitting: Family Medicine

## 2021-11-16 ENCOUNTER — Ambulatory Visit: Payer: Medicare Other | Admitting: "Endocrinology

## 2021-11-17 DIAGNOSIS — Z9981 Dependence on supplemental oxygen: Secondary | ICD-10-CM | POA: Diagnosis not present

## 2021-11-17 DIAGNOSIS — Z7982 Long term (current) use of aspirin: Secondary | ICD-10-CM | POA: Diagnosis not present

## 2021-11-17 DIAGNOSIS — Z993 Dependence on wheelchair: Secondary | ICD-10-CM | POA: Diagnosis not present

## 2021-11-17 DIAGNOSIS — E1142 Type 2 diabetes mellitus with diabetic polyneuropathy: Secondary | ICD-10-CM | POA: Diagnosis not present

## 2021-11-17 DIAGNOSIS — E1165 Type 2 diabetes mellitus with hyperglycemia: Secondary | ICD-10-CM | POA: Diagnosis not present

## 2021-11-17 DIAGNOSIS — E538 Deficiency of other specified B group vitamins: Secondary | ICD-10-CM | POA: Diagnosis not present

## 2021-11-17 DIAGNOSIS — I700 Atherosclerosis of aorta: Secondary | ICD-10-CM | POA: Diagnosis not present

## 2021-11-17 DIAGNOSIS — E7800 Pure hypercholesterolemia, unspecified: Secondary | ICD-10-CM | POA: Diagnosis not present

## 2021-11-17 DIAGNOSIS — M159 Polyosteoarthritis, unspecified: Secondary | ICD-10-CM | POA: Diagnosis not present

## 2021-11-17 DIAGNOSIS — Z7984 Long term (current) use of oral hypoglycemic drugs: Secondary | ICD-10-CM | POA: Diagnosis not present

## 2021-11-17 DIAGNOSIS — F17210 Nicotine dependence, cigarettes, uncomplicated: Secondary | ICD-10-CM | POA: Diagnosis not present

## 2021-11-17 DIAGNOSIS — J449 Chronic obstructive pulmonary disease, unspecified: Secondary | ICD-10-CM | POA: Diagnosis not present

## 2021-11-17 DIAGNOSIS — J9611 Chronic respiratory failure with hypoxia: Secondary | ICD-10-CM | POA: Diagnosis not present

## 2021-11-17 DIAGNOSIS — M48061 Spinal stenosis, lumbar region without neurogenic claudication: Secondary | ICD-10-CM | POA: Diagnosis not present

## 2021-11-17 DIAGNOSIS — G8929 Other chronic pain: Secondary | ICD-10-CM | POA: Diagnosis not present

## 2021-11-17 DIAGNOSIS — F32A Depression, unspecified: Secondary | ICD-10-CM | POA: Diagnosis not present

## 2021-11-17 DIAGNOSIS — I10 Essential (primary) hypertension: Secondary | ICD-10-CM | POA: Diagnosis not present

## 2021-11-17 DIAGNOSIS — M545 Low back pain, unspecified: Secondary | ICD-10-CM | POA: Diagnosis not present

## 2021-11-17 DIAGNOSIS — Z86711 Personal history of pulmonary embolism: Secondary | ICD-10-CM | POA: Diagnosis not present

## 2021-11-17 DIAGNOSIS — M459 Ankylosing spondylitis of unspecified sites in spine: Secondary | ICD-10-CM | POA: Diagnosis not present

## 2021-11-17 DIAGNOSIS — Z9181 History of falling: Secondary | ICD-10-CM | POA: Diagnosis not present

## 2021-11-17 DIAGNOSIS — M542 Cervicalgia: Secondary | ICD-10-CM | POA: Diagnosis not present

## 2021-11-19 DIAGNOSIS — E1142 Type 2 diabetes mellitus with diabetic polyneuropathy: Secondary | ICD-10-CM | POA: Diagnosis not present

## 2021-11-19 DIAGNOSIS — Z86711 Personal history of pulmonary embolism: Secondary | ICD-10-CM | POA: Diagnosis not present

## 2021-11-19 DIAGNOSIS — E7800 Pure hypercholesterolemia, unspecified: Secondary | ICD-10-CM | POA: Diagnosis not present

## 2021-11-19 DIAGNOSIS — G8929 Other chronic pain: Secondary | ICD-10-CM | POA: Diagnosis not present

## 2021-11-19 DIAGNOSIS — M159 Polyosteoarthritis, unspecified: Secondary | ICD-10-CM | POA: Diagnosis not present

## 2021-11-19 DIAGNOSIS — Z7984 Long term (current) use of oral hypoglycemic drugs: Secondary | ICD-10-CM | POA: Diagnosis not present

## 2021-11-19 DIAGNOSIS — Z993 Dependence on wheelchair: Secondary | ICD-10-CM | POA: Diagnosis not present

## 2021-11-19 DIAGNOSIS — E1165 Type 2 diabetes mellitus with hyperglycemia: Secondary | ICD-10-CM | POA: Diagnosis not present

## 2021-11-19 DIAGNOSIS — Z9981 Dependence on supplemental oxygen: Secondary | ICD-10-CM | POA: Diagnosis not present

## 2021-11-19 DIAGNOSIS — M48061 Spinal stenosis, lumbar region without neurogenic claudication: Secondary | ICD-10-CM | POA: Diagnosis not present

## 2021-11-19 DIAGNOSIS — I10 Essential (primary) hypertension: Secondary | ICD-10-CM | POA: Diagnosis not present

## 2021-11-19 DIAGNOSIS — M459 Ankylosing spondylitis of unspecified sites in spine: Secondary | ICD-10-CM | POA: Diagnosis not present

## 2021-11-19 DIAGNOSIS — Z7982 Long term (current) use of aspirin: Secondary | ICD-10-CM | POA: Diagnosis not present

## 2021-11-19 DIAGNOSIS — M545 Low back pain, unspecified: Secondary | ICD-10-CM | POA: Diagnosis not present

## 2021-11-19 DIAGNOSIS — E538 Deficiency of other specified B group vitamins: Secondary | ICD-10-CM | POA: Diagnosis not present

## 2021-11-19 DIAGNOSIS — M542 Cervicalgia: Secondary | ICD-10-CM | POA: Diagnosis not present

## 2021-11-19 DIAGNOSIS — F17210 Nicotine dependence, cigarettes, uncomplicated: Secondary | ICD-10-CM | POA: Diagnosis not present

## 2021-11-19 DIAGNOSIS — Z9181 History of falling: Secondary | ICD-10-CM | POA: Diagnosis not present

## 2021-11-19 DIAGNOSIS — I700 Atherosclerosis of aorta: Secondary | ICD-10-CM | POA: Diagnosis not present

## 2021-11-19 DIAGNOSIS — F32A Depression, unspecified: Secondary | ICD-10-CM | POA: Diagnosis not present

## 2021-11-19 DIAGNOSIS — J449 Chronic obstructive pulmonary disease, unspecified: Secondary | ICD-10-CM | POA: Diagnosis not present

## 2021-11-19 DIAGNOSIS — J9611 Chronic respiratory failure with hypoxia: Secondary | ICD-10-CM | POA: Diagnosis not present

## 2021-12-04 DIAGNOSIS — U071 COVID-19: Secondary | ICD-10-CM | POA: Diagnosis not present

## 2021-12-04 DIAGNOSIS — R0902 Hypoxemia: Secondary | ICD-10-CM | POA: Diagnosis not present

## 2021-12-10 DIAGNOSIS — Z79899 Other long term (current) drug therapy: Secondary | ICD-10-CM | POA: Diagnosis not present

## 2021-12-10 DIAGNOSIS — E1165 Type 2 diabetes mellitus with hyperglycemia: Secondary | ICD-10-CM | POA: Diagnosis not present

## 2021-12-10 DIAGNOSIS — I10 Essential (primary) hypertension: Secondary | ICD-10-CM | POA: Diagnosis not present

## 2021-12-10 DIAGNOSIS — M545 Low back pain, unspecified: Secondary | ICD-10-CM | POA: Diagnosis not present

## 2021-12-10 DIAGNOSIS — R5383 Other fatigue: Secondary | ICD-10-CM | POA: Diagnosis not present

## 2021-12-10 DIAGNOSIS — E7800 Pure hypercholesterolemia, unspecified: Secondary | ICD-10-CM | POA: Diagnosis not present

## 2021-12-10 DIAGNOSIS — Z0000 Encounter for general adult medical examination without abnormal findings: Secondary | ICD-10-CM | POA: Diagnosis not present

## 2021-12-10 DIAGNOSIS — Z87891 Personal history of nicotine dependence: Secondary | ICD-10-CM | POA: Diagnosis not present

## 2021-12-10 DIAGNOSIS — Z299 Encounter for prophylactic measures, unspecified: Secondary | ICD-10-CM | POA: Diagnosis not present

## 2021-12-10 DIAGNOSIS — Z7189 Other specified counseling: Secondary | ICD-10-CM | POA: Diagnosis not present

## 2021-12-14 DIAGNOSIS — M545 Low back pain, unspecified: Secondary | ICD-10-CM | POA: Diagnosis not present

## 2021-12-15 DIAGNOSIS — M4714 Other spondylosis with myelopathy, thoracic region: Secondary | ICD-10-CM | POA: Diagnosis not present

## 2021-12-29 DIAGNOSIS — M545 Low back pain, unspecified: Secondary | ICD-10-CM | POA: Diagnosis not present

## 2021-12-29 DIAGNOSIS — R2689 Other abnormalities of gait and mobility: Secondary | ICD-10-CM | POA: Diagnosis not present

## 2021-12-30 DIAGNOSIS — M4804 Spinal stenosis, thoracic region: Secondary | ICD-10-CM | POA: Diagnosis not present

## 2021-12-30 DIAGNOSIS — G894 Chronic pain syndrome: Secondary | ICD-10-CM | POA: Diagnosis not present

## 2021-12-30 DIAGNOSIS — Z79891 Long term (current) use of opiate analgesic: Secondary | ICD-10-CM | POA: Diagnosis not present

## 2021-12-30 DIAGNOSIS — R29898 Other symptoms and signs involving the musculoskeletal system: Secondary | ICD-10-CM | POA: Diagnosis not present

## 2021-12-30 DIAGNOSIS — M48062 Spinal stenosis, lumbar region with neurogenic claudication: Secondary | ICD-10-CM | POA: Diagnosis not present

## 2021-12-30 DIAGNOSIS — M5136 Other intervertebral disc degeneration, lumbar region: Secondary | ICD-10-CM | POA: Diagnosis not present

## 2021-12-30 DIAGNOSIS — M4714 Other spondylosis with myelopathy, thoracic region: Secondary | ICD-10-CM | POA: Diagnosis not present

## 2021-12-31 DIAGNOSIS — M545 Low back pain, unspecified: Secondary | ICD-10-CM | POA: Diagnosis not present

## 2021-12-31 DIAGNOSIS — R2689 Other abnormalities of gait and mobility: Secondary | ICD-10-CM | POA: Diagnosis not present

## 2022-01-03 DIAGNOSIS — R2689 Other abnormalities of gait and mobility: Secondary | ICD-10-CM | POA: Diagnosis not present

## 2022-01-03 DIAGNOSIS — M545 Low back pain, unspecified: Secondary | ICD-10-CM | POA: Diagnosis not present

## 2022-01-05 DIAGNOSIS — R2689 Other abnormalities of gait and mobility: Secondary | ICD-10-CM | POA: Diagnosis not present

## 2022-01-05 DIAGNOSIS — M545 Low back pain, unspecified: Secondary | ICD-10-CM | POA: Diagnosis not present

## 2022-01-07 DIAGNOSIS — M545 Low back pain, unspecified: Secondary | ICD-10-CM | POA: Diagnosis not present

## 2022-01-07 DIAGNOSIS — R2689 Other abnormalities of gait and mobility: Secondary | ICD-10-CM | POA: Diagnosis not present

## 2022-01-10 DIAGNOSIS — M545 Low back pain, unspecified: Secondary | ICD-10-CM | POA: Diagnosis not present

## 2022-01-10 DIAGNOSIS — R2689 Other abnormalities of gait and mobility: Secondary | ICD-10-CM | POA: Diagnosis not present

## 2022-01-12 DIAGNOSIS — M545 Low back pain, unspecified: Secondary | ICD-10-CM | POA: Diagnosis not present

## 2022-01-12 DIAGNOSIS — R2689 Other abnormalities of gait and mobility: Secondary | ICD-10-CM | POA: Diagnosis not present

## 2022-01-14 DIAGNOSIS — R2689 Other abnormalities of gait and mobility: Secondary | ICD-10-CM | POA: Diagnosis not present

## 2022-01-14 DIAGNOSIS — M545 Low back pain, unspecified: Secondary | ICD-10-CM | POA: Diagnosis not present

## 2022-01-17 DIAGNOSIS — M545 Low back pain, unspecified: Secondary | ICD-10-CM | POA: Diagnosis not present

## 2022-01-17 DIAGNOSIS — R2689 Other abnormalities of gait and mobility: Secondary | ICD-10-CM | POA: Diagnosis not present

## 2022-01-19 DIAGNOSIS — M545 Low back pain, unspecified: Secondary | ICD-10-CM | POA: Diagnosis not present

## 2022-01-19 DIAGNOSIS — R2689 Other abnormalities of gait and mobility: Secondary | ICD-10-CM | POA: Diagnosis not present

## 2022-01-21 DIAGNOSIS — M545 Low back pain, unspecified: Secondary | ICD-10-CM | POA: Diagnosis not present

## 2022-01-21 DIAGNOSIS — R2689 Other abnormalities of gait and mobility: Secondary | ICD-10-CM | POA: Diagnosis not present

## 2022-01-24 DIAGNOSIS — E1165 Type 2 diabetes mellitus with hyperglycemia: Secondary | ICD-10-CM | POA: Diagnosis not present

## 2022-01-24 DIAGNOSIS — Z299 Encounter for prophylactic measures, unspecified: Secondary | ICD-10-CM | POA: Diagnosis not present

## 2022-01-24 DIAGNOSIS — I10 Essential (primary) hypertension: Secondary | ICD-10-CM | POA: Diagnosis not present

## 2022-01-24 DIAGNOSIS — L8992 Pressure ulcer of unspecified site, stage 2: Secondary | ICD-10-CM | POA: Diagnosis not present

## 2022-01-24 DIAGNOSIS — M459 Ankylosing spondylitis of unspecified sites in spine: Secondary | ICD-10-CM | POA: Diagnosis not present

## 2022-01-25 DIAGNOSIS — I10 Essential (primary) hypertension: Secondary | ICD-10-CM | POA: Diagnosis not present

## 2022-01-25 DIAGNOSIS — R404 Transient alteration of awareness: Secondary | ICD-10-CM | POA: Diagnosis not present

## 2022-01-25 DIAGNOSIS — Z8249 Family history of ischemic heart disease and other diseases of the circulatory system: Secondary | ICD-10-CM | POA: Diagnosis not present

## 2022-01-25 DIAGNOSIS — Z9181 History of falling: Secondary | ICD-10-CM | POA: Diagnosis not present

## 2022-01-25 DIAGNOSIS — G8929 Other chronic pain: Secondary | ICD-10-CM | POA: Diagnosis not present

## 2022-01-25 DIAGNOSIS — Z888 Allergy status to other drugs, medicaments and biological substances status: Secondary | ICD-10-CM | POA: Diagnosis not present

## 2022-01-25 DIAGNOSIS — Z993 Dependence on wheelchair: Secondary | ICD-10-CM | POA: Diagnosis not present

## 2022-01-25 DIAGNOSIS — Z79899 Other long term (current) drug therapy: Secondary | ICD-10-CM | POA: Diagnosis not present

## 2022-01-25 DIAGNOSIS — R410 Disorientation, unspecified: Secondary | ICD-10-CM | POA: Diagnosis not present

## 2022-01-25 DIAGNOSIS — K219 Gastro-esophageal reflux disease without esophagitis: Secondary | ICD-10-CM | POA: Diagnosis not present

## 2022-01-25 DIAGNOSIS — E1140 Type 2 diabetes mellitus with diabetic neuropathy, unspecified: Secondary | ICD-10-CM | POA: Diagnosis not present

## 2022-01-25 DIAGNOSIS — Z885 Allergy status to narcotic agent status: Secondary | ICD-10-CM | POA: Diagnosis not present

## 2022-01-25 DIAGNOSIS — Z7982 Long term (current) use of aspirin: Secondary | ICD-10-CM | POA: Diagnosis not present

## 2022-01-25 DIAGNOSIS — E876 Hypokalemia: Secondary | ICD-10-CM | POA: Diagnosis not present

## 2022-01-25 DIAGNOSIS — I639 Cerebral infarction, unspecified: Secondary | ICD-10-CM | POA: Diagnosis not present

## 2022-01-25 DIAGNOSIS — Z8673 Personal history of transient ischemic attack (TIA), and cerebral infarction without residual deficits: Secondary | ICD-10-CM | POA: Diagnosis not present

## 2022-01-25 DIAGNOSIS — T48201A Poisoning by unspecified drugs acting on muscles, accidental (unintentional), initial encounter: Secondary | ICD-10-CM | POA: Diagnosis not present

## 2022-01-25 DIAGNOSIS — G9341 Metabolic encephalopathy: Secondary | ICD-10-CM | POA: Diagnosis not present

## 2022-01-25 DIAGNOSIS — G928 Other toxic encephalopathy: Secondary | ICD-10-CM | POA: Diagnosis not present

## 2022-01-25 DIAGNOSIS — T402X1A Poisoning by other opioids, accidental (unintentional), initial encounter: Secondary | ICD-10-CM | POA: Diagnosis not present

## 2022-01-25 DIAGNOSIS — E785 Hyperlipidemia, unspecified: Secondary | ICD-10-CM | POA: Diagnosis not present

## 2022-01-25 DIAGNOSIS — Z7984 Long term (current) use of oral hypoglycemic drugs: Secondary | ICD-10-CM | POA: Diagnosis not present

## 2022-01-25 DIAGNOSIS — L89223 Pressure ulcer of left hip, stage 3: Secondary | ICD-10-CM | POA: Diagnosis not present

## 2022-01-31 DIAGNOSIS — L89220 Pressure ulcer of left hip, unstageable: Secondary | ICD-10-CM | POA: Diagnosis not present

## 2022-01-31 DIAGNOSIS — Z79891 Long term (current) use of opiate analgesic: Secondary | ICD-10-CM | POA: Diagnosis not present

## 2022-01-31 DIAGNOSIS — E876 Hypokalemia: Secondary | ICD-10-CM | POA: Diagnosis not present

## 2022-01-31 DIAGNOSIS — E1140 Type 2 diabetes mellitus with diabetic neuropathy, unspecified: Secondary | ICD-10-CM | POA: Diagnosis not present

## 2022-01-31 DIAGNOSIS — Z7984 Long term (current) use of oral hypoglycemic drugs: Secondary | ICD-10-CM | POA: Diagnosis not present

## 2022-01-31 DIAGNOSIS — Z7982 Long term (current) use of aspirin: Secondary | ICD-10-CM | POA: Diagnosis not present

## 2022-01-31 DIAGNOSIS — Z993 Dependence on wheelchair: Secondary | ICD-10-CM | POA: Diagnosis not present

## 2022-01-31 DIAGNOSIS — K219 Gastro-esophageal reflux disease without esophagitis: Secondary | ICD-10-CM | POA: Diagnosis not present

## 2022-01-31 DIAGNOSIS — G8929 Other chronic pain: Secondary | ICD-10-CM | POA: Diagnosis not present

## 2022-01-31 DIAGNOSIS — E785 Hyperlipidemia, unspecified: Secondary | ICD-10-CM | POA: Diagnosis not present

## 2022-01-31 DIAGNOSIS — Z791 Long term (current) use of non-steroidal anti-inflammatories (NSAID): Secondary | ICD-10-CM | POA: Diagnosis not present

## 2022-02-01 DIAGNOSIS — J449 Chronic obstructive pulmonary disease, unspecified: Secondary | ICD-10-CM | POA: Diagnosis not present

## 2022-02-01 DIAGNOSIS — E1165 Type 2 diabetes mellitus with hyperglycemia: Secondary | ICD-10-CM | POA: Diagnosis not present

## 2022-02-01 DIAGNOSIS — M545 Low back pain, unspecified: Secondary | ICD-10-CM | POA: Diagnosis not present

## 2022-02-01 DIAGNOSIS — Z299 Encounter for prophylactic measures, unspecified: Secondary | ICD-10-CM | POA: Diagnosis not present

## 2022-02-01 DIAGNOSIS — I10 Essential (primary) hypertension: Secondary | ICD-10-CM | POA: Diagnosis not present

## 2022-02-02 DIAGNOSIS — Z7984 Long term (current) use of oral hypoglycemic drugs: Secondary | ICD-10-CM | POA: Diagnosis not present

## 2022-02-02 DIAGNOSIS — L89220 Pressure ulcer of left hip, unstageable: Secondary | ICD-10-CM | POA: Diagnosis not present

## 2022-02-02 DIAGNOSIS — Z7982 Long term (current) use of aspirin: Secondary | ICD-10-CM | POA: Diagnosis not present

## 2022-02-02 DIAGNOSIS — E876 Hypokalemia: Secondary | ICD-10-CM | POA: Diagnosis not present

## 2022-02-02 DIAGNOSIS — Z791 Long term (current) use of non-steroidal anti-inflammatories (NSAID): Secondary | ICD-10-CM | POA: Diagnosis not present

## 2022-02-02 DIAGNOSIS — G8929 Other chronic pain: Secondary | ICD-10-CM | POA: Diagnosis not present

## 2022-02-02 DIAGNOSIS — Z993 Dependence on wheelchair: Secondary | ICD-10-CM | POA: Diagnosis not present

## 2022-02-02 DIAGNOSIS — K219 Gastro-esophageal reflux disease without esophagitis: Secondary | ICD-10-CM | POA: Diagnosis not present

## 2022-02-02 DIAGNOSIS — E1140 Type 2 diabetes mellitus with diabetic neuropathy, unspecified: Secondary | ICD-10-CM | POA: Diagnosis not present

## 2022-02-02 DIAGNOSIS — Z79891 Long term (current) use of opiate analgesic: Secondary | ICD-10-CM | POA: Diagnosis not present

## 2022-02-02 DIAGNOSIS — E785 Hyperlipidemia, unspecified: Secondary | ICD-10-CM | POA: Diagnosis not present

## 2022-02-03 DIAGNOSIS — E876 Hypokalemia: Secondary | ICD-10-CM | POA: Diagnosis not present

## 2022-02-03 DIAGNOSIS — L89220 Pressure ulcer of left hip, unstageable: Secondary | ICD-10-CM | POA: Diagnosis not present

## 2022-02-03 DIAGNOSIS — Z79891 Long term (current) use of opiate analgesic: Secondary | ICD-10-CM | POA: Diagnosis not present

## 2022-02-03 DIAGNOSIS — K219 Gastro-esophageal reflux disease without esophagitis: Secondary | ICD-10-CM | POA: Diagnosis not present

## 2022-02-03 DIAGNOSIS — E785 Hyperlipidemia, unspecified: Secondary | ICD-10-CM | POA: Diagnosis not present

## 2022-02-03 DIAGNOSIS — Z7984 Long term (current) use of oral hypoglycemic drugs: Secondary | ICD-10-CM | POA: Diagnosis not present

## 2022-02-03 DIAGNOSIS — E1140 Type 2 diabetes mellitus with diabetic neuropathy, unspecified: Secondary | ICD-10-CM | POA: Diagnosis not present

## 2022-02-03 DIAGNOSIS — Z993 Dependence on wheelchair: Secondary | ICD-10-CM | POA: Diagnosis not present

## 2022-02-03 DIAGNOSIS — Z7982 Long term (current) use of aspirin: Secondary | ICD-10-CM | POA: Diagnosis not present

## 2022-02-03 DIAGNOSIS — Z791 Long term (current) use of non-steroidal anti-inflammatories (NSAID): Secondary | ICD-10-CM | POA: Diagnosis not present

## 2022-02-03 DIAGNOSIS — G8929 Other chronic pain: Secondary | ICD-10-CM | POA: Diagnosis not present

## 2022-02-04 DIAGNOSIS — L89220 Pressure ulcer of left hip, unstageable: Secondary | ICD-10-CM | POA: Diagnosis not present

## 2022-02-04 DIAGNOSIS — Z79891 Long term (current) use of opiate analgesic: Secondary | ICD-10-CM | POA: Diagnosis not present

## 2022-02-04 DIAGNOSIS — Z791 Long term (current) use of non-steroidal anti-inflammatories (NSAID): Secondary | ICD-10-CM | POA: Diagnosis not present

## 2022-02-04 DIAGNOSIS — G8929 Other chronic pain: Secondary | ICD-10-CM | POA: Diagnosis not present

## 2022-02-04 DIAGNOSIS — E876 Hypokalemia: Secondary | ICD-10-CM | POA: Diagnosis not present

## 2022-02-04 DIAGNOSIS — K219 Gastro-esophageal reflux disease without esophagitis: Secondary | ICD-10-CM | POA: Diagnosis not present

## 2022-02-04 DIAGNOSIS — Z7984 Long term (current) use of oral hypoglycemic drugs: Secondary | ICD-10-CM | POA: Diagnosis not present

## 2022-02-04 DIAGNOSIS — E1140 Type 2 diabetes mellitus with diabetic neuropathy, unspecified: Secondary | ICD-10-CM | POA: Diagnosis not present

## 2022-02-04 DIAGNOSIS — Z993 Dependence on wheelchair: Secondary | ICD-10-CM | POA: Diagnosis not present

## 2022-02-04 DIAGNOSIS — E785 Hyperlipidemia, unspecified: Secondary | ICD-10-CM | POA: Diagnosis not present

## 2022-02-04 DIAGNOSIS — Z7982 Long term (current) use of aspirin: Secondary | ICD-10-CM | POA: Diagnosis not present

## 2022-02-08 DIAGNOSIS — K219 Gastro-esophageal reflux disease without esophagitis: Secondary | ICD-10-CM | POA: Diagnosis not present

## 2022-02-08 DIAGNOSIS — Z7984 Long term (current) use of oral hypoglycemic drugs: Secondary | ICD-10-CM | POA: Diagnosis not present

## 2022-02-08 DIAGNOSIS — G8929 Other chronic pain: Secondary | ICD-10-CM | POA: Diagnosis not present

## 2022-02-08 DIAGNOSIS — E876 Hypokalemia: Secondary | ICD-10-CM | POA: Diagnosis not present

## 2022-02-08 DIAGNOSIS — Z993 Dependence on wheelchair: Secondary | ICD-10-CM | POA: Diagnosis not present

## 2022-02-08 DIAGNOSIS — L89220 Pressure ulcer of left hip, unstageable: Secondary | ICD-10-CM | POA: Diagnosis not present

## 2022-02-08 DIAGNOSIS — Z79891 Long term (current) use of opiate analgesic: Secondary | ICD-10-CM | POA: Diagnosis not present

## 2022-02-08 DIAGNOSIS — E785 Hyperlipidemia, unspecified: Secondary | ICD-10-CM | POA: Diagnosis not present

## 2022-02-08 DIAGNOSIS — Z791 Long term (current) use of non-steroidal anti-inflammatories (NSAID): Secondary | ICD-10-CM | POA: Diagnosis not present

## 2022-02-08 DIAGNOSIS — E1140 Type 2 diabetes mellitus with diabetic neuropathy, unspecified: Secondary | ICD-10-CM | POA: Diagnosis not present

## 2022-02-08 DIAGNOSIS — Z7982 Long term (current) use of aspirin: Secondary | ICD-10-CM | POA: Diagnosis not present

## 2022-02-09 DIAGNOSIS — K2100 Gastro-esophageal reflux disease with esophagitis, without bleeding: Secondary | ICD-10-CM | POA: Diagnosis not present

## 2022-02-09 DIAGNOSIS — S71002A Unspecified open wound, left hip, initial encounter: Secondary | ICD-10-CM | POA: Diagnosis not present

## 2022-02-09 DIAGNOSIS — K219 Gastro-esophageal reflux disease without esophagitis: Secondary | ICD-10-CM | POA: Diagnosis not present

## 2022-02-09 DIAGNOSIS — Z87891 Personal history of nicotine dependence: Secondary | ICD-10-CM | POA: Diagnosis not present

## 2022-02-09 DIAGNOSIS — E1142 Type 2 diabetes mellitus with diabetic polyneuropathy: Secondary | ICD-10-CM | POA: Diagnosis not present

## 2022-02-09 DIAGNOSIS — Z79899 Other long term (current) drug therapy: Secondary | ICD-10-CM | POA: Diagnosis not present

## 2022-02-09 DIAGNOSIS — L89229 Pressure ulcer of left hip, unspecified stage: Secondary | ICD-10-CM | POA: Diagnosis not present

## 2022-02-09 DIAGNOSIS — Z885 Allergy status to narcotic agent status: Secondary | ICD-10-CM | POA: Diagnosis not present

## 2022-02-09 DIAGNOSIS — I10 Essential (primary) hypertension: Secondary | ICD-10-CM | POA: Diagnosis not present

## 2022-02-09 DIAGNOSIS — E1165 Type 2 diabetes mellitus with hyperglycemia: Secondary | ICD-10-CM | POA: Diagnosis not present

## 2022-02-09 DIAGNOSIS — E7800 Pure hypercholesterolemia, unspecified: Secondary | ICD-10-CM | POA: Diagnosis not present

## 2022-02-09 DIAGNOSIS — J449 Chronic obstructive pulmonary disease, unspecified: Secondary | ICD-10-CM | POA: Diagnosis not present

## 2022-02-09 DIAGNOSIS — Z7984 Long term (current) use of oral hypoglycemic drugs: Secondary | ICD-10-CM | POA: Diagnosis not present

## 2022-02-10 DIAGNOSIS — Z791 Long term (current) use of non-steroidal anti-inflammatories (NSAID): Secondary | ICD-10-CM | POA: Diagnosis not present

## 2022-02-10 DIAGNOSIS — E876 Hypokalemia: Secondary | ICD-10-CM | POA: Diagnosis not present

## 2022-02-10 DIAGNOSIS — E1140 Type 2 diabetes mellitus with diabetic neuropathy, unspecified: Secondary | ICD-10-CM | POA: Diagnosis not present

## 2022-02-10 DIAGNOSIS — L89220 Pressure ulcer of left hip, unstageable: Secondary | ICD-10-CM | POA: Diagnosis not present

## 2022-02-10 DIAGNOSIS — Z79891 Long term (current) use of opiate analgesic: Secondary | ICD-10-CM | POA: Diagnosis not present

## 2022-02-10 DIAGNOSIS — E785 Hyperlipidemia, unspecified: Secondary | ICD-10-CM | POA: Diagnosis not present

## 2022-02-10 DIAGNOSIS — Z7984 Long term (current) use of oral hypoglycemic drugs: Secondary | ICD-10-CM | POA: Diagnosis not present

## 2022-02-10 DIAGNOSIS — Z7982 Long term (current) use of aspirin: Secondary | ICD-10-CM | POA: Diagnosis not present

## 2022-02-10 DIAGNOSIS — K219 Gastro-esophageal reflux disease without esophagitis: Secondary | ICD-10-CM | POA: Diagnosis not present

## 2022-02-10 DIAGNOSIS — G8929 Other chronic pain: Secondary | ICD-10-CM | POA: Diagnosis not present

## 2022-02-10 DIAGNOSIS — Z993 Dependence on wheelchair: Secondary | ICD-10-CM | POA: Diagnosis not present

## 2022-02-11 DIAGNOSIS — E785 Hyperlipidemia, unspecified: Secondary | ICD-10-CM | POA: Diagnosis not present

## 2022-02-11 DIAGNOSIS — G8929 Other chronic pain: Secondary | ICD-10-CM | POA: Diagnosis not present

## 2022-02-11 DIAGNOSIS — Z79891 Long term (current) use of opiate analgesic: Secondary | ICD-10-CM | POA: Diagnosis not present

## 2022-02-11 DIAGNOSIS — E1140 Type 2 diabetes mellitus with diabetic neuropathy, unspecified: Secondary | ICD-10-CM | POA: Diagnosis not present

## 2022-02-11 DIAGNOSIS — Z7982 Long term (current) use of aspirin: Secondary | ICD-10-CM | POA: Diagnosis not present

## 2022-02-11 DIAGNOSIS — L89220 Pressure ulcer of left hip, unstageable: Secondary | ICD-10-CM | POA: Diagnosis not present

## 2022-02-11 DIAGNOSIS — E876 Hypokalemia: Secondary | ICD-10-CM | POA: Diagnosis not present

## 2022-02-11 DIAGNOSIS — K219 Gastro-esophageal reflux disease without esophagitis: Secondary | ICD-10-CM | POA: Diagnosis not present

## 2022-02-11 DIAGNOSIS — Z791 Long term (current) use of non-steroidal anti-inflammatories (NSAID): Secondary | ICD-10-CM | POA: Diagnosis not present

## 2022-02-11 DIAGNOSIS — Z993 Dependence on wheelchair: Secondary | ICD-10-CM | POA: Diagnosis not present

## 2022-02-11 DIAGNOSIS — Z7984 Long term (current) use of oral hypoglycemic drugs: Secondary | ICD-10-CM | POA: Diagnosis not present

## 2022-02-12 DIAGNOSIS — Z79891 Long term (current) use of opiate analgesic: Secondary | ICD-10-CM | POA: Diagnosis not present

## 2022-02-12 DIAGNOSIS — K219 Gastro-esophageal reflux disease without esophagitis: Secondary | ICD-10-CM | POA: Diagnosis not present

## 2022-02-12 DIAGNOSIS — Z7984 Long term (current) use of oral hypoglycemic drugs: Secondary | ICD-10-CM | POA: Diagnosis not present

## 2022-02-12 DIAGNOSIS — G8929 Other chronic pain: Secondary | ICD-10-CM | POA: Diagnosis not present

## 2022-02-12 DIAGNOSIS — E1140 Type 2 diabetes mellitus with diabetic neuropathy, unspecified: Secondary | ICD-10-CM | POA: Diagnosis not present

## 2022-02-12 DIAGNOSIS — E876 Hypokalemia: Secondary | ICD-10-CM | POA: Diagnosis not present

## 2022-02-12 DIAGNOSIS — L89220 Pressure ulcer of left hip, unstageable: Secondary | ICD-10-CM | POA: Diagnosis not present

## 2022-02-12 DIAGNOSIS — Z791 Long term (current) use of non-steroidal anti-inflammatories (NSAID): Secondary | ICD-10-CM | POA: Diagnosis not present

## 2022-02-12 DIAGNOSIS — Z993 Dependence on wheelchair: Secondary | ICD-10-CM | POA: Diagnosis not present

## 2022-02-12 DIAGNOSIS — Z7982 Long term (current) use of aspirin: Secondary | ICD-10-CM | POA: Diagnosis not present

## 2022-02-12 DIAGNOSIS — E785 Hyperlipidemia, unspecified: Secondary | ICD-10-CM | POA: Diagnosis not present

## 2022-02-14 DIAGNOSIS — G9341 Metabolic encephalopathy: Secondary | ICD-10-CM | POA: Diagnosis not present

## 2022-02-15 DIAGNOSIS — L89220 Pressure ulcer of left hip, unstageable: Secondary | ICD-10-CM | POA: Diagnosis not present

## 2022-02-15 DIAGNOSIS — Z791 Long term (current) use of non-steroidal anti-inflammatories (NSAID): Secondary | ICD-10-CM | POA: Diagnosis not present

## 2022-02-15 DIAGNOSIS — E785 Hyperlipidemia, unspecified: Secondary | ICD-10-CM | POA: Diagnosis not present

## 2022-02-15 DIAGNOSIS — E876 Hypokalemia: Secondary | ICD-10-CM | POA: Diagnosis not present

## 2022-02-15 DIAGNOSIS — Z993 Dependence on wheelchair: Secondary | ICD-10-CM | POA: Diagnosis not present

## 2022-02-15 DIAGNOSIS — K219 Gastro-esophageal reflux disease without esophagitis: Secondary | ICD-10-CM | POA: Diagnosis not present

## 2022-02-15 DIAGNOSIS — Z79891 Long term (current) use of opiate analgesic: Secondary | ICD-10-CM | POA: Diagnosis not present

## 2022-02-15 DIAGNOSIS — Z7984 Long term (current) use of oral hypoglycemic drugs: Secondary | ICD-10-CM | POA: Diagnosis not present

## 2022-02-15 DIAGNOSIS — G8929 Other chronic pain: Secondary | ICD-10-CM | POA: Diagnosis not present

## 2022-02-15 DIAGNOSIS — Z7982 Long term (current) use of aspirin: Secondary | ICD-10-CM | POA: Diagnosis not present

## 2022-02-15 DIAGNOSIS — E1140 Type 2 diabetes mellitus with diabetic neuropathy, unspecified: Secondary | ICD-10-CM | POA: Diagnosis not present

## 2022-02-16 DIAGNOSIS — K2100 Gastro-esophageal reflux disease with esophagitis, without bleeding: Secondary | ICD-10-CM | POA: Diagnosis not present

## 2022-02-16 DIAGNOSIS — L89229 Pressure ulcer of left hip, unspecified stage: Secondary | ICD-10-CM | POA: Diagnosis not present

## 2022-02-16 DIAGNOSIS — Z87891 Personal history of nicotine dependence: Secondary | ICD-10-CM | POA: Diagnosis not present

## 2022-02-16 DIAGNOSIS — Z885 Allergy status to narcotic agent status: Secondary | ICD-10-CM | POA: Diagnosis not present

## 2022-02-16 DIAGNOSIS — I10 Essential (primary) hypertension: Secondary | ICD-10-CM | POA: Diagnosis not present

## 2022-02-16 DIAGNOSIS — J449 Chronic obstructive pulmonary disease, unspecified: Secondary | ICD-10-CM | POA: Diagnosis not present

## 2022-02-16 DIAGNOSIS — L89220 Pressure ulcer of left hip, unstageable: Secondary | ICD-10-CM | POA: Diagnosis not present

## 2022-02-16 DIAGNOSIS — E7800 Pure hypercholesterolemia, unspecified: Secondary | ICD-10-CM | POA: Diagnosis not present

## 2022-02-16 DIAGNOSIS — Z7984 Long term (current) use of oral hypoglycemic drugs: Secondary | ICD-10-CM | POA: Diagnosis not present

## 2022-02-16 DIAGNOSIS — E1142 Type 2 diabetes mellitus with diabetic polyneuropathy: Secondary | ICD-10-CM | POA: Diagnosis not present

## 2022-02-16 DIAGNOSIS — S71002A Unspecified open wound, left hip, initial encounter: Secondary | ICD-10-CM | POA: Diagnosis not present

## 2022-02-16 DIAGNOSIS — Z79899 Other long term (current) drug therapy: Secondary | ICD-10-CM | POA: Diagnosis not present

## 2022-02-17 DIAGNOSIS — Z993 Dependence on wheelchair: Secondary | ICD-10-CM | POA: Diagnosis not present

## 2022-02-17 DIAGNOSIS — E785 Hyperlipidemia, unspecified: Secondary | ICD-10-CM | POA: Diagnosis not present

## 2022-02-17 DIAGNOSIS — Z79891 Long term (current) use of opiate analgesic: Secondary | ICD-10-CM | POA: Diagnosis not present

## 2022-02-17 DIAGNOSIS — E1140 Type 2 diabetes mellitus with diabetic neuropathy, unspecified: Secondary | ICD-10-CM | POA: Diagnosis not present

## 2022-02-17 DIAGNOSIS — K219 Gastro-esophageal reflux disease without esophagitis: Secondary | ICD-10-CM | POA: Diagnosis not present

## 2022-02-17 DIAGNOSIS — Z7984 Long term (current) use of oral hypoglycemic drugs: Secondary | ICD-10-CM | POA: Diagnosis not present

## 2022-02-17 DIAGNOSIS — G8929 Other chronic pain: Secondary | ICD-10-CM | POA: Diagnosis not present

## 2022-02-17 DIAGNOSIS — E876 Hypokalemia: Secondary | ICD-10-CM | POA: Diagnosis not present

## 2022-02-17 DIAGNOSIS — L89220 Pressure ulcer of left hip, unstageable: Secondary | ICD-10-CM | POA: Diagnosis not present

## 2022-02-17 DIAGNOSIS — Z791 Long term (current) use of non-steroidal anti-inflammatories (NSAID): Secondary | ICD-10-CM | POA: Diagnosis not present

## 2022-02-17 DIAGNOSIS — Z7982 Long term (current) use of aspirin: Secondary | ICD-10-CM | POA: Diagnosis not present

## 2022-02-18 DIAGNOSIS — Z79891 Long term (current) use of opiate analgesic: Secondary | ICD-10-CM | POA: Diagnosis not present

## 2022-02-18 DIAGNOSIS — L89220 Pressure ulcer of left hip, unstageable: Secondary | ICD-10-CM | POA: Diagnosis not present

## 2022-02-18 DIAGNOSIS — G8929 Other chronic pain: Secondary | ICD-10-CM | POA: Diagnosis not present

## 2022-02-18 DIAGNOSIS — Z7982 Long term (current) use of aspirin: Secondary | ICD-10-CM | POA: Diagnosis not present

## 2022-02-18 DIAGNOSIS — E785 Hyperlipidemia, unspecified: Secondary | ICD-10-CM | POA: Diagnosis not present

## 2022-02-18 DIAGNOSIS — Z993 Dependence on wheelchair: Secondary | ICD-10-CM | POA: Diagnosis not present

## 2022-02-18 DIAGNOSIS — E1140 Type 2 diabetes mellitus with diabetic neuropathy, unspecified: Secondary | ICD-10-CM | POA: Diagnosis not present

## 2022-02-18 DIAGNOSIS — Z7984 Long term (current) use of oral hypoglycemic drugs: Secondary | ICD-10-CM | POA: Diagnosis not present

## 2022-02-18 DIAGNOSIS — E876 Hypokalemia: Secondary | ICD-10-CM | POA: Diagnosis not present

## 2022-02-18 DIAGNOSIS — K219 Gastro-esophageal reflux disease without esophagitis: Secondary | ICD-10-CM | POA: Diagnosis not present

## 2022-02-18 DIAGNOSIS — Z791 Long term (current) use of non-steroidal anti-inflammatories (NSAID): Secondary | ICD-10-CM | POA: Diagnosis not present

## 2022-02-22 DIAGNOSIS — Z7984 Long term (current) use of oral hypoglycemic drugs: Secondary | ICD-10-CM | POA: Diagnosis not present

## 2022-02-22 DIAGNOSIS — E785 Hyperlipidemia, unspecified: Secondary | ICD-10-CM | POA: Diagnosis not present

## 2022-02-22 DIAGNOSIS — K219 Gastro-esophageal reflux disease without esophagitis: Secondary | ICD-10-CM | POA: Diagnosis not present

## 2022-02-22 DIAGNOSIS — Z791 Long term (current) use of non-steroidal anti-inflammatories (NSAID): Secondary | ICD-10-CM | POA: Diagnosis not present

## 2022-02-22 DIAGNOSIS — G8929 Other chronic pain: Secondary | ICD-10-CM | POA: Diagnosis not present

## 2022-02-22 DIAGNOSIS — L89220 Pressure ulcer of left hip, unstageable: Secondary | ICD-10-CM | POA: Diagnosis not present

## 2022-02-22 DIAGNOSIS — Z7982 Long term (current) use of aspirin: Secondary | ICD-10-CM | POA: Diagnosis not present

## 2022-02-22 DIAGNOSIS — E876 Hypokalemia: Secondary | ICD-10-CM | POA: Diagnosis not present

## 2022-02-22 DIAGNOSIS — E1140 Type 2 diabetes mellitus with diabetic neuropathy, unspecified: Secondary | ICD-10-CM | POA: Diagnosis not present

## 2022-02-22 DIAGNOSIS — Z79891 Long term (current) use of opiate analgesic: Secondary | ICD-10-CM | POA: Diagnosis not present

## 2022-02-22 DIAGNOSIS — Z993 Dependence on wheelchair: Secondary | ICD-10-CM | POA: Diagnosis not present

## 2022-02-24 DIAGNOSIS — E876 Hypokalemia: Secondary | ICD-10-CM | POA: Diagnosis not present

## 2022-02-24 DIAGNOSIS — E785 Hyperlipidemia, unspecified: Secondary | ICD-10-CM | POA: Diagnosis not present

## 2022-02-24 DIAGNOSIS — Z993 Dependence on wheelchair: Secondary | ICD-10-CM | POA: Diagnosis not present

## 2022-02-24 DIAGNOSIS — Z791 Long term (current) use of non-steroidal anti-inflammatories (NSAID): Secondary | ICD-10-CM | POA: Diagnosis not present

## 2022-02-24 DIAGNOSIS — E1140 Type 2 diabetes mellitus with diabetic neuropathy, unspecified: Secondary | ICD-10-CM | POA: Diagnosis not present

## 2022-02-24 DIAGNOSIS — L89220 Pressure ulcer of left hip, unstageable: Secondary | ICD-10-CM | POA: Diagnosis not present

## 2022-02-24 DIAGNOSIS — G8929 Other chronic pain: Secondary | ICD-10-CM | POA: Diagnosis not present

## 2022-02-24 DIAGNOSIS — Z7982 Long term (current) use of aspirin: Secondary | ICD-10-CM | POA: Diagnosis not present

## 2022-02-24 DIAGNOSIS — K219 Gastro-esophageal reflux disease without esophagitis: Secondary | ICD-10-CM | POA: Diagnosis not present

## 2022-02-24 DIAGNOSIS — Z79891 Long term (current) use of opiate analgesic: Secondary | ICD-10-CM | POA: Diagnosis not present

## 2022-02-24 DIAGNOSIS — Z7984 Long term (current) use of oral hypoglycemic drugs: Secondary | ICD-10-CM | POA: Diagnosis not present

## 2022-02-26 DIAGNOSIS — G8929 Other chronic pain: Secondary | ICD-10-CM | POA: Diagnosis not present

## 2022-02-26 DIAGNOSIS — Z791 Long term (current) use of non-steroidal anti-inflammatories (NSAID): Secondary | ICD-10-CM | POA: Diagnosis not present

## 2022-02-26 DIAGNOSIS — Z7984 Long term (current) use of oral hypoglycemic drugs: Secondary | ICD-10-CM | POA: Diagnosis not present

## 2022-02-26 DIAGNOSIS — K219 Gastro-esophageal reflux disease without esophagitis: Secondary | ICD-10-CM | POA: Diagnosis not present

## 2022-02-26 DIAGNOSIS — Z79891 Long term (current) use of opiate analgesic: Secondary | ICD-10-CM | POA: Diagnosis not present

## 2022-02-26 DIAGNOSIS — L89220 Pressure ulcer of left hip, unstageable: Secondary | ICD-10-CM | POA: Diagnosis not present

## 2022-02-26 DIAGNOSIS — Z993 Dependence on wheelchair: Secondary | ICD-10-CM | POA: Diagnosis not present

## 2022-02-26 DIAGNOSIS — E876 Hypokalemia: Secondary | ICD-10-CM | POA: Diagnosis not present

## 2022-02-26 DIAGNOSIS — E785 Hyperlipidemia, unspecified: Secondary | ICD-10-CM | POA: Diagnosis not present

## 2022-02-26 DIAGNOSIS — Z7982 Long term (current) use of aspirin: Secondary | ICD-10-CM | POA: Diagnosis not present

## 2022-02-26 DIAGNOSIS — E1140 Type 2 diabetes mellitus with diabetic neuropathy, unspecified: Secondary | ICD-10-CM | POA: Diagnosis not present

## 2022-02-26 IMAGING — US US EXTREM LOW VENOUS
1 series · 13 of 24 positions shown · non-contrast
Comparison: None.

CLINICAL DATA: 69-year-old male with dyspnea on exertion



[Series 1: us venous img lower bilat (dvt) · portal-venous · 13 of 81 slices shown]
[im 1/81]
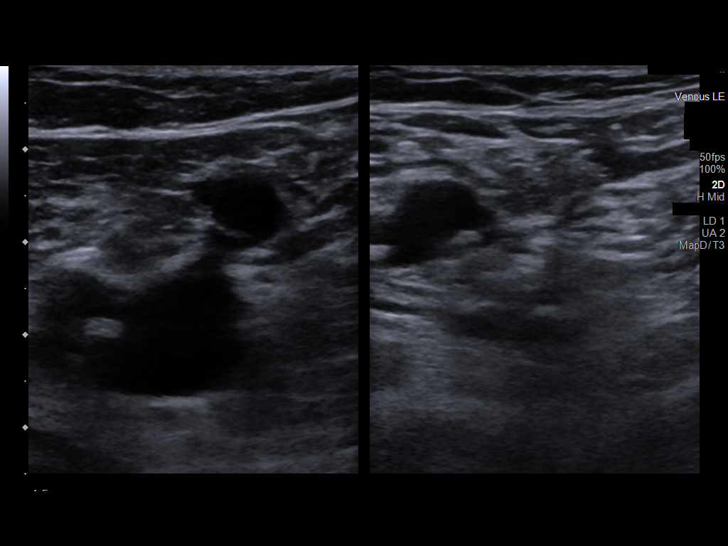
[im 7/81]
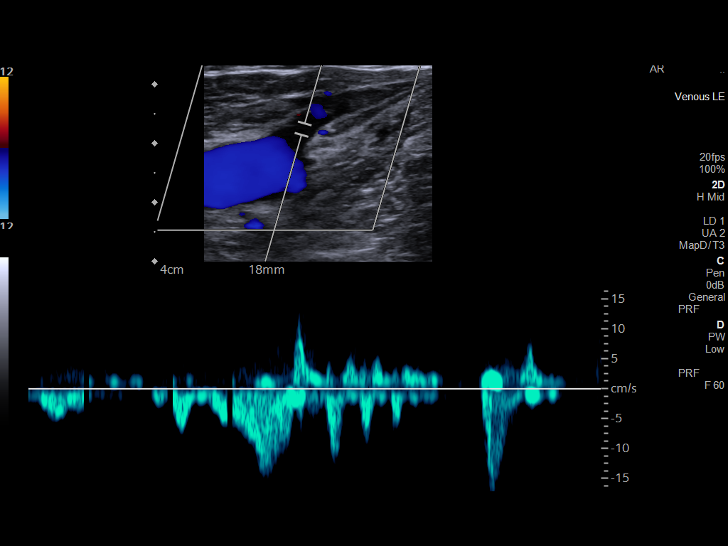
[im 14/81]
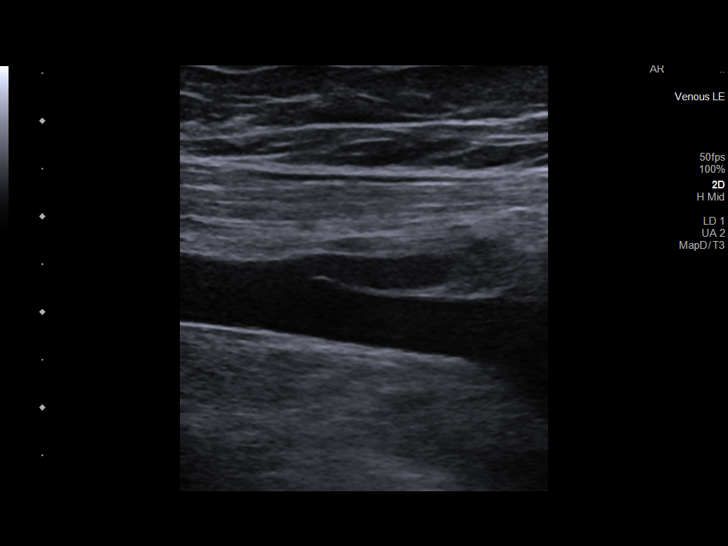
[im 21/81]
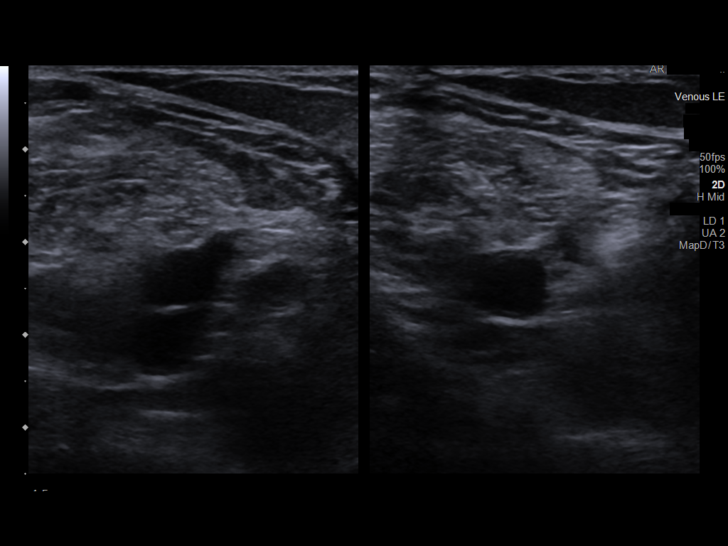
[im 28/81]
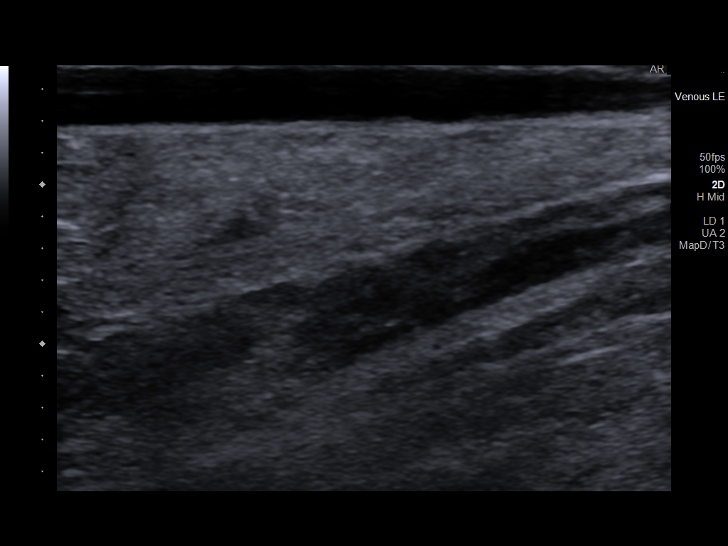
[im 35/81]
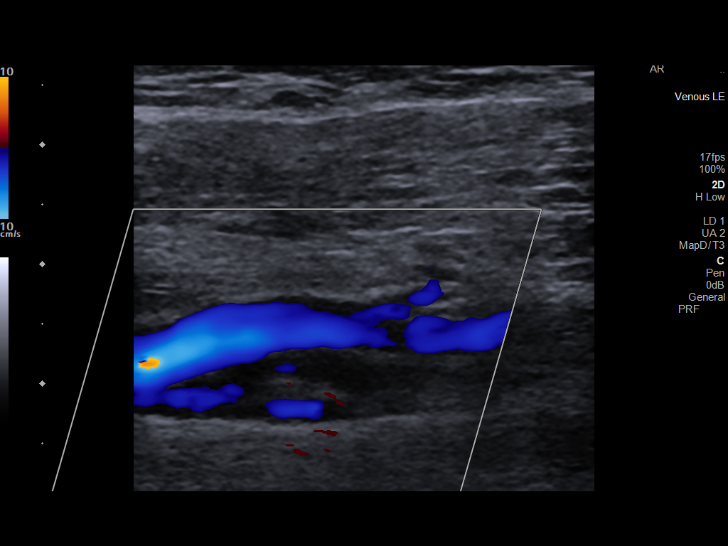
[im 42/81]
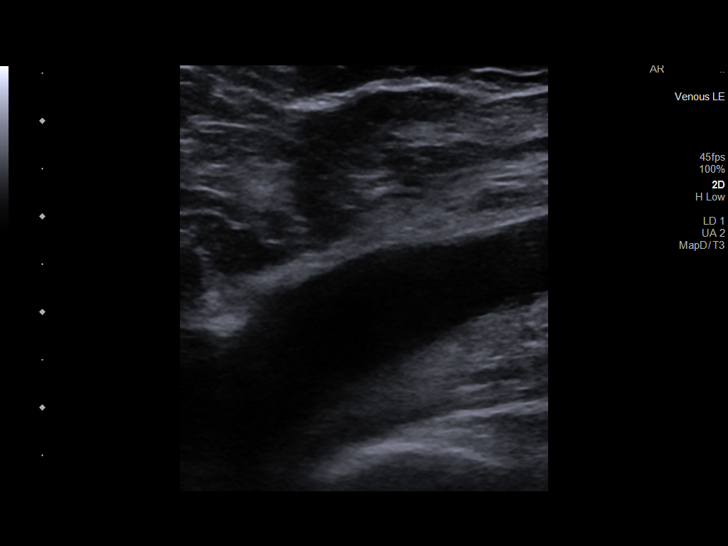
[im 46/81]
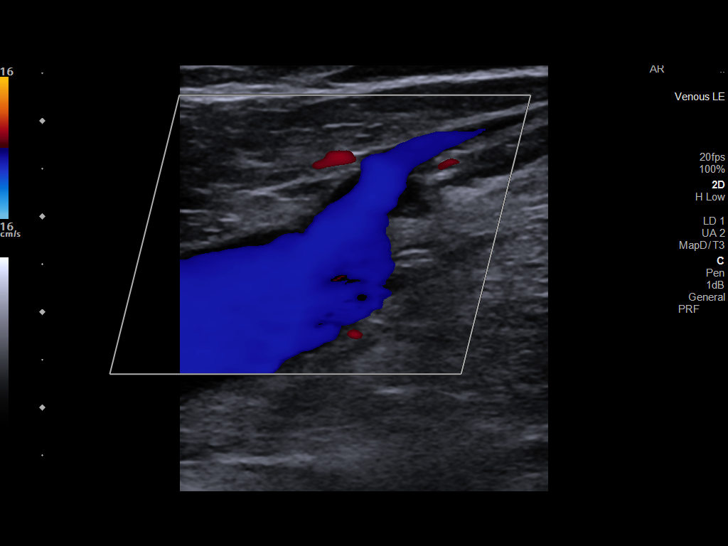
[im 53/81]
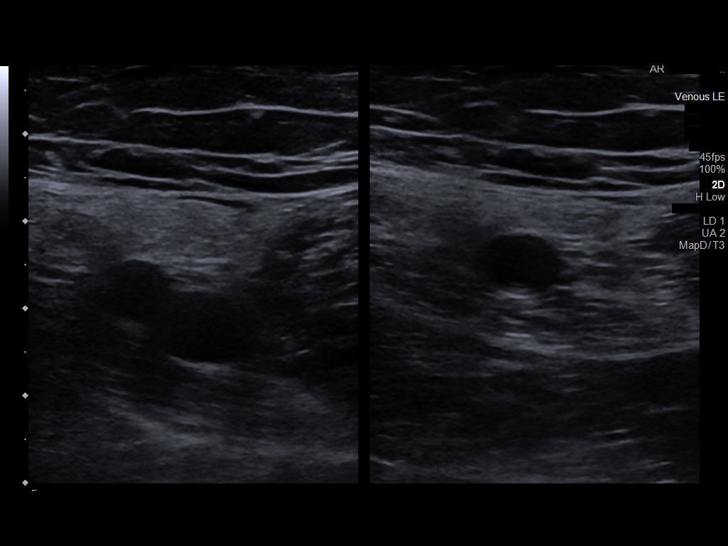
[im 60/81]
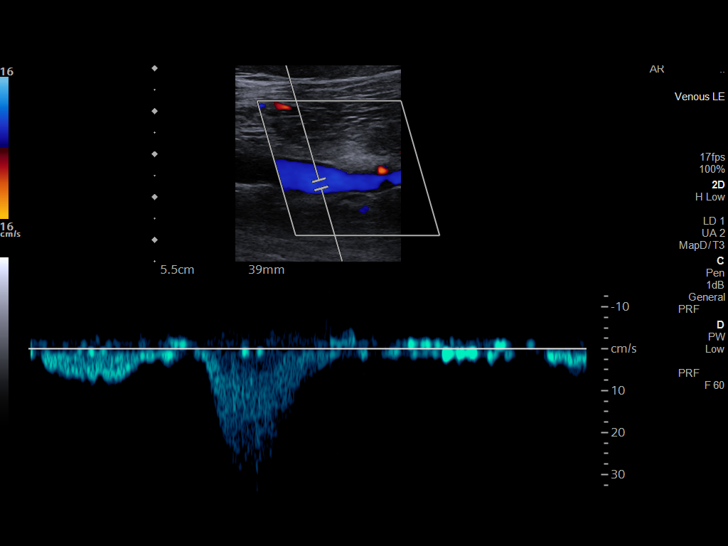
[im 67/81]
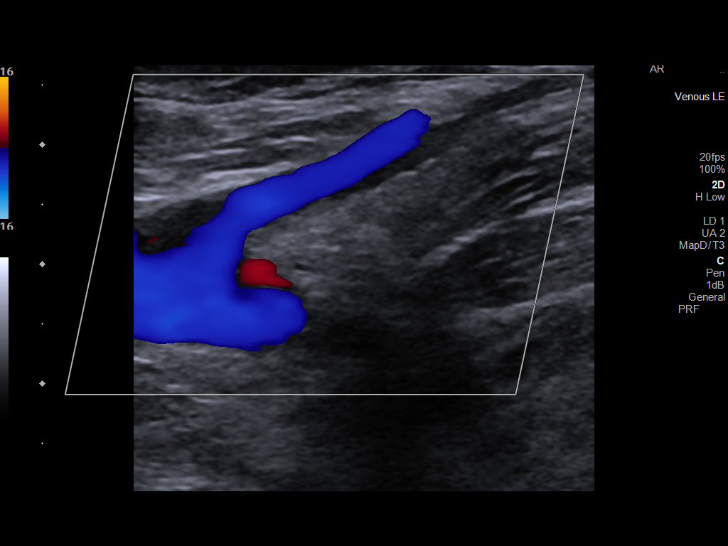
[im 74/81]
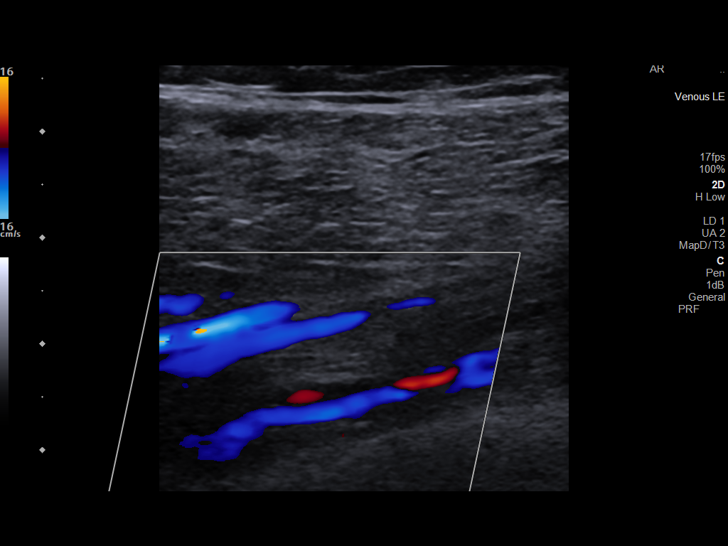
[im 81/81]
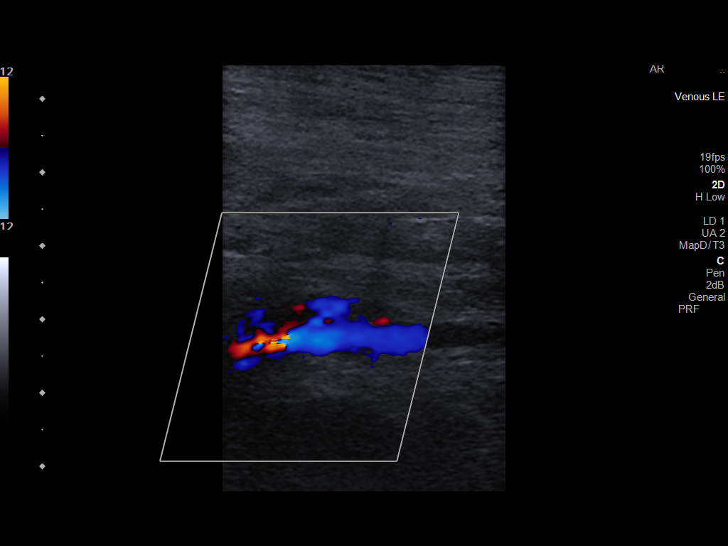

[13 of 24 positions shown; findings below may reference images not displayed]

FINDINGS: RIGHT LOWER EXTREMITY

Common Femoral Vein: No evidence of thrombus. Normal
compressibility, respiratory phasicity and response to augmentation.

Saphenofemoral Junction: No evidence of thrombus. Normal
compressibility and flow on color Doppler imaging.

Profunda Femoral Vein: No evidence of thrombus. Normal
compressibility and flow on color Doppler imaging.

Femoral Vein: No evidence of thrombus. Normal compressibility,
respiratory phasicity and response to augmentation.

Popliteal Vein: No evidence of thrombus. Normal compressibility,
respiratory phasicity and response to augmentation.

Calf Veins: No evidence of thrombus. Normal compressibility and flow
on color Doppler imaging.

Superficial Great Saphenous Vein: No evidence of thrombus. Normal
compressibility and flow on color Doppler imaging.

Other Findings:  None.

LEFT LOWER EXTREMITY

Common Femoral Vein: No evidence of thrombus. Normal
compressibility, respiratory phasicity and response to augmentation.

Saphenofemoral Junction: No evidence of thrombus. Normal
compressibility and flow on color Doppler imaging.

Profunda Femoral Vein: No evidence of thrombus. Normal
compressibility and flow on color Doppler imaging.

Femoral Vein: No evidence of thrombus. Normal compressibility,
respiratory phasicity and response to augmentation.

Popliteal Vein: No evidence of thrombus. Normal compressibility,
respiratory phasicity and response to augmentation.

Calf Veins: No evidence of thrombus. Normal compressibility and flow
on color Doppler imaging.

Superficial Great Saphenous Vein: No evidence of thrombus. Normal
compressibility and flow on color Doppler imaging.

Other Findings:  None.
IMPRESSION: Sonographic survey of the bilateral lower extremities negative for
DVT

## 2022-02-28 DIAGNOSIS — Z993 Dependence on wheelchair: Secondary | ICD-10-CM | POA: Diagnosis not present

## 2022-02-28 DIAGNOSIS — Z7982 Long term (current) use of aspirin: Secondary | ICD-10-CM | POA: Diagnosis not present

## 2022-02-28 DIAGNOSIS — K219 Gastro-esophageal reflux disease without esophagitis: Secondary | ICD-10-CM | POA: Diagnosis not present

## 2022-02-28 DIAGNOSIS — E1140 Type 2 diabetes mellitus with diabetic neuropathy, unspecified: Secondary | ICD-10-CM | POA: Diagnosis not present

## 2022-02-28 DIAGNOSIS — Z7984 Long term (current) use of oral hypoglycemic drugs: Secondary | ICD-10-CM | POA: Diagnosis not present

## 2022-02-28 DIAGNOSIS — G8929 Other chronic pain: Secondary | ICD-10-CM | POA: Diagnosis not present

## 2022-02-28 DIAGNOSIS — Z791 Long term (current) use of non-steroidal anti-inflammatories (NSAID): Secondary | ICD-10-CM | POA: Diagnosis not present

## 2022-02-28 DIAGNOSIS — L89220 Pressure ulcer of left hip, unstageable: Secondary | ICD-10-CM | POA: Diagnosis not present

## 2022-02-28 DIAGNOSIS — E785 Hyperlipidemia, unspecified: Secondary | ICD-10-CM | POA: Diagnosis not present

## 2022-02-28 DIAGNOSIS — Z79891 Long term (current) use of opiate analgesic: Secondary | ICD-10-CM | POA: Diagnosis not present

## 2022-02-28 DIAGNOSIS — E876 Hypokalemia: Secondary | ICD-10-CM | POA: Diagnosis not present

## 2022-03-02 DIAGNOSIS — E7800 Pure hypercholesterolemia, unspecified: Secondary | ICD-10-CM | POA: Diagnosis not present

## 2022-03-02 DIAGNOSIS — Z87891 Personal history of nicotine dependence: Secondary | ICD-10-CM | POA: Diagnosis not present

## 2022-03-02 DIAGNOSIS — I10 Essential (primary) hypertension: Secondary | ICD-10-CM | POA: Diagnosis not present

## 2022-03-02 DIAGNOSIS — E1142 Type 2 diabetes mellitus with diabetic polyneuropathy: Secondary | ICD-10-CM | POA: Diagnosis not present

## 2022-03-02 DIAGNOSIS — J449 Chronic obstructive pulmonary disease, unspecified: Secondary | ICD-10-CM | POA: Diagnosis not present

## 2022-03-02 DIAGNOSIS — S71002A Unspecified open wound, left hip, initial encounter: Secondary | ICD-10-CM | POA: Diagnosis not present

## 2022-03-02 DIAGNOSIS — Z885 Allergy status to narcotic agent status: Secondary | ICD-10-CM | POA: Diagnosis not present

## 2022-03-02 DIAGNOSIS — K2100 Gastro-esophageal reflux disease with esophagitis, without bleeding: Secondary | ICD-10-CM | POA: Diagnosis not present

## 2022-03-02 DIAGNOSIS — Z79899 Other long term (current) drug therapy: Secondary | ICD-10-CM | POA: Diagnosis not present

## 2022-03-02 DIAGNOSIS — Z7984 Long term (current) use of oral hypoglycemic drugs: Secondary | ICD-10-CM | POA: Diagnosis not present

## 2022-03-02 DIAGNOSIS — L89229 Pressure ulcer of left hip, unspecified stage: Secondary | ICD-10-CM | POA: Diagnosis not present

## 2022-03-02 DIAGNOSIS — L89222 Pressure ulcer of left hip, stage 2: Secondary | ICD-10-CM | POA: Diagnosis not present

## 2022-03-03 DIAGNOSIS — Z993 Dependence on wheelchair: Secondary | ICD-10-CM | POA: Diagnosis not present

## 2022-03-03 DIAGNOSIS — Z7982 Long term (current) use of aspirin: Secondary | ICD-10-CM | POA: Diagnosis not present

## 2022-03-03 DIAGNOSIS — Z7984 Long term (current) use of oral hypoglycemic drugs: Secondary | ICD-10-CM | POA: Diagnosis not present

## 2022-03-03 DIAGNOSIS — K219 Gastro-esophageal reflux disease without esophagitis: Secondary | ICD-10-CM | POA: Diagnosis not present

## 2022-03-03 DIAGNOSIS — Z791 Long term (current) use of non-steroidal anti-inflammatories (NSAID): Secondary | ICD-10-CM | POA: Diagnosis not present

## 2022-03-03 DIAGNOSIS — G8929 Other chronic pain: Secondary | ICD-10-CM | POA: Diagnosis not present

## 2022-03-03 DIAGNOSIS — E1140 Type 2 diabetes mellitus with diabetic neuropathy, unspecified: Secondary | ICD-10-CM | POA: Diagnosis not present

## 2022-03-03 DIAGNOSIS — Z79891 Long term (current) use of opiate analgesic: Secondary | ICD-10-CM | POA: Diagnosis not present

## 2022-03-03 DIAGNOSIS — E876 Hypokalemia: Secondary | ICD-10-CM | POA: Diagnosis not present

## 2022-03-03 DIAGNOSIS — L89220 Pressure ulcer of left hip, unstageable: Secondary | ICD-10-CM | POA: Diagnosis not present

## 2022-03-03 DIAGNOSIS — E785 Hyperlipidemia, unspecified: Secondary | ICD-10-CM | POA: Diagnosis not present

## 2022-03-08 DIAGNOSIS — Z993 Dependence on wheelchair: Secondary | ICD-10-CM | POA: Diagnosis not present

## 2022-03-08 DIAGNOSIS — E876 Hypokalemia: Secondary | ICD-10-CM | POA: Diagnosis not present

## 2022-03-08 DIAGNOSIS — Z7984 Long term (current) use of oral hypoglycemic drugs: Secondary | ICD-10-CM | POA: Diagnosis not present

## 2022-03-08 DIAGNOSIS — Z791 Long term (current) use of non-steroidal anti-inflammatories (NSAID): Secondary | ICD-10-CM | POA: Diagnosis not present

## 2022-03-08 DIAGNOSIS — Z7982 Long term (current) use of aspirin: Secondary | ICD-10-CM | POA: Diagnosis not present

## 2022-03-08 DIAGNOSIS — G8929 Other chronic pain: Secondary | ICD-10-CM | POA: Diagnosis not present

## 2022-03-08 DIAGNOSIS — L89220 Pressure ulcer of left hip, unstageable: Secondary | ICD-10-CM | POA: Diagnosis not present

## 2022-03-08 DIAGNOSIS — Z79891 Long term (current) use of opiate analgesic: Secondary | ICD-10-CM | POA: Diagnosis not present

## 2022-03-08 DIAGNOSIS — E785 Hyperlipidemia, unspecified: Secondary | ICD-10-CM | POA: Diagnosis not present

## 2022-03-08 DIAGNOSIS — K219 Gastro-esophageal reflux disease without esophagitis: Secondary | ICD-10-CM | POA: Diagnosis not present

## 2022-03-08 DIAGNOSIS — E1140 Type 2 diabetes mellitus with diabetic neuropathy, unspecified: Secondary | ICD-10-CM | POA: Diagnosis not present

## 2022-03-09 DIAGNOSIS — Z7984 Long term (current) use of oral hypoglycemic drugs: Secondary | ICD-10-CM | POA: Diagnosis not present

## 2022-03-09 DIAGNOSIS — Z993 Dependence on wheelchair: Secondary | ICD-10-CM | POA: Diagnosis not present

## 2022-03-09 DIAGNOSIS — Z79891 Long term (current) use of opiate analgesic: Secondary | ICD-10-CM | POA: Diagnosis not present

## 2022-03-09 DIAGNOSIS — E785 Hyperlipidemia, unspecified: Secondary | ICD-10-CM | POA: Diagnosis not present

## 2022-03-09 DIAGNOSIS — L89220 Pressure ulcer of left hip, unstageable: Secondary | ICD-10-CM | POA: Diagnosis not present

## 2022-03-09 DIAGNOSIS — E876 Hypokalemia: Secondary | ICD-10-CM | POA: Diagnosis not present

## 2022-03-09 DIAGNOSIS — G8929 Other chronic pain: Secondary | ICD-10-CM | POA: Diagnosis not present

## 2022-03-09 DIAGNOSIS — K219 Gastro-esophageal reflux disease without esophagitis: Secondary | ICD-10-CM | POA: Diagnosis not present

## 2022-03-09 DIAGNOSIS — E1140 Type 2 diabetes mellitus with diabetic neuropathy, unspecified: Secondary | ICD-10-CM | POA: Diagnosis not present

## 2022-03-09 DIAGNOSIS — Z791 Long term (current) use of non-steroidal anti-inflammatories (NSAID): Secondary | ICD-10-CM | POA: Diagnosis not present

## 2022-03-09 DIAGNOSIS — Z7982 Long term (current) use of aspirin: Secondary | ICD-10-CM | POA: Diagnosis not present

## 2022-03-16 DIAGNOSIS — E1165 Type 2 diabetes mellitus with hyperglycemia: Secondary | ICD-10-CM | POA: Diagnosis not present

## 2022-03-16 DIAGNOSIS — G9341 Metabolic encephalopathy: Secondary | ICD-10-CM | POA: Diagnosis not present

## 2022-03-16 DIAGNOSIS — Z885 Allergy status to narcotic agent status: Secondary | ICD-10-CM | POA: Diagnosis not present

## 2022-03-16 DIAGNOSIS — M545 Low back pain, unspecified: Secondary | ICD-10-CM | POA: Diagnosis not present

## 2022-03-16 DIAGNOSIS — I10 Essential (primary) hypertension: Secondary | ICD-10-CM | POA: Diagnosis not present

## 2022-03-16 DIAGNOSIS — L89223 Pressure ulcer of left hip, stage 3: Secondary | ICD-10-CM | POA: Diagnosis not present

## 2022-03-16 DIAGNOSIS — Z87891 Personal history of nicotine dependence: Secondary | ICD-10-CM | POA: Diagnosis not present

## 2022-03-16 DIAGNOSIS — K2100 Gastro-esophageal reflux disease with esophagitis, without bleeding: Secondary | ICD-10-CM | POA: Diagnosis not present

## 2022-03-16 DIAGNOSIS — E1142 Type 2 diabetes mellitus with diabetic polyneuropathy: Secondary | ICD-10-CM | POA: Diagnosis not present

## 2022-03-16 DIAGNOSIS — J449 Chronic obstructive pulmonary disease, unspecified: Secondary | ICD-10-CM | POA: Diagnosis not present

## 2022-03-16 DIAGNOSIS — Z7722 Contact with and (suspected) exposure to environmental tobacco smoke (acute) (chronic): Secondary | ICD-10-CM | POA: Diagnosis not present

## 2022-03-16 DIAGNOSIS — Z299 Encounter for prophylactic measures, unspecified: Secondary | ICD-10-CM | POA: Diagnosis not present

## 2022-03-16 DIAGNOSIS — E7800 Pure hypercholesterolemia, unspecified: Secondary | ICD-10-CM | POA: Diagnosis not present

## 2022-03-17 DIAGNOSIS — Z7984 Long term (current) use of oral hypoglycemic drugs: Secondary | ICD-10-CM | POA: Diagnosis not present

## 2022-03-17 DIAGNOSIS — Z993 Dependence on wheelchair: Secondary | ICD-10-CM | POA: Diagnosis not present

## 2022-03-17 DIAGNOSIS — K219 Gastro-esophageal reflux disease without esophagitis: Secondary | ICD-10-CM | POA: Diagnosis not present

## 2022-03-17 DIAGNOSIS — Z79891 Long term (current) use of opiate analgesic: Secondary | ICD-10-CM | POA: Diagnosis not present

## 2022-03-17 DIAGNOSIS — E876 Hypokalemia: Secondary | ICD-10-CM | POA: Diagnosis not present

## 2022-03-17 DIAGNOSIS — E1140 Type 2 diabetes mellitus with diabetic neuropathy, unspecified: Secondary | ICD-10-CM | POA: Diagnosis not present

## 2022-03-17 DIAGNOSIS — G8929 Other chronic pain: Secondary | ICD-10-CM | POA: Diagnosis not present

## 2022-03-17 DIAGNOSIS — E785 Hyperlipidemia, unspecified: Secondary | ICD-10-CM | POA: Diagnosis not present

## 2022-03-17 DIAGNOSIS — Z7982 Long term (current) use of aspirin: Secondary | ICD-10-CM | POA: Diagnosis not present

## 2022-03-17 DIAGNOSIS — Z791 Long term (current) use of non-steroidal anti-inflammatories (NSAID): Secondary | ICD-10-CM | POA: Diagnosis not present

## 2022-03-17 DIAGNOSIS — L89220 Pressure ulcer of left hip, unstageable: Secondary | ICD-10-CM | POA: Diagnosis not present

## 2022-03-17 DIAGNOSIS — M545 Low back pain, unspecified: Secondary | ICD-10-CM | POA: Diagnosis not present

## 2022-03-19 DIAGNOSIS — K219 Gastro-esophageal reflux disease without esophagitis: Secondary | ICD-10-CM | POA: Diagnosis not present

## 2022-03-19 DIAGNOSIS — Z993 Dependence on wheelchair: Secondary | ICD-10-CM | POA: Diagnosis not present

## 2022-03-19 DIAGNOSIS — Z791 Long term (current) use of non-steroidal anti-inflammatories (NSAID): Secondary | ICD-10-CM | POA: Diagnosis not present

## 2022-03-19 DIAGNOSIS — E876 Hypokalemia: Secondary | ICD-10-CM | POA: Diagnosis not present

## 2022-03-19 DIAGNOSIS — Z7984 Long term (current) use of oral hypoglycemic drugs: Secondary | ICD-10-CM | POA: Diagnosis not present

## 2022-03-19 DIAGNOSIS — G8929 Other chronic pain: Secondary | ICD-10-CM | POA: Diagnosis not present

## 2022-03-19 DIAGNOSIS — L89220 Pressure ulcer of left hip, unstageable: Secondary | ICD-10-CM | POA: Diagnosis not present

## 2022-03-19 DIAGNOSIS — E1140 Type 2 diabetes mellitus with diabetic neuropathy, unspecified: Secondary | ICD-10-CM | POA: Diagnosis not present

## 2022-03-19 DIAGNOSIS — Z79891 Long term (current) use of opiate analgesic: Secondary | ICD-10-CM | POA: Diagnosis not present

## 2022-03-19 DIAGNOSIS — E785 Hyperlipidemia, unspecified: Secondary | ICD-10-CM | POA: Diagnosis not present

## 2022-03-19 DIAGNOSIS — Z7982 Long term (current) use of aspirin: Secondary | ICD-10-CM | POA: Diagnosis not present

## 2022-03-23 DIAGNOSIS — Z79891 Long term (current) use of opiate analgesic: Secondary | ICD-10-CM | POA: Diagnosis not present

## 2022-03-23 DIAGNOSIS — E876 Hypokalemia: Secondary | ICD-10-CM | POA: Diagnosis not present

## 2022-03-23 DIAGNOSIS — Z7984 Long term (current) use of oral hypoglycemic drugs: Secondary | ICD-10-CM | POA: Diagnosis not present

## 2022-03-23 DIAGNOSIS — G8929 Other chronic pain: Secondary | ICD-10-CM | POA: Diagnosis not present

## 2022-03-23 DIAGNOSIS — Z791 Long term (current) use of non-steroidal anti-inflammatories (NSAID): Secondary | ICD-10-CM | POA: Diagnosis not present

## 2022-03-23 DIAGNOSIS — E1140 Type 2 diabetes mellitus with diabetic neuropathy, unspecified: Secondary | ICD-10-CM | POA: Diagnosis not present

## 2022-03-23 DIAGNOSIS — Z993 Dependence on wheelchair: Secondary | ICD-10-CM | POA: Diagnosis not present

## 2022-03-23 DIAGNOSIS — K219 Gastro-esophageal reflux disease without esophagitis: Secondary | ICD-10-CM | POA: Diagnosis not present

## 2022-03-23 DIAGNOSIS — Z7982 Long term (current) use of aspirin: Secondary | ICD-10-CM | POA: Diagnosis not present

## 2022-03-23 DIAGNOSIS — E785 Hyperlipidemia, unspecified: Secondary | ICD-10-CM | POA: Diagnosis not present

## 2022-03-23 DIAGNOSIS — L89220 Pressure ulcer of left hip, unstageable: Secondary | ICD-10-CM | POA: Diagnosis not present

## 2022-03-24 DIAGNOSIS — Z791 Long term (current) use of non-steroidal anti-inflammatories (NSAID): Secondary | ICD-10-CM | POA: Diagnosis not present

## 2022-03-24 DIAGNOSIS — Z993 Dependence on wheelchair: Secondary | ICD-10-CM | POA: Diagnosis not present

## 2022-03-24 DIAGNOSIS — E785 Hyperlipidemia, unspecified: Secondary | ICD-10-CM | POA: Diagnosis not present

## 2022-03-24 DIAGNOSIS — E876 Hypokalemia: Secondary | ICD-10-CM | POA: Diagnosis not present

## 2022-03-24 DIAGNOSIS — Z7982 Long term (current) use of aspirin: Secondary | ICD-10-CM | POA: Diagnosis not present

## 2022-03-24 DIAGNOSIS — Z79891 Long term (current) use of opiate analgesic: Secondary | ICD-10-CM | POA: Diagnosis not present

## 2022-03-24 DIAGNOSIS — L89220 Pressure ulcer of left hip, unstageable: Secondary | ICD-10-CM | POA: Diagnosis not present

## 2022-03-24 DIAGNOSIS — K219 Gastro-esophageal reflux disease without esophagitis: Secondary | ICD-10-CM | POA: Diagnosis not present

## 2022-03-24 DIAGNOSIS — Z7984 Long term (current) use of oral hypoglycemic drugs: Secondary | ICD-10-CM | POA: Diagnosis not present

## 2022-03-24 DIAGNOSIS — E1140 Type 2 diabetes mellitus with diabetic neuropathy, unspecified: Secondary | ICD-10-CM | POA: Diagnosis not present

## 2022-03-24 DIAGNOSIS — G8929 Other chronic pain: Secondary | ICD-10-CM | POA: Diagnosis not present

## 2022-03-25 DIAGNOSIS — M48062 Spinal stenosis, lumbar region with neurogenic claudication: Secondary | ICD-10-CM | POA: Diagnosis not present

## 2022-03-25 DIAGNOSIS — M4804 Spinal stenosis, thoracic region: Secondary | ICD-10-CM | POA: Diagnosis not present

## 2022-03-25 DIAGNOSIS — R29898 Other symptoms and signs involving the musculoskeletal system: Secondary | ICD-10-CM | POA: Diagnosis not present

## 2022-03-25 DIAGNOSIS — G9520 Unspecified cord compression: Secondary | ICD-10-CM | POA: Diagnosis not present

## 2022-03-30 DIAGNOSIS — L89223 Pressure ulcer of left hip, stage 3: Secondary | ICD-10-CM | POA: Diagnosis not present

## 2022-03-30 DIAGNOSIS — J449 Chronic obstructive pulmonary disease, unspecified: Secondary | ICD-10-CM | POA: Diagnosis not present

## 2022-03-30 DIAGNOSIS — Z87891 Personal history of nicotine dependence: Secondary | ICD-10-CM | POA: Diagnosis not present

## 2022-03-30 DIAGNOSIS — L02416 Cutaneous abscess of left lower limb: Secondary | ICD-10-CM | POA: Diagnosis not present

## 2022-03-30 DIAGNOSIS — K2100 Gastro-esophageal reflux disease with esophagitis, without bleeding: Secondary | ICD-10-CM | POA: Diagnosis not present

## 2022-03-30 DIAGNOSIS — I10 Essential (primary) hypertension: Secondary | ICD-10-CM | POA: Diagnosis not present

## 2022-03-30 DIAGNOSIS — Z885 Allergy status to narcotic agent status: Secondary | ICD-10-CM | POA: Diagnosis not present

## 2022-03-30 DIAGNOSIS — E7800 Pure hypercholesterolemia, unspecified: Secondary | ICD-10-CM | POA: Diagnosis not present

## 2022-03-30 DIAGNOSIS — Z7722 Contact with and (suspected) exposure to environmental tobacco smoke (acute) (chronic): Secondary | ICD-10-CM | POA: Diagnosis not present

## 2022-03-30 DIAGNOSIS — E1142 Type 2 diabetes mellitus with diabetic polyneuropathy: Secondary | ICD-10-CM | POA: Diagnosis not present

## 2022-03-31 DIAGNOSIS — E1140 Type 2 diabetes mellitus with diabetic neuropathy, unspecified: Secondary | ICD-10-CM | POA: Diagnosis not present

## 2022-03-31 DIAGNOSIS — Z7984 Long term (current) use of oral hypoglycemic drugs: Secondary | ICD-10-CM | POA: Diagnosis not present

## 2022-03-31 DIAGNOSIS — Z993 Dependence on wheelchair: Secondary | ICD-10-CM | POA: Diagnosis not present

## 2022-03-31 DIAGNOSIS — K219 Gastro-esophageal reflux disease without esophagitis: Secondary | ICD-10-CM | POA: Diagnosis not present

## 2022-03-31 DIAGNOSIS — Z79891 Long term (current) use of opiate analgesic: Secondary | ICD-10-CM | POA: Diagnosis not present

## 2022-03-31 DIAGNOSIS — Z791 Long term (current) use of non-steroidal anti-inflammatories (NSAID): Secondary | ICD-10-CM | POA: Diagnosis not present

## 2022-03-31 DIAGNOSIS — E876 Hypokalemia: Secondary | ICD-10-CM | POA: Diagnosis not present

## 2022-03-31 DIAGNOSIS — Z7982 Long term (current) use of aspirin: Secondary | ICD-10-CM | POA: Diagnosis not present

## 2022-03-31 DIAGNOSIS — E785 Hyperlipidemia, unspecified: Secondary | ICD-10-CM | POA: Diagnosis not present

## 2022-03-31 DIAGNOSIS — G8929 Other chronic pain: Secondary | ICD-10-CM | POA: Diagnosis not present

## 2022-03-31 DIAGNOSIS — L89220 Pressure ulcer of left hip, unstageable: Secondary | ICD-10-CM | POA: Diagnosis not present

## 2022-04-01 DIAGNOSIS — Z7984 Long term (current) use of oral hypoglycemic drugs: Secondary | ICD-10-CM | POA: Diagnosis not present

## 2022-04-01 DIAGNOSIS — E785 Hyperlipidemia, unspecified: Secondary | ICD-10-CM | POA: Diagnosis not present

## 2022-04-01 DIAGNOSIS — E119 Type 2 diabetes mellitus without complications: Secondary | ICD-10-CM | POA: Diagnosis not present

## 2022-04-01 DIAGNOSIS — Z7982 Long term (current) use of aspirin: Secondary | ICD-10-CM | POA: Diagnosis not present

## 2022-04-01 DIAGNOSIS — Z791 Long term (current) use of non-steroidal anti-inflammatories (NSAID): Secondary | ICD-10-CM | POA: Diagnosis not present

## 2022-04-01 DIAGNOSIS — Z993 Dependence on wheelchair: Secondary | ICD-10-CM | POA: Diagnosis not present

## 2022-04-01 DIAGNOSIS — E1140 Type 2 diabetes mellitus with diabetic neuropathy, unspecified: Secondary | ICD-10-CM | POA: Diagnosis not present

## 2022-04-01 DIAGNOSIS — E876 Hypokalemia: Secondary | ICD-10-CM | POA: Diagnosis not present

## 2022-04-01 DIAGNOSIS — K219 Gastro-esophageal reflux disease without esophagitis: Secondary | ICD-10-CM | POA: Diagnosis not present

## 2022-04-01 DIAGNOSIS — Z4803 Encounter for change or removal of drains: Secondary | ICD-10-CM | POA: Diagnosis not present

## 2022-04-01 DIAGNOSIS — Z79891 Long term (current) use of opiate analgesic: Secondary | ICD-10-CM | POA: Diagnosis not present

## 2022-04-01 DIAGNOSIS — L89220 Pressure ulcer of left hip, unstageable: Secondary | ICD-10-CM | POA: Diagnosis not present

## 2022-04-01 DIAGNOSIS — G8929 Other chronic pain: Secondary | ICD-10-CM | POA: Diagnosis not present

## 2022-04-01 DIAGNOSIS — N390 Urinary tract infection, site not specified: Secondary | ICD-10-CM | POA: Diagnosis not present

## 2022-04-03 DIAGNOSIS — Z79891 Long term (current) use of opiate analgesic: Secondary | ICD-10-CM | POA: Diagnosis not present

## 2022-04-03 DIAGNOSIS — E785 Hyperlipidemia, unspecified: Secondary | ICD-10-CM | POA: Diagnosis not present

## 2022-04-03 DIAGNOSIS — Z7982 Long term (current) use of aspirin: Secondary | ICD-10-CM | POA: Diagnosis not present

## 2022-04-03 DIAGNOSIS — K219 Gastro-esophageal reflux disease without esophagitis: Secondary | ICD-10-CM | POA: Diagnosis not present

## 2022-04-03 DIAGNOSIS — G8929 Other chronic pain: Secondary | ICD-10-CM | POA: Diagnosis not present

## 2022-04-03 DIAGNOSIS — E1140 Type 2 diabetes mellitus with diabetic neuropathy, unspecified: Secondary | ICD-10-CM | POA: Diagnosis not present

## 2022-04-03 DIAGNOSIS — L89220 Pressure ulcer of left hip, unstageable: Secondary | ICD-10-CM | POA: Diagnosis not present

## 2022-04-03 DIAGNOSIS — Z993 Dependence on wheelchair: Secondary | ICD-10-CM | POA: Diagnosis not present

## 2022-04-03 DIAGNOSIS — Z4803 Encounter for change or removal of drains: Secondary | ICD-10-CM | POA: Diagnosis not present

## 2022-04-03 DIAGNOSIS — Z7984 Long term (current) use of oral hypoglycemic drugs: Secondary | ICD-10-CM | POA: Diagnosis not present

## 2022-04-03 DIAGNOSIS — Z791 Long term (current) use of non-steroidal anti-inflammatories (NSAID): Secondary | ICD-10-CM | POA: Diagnosis not present

## 2022-04-03 DIAGNOSIS — E876 Hypokalemia: Secondary | ICD-10-CM | POA: Diagnosis not present

## 2022-04-06 DIAGNOSIS — E7800 Pure hypercholesterolemia, unspecified: Secondary | ICD-10-CM | POA: Diagnosis not present

## 2022-04-06 DIAGNOSIS — E119 Type 2 diabetes mellitus without complications: Secondary | ICD-10-CM | POA: Diagnosis not present

## 2022-04-06 DIAGNOSIS — Z87891 Personal history of nicotine dependence: Secondary | ICD-10-CM | POA: Diagnosis not present

## 2022-04-06 DIAGNOSIS — J449 Chronic obstructive pulmonary disease, unspecified: Secondary | ICD-10-CM | POA: Diagnosis not present

## 2022-04-06 DIAGNOSIS — I10 Essential (primary) hypertension: Secondary | ICD-10-CM | POA: Diagnosis not present

## 2022-04-06 DIAGNOSIS — L89223 Pressure ulcer of left hip, stage 3: Secondary | ICD-10-CM | POA: Diagnosis not present

## 2022-04-06 DIAGNOSIS — Z7722 Contact with and (suspected) exposure to environmental tobacco smoke (acute) (chronic): Secondary | ICD-10-CM | POA: Diagnosis not present

## 2022-04-06 DIAGNOSIS — Z885 Allergy status to narcotic agent status: Secondary | ICD-10-CM | POA: Diagnosis not present

## 2022-04-06 DIAGNOSIS — E1142 Type 2 diabetes mellitus with diabetic polyneuropathy: Secondary | ICD-10-CM | POA: Diagnosis not present

## 2022-04-06 DIAGNOSIS — E782 Mixed hyperlipidemia: Secondary | ICD-10-CM | POA: Diagnosis not present

## 2022-04-06 DIAGNOSIS — K2100 Gastro-esophageal reflux disease with esophagitis, without bleeding: Secondary | ICD-10-CM | POA: Diagnosis not present

## 2022-04-07 DIAGNOSIS — G8929 Other chronic pain: Secondary | ICD-10-CM | POA: Diagnosis not present

## 2022-04-07 DIAGNOSIS — Z7982 Long term (current) use of aspirin: Secondary | ICD-10-CM | POA: Diagnosis not present

## 2022-04-07 DIAGNOSIS — Z7984 Long term (current) use of oral hypoglycemic drugs: Secondary | ICD-10-CM | POA: Diagnosis not present

## 2022-04-07 DIAGNOSIS — Z791 Long term (current) use of non-steroidal anti-inflammatories (NSAID): Secondary | ICD-10-CM | POA: Diagnosis not present

## 2022-04-07 DIAGNOSIS — Z79891 Long term (current) use of opiate analgesic: Secondary | ICD-10-CM | POA: Diagnosis not present

## 2022-04-07 DIAGNOSIS — L89220 Pressure ulcer of left hip, unstageable: Secondary | ICD-10-CM | POA: Diagnosis not present

## 2022-04-07 DIAGNOSIS — E785 Hyperlipidemia, unspecified: Secondary | ICD-10-CM | POA: Diagnosis not present

## 2022-04-07 DIAGNOSIS — Z993 Dependence on wheelchair: Secondary | ICD-10-CM | POA: Diagnosis not present

## 2022-04-07 DIAGNOSIS — E1140 Type 2 diabetes mellitus with diabetic neuropathy, unspecified: Secondary | ICD-10-CM | POA: Diagnosis not present

## 2022-04-07 DIAGNOSIS — Z4803 Encounter for change or removal of drains: Secondary | ICD-10-CM | POA: Diagnosis not present

## 2022-04-07 DIAGNOSIS — E876 Hypokalemia: Secondary | ICD-10-CM | POA: Diagnosis not present

## 2022-04-07 DIAGNOSIS — K219 Gastro-esophageal reflux disease without esophagitis: Secondary | ICD-10-CM | POA: Diagnosis not present

## 2022-04-07 LAB — COMPREHENSIVE METABOLIC PANEL
ALT: 13 IU/L (ref 0–44)
AST: 12 IU/L (ref 0–40)
Albumin/Globulin Ratio: 1.4 (ref 1.2–2.2)
Albumin: 3.9 g/dL (ref 3.8–4.8)
Alkaline Phosphatase: 80 IU/L (ref 44–121)
BUN/Creatinine Ratio: 24 (ref 10–24)
BUN: 25 mg/dL (ref 8–27)
Bilirubin Total: 0.3 mg/dL (ref 0.0–1.2)
CO2: 25 mmol/L (ref 20–29)
Calcium: 9.6 mg/dL (ref 8.6–10.2)
Chloride: 99 mmol/L (ref 96–106)
Creatinine, Ser: 1.03 mg/dL (ref 0.76–1.27)
Globulin, Total: 2.7 g/dL (ref 1.5–4.5)
Glucose: 63 mg/dL — ABNORMAL LOW (ref 70–99)
Potassium: 3.7 mmol/L (ref 3.5–5.2)
Sodium: 140 mmol/L (ref 134–144)
Total Protein: 6.6 g/dL (ref 6.0–8.5)
eGFR: 78 mL/min/{1.73_m2} (ref >59)

## 2022-04-07 LAB — LIPID PANEL
Chol/HDL Ratio: 2.8 ratio (ref 0.0–5.0)
Cholesterol, Total: 130 mg/dL (ref 100–199)
HDL: 46 mg/dL (ref >39)
LDL Chol Calc (NIH): 62 mg/dL (ref 0–99)
Triglycerides: 120 mg/dL (ref 0–149)
VLDL Cholesterol Cal: 22 mg/dL (ref 5–40)

## 2022-04-08 DIAGNOSIS — L89220 Pressure ulcer of left hip, unstageable: Secondary | ICD-10-CM | POA: Diagnosis not present

## 2022-04-08 DIAGNOSIS — Z7982 Long term (current) use of aspirin: Secondary | ICD-10-CM | POA: Diagnosis not present

## 2022-04-08 DIAGNOSIS — Z7984 Long term (current) use of oral hypoglycemic drugs: Secondary | ICD-10-CM | POA: Diagnosis not present

## 2022-04-08 DIAGNOSIS — E876 Hypokalemia: Secondary | ICD-10-CM | POA: Diagnosis not present

## 2022-04-08 DIAGNOSIS — Z79891 Long term (current) use of opiate analgesic: Secondary | ICD-10-CM | POA: Diagnosis not present

## 2022-04-08 DIAGNOSIS — G8929 Other chronic pain: Secondary | ICD-10-CM | POA: Diagnosis not present

## 2022-04-08 DIAGNOSIS — K219 Gastro-esophageal reflux disease without esophagitis: Secondary | ICD-10-CM | POA: Diagnosis not present

## 2022-04-08 DIAGNOSIS — E785 Hyperlipidemia, unspecified: Secondary | ICD-10-CM | POA: Diagnosis not present

## 2022-04-08 DIAGNOSIS — Z4803 Encounter for change or removal of drains: Secondary | ICD-10-CM | POA: Diagnosis not present

## 2022-04-08 DIAGNOSIS — E1140 Type 2 diabetes mellitus with diabetic neuropathy, unspecified: Secondary | ICD-10-CM | POA: Diagnosis not present

## 2022-04-08 DIAGNOSIS — Z791 Long term (current) use of non-steroidal anti-inflammatories (NSAID): Secondary | ICD-10-CM | POA: Diagnosis not present

## 2022-04-08 DIAGNOSIS — Z993 Dependence on wheelchair: Secondary | ICD-10-CM | POA: Diagnosis not present

## 2022-04-10 DIAGNOSIS — E876 Hypokalemia: Secondary | ICD-10-CM | POA: Diagnosis not present

## 2022-04-10 DIAGNOSIS — G8929 Other chronic pain: Secondary | ICD-10-CM | POA: Diagnosis not present

## 2022-04-10 DIAGNOSIS — E1140 Type 2 diabetes mellitus with diabetic neuropathy, unspecified: Secondary | ICD-10-CM | POA: Diagnosis not present

## 2022-04-10 DIAGNOSIS — E785 Hyperlipidemia, unspecified: Secondary | ICD-10-CM | POA: Diagnosis not present

## 2022-04-10 DIAGNOSIS — Z7982 Long term (current) use of aspirin: Secondary | ICD-10-CM | POA: Diagnosis not present

## 2022-04-10 DIAGNOSIS — L89220 Pressure ulcer of left hip, unstageable: Secondary | ICD-10-CM | POA: Diagnosis not present

## 2022-04-10 DIAGNOSIS — Z993 Dependence on wheelchair: Secondary | ICD-10-CM | POA: Diagnosis not present

## 2022-04-10 DIAGNOSIS — Z791 Long term (current) use of non-steroidal anti-inflammatories (NSAID): Secondary | ICD-10-CM | POA: Diagnosis not present

## 2022-04-10 DIAGNOSIS — Z4803 Encounter for change or removal of drains: Secondary | ICD-10-CM | POA: Diagnosis not present

## 2022-04-10 DIAGNOSIS — K219 Gastro-esophageal reflux disease without esophagitis: Secondary | ICD-10-CM | POA: Diagnosis not present

## 2022-04-10 DIAGNOSIS — Z79891 Long term (current) use of opiate analgesic: Secondary | ICD-10-CM | POA: Diagnosis not present

## 2022-04-10 DIAGNOSIS — Z7984 Long term (current) use of oral hypoglycemic drugs: Secondary | ICD-10-CM | POA: Diagnosis not present

## 2022-04-11 ENCOUNTER — Ambulatory Visit: Payer: Medicare Other | Admitting: "Endocrinology

## 2022-04-11 ENCOUNTER — Encounter: Payer: Self-pay | Admitting: "Endocrinology

## 2022-04-11 VITALS — BP 110/64 | HR 80

## 2022-04-11 DIAGNOSIS — E119 Type 2 diabetes mellitus without complications: Secondary | ICD-10-CM | POA: Diagnosis not present

## 2022-04-11 DIAGNOSIS — E782 Mixed hyperlipidemia: Secondary | ICD-10-CM

## 2022-04-11 DIAGNOSIS — I10 Essential (primary) hypertension: Secondary | ICD-10-CM | POA: Diagnosis not present

## 2022-04-11 LAB — POCT GLYCOSYLATED HEMOGLOBIN (HGB A1C): HbA1c, POC (controlled diabetic range): 6 % (ref 0.0–7.0)

## 2022-04-11 NOTE — Progress Notes (Signed)
04/11/2022, 6:20 PM  Endocrinology follow-up note  Subjective:    Patient ID: Patrick Aguilar, male    DOB: 04-20-1951.  Patrick Aguilar is being seen in follow-up after he was seen in consultation for management of currently controlled asymptomatic diabetes requested by  Monico Blitz, MD.   Past Medical History:  Diagnosis Date   AKI (acute kidney injury) ([place]) 12/2017   Angio-edema    Anxiety    Arthritis    Complication of anesthesia    low respirations, low BP   COPD (chronic obstructive pulmonary disease) ([place])    DDD (degenerative disc disease), lumbar    Diabetes mellitus without complication ([place])    type 2   Diastolic dysfunction 67/08/4579   Moderate noted on ECHO   Fournier's gangrene in male    GERD (gastroesophageal reflux disease)    History of ARDS    History of necrotizing fasciitis    Severe   Hypertension    Lumbar spondylosis    Numbness and tingling of both upper extremities    Pulmonary hypertension ([place]) 12/14/2017   Mild, noted on ECHO   Recurrent upper respiratory infection (URI)    Respiratory failure, acute ([place]) 12/2017   Septic shock ([place]) 12/2017   Shortness of breath dyspnea    Testicular pain, left    Wears glasses     Past Surgical History:  Procedure Laterality Date   ADENOIDECTOMY     APPENDECTOMY     APPLICATION OF A-CELL OF CHEST/ABDOMEN N/A 01/08/2018   Procedure: APPLICATION OF A-CELL OF GROIN;  Surgeon: Wallace Going, DO;  Location: [facility];  Service: Plastics;  Laterality: N/A;   APPLICATION OF A-CELL OF EXTREMITY N/A 12/25/2017   Procedure: APPLICATION OF A-CELL;  Surgeon: Wallace Going, DO;  Location: [facility];  Service: Plastics;  Laterality: N/A;   BACK SURGERY     x5   CARDIAC CATHETERIZATION  2006   neg   CERVICAL DISC SURGERY     x2   COLONOSCOPY     DEBRIDEMENT AND CLOSURE WOUND N/A 01/26/2018   Procedure: REVISION OF PERINEUM WOUND WITH DEBRIDEMENT, PARTIAL CLOSURE OF  PERINEUM, PLACEMENT OF [place];  Surgeon: Wallace Going, DO;  Location: [facility];  Service: Plastics;  Laterality: N/A;   DENTAL SURGERY     teeth extractions   groin wound  01/08/2018   : EXCISION OF GROIN WOUND WITH PLACEMENT OF ACELL, AND PRIMARY WOUND CLOSURE (N/A Scrotum)   I & D EXTREMITY N/A 03/12/2018   Procedure: IRRIGATION AND DEBRIDEMENT PERIMUM WOUND WITH CLOSURE;  Surgeon: Wallace Going, DO;  Location: [facility];  Service: Plastics;  Laterality: N/A;   INCISION AND DRAINAGE ABSCESS Left 07/11/2018   Procedure: INCISION AND DRAINAGE ABSCESS LEFT THIGH;  Surgeon: Franchot Gallo, MD;  Location: [facility];  Service: Urology;  Laterality: Left;   INCISION AND DRAINAGE OF WOUND N/A 12/25/2017   Procedure: Irrigation and debridement of Fournier's of scrotum with placement of testes in subcutaneous thigh pockets and Acell placement;  Surgeon: Wallace Going, DO;  Location: [facility];  Service: Plastics;  Laterality: N/A;   INCISION AND DRAINAGE OF WOUND N/A 01/08/2018   Procedure: EXCISION OF GROIN WOUND WITH PLACEMENT OF ACELL, AND PRIMARY WOUND CLOSURE;  Surgeon: Wallace Going, DO;  Location: [facility];  Service: Plastics;  Laterality: N/A;   ORCHIECTOMY N/A 12/13/2017   Procedure: EXCISION OF SCROTUM AND DEBRIDEMENT OF PENIS;  Surgeon: Diona Fanti,  Annie Main, MD;  Location: [facility];  Service: Urology;  Laterality: N/A;   ORCHIECTOMY Left 06/22/2018   Procedure: ORCHIECTOMY;  Surgeon: Franchot Gallo, MD;  [facility];  Service: Urology;  Laterality: Left;   PLANTAR FASCIA SURGERY Bilateral    shoulders Bilateral    rotator cuff   SUBMANDIBULAR GLAND EXCISION Left 03/12/2015   Procedure: LEFT SUBMANDIBULAR GLAND RESECTION;  Surgeon: Melida Quitter, MD;  Location: [facility];  Service: ENT;  Laterality: Left;   TONSILLECTOMY     TOTAL HIP ARTHROPLASTY Right 03/06/2019   Procedure: TOTAL HIP ARTHROPLASTY ANTERIOR APPROACH;  Surgeon: Gaynelle Arabian, MD;  Location: [facility];  Service: Orthopedics;  Laterality: Right;  170mn    Social History   Socioeconomic History   Marital status: Single    Spouse name: Not on file   Number of children: Not on file   Years of education: Not on file   Highest education level: Not on file  Occupational History   Not on file  Tobacco Use   Smoking status: Former    Packs/day: 1.00    Years: 50.00    Total pack years: 50.00    Types: Cigarettes    Quit date: 12/13/2016    Years since quitting: 5.3   Smokeless tobacco: Never  Vaping Use   Vaping Use: Never used  Substance and Sexual Activity   Alcohol use: No    Comment: quit alcohol 80's   Drug use: No   Sexual activity: Not on file  Other Topics Concern   Not on file  Social History Narrative   Not on file   Social Determinants of Health   Financial Resource Strain: Not on file  Food Insecurity: Not on file  Transportation Needs: Not on file  Physical Activity: Not on file  Stress: Not on file  Social Connections: Not on file    Family History  Problem Relation Age of Onset   Lung cancer Mother    Bladder Cancer Father    Allergic rhinitis Neg Hx    Asthma Neg Hx    Eczema Neg Hx    Urticaria Neg Hx     Outpatient Encounter Medications as of 04/11/2022  Medication Sig   albuterol (PROVENTIL) (2.5 MG/3ML) 0.083% nebulizer solution Inhale 2.5 mg into the lungs every 6 (six) hours as needed for shortness of breath or wheezing.   albuterol (VENTOLIN HFA) 108 (90 Base) MCG/ACT inhaler Inhale 2 puffs into the lungs every 6 (six) hours as needed for wheezing or shortness of breath.   aspirin 81 MG EC tablet Take by mouth.   atenolol-chlorthalidone (TENORETIC) 50-25 MG tablet Take 1 tablet by mouth daily with breakfast.    blood glucose meter kit and supplies KIT Dispense based on patient and insurance preference. Use up to four times daily as directed. (FOR ICD-9 250.00, 250.01).   celecoxib (CELEBREX) 200 MG capsule Take 1  capsule by mouth daily.   doxazosin (CARDURA XL) 8 MG 24 hr tablet Take by mouth.   famotidine (PEPCID) 20 MG tablet Take 20 mg by mouth 2 (two) times daily.   furosemide (LASIX) 20 MG tablet Take 20 mg by mouth daily after breakfast.    gabapentin (NEURONTIN) 800 MG tablet Take 800 mg by mouth every 6 (six) hours.    glipiZIDE (GLUCOTROL) 5 MG tablet Take 1 tablet (5 mg total) by mouth 2 (two) times daily.   metFORMIN (GLUCOPHAGE) 500 MG tablet Take 500 mg by mouth once.  oxyCODONE-acetaminophen (PERCOCET) 10-325 MG tablet Take 1 tablet by mouth every 6 (six) hours.   OXYGEN Inhale 1-3 L into the lungs.   pantoprazole (PROTONIX) 40 MG tablet Take 40 mg by mouth daily.   potassium chloride (KLOR-CON) 20 MEQ packet Take by mouth.   rosuvastatin (CRESTOR) 5 MG tablet Take 5 mg by mouth every Monday.   traZODone (DESYREL) 100 MG tablet Take 100 mg by mouth at bedtime.   [DISCONTINUED] diclofenac (VOLTAREN) 75 MG EC tablet Take 75 mg by mouth 2 (two) times daily.   No facility-administered encounter medications on file as of 04/11/2022.    ALLERGIES: Allergies  Allergen Reactions   Tramadol Other (See Comments)    tremor   Adenosine     can't tolerate  05/2005   Bupropion     Nausea   Morphine And Related Itching    VACCINATION STATUS: Immunization History  Administered Date(s) Administered   Td,absorbed, Preservative Free, Adult Use, Lf Unspecified 03/05/2007   Tdap 08/10/2017   Zoster Recombinat (Shingrix) 08/10/2017    Diabetes He presents for his follow-up diabetic visit. He has type 2 diabetes mellitus. Onset time: She was diagnosed at approximate age of 18 years. His disease course has been stable. There are no hypoglycemic associated symptoms. Pertinent negatives for hypoglycemia include no confusion, headaches, pallor or seizures. There are no diabetic associated symptoms. Pertinent negatives for diabetes include no chest pain, no fatigue, no polydipsia, no polyphagia, no  polyuria and no weakness. There are no hypoglycemic complications. Symptoms are stable. Diabetic complications include nephropathy. Risk factors for coronary artery disease include diabetes mellitus, dyslipidemia, hypertension, male sex and sedentary lifestyle. Current diabetic treatments: Is currently on Metformin 500 mg p.o. once  daily, glipizide 5 mg po BID. His weight is increasing steadily. He is following a generally unhealthy diet. When asked about meal planning, he reported none. He has not had a previous visit with a dietitian. He participates in exercise intermittently. His home blood glucose trend is decreasing steadily. His breakfast blood glucose range is generally 130-140 mg/dl. His overall blood glucose range is 140-180 mg/dl. (His presents with stable glycemic profile, average blood glucose of 156 for the last 30 days.  His point-of-care A1c is 6%.  He denies an did not document any hypoglycemia.   More recently due to worsening right knee giving out, he is wheelchair bound.   ) An ACE inhibitor/angiotensin II receptor blocker is being taken.  Hyperlipidemia This is a chronic problem. The current episode started more than 1 year ago. Exacerbating diseases include diabetes. Pertinent negatives include no chest pain, myalgias or shortness of breath. Current antihyperlipidemic treatment includes statins. Risk factors for coronary artery disease include diabetes mellitus, dyslipidemia, male sex, hypertension and a sedentary lifestyle.  Hypertension This is a chronic problem. The current episode started more than 1 year ago. The problem is controlled. Pertinent negatives include no chest pain, headaches, neck pain, palpitations or shortness of breath. Risk factors for coronary artery disease include diabetes mellitus, dyslipidemia, sedentary lifestyle and smoking/tobacco exposure. Past treatments include beta blockers and diuretics. Hypertensive end-organ damage includes heart failure.      Review of Systems  Constitutional:  Negative for chills, fatigue, fever and unexpected weight change.  HENT:  Negative for dental problem, mouth sores and trouble swallowing.   Eyes:  Negative for visual disturbance.  Respiratory:  Negative for cough, choking, chest tightness, shortness of breath and wheezing.   Cardiovascular:  Negative for chest pain, palpitations and leg swelling.  Gastrointestinal:  Negative for abdominal distention, abdominal pain, constipation, diarrhea, nausea and vomiting.  Endocrine: Negative for polydipsia, polyphagia and polyuria.  Genitourinary:  Negative for dysuria, flank pain, hematuria and urgency.  Musculoskeletal:  Positive for gait problem. Negative for back pain, myalgias and neck pain.       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   Skin:  Negative for pallor, rash and wound.  Neurological:  Negative for seizures, syncope, weakness, numbness and headaches.  Psychiatric/Behavioral:  Negative for confusion and dysphoric mood.     Objective:       04/11/2022    2:36 PM 10/12/2021    3:11 PM 08/16/2021    2:02 PM  Vitals with BMI  Height  _0  _1   Weight  183 lbs 183 lbs 3 oz  BMI  65.03 54.65  Systolic 681 275 170  Diastolic 64 64 64  Pulse 80 76 80    BP 110/64   Pulse 80   Wt Readings from Last 3 Encounters:  10/12/21 183 lb (83 kg)  08/16/21 183 lb 3.2 oz (83.1 kg)  07/13/21 176 lb (79.8 kg)       CMP ( most recent) CMP     Component Value Date/Time   NA 140 04/06/2022 1129   K 3.7 04/06/2022 1129   CL 99 04/06/2022 1129   CO2 25 04/06/2022 1129   GLUCOSE 63 (L) 04/06/2022 1129   GLUCOSE 212 (H) 02/12/2021 1109   BUN 25 04/06/2022 1129   CREATININE 1.03 04/06/2022 1129   CREATININE 1.13 05/13/2020 0000   CALCIUM 9.6 04/06/2022 1129   PROT 6.6 04/06/2022 1129   ALBUMIN 3.9 04/06/2022 1129   AST 12 04/06/2022 1129   ALT 13 04/06/2022 1129   ALKPHOS 80 04/06/2022 1129   BILITOT 0.3 04/06/2022  1129   GFRNONAA >60 02/12/2021 1109   GFRNONAA 66 05/13/2020 0000   GFRAA 76 05/13/2020 0000     Diabetic Labs (most recent): Lab Results  Component Value Date   HGBA1C 6.0 04/11/2022   HGBA1C 5.9 08/04/2021   HGBA1C 6.6 05/03/2021   MICROALBUR <0.2 05/13/2020     Lipid Panel ( most recent) Lipid Panel     Component Value Date/Time   CHOL 130 04/06/2022 1129   TRIG 120 04/06/2022 1129   HDL 46 04/06/2022 1129   CHOLHDL 2.8 04/06/2022 1129   LDLCALC 62 04/06/2022 1129   LABVLDL 22 04/06/2022 1129     Assessment & Plan:   1. Type 2 diabetes mellitus without complication, without long-term current use of insulin (HCC)  - Patrick Aguilar has currently controlled asymptomatic type 2 DM since  71 years of age. His presents with stable glycemic profile, average blood glucose of 156 for the last 30 days.  His point-of-care A1c is 6%.  He denies an did not document any hypoglycemia.   More recently due to worsening right knee giving out, he is wheelchair bound.  - I had a long discussion with him about the progressive nature of diabetes and the pathology behind its complications. -his diabetes is complicated by  hx of  heavy smoking, deconditioning/sedentary life, diastolic dysfunction and he remains at a high risk for more acute and chronic complications which include CAD, CVA, CKD, retinopathy, and neuropathy. These are all discussed in detail with him.  - I have counseled him on diet  and weight management  by adopting a carbohydrate restricted/protein rich diet. Patient is encouraged to switch to  unprocessed or minimally processed     complex starch and increased protein intake (animal or plant source), fruits, and vegetables. -  he is advised to stick to a routine mealtimes to eat 3 meals  a day and avoid unnecessary snacks ( to snack only to correct hypoglycemia).   - he acknowledges that there is a room for improvement in his food and drink choices. - Suggestion is made for  him to avoid simple carbohydrates  from his diet including Cakes, Sweet Desserts, Ice Cream, Soda (diet and regular), Sweet Tea, Candies, Chips, Cookies, Store Bought Juices, Alcohol in Excess of  1-2 drinks a day, Artificial Sweeteners,  Coffee Creamer, and "Sugar-free" Products, Lemonade. This will help patient to have more stable blood glucose profile and potentially avoid unintended weight gain.  - he will be scheduled with Jearld Fenton, RDN, CDE for diabetes education.  - I have approached him with the following individualized plan to manage  his diabetes and patient agrees:   -In light of his presentation with target glycemic profile and A1c, he will not need insulin treatment for now. He is advised to continue metformin 500 mg p.o. daily at breakfast, glipizide 5 mg p.o. twice daily with breakfast and supper.  He has a chance to come off of glipizide if he maintains near target glycemic profile next visit.     He is not a candidate for SGLT2 inhibitors due to his prior history of Fournier's gangrene.  - Specific targets for  A1c;  LDL, HDL,  and Triglycerides were discussed with the patient.  2) Blood Pressure /Hypertension:  His blood pressure is controlled to target.  He is advised to discontinue amlodipine.  He is advised to continue  atenolol/HCTZ mg p.o. daily with breakfast .  3) Lipids/Hyperlipidemia:   Review of his recent lipid panel showed improving hypertriglyceridemia at 177 improving from 259.  His LDL is improving to 62.  He is advised to continue Crestor 5 mg p.o. nightly.     Side effects and precautions discussed with him.  4)  Weight/Diet:  There is no height or weight on file to calculate BMI.  -He would benefit from some weight loss.   I discussed with him the fact that loss of 5 - 10% of his  current body weight will have the most impact on his diabetes management.  Exercise, and detailed carbohydrates information provided  -  detailed on discharge  instructions.  5) Chronic Care/Health Maintenance:  -he  is on Statin medications and  is encouraged to initiate and continue to follow up with Ophthalmology, Dentist,  Podiatrist at least yearly or according to recommendations, and advised to   stay away from smoking. I have recommended yearly flu vaccine and pneumonia vaccine at least every 5 years; moderate intensity exercise for up to 150 minutes weekly; and  sleep for at least 7 hours a day.  - he is  advised to maintain close follow up with Monico Blitz, MD for primary care needs, as well as his other providers for optimal and coordinated care.   I spent 41 minutes in the care of the patient today including review of labs from [place], Lipids, Thyroid Function, Hematology (current and previous including abstractions from other facilities); face-to-face time discussing  his blood glucose readings/logs, discussing hypoglycemia and hyperglycemia episodes and symptoms, medications doses, his options of short and long term treatment based on the latest standards of care / guidelines;  discussion about incorporating lifestyle medicine;  and documenting  the encounter. Risk reduction counseling performed per USPSTF guidelines to reduce obesity and cardiovascular risk factors.     Please refer to Patient Instructions for Blood Glucose Monitoring and Insulin/Medications Dosing Guide"  in media tab for additional information. Please  also refer to " Patient Self Inventory" in the Media  tab for reviewed elements of pertinent patient history.  Cherylynn Ridges participated in the discussions, expressed understanding, and voiced agreement with the above plans.  All questions were answered to his satisfaction. he is encouraged to contact clinic should he have any questions or concerns prior to his return visit.   Follow up plan: - Return in about 6 months (around 10/12/2022) for Bring Meter/CGM Device/Logs- A1c in Office.  Glade Lloyd, MD [facility]  [facility] [address] [place], [place] [address] Phone: [phone]  Fax: [phone]    04/11/2022, 6:20 PM  This note was partially dictated with voice recognition software. Similar sounding words can be transcribed inadequately or may not  be corrected upon review.

## 2022-04-11 NOTE — Patient Instructions (Signed)

## 2022-04-12 DIAGNOSIS — K219 Gastro-esophageal reflux disease without esophagitis: Secondary | ICD-10-CM | POA: Diagnosis not present

## 2022-04-12 DIAGNOSIS — Z4803 Encounter for change or removal of drains: Secondary | ICD-10-CM | POA: Diagnosis not present

## 2022-04-12 DIAGNOSIS — Z791 Long term (current) use of non-steroidal anti-inflammatories (NSAID): Secondary | ICD-10-CM | POA: Diagnosis not present

## 2022-04-12 DIAGNOSIS — S31819A Unspecified open wound of right buttock, initial encounter: Secondary | ICD-10-CM | POA: Diagnosis not present

## 2022-04-12 DIAGNOSIS — Z7982 Long term (current) use of aspirin: Secondary | ICD-10-CM | POA: Diagnosis not present

## 2022-04-12 DIAGNOSIS — E876 Hypokalemia: Secondary | ICD-10-CM | POA: Diagnosis not present

## 2022-04-12 DIAGNOSIS — E1140 Type 2 diabetes mellitus with diabetic neuropathy, unspecified: Secondary | ICD-10-CM | POA: Diagnosis not present

## 2022-04-12 DIAGNOSIS — Z7984 Long term (current) use of oral hypoglycemic drugs: Secondary | ICD-10-CM | POA: Diagnosis not present

## 2022-04-12 DIAGNOSIS — L89890 Pressure ulcer of other site, unstageable: Secondary | ICD-10-CM | POA: Diagnosis not present

## 2022-04-12 DIAGNOSIS — L89220 Pressure ulcer of left hip, unstageable: Secondary | ICD-10-CM | POA: Diagnosis not present

## 2022-04-12 DIAGNOSIS — E785 Hyperlipidemia, unspecified: Secondary | ICD-10-CM | POA: Diagnosis not present

## 2022-04-12 DIAGNOSIS — G8929 Other chronic pain: Secondary | ICD-10-CM | POA: Diagnosis not present

## 2022-04-12 DIAGNOSIS — T8131XA Disruption of external operation (surgical) wound, not elsewhere classified, initial encounter: Secondary | ICD-10-CM | POA: Diagnosis not present

## 2022-04-12 DIAGNOSIS — Z79891 Long term (current) use of opiate analgesic: Secondary | ICD-10-CM | POA: Diagnosis not present

## 2022-04-12 DIAGNOSIS — Z993 Dependence on wheelchair: Secondary | ICD-10-CM | POA: Diagnosis not present

## 2022-04-12 DIAGNOSIS — S71102A Unspecified open wound, left thigh, initial encounter: Secondary | ICD-10-CM | POA: Diagnosis not present

## 2022-04-13 DIAGNOSIS — Z86711 Personal history of pulmonary embolism: Secondary | ICD-10-CM | POA: Diagnosis not present

## 2022-04-13 DIAGNOSIS — L89309 Pressure ulcer of unspecified buttock, unspecified stage: Secondary | ICD-10-CM | POA: Diagnosis not present

## 2022-04-13 DIAGNOSIS — Z888 Allergy status to other drugs, medicaments and biological substances status: Secondary | ICD-10-CM | POA: Diagnosis not present

## 2022-04-13 DIAGNOSIS — M1991 Primary osteoarthritis, unspecified site: Secondary | ICD-10-CM | POA: Diagnosis not present

## 2022-04-13 DIAGNOSIS — Z885 Allergy status to narcotic agent status: Secondary | ICD-10-CM | POA: Diagnosis not present

## 2022-04-13 DIAGNOSIS — Z9049 Acquired absence of other specified parts of digestive tract: Secondary | ICD-10-CM | POA: Diagnosis not present

## 2022-04-13 DIAGNOSIS — E1165 Type 2 diabetes mellitus with hyperglycemia: Secondary | ICD-10-CM | POA: Diagnosis not present

## 2022-04-13 DIAGNOSIS — Z87891 Personal history of nicotine dependence: Secondary | ICD-10-CM | POA: Diagnosis not present

## 2022-04-13 DIAGNOSIS — Z299 Encounter for prophylactic measures, unspecified: Secondary | ICD-10-CM | POA: Diagnosis not present

## 2022-04-13 DIAGNOSIS — Z993 Dependence on wheelchair: Secondary | ICD-10-CM | POA: Diagnosis not present

## 2022-04-13 DIAGNOSIS — Z981 Arthrodesis status: Secondary | ICD-10-CM | POA: Diagnosis not present

## 2022-04-13 DIAGNOSIS — R29898 Other symptoms and signs involving the musculoskeletal system: Secondary | ICD-10-CM | POA: Diagnosis not present

## 2022-04-13 DIAGNOSIS — J449 Chronic obstructive pulmonary disease, unspecified: Secondary | ICD-10-CM | POA: Diagnosis not present

## 2022-04-13 DIAGNOSIS — L89229 Pressure ulcer of left hip, unspecified stage: Secondary | ICD-10-CM | POA: Diagnosis not present

## 2022-04-13 DIAGNOSIS — S30811A Abrasion of abdominal wall, initial encounter: Secondary | ICD-10-CM | POA: Diagnosis not present

## 2022-04-13 DIAGNOSIS — Z7984 Long term (current) use of oral hypoglycemic drugs: Secondary | ICD-10-CM | POA: Diagnosis not present

## 2022-04-13 DIAGNOSIS — E1142 Type 2 diabetes mellitus with diabetic polyneuropathy: Secondary | ICD-10-CM | POA: Diagnosis not present

## 2022-04-13 DIAGNOSIS — Z7722 Contact with and (suspected) exposure to environmental tobacco smoke (acute) (chronic): Secondary | ICD-10-CM | POA: Diagnosis not present

## 2022-04-13 DIAGNOSIS — L89220 Pressure ulcer of left hip, unstageable: Secondary | ICD-10-CM | POA: Diagnosis not present

## 2022-04-13 DIAGNOSIS — Z79899 Other long term (current) drug therapy: Secondary | ICD-10-CM | POA: Diagnosis not present

## 2022-04-13 DIAGNOSIS — Z96641 Presence of right artificial hip joint: Secondary | ICD-10-CM | POA: Diagnosis not present

## 2022-04-13 DIAGNOSIS — Z66 Do not resuscitate: Secondary | ICD-10-CM | POA: Diagnosis not present

## 2022-04-13 DIAGNOSIS — E7800 Pure hypercholesterolemia, unspecified: Secondary | ICD-10-CM | POA: Diagnosis not present

## 2022-04-13 DIAGNOSIS — M545 Low back pain, unspecified: Secondary | ICD-10-CM | POA: Diagnosis not present

## 2022-04-13 DIAGNOSIS — K2100 Gastro-esophageal reflux disease with esophagitis, without bleeding: Secondary | ICD-10-CM | POA: Diagnosis not present

## 2022-04-13 DIAGNOSIS — I10 Essential (primary) hypertension: Secondary | ICD-10-CM | POA: Diagnosis not present

## 2022-04-14 DIAGNOSIS — G9589 Other specified diseases of spinal cord: Secondary | ICD-10-CM | POA: Diagnosis not present

## 2022-04-14 DIAGNOSIS — M4802 Spinal stenosis, cervical region: Secondary | ICD-10-CM | POA: Diagnosis not present

## 2022-04-14 DIAGNOSIS — M4804 Spinal stenosis, thoracic region: Secondary | ICD-10-CM | POA: Diagnosis not present

## 2022-04-14 DIAGNOSIS — M5134 Other intervertebral disc degeneration, thoracic region: Secondary | ICD-10-CM | POA: Diagnosis not present

## 2022-04-14 DIAGNOSIS — M5124 Other intervertebral disc displacement, thoracic region: Secondary | ICD-10-CM | POA: Diagnosis not present

## 2022-04-14 DIAGNOSIS — M47814 Spondylosis without myelopathy or radiculopathy, thoracic region: Secondary | ICD-10-CM | POA: Diagnosis not present

## 2022-04-14 DIAGNOSIS — G9520 Unspecified cord compression: Secondary | ICD-10-CM | POA: Diagnosis not present

## 2022-04-15 DIAGNOSIS — Z7984 Long term (current) use of oral hypoglycemic drugs: Secondary | ICD-10-CM | POA: Diagnosis not present

## 2022-04-15 DIAGNOSIS — Z4803 Encounter for change or removal of drains: Secondary | ICD-10-CM | POA: Diagnosis not present

## 2022-04-15 DIAGNOSIS — E1140 Type 2 diabetes mellitus with diabetic neuropathy, unspecified: Secondary | ICD-10-CM | POA: Diagnosis not present

## 2022-04-15 DIAGNOSIS — L89220 Pressure ulcer of left hip, unstageable: Secondary | ICD-10-CM | POA: Diagnosis not present

## 2022-04-15 DIAGNOSIS — Z7982 Long term (current) use of aspirin: Secondary | ICD-10-CM | POA: Diagnosis not present

## 2022-04-15 DIAGNOSIS — K219 Gastro-esophageal reflux disease without esophagitis: Secondary | ICD-10-CM | POA: Diagnosis not present

## 2022-04-15 DIAGNOSIS — E876 Hypokalemia: Secondary | ICD-10-CM | POA: Diagnosis not present

## 2022-04-15 DIAGNOSIS — E785 Hyperlipidemia, unspecified: Secondary | ICD-10-CM | POA: Diagnosis not present

## 2022-04-15 DIAGNOSIS — Z791 Long term (current) use of non-steroidal anti-inflammatories (NSAID): Secondary | ICD-10-CM | POA: Diagnosis not present

## 2022-04-15 DIAGNOSIS — Z993 Dependence on wheelchair: Secondary | ICD-10-CM | POA: Diagnosis not present

## 2022-04-15 DIAGNOSIS — Z79891 Long term (current) use of opiate analgesic: Secondary | ICD-10-CM | POA: Diagnosis not present

## 2022-04-15 DIAGNOSIS — G8929 Other chronic pain: Secondary | ICD-10-CM | POA: Diagnosis not present

## 2022-04-16 DIAGNOSIS — G9341 Metabolic encephalopathy: Secondary | ICD-10-CM | POA: Diagnosis not present

## 2022-04-19 DIAGNOSIS — E876 Hypokalemia: Secondary | ICD-10-CM | POA: Diagnosis not present

## 2022-04-19 DIAGNOSIS — Z7984 Long term (current) use of oral hypoglycemic drugs: Secondary | ICD-10-CM | POA: Diagnosis not present

## 2022-04-19 DIAGNOSIS — K219 Gastro-esophageal reflux disease without esophagitis: Secondary | ICD-10-CM | POA: Diagnosis not present

## 2022-04-19 DIAGNOSIS — Z4803 Encounter for change or removal of drains: Secondary | ICD-10-CM | POA: Diagnosis not present

## 2022-04-19 DIAGNOSIS — L89220 Pressure ulcer of left hip, unstageable: Secondary | ICD-10-CM | POA: Diagnosis not present

## 2022-04-19 DIAGNOSIS — Z7982 Long term (current) use of aspirin: Secondary | ICD-10-CM | POA: Diagnosis not present

## 2022-04-19 DIAGNOSIS — E785 Hyperlipidemia, unspecified: Secondary | ICD-10-CM | POA: Diagnosis not present

## 2022-04-19 DIAGNOSIS — Z79891 Long term (current) use of opiate analgesic: Secondary | ICD-10-CM | POA: Diagnosis not present

## 2022-04-19 DIAGNOSIS — E1140 Type 2 diabetes mellitus with diabetic neuropathy, unspecified: Secondary | ICD-10-CM | POA: Diagnosis not present

## 2022-04-19 DIAGNOSIS — Z791 Long term (current) use of non-steroidal anti-inflammatories (NSAID): Secondary | ICD-10-CM | POA: Diagnosis not present

## 2022-04-19 DIAGNOSIS — G8929 Other chronic pain: Secondary | ICD-10-CM | POA: Diagnosis not present

## 2022-04-19 DIAGNOSIS — Z993 Dependence on wheelchair: Secondary | ICD-10-CM | POA: Diagnosis not present

## 2022-04-22 DIAGNOSIS — E876 Hypokalemia: Secondary | ICD-10-CM | POA: Diagnosis not present

## 2022-04-22 DIAGNOSIS — Z7984 Long term (current) use of oral hypoglycemic drugs: Secondary | ICD-10-CM | POA: Diagnosis not present

## 2022-04-22 DIAGNOSIS — K219 Gastro-esophageal reflux disease without esophagitis: Secondary | ICD-10-CM | POA: Diagnosis not present

## 2022-04-22 DIAGNOSIS — Z993 Dependence on wheelchair: Secondary | ICD-10-CM | POA: Diagnosis not present

## 2022-04-22 DIAGNOSIS — Z4803 Encounter for change or removal of drains: Secondary | ICD-10-CM | POA: Diagnosis not present

## 2022-04-22 DIAGNOSIS — Z791 Long term (current) use of non-steroidal anti-inflammatories (NSAID): Secondary | ICD-10-CM | POA: Diagnosis not present

## 2022-04-22 DIAGNOSIS — Z7982 Long term (current) use of aspirin: Secondary | ICD-10-CM | POA: Diagnosis not present

## 2022-04-22 DIAGNOSIS — G8929 Other chronic pain: Secondary | ICD-10-CM | POA: Diagnosis not present

## 2022-04-22 DIAGNOSIS — Z79891 Long term (current) use of opiate analgesic: Secondary | ICD-10-CM | POA: Diagnosis not present

## 2022-04-22 DIAGNOSIS — L89220 Pressure ulcer of left hip, unstageable: Secondary | ICD-10-CM | POA: Diagnosis not present

## 2022-04-22 DIAGNOSIS — E785 Hyperlipidemia, unspecified: Secondary | ICD-10-CM | POA: Diagnosis not present

## 2022-04-22 DIAGNOSIS — E1140 Type 2 diabetes mellitus with diabetic neuropathy, unspecified: Secondary | ICD-10-CM | POA: Diagnosis not present

## 2022-04-23 DIAGNOSIS — Z4803 Encounter for change or removal of drains: Secondary | ICD-10-CM | POA: Diagnosis not present

## 2022-04-23 DIAGNOSIS — G8929 Other chronic pain: Secondary | ICD-10-CM | POA: Diagnosis not present

## 2022-04-23 DIAGNOSIS — Z791 Long term (current) use of non-steroidal anti-inflammatories (NSAID): Secondary | ICD-10-CM | POA: Diagnosis not present

## 2022-04-23 DIAGNOSIS — E785 Hyperlipidemia, unspecified: Secondary | ICD-10-CM | POA: Diagnosis not present

## 2022-04-23 DIAGNOSIS — L89220 Pressure ulcer of left hip, unstageable: Secondary | ICD-10-CM | POA: Diagnosis not present

## 2022-04-23 DIAGNOSIS — K219 Gastro-esophageal reflux disease without esophagitis: Secondary | ICD-10-CM | POA: Diagnosis not present

## 2022-04-23 DIAGNOSIS — Z79891 Long term (current) use of opiate analgesic: Secondary | ICD-10-CM | POA: Diagnosis not present

## 2022-04-23 DIAGNOSIS — Z993 Dependence on wheelchair: Secondary | ICD-10-CM | POA: Diagnosis not present

## 2022-04-23 DIAGNOSIS — Z7982 Long term (current) use of aspirin: Secondary | ICD-10-CM | POA: Diagnosis not present

## 2022-04-23 DIAGNOSIS — Z7984 Long term (current) use of oral hypoglycemic drugs: Secondary | ICD-10-CM | POA: Diagnosis not present

## 2022-04-23 DIAGNOSIS — E1140 Type 2 diabetes mellitus with diabetic neuropathy, unspecified: Secondary | ICD-10-CM | POA: Diagnosis not present

## 2022-04-23 DIAGNOSIS — E876 Hypokalemia: Secondary | ICD-10-CM | POA: Diagnosis not present

## 2022-04-26 DIAGNOSIS — E876 Hypokalemia: Secondary | ICD-10-CM | POA: Diagnosis not present

## 2022-04-26 DIAGNOSIS — Z7982 Long term (current) use of aspirin: Secondary | ICD-10-CM | POA: Diagnosis not present

## 2022-04-26 DIAGNOSIS — K219 Gastro-esophageal reflux disease without esophagitis: Secondary | ICD-10-CM | POA: Diagnosis not present

## 2022-04-26 DIAGNOSIS — Z7984 Long term (current) use of oral hypoglycemic drugs: Secondary | ICD-10-CM | POA: Diagnosis not present

## 2022-04-26 DIAGNOSIS — Z79891 Long term (current) use of opiate analgesic: Secondary | ICD-10-CM | POA: Diagnosis not present

## 2022-04-26 DIAGNOSIS — E1140 Type 2 diabetes mellitus with diabetic neuropathy, unspecified: Secondary | ICD-10-CM | POA: Diagnosis not present

## 2022-04-26 DIAGNOSIS — Z791 Long term (current) use of non-steroidal anti-inflammatories (NSAID): Secondary | ICD-10-CM | POA: Diagnosis not present

## 2022-04-26 DIAGNOSIS — Z993 Dependence on wheelchair: Secondary | ICD-10-CM | POA: Diagnosis not present

## 2022-04-26 DIAGNOSIS — E785 Hyperlipidemia, unspecified: Secondary | ICD-10-CM | POA: Diagnosis not present

## 2022-04-26 DIAGNOSIS — L89220 Pressure ulcer of left hip, unstageable: Secondary | ICD-10-CM | POA: Diagnosis not present

## 2022-04-26 DIAGNOSIS — G8929 Other chronic pain: Secondary | ICD-10-CM | POA: Diagnosis not present

## 2022-04-26 DIAGNOSIS — Z4803 Encounter for change or removal of drains: Secondary | ICD-10-CM | POA: Diagnosis not present

## 2022-04-27 DIAGNOSIS — E7800 Pure hypercholesterolemia, unspecified: Secondary | ICD-10-CM | POA: Diagnosis not present

## 2022-04-27 DIAGNOSIS — Z7722 Contact with and (suspected) exposure to environmental tobacco smoke (acute) (chronic): Secondary | ICD-10-CM | POA: Diagnosis not present

## 2022-04-27 DIAGNOSIS — E1142 Type 2 diabetes mellitus with diabetic polyneuropathy: Secondary | ICD-10-CM | POA: Diagnosis not present

## 2022-04-27 DIAGNOSIS — Z9049 Acquired absence of other specified parts of digestive tract: Secondary | ICD-10-CM | POA: Diagnosis not present

## 2022-04-27 DIAGNOSIS — Z87891 Personal history of nicotine dependence: Secondary | ICD-10-CM | POA: Diagnosis not present

## 2022-04-27 DIAGNOSIS — Z96641 Presence of right artificial hip joint: Secondary | ICD-10-CM | POA: Diagnosis not present

## 2022-04-27 DIAGNOSIS — Z79899 Other long term (current) drug therapy: Secondary | ICD-10-CM | POA: Diagnosis not present

## 2022-04-27 DIAGNOSIS — S30811A Abrasion of abdominal wall, initial encounter: Secondary | ICD-10-CM | POA: Diagnosis not present

## 2022-04-27 DIAGNOSIS — J449 Chronic obstructive pulmonary disease, unspecified: Secondary | ICD-10-CM | POA: Diagnosis not present

## 2022-04-27 DIAGNOSIS — I10 Essential (primary) hypertension: Secondary | ICD-10-CM | POA: Diagnosis not present

## 2022-04-27 DIAGNOSIS — Z86711 Personal history of pulmonary embolism: Secondary | ICD-10-CM | POA: Diagnosis not present

## 2022-04-27 DIAGNOSIS — Z66 Do not resuscitate: Secondary | ICD-10-CM | POA: Diagnosis not present

## 2022-04-27 DIAGNOSIS — Z981 Arthrodesis status: Secondary | ICD-10-CM | POA: Diagnosis not present

## 2022-04-27 DIAGNOSIS — Z7984 Long term (current) use of oral hypoglycemic drugs: Secondary | ICD-10-CM | POA: Diagnosis not present

## 2022-04-27 DIAGNOSIS — Z993 Dependence on wheelchair: Secondary | ICD-10-CM | POA: Diagnosis not present

## 2022-04-27 DIAGNOSIS — L89229 Pressure ulcer of left hip, unspecified stage: Secondary | ICD-10-CM | POA: Diagnosis not present

## 2022-04-27 DIAGNOSIS — Z885 Allergy status to narcotic agent status: Secondary | ICD-10-CM | POA: Diagnosis not present

## 2022-04-27 DIAGNOSIS — M1991 Primary osteoarthritis, unspecified site: Secondary | ICD-10-CM | POA: Diagnosis not present

## 2022-04-27 DIAGNOSIS — Z888 Allergy status to other drugs, medicaments and biological substances status: Secondary | ICD-10-CM | POA: Diagnosis not present

## 2022-04-27 DIAGNOSIS — K2100 Gastro-esophageal reflux disease with esophagitis, without bleeding: Secondary | ICD-10-CM | POA: Diagnosis not present

## 2022-04-28 DIAGNOSIS — Z79891 Long term (current) use of opiate analgesic: Secondary | ICD-10-CM | POA: Diagnosis not present

## 2022-04-28 DIAGNOSIS — Z7984 Long term (current) use of oral hypoglycemic drugs: Secondary | ICD-10-CM | POA: Diagnosis not present

## 2022-04-28 DIAGNOSIS — E1140 Type 2 diabetes mellitus with diabetic neuropathy, unspecified: Secondary | ICD-10-CM | POA: Diagnosis not present

## 2022-04-28 DIAGNOSIS — Z993 Dependence on wheelchair: Secondary | ICD-10-CM | POA: Diagnosis not present

## 2022-04-28 DIAGNOSIS — Z4803 Encounter for change or removal of drains: Secondary | ICD-10-CM | POA: Diagnosis not present

## 2022-04-28 DIAGNOSIS — Z7982 Long term (current) use of aspirin: Secondary | ICD-10-CM | POA: Diagnosis not present

## 2022-04-28 DIAGNOSIS — E785 Hyperlipidemia, unspecified: Secondary | ICD-10-CM | POA: Diagnosis not present

## 2022-04-28 DIAGNOSIS — Z791 Long term (current) use of non-steroidal anti-inflammatories (NSAID): Secondary | ICD-10-CM | POA: Diagnosis not present

## 2022-04-28 DIAGNOSIS — E876 Hypokalemia: Secondary | ICD-10-CM | POA: Diagnosis not present

## 2022-04-28 DIAGNOSIS — L89220 Pressure ulcer of left hip, unstageable: Secondary | ICD-10-CM | POA: Diagnosis not present

## 2022-04-28 DIAGNOSIS — K219 Gastro-esophageal reflux disease without esophagitis: Secondary | ICD-10-CM | POA: Diagnosis not present

## 2022-04-28 DIAGNOSIS — G8929 Other chronic pain: Secondary | ICD-10-CM | POA: Diagnosis not present

## 2022-04-29 DIAGNOSIS — G8929 Other chronic pain: Secondary | ICD-10-CM | POA: Diagnosis not present

## 2022-04-29 DIAGNOSIS — Z4803 Encounter for change or removal of drains: Secondary | ICD-10-CM | POA: Diagnosis not present

## 2022-04-29 DIAGNOSIS — L89220 Pressure ulcer of left hip, unstageable: Secondary | ICD-10-CM | POA: Diagnosis not present

## 2022-04-29 DIAGNOSIS — Z79891 Long term (current) use of opiate analgesic: Secondary | ICD-10-CM | POA: Diagnosis not present

## 2022-04-29 DIAGNOSIS — K219 Gastro-esophageal reflux disease without esophagitis: Secondary | ICD-10-CM | POA: Diagnosis not present

## 2022-04-29 DIAGNOSIS — E876 Hypokalemia: Secondary | ICD-10-CM | POA: Diagnosis not present

## 2022-04-29 DIAGNOSIS — Z791 Long term (current) use of non-steroidal anti-inflammatories (NSAID): Secondary | ICD-10-CM | POA: Diagnosis not present

## 2022-04-29 DIAGNOSIS — E1140 Type 2 diabetes mellitus with diabetic neuropathy, unspecified: Secondary | ICD-10-CM | POA: Diagnosis not present

## 2022-04-29 DIAGNOSIS — Z993 Dependence on wheelchair: Secondary | ICD-10-CM | POA: Diagnosis not present

## 2022-04-29 DIAGNOSIS — E785 Hyperlipidemia, unspecified: Secondary | ICD-10-CM | POA: Diagnosis not present

## 2022-04-29 DIAGNOSIS — Z7984 Long term (current) use of oral hypoglycemic drugs: Secondary | ICD-10-CM | POA: Diagnosis not present

## 2022-04-29 DIAGNOSIS — Z7982 Long term (current) use of aspirin: Secondary | ICD-10-CM | POA: Diagnosis not present

## 2022-05-03 DIAGNOSIS — Z79891 Long term (current) use of opiate analgesic: Secondary | ICD-10-CM | POA: Diagnosis not present

## 2022-05-03 DIAGNOSIS — Z791 Long term (current) use of non-steroidal anti-inflammatories (NSAID): Secondary | ICD-10-CM | POA: Diagnosis not present

## 2022-05-03 DIAGNOSIS — L89220 Pressure ulcer of left hip, unstageable: Secondary | ICD-10-CM | POA: Diagnosis not present

## 2022-05-03 DIAGNOSIS — G8929 Other chronic pain: Secondary | ICD-10-CM | POA: Diagnosis not present

## 2022-05-03 DIAGNOSIS — E785 Hyperlipidemia, unspecified: Secondary | ICD-10-CM | POA: Diagnosis not present

## 2022-05-03 DIAGNOSIS — E876 Hypokalemia: Secondary | ICD-10-CM | POA: Diagnosis not present

## 2022-05-03 DIAGNOSIS — Z7982 Long term (current) use of aspirin: Secondary | ICD-10-CM | POA: Diagnosis not present

## 2022-05-03 DIAGNOSIS — Z7984 Long term (current) use of oral hypoglycemic drugs: Secondary | ICD-10-CM | POA: Diagnosis not present

## 2022-05-03 DIAGNOSIS — E1140 Type 2 diabetes mellitus with diabetic neuropathy, unspecified: Secondary | ICD-10-CM | POA: Diagnosis not present

## 2022-05-03 DIAGNOSIS — K219 Gastro-esophageal reflux disease without esophagitis: Secondary | ICD-10-CM | POA: Diagnosis not present

## 2022-05-03 DIAGNOSIS — Z993 Dependence on wheelchair: Secondary | ICD-10-CM | POA: Diagnosis not present

## 2022-05-03 DIAGNOSIS — Z4803 Encounter for change or removal of drains: Secondary | ICD-10-CM | POA: Diagnosis not present

## 2022-05-04 ENCOUNTER — Telehealth: Payer: Self-pay | Admitting: "Endocrinology

## 2022-05-04 NOTE — Telephone Encounter (Signed)
New message    Patient calling with blood sugar reading  Yesterday 332 @ 9:30am   Today 448 @ 10:45am

## 2022-05-04 NOTE — Telephone Encounter (Signed)
Pt states he's checking his BG once a day. He takes glipizide 5mg  bid and metformin 500mg  daily.

## 2022-05-05 DIAGNOSIS — E785 Hyperlipidemia, unspecified: Secondary | ICD-10-CM | POA: Diagnosis not present

## 2022-05-05 DIAGNOSIS — K219 Gastro-esophageal reflux disease without esophagitis: Secondary | ICD-10-CM | POA: Diagnosis not present

## 2022-05-05 DIAGNOSIS — L89220 Pressure ulcer of left hip, unstageable: Secondary | ICD-10-CM | POA: Diagnosis not present

## 2022-05-05 DIAGNOSIS — Z79891 Long term (current) use of opiate analgesic: Secondary | ICD-10-CM | POA: Diagnosis not present

## 2022-05-05 DIAGNOSIS — E1140 Type 2 diabetes mellitus with diabetic neuropathy, unspecified: Secondary | ICD-10-CM | POA: Diagnosis not present

## 2022-05-05 DIAGNOSIS — G8929 Other chronic pain: Secondary | ICD-10-CM | POA: Diagnosis not present

## 2022-05-05 DIAGNOSIS — Z7984 Long term (current) use of oral hypoglycemic drugs: Secondary | ICD-10-CM | POA: Diagnosis not present

## 2022-05-05 DIAGNOSIS — Z791 Long term (current) use of non-steroidal anti-inflammatories (NSAID): Secondary | ICD-10-CM | POA: Diagnosis not present

## 2022-05-05 DIAGNOSIS — E876 Hypokalemia: Secondary | ICD-10-CM | POA: Diagnosis not present

## 2022-05-05 DIAGNOSIS — Z4803 Encounter for change or removal of drains: Secondary | ICD-10-CM | POA: Diagnosis not present

## 2022-05-05 DIAGNOSIS — Z7982 Long term (current) use of aspirin: Secondary | ICD-10-CM | POA: Diagnosis not present

## 2022-05-05 DIAGNOSIS — Z993 Dependence on wheelchair: Secondary | ICD-10-CM | POA: Diagnosis not present

## 2022-05-05 NOTE — Telephone Encounter (Signed)
Left a message making pt aware of upcoming appointment on 05-12-22 at 3:00p.m.

## 2022-05-05 NOTE — Telephone Encounter (Signed)
Discussed with pt, understanding voiced. 

## 2022-05-06 DIAGNOSIS — R0902 Hypoxemia: Secondary | ICD-10-CM | POA: Diagnosis not present

## 2022-05-06 DIAGNOSIS — Z791 Long term (current) use of non-steroidal anti-inflammatories (NSAID): Secondary | ICD-10-CM | POA: Diagnosis not present

## 2022-05-06 DIAGNOSIS — G319 Degenerative disease of nervous system, unspecified: Secondary | ICD-10-CM | POA: Diagnosis not present

## 2022-05-06 DIAGNOSIS — R531 Weakness: Secondary | ICD-10-CM | POA: Diagnosis not present

## 2022-05-06 DIAGNOSIS — E1142 Type 2 diabetes mellitus with diabetic polyneuropathy: Secondary | ICD-10-CM | POA: Diagnosis not present

## 2022-05-06 DIAGNOSIS — G9341 Metabolic encephalopathy: Secondary | ICD-10-CM | POA: Diagnosis not present

## 2022-05-06 DIAGNOSIS — N3000 Acute cystitis without hematuria: Secondary | ICD-10-CM | POA: Diagnosis not present

## 2022-05-06 DIAGNOSIS — Z792 Long term (current) use of antibiotics: Secondary | ICD-10-CM | POA: Diagnosis not present

## 2022-05-06 DIAGNOSIS — G8220 Paraplegia, unspecified: Secondary | ICD-10-CM | POA: Diagnosis not present

## 2022-05-06 DIAGNOSIS — N182 Chronic kidney disease, stage 2 (mild): Secondary | ICD-10-CM | POA: Diagnosis not present

## 2022-05-06 DIAGNOSIS — Z4803 Encounter for change or removal of drains: Secondary | ICD-10-CM | POA: Diagnosis not present

## 2022-05-06 DIAGNOSIS — J449 Chronic obstructive pulmonary disease, unspecified: Secondary | ICD-10-CM | POA: Diagnosis not present

## 2022-05-06 DIAGNOSIS — I10 Essential (primary) hypertension: Secondary | ICD-10-CM | POA: Diagnosis not present

## 2022-05-06 DIAGNOSIS — G8310 Monoplegia of lower limb affecting unspecified side: Secondary | ICD-10-CM | POA: Insufficient documentation

## 2022-05-06 DIAGNOSIS — N390 Urinary tract infection, site not specified: Secondary | ICD-10-CM | POA: Diagnosis not present

## 2022-05-06 DIAGNOSIS — L89150 Pressure ulcer of sacral region, unstageable: Secondary | ICD-10-CM | POA: Diagnosis not present

## 2022-05-06 DIAGNOSIS — E785 Hyperlipidemia, unspecified: Secondary | ICD-10-CM | POA: Diagnosis not present

## 2022-05-06 DIAGNOSIS — Z79899 Other long term (current) drug therapy: Secondary | ICD-10-CM | POA: Diagnosis not present

## 2022-05-06 DIAGNOSIS — K219 Gastro-esophageal reflux disease without esophagitis: Secondary | ICD-10-CM | POA: Diagnosis not present

## 2022-05-06 DIAGNOSIS — L89220 Pressure ulcer of left hip, unstageable: Secondary | ICD-10-CM | POA: Diagnosis not present

## 2022-05-06 DIAGNOSIS — G9589 Other specified diseases of spinal cord: Secondary | ICD-10-CM | POA: Diagnosis not present

## 2022-05-06 DIAGNOSIS — R410 Disorientation, unspecified: Secondary | ICD-10-CM | POA: Diagnosis not present

## 2022-05-06 DIAGNOSIS — Z7984 Long term (current) use of oral hypoglycemic drugs: Secondary | ICD-10-CM | POA: Diagnosis not present

## 2022-05-06 DIAGNOSIS — L89151 Pressure ulcer of sacral region, stage 1: Secondary | ICD-10-CM | POA: Diagnosis not present

## 2022-05-06 DIAGNOSIS — E1122 Type 2 diabetes mellitus with diabetic chronic kidney disease: Secondary | ICD-10-CM | POA: Diagnosis not present

## 2022-05-06 DIAGNOSIS — K2100 Gastro-esophageal reflux disease with esophagitis, without bleeding: Secondary | ICD-10-CM | POA: Diagnosis not present

## 2022-05-06 DIAGNOSIS — E1140 Type 2 diabetes mellitus with diabetic neuropathy, unspecified: Secondary | ICD-10-CM | POA: Diagnosis not present

## 2022-05-06 DIAGNOSIS — L89229 Pressure ulcer of left hip, unspecified stage: Secondary | ICD-10-CM | POA: Diagnosis not present

## 2022-05-06 DIAGNOSIS — Z993 Dependence on wheelchair: Secondary | ICD-10-CM | POA: Diagnosis not present

## 2022-05-06 DIAGNOSIS — R4781 Slurred speech: Secondary | ICD-10-CM | POA: Diagnosis not present

## 2022-05-06 DIAGNOSIS — G8929 Other chronic pain: Secondary | ICD-10-CM | POA: Diagnosis not present

## 2022-05-06 DIAGNOSIS — Z7982 Long term (current) use of aspirin: Secondary | ICD-10-CM | POA: Diagnosis not present

## 2022-05-06 DIAGNOSIS — M4716 Other spondylosis with myelopathy, lumbar region: Secondary | ICD-10-CM | POA: Diagnosis not present

## 2022-05-06 DIAGNOSIS — L89224 Pressure ulcer of left hip, stage 4: Secondary | ICD-10-CM | POA: Diagnosis not present

## 2022-05-06 DIAGNOSIS — R54 Age-related physical debility: Secondary | ICD-10-CM | POA: Diagnosis not present

## 2022-05-06 DIAGNOSIS — Z87891 Personal history of nicotine dependence: Secondary | ICD-10-CM | POA: Diagnosis not present

## 2022-05-06 DIAGNOSIS — Z79891 Long term (current) use of opiate analgesic: Secondary | ICD-10-CM | POA: Diagnosis not present

## 2022-05-06 DIAGNOSIS — G839 Paralytic syndrome, unspecified: Secondary | ICD-10-CM | POA: Diagnosis not present

## 2022-05-06 DIAGNOSIS — E876 Hypokalemia: Secondary | ICD-10-CM | POA: Diagnosis not present

## 2022-05-06 DIAGNOSIS — N179 Acute kidney failure, unspecified: Secondary | ICD-10-CM | POA: Diagnosis not present

## 2022-05-06 DIAGNOSIS — I129 Hypertensive chronic kidney disease with stage 1 through stage 4 chronic kidney disease, or unspecified chronic kidney disease: Secondary | ICD-10-CM | POA: Diagnosis not present

## 2022-05-06 DIAGNOSIS — R55 Syncope and collapse: Secondary | ICD-10-CM | POA: Diagnosis not present

## 2022-05-06 DIAGNOSIS — Z66 Do not resuscitate: Secondary | ICD-10-CM | POA: Diagnosis not present

## 2022-05-06 DIAGNOSIS — M4800 Spinal stenosis, site unspecified: Secondary | ICD-10-CM | POA: Diagnosis not present

## 2022-05-06 DIAGNOSIS — E7800 Pure hypercholesterolemia, unspecified: Secondary | ICD-10-CM | POA: Diagnosis not present

## 2022-05-06 NOTE — Telephone Encounter (Signed)
Patient made aware.

## 2022-05-09 DIAGNOSIS — N182 Chronic kidney disease, stage 2 (mild): Secondary | ICD-10-CM | POA: Diagnosis not present

## 2022-05-09 DIAGNOSIS — Z792 Long term (current) use of antibiotics: Secondary | ICD-10-CM | POA: Diagnosis not present

## 2022-05-09 DIAGNOSIS — G9589 Other specified diseases of spinal cord: Secondary | ICD-10-CM | POA: Diagnosis not present

## 2022-05-09 DIAGNOSIS — M4800 Spinal stenosis, site unspecified: Secondary | ICD-10-CM | POA: Diagnosis not present

## 2022-05-09 DIAGNOSIS — R54 Age-related physical debility: Secondary | ICD-10-CM | POA: Diagnosis not present

## 2022-05-09 DIAGNOSIS — L89224 Pressure ulcer of left hip, stage 4: Secondary | ICD-10-CM | POA: Diagnosis not present

## 2022-05-09 DIAGNOSIS — N179 Acute kidney failure, unspecified: Secondary | ICD-10-CM | POA: Diagnosis not present

## 2022-05-09 DIAGNOSIS — L89151 Pressure ulcer of sacral region, stage 1: Secondary | ICD-10-CM | POA: Diagnosis not present

## 2022-05-09 DIAGNOSIS — G8220 Paraplegia, unspecified: Secondary | ICD-10-CM | POA: Diagnosis not present

## 2022-05-09 DIAGNOSIS — I129 Hypertensive chronic kidney disease with stage 1 through stage 4 chronic kidney disease, or unspecified chronic kidney disease: Secondary | ICD-10-CM | POA: Diagnosis not present

## 2022-05-09 DIAGNOSIS — N3000 Acute cystitis without hematuria: Secondary | ICD-10-CM | POA: Diagnosis not present

## 2022-05-10 DIAGNOSIS — N3000 Acute cystitis without hematuria: Secondary | ICD-10-CM | POA: Diagnosis not present

## 2022-05-10 DIAGNOSIS — I129 Hypertensive chronic kidney disease with stage 1 through stage 4 chronic kidney disease, or unspecified chronic kidney disease: Secondary | ICD-10-CM | POA: Diagnosis not present

## 2022-05-10 DIAGNOSIS — G9589 Other specified diseases of spinal cord: Secondary | ICD-10-CM | POA: Diagnosis not present

## 2022-05-10 DIAGNOSIS — N179 Acute kidney failure, unspecified: Secondary | ICD-10-CM | POA: Diagnosis not present

## 2022-05-10 DIAGNOSIS — M4800 Spinal stenosis, site unspecified: Secondary | ICD-10-CM | POA: Diagnosis not present

## 2022-05-10 DIAGNOSIS — N182 Chronic kidney disease, stage 2 (mild): Secondary | ICD-10-CM | POA: Diagnosis not present

## 2022-05-10 DIAGNOSIS — L89224 Pressure ulcer of left hip, stage 4: Secondary | ICD-10-CM | POA: Diagnosis not present

## 2022-05-10 DIAGNOSIS — E876 Hypokalemia: Secondary | ICD-10-CM | POA: Diagnosis not present

## 2022-05-10 DIAGNOSIS — G839 Paralytic syndrome, unspecified: Secondary | ICD-10-CM | POA: Diagnosis not present

## 2022-05-10 DIAGNOSIS — R54 Age-related physical debility: Secondary | ICD-10-CM | POA: Diagnosis not present

## 2022-05-10 DIAGNOSIS — L89151 Pressure ulcer of sacral region, stage 1: Secondary | ICD-10-CM | POA: Diagnosis not present

## 2022-05-11 DIAGNOSIS — I129 Hypertensive chronic kidney disease with stage 1 through stage 4 chronic kidney disease, or unspecified chronic kidney disease: Secondary | ICD-10-CM | POA: Diagnosis not present

## 2022-05-11 DIAGNOSIS — N3000 Acute cystitis without hematuria: Secondary | ICD-10-CM | POA: Diagnosis not present

## 2022-05-11 DIAGNOSIS — R54 Age-related physical debility: Secondary | ICD-10-CM | POA: Diagnosis not present

## 2022-05-11 DIAGNOSIS — L89224 Pressure ulcer of left hip, stage 4: Secondary | ICD-10-CM | POA: Diagnosis not present

## 2022-05-11 DIAGNOSIS — N179 Acute kidney failure, unspecified: Secondary | ICD-10-CM | POA: Diagnosis not present

## 2022-05-11 DIAGNOSIS — M4800 Spinal stenosis, site unspecified: Secondary | ICD-10-CM | POA: Diagnosis not present

## 2022-05-11 DIAGNOSIS — N182 Chronic kidney disease, stage 2 (mild): Secondary | ICD-10-CM | POA: Diagnosis not present

## 2022-05-11 DIAGNOSIS — G9589 Other specified diseases of spinal cord: Secondary | ICD-10-CM | POA: Diagnosis not present

## 2022-05-11 DIAGNOSIS — Z79899 Other long term (current) drug therapy: Secondary | ICD-10-CM | POA: Diagnosis not present

## 2022-05-11 DIAGNOSIS — L89151 Pressure ulcer of sacral region, stage 1: Secondary | ICD-10-CM | POA: Diagnosis not present

## 2022-05-12 ENCOUNTER — Ambulatory Visit: Payer: Medicare Other | Admitting: "Endocrinology

## 2022-05-12 DIAGNOSIS — L89220 Pressure ulcer of left hip, unstageable: Secondary | ICD-10-CM | POA: Diagnosis not present

## 2022-05-12 DIAGNOSIS — Z7984 Long term (current) use of oral hypoglycemic drugs: Secondary | ICD-10-CM | POA: Diagnosis not present

## 2022-05-12 DIAGNOSIS — E1165 Type 2 diabetes mellitus with hyperglycemia: Secondary | ICD-10-CM | POA: Diagnosis not present

## 2022-05-12 DIAGNOSIS — I10 Essential (primary) hypertension: Secondary | ICD-10-CM | POA: Diagnosis not present

## 2022-05-12 DIAGNOSIS — E876 Hypokalemia: Secondary | ICD-10-CM | POA: Diagnosis not present

## 2022-05-12 DIAGNOSIS — Z299 Encounter for prophylactic measures, unspecified: Secondary | ICD-10-CM | POA: Diagnosis not present

## 2022-05-12 DIAGNOSIS — Z4803 Encounter for change or removal of drains: Secondary | ICD-10-CM | POA: Diagnosis not present

## 2022-05-12 DIAGNOSIS — E7800 Pure hypercholesterolemia, unspecified: Secondary | ICD-10-CM | POA: Diagnosis not present

## 2022-05-12 DIAGNOSIS — Z7982 Long term (current) use of aspirin: Secondary | ICD-10-CM | POA: Diagnosis not present

## 2022-05-12 DIAGNOSIS — Z791 Long term (current) use of non-steroidal anti-inflammatories (NSAID): Secondary | ICD-10-CM | POA: Diagnosis not present

## 2022-05-12 DIAGNOSIS — E119 Type 2 diabetes mellitus without complications: Secondary | ICD-10-CM | POA: Diagnosis not present

## 2022-05-12 DIAGNOSIS — E785 Hyperlipidemia, unspecified: Secondary | ICD-10-CM | POA: Diagnosis not present

## 2022-05-12 DIAGNOSIS — Z79891 Long term (current) use of opiate analgesic: Secondary | ICD-10-CM | POA: Diagnosis not present

## 2022-05-12 DIAGNOSIS — Z993 Dependence on wheelchair: Secondary | ICD-10-CM | POA: Diagnosis not present

## 2022-05-12 DIAGNOSIS — K219 Gastro-esophageal reflux disease without esophagitis: Secondary | ICD-10-CM | POA: Diagnosis not present

## 2022-05-12 DIAGNOSIS — G8929 Other chronic pain: Secondary | ICD-10-CM | POA: Diagnosis not present

## 2022-05-12 DIAGNOSIS — E1140 Type 2 diabetes mellitus with diabetic neuropathy, unspecified: Secondary | ICD-10-CM | POA: Diagnosis not present

## 2022-05-12 DIAGNOSIS — N390 Urinary tract infection, site not specified: Secondary | ICD-10-CM | POA: Diagnosis not present

## 2022-05-13 DIAGNOSIS — Z791 Long term (current) use of non-steroidal anti-inflammatories (NSAID): Secondary | ICD-10-CM | POA: Diagnosis not present

## 2022-05-13 DIAGNOSIS — E1140 Type 2 diabetes mellitus with diabetic neuropathy, unspecified: Secondary | ICD-10-CM | POA: Diagnosis not present

## 2022-05-13 DIAGNOSIS — L89220 Pressure ulcer of left hip, unstageable: Secondary | ICD-10-CM | POA: Diagnosis not present

## 2022-05-13 DIAGNOSIS — K219 Gastro-esophageal reflux disease without esophagitis: Secondary | ICD-10-CM | POA: Diagnosis not present

## 2022-05-13 DIAGNOSIS — Z7984 Long term (current) use of oral hypoglycemic drugs: Secondary | ICD-10-CM | POA: Diagnosis not present

## 2022-05-13 DIAGNOSIS — E785 Hyperlipidemia, unspecified: Secondary | ICD-10-CM | POA: Diagnosis not present

## 2022-05-13 DIAGNOSIS — Z993 Dependence on wheelchair: Secondary | ICD-10-CM | POA: Diagnosis not present

## 2022-05-13 DIAGNOSIS — Z7982 Long term (current) use of aspirin: Secondary | ICD-10-CM | POA: Diagnosis not present

## 2022-05-13 DIAGNOSIS — Z4803 Encounter for change or removal of drains: Secondary | ICD-10-CM | POA: Diagnosis not present

## 2022-05-13 DIAGNOSIS — Z79891 Long term (current) use of opiate analgesic: Secondary | ICD-10-CM | POA: Diagnosis not present

## 2022-05-13 DIAGNOSIS — G8929 Other chronic pain: Secondary | ICD-10-CM | POA: Diagnosis not present

## 2022-05-13 DIAGNOSIS — E876 Hypokalemia: Secondary | ICD-10-CM | POA: Diagnosis not present

## 2022-05-17 ENCOUNTER — Ambulatory Visit: Payer: Medicare Other | Admitting: "Endocrinology

## 2022-05-17 ENCOUNTER — Encounter: Payer: Self-pay | Admitting: "Endocrinology

## 2022-05-17 VITALS — BP 102/60 | HR 72 | Ht 66.0 in

## 2022-05-17 DIAGNOSIS — G9341 Metabolic encephalopathy: Secondary | ICD-10-CM | POA: Diagnosis not present

## 2022-05-17 DIAGNOSIS — Z993 Dependence on wheelchair: Secondary | ICD-10-CM | POA: Diagnosis not present

## 2022-05-17 DIAGNOSIS — Z7984 Long term (current) use of oral hypoglycemic drugs: Secondary | ICD-10-CM | POA: Diagnosis not present

## 2022-05-17 DIAGNOSIS — E876 Hypokalemia: Secondary | ICD-10-CM | POA: Diagnosis not present

## 2022-05-17 DIAGNOSIS — E782 Mixed hyperlipidemia: Secondary | ICD-10-CM

## 2022-05-17 DIAGNOSIS — G8929 Other chronic pain: Secondary | ICD-10-CM | POA: Diagnosis not present

## 2022-05-17 DIAGNOSIS — I10 Essential (primary) hypertension: Secondary | ICD-10-CM | POA: Diagnosis not present

## 2022-05-17 DIAGNOSIS — Z4803 Encounter for change or removal of drains: Secondary | ICD-10-CM | POA: Diagnosis not present

## 2022-05-17 DIAGNOSIS — E119 Type 2 diabetes mellitus without complications: Secondary | ICD-10-CM | POA: Diagnosis not present

## 2022-05-17 DIAGNOSIS — E1140 Type 2 diabetes mellitus with diabetic neuropathy, unspecified: Secondary | ICD-10-CM | POA: Diagnosis not present

## 2022-05-17 DIAGNOSIS — K219 Gastro-esophageal reflux disease without esophagitis: Secondary | ICD-10-CM | POA: Diagnosis not present

## 2022-05-17 DIAGNOSIS — Z7982 Long term (current) use of aspirin: Secondary | ICD-10-CM | POA: Diagnosis not present

## 2022-05-17 DIAGNOSIS — Z791 Long term (current) use of non-steroidal anti-inflammatories (NSAID): Secondary | ICD-10-CM | POA: Diagnosis not present

## 2022-05-17 DIAGNOSIS — E785 Hyperlipidemia, unspecified: Secondary | ICD-10-CM | POA: Diagnosis not present

## 2022-05-17 DIAGNOSIS — L89220 Pressure ulcer of left hip, unstageable: Secondary | ICD-10-CM | POA: Diagnosis not present

## 2022-05-17 DIAGNOSIS — Z79891 Long term (current) use of opiate analgesic: Secondary | ICD-10-CM | POA: Diagnosis not present

## 2022-05-17 MED ORDER — BD PEN NEEDLE NANO U/F 32G X 4 MM MISC: 1.0000 | Freq: Four times a day (QID) | 2 refills | Status: DC

## 2022-05-17 MED ORDER — BASAGLAR KWIKPEN 100 UNIT/ML ~~LOC~~ SOPN: 20.0000 [IU] | PEN_INJECTOR | Freq: Every day | SUBCUTANEOUS | 1 refills | Status: DC

## 2022-05-17 MED ORDER — GLIPIZIDE 5 MG PO TABS: 5.0000 mg | ORAL_TABLET | Freq: Two times a day (BID) | ORAL | 1 refills | Status: DC

## 2022-05-17 NOTE — Progress Notes (Signed)
05/17/2022, 5:48 PM  Endocrinology follow-up note  Subjective:    Patient ID: Patrick Aguilar, male    DOB: Dec 24, 1950.  Patrick Aguilar is being seen in follow-up after he was seen in consultation for management of currently controlled asymptomatic diabetes requested by  Monico Blitz, MD.   Past Medical History:  Diagnosis Date   AKI (acute kidney injury) ([place]) 12/2017   Angio-edema    Anxiety    Arthritis    Complication of anesthesia    low respirations, low BP   COPD (chronic obstructive pulmonary disease) ([place])    DDD (degenerative disc disease), lumbar    Diabetes mellitus without complication ([place])    type 2   Diastolic dysfunction 21/97/5883   Moderate noted on ECHO   Fournier's gangrene in male    GERD (gastroesophageal reflux disease)    History of ARDS    History of necrotizing fasciitis    Severe   Hypertension    Lumbar spondylosis    Numbness and tingling of both upper extremities    Pulmonary hypertension ([place]) 12/14/2017   Mild, noted on ECHO   Recurrent upper respiratory infection (URI)    Respiratory failure, acute ([place]) 12/2017   Septic shock ([place]) 12/2017   Shortness of breath dyspnea    Testicular pain, left    Wears glasses     Past Surgical History:  Procedure Laterality Date   ADENOIDECTOMY     APPENDECTOMY     APPLICATION OF A-CELL OF CHEST/ABDOMEN N/A 01/08/2018   Procedure: APPLICATION OF A-CELL OF GROIN;  Surgeon: Wallace Going, DO;  Location: [facility];  Service: Plastics;  Laterality: N/A;   APPLICATION OF A-CELL OF EXTREMITY N/A 12/25/2017   Procedure: APPLICATION OF A-CELL;  Surgeon: Wallace Going, DO;  Location: [facility];  Service: Plastics;  Laterality: N/A;   BACK SURGERY     x5   CARDIAC CATHETERIZATION  2006   neg   CERVICAL DISC SURGERY     x2   COLONOSCOPY     DEBRIDEMENT AND CLOSURE WOUND N/A 01/26/2018   Procedure: REVISION OF PERINEUM WOUND WITH DEBRIDEMENT, PARTIAL CLOSURE OF  PERINEUM, PLACEMENT OF [place];  Surgeon: Wallace Going, DO;  Location: [facility];  Service: Plastics;  Laterality: N/A;   DENTAL SURGERY     teeth extractions   groin wound  01/08/2018   : EXCISION OF GROIN WOUND WITH PLACEMENT OF ACELL, AND PRIMARY WOUND CLOSURE (N/A Scrotum)   I & D EXTREMITY N/A 03/12/2018   Procedure: IRRIGATION AND DEBRIDEMENT PERIMUM WOUND WITH CLOSURE;  Surgeon: Wallace Going, DO;  Location: [facility];  Service: Plastics;  Laterality: N/A;   INCISION AND DRAINAGE ABSCESS Left 07/11/2018   Procedure: INCISION AND DRAINAGE ABSCESS LEFT THIGH;  Surgeon: Franchot Gallo, MD;  Location: [facility];  Service: Urology;  Laterality: Left;   INCISION AND DRAINAGE OF WOUND N/A 12/25/2017   Procedure: Irrigation and debridement of Fournier's of scrotum with placement of testes in subcutaneous thigh pockets and Acell placement;  Surgeon: Wallace Going, DO;  Location: [facility];  Service: Plastics;  Laterality: N/A;   INCISION AND DRAINAGE OF WOUND N/A 01/08/2018   Procedure: EXCISION OF GROIN WOUND WITH PLACEMENT OF ACELL, AND PRIMARY WOUND CLOSURE;  Surgeon: Wallace Going, DO;  Location: [facility];  Service: Plastics;  Laterality: N/A;   ORCHIECTOMY N/A 12/13/2017   Procedure: EXCISION OF SCROTUM AND DEBRIDEMENT OF PENIS;  Surgeon: Diona Fanti,  Annie Main, MD;  Location[facility];  Service: Urology;  Laterality: N/A;   ORCHIECTOMY Left 06/22/2018   Procedure: ORCHIECTOMY;  Surgeon: Franchot Gallo, MD;  [facility];  Service: Urology;  Laterality: Left;   PLANTAR FASCIA SURGERY Bilateral    shoulders Bilateral    rotator cuff   SUBMANDIBULAR GLAND EXCISION Left 03/12/2015   Procedure: LEFT SUBMANDIBULAR GLAND RESECTION;  Surgeon: Melida Quitter, MD;  Location: [facility];  Service: ENT;  Laterality: Left;   TONSILLECTOMY     TOTAL HIP ARTHROPLASTY Right 03/06/2019   Procedure: TOTAL HIP ARTHROPLASTY ANTERIOR APPROACH;  Surgeon: Gaynelle Arabian, MD;  Location[facility];  Service: Orthopedics;  Laterality: Right;  11mn    Social History   Socioeconomic History   Marital status: Single    Spouse name: Not on file   Number of children: Not on file   Years of education: Not on file   Highest education level: Not on file  Occupational History   Not on file  Tobacco Use   Smoking status: Former    Packs/day: 1.00    Years: 50.00    Total pack years: 50.00    Types: Cigarettes    Quit date: 12/13/2016    Years since quitting: 5.4   Smokeless tobacco: Never  Vaping Use   Vaping Use: Never used  Substance and Sexual Activity   Alcohol use: No    Comment: quit alcohol 80's   Drug use: No   Sexual activity: Not on file  Other Topics Concern   Not on file  Social History Narrative   Not on file   Social Determinants of Health   Financial Resource Strain: Not on file  Food Insecurity: Not on file  Transportation Needs: Not on file  Physical Activity: Not on file  Stress: Not on file  Social Connections: Not on file    Family History  Problem Relation Age of Onset   Lung cancer Mother    Bladder Cancer Father    Allergic rhinitis Neg Hx    Asthma Neg Hx    Eczema Neg Hx    Urticaria Neg Hx     Outpatient Encounter Medications as of 05/17/2022  Medication Sig   Insulin Glargine (BASAGLAR KWIKPEN) 100 UNIT/ML Inject 20 Units into the skin at bedtime.   Insulin Pen Needle (BD PEN NEEDLE NANO U/F) 32G X 4 MM MISC 1 each by Does not apply route 4 (four) times daily.   albuterol (PROVENTIL) (2.5 MG/3ML) 0.083% nebulizer solution Inhale 2.5 mg into the lungs every 6 (six) hours as needed for shortness of breath or wheezing.   albuterol (VENTOLIN HFA) 108 (90 Base) MCG/ACT inhaler Inhale 2 puffs into the lungs every 6 (six) hours as needed for wheezing or shortness of breath.   aspirin 81 MG EC tablet Take by mouth.   atenolol-chlorthalidone (TENORETIC) 50-25 MG tablet Take 1 tablet by mouth daily with breakfast.     blood glucose meter kit and supplies KIT Dispense based on patient and insurance preference. Use up to four times daily as directed. (FOR ICD-9 250.00, 250.01).   celecoxib (CELEBREX) 200 MG capsule Take 1 capsule by mouth daily.   doxazosin (CARDURA XL) 8 MG 24 hr tablet Take by mouth.   famotidine (PEPCID) 20 MG tablet Take 20 mg by mouth 2 (two) times daily.   furosemide (LASIX) 20 MG tablet Take 20 mg by mouth daily after breakfast.    gabapentin (NEURONTIN) 800 MG tablet Take  800 mg by mouth every 6 (six) hours.    glipiZIDE (GLUCOTROL) 5 MG tablet Take 1 tablet (5 mg total) by mouth 2 (two) times daily.   metFORMIN (GLUCOPHAGE) 500 MG tablet Take 500 mg by mouth once.   oxyCODONE-acetaminophen (PERCOCET) 10-325 MG tablet Take 1 tablet by mouth every 6 (six) hours.   OXYGEN Inhale 1-3 L into the lungs.   pantoprazole (PROTONIX) 40 MG tablet Take 40 mg by mouth daily.   potassium chloride (KLOR-CON) 20 MEQ packet Take by mouth.   rosuvastatin (CRESTOR) 5 MG tablet Take 5 mg by mouth every Monday.   traZODone (DESYREL) 100 MG tablet Take 100 mg by mouth at bedtime.   [DISCONTINUED] glipiZIDE (GLUCOTROL) 5 MG tablet Take 1 tablet (5 mg total) by mouth 2 (two) times daily. (Patient taking differently: Take 5 mg by mouth. Take 2 tablets each morning and 1 tablet in the evening)   No facility-administered encounter medications on file as of 05/17/2022.    ALLERGIES: Allergies  Allergen Reactions   Tramadol Other (See Comments)    tremor   Adenosine     can't tolerate  05/2005   Bupropion     Nausea   Morphine And Related Itching    VACCINATION STATUS: Immunization History  Administered Date(s) Administered   Td,absorbed, Preservative Free, Adult Use, Lf Unspecified 03/05/2007   Tdap 08/10/2017   Zoster Recombinat (Shingrix) 08/10/2017    Diabetes He presents for his follow-up diabetic visit. He has type 2 diabetes mellitus. Onset time: She was diagnosed at approximate age of 59  years. His disease course has been worsening. There are no hypoglycemic associated symptoms. Pertinent negatives for hypoglycemia include no confusion, headaches, pallor or seizures. There are no diabetic associated symptoms. Pertinent negatives for diabetes include no chest pain, no fatigue, no polydipsia, no polyphagia, no polyuria and no weakness. There are no hypoglycemic complications. Symptoms are worsening. Diabetic complications include nephropathy. Risk factors for coronary artery disease include diabetes mellitus, dyslipidemia, hypertension, male sex and sedentary lifestyle. Current diabetic treatments: Is currently on Metformin 500 mg p.o. once  daily, glipizide 5 mg po BID. He is following a generally unhealthy diet. When asked about meal planning, he reported none. He has not had a previous visit with a dietitian. He participates in exercise intermittently. His home blood glucose trend is increasing steadily. His breakfast blood glucose range is generally >200 mg/dl. His lunch blood glucose range is generally >200 mg/dl. His dinner blood glucose range is generally >200 mg/dl. His bedtime blood glucose range is generally >200 mg/dl. His overall blood glucose range is >200 mg/dl. (His presents with his meter showing loss of control in his glycemia.  His 7-day average is 277, 14-day average is 304 mg per DL.  This is sharp increase from his recent A1c of 6%.    He did not document any hypoglycemia recently.    ) An ACE inhibitor/angiotensin II receptor blocker is being taken.  Hyperlipidemia This is a chronic problem. The current episode started more than 1 year ago. Exacerbating diseases include diabetes. Pertinent negatives include no chest pain, myalgias or shortness of breath. Current antihyperlipidemic treatment includes statins. Risk factors for coronary artery disease include diabetes mellitus, dyslipidemia, male sex, hypertension and a sedentary lifestyle.  Hypertension This is a chronic  problem. The current episode started more than 1 year ago. The problem is controlled. Pertinent negatives include no chest pain, headaches, neck pain, palpitations or shortness of breath. Risk factors for coronary artery disease  include diabetes mellitus, dyslipidemia, sedentary lifestyle and smoking/tobacco exposure. Past treatments include beta blockers and diuretics. Hypertensive end-organ damage includes heart failure.     Review of Systems  Constitutional:  Negative for chills, fatigue, fever and unexpected weight change.  HENT:  Negative for dental problem, mouth sores and trouble swallowing.   Eyes:  Negative for visual disturbance.  Respiratory:  Negative for cough, choking, chest tightness, shortness of breath and wheezing.   Cardiovascular:  Negative for chest pain, palpitations and leg swelling.  Gastrointestinal:  Negative for abdominal distention, abdominal pain, constipation, diarrhea, nausea and vomiting.  Endocrine: Negative for polydipsia, polyphagia and polyuria.  Genitourinary:  Negative for dysuria, flank pain, hematuria and urgency.  Musculoskeletal:  Positive for gait problem. Negative for back pain, myalgias and neck pain.       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   Skin:  Negative for pallor, rash and wound.  Neurological:  Negative for seizures, syncope, weakness, numbness and headaches.  Psychiatric/Behavioral:  Negative for confusion and dysphoric mood.     Objective:       05/17/2022    2:17 PM 04/11/2022    2:36 PM 10/12/2021    3:11 PM  Vitals with BMI  Height _0   _1   Weight   183 lbs  BMI   56.38  Systolic 756 433 295  Diastolic 60 64 64  Pulse 72 80 76    BP 102/60   Pulse 72   Ht _2  (1.676 m)   BMI 29.54 kg/m   Wt Readings from Last 3 Encounters:  10/12/21 183 lb (83 kg)  08/16/21 183 lb 3.2 oz (83.1 kg)  07/13/21 176 lb (79.8 kg)       CMP ( most recent) CMP     Component Value Date/Time   NA 140  04/06/2022 1129   K 3.7 04/06/2022 1129   CL 99 04/06/2022 1129   CO2 25 04/06/2022 1129   GLUCOSE 63 (L) 04/06/2022 1129   GLUCOSE 212 (H) 02/12/2021 1109   BUN 25 04/06/2022 1129   CREATININE 1.03 04/06/2022 1129   CREATININE 1.13 05/13/2020 0000   CALCIUM 9.6 04/06/2022 1129   PROT 6.6 04/06/2022 1129   ALBUMIN 3.9 04/06/2022 1129   AST 12 04/06/2022 1129   ALT 13 04/06/2022 1129   ALKPHOS 80 04/06/2022 1129   BILITOT 0.3 04/06/2022 1129   GFRNONAA >60 02/12/2021 1109   GFRNONAA 66 05/13/2020 0000   GFRAA 76 05/13/2020 0000     Diabetic Labs (most recent): Lab Results  Component Value Date   HGBA1C 6.0 04/11/2022   HGBA1C 5.9 08/04/2021   HGBA1C 6.6 05/03/2021   MICROALBUR <0.2 05/13/2020     Lipid Panel ( most recent) Lipid Panel     Component Value Date/Time   CHOL 130 04/06/2022 1129   TRIG 120 04/06/2022 1129   HDL 46 04/06/2022 1129   CHOLHDL 2.8 04/06/2022 1129   LDLCALC 62 04/06/2022 1129   LABVLDL 22 04/06/2022 1129     Assessment & Plan:   1. Type 2 diabetes mellitus without complication, without long-term current use of insulin (HCC)  - Patrick Aguilar has currently controlled asymptomatic type 2 DM since  71 years of age. His presents with his meter showing loss of control in his glycemia.  His 7-day average is 277, 14-day average is 304 mg per DL.  This is sharp increase from his recent A1c of 6%.  He did not  document any hypoglycemia recently.    - I had a long discussion with him about the progressive nature of diabetes and the pathology behind its complications. -his diabetes is complicated by  hx of  heavy smoking, deconditioning/sedentary life, diastolic dysfunction and he remains at a high risk for more acute and chronic complications which include CAD, CVA, CKD, retinopathy, and neuropathy. These are all discussed in detail with him.  - I have counseled him on diet  and weight management  by adopting a carbohydrate restricted/protein rich  diet. Patient is encouraged to switch to  unprocessed or minimally processed     complex starch and increased protein intake (animal or plant source), fruits, and vegetables. -  he is advised to stick to a routine mealtimes to eat 3 meals  a day and avoid unnecessary snacks ( to snack only to correct hypoglycemia).   - he acknowledges that there is a room for improvement in his food and drink choices. - Suggestion is made for him to avoid simple carbohydrates  from his diet including Cakes, Sweet Desserts, Ice Cream, Soda (diet and regular), Sweet Tea, Candies, Chips, Cookies, Store Bought Juices, Alcohol in Excess of  1-2 drinks a day, Artificial Sweeteners,  Coffee Creamer, and "Sugar-free" Products, Lemonade. This will help patient to have more stable blood glucose profile and potentially avoid unintended weight gain.  - he will be scheduled with Jearld Fenton, RDN, CDE for diabetes education.  - I have approached him with the following individualized plan to manage  his diabetes and patient agrees:   -In light of his presentation with loss of control with severe hyperglycemia, he will need insulin treatment.  I discussed and prescribed Basaglar 20 units nightly associated with monitoring of blood glucose 4 times a day-before meals and at bedtime.  I used a sample of pain to demonstrate insulin use technique in the exam room.    He is advised to continue metformin 500 mg p.o. daily at breakfast, lower glipizide to 5 mg p.o. twice daily.     He is not a candidate for SGLT2 inhibitors due to his prior history of Fournier's gangrene.  - Specific targets for  A1c;  LDL, HDL,  and Triglycerides were discussed with the patient.  2) Blood Pressure /Hypertension:  -His blood pressure is controlled to target.  He is advised to discontinue amlodipine.  He is advised to continue  atenolol/HCTZ mg p.o. daily with breakfast .  3) Lipids/Hyperlipidemia:   Review of his recent lipid panel showed improving  hypertriglyceridemia at 177 improving from 259.  His LDL is improving to 62.  He is advised to continue Crestor 5 mg p.o. nightly.     Side effects and precautions discussed with him.  4)  Weight/Diet:  Body mass index is 29.54 kg/m.  -He would benefit from some weight loss.   I discussed with him the fact that loss of 5 - 10% of his  current body weight will have the most impact on his diabetes management.  Exercise, and detailed carbohydrates information provided  -  detailed on discharge instructions.  5) Chronic Care/Health Maintenance:  -he  is on Statin medications and  is encouraged to initiate and continue to follow up with Ophthalmology, Dentist,  Podiatrist at least yearly or according to recommendations, and advised to   stay away from smoking. I have recommended yearly flu vaccine and pneumonia vaccine at least every 5 years; moderate intensity exercise for up to 150 minutes weekly; and  sleep for at least 7 hours a day.  - he is  advised to maintain close follow up with Monico Blitz, MD for primary care needs, as well as his other providers for optimal and coordinated care.  I spent 35 minutes in the care of the patient today including review of labs from [facility], Lipids, Thyroid Function, Hematology (current and previous including abstractions from other facilities); face-to-face time discussing  his blood glucose readings/logs, discussing hypoglycemia and hyperglycemia episodes and symptoms, medications doses, his options of short and long term treatment based on the latest standards of care / guidelines;  discussion about incorporating lifestyle medicine;  and documenting the encounter. Risk reduction counseling performed per USPSTF guidelines to reduce  and cardiovascular risk factors.     Please refer to Patient Instructions for Blood Glucose Monitoring and Insulin/Medications Dosing Guide"  in media tab for additional information. Please  also refer to " Patient Self Inventory" in the  Media  tab for reviewed elements of pertinent patient history.  Cherylynn Ridges participated in the discussions, expressed understanding, and voiced agreement with the above plans.  All questions were answered to his satisfaction. he is encouraged to contact clinic should he have any questions or concerns prior to his return visit.    Follow up plan: - Return in about 2 weeks (around 05/31/2022) for F/U with Meter/CGM Edison Simon Only - no Labs.  Glade Lloyd, MD [facility] [address] [place], [place] [address] Phone: [phone]  Fax: [phone]    05/17/2022, 5:48 PM  This note was partially dictated with voice recognition software. Similar sounding words can be transcribed inadequately or may not  be corrected upon review.

## 2022-05-17 NOTE — Patient Instructions (Signed)

## 2022-05-17 NOTE — Telephone Encounter (Signed)
Called pt and made him aware that he no showed the appt on 8/31 and we needed to see his readings checking four times a day.   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 Sometimes he has someone there but not all the time. Please Advise

## 2022-05-18 ENCOUNTER — Telehealth: Payer: Self-pay | Admitting: "Endocrinology

## 2022-05-18 DIAGNOSIS — Z791 Long term (current) use of non-steroidal anti-inflammatories (NSAID): Secondary | ICD-10-CM | POA: Diagnosis not present

## 2022-05-18 DIAGNOSIS — K219 Gastro-esophageal reflux disease without esophagitis: Secondary | ICD-10-CM | POA: Diagnosis not present

## 2022-05-18 DIAGNOSIS — E1140 Type 2 diabetes mellitus with diabetic neuropathy, unspecified: Secondary | ICD-10-CM | POA: Diagnosis not present

## 2022-05-18 DIAGNOSIS — Z4803 Encounter for change or removal of drains: Secondary | ICD-10-CM | POA: Diagnosis not present

## 2022-05-18 DIAGNOSIS — Z993 Dependence on wheelchair: Secondary | ICD-10-CM | POA: Diagnosis not present

## 2022-05-18 DIAGNOSIS — E785 Hyperlipidemia, unspecified: Secondary | ICD-10-CM | POA: Diagnosis not present

## 2022-05-18 DIAGNOSIS — Z7984 Long term (current) use of oral hypoglycemic drugs: Secondary | ICD-10-CM | POA: Diagnosis not present

## 2022-05-18 DIAGNOSIS — E876 Hypokalemia: Secondary | ICD-10-CM | POA: Diagnosis not present

## 2022-05-18 DIAGNOSIS — G8929 Other chronic pain: Secondary | ICD-10-CM | POA: Diagnosis not present

## 2022-05-18 DIAGNOSIS — Z79891 Long term (current) use of opiate analgesic: Secondary | ICD-10-CM | POA: Diagnosis not present

## 2022-05-18 DIAGNOSIS — Z7982 Long term (current) use of aspirin: Secondary | ICD-10-CM | POA: Diagnosis not present

## 2022-05-18 DIAGNOSIS — L89220 Pressure ulcer of left hip, unstageable: Secondary | ICD-10-CM | POA: Diagnosis not present

## 2022-05-18 NOTE — Telephone Encounter (Signed)
Pt called with high BG readings. He was in the office yesterday and did take 20 units last night as well as glipizide and metformin this morning   Date Before breakfast Before lunch Before supper Bedtime  9/5   278 303  9/6 218 214                  Pt taking:  Basaglar 20 units qhs       Glipizide 5mg  bid        Metformin 500mg  qd

## 2022-05-18 NOTE — Telephone Encounter (Signed)
Pt.notified

## 2022-05-19 DIAGNOSIS — K219 Gastro-esophageal reflux disease without esophagitis: Secondary | ICD-10-CM | POA: Diagnosis not present

## 2022-05-19 DIAGNOSIS — E785 Hyperlipidemia, unspecified: Secondary | ICD-10-CM | POA: Diagnosis not present

## 2022-05-19 DIAGNOSIS — Z4803 Encounter for change or removal of drains: Secondary | ICD-10-CM | POA: Diagnosis not present

## 2022-05-19 DIAGNOSIS — L89220 Pressure ulcer of left hip, unstageable: Secondary | ICD-10-CM | POA: Diagnosis not present

## 2022-05-19 DIAGNOSIS — G8929 Other chronic pain: Secondary | ICD-10-CM | POA: Diagnosis not present

## 2022-05-19 DIAGNOSIS — Z7984 Long term (current) use of oral hypoglycemic drugs: Secondary | ICD-10-CM | POA: Diagnosis not present

## 2022-05-19 DIAGNOSIS — M4716 Other spondylosis with myelopathy, lumbar region: Secondary | ICD-10-CM | POA: Diagnosis not present

## 2022-05-19 DIAGNOSIS — Z7982 Long term (current) use of aspirin: Secondary | ICD-10-CM | POA: Diagnosis not present

## 2022-05-19 DIAGNOSIS — Z993 Dependence on wheelchair: Secondary | ICD-10-CM | POA: Diagnosis not present

## 2022-05-19 DIAGNOSIS — E876 Hypokalemia: Secondary | ICD-10-CM | POA: Diagnosis not present

## 2022-05-19 DIAGNOSIS — J449 Chronic obstructive pulmonary disease, unspecified: Secondary | ICD-10-CM | POA: Diagnosis not present

## 2022-05-19 DIAGNOSIS — E1140 Type 2 diabetes mellitus with diabetic neuropathy, unspecified: Secondary | ICD-10-CM | POA: Diagnosis not present

## 2022-05-19 DIAGNOSIS — Z79891 Long term (current) use of opiate analgesic: Secondary | ICD-10-CM | POA: Diagnosis not present

## 2022-05-19 DIAGNOSIS — Z791 Long term (current) use of non-steroidal anti-inflammatories (NSAID): Secondary | ICD-10-CM | POA: Diagnosis not present

## 2022-05-20 DIAGNOSIS — Z23 Encounter for immunization: Secondary | ICD-10-CM | POA: Diagnosis not present

## 2022-05-20 DIAGNOSIS — I10 Essential (primary) hypertension: Secondary | ICD-10-CM | POA: Diagnosis not present

## 2022-05-20 DIAGNOSIS — J449 Chronic obstructive pulmonary disease, unspecified: Secondary | ICD-10-CM | POA: Diagnosis not present

## 2022-05-20 DIAGNOSIS — Z299 Encounter for prophylactic measures, unspecified: Secondary | ICD-10-CM | POA: Diagnosis not present

## 2022-05-20 DIAGNOSIS — I700 Atherosclerosis of aorta: Secondary | ICD-10-CM | POA: Diagnosis not present

## 2022-05-23 DIAGNOSIS — L89220 Pressure ulcer of left hip, unstageable: Secondary | ICD-10-CM | POA: Diagnosis not present

## 2022-05-23 DIAGNOSIS — Z993 Dependence on wheelchair: Secondary | ICD-10-CM | POA: Diagnosis not present

## 2022-05-23 DIAGNOSIS — E876 Hypokalemia: Secondary | ICD-10-CM | POA: Diagnosis not present

## 2022-05-23 DIAGNOSIS — E785 Hyperlipidemia, unspecified: Secondary | ICD-10-CM | POA: Diagnosis not present

## 2022-05-23 DIAGNOSIS — Z7982 Long term (current) use of aspirin: Secondary | ICD-10-CM | POA: Diagnosis not present

## 2022-05-23 DIAGNOSIS — G8929 Other chronic pain: Secondary | ICD-10-CM | POA: Diagnosis not present

## 2022-05-23 DIAGNOSIS — Z79891 Long term (current) use of opiate analgesic: Secondary | ICD-10-CM | POA: Diagnosis not present

## 2022-05-23 DIAGNOSIS — K219 Gastro-esophageal reflux disease without esophagitis: Secondary | ICD-10-CM | POA: Diagnosis not present

## 2022-05-23 DIAGNOSIS — Z791 Long term (current) use of non-steroidal anti-inflammatories (NSAID): Secondary | ICD-10-CM | POA: Diagnosis not present

## 2022-05-23 DIAGNOSIS — Z7984 Long term (current) use of oral hypoglycemic drugs: Secondary | ICD-10-CM | POA: Diagnosis not present

## 2022-05-23 DIAGNOSIS — E1140 Type 2 diabetes mellitus with diabetic neuropathy, unspecified: Secondary | ICD-10-CM | POA: Diagnosis not present

## 2022-05-23 DIAGNOSIS — Z4803 Encounter for change or removal of drains: Secondary | ICD-10-CM | POA: Diagnosis not present

## 2022-05-25 DIAGNOSIS — L89224 Pressure ulcer of left hip, stage 4: Secondary | ICD-10-CM | POA: Diagnosis not present

## 2022-05-26 DIAGNOSIS — Z4803 Encounter for change or removal of drains: Secondary | ICD-10-CM | POA: Diagnosis not present

## 2022-05-26 DIAGNOSIS — Z993 Dependence on wheelchair: Secondary | ICD-10-CM | POA: Diagnosis not present

## 2022-05-26 DIAGNOSIS — L89220 Pressure ulcer of left hip, unstageable: Secondary | ICD-10-CM | POA: Diagnosis not present

## 2022-05-26 DIAGNOSIS — G8929 Other chronic pain: Secondary | ICD-10-CM | POA: Diagnosis not present

## 2022-05-26 DIAGNOSIS — Z79891 Long term (current) use of opiate analgesic: Secondary | ICD-10-CM | POA: Diagnosis not present

## 2022-05-26 DIAGNOSIS — Z7982 Long term (current) use of aspirin: Secondary | ICD-10-CM | POA: Diagnosis not present

## 2022-05-26 DIAGNOSIS — E1140 Type 2 diabetes mellitus with diabetic neuropathy, unspecified: Secondary | ICD-10-CM | POA: Diagnosis not present

## 2022-05-26 DIAGNOSIS — E785 Hyperlipidemia, unspecified: Secondary | ICD-10-CM | POA: Diagnosis not present

## 2022-05-26 DIAGNOSIS — K219 Gastro-esophageal reflux disease without esophagitis: Secondary | ICD-10-CM | POA: Diagnosis not present

## 2022-05-26 DIAGNOSIS — E876 Hypokalemia: Secondary | ICD-10-CM | POA: Diagnosis not present

## 2022-05-26 DIAGNOSIS — Z7984 Long term (current) use of oral hypoglycemic drugs: Secondary | ICD-10-CM | POA: Diagnosis not present

## 2022-05-26 DIAGNOSIS — Z791 Long term (current) use of non-steroidal anti-inflammatories (NSAID): Secondary | ICD-10-CM | POA: Diagnosis not present

## 2022-05-30 DIAGNOSIS — G8929 Other chronic pain: Secondary | ICD-10-CM | POA: Diagnosis not present

## 2022-05-30 DIAGNOSIS — K219 Gastro-esophageal reflux disease without esophagitis: Secondary | ICD-10-CM | POA: Diagnosis not present

## 2022-05-30 DIAGNOSIS — L89220 Pressure ulcer of left hip, unstageable: Secondary | ICD-10-CM | POA: Diagnosis not present

## 2022-05-30 DIAGNOSIS — E876 Hypokalemia: Secondary | ICD-10-CM | POA: Diagnosis not present

## 2022-05-30 DIAGNOSIS — Z7984 Long term (current) use of oral hypoglycemic drugs: Secondary | ICD-10-CM | POA: Diagnosis not present

## 2022-05-30 DIAGNOSIS — E785 Hyperlipidemia, unspecified: Secondary | ICD-10-CM | POA: Diagnosis not present

## 2022-05-30 DIAGNOSIS — E1140 Type 2 diabetes mellitus with diabetic neuropathy, unspecified: Secondary | ICD-10-CM | POA: Diagnosis not present

## 2022-05-30 DIAGNOSIS — Z7982 Long term (current) use of aspirin: Secondary | ICD-10-CM | POA: Diagnosis not present

## 2022-05-30 DIAGNOSIS — Z993 Dependence on wheelchair: Secondary | ICD-10-CM | POA: Diagnosis not present

## 2022-05-30 DIAGNOSIS — Z79891 Long term (current) use of opiate analgesic: Secondary | ICD-10-CM | POA: Diagnosis not present

## 2022-05-30 DIAGNOSIS — Z4803 Encounter for change or removal of drains: Secondary | ICD-10-CM | POA: Diagnosis not present

## 2022-05-30 DIAGNOSIS — Z791 Long term (current) use of non-steroidal anti-inflammatories (NSAID): Secondary | ICD-10-CM | POA: Diagnosis not present

## 2022-05-31 DIAGNOSIS — G9529 Other cord compression: Secondary | ICD-10-CM | POA: Diagnosis not present

## 2022-05-31 DIAGNOSIS — K2100 Gastro-esophageal reflux disease with esophagitis, without bleeding: Secondary | ICD-10-CM | POA: Diagnosis not present

## 2022-05-31 DIAGNOSIS — G319 Degenerative disease of nervous system, unspecified: Secondary | ICD-10-CM | POA: Diagnosis not present

## 2022-05-31 DIAGNOSIS — M459 Ankylosing spondylitis of unspecified sites in spine: Secondary | ICD-10-CM | POA: Diagnosis not present

## 2022-05-31 DIAGNOSIS — J449 Chronic obstructive pulmonary disease, unspecified: Secondary | ICD-10-CM | POA: Diagnosis not present

## 2022-05-31 DIAGNOSIS — E1142 Type 2 diabetes mellitus with diabetic polyneuropathy: Secondary | ICD-10-CM | POA: Diagnosis not present

## 2022-05-31 DIAGNOSIS — I129 Hypertensive chronic kidney disease with stage 1 through stage 4 chronic kidney disease, or unspecified chronic kidney disease: Secondary | ICD-10-CM | POA: Diagnosis not present

## 2022-05-31 DIAGNOSIS — E876 Hypokalemia: Secondary | ICD-10-CM | POA: Diagnosis not present

## 2022-05-31 DIAGNOSIS — N182 Chronic kidney disease, stage 2 (mild): Secondary | ICD-10-CM | POA: Diagnosis not present

## 2022-05-31 DIAGNOSIS — G8220 Paraplegia, unspecified: Secondary | ICD-10-CM | POA: Diagnosis not present

## 2022-05-31 DIAGNOSIS — Z7982 Long term (current) use of aspirin: Secondary | ICD-10-CM | POA: Diagnosis not present

## 2022-05-31 DIAGNOSIS — M4800 Spinal stenosis, site unspecified: Secondary | ICD-10-CM | POA: Diagnosis not present

## 2022-05-31 DIAGNOSIS — G8929 Other chronic pain: Secondary | ICD-10-CM | POA: Diagnosis not present

## 2022-05-31 DIAGNOSIS — N179 Acute kidney failure, unspecified: Secondary | ICD-10-CM | POA: Diagnosis not present

## 2022-05-31 DIAGNOSIS — E1122 Type 2 diabetes mellitus with diabetic chronic kidney disease: Secondary | ICD-10-CM | POA: Diagnosis not present

## 2022-05-31 DIAGNOSIS — M1990 Unspecified osteoarthritis, unspecified site: Secondary | ICD-10-CM | POA: Diagnosis not present

## 2022-05-31 DIAGNOSIS — M479 Spondylosis, unspecified: Secondary | ICD-10-CM | POA: Diagnosis not present

## 2022-05-31 DIAGNOSIS — L89224 Pressure ulcer of left hip, stage 4: Secondary | ICD-10-CM | POA: Diagnosis not present

## 2022-05-31 DIAGNOSIS — Z7984 Long term (current) use of oral hypoglycemic drugs: Secondary | ICD-10-CM | POA: Diagnosis not present

## 2022-05-31 DIAGNOSIS — E785 Hyperlipidemia, unspecified: Secondary | ICD-10-CM | POA: Diagnosis not present

## 2022-05-31 DIAGNOSIS — I700 Atherosclerosis of aorta: Secondary | ICD-10-CM | POA: Diagnosis not present

## 2022-06-03 DIAGNOSIS — I700 Atherosclerosis of aorta: Secondary | ICD-10-CM | POA: Diagnosis not present

## 2022-06-03 DIAGNOSIS — G8929 Other chronic pain: Secondary | ICD-10-CM | POA: Diagnosis not present

## 2022-06-03 DIAGNOSIS — G319 Degenerative disease of nervous system, unspecified: Secondary | ICD-10-CM | POA: Diagnosis not present

## 2022-06-03 DIAGNOSIS — E1142 Type 2 diabetes mellitus with diabetic polyneuropathy: Secondary | ICD-10-CM | POA: Diagnosis not present

## 2022-06-03 DIAGNOSIS — M479 Spondylosis, unspecified: Secondary | ICD-10-CM | POA: Diagnosis not present

## 2022-06-03 DIAGNOSIS — G8220 Paraplegia, unspecified: Secondary | ICD-10-CM | POA: Diagnosis not present

## 2022-06-03 DIAGNOSIS — L89224 Pressure ulcer of left hip, stage 4: Secondary | ICD-10-CM | POA: Diagnosis not present

## 2022-06-03 DIAGNOSIS — E785 Hyperlipidemia, unspecified: Secondary | ICD-10-CM | POA: Diagnosis not present

## 2022-06-03 DIAGNOSIS — G9529 Other cord compression: Secondary | ICD-10-CM | POA: Diagnosis not present

## 2022-06-03 DIAGNOSIS — K2100 Gastro-esophageal reflux disease with esophagitis, without bleeding: Secondary | ICD-10-CM | POA: Diagnosis not present

## 2022-06-03 DIAGNOSIS — N179 Acute kidney failure, unspecified: Secondary | ICD-10-CM | POA: Diagnosis not present

## 2022-06-03 DIAGNOSIS — M459 Ankylosing spondylitis of unspecified sites in spine: Secondary | ICD-10-CM | POA: Diagnosis not present

## 2022-06-03 DIAGNOSIS — I129 Hypertensive chronic kidney disease with stage 1 through stage 4 chronic kidney disease, or unspecified chronic kidney disease: Secondary | ICD-10-CM | POA: Diagnosis not present

## 2022-06-03 DIAGNOSIS — E1122 Type 2 diabetes mellitus with diabetic chronic kidney disease: Secondary | ICD-10-CM | POA: Diagnosis not present

## 2022-06-03 DIAGNOSIS — M4800 Spinal stenosis, site unspecified: Secondary | ICD-10-CM | POA: Diagnosis not present

## 2022-06-03 DIAGNOSIS — J449 Chronic obstructive pulmonary disease, unspecified: Secondary | ICD-10-CM | POA: Diagnosis not present

## 2022-06-03 DIAGNOSIS — Z7984 Long term (current) use of oral hypoglycemic drugs: Secondary | ICD-10-CM | POA: Diagnosis not present

## 2022-06-03 DIAGNOSIS — M1990 Unspecified osteoarthritis, unspecified site: Secondary | ICD-10-CM | POA: Diagnosis not present

## 2022-06-03 DIAGNOSIS — Z7982 Long term (current) use of aspirin: Secondary | ICD-10-CM | POA: Diagnosis not present

## 2022-06-03 DIAGNOSIS — E876 Hypokalemia: Secondary | ICD-10-CM | POA: Diagnosis not present

## 2022-06-03 DIAGNOSIS — N182 Chronic kidney disease, stage 2 (mild): Secondary | ICD-10-CM | POA: Diagnosis not present

## 2022-06-06 ENCOUNTER — Ambulatory Visit: Payer: Medicare Other | Admitting: "Endocrinology

## 2022-06-06 ENCOUNTER — Telehealth: Payer: Self-pay | Admitting: "Endocrinology

## 2022-06-06 DIAGNOSIS — E119 Type 2 diabetes mellitus without complications: Secondary | ICD-10-CM

## 2022-06-06 MED ORDER — BASAGLAR KWIKPEN 100 UNIT/ML ~~LOC~~ SOPN: 30.0000 [IU] | PEN_INJECTOR | Freq: Every day | SUBCUTANEOUS | 0 refills | Status: DC

## 2022-06-06 MED ORDER — BD PEN NEEDLE NANO U/F 32G X 4 MM MISC: 1.0000 | Freq: Four times a day (QID) | 2 refills | Status: DC

## 2022-06-06 NOTE — Telephone Encounter (Signed)
New message    1. Which medications need to be refilled? (please list name of each medication and dose if known)   Insulin Glargine (BASAGLAR KWIKPEN) 100 UNIT/ML  Insulin Pen Needle (BD PEN NEEDLE NANO U/F) 32G X 4 MM MISC  2. Which pharmacy/location (including street and city if local pharmacy) is medication to be sent to? [place] [place]   3. Do they need a 30 day or 90 day supply? 30 day supply

## 2022-06-06 NOTE — Telephone Encounter (Signed)
Rx refill(s) sent.

## 2022-06-07 DIAGNOSIS — G8929 Other chronic pain: Secondary | ICD-10-CM | POA: Diagnosis not present

## 2022-06-07 DIAGNOSIS — E785 Hyperlipidemia, unspecified: Secondary | ICD-10-CM | POA: Diagnosis not present

## 2022-06-07 DIAGNOSIS — G9529 Other cord compression: Secondary | ICD-10-CM | POA: Diagnosis not present

## 2022-06-07 DIAGNOSIS — Z7984 Long term (current) use of oral hypoglycemic drugs: Secondary | ICD-10-CM | POA: Diagnosis not present

## 2022-06-07 DIAGNOSIS — N179 Acute kidney failure, unspecified: Secondary | ICD-10-CM | POA: Diagnosis not present

## 2022-06-07 DIAGNOSIS — E876 Hypokalemia: Secondary | ICD-10-CM | POA: Diagnosis not present

## 2022-06-07 DIAGNOSIS — E119 Type 2 diabetes mellitus without complications: Secondary | ICD-10-CM | POA: Diagnosis not present

## 2022-06-07 DIAGNOSIS — G8220 Paraplegia, unspecified: Secondary | ICD-10-CM | POA: Diagnosis not present

## 2022-06-07 DIAGNOSIS — M459 Ankylosing spondylitis of unspecified sites in spine: Secondary | ICD-10-CM | POA: Diagnosis not present

## 2022-06-07 DIAGNOSIS — I129 Hypertensive chronic kidney disease with stage 1 through stage 4 chronic kidney disease, or unspecified chronic kidney disease: Secondary | ICD-10-CM | POA: Diagnosis not present

## 2022-06-07 DIAGNOSIS — K2100 Gastro-esophageal reflux disease with esophagitis, without bleeding: Secondary | ICD-10-CM | POA: Diagnosis not present

## 2022-06-07 DIAGNOSIS — E1142 Type 2 diabetes mellitus with diabetic polyneuropathy: Secondary | ICD-10-CM | POA: Diagnosis not present

## 2022-06-07 DIAGNOSIS — M1990 Unspecified osteoarthritis, unspecified site: Secondary | ICD-10-CM | POA: Diagnosis not present

## 2022-06-07 DIAGNOSIS — M479 Spondylosis, unspecified: Secondary | ICD-10-CM | POA: Diagnosis not present

## 2022-06-07 DIAGNOSIS — G319 Degenerative disease of nervous system, unspecified: Secondary | ICD-10-CM | POA: Diagnosis not present

## 2022-06-07 DIAGNOSIS — I700 Atherosclerosis of aorta: Secondary | ICD-10-CM | POA: Diagnosis not present

## 2022-06-07 DIAGNOSIS — M4800 Spinal stenosis, site unspecified: Secondary | ICD-10-CM | POA: Diagnosis not present

## 2022-06-07 DIAGNOSIS — N182 Chronic kidney disease, stage 2 (mild): Secondary | ICD-10-CM | POA: Diagnosis not present

## 2022-06-07 DIAGNOSIS — L89224 Pressure ulcer of left hip, stage 4: Secondary | ICD-10-CM | POA: Diagnosis not present

## 2022-06-07 DIAGNOSIS — Z7982 Long term (current) use of aspirin: Secondary | ICD-10-CM | POA: Diagnosis not present

## 2022-06-07 DIAGNOSIS — E1122 Type 2 diabetes mellitus with diabetic chronic kidney disease: Secondary | ICD-10-CM | POA: Diagnosis not present

## 2022-06-07 DIAGNOSIS — J449 Chronic obstructive pulmonary disease, unspecified: Secondary | ICD-10-CM | POA: Diagnosis not present

## 2022-06-09 DIAGNOSIS — N179 Acute kidney failure, unspecified: Secondary | ICD-10-CM | POA: Diagnosis not present

## 2022-06-09 DIAGNOSIS — J449 Chronic obstructive pulmonary disease, unspecified: Secondary | ICD-10-CM | POA: Diagnosis not present

## 2022-06-09 DIAGNOSIS — G319 Degenerative disease of nervous system, unspecified: Secondary | ICD-10-CM | POA: Diagnosis not present

## 2022-06-09 DIAGNOSIS — I129 Hypertensive chronic kidney disease with stage 1 through stage 4 chronic kidney disease, or unspecified chronic kidney disease: Secondary | ICD-10-CM | POA: Diagnosis not present

## 2022-06-09 DIAGNOSIS — N182 Chronic kidney disease, stage 2 (mild): Secondary | ICD-10-CM | POA: Diagnosis not present

## 2022-06-09 DIAGNOSIS — E785 Hyperlipidemia, unspecified: Secondary | ICD-10-CM | POA: Diagnosis not present

## 2022-06-09 DIAGNOSIS — M1990 Unspecified osteoarthritis, unspecified site: Secondary | ICD-10-CM | POA: Diagnosis not present

## 2022-06-09 DIAGNOSIS — Z7982 Long term (current) use of aspirin: Secondary | ICD-10-CM | POA: Diagnosis not present

## 2022-06-09 DIAGNOSIS — K2100 Gastro-esophageal reflux disease with esophagitis, without bleeding: Secondary | ICD-10-CM | POA: Diagnosis not present

## 2022-06-09 DIAGNOSIS — M4800 Spinal stenosis, site unspecified: Secondary | ICD-10-CM | POA: Diagnosis not present

## 2022-06-09 DIAGNOSIS — M479 Spondylosis, unspecified: Secondary | ICD-10-CM | POA: Diagnosis not present

## 2022-06-09 DIAGNOSIS — Z7984 Long term (current) use of oral hypoglycemic drugs: Secondary | ICD-10-CM | POA: Diagnosis not present

## 2022-06-09 DIAGNOSIS — L89224 Pressure ulcer of left hip, stage 4: Secondary | ICD-10-CM | POA: Diagnosis not present

## 2022-06-09 DIAGNOSIS — M459 Ankylosing spondylitis of unspecified sites in spine: Secondary | ICD-10-CM | POA: Diagnosis not present

## 2022-06-09 DIAGNOSIS — E1122 Type 2 diabetes mellitus with diabetic chronic kidney disease: Secondary | ICD-10-CM | POA: Diagnosis not present

## 2022-06-09 DIAGNOSIS — G9529 Other cord compression: Secondary | ICD-10-CM | POA: Diagnosis not present

## 2022-06-09 DIAGNOSIS — G8929 Other chronic pain: Secondary | ICD-10-CM | POA: Diagnosis not present

## 2022-06-09 DIAGNOSIS — E876 Hypokalemia: Secondary | ICD-10-CM | POA: Diagnosis not present

## 2022-06-09 DIAGNOSIS — I700 Atherosclerosis of aorta: Secondary | ICD-10-CM | POA: Diagnosis not present

## 2022-06-09 DIAGNOSIS — G8220 Paraplegia, unspecified: Secondary | ICD-10-CM | POA: Diagnosis not present

## 2022-06-09 DIAGNOSIS — E1142 Type 2 diabetes mellitus with diabetic polyneuropathy: Secondary | ICD-10-CM | POA: Diagnosis not present

## 2022-06-09 MED ORDER — BASAGLAR KWIKPEN 100 UNIT/ML ~~LOC~~ SOPN: 30.0000 [IU] | PEN_INJECTOR | Freq: Every day | SUBCUTANEOUS | 2 refills | Status: DC

## 2022-06-09 MED ORDER — BD PEN NEEDLE NANO U/F 32G X 4 MM MISC: 1.0000 | Freq: Four times a day (QID) | 2 refills | Status: DC

## 2022-06-09 NOTE — Addendum Note (Signed)
Addended by: Lavell Luster A on: 06/09/2022 09:57 AM   Modules accepted: Orders

## 2022-06-09 NOTE — Telephone Encounter (Signed)
Please Advise. It looks like the RX did not go through and patient is calling back because he is out of his insulin.

## 2022-06-09 NOTE — Telephone Encounter (Signed)
Resent

## 2022-06-10 DIAGNOSIS — M4716 Other spondylosis with myelopathy, lumbar region: Secondary | ICD-10-CM | POA: Diagnosis not present

## 2022-06-10 DIAGNOSIS — J449 Chronic obstructive pulmonary disease, unspecified: Secondary | ICD-10-CM | POA: Diagnosis not present

## 2022-06-11 DIAGNOSIS — E7800 Pure hypercholesterolemia, unspecified: Secondary | ICD-10-CM | POA: Diagnosis not present

## 2022-06-11 DIAGNOSIS — E1165 Type 2 diabetes mellitus with hyperglycemia: Secondary | ICD-10-CM | POA: Diagnosis not present

## 2022-06-11 DIAGNOSIS — I10 Essential (primary) hypertension: Secondary | ICD-10-CM | POA: Diagnosis not present

## 2022-06-11 DIAGNOSIS — K219 Gastro-esophageal reflux disease without esophagitis: Secondary | ICD-10-CM | POA: Diagnosis not present

## 2022-06-12 DIAGNOSIS — L89224 Pressure ulcer of left hip, stage 4: Secondary | ICD-10-CM | POA: Diagnosis not present

## 2022-06-13 ENCOUNTER — Telehealth: Payer: Self-pay | Admitting: "Endocrinology

## 2022-06-13 DIAGNOSIS — N179 Acute kidney failure, unspecified: Secondary | ICD-10-CM | POA: Diagnosis not present

## 2022-06-13 DIAGNOSIS — J449 Chronic obstructive pulmonary disease, unspecified: Secondary | ICD-10-CM | POA: Diagnosis not present

## 2022-06-13 DIAGNOSIS — Z7984 Long term (current) use of oral hypoglycemic drugs: Secondary | ICD-10-CM | POA: Diagnosis not present

## 2022-06-13 DIAGNOSIS — I129 Hypertensive chronic kidney disease with stage 1 through stage 4 chronic kidney disease, or unspecified chronic kidney disease: Secondary | ICD-10-CM | POA: Diagnosis not present

## 2022-06-13 DIAGNOSIS — E1142 Type 2 diabetes mellitus with diabetic polyneuropathy: Secondary | ICD-10-CM | POA: Diagnosis not present

## 2022-06-13 DIAGNOSIS — G9529 Other cord compression: Secondary | ICD-10-CM | POA: Diagnosis not present

## 2022-06-13 DIAGNOSIS — K2100 Gastro-esophageal reflux disease with esophagitis, without bleeding: Secondary | ICD-10-CM | POA: Diagnosis not present

## 2022-06-13 DIAGNOSIS — M459 Ankylosing spondylitis of unspecified sites in spine: Secondary | ICD-10-CM | POA: Diagnosis not present

## 2022-06-13 DIAGNOSIS — E119 Type 2 diabetes mellitus without complications: Secondary | ICD-10-CM

## 2022-06-13 DIAGNOSIS — G8220 Paraplegia, unspecified: Secondary | ICD-10-CM | POA: Diagnosis not present

## 2022-06-13 DIAGNOSIS — Z7982 Long term (current) use of aspirin: Secondary | ICD-10-CM | POA: Diagnosis not present

## 2022-06-13 DIAGNOSIS — M479 Spondylosis, unspecified: Secondary | ICD-10-CM | POA: Diagnosis not present

## 2022-06-13 DIAGNOSIS — N182 Chronic kidney disease, stage 2 (mild): Secondary | ICD-10-CM | POA: Diagnosis not present

## 2022-06-13 DIAGNOSIS — E1122 Type 2 diabetes mellitus with diabetic chronic kidney disease: Secondary | ICD-10-CM | POA: Diagnosis not present

## 2022-06-13 DIAGNOSIS — I700 Atherosclerosis of aorta: Secondary | ICD-10-CM | POA: Diagnosis not present

## 2022-06-13 DIAGNOSIS — G319 Degenerative disease of nervous system, unspecified: Secondary | ICD-10-CM | POA: Diagnosis not present

## 2022-06-13 DIAGNOSIS — L89224 Pressure ulcer of left hip, stage 4: Secondary | ICD-10-CM | POA: Diagnosis not present

## 2022-06-13 DIAGNOSIS — E785 Hyperlipidemia, unspecified: Secondary | ICD-10-CM | POA: Diagnosis not present

## 2022-06-13 DIAGNOSIS — E876 Hypokalemia: Secondary | ICD-10-CM | POA: Diagnosis not present

## 2022-06-13 DIAGNOSIS — G8929 Other chronic pain: Secondary | ICD-10-CM | POA: Diagnosis not present

## 2022-06-13 DIAGNOSIS — M1990 Unspecified osteoarthritis, unspecified site: Secondary | ICD-10-CM | POA: Diagnosis not present

## 2022-06-13 DIAGNOSIS — M4800 Spinal stenosis, site unspecified: Secondary | ICD-10-CM | POA: Diagnosis not present

## 2022-06-13 NOTE — Telephone Encounter (Signed)
New message      1. Which medications need to be refilled? (please list name of each medication and dose if known) Insulin Glargine (BASAGLAR KWIKPEN) 100 UNIT/ML  2. Which pharmacy/location (including street and city if local pharmacy) is medication to be sent to?[facility]   3. Do they need a 30 day or 90 day supply? 30  day supply

## 2022-06-14 ENCOUNTER — Other Ambulatory Visit (HOSPITAL_COMMUNITY): Payer: Self-pay

## 2022-06-14 ENCOUNTER — Telehealth: Payer: Self-pay

## 2022-06-14 DIAGNOSIS — E119 Type 2 diabetes mellitus without complications: Secondary | ICD-10-CM

## 2022-06-14 MED ORDER — BASAGLAR KWIKPEN 100 UNIT/ML ~~LOC~~ SOPN: 30.0000 [IU] | PEN_INJECTOR | Freq: Every day | SUBCUTANEOUS | 2 refills | Status: DC

## 2022-06-14 NOTE — Telephone Encounter (Signed)
Patient Advocate Encounter   Received notification that prior authorization for Basaglar KwikPen 100UNIT/ML pen-injectors is required/requested.  Per Test Claim: LANTUS;TOUJEO;TRESIBA;LEVEMIR PREF'D   PA submitted on 06/14/22 to [employer] via CoverMyMeds Key [id number] Status is pending

## 2022-06-14 NOTE — Telephone Encounter (Signed)
Rx refill sent.

## 2022-06-14 NOTE — Telephone Encounter (Signed)
Patient Advocate Encounter  Received notification from [employer] that the request for prior authorization for Basaglar KwikPen 100UNIT/ML pen-injectors has been denied due to it is not on your plan's Drug List (formulary).   (1) You need to try two (2) of these covered drugs: (a) Lantus SoloStar, Toujeo Max SoloStar, or [employer]. (b) Levemir Flexpen. (c) Tyler Aas FlexTouch. (2) OR your doctor needs to give [place] specific medical reasons why two (2) of the covered drug(s) are not appropriate for you.  Per test claim, Lantus Solostar, Levemir Flexpen, and Tresiba FlexTouch all have a $35.00 copay for 30 day supply

## 2022-06-15 DIAGNOSIS — G8310 Monoplegia of lower limb affecting unspecified side: Secondary | ICD-10-CM | POA: Diagnosis not present

## 2022-06-15 DIAGNOSIS — L89223 Pressure ulcer of left hip, stage 3: Secondary | ICD-10-CM | POA: Diagnosis not present

## 2022-06-15 MED ORDER — INSULIN DEGLUDEC 100 UNIT/ML ~~LOC~~ SOPN: 20.0000 [IU] | PEN_INJECTOR | Freq: Every day | SUBCUTANEOUS | 1 refills | Status: DC

## 2022-06-15 NOTE — Telephone Encounter (Signed)
Rx for tresiba 20 units sq qhs sent to [facility].

## 2022-06-16 DIAGNOSIS — M479 Spondylosis, unspecified: Secondary | ICD-10-CM | POA: Diagnosis not present

## 2022-06-16 DIAGNOSIS — G8929 Other chronic pain: Secondary | ICD-10-CM | POA: Diagnosis not present

## 2022-06-16 DIAGNOSIS — E1142 Type 2 diabetes mellitus with diabetic polyneuropathy: Secondary | ICD-10-CM | POA: Diagnosis not present

## 2022-06-16 DIAGNOSIS — E876 Hypokalemia: Secondary | ICD-10-CM | POA: Diagnosis not present

## 2022-06-16 DIAGNOSIS — M1990 Unspecified osteoarthritis, unspecified site: Secondary | ICD-10-CM | POA: Diagnosis not present

## 2022-06-16 DIAGNOSIS — E785 Hyperlipidemia, unspecified: Secondary | ICD-10-CM | POA: Diagnosis not present

## 2022-06-16 DIAGNOSIS — I700 Atherosclerosis of aorta: Secondary | ICD-10-CM | POA: Diagnosis not present

## 2022-06-16 DIAGNOSIS — I129 Hypertensive chronic kidney disease with stage 1 through stage 4 chronic kidney disease, or unspecified chronic kidney disease: Secondary | ICD-10-CM | POA: Diagnosis not present

## 2022-06-16 DIAGNOSIS — N182 Chronic kidney disease, stage 2 (mild): Secondary | ICD-10-CM | POA: Diagnosis not present

## 2022-06-16 DIAGNOSIS — G8220 Paraplegia, unspecified: Secondary | ICD-10-CM | POA: Diagnosis not present

## 2022-06-16 DIAGNOSIS — N179 Acute kidney failure, unspecified: Secondary | ICD-10-CM | POA: Diagnosis not present

## 2022-06-16 DIAGNOSIS — M459 Ankylosing spondylitis of unspecified sites in spine: Secondary | ICD-10-CM | POA: Diagnosis not present

## 2022-06-16 DIAGNOSIS — G319 Degenerative disease of nervous system, unspecified: Secondary | ICD-10-CM | POA: Diagnosis not present

## 2022-06-16 DIAGNOSIS — G9341 Metabolic encephalopathy: Secondary | ICD-10-CM | POA: Diagnosis not present

## 2022-06-16 DIAGNOSIS — Z7982 Long term (current) use of aspirin: Secondary | ICD-10-CM | POA: Diagnosis not present

## 2022-06-16 DIAGNOSIS — M4800 Spinal stenosis, site unspecified: Secondary | ICD-10-CM | POA: Diagnosis not present

## 2022-06-16 DIAGNOSIS — Z7984 Long term (current) use of oral hypoglycemic drugs: Secondary | ICD-10-CM | POA: Diagnosis not present

## 2022-06-16 DIAGNOSIS — E1122 Type 2 diabetes mellitus with diabetic chronic kidney disease: Secondary | ICD-10-CM | POA: Diagnosis not present

## 2022-06-16 DIAGNOSIS — G9529 Other cord compression: Secondary | ICD-10-CM | POA: Diagnosis not present

## 2022-06-16 DIAGNOSIS — J449 Chronic obstructive pulmonary disease, unspecified: Secondary | ICD-10-CM | POA: Diagnosis not present

## 2022-06-16 DIAGNOSIS — K2100 Gastro-esophageal reflux disease with esophagitis, without bleeding: Secondary | ICD-10-CM | POA: Diagnosis not present

## 2022-06-16 DIAGNOSIS — L89224 Pressure ulcer of left hip, stage 4: Secondary | ICD-10-CM | POA: Diagnosis not present

## 2022-06-17 ENCOUNTER — Telehealth: Payer: Self-pay | Admitting: "Endocrinology

## 2022-06-17 DIAGNOSIS — E119 Type 2 diabetes mellitus without complications: Secondary | ICD-10-CM

## 2022-06-17 MED ORDER — INSULIN GLARGINE 100 UNIT/ML SOLOSTAR PEN: 30.0000 [IU] | PEN_INJECTOR | Freq: Every day | SUBCUTANEOUS | 0 refills | Status: DC

## 2022-06-17 NOTE — Telephone Encounter (Signed)
Rx for lantus 30 units sent to [facility] to replace basaglar.

## 2022-06-17 NOTE — Telephone Encounter (Signed)
New message    Pt c/o medication issue:  1. Name of Medication: Basaglar   2. How are you currently taking this medication (dosage and times per day)?   3. Are you having a reaction (difficulty breathing--STAT)?   4. What is your medication issue? The medication is high $ 200.00 looking for something cheaper.

## 2022-06-18 DIAGNOSIS — M4716 Other spondylosis with myelopathy, lumbar region: Secondary | ICD-10-CM | POA: Diagnosis not present

## 2022-06-18 DIAGNOSIS — J449 Chronic obstructive pulmonary disease, unspecified: Secondary | ICD-10-CM | POA: Diagnosis not present

## 2022-06-20 DIAGNOSIS — M459 Ankylosing spondylitis of unspecified sites in spine: Secondary | ICD-10-CM | POA: Diagnosis not present

## 2022-06-20 DIAGNOSIS — J449 Chronic obstructive pulmonary disease, unspecified: Secondary | ICD-10-CM | POA: Diagnosis not present

## 2022-06-20 DIAGNOSIS — E785 Hyperlipidemia, unspecified: Secondary | ICD-10-CM | POA: Diagnosis not present

## 2022-06-20 DIAGNOSIS — N179 Acute kidney failure, unspecified: Secondary | ICD-10-CM | POA: Diagnosis not present

## 2022-06-20 DIAGNOSIS — K2100 Gastro-esophageal reflux disease with esophagitis, without bleeding: Secondary | ICD-10-CM | POA: Diagnosis not present

## 2022-06-20 DIAGNOSIS — M4800 Spinal stenosis, site unspecified: Secondary | ICD-10-CM | POA: Diagnosis not present

## 2022-06-20 DIAGNOSIS — G8220 Paraplegia, unspecified: Secondary | ICD-10-CM | POA: Diagnosis not present

## 2022-06-20 DIAGNOSIS — G319 Degenerative disease of nervous system, unspecified: Secondary | ICD-10-CM | POA: Diagnosis not present

## 2022-06-20 DIAGNOSIS — L89224 Pressure ulcer of left hip, stage 4: Secondary | ICD-10-CM | POA: Diagnosis not present

## 2022-06-20 DIAGNOSIS — E876 Hypokalemia: Secondary | ICD-10-CM | POA: Diagnosis not present

## 2022-06-20 DIAGNOSIS — I129 Hypertensive chronic kidney disease with stage 1 through stage 4 chronic kidney disease, or unspecified chronic kidney disease: Secondary | ICD-10-CM | POA: Diagnosis not present

## 2022-06-20 DIAGNOSIS — I700 Atherosclerosis of aorta: Secondary | ICD-10-CM | POA: Diagnosis not present

## 2022-06-20 DIAGNOSIS — G9529 Other cord compression: Secondary | ICD-10-CM | POA: Diagnosis not present

## 2022-06-20 DIAGNOSIS — Z7984 Long term (current) use of oral hypoglycemic drugs: Secondary | ICD-10-CM | POA: Diagnosis not present

## 2022-06-20 DIAGNOSIS — G8929 Other chronic pain: Secondary | ICD-10-CM | POA: Diagnosis not present

## 2022-06-20 DIAGNOSIS — E1122 Type 2 diabetes mellitus with diabetic chronic kidney disease: Secondary | ICD-10-CM | POA: Diagnosis not present

## 2022-06-20 DIAGNOSIS — E1142 Type 2 diabetes mellitus with diabetic polyneuropathy: Secondary | ICD-10-CM | POA: Diagnosis not present

## 2022-06-20 DIAGNOSIS — M1990 Unspecified osteoarthritis, unspecified site: Secondary | ICD-10-CM | POA: Diagnosis not present

## 2022-06-20 DIAGNOSIS — N182 Chronic kidney disease, stage 2 (mild): Secondary | ICD-10-CM | POA: Diagnosis not present

## 2022-06-20 DIAGNOSIS — M479 Spondylosis, unspecified: Secondary | ICD-10-CM | POA: Diagnosis not present

## 2022-06-20 DIAGNOSIS — Z7982 Long term (current) use of aspirin: Secondary | ICD-10-CM | POA: Diagnosis not present

## 2022-06-21 DIAGNOSIS — Z299 Encounter for prophylactic measures, unspecified: Secondary | ICD-10-CM | POA: Diagnosis not present

## 2022-06-21 DIAGNOSIS — E1165 Type 2 diabetes mellitus with hyperglycemia: Secondary | ICD-10-CM | POA: Diagnosis not present

## 2022-06-21 DIAGNOSIS — E7800 Pure hypercholesterolemia, unspecified: Secondary | ICD-10-CM | POA: Diagnosis not present

## 2022-06-21 DIAGNOSIS — E1142 Type 2 diabetes mellitus with diabetic polyneuropathy: Secondary | ICD-10-CM | POA: Diagnosis not present

## 2022-06-21 DIAGNOSIS — Z0000 Encounter for general adult medical examination without abnormal findings: Secondary | ICD-10-CM | POA: Diagnosis not present

## 2022-06-21 DIAGNOSIS — I10 Essential (primary) hypertension: Secondary | ICD-10-CM | POA: Diagnosis not present

## 2022-06-23 DIAGNOSIS — M1990 Unspecified osteoarthritis, unspecified site: Secondary | ICD-10-CM | POA: Diagnosis not present

## 2022-06-23 DIAGNOSIS — G319 Degenerative disease of nervous system, unspecified: Secondary | ICD-10-CM | POA: Diagnosis not present

## 2022-06-23 DIAGNOSIS — N182 Chronic kidney disease, stage 2 (mild): Secondary | ICD-10-CM | POA: Diagnosis not present

## 2022-06-23 DIAGNOSIS — Z7982 Long term (current) use of aspirin: Secondary | ICD-10-CM | POA: Diagnosis not present

## 2022-06-23 DIAGNOSIS — L89224 Pressure ulcer of left hip, stage 4: Secondary | ICD-10-CM | POA: Diagnosis not present

## 2022-06-23 DIAGNOSIS — M479 Spondylosis, unspecified: Secondary | ICD-10-CM | POA: Diagnosis not present

## 2022-06-23 DIAGNOSIS — G9529 Other cord compression: Secondary | ICD-10-CM | POA: Diagnosis not present

## 2022-06-23 DIAGNOSIS — E1122 Type 2 diabetes mellitus with diabetic chronic kidney disease: Secondary | ICD-10-CM | POA: Diagnosis not present

## 2022-06-23 DIAGNOSIS — N179 Acute kidney failure, unspecified: Secondary | ICD-10-CM | POA: Diagnosis not present

## 2022-06-23 DIAGNOSIS — M4800 Spinal stenosis, site unspecified: Secondary | ICD-10-CM | POA: Diagnosis not present

## 2022-06-23 DIAGNOSIS — I700 Atherosclerosis of aorta: Secondary | ICD-10-CM | POA: Diagnosis not present

## 2022-06-23 DIAGNOSIS — M459 Ankylosing spondylitis of unspecified sites in spine: Secondary | ICD-10-CM | POA: Diagnosis not present

## 2022-06-23 DIAGNOSIS — J449 Chronic obstructive pulmonary disease, unspecified: Secondary | ICD-10-CM | POA: Diagnosis not present

## 2022-06-23 DIAGNOSIS — E785 Hyperlipidemia, unspecified: Secondary | ICD-10-CM | POA: Diagnosis not present

## 2022-06-23 DIAGNOSIS — K2100 Gastro-esophageal reflux disease with esophagitis, without bleeding: Secondary | ICD-10-CM | POA: Diagnosis not present

## 2022-06-23 DIAGNOSIS — G8220 Paraplegia, unspecified: Secondary | ICD-10-CM | POA: Diagnosis not present

## 2022-06-23 DIAGNOSIS — Z7984 Long term (current) use of oral hypoglycemic drugs: Secondary | ICD-10-CM | POA: Diagnosis not present

## 2022-06-23 DIAGNOSIS — G8929 Other chronic pain: Secondary | ICD-10-CM | POA: Diagnosis not present

## 2022-06-23 DIAGNOSIS — I129 Hypertensive chronic kidney disease with stage 1 through stage 4 chronic kidney disease, or unspecified chronic kidney disease: Secondary | ICD-10-CM | POA: Diagnosis not present

## 2022-06-23 DIAGNOSIS — E1142 Type 2 diabetes mellitus with diabetic polyneuropathy: Secondary | ICD-10-CM | POA: Diagnosis not present

## 2022-06-23 DIAGNOSIS — E876 Hypokalemia: Secondary | ICD-10-CM | POA: Diagnosis not present

## 2022-06-27 DIAGNOSIS — I129 Hypertensive chronic kidney disease with stage 1 through stage 4 chronic kidney disease, or unspecified chronic kidney disease: Secondary | ICD-10-CM | POA: Diagnosis not present

## 2022-06-27 DIAGNOSIS — Z7984 Long term (current) use of oral hypoglycemic drugs: Secondary | ICD-10-CM | POA: Diagnosis not present

## 2022-06-27 DIAGNOSIS — M4800 Spinal stenosis, site unspecified: Secondary | ICD-10-CM | POA: Diagnosis not present

## 2022-06-27 DIAGNOSIS — E1122 Type 2 diabetes mellitus with diabetic chronic kidney disease: Secondary | ICD-10-CM | POA: Diagnosis not present

## 2022-06-27 DIAGNOSIS — G8220 Paraplegia, unspecified: Secondary | ICD-10-CM | POA: Diagnosis not present

## 2022-06-27 DIAGNOSIS — I700 Atherosclerosis of aorta: Secondary | ICD-10-CM | POA: Diagnosis not present

## 2022-06-27 DIAGNOSIS — E1142 Type 2 diabetes mellitus with diabetic polyneuropathy: Secondary | ICD-10-CM | POA: Diagnosis not present

## 2022-06-27 DIAGNOSIS — E785 Hyperlipidemia, unspecified: Secondary | ICD-10-CM | POA: Diagnosis not present

## 2022-06-27 DIAGNOSIS — G9529 Other cord compression: Secondary | ICD-10-CM | POA: Diagnosis not present

## 2022-06-27 DIAGNOSIS — N179 Acute kidney failure, unspecified: Secondary | ICD-10-CM | POA: Diagnosis not present

## 2022-06-27 DIAGNOSIS — G8929 Other chronic pain: Secondary | ICD-10-CM | POA: Diagnosis not present

## 2022-06-27 DIAGNOSIS — J449 Chronic obstructive pulmonary disease, unspecified: Secondary | ICD-10-CM | POA: Diagnosis not present

## 2022-06-27 DIAGNOSIS — K2100 Gastro-esophageal reflux disease with esophagitis, without bleeding: Secondary | ICD-10-CM | POA: Diagnosis not present

## 2022-06-27 DIAGNOSIS — M459 Ankylosing spondylitis of unspecified sites in spine: Secondary | ICD-10-CM | POA: Diagnosis not present

## 2022-06-27 DIAGNOSIS — M479 Spondylosis, unspecified: Secondary | ICD-10-CM | POA: Diagnosis not present

## 2022-06-27 DIAGNOSIS — E876 Hypokalemia: Secondary | ICD-10-CM | POA: Diagnosis not present

## 2022-06-27 DIAGNOSIS — M1990 Unspecified osteoarthritis, unspecified site: Secondary | ICD-10-CM | POA: Diagnosis not present

## 2022-06-27 DIAGNOSIS — N182 Chronic kidney disease, stage 2 (mild): Secondary | ICD-10-CM | POA: Diagnosis not present

## 2022-06-27 DIAGNOSIS — L89224 Pressure ulcer of left hip, stage 4: Secondary | ICD-10-CM | POA: Diagnosis not present

## 2022-06-27 DIAGNOSIS — Z7982 Long term (current) use of aspirin: Secondary | ICD-10-CM | POA: Diagnosis not present

## 2022-06-27 DIAGNOSIS — G319 Degenerative disease of nervous system, unspecified: Secondary | ICD-10-CM | POA: Diagnosis not present

## 2022-06-30 DIAGNOSIS — M459 Ankylosing spondylitis of unspecified sites in spine: Secondary | ICD-10-CM | POA: Diagnosis not present

## 2022-06-30 DIAGNOSIS — K2100 Gastro-esophageal reflux disease with esophagitis, without bleeding: Secondary | ICD-10-CM | POA: Diagnosis not present

## 2022-06-30 DIAGNOSIS — G8929 Other chronic pain: Secondary | ICD-10-CM | POA: Diagnosis not present

## 2022-06-30 DIAGNOSIS — G9529 Other cord compression: Secondary | ICD-10-CM | POA: Diagnosis not present

## 2022-06-30 DIAGNOSIS — J449 Chronic obstructive pulmonary disease, unspecified: Secondary | ICD-10-CM | POA: Diagnosis not present

## 2022-06-30 DIAGNOSIS — G319 Degenerative disease of nervous system, unspecified: Secondary | ICD-10-CM | POA: Diagnosis not present

## 2022-06-30 DIAGNOSIS — N179 Acute kidney failure, unspecified: Secondary | ICD-10-CM | POA: Diagnosis not present

## 2022-06-30 DIAGNOSIS — Z7982 Long term (current) use of aspirin: Secondary | ICD-10-CM | POA: Diagnosis not present

## 2022-06-30 DIAGNOSIS — Z7984 Long term (current) use of oral hypoglycemic drugs: Secondary | ICD-10-CM | POA: Diagnosis not present

## 2022-06-30 DIAGNOSIS — M4800 Spinal stenosis, site unspecified: Secondary | ICD-10-CM | POA: Diagnosis not present

## 2022-06-30 DIAGNOSIS — I700 Atherosclerosis of aorta: Secondary | ICD-10-CM | POA: Diagnosis not present

## 2022-06-30 DIAGNOSIS — E1122 Type 2 diabetes mellitus with diabetic chronic kidney disease: Secondary | ICD-10-CM | POA: Diagnosis not present

## 2022-06-30 DIAGNOSIS — E785 Hyperlipidemia, unspecified: Secondary | ICD-10-CM | POA: Diagnosis not present

## 2022-06-30 DIAGNOSIS — M1990 Unspecified osteoarthritis, unspecified site: Secondary | ICD-10-CM | POA: Diagnosis not present

## 2022-06-30 DIAGNOSIS — M479 Spondylosis, unspecified: Secondary | ICD-10-CM | POA: Diagnosis not present

## 2022-06-30 DIAGNOSIS — I129 Hypertensive chronic kidney disease with stage 1 through stage 4 chronic kidney disease, or unspecified chronic kidney disease: Secondary | ICD-10-CM | POA: Diagnosis not present

## 2022-06-30 DIAGNOSIS — E876 Hypokalemia: Secondary | ICD-10-CM | POA: Diagnosis not present

## 2022-06-30 DIAGNOSIS — N182 Chronic kidney disease, stage 2 (mild): Secondary | ICD-10-CM | POA: Diagnosis not present

## 2022-06-30 DIAGNOSIS — E1142 Type 2 diabetes mellitus with diabetic polyneuropathy: Secondary | ICD-10-CM | POA: Diagnosis not present

## 2022-06-30 DIAGNOSIS — L89224 Pressure ulcer of left hip, stage 4: Secondary | ICD-10-CM | POA: Diagnosis not present

## 2022-06-30 DIAGNOSIS — G8220 Paraplegia, unspecified: Secondary | ICD-10-CM | POA: Diagnosis not present

## 2022-07-01 DIAGNOSIS — N390 Urinary tract infection, site not specified: Secondary | ICD-10-CM | POA: Diagnosis not present

## 2022-07-01 DIAGNOSIS — E119 Type 2 diabetes mellitus without complications: Secondary | ICD-10-CM | POA: Diagnosis not present

## 2022-07-05 DIAGNOSIS — M479 Spondylosis, unspecified: Secondary | ICD-10-CM | POA: Diagnosis not present

## 2022-07-05 DIAGNOSIS — M1990 Unspecified osteoarthritis, unspecified site: Secondary | ICD-10-CM | POA: Diagnosis not present

## 2022-07-05 DIAGNOSIS — Z7982 Long term (current) use of aspirin: Secondary | ICD-10-CM | POA: Diagnosis not present

## 2022-07-05 DIAGNOSIS — G9529 Other cord compression: Secondary | ICD-10-CM | POA: Diagnosis not present

## 2022-07-05 DIAGNOSIS — N179 Acute kidney failure, unspecified: Secondary | ICD-10-CM | POA: Diagnosis not present

## 2022-07-05 DIAGNOSIS — E1142 Type 2 diabetes mellitus with diabetic polyneuropathy: Secondary | ICD-10-CM | POA: Diagnosis not present

## 2022-07-05 DIAGNOSIS — L89224 Pressure ulcer of left hip, stage 4: Secondary | ICD-10-CM | POA: Diagnosis not present

## 2022-07-05 DIAGNOSIS — G8929 Other chronic pain: Secondary | ICD-10-CM | POA: Diagnosis not present

## 2022-07-05 DIAGNOSIS — N182 Chronic kidney disease, stage 2 (mild): Secondary | ICD-10-CM | POA: Diagnosis not present

## 2022-07-05 DIAGNOSIS — E876 Hypokalemia: Secondary | ICD-10-CM | POA: Diagnosis not present

## 2022-07-05 DIAGNOSIS — I129 Hypertensive chronic kidney disease with stage 1 through stage 4 chronic kidney disease, or unspecified chronic kidney disease: Secondary | ICD-10-CM | POA: Diagnosis not present

## 2022-07-05 DIAGNOSIS — I700 Atherosclerosis of aorta: Secondary | ICD-10-CM | POA: Diagnosis not present

## 2022-07-05 DIAGNOSIS — G319 Degenerative disease of nervous system, unspecified: Secondary | ICD-10-CM | POA: Diagnosis not present

## 2022-07-05 DIAGNOSIS — M459 Ankylosing spondylitis of unspecified sites in spine: Secondary | ICD-10-CM | POA: Diagnosis not present

## 2022-07-05 DIAGNOSIS — G8220 Paraplegia, unspecified: Secondary | ICD-10-CM | POA: Diagnosis not present

## 2022-07-05 DIAGNOSIS — M4800 Spinal stenosis, site unspecified: Secondary | ICD-10-CM | POA: Diagnosis not present

## 2022-07-05 DIAGNOSIS — K2100 Gastro-esophageal reflux disease with esophagitis, without bleeding: Secondary | ICD-10-CM | POA: Diagnosis not present

## 2022-07-05 DIAGNOSIS — Z7984 Long term (current) use of oral hypoglycemic drugs: Secondary | ICD-10-CM | POA: Diagnosis not present

## 2022-07-05 DIAGNOSIS — E785 Hyperlipidemia, unspecified: Secondary | ICD-10-CM | POA: Diagnosis not present

## 2022-07-05 DIAGNOSIS — J449 Chronic obstructive pulmonary disease, unspecified: Secondary | ICD-10-CM | POA: Diagnosis not present

## 2022-07-05 DIAGNOSIS — E1122 Type 2 diabetes mellitus with diabetic chronic kidney disease: Secondary | ICD-10-CM | POA: Diagnosis not present

## 2022-07-08 DIAGNOSIS — G9529 Other cord compression: Secondary | ICD-10-CM | POA: Diagnosis not present

## 2022-07-08 DIAGNOSIS — M1990 Unspecified osteoarthritis, unspecified site: Secondary | ICD-10-CM | POA: Diagnosis not present

## 2022-07-08 DIAGNOSIS — G8929 Other chronic pain: Secondary | ICD-10-CM | POA: Diagnosis not present

## 2022-07-08 DIAGNOSIS — Z7984 Long term (current) use of oral hypoglycemic drugs: Secondary | ICD-10-CM | POA: Diagnosis not present

## 2022-07-08 DIAGNOSIS — I129 Hypertensive chronic kidney disease with stage 1 through stage 4 chronic kidney disease, or unspecified chronic kidney disease: Secondary | ICD-10-CM | POA: Diagnosis not present

## 2022-07-08 DIAGNOSIS — E785 Hyperlipidemia, unspecified: Secondary | ICD-10-CM | POA: Diagnosis not present

## 2022-07-08 DIAGNOSIS — M479 Spondylosis, unspecified: Secondary | ICD-10-CM | POA: Diagnosis not present

## 2022-07-08 DIAGNOSIS — K2100 Gastro-esophageal reflux disease with esophagitis, without bleeding: Secondary | ICD-10-CM | POA: Diagnosis not present

## 2022-07-08 DIAGNOSIS — Z7982 Long term (current) use of aspirin: Secondary | ICD-10-CM | POA: Diagnosis not present

## 2022-07-08 DIAGNOSIS — G319 Degenerative disease of nervous system, unspecified: Secondary | ICD-10-CM | POA: Diagnosis not present

## 2022-07-08 DIAGNOSIS — E1142 Type 2 diabetes mellitus with diabetic polyneuropathy: Secondary | ICD-10-CM | POA: Diagnosis not present

## 2022-07-08 DIAGNOSIS — J449 Chronic obstructive pulmonary disease, unspecified: Secondary | ICD-10-CM | POA: Diagnosis not present

## 2022-07-08 DIAGNOSIS — N182 Chronic kidney disease, stage 2 (mild): Secondary | ICD-10-CM | POA: Diagnosis not present

## 2022-07-08 DIAGNOSIS — E1122 Type 2 diabetes mellitus with diabetic chronic kidney disease: Secondary | ICD-10-CM | POA: Diagnosis not present

## 2022-07-08 DIAGNOSIS — I700 Atherosclerosis of aorta: Secondary | ICD-10-CM | POA: Diagnosis not present

## 2022-07-08 DIAGNOSIS — L89224 Pressure ulcer of left hip, stage 4: Secondary | ICD-10-CM | POA: Diagnosis not present

## 2022-07-08 DIAGNOSIS — M459 Ankylosing spondylitis of unspecified sites in spine: Secondary | ICD-10-CM | POA: Diagnosis not present

## 2022-07-08 DIAGNOSIS — G8220 Paraplegia, unspecified: Secondary | ICD-10-CM | POA: Diagnosis not present

## 2022-07-08 DIAGNOSIS — E876 Hypokalemia: Secondary | ICD-10-CM | POA: Diagnosis not present

## 2022-07-08 DIAGNOSIS — M4800 Spinal stenosis, site unspecified: Secondary | ICD-10-CM | POA: Diagnosis not present

## 2022-07-08 DIAGNOSIS — N179 Acute kidney failure, unspecified: Secondary | ICD-10-CM | POA: Diagnosis not present

## 2022-07-10 DIAGNOSIS — J449 Chronic obstructive pulmonary disease, unspecified: Secondary | ICD-10-CM | POA: Diagnosis not present

## 2022-07-10 DIAGNOSIS — M4716 Other spondylosis with myelopathy, lumbar region: Secondary | ICD-10-CM | POA: Diagnosis not present

## 2022-07-11 DIAGNOSIS — E785 Hyperlipidemia, unspecified: Secondary | ICD-10-CM | POA: Diagnosis not present

## 2022-07-11 DIAGNOSIS — M459 Ankylosing spondylitis of unspecified sites in spine: Secondary | ICD-10-CM | POA: Diagnosis not present

## 2022-07-11 DIAGNOSIS — Z7982 Long term (current) use of aspirin: Secondary | ICD-10-CM | POA: Diagnosis not present

## 2022-07-11 DIAGNOSIS — E1142 Type 2 diabetes mellitus with diabetic polyneuropathy: Secondary | ICD-10-CM | POA: Diagnosis not present

## 2022-07-11 DIAGNOSIS — G8929 Other chronic pain: Secondary | ICD-10-CM | POA: Diagnosis not present

## 2022-07-11 DIAGNOSIS — Z7984 Long term (current) use of oral hypoglycemic drugs: Secondary | ICD-10-CM | POA: Diagnosis not present

## 2022-07-11 DIAGNOSIS — G8220 Paraplegia, unspecified: Secondary | ICD-10-CM | POA: Diagnosis not present

## 2022-07-11 DIAGNOSIS — M1990 Unspecified osteoarthritis, unspecified site: Secondary | ICD-10-CM | POA: Diagnosis not present

## 2022-07-11 DIAGNOSIS — N182 Chronic kidney disease, stage 2 (mild): Secondary | ICD-10-CM | POA: Diagnosis not present

## 2022-07-11 DIAGNOSIS — K2100 Gastro-esophageal reflux disease with esophagitis, without bleeding: Secondary | ICD-10-CM | POA: Diagnosis not present

## 2022-07-11 DIAGNOSIS — J449 Chronic obstructive pulmonary disease, unspecified: Secondary | ICD-10-CM | POA: Diagnosis not present

## 2022-07-11 DIAGNOSIS — I700 Atherosclerosis of aorta: Secondary | ICD-10-CM | POA: Diagnosis not present

## 2022-07-11 DIAGNOSIS — G9529 Other cord compression: Secondary | ICD-10-CM | POA: Diagnosis not present

## 2022-07-11 DIAGNOSIS — E876 Hypokalemia: Secondary | ICD-10-CM | POA: Diagnosis not present

## 2022-07-11 DIAGNOSIS — N179 Acute kidney failure, unspecified: Secondary | ICD-10-CM | POA: Diagnosis not present

## 2022-07-11 DIAGNOSIS — L89224 Pressure ulcer of left hip, stage 4: Secondary | ICD-10-CM | POA: Diagnosis not present

## 2022-07-11 DIAGNOSIS — E1122 Type 2 diabetes mellitus with diabetic chronic kidney disease: Secondary | ICD-10-CM | POA: Diagnosis not present

## 2022-07-11 DIAGNOSIS — G319 Degenerative disease of nervous system, unspecified: Secondary | ICD-10-CM | POA: Diagnosis not present

## 2022-07-11 DIAGNOSIS — M4800 Spinal stenosis, site unspecified: Secondary | ICD-10-CM | POA: Diagnosis not present

## 2022-07-11 DIAGNOSIS — I129 Hypertensive chronic kidney disease with stage 1 through stage 4 chronic kidney disease, or unspecified chronic kidney disease: Secondary | ICD-10-CM | POA: Diagnosis not present

## 2022-07-11 DIAGNOSIS — M479 Spondylosis, unspecified: Secondary | ICD-10-CM | POA: Diagnosis not present

## 2022-07-13 DIAGNOSIS — S71002A Unspecified open wound, left hip, initial encounter: Secondary | ICD-10-CM | POA: Diagnosis not present

## 2022-07-13 DIAGNOSIS — Z87891 Personal history of nicotine dependence: Secondary | ICD-10-CM | POA: Diagnosis not present

## 2022-07-13 DIAGNOSIS — J449 Chronic obstructive pulmonary disease, unspecified: Secondary | ICD-10-CM | POA: Diagnosis not present

## 2022-07-13 DIAGNOSIS — I10 Essential (primary) hypertension: Secondary | ICD-10-CM | POA: Diagnosis not present

## 2022-07-13 DIAGNOSIS — E1142 Type 2 diabetes mellitus with diabetic polyneuropathy: Secondary | ICD-10-CM | POA: Diagnosis not present

## 2022-07-13 DIAGNOSIS — K2100 Gastro-esophageal reflux disease with esophagitis, without bleeding: Secondary | ICD-10-CM | POA: Diagnosis not present

## 2022-07-13 DIAGNOSIS — Z7982 Long term (current) use of aspirin: Secondary | ICD-10-CM | POA: Diagnosis not present

## 2022-07-13 DIAGNOSIS — E7800 Pure hypercholesterolemia, unspecified: Secondary | ICD-10-CM | POA: Diagnosis not present

## 2022-07-13 DIAGNOSIS — Z885 Allergy status to narcotic agent status: Secondary | ICD-10-CM | POA: Diagnosis not present

## 2022-07-13 DIAGNOSIS — S71002D Unspecified open wound, left hip, subsequent encounter: Secondary | ICD-10-CM | POA: Diagnosis not present

## 2022-07-13 DIAGNOSIS — Z79899 Other long term (current) drug therapy: Secondary | ICD-10-CM | POA: Diagnosis not present

## 2022-07-13 DIAGNOSIS — Z7984 Long term (current) use of oral hypoglycemic drugs: Secondary | ICD-10-CM | POA: Diagnosis not present

## 2022-07-13 DIAGNOSIS — Z86711 Personal history of pulmonary embolism: Secondary | ICD-10-CM | POA: Diagnosis not present

## 2022-07-15 DIAGNOSIS — M1990 Unspecified osteoarthritis, unspecified site: Secondary | ICD-10-CM | POA: Diagnosis not present

## 2022-07-15 DIAGNOSIS — L89224 Pressure ulcer of left hip, stage 4: Secondary | ICD-10-CM | POA: Diagnosis not present

## 2022-07-15 DIAGNOSIS — Z7982 Long term (current) use of aspirin: Secondary | ICD-10-CM | POA: Diagnosis not present

## 2022-07-15 DIAGNOSIS — E1122 Type 2 diabetes mellitus with diabetic chronic kidney disease: Secondary | ICD-10-CM | POA: Diagnosis not present

## 2022-07-15 DIAGNOSIS — G8220 Paraplegia, unspecified: Secondary | ICD-10-CM | POA: Diagnosis not present

## 2022-07-15 DIAGNOSIS — K2100 Gastro-esophageal reflux disease with esophagitis, without bleeding: Secondary | ICD-10-CM | POA: Diagnosis not present

## 2022-07-15 DIAGNOSIS — N179 Acute kidney failure, unspecified: Secondary | ICD-10-CM | POA: Diagnosis not present

## 2022-07-15 DIAGNOSIS — Z7984 Long term (current) use of oral hypoglycemic drugs: Secondary | ICD-10-CM | POA: Diagnosis not present

## 2022-07-15 DIAGNOSIS — E876 Hypokalemia: Secondary | ICD-10-CM | POA: Diagnosis not present

## 2022-07-15 DIAGNOSIS — E785 Hyperlipidemia, unspecified: Secondary | ICD-10-CM | POA: Diagnosis not present

## 2022-07-15 DIAGNOSIS — G9529 Other cord compression: Secondary | ICD-10-CM | POA: Diagnosis not present

## 2022-07-15 DIAGNOSIS — N182 Chronic kidney disease, stage 2 (mild): Secondary | ICD-10-CM | POA: Diagnosis not present

## 2022-07-15 DIAGNOSIS — M459 Ankylosing spondylitis of unspecified sites in spine: Secondary | ICD-10-CM | POA: Diagnosis not present

## 2022-07-15 DIAGNOSIS — E1142 Type 2 diabetes mellitus with diabetic polyneuropathy: Secondary | ICD-10-CM | POA: Diagnosis not present

## 2022-07-15 DIAGNOSIS — G8929 Other chronic pain: Secondary | ICD-10-CM | POA: Diagnosis not present

## 2022-07-15 DIAGNOSIS — G319 Degenerative disease of nervous system, unspecified: Secondary | ICD-10-CM | POA: Diagnosis not present

## 2022-07-15 DIAGNOSIS — M4800 Spinal stenosis, site unspecified: Secondary | ICD-10-CM | POA: Diagnosis not present

## 2022-07-15 DIAGNOSIS — J449 Chronic obstructive pulmonary disease, unspecified: Secondary | ICD-10-CM | POA: Diagnosis not present

## 2022-07-15 DIAGNOSIS — I129 Hypertensive chronic kidney disease with stage 1 through stage 4 chronic kidney disease, or unspecified chronic kidney disease: Secondary | ICD-10-CM | POA: Diagnosis not present

## 2022-07-15 DIAGNOSIS — M479 Spondylosis, unspecified: Secondary | ICD-10-CM | POA: Diagnosis not present

## 2022-07-15 DIAGNOSIS — I700 Atherosclerosis of aorta: Secondary | ICD-10-CM | POA: Diagnosis not present

## 2022-07-17 DIAGNOSIS — G9341 Metabolic encephalopathy: Secondary | ICD-10-CM | POA: Diagnosis not present

## 2022-07-19 DIAGNOSIS — Z7984 Long term (current) use of oral hypoglycemic drugs: Secondary | ICD-10-CM | POA: Diagnosis not present

## 2022-07-19 DIAGNOSIS — I129 Hypertensive chronic kidney disease with stage 1 through stage 4 chronic kidney disease, or unspecified chronic kidney disease: Secondary | ICD-10-CM | POA: Diagnosis not present

## 2022-07-19 DIAGNOSIS — M4716 Other spondylosis with myelopathy, lumbar region: Secondary | ICD-10-CM | POA: Diagnosis not present

## 2022-07-19 DIAGNOSIS — G8220 Paraplegia, unspecified: Secondary | ICD-10-CM | POA: Diagnosis not present

## 2022-07-19 DIAGNOSIS — I700 Atherosclerosis of aorta: Secondary | ICD-10-CM | POA: Diagnosis not present

## 2022-07-19 DIAGNOSIS — M479 Spondylosis, unspecified: Secondary | ICD-10-CM | POA: Diagnosis not present

## 2022-07-19 DIAGNOSIS — K2100 Gastro-esophageal reflux disease with esophagitis, without bleeding: Secondary | ICD-10-CM | POA: Diagnosis not present

## 2022-07-19 DIAGNOSIS — N182 Chronic kidney disease, stage 2 (mild): Secondary | ICD-10-CM | POA: Diagnosis not present

## 2022-07-19 DIAGNOSIS — E1122 Type 2 diabetes mellitus with diabetic chronic kidney disease: Secondary | ICD-10-CM | POA: Diagnosis not present

## 2022-07-19 DIAGNOSIS — E1142 Type 2 diabetes mellitus with diabetic polyneuropathy: Secondary | ICD-10-CM | POA: Diagnosis not present

## 2022-07-19 DIAGNOSIS — E785 Hyperlipidemia, unspecified: Secondary | ICD-10-CM | POA: Diagnosis not present

## 2022-07-19 DIAGNOSIS — E876 Hypokalemia: Secondary | ICD-10-CM | POA: Diagnosis not present

## 2022-07-19 DIAGNOSIS — G8929 Other chronic pain: Secondary | ICD-10-CM | POA: Diagnosis not present

## 2022-07-19 DIAGNOSIS — M4800 Spinal stenosis, site unspecified: Secondary | ICD-10-CM | POA: Diagnosis not present

## 2022-07-19 DIAGNOSIS — M459 Ankylosing spondylitis of unspecified sites in spine: Secondary | ICD-10-CM | POA: Diagnosis not present

## 2022-07-19 DIAGNOSIS — N179 Acute kidney failure, unspecified: Secondary | ICD-10-CM | POA: Diagnosis not present

## 2022-07-19 DIAGNOSIS — G9529 Other cord compression: Secondary | ICD-10-CM | POA: Diagnosis not present

## 2022-07-19 DIAGNOSIS — Z7982 Long term (current) use of aspirin: Secondary | ICD-10-CM | POA: Diagnosis not present

## 2022-07-19 DIAGNOSIS — M1990 Unspecified osteoarthritis, unspecified site: Secondary | ICD-10-CM | POA: Diagnosis not present

## 2022-07-19 DIAGNOSIS — L89224 Pressure ulcer of left hip, stage 4: Secondary | ICD-10-CM | POA: Diagnosis not present

## 2022-07-19 DIAGNOSIS — G319 Degenerative disease of nervous system, unspecified: Secondary | ICD-10-CM | POA: Diagnosis not present

## 2022-07-19 DIAGNOSIS — J449 Chronic obstructive pulmonary disease, unspecified: Secondary | ICD-10-CM | POA: Diagnosis not present

## 2022-07-21 DIAGNOSIS — G8220 Paraplegia, unspecified: Secondary | ICD-10-CM | POA: Diagnosis not present

## 2022-07-21 DIAGNOSIS — E1122 Type 2 diabetes mellitus with diabetic chronic kidney disease: Secondary | ICD-10-CM | POA: Diagnosis not present

## 2022-07-21 DIAGNOSIS — L89324 Pressure ulcer of left buttock, stage 4: Secondary | ICD-10-CM | POA: Diagnosis not present

## 2022-07-21 DIAGNOSIS — G9529 Other cord compression: Secondary | ICD-10-CM | POA: Diagnosis not present

## 2022-07-21 DIAGNOSIS — I129 Hypertensive chronic kidney disease with stage 1 through stage 4 chronic kidney disease, or unspecified chronic kidney disease: Secondary | ICD-10-CM | POA: Diagnosis not present

## 2022-07-22 DIAGNOSIS — N179 Acute kidney failure, unspecified: Secondary | ICD-10-CM | POA: Diagnosis not present

## 2022-07-22 DIAGNOSIS — G8929 Other chronic pain: Secondary | ICD-10-CM | POA: Diagnosis not present

## 2022-07-22 DIAGNOSIS — K2100 Gastro-esophageal reflux disease with esophagitis, without bleeding: Secondary | ICD-10-CM | POA: Diagnosis not present

## 2022-07-22 DIAGNOSIS — Z7982 Long term (current) use of aspirin: Secondary | ICD-10-CM | POA: Diagnosis not present

## 2022-07-22 DIAGNOSIS — E1142 Type 2 diabetes mellitus with diabetic polyneuropathy: Secondary | ICD-10-CM | POA: Diagnosis not present

## 2022-07-22 DIAGNOSIS — J449 Chronic obstructive pulmonary disease, unspecified: Secondary | ICD-10-CM | POA: Diagnosis not present

## 2022-07-22 DIAGNOSIS — E1122 Type 2 diabetes mellitus with diabetic chronic kidney disease: Secondary | ICD-10-CM | POA: Diagnosis not present

## 2022-07-22 DIAGNOSIS — M479 Spondylosis, unspecified: Secondary | ICD-10-CM | POA: Diagnosis not present

## 2022-07-22 DIAGNOSIS — E785 Hyperlipidemia, unspecified: Secondary | ICD-10-CM | POA: Diagnosis not present

## 2022-07-22 DIAGNOSIS — G8220 Paraplegia, unspecified: Secondary | ICD-10-CM | POA: Diagnosis not present

## 2022-07-22 DIAGNOSIS — N182 Chronic kidney disease, stage 2 (mild): Secondary | ICD-10-CM | POA: Diagnosis not present

## 2022-07-22 DIAGNOSIS — L89224 Pressure ulcer of left hip, stage 4: Secondary | ICD-10-CM | POA: Diagnosis not present

## 2022-07-22 DIAGNOSIS — I129 Hypertensive chronic kidney disease with stage 1 through stage 4 chronic kidney disease, or unspecified chronic kidney disease: Secondary | ICD-10-CM | POA: Diagnosis not present

## 2022-07-22 DIAGNOSIS — M1990 Unspecified osteoarthritis, unspecified site: Secondary | ICD-10-CM | POA: Diagnosis not present

## 2022-07-22 DIAGNOSIS — G9529 Other cord compression: Secondary | ICD-10-CM | POA: Diagnosis not present

## 2022-07-22 DIAGNOSIS — I700 Atherosclerosis of aorta: Secondary | ICD-10-CM | POA: Diagnosis not present

## 2022-07-22 DIAGNOSIS — Z7984 Long term (current) use of oral hypoglycemic drugs: Secondary | ICD-10-CM | POA: Diagnosis not present

## 2022-07-22 DIAGNOSIS — E876 Hypokalemia: Secondary | ICD-10-CM | POA: Diagnosis not present

## 2022-07-22 DIAGNOSIS — M4800 Spinal stenosis, site unspecified: Secondary | ICD-10-CM | POA: Diagnosis not present

## 2022-07-22 DIAGNOSIS — G319 Degenerative disease of nervous system, unspecified: Secondary | ICD-10-CM | POA: Diagnosis not present

## 2022-07-22 DIAGNOSIS — M459 Ankylosing spondylitis of unspecified sites in spine: Secondary | ICD-10-CM | POA: Diagnosis not present

## 2022-07-26 DIAGNOSIS — J449 Chronic obstructive pulmonary disease, unspecified: Secondary | ICD-10-CM | POA: Diagnosis not present

## 2022-07-26 DIAGNOSIS — N182 Chronic kidney disease, stage 2 (mild): Secondary | ICD-10-CM | POA: Diagnosis not present

## 2022-07-26 DIAGNOSIS — Z7984 Long term (current) use of oral hypoglycemic drugs: Secondary | ICD-10-CM | POA: Diagnosis not present

## 2022-07-26 DIAGNOSIS — G9529 Other cord compression: Secondary | ICD-10-CM | POA: Diagnosis not present

## 2022-07-26 DIAGNOSIS — M459 Ankylosing spondylitis of unspecified sites in spine: Secondary | ICD-10-CM | POA: Diagnosis not present

## 2022-07-26 DIAGNOSIS — Z7982 Long term (current) use of aspirin: Secondary | ICD-10-CM | POA: Diagnosis not present

## 2022-07-26 DIAGNOSIS — E1142 Type 2 diabetes mellitus with diabetic polyneuropathy: Secondary | ICD-10-CM | POA: Diagnosis not present

## 2022-07-26 DIAGNOSIS — G8220 Paraplegia, unspecified: Secondary | ICD-10-CM | POA: Diagnosis not present

## 2022-07-26 DIAGNOSIS — E785 Hyperlipidemia, unspecified: Secondary | ICD-10-CM | POA: Diagnosis not present

## 2022-07-26 DIAGNOSIS — I129 Hypertensive chronic kidney disease with stage 1 through stage 4 chronic kidney disease, or unspecified chronic kidney disease: Secondary | ICD-10-CM | POA: Diagnosis not present

## 2022-07-26 DIAGNOSIS — L89224 Pressure ulcer of left hip, stage 4: Secondary | ICD-10-CM | POA: Diagnosis not present

## 2022-07-26 DIAGNOSIS — I700 Atherosclerosis of aorta: Secondary | ICD-10-CM | POA: Diagnosis not present

## 2022-07-26 DIAGNOSIS — M4800 Spinal stenosis, site unspecified: Secondary | ICD-10-CM | POA: Diagnosis not present

## 2022-07-26 DIAGNOSIS — N179 Acute kidney failure, unspecified: Secondary | ICD-10-CM | POA: Diagnosis not present

## 2022-07-26 DIAGNOSIS — G319 Degenerative disease of nervous system, unspecified: Secondary | ICD-10-CM | POA: Diagnosis not present

## 2022-07-26 DIAGNOSIS — M1990 Unspecified osteoarthritis, unspecified site: Secondary | ICD-10-CM | POA: Diagnosis not present

## 2022-07-26 DIAGNOSIS — G8929 Other chronic pain: Secondary | ICD-10-CM | POA: Diagnosis not present

## 2022-07-26 DIAGNOSIS — K2100 Gastro-esophageal reflux disease with esophagitis, without bleeding: Secondary | ICD-10-CM | POA: Diagnosis not present

## 2022-07-26 DIAGNOSIS — E1122 Type 2 diabetes mellitus with diabetic chronic kidney disease: Secondary | ICD-10-CM | POA: Diagnosis not present

## 2022-07-26 DIAGNOSIS — M479 Spondylosis, unspecified: Secondary | ICD-10-CM | POA: Diagnosis not present

## 2022-07-26 DIAGNOSIS — E876 Hypokalemia: Secondary | ICD-10-CM | POA: Diagnosis not present

## 2022-07-28 DIAGNOSIS — E876 Hypokalemia: Secondary | ICD-10-CM | POA: Diagnosis not present

## 2022-07-28 DIAGNOSIS — M459 Ankylosing spondylitis of unspecified sites in spine: Secondary | ICD-10-CM | POA: Diagnosis not present

## 2022-07-28 DIAGNOSIS — K2100 Gastro-esophageal reflux disease with esophagitis, without bleeding: Secondary | ICD-10-CM | POA: Diagnosis not present

## 2022-07-28 DIAGNOSIS — E1122 Type 2 diabetes mellitus with diabetic chronic kidney disease: Secondary | ICD-10-CM | POA: Diagnosis not present

## 2022-07-28 DIAGNOSIS — J449 Chronic obstructive pulmonary disease, unspecified: Secondary | ICD-10-CM | POA: Diagnosis not present

## 2022-07-28 DIAGNOSIS — E785 Hyperlipidemia, unspecified: Secondary | ICD-10-CM | POA: Diagnosis not present

## 2022-07-28 DIAGNOSIS — I129 Hypertensive chronic kidney disease with stage 1 through stage 4 chronic kidney disease, or unspecified chronic kidney disease: Secondary | ICD-10-CM | POA: Diagnosis not present

## 2022-07-28 DIAGNOSIS — G9529 Other cord compression: Secondary | ICD-10-CM | POA: Diagnosis not present

## 2022-07-28 DIAGNOSIS — Z7984 Long term (current) use of oral hypoglycemic drugs: Secondary | ICD-10-CM | POA: Diagnosis not present

## 2022-07-28 DIAGNOSIS — L89224 Pressure ulcer of left hip, stage 4: Secondary | ICD-10-CM | POA: Diagnosis not present

## 2022-07-28 DIAGNOSIS — Z7982 Long term (current) use of aspirin: Secondary | ICD-10-CM | POA: Diagnosis not present

## 2022-07-28 DIAGNOSIS — E1142 Type 2 diabetes mellitus with diabetic polyneuropathy: Secondary | ICD-10-CM | POA: Diagnosis not present

## 2022-07-28 DIAGNOSIS — G8220 Paraplegia, unspecified: Secondary | ICD-10-CM | POA: Diagnosis not present

## 2022-07-28 DIAGNOSIS — G319 Degenerative disease of nervous system, unspecified: Secondary | ICD-10-CM | POA: Diagnosis not present

## 2022-07-28 DIAGNOSIS — M4800 Spinal stenosis, site unspecified: Secondary | ICD-10-CM | POA: Diagnosis not present

## 2022-07-28 DIAGNOSIS — I700 Atherosclerosis of aorta: Secondary | ICD-10-CM | POA: Diagnosis not present

## 2022-07-28 DIAGNOSIS — N179 Acute kidney failure, unspecified: Secondary | ICD-10-CM | POA: Diagnosis not present

## 2022-07-28 DIAGNOSIS — M479 Spondylosis, unspecified: Secondary | ICD-10-CM | POA: Diagnosis not present

## 2022-07-28 DIAGNOSIS — M1990 Unspecified osteoarthritis, unspecified site: Secondary | ICD-10-CM | POA: Diagnosis not present

## 2022-07-28 DIAGNOSIS — G8929 Other chronic pain: Secondary | ICD-10-CM | POA: Diagnosis not present

## 2022-07-28 DIAGNOSIS — N182 Chronic kidney disease, stage 2 (mild): Secondary | ICD-10-CM | POA: Diagnosis not present

## 2022-08-02 DIAGNOSIS — G8220 Paraplegia, unspecified: Secondary | ICD-10-CM | POA: Diagnosis not present

## 2022-08-02 DIAGNOSIS — M1990 Unspecified osteoarthritis, unspecified site: Secondary | ICD-10-CM | POA: Diagnosis not present

## 2022-08-02 DIAGNOSIS — J449 Chronic obstructive pulmonary disease, unspecified: Secondary | ICD-10-CM | POA: Diagnosis not present

## 2022-08-02 DIAGNOSIS — E1142 Type 2 diabetes mellitus with diabetic polyneuropathy: Secondary | ICD-10-CM | POA: Diagnosis not present

## 2022-08-02 DIAGNOSIS — G8929 Other chronic pain: Secondary | ICD-10-CM | POA: Diagnosis not present

## 2022-08-02 DIAGNOSIS — G319 Degenerative disease of nervous system, unspecified: Secondary | ICD-10-CM | POA: Diagnosis not present

## 2022-08-02 DIAGNOSIS — Z7982 Long term (current) use of aspirin: Secondary | ICD-10-CM | POA: Diagnosis not present

## 2022-08-02 DIAGNOSIS — E1122 Type 2 diabetes mellitus with diabetic chronic kidney disease: Secondary | ICD-10-CM | POA: Diagnosis not present

## 2022-08-02 DIAGNOSIS — E876 Hypokalemia: Secondary | ICD-10-CM | POA: Diagnosis not present

## 2022-08-02 DIAGNOSIS — M4800 Spinal stenosis, site unspecified: Secondary | ICD-10-CM | POA: Diagnosis not present

## 2022-08-02 DIAGNOSIS — L89224 Pressure ulcer of left hip, stage 4: Secondary | ICD-10-CM | POA: Diagnosis not present

## 2022-08-02 DIAGNOSIS — I700 Atherosclerosis of aorta: Secondary | ICD-10-CM | POA: Diagnosis not present

## 2022-08-02 DIAGNOSIS — N182 Chronic kidney disease, stage 2 (mild): Secondary | ICD-10-CM | POA: Diagnosis not present

## 2022-08-02 DIAGNOSIS — Z791 Long term (current) use of non-steroidal anti-inflammatories (NSAID): Secondary | ICD-10-CM | POA: Diagnosis not present

## 2022-08-02 DIAGNOSIS — E785 Hyperlipidemia, unspecified: Secondary | ICD-10-CM | POA: Diagnosis not present

## 2022-08-02 DIAGNOSIS — I129 Hypertensive chronic kidney disease with stage 1 through stage 4 chronic kidney disease, or unspecified chronic kidney disease: Secondary | ICD-10-CM | POA: Diagnosis not present

## 2022-08-02 DIAGNOSIS — M459 Ankylosing spondylitis of unspecified sites in spine: Secondary | ICD-10-CM | POA: Diagnosis not present

## 2022-08-02 DIAGNOSIS — K2100 Gastro-esophageal reflux disease with esophagitis, without bleeding: Secondary | ICD-10-CM | POA: Diagnosis not present

## 2022-08-02 DIAGNOSIS — G9529 Other cord compression: Secondary | ICD-10-CM | POA: Diagnosis not present

## 2022-08-02 DIAGNOSIS — M479 Spondylosis, unspecified: Secondary | ICD-10-CM | POA: Diagnosis not present

## 2022-08-02 DIAGNOSIS — Z7984 Long term (current) use of oral hypoglycemic drugs: Secondary | ICD-10-CM | POA: Diagnosis not present

## 2022-08-05 DIAGNOSIS — K2100 Gastro-esophageal reflux disease with esophagitis, without bleeding: Secondary | ICD-10-CM | POA: Diagnosis not present

## 2022-08-05 DIAGNOSIS — E876 Hypokalemia: Secondary | ICD-10-CM | POA: Diagnosis not present

## 2022-08-05 DIAGNOSIS — L89224 Pressure ulcer of left hip, stage 4: Secondary | ICD-10-CM | POA: Diagnosis not present

## 2022-08-05 DIAGNOSIS — M1990 Unspecified osteoarthritis, unspecified site: Secondary | ICD-10-CM | POA: Diagnosis not present

## 2022-08-05 DIAGNOSIS — M479 Spondylosis, unspecified: Secondary | ICD-10-CM | POA: Diagnosis not present

## 2022-08-05 DIAGNOSIS — G8220 Paraplegia, unspecified: Secondary | ICD-10-CM | POA: Diagnosis not present

## 2022-08-05 DIAGNOSIS — E1122 Type 2 diabetes mellitus with diabetic chronic kidney disease: Secondary | ICD-10-CM | POA: Diagnosis not present

## 2022-08-05 DIAGNOSIS — J449 Chronic obstructive pulmonary disease, unspecified: Secondary | ICD-10-CM | POA: Diagnosis not present

## 2022-08-05 DIAGNOSIS — I129 Hypertensive chronic kidney disease with stage 1 through stage 4 chronic kidney disease, or unspecified chronic kidney disease: Secondary | ICD-10-CM | POA: Diagnosis not present

## 2022-08-05 DIAGNOSIS — Z7982 Long term (current) use of aspirin: Secondary | ICD-10-CM | POA: Diagnosis not present

## 2022-08-05 DIAGNOSIS — G9529 Other cord compression: Secondary | ICD-10-CM | POA: Diagnosis not present

## 2022-08-05 DIAGNOSIS — M459 Ankylosing spondylitis of unspecified sites in spine: Secondary | ICD-10-CM | POA: Diagnosis not present

## 2022-08-05 DIAGNOSIS — G8929 Other chronic pain: Secondary | ICD-10-CM | POA: Diagnosis not present

## 2022-08-05 DIAGNOSIS — N182 Chronic kidney disease, stage 2 (mild): Secondary | ICD-10-CM | POA: Diagnosis not present

## 2022-08-05 DIAGNOSIS — E785 Hyperlipidemia, unspecified: Secondary | ICD-10-CM | POA: Diagnosis not present

## 2022-08-05 DIAGNOSIS — Z791 Long term (current) use of non-steroidal anti-inflammatories (NSAID): Secondary | ICD-10-CM | POA: Diagnosis not present

## 2022-08-05 DIAGNOSIS — E1142 Type 2 diabetes mellitus with diabetic polyneuropathy: Secondary | ICD-10-CM | POA: Diagnosis not present

## 2022-08-05 DIAGNOSIS — G319 Degenerative disease of nervous system, unspecified: Secondary | ICD-10-CM | POA: Diagnosis not present

## 2022-08-05 DIAGNOSIS — Z7984 Long term (current) use of oral hypoglycemic drugs: Secondary | ICD-10-CM | POA: Diagnosis not present

## 2022-08-05 DIAGNOSIS — I700 Atherosclerosis of aorta: Secondary | ICD-10-CM | POA: Diagnosis not present

## 2022-08-05 DIAGNOSIS — M4800 Spinal stenosis, site unspecified: Secondary | ICD-10-CM | POA: Diagnosis not present

## 2022-08-06 DIAGNOSIS — N390 Urinary tract infection, site not specified: Secondary | ICD-10-CM | POA: Diagnosis not present

## 2022-08-06 DIAGNOSIS — E119 Type 2 diabetes mellitus without complications: Secondary | ICD-10-CM | POA: Diagnosis not present

## 2022-08-09 DIAGNOSIS — I700 Atherosclerosis of aorta: Secondary | ICD-10-CM | POA: Diagnosis not present

## 2022-08-09 DIAGNOSIS — M459 Ankylosing spondylitis of unspecified sites in spine: Secondary | ICD-10-CM | POA: Diagnosis not present

## 2022-08-09 DIAGNOSIS — E1142 Type 2 diabetes mellitus with diabetic polyneuropathy: Secondary | ICD-10-CM | POA: Diagnosis not present

## 2022-08-09 DIAGNOSIS — M4800 Spinal stenosis, site unspecified: Secondary | ICD-10-CM | POA: Diagnosis not present

## 2022-08-09 DIAGNOSIS — G8220 Paraplegia, unspecified: Secondary | ICD-10-CM | POA: Diagnosis not present

## 2022-08-09 DIAGNOSIS — G319 Degenerative disease of nervous system, unspecified: Secondary | ICD-10-CM | POA: Diagnosis not present

## 2022-08-09 DIAGNOSIS — I129 Hypertensive chronic kidney disease with stage 1 through stage 4 chronic kidney disease, or unspecified chronic kidney disease: Secondary | ICD-10-CM | POA: Diagnosis not present

## 2022-08-09 DIAGNOSIS — E876 Hypokalemia: Secondary | ICD-10-CM | POA: Diagnosis not present

## 2022-08-09 DIAGNOSIS — M1990 Unspecified osteoarthritis, unspecified site: Secondary | ICD-10-CM | POA: Diagnosis not present

## 2022-08-09 DIAGNOSIS — Z7982 Long term (current) use of aspirin: Secondary | ICD-10-CM | POA: Diagnosis not present

## 2022-08-09 DIAGNOSIS — J449 Chronic obstructive pulmonary disease, unspecified: Secondary | ICD-10-CM | POA: Diagnosis not present

## 2022-08-09 DIAGNOSIS — G9529 Other cord compression: Secondary | ICD-10-CM | POA: Diagnosis not present

## 2022-08-09 DIAGNOSIS — E1122 Type 2 diabetes mellitus with diabetic chronic kidney disease: Secondary | ICD-10-CM | POA: Diagnosis not present

## 2022-08-09 DIAGNOSIS — L89224 Pressure ulcer of left hip, stage 4: Secondary | ICD-10-CM | POA: Diagnosis not present

## 2022-08-09 DIAGNOSIS — M479 Spondylosis, unspecified: Secondary | ICD-10-CM | POA: Diagnosis not present

## 2022-08-09 DIAGNOSIS — E785 Hyperlipidemia, unspecified: Secondary | ICD-10-CM | POA: Diagnosis not present

## 2022-08-09 DIAGNOSIS — Z7984 Long term (current) use of oral hypoglycemic drugs: Secondary | ICD-10-CM | POA: Diagnosis not present

## 2022-08-09 DIAGNOSIS — K2100 Gastro-esophageal reflux disease with esophagitis, without bleeding: Secondary | ICD-10-CM | POA: Diagnosis not present

## 2022-08-09 DIAGNOSIS — N182 Chronic kidney disease, stage 2 (mild): Secondary | ICD-10-CM | POA: Diagnosis not present

## 2022-08-09 DIAGNOSIS — G8929 Other chronic pain: Secondary | ICD-10-CM | POA: Diagnosis not present

## 2022-08-09 DIAGNOSIS — Z791 Long term (current) use of non-steroidal anti-inflammatories (NSAID): Secondary | ICD-10-CM | POA: Diagnosis not present

## 2022-08-10 DIAGNOSIS — Z7984 Long term (current) use of oral hypoglycemic drugs: Secondary | ICD-10-CM | POA: Diagnosis not present

## 2022-08-10 DIAGNOSIS — Z981 Arthrodesis status: Secondary | ICD-10-CM | POA: Diagnosis not present

## 2022-08-10 DIAGNOSIS — K2100 Gastro-esophageal reflux disease with esophagitis, without bleeding: Secondary | ICD-10-CM | POA: Diagnosis not present

## 2022-08-10 DIAGNOSIS — Z86711 Personal history of pulmonary embolism: Secondary | ICD-10-CM | POA: Diagnosis not present

## 2022-08-10 DIAGNOSIS — Z886 Allergy status to analgesic agent status: Secondary | ICD-10-CM | POA: Diagnosis not present

## 2022-08-10 DIAGNOSIS — M1991 Primary osteoarthritis, unspecified site: Secondary | ICD-10-CM | POA: Diagnosis not present

## 2022-08-10 DIAGNOSIS — E1142 Type 2 diabetes mellitus with diabetic polyneuropathy: Secondary | ICD-10-CM | POA: Diagnosis not present

## 2022-08-10 DIAGNOSIS — E7800 Pure hypercholesterolemia, unspecified: Secondary | ICD-10-CM | POA: Diagnosis not present

## 2022-08-10 DIAGNOSIS — S71002D Unspecified open wound, left hip, subsequent encounter: Secondary | ICD-10-CM | POA: Diagnosis not present

## 2022-08-10 DIAGNOSIS — Z993 Dependence on wheelchair: Secondary | ICD-10-CM | POA: Diagnosis not present

## 2022-08-10 DIAGNOSIS — I10 Essential (primary) hypertension: Secondary | ICD-10-CM | POA: Diagnosis not present

## 2022-08-10 DIAGNOSIS — L89229 Pressure ulcer of left hip, unspecified stage: Secondary | ICD-10-CM | POA: Diagnosis not present

## 2022-08-10 DIAGNOSIS — Z885 Allergy status to narcotic agent status: Secondary | ICD-10-CM | POA: Diagnosis not present

## 2022-08-10 DIAGNOSIS — Z791 Long term (current) use of non-steroidal anti-inflammatories (NSAID): Secondary | ICD-10-CM | POA: Diagnosis not present

## 2022-08-10 DIAGNOSIS — J449 Chronic obstructive pulmonary disease, unspecified: Secondary | ICD-10-CM | POA: Diagnosis not present

## 2022-08-10 DIAGNOSIS — Z87891 Personal history of nicotine dependence: Secondary | ICD-10-CM | POA: Diagnosis not present

## 2022-08-10 DIAGNOSIS — S71102D Unspecified open wound, left thigh, subsequent encounter: Secondary | ICD-10-CM | POA: Diagnosis not present

## 2022-08-10 DIAGNOSIS — Z7982 Long term (current) use of aspirin: Secondary | ICD-10-CM | POA: Diagnosis not present

## 2022-08-10 DIAGNOSIS — Z96641 Presence of right artificial hip joint: Secondary | ICD-10-CM | POA: Diagnosis not present

## 2022-08-10 DIAGNOSIS — M4716 Other spondylosis with myelopathy, lumbar region: Secondary | ICD-10-CM | POA: Diagnosis not present

## 2022-08-10 DIAGNOSIS — Z79899 Other long term (current) drug therapy: Secondary | ICD-10-CM | POA: Diagnosis not present

## 2022-08-10 DIAGNOSIS — Z66 Do not resuscitate: Secondary | ICD-10-CM | POA: Diagnosis not present

## 2022-08-12 DIAGNOSIS — E876 Hypokalemia: Secondary | ICD-10-CM | POA: Diagnosis not present

## 2022-08-12 DIAGNOSIS — Z7982 Long term (current) use of aspirin: Secondary | ICD-10-CM | POA: Diagnosis not present

## 2022-08-12 DIAGNOSIS — E785 Hyperlipidemia, unspecified: Secondary | ICD-10-CM | POA: Diagnosis not present

## 2022-08-12 DIAGNOSIS — N182 Chronic kidney disease, stage 2 (mild): Secondary | ICD-10-CM | POA: Diagnosis not present

## 2022-08-12 DIAGNOSIS — G9529 Other cord compression: Secondary | ICD-10-CM | POA: Diagnosis not present

## 2022-08-12 DIAGNOSIS — M459 Ankylosing spondylitis of unspecified sites in spine: Secondary | ICD-10-CM | POA: Diagnosis not present

## 2022-08-12 DIAGNOSIS — K2100 Gastro-esophageal reflux disease with esophagitis, without bleeding: Secondary | ICD-10-CM | POA: Diagnosis not present

## 2022-08-12 DIAGNOSIS — E1122 Type 2 diabetes mellitus with diabetic chronic kidney disease: Secondary | ICD-10-CM | POA: Diagnosis not present

## 2022-08-12 DIAGNOSIS — I129 Hypertensive chronic kidney disease with stage 1 through stage 4 chronic kidney disease, or unspecified chronic kidney disease: Secondary | ICD-10-CM | POA: Diagnosis not present

## 2022-08-12 DIAGNOSIS — M1990 Unspecified osteoarthritis, unspecified site: Secondary | ICD-10-CM | POA: Diagnosis not present

## 2022-08-12 DIAGNOSIS — G319 Degenerative disease of nervous system, unspecified: Secondary | ICD-10-CM | POA: Diagnosis not present

## 2022-08-12 DIAGNOSIS — J449 Chronic obstructive pulmonary disease, unspecified: Secondary | ICD-10-CM | POA: Diagnosis not present

## 2022-08-12 DIAGNOSIS — I700 Atherosclerosis of aorta: Secondary | ICD-10-CM | POA: Diagnosis not present

## 2022-08-12 DIAGNOSIS — Z791 Long term (current) use of non-steroidal anti-inflammatories (NSAID): Secondary | ICD-10-CM | POA: Diagnosis not present

## 2022-08-12 DIAGNOSIS — L89224 Pressure ulcer of left hip, stage 4: Secondary | ICD-10-CM | POA: Diagnosis not present

## 2022-08-12 DIAGNOSIS — G8220 Paraplegia, unspecified: Secondary | ICD-10-CM | POA: Diagnosis not present

## 2022-08-12 DIAGNOSIS — M479 Spondylosis, unspecified: Secondary | ICD-10-CM | POA: Diagnosis not present

## 2022-08-12 DIAGNOSIS — Z7984 Long term (current) use of oral hypoglycemic drugs: Secondary | ICD-10-CM | POA: Diagnosis not present

## 2022-08-12 DIAGNOSIS — E1142 Type 2 diabetes mellitus with diabetic polyneuropathy: Secondary | ICD-10-CM | POA: Diagnosis not present

## 2022-08-12 DIAGNOSIS — M4800 Spinal stenosis, site unspecified: Secondary | ICD-10-CM | POA: Diagnosis not present

## 2022-08-12 DIAGNOSIS — G8929 Other chronic pain: Secondary | ICD-10-CM | POA: Diagnosis not present

## 2022-08-15 DIAGNOSIS — G9520 Unspecified cord compression: Secondary | ICD-10-CM | POA: Diagnosis not present

## 2022-08-15 DIAGNOSIS — Z7984 Long term (current) use of oral hypoglycemic drugs: Secondary | ICD-10-CM | POA: Diagnosis not present

## 2022-08-15 DIAGNOSIS — R29898 Other symptoms and signs involving the musculoskeletal system: Secondary | ICD-10-CM | POA: Diagnosis not present

## 2022-08-15 DIAGNOSIS — Z7982 Long term (current) use of aspirin: Secondary | ICD-10-CM | POA: Diagnosis not present

## 2022-08-15 DIAGNOSIS — M479 Spondylosis, unspecified: Secondary | ICD-10-CM | POA: Diagnosis not present

## 2022-08-15 DIAGNOSIS — L89224 Pressure ulcer of left hip, stage 4: Secondary | ICD-10-CM | POA: Diagnosis not present

## 2022-08-15 DIAGNOSIS — G319 Degenerative disease of nervous system, unspecified: Secondary | ICD-10-CM | POA: Diagnosis not present

## 2022-08-15 DIAGNOSIS — J449 Chronic obstructive pulmonary disease, unspecified: Secondary | ICD-10-CM | POA: Diagnosis not present

## 2022-08-15 DIAGNOSIS — K2100 Gastro-esophageal reflux disease with esophagitis, without bleeding: Secondary | ICD-10-CM | POA: Diagnosis not present

## 2022-08-15 DIAGNOSIS — G9529 Other cord compression: Secondary | ICD-10-CM | POA: Diagnosis not present

## 2022-08-15 DIAGNOSIS — I129 Hypertensive chronic kidney disease with stage 1 through stage 4 chronic kidney disease, or unspecified chronic kidney disease: Secondary | ICD-10-CM | POA: Diagnosis not present

## 2022-08-15 DIAGNOSIS — G8929 Other chronic pain: Secondary | ICD-10-CM | POA: Diagnosis not present

## 2022-08-15 DIAGNOSIS — Z791 Long term (current) use of non-steroidal anti-inflammatories (NSAID): Secondary | ICD-10-CM | POA: Diagnosis not present

## 2022-08-15 DIAGNOSIS — G8220 Paraplegia, unspecified: Secondary | ICD-10-CM | POA: Diagnosis not present

## 2022-08-15 DIAGNOSIS — E1142 Type 2 diabetes mellitus with diabetic polyneuropathy: Secondary | ICD-10-CM | POA: Diagnosis not present

## 2022-08-15 DIAGNOSIS — I700 Atherosclerosis of aorta: Secondary | ICD-10-CM | POA: Diagnosis not present

## 2022-08-15 DIAGNOSIS — M459 Ankylosing spondylitis of unspecified sites in spine: Secondary | ICD-10-CM | POA: Diagnosis not present

## 2022-08-15 DIAGNOSIS — E1122 Type 2 diabetes mellitus with diabetic chronic kidney disease: Secondary | ICD-10-CM | POA: Diagnosis not present

## 2022-08-15 DIAGNOSIS — M1990 Unspecified osteoarthritis, unspecified site: Secondary | ICD-10-CM | POA: Diagnosis not present

## 2022-08-15 DIAGNOSIS — E876 Hypokalemia: Secondary | ICD-10-CM | POA: Diagnosis not present

## 2022-08-15 DIAGNOSIS — M4800 Spinal stenosis, site unspecified: Secondary | ICD-10-CM | POA: Diagnosis not present

## 2022-08-15 DIAGNOSIS — E785 Hyperlipidemia, unspecified: Secondary | ICD-10-CM | POA: Diagnosis not present

## 2022-08-15 DIAGNOSIS — N182 Chronic kidney disease, stage 2 (mild): Secondary | ICD-10-CM | POA: Diagnosis not present

## 2022-08-15 DIAGNOSIS — M4714 Other spondylosis with myelopathy, thoracic region: Secondary | ICD-10-CM | POA: Diagnosis not present

## 2022-08-18 DIAGNOSIS — M4716 Other spondylosis with myelopathy, lumbar region: Secondary | ICD-10-CM | POA: Diagnosis not present

## 2022-08-18 DIAGNOSIS — J449 Chronic obstructive pulmonary disease, unspecified: Secondary | ICD-10-CM | POA: Diagnosis not present

## 2022-08-19 DIAGNOSIS — E785 Hyperlipidemia, unspecified: Secondary | ICD-10-CM | POA: Diagnosis not present

## 2022-08-19 DIAGNOSIS — Z7982 Long term (current) use of aspirin: Secondary | ICD-10-CM | POA: Diagnosis not present

## 2022-08-19 DIAGNOSIS — Z7984 Long term (current) use of oral hypoglycemic drugs: Secondary | ICD-10-CM | POA: Diagnosis not present

## 2022-08-19 DIAGNOSIS — J449 Chronic obstructive pulmonary disease, unspecified: Secondary | ICD-10-CM | POA: Diagnosis not present

## 2022-08-19 DIAGNOSIS — M4800 Spinal stenosis, site unspecified: Secondary | ICD-10-CM | POA: Diagnosis not present

## 2022-08-19 DIAGNOSIS — Z791 Long term (current) use of non-steroidal anti-inflammatories (NSAID): Secondary | ICD-10-CM | POA: Diagnosis not present

## 2022-08-19 DIAGNOSIS — N182 Chronic kidney disease, stage 2 (mild): Secondary | ICD-10-CM | POA: Diagnosis not present

## 2022-08-19 DIAGNOSIS — I129 Hypertensive chronic kidney disease with stage 1 through stage 4 chronic kidney disease, or unspecified chronic kidney disease: Secondary | ICD-10-CM | POA: Diagnosis not present

## 2022-08-19 DIAGNOSIS — I700 Atherosclerosis of aorta: Secondary | ICD-10-CM | POA: Diagnosis not present

## 2022-08-19 DIAGNOSIS — E1142 Type 2 diabetes mellitus with diabetic polyneuropathy: Secondary | ICD-10-CM | POA: Diagnosis not present

## 2022-08-19 DIAGNOSIS — M459 Ankylosing spondylitis of unspecified sites in spine: Secondary | ICD-10-CM | POA: Diagnosis not present

## 2022-08-19 DIAGNOSIS — M479 Spondylosis, unspecified: Secondary | ICD-10-CM | POA: Diagnosis not present

## 2022-08-19 DIAGNOSIS — L89224 Pressure ulcer of left hip, stage 4: Secondary | ICD-10-CM | POA: Diagnosis not present

## 2022-08-19 DIAGNOSIS — M1990 Unspecified osteoarthritis, unspecified site: Secondary | ICD-10-CM | POA: Diagnosis not present

## 2022-08-19 DIAGNOSIS — K2100 Gastro-esophageal reflux disease with esophagitis, without bleeding: Secondary | ICD-10-CM | POA: Diagnosis not present

## 2022-08-19 DIAGNOSIS — E876 Hypokalemia: Secondary | ICD-10-CM | POA: Diagnosis not present

## 2022-08-19 DIAGNOSIS — G8220 Paraplegia, unspecified: Secondary | ICD-10-CM | POA: Diagnosis not present

## 2022-08-19 DIAGNOSIS — G9529 Other cord compression: Secondary | ICD-10-CM | POA: Diagnosis not present

## 2022-08-19 DIAGNOSIS — G319 Degenerative disease of nervous system, unspecified: Secondary | ICD-10-CM | POA: Diagnosis not present

## 2022-08-19 DIAGNOSIS — G8929 Other chronic pain: Secondary | ICD-10-CM | POA: Diagnosis not present

## 2022-08-19 DIAGNOSIS — E1122 Type 2 diabetes mellitus with diabetic chronic kidney disease: Secondary | ICD-10-CM | POA: Diagnosis not present

## 2022-08-23 DIAGNOSIS — Z7984 Long term (current) use of oral hypoglycemic drugs: Secondary | ICD-10-CM | POA: Diagnosis not present

## 2022-08-23 DIAGNOSIS — E1142 Type 2 diabetes mellitus with diabetic polyneuropathy: Secondary | ICD-10-CM | POA: Diagnosis not present

## 2022-08-23 DIAGNOSIS — E785 Hyperlipidemia, unspecified: Secondary | ICD-10-CM | POA: Diagnosis not present

## 2022-08-23 DIAGNOSIS — M479 Spondylosis, unspecified: Secondary | ICD-10-CM | POA: Diagnosis not present

## 2022-08-23 DIAGNOSIS — G319 Degenerative disease of nervous system, unspecified: Secondary | ICD-10-CM | POA: Diagnosis not present

## 2022-08-23 DIAGNOSIS — E876 Hypokalemia: Secondary | ICD-10-CM | POA: Diagnosis not present

## 2022-08-23 DIAGNOSIS — G8220 Paraplegia, unspecified: Secondary | ICD-10-CM | POA: Diagnosis not present

## 2022-08-23 DIAGNOSIS — I700 Atherosclerosis of aorta: Secondary | ICD-10-CM | POA: Diagnosis not present

## 2022-08-23 DIAGNOSIS — Z7982 Long term (current) use of aspirin: Secondary | ICD-10-CM | POA: Diagnosis not present

## 2022-08-23 DIAGNOSIS — N182 Chronic kidney disease, stage 2 (mild): Secondary | ICD-10-CM | POA: Diagnosis not present

## 2022-08-23 DIAGNOSIS — M4800 Spinal stenosis, site unspecified: Secondary | ICD-10-CM | POA: Diagnosis not present

## 2022-08-23 DIAGNOSIS — K2100 Gastro-esophageal reflux disease with esophagitis, without bleeding: Secondary | ICD-10-CM | POA: Diagnosis not present

## 2022-08-23 DIAGNOSIS — M459 Ankylosing spondylitis of unspecified sites in spine: Secondary | ICD-10-CM | POA: Diagnosis not present

## 2022-08-23 DIAGNOSIS — Z791 Long term (current) use of non-steroidal anti-inflammatories (NSAID): Secondary | ICD-10-CM | POA: Diagnosis not present

## 2022-08-23 DIAGNOSIS — L89224 Pressure ulcer of left hip, stage 4: Secondary | ICD-10-CM | POA: Diagnosis not present

## 2022-08-23 DIAGNOSIS — G9529 Other cord compression: Secondary | ICD-10-CM | POA: Diagnosis not present

## 2022-08-23 DIAGNOSIS — M1990 Unspecified osteoarthritis, unspecified site: Secondary | ICD-10-CM | POA: Diagnosis not present

## 2022-08-23 DIAGNOSIS — I129 Hypertensive chronic kidney disease with stage 1 through stage 4 chronic kidney disease, or unspecified chronic kidney disease: Secondary | ICD-10-CM | POA: Diagnosis not present

## 2022-08-23 DIAGNOSIS — J449 Chronic obstructive pulmonary disease, unspecified: Secondary | ICD-10-CM | POA: Diagnosis not present

## 2022-08-23 DIAGNOSIS — E1122 Type 2 diabetes mellitus with diabetic chronic kidney disease: Secondary | ICD-10-CM | POA: Diagnosis not present

## 2022-08-23 DIAGNOSIS — G8929 Other chronic pain: Secondary | ICD-10-CM | POA: Diagnosis not present

## 2022-08-24 DIAGNOSIS — L89224 Pressure ulcer of left hip, stage 4: Secondary | ICD-10-CM | POA: Diagnosis not present

## 2022-08-24 DIAGNOSIS — L89324 Pressure ulcer of left buttock, stage 4: Secondary | ICD-10-CM | POA: Diagnosis not present

## 2022-08-31 DIAGNOSIS — Z7984 Long term (current) use of oral hypoglycemic drugs: Secondary | ICD-10-CM | POA: Diagnosis not present

## 2022-08-31 DIAGNOSIS — M1990 Unspecified osteoarthritis, unspecified site: Secondary | ICD-10-CM | POA: Diagnosis not present

## 2022-08-31 DIAGNOSIS — M459 Ankylosing spondylitis of unspecified sites in spine: Secondary | ICD-10-CM | POA: Diagnosis not present

## 2022-08-31 DIAGNOSIS — E1142 Type 2 diabetes mellitus with diabetic polyneuropathy: Secondary | ICD-10-CM | POA: Diagnosis not present

## 2022-08-31 DIAGNOSIS — N182 Chronic kidney disease, stage 2 (mild): Secondary | ICD-10-CM | POA: Diagnosis not present

## 2022-08-31 DIAGNOSIS — G8929 Other chronic pain: Secondary | ICD-10-CM | POA: Diagnosis not present

## 2022-08-31 DIAGNOSIS — K2100 Gastro-esophageal reflux disease with esophagitis, without bleeding: Secondary | ICD-10-CM | POA: Diagnosis not present

## 2022-08-31 DIAGNOSIS — I700 Atherosclerosis of aorta: Secondary | ICD-10-CM | POA: Diagnosis not present

## 2022-08-31 DIAGNOSIS — G9529 Other cord compression: Secondary | ICD-10-CM | POA: Diagnosis not present

## 2022-08-31 DIAGNOSIS — L89224 Pressure ulcer of left hip, stage 4: Secondary | ICD-10-CM | POA: Diagnosis not present

## 2022-08-31 DIAGNOSIS — Z7982 Long term (current) use of aspirin: Secondary | ICD-10-CM | POA: Diagnosis not present

## 2022-08-31 DIAGNOSIS — E785 Hyperlipidemia, unspecified: Secondary | ICD-10-CM | POA: Diagnosis not present

## 2022-08-31 DIAGNOSIS — M4800 Spinal stenosis, site unspecified: Secondary | ICD-10-CM | POA: Diagnosis not present

## 2022-08-31 DIAGNOSIS — E876 Hypokalemia: Secondary | ICD-10-CM | POA: Diagnosis not present

## 2022-08-31 DIAGNOSIS — J449 Chronic obstructive pulmonary disease, unspecified: Secondary | ICD-10-CM | POA: Diagnosis not present

## 2022-08-31 DIAGNOSIS — E1122 Type 2 diabetes mellitus with diabetic chronic kidney disease: Secondary | ICD-10-CM | POA: Diagnosis not present

## 2022-08-31 DIAGNOSIS — G319 Degenerative disease of nervous system, unspecified: Secondary | ICD-10-CM | POA: Diagnosis not present

## 2022-08-31 DIAGNOSIS — M479 Spondylosis, unspecified: Secondary | ICD-10-CM | POA: Diagnosis not present

## 2022-08-31 DIAGNOSIS — Z791 Long term (current) use of non-steroidal anti-inflammatories (NSAID): Secondary | ICD-10-CM | POA: Diagnosis not present

## 2022-08-31 DIAGNOSIS — G8220 Paraplegia, unspecified: Secondary | ICD-10-CM | POA: Diagnosis not present

## 2022-08-31 DIAGNOSIS — I129 Hypertensive chronic kidney disease with stage 1 through stage 4 chronic kidney disease, or unspecified chronic kidney disease: Secondary | ICD-10-CM | POA: Diagnosis not present

## 2022-09-07 ENCOUNTER — Other Ambulatory Visit: Payer: Self-pay

## 2022-09-07 ENCOUNTER — Emergency Department (HOSPITAL_COMMUNITY)
Admission: EM | Admit: 2022-09-07 | Discharge: 2022-09-08 | Disposition: A | Discharge status: Home / Self Care | Payer: Medicare Other | Attending: Emergency Medicine | Admitting: Emergency Medicine

## 2022-09-07 ENCOUNTER — Emergency Department (HOSPITAL_COMMUNITY): Payer: Medicare Other

## 2022-09-07 ENCOUNTER — Encounter (HOSPITAL_COMMUNITY): Payer: Self-pay

## 2022-09-07 DIAGNOSIS — Z885 Allergy status to narcotic agent status: Secondary | ICD-10-CM | POA: Diagnosis not present

## 2022-09-07 DIAGNOSIS — R109 Unspecified abdominal pain: Secondary | ICD-10-CM | POA: Diagnosis present

## 2022-09-07 DIAGNOSIS — G8220 Paraplegia, unspecified: Secondary | ICD-10-CM | POA: Diagnosis not present

## 2022-09-07 DIAGNOSIS — J449 Chronic obstructive pulmonary disease, unspecified: Secondary | ICD-10-CM | POA: Insufficient documentation

## 2022-09-07 DIAGNOSIS — S31829A Unspecified open wound of left buttock, initial encounter: Secondary | ICD-10-CM | POA: Diagnosis not present

## 2022-09-07 DIAGNOSIS — K2100 Gastro-esophageal reflux disease with esophagitis, without bleeding: Secondary | ICD-10-CM | POA: Diagnosis not present

## 2022-09-07 DIAGNOSIS — Z79899 Other long term (current) drug therapy: Secondary | ICD-10-CM | POA: Insufficient documentation

## 2022-09-07 DIAGNOSIS — D72829 Elevated white blood cell count, unspecified: Secondary | ICD-10-CM | POA: Insufficient documentation

## 2022-09-07 DIAGNOSIS — M1990 Unspecified osteoarthritis, unspecified site: Secondary | ICD-10-CM | POA: Diagnosis not present

## 2022-09-07 DIAGNOSIS — E785 Hyperlipidemia, unspecified: Secondary | ICD-10-CM | POA: Diagnosis not present

## 2022-09-07 DIAGNOSIS — R7989 Other specified abnormal findings of blood chemistry: Secondary | ICD-10-CM | POA: Diagnosis not present

## 2022-09-07 DIAGNOSIS — Z87891 Personal history of nicotine dependence: Secondary | ICD-10-CM | POA: Diagnosis not present

## 2022-09-07 DIAGNOSIS — I959 Hypotension, unspecified: Secondary | ICD-10-CM | POA: Diagnosis not present

## 2022-09-07 DIAGNOSIS — Z794 Long term (current) use of insulin: Secondary | ICD-10-CM | POA: Diagnosis not present

## 2022-09-07 DIAGNOSIS — Z20822 Contact with and (suspected) exposure to covid-19: Secondary | ICD-10-CM | POA: Insufficient documentation

## 2022-09-07 DIAGNOSIS — E119 Type 2 diabetes mellitus without complications: Secondary | ICD-10-CM | POA: Insufficient documentation

## 2022-09-07 DIAGNOSIS — R531 Weakness: Secondary | ICD-10-CM | POA: Diagnosis not present

## 2022-09-07 DIAGNOSIS — N3000 Acute cystitis without hematuria: Secondary | ICD-10-CM | POA: Insufficient documentation

## 2022-09-07 DIAGNOSIS — E7800 Pure hypercholesterolemia, unspecified: Secondary | ICD-10-CM | POA: Diagnosis not present

## 2022-09-07 DIAGNOSIS — Z7984 Long term (current) use of oral hypoglycemic drugs: Secondary | ICD-10-CM | POA: Diagnosis not present

## 2022-09-07 DIAGNOSIS — S71002S Unspecified open wound, left hip, sequela: Secondary | ICD-10-CM | POA: Diagnosis not present

## 2022-09-07 DIAGNOSIS — Z96641 Presence of right artificial hip joint: Secondary | ICD-10-CM | POA: Diagnosis not present

## 2022-09-07 DIAGNOSIS — Z7982 Long term (current) use of aspirin: Secondary | ICD-10-CM | POA: Insufficient documentation

## 2022-09-07 DIAGNOSIS — Z1152 Encounter for screening for COVID-19: Secondary | ICD-10-CM | POA: Insufficient documentation

## 2022-09-07 DIAGNOSIS — S71102S Unspecified open wound, left thigh, sequela: Secondary | ICD-10-CM | POA: Diagnosis not present

## 2022-09-07 DIAGNOSIS — Z01818 Encounter for other preprocedural examination: Secondary | ICD-10-CM | POA: Diagnosis not present

## 2022-09-07 DIAGNOSIS — Z86711 Personal history of pulmonary embolism: Secondary | ICD-10-CM | POA: Diagnosis not present

## 2022-09-07 DIAGNOSIS — L89229 Pressure ulcer of left hip, unspecified stage: Secondary | ICD-10-CM | POA: Diagnosis not present

## 2022-09-07 DIAGNOSIS — Z66 Do not resuscitate: Secondary | ICD-10-CM | POA: Diagnosis not present

## 2022-09-07 DIAGNOSIS — I10 Essential (primary) hypertension: Secondary | ICD-10-CM | POA: Insufficient documentation

## 2022-09-07 DIAGNOSIS — E1142 Type 2 diabetes mellitus with diabetic polyneuropathy: Secondary | ICD-10-CM | POA: Diagnosis not present

## 2022-09-07 DIAGNOSIS — H2512 Age-related nuclear cataract, left eye: Secondary | ICD-10-CM | POA: Diagnosis not present

## 2022-09-07 DIAGNOSIS — J811 Chronic pulmonary edema: Secondary | ICD-10-CM | POA: Diagnosis not present

## 2022-09-07 DIAGNOSIS — Z981 Arthrodesis status: Secondary | ICD-10-CM | POA: Diagnosis not present

## 2022-09-07 LAB — LACTIC ACID, PLASMA
Lactic Acid, Venous: 2.4 mmol/L (ref 0.5–1.9)
Lactic Acid, Venous: 1.6 mmol/L (ref 0.5–1.9)

## 2022-09-07 LAB — CBC WITH DIFFERENTIAL/PLATELET
Abs Immature Granulocytes: 0.06 10*3/uL (ref 0.00–0.07)
Basophils Absolute: 0.1 10*3/uL (ref 0.0–0.1)
Basophils Relative: 1 %
Eosinophils Absolute: 0.8 10*3/uL — ABNORMAL HIGH (ref 0.0–0.5)
Eosinophils Relative: 5 %
HCT: 39.9 % (ref 39.0–52.0)
Hemoglobin: 12.8 g/dL — ABNORMAL LOW (ref 13.0–17.0)
Immature Granulocytes: 0 %
Lymphocytes Relative: 16 %
Lymphs Abs: 2.5 10*3/uL (ref 0.7–4.0)
MCH: 26.3 pg (ref 26.0–34.0)
MCHC: 32.1 g/dL (ref 30.0–36.0)
MCV: 81.9 fL (ref 80.0–100.0)
Monocytes Absolute: 1.3 10*3/uL — ABNORMAL HIGH (ref 0.1–1.0)
Monocytes Relative: 8 %
Neutro Abs: 10.7 10*3/uL — ABNORMAL HIGH (ref 1.7–7.7)
Neutrophils Relative %: 70 %
Platelets: 272 10*3/uL (ref 150–400)
RBC: 4.87 MIL/uL (ref 4.22–5.81)
RDW: 15.5 % (ref 11.5–15.5)
WBC: 15.3 10*3/uL — ABNORMAL HIGH (ref 4.0–10.5)
nRBC: 0 % (ref 0.0–0.2)

## 2022-09-07 LAB — PROTIME-INR
INR: 1 (ref 0.8–1.2)
Prothrombin Time: 12.9 seconds (ref 11.4–15.2)

## 2022-09-07 LAB — BASIC METABOLIC PANEL
Anion gap: 12 (ref 5–15)
BUN: 49 mg/dL — ABNORMAL HIGH (ref 8–23)
CO2: 24 mmol/L (ref 22–32)
Calcium: 9.1 mg/dL (ref 8.9–10.3)
Chloride: 98 mmol/L (ref 98–111)
Creatinine, Ser: 1.4 mg/dL — ABNORMAL HIGH (ref 0.61–1.24)
GFR, Estimated: 54 mL/min — ABNORMAL LOW (ref >60)
Glucose, Bld: 139 mg/dL — ABNORMAL HIGH (ref 70–99)
Potassium: 3.4 mmol/L — ABNORMAL LOW (ref 3.5–5.1)
Sodium: 134 mmol/L — ABNORMAL LOW (ref 135–145)

## 2022-09-07 LAB — RESP PANEL BY RT-PCR (RSV, FLU A&B, COVID)  RVPGX2
Influenza A by PCR: NEGATIVE
Influenza B by PCR: NEGATIVE
Resp Syncytial Virus by PCR: NEGATIVE
SARS Coronavirus 2 by RT PCR: NEGATIVE

## 2022-09-07 LAB — APTT: aPTT: 30 seconds (ref 24–36)

## 2022-09-07 MED ORDER — LACTATED RINGERS IV BOLUS (SEPSIS)
1000.0000 mL | Freq: Once | INTRAVENOUS | Status: AC
  Administered 2022-09-07: 1000 mL via INTRAVENOUS

## 2022-09-07 NOTE — ED Provider Notes (Signed)
[facility] DEPT Provider Note   CSN: [id number] Arrival date & time: 09/07/22  1741     History  Chief Complaint  Patient presents with   Hypotension    Patrick Aguilar is a 71 y.o. male who presents with his cousin at the bedside with concern for report of low blood pressure when he was at his doctor's appointment earlier today.  States he has been feeling well, no concerns.  Patient has history of spinal cord injury, near complete loss of sensation and motor function in his lower legs.  States he wears a condom catheter at home, lives alone, has a woman who comes to help him with cooking and bathing a couple of times a day.  Patient was seen by wound care this morning for left buttock decubitus ulcer, no evidence of infection at that visit.  He denies fevers or chills, denies abdominal pain nausea vomiting diarrhea.  I personally reviewed his medical history.  History of hypertension, COPD, type 2 diabetes, diastolic dysfunction. Hx of fournier's gangrene, status post bilateral orchiectomy. No anticoagulation.  HPI     Home Medications Prior to Admission medications   Medication Sig Start Date End Date Taking? Authorizing Provider  nitrofurantoin, macrocrystal-monohydrate, (MACROBID) 100 MG capsule Take 1 capsule (100 mg total) by mouth 2 (two) times daily for 7 days. 09/08/22 09/15/22 Yes Camiyah Friberg R, PA-C  albuterol (PROVENTIL) (2.5 MG/3ML) 0.083% nebulizer solution Inhale 2.5 mg into the lungs every 6 (six) hours as needed for shortness of breath or wheezing. 12/06/14   [provider]  albuterol (VENTOLIN HFA) 108 (90 Base) MCG/ACT inhaler Inhale 2 puffs into the lungs every 6 (six) hours as needed for wheezing or shortness of breath.    [provider]  aspirin 81 MG EC tablet Take by mouth. 06/02/21   [provider]  atenolol-chlorthalidone (TENORETIC) 50-25 MG tablet Take 1 tablet by mouth daily with breakfast.   02/09/15   [provider]  blood glucose meter kit and supplies KIT Dispense based on patient and insurance preference. Use up to four times daily as directed. (FOR ICD-9 250.00, 250.01). 12/28/17   Florencia Reasons, MD  celecoxib (CELEBREX) 200 MG capsule Take 1 capsule by mouth daily.    [provider]  doxazosin (CARDURA XL) 8 MG 24 hr tablet Take by mouth.    [provider]  famotidine (PEPCID) 20 MG tablet Take 20 mg by mouth 2 (two) times daily. 05/31/19   [provider]  furosemide (LASIX) 20 MG tablet Take 20 mg by mouth daily after breakfast.     [provider]  gabapentin (NEURONTIN) 800 MG tablet Take 800 mg by mouth every 6 (six) hours.  02/09/15   [provider]  glipiZIDE (GLUCOTROL) 5 MG tablet Take 1 tablet (5 mg total) by mouth 2 (two) times daily. 05/17/22   Cassandria Anger, MD  insulin glargine (LANTUS) 100 UNIT/ML Solostar Pen Inject 30 Units into the skin at bedtime. 06/17/22   Cassandria Anger, MD  Insulin Pen Needle (BD PEN NEEDLE NANO U/F) 32G X 4 MM MISC 1 each by Does not apply route 4 (four) times daily. 06/09/22   Cassandria Anger, MD  metFORMIN (GLUCOPHAGE) 500 MG tablet Take 500 mg by mouth once.    [provider]  oxyCODONE-acetaminophen (PERCOCET) 10-325 MG tablet Take 1 tablet by mouth every 6 (six) hours. 08/12/19   [provider]  OXYGEN Inhale 1-3 L into the  lungs.    [provider]  pantoprazole (PROTONIX) 40 MG tablet Take 40 mg by mouth daily.    [provider]  potassium chloride (KLOR-CON) 20 MEQ packet Take by mouth.    [provider]  rosuvastatin (CRESTOR) 5 MG tablet Take 5 mg by mouth every Monday. 07/11/19   [provider]  traZODone (DESYREL) 100 MG tablet Take 100 mg by mouth at bedtime. 11/29/17   [provider]      Allergies    Tramadol, Adenosine, Bupropion, and Morphine and related    Review of Systems   Review of  Systems  Constitutional: Negative.   HENT: Negative.    Respiratory: Negative.    Cardiovascular: Negative.   Gastrointestinal: Negative.   Genitourinary:  Positive for decreased urine volume.       Patient with minimal sensation in the groin secondary to spinal cord injury    Physical Exam Updated Vital Signs BP 122/68   Pulse 69   Temp 97.9 F (36.6 C) (Oral)   Resp 16   Ht _0  (1.676 m)   Wt 83 kg   SpO2 95%   BMI 29.53 kg/m  Physical Exam Vitals and nursing note reviewed. Exam conducted with a chaperone present [employer] cabin crew).  Constitutional:      Appearance: He is not ill-appearing or toxic-appearing.  HENT:     Head: Normocephalic and atraumatic.     Nose: Nose normal.     Mouth/Throat:     Mouth: Mucous membranes are moist.     Pharynx: No oropharyngeal exudate or posterior oropharyngeal erythema.  Eyes:     General:        Right eye: No discharge.        Left eye: No discharge.     Extraocular Movements: Extraocular movements intact.     Conjunctiva/sclera: Conjunctivae normal.     Pupils: Pupils are equal, round, and reactive to light.  Cardiovascular:     Rate and Rhythm: Normal rate and regular rhythm.     Pulses: Normal pulses.     Heart sounds: Normal heart sounds. No murmur heard. Pulmonary:     Effort: Pulmonary effort is normal. No tachypnea, bradypnea, accessory muscle usage, prolonged expiration or respiratory distress.     Breath sounds: Examination of the right-lower field reveals rales. Examination of the left-lower field reveals rales. Rales present. No wheezing.  Abdominal:     General: Bowel sounds are normal. There is no distension.     Palpations: Abdomen is soft.     Tenderness: There is no abdominal tenderness. There is no guarding or rebound.  Genitourinary:    Penis: Normal. No erythema, tenderness or swelling.      Comments: Testes absent Musculoskeletal:        General: No deformity.     Cervical back: Neck supple.     Right  lower leg: No edema.     Left lower leg: No edema.       Legs:  Skin:    General: Skin is warm and dry.     Capillary Refill: Capillary refill takes less than 2 seconds.  Neurological:     General: No focal deficit present.     Mental Status: He is alert and oriented to person, place, and time. Mental status is at baseline.  Psychiatric:        Mood and Affect: Mood normal.     ED Results / Procedures / Treatments   Labs (all labs  ordered are listed, but only abnormal results are displayed) Labs Reviewed  CBC WITH DIFFERENTIAL/PLATELET - Abnormal; Notable for the following components:      Result Value   WBC 15.3 (*)    Hemoglobin 12.8 (*)    Neutro Abs 10.7 (*)    Monocytes Absolute 1.3 (*)    Eosinophils Absolute 0.8 (*)    All other components within normal limits  BASIC METABOLIC PANEL - Abnormal; Notable for the following components:   Sodium 134 (*)    Potassium 3.4 (*)    Glucose, Bld 139 (*)    BUN 49 (*)    Creatinine, Ser 1.40 (*)    GFR, Estimated 54 (*)    All other components within normal limits  LACTIC ACID, PLASMA - Abnormal; Notable for the following components:   Lactic Acid, Venous 2.4 (*)    All other components within normal limits  URINALYSIS, ROUTINE W REFLEX MICROSCOPIC - Abnormal; Notable for the following components:   APPearance CLOUDY (*)    Protein, ur 30 (*)    Leukocytes,Ua LARGE (*)    Bacteria, UA MANY (*)    All other components within normal limits  RESP PANEL BY RT-PCR (RSV, FLU A&B, COVID)  RVPGX2  URINE CULTURE  CULTURE, BLOOD (ROUTINE X 2)  CULTURE, BLOOD (ROUTINE X 2)  LACTIC ACID, PLASMA  PROTIME-INR  APTT  HEPATIC FUNCTION PANEL    EKG None  Radiology DG HIP UNILAT WITH PELVIS 2-3 VIEWS LEFT  Result Date: 09/08/2022 CLINICAL DATA:  Wound to left buttock EXAM: DG HIP (WITH OR WITHOUT PELVIS) 2-3V LEFT COMPARISON:  CT pelvis 04/08/2019 FINDINGS: Soft tissue irregularity over the lateral left gluteal subcutaneous  tissue. No acute fracture or dislocation. Right THA. Lumbosacral fusion. Stool ball in the rectum. IMPRESSION: Soft tissue irregularity about the left gluteal subcutaneous tissue laterally. No acute osseous abnormality. Electronically Signed   By: Placido Sou M.D.   On: 09/08/2022 00:29   DG Chest 2 View  Result Date: 09/07/2022 CLINICAL DATA:  Hypotension, weakness EXAM: CHEST - 2 VIEW COMPARISON:  05/06/2022 FINDINGS: The heart size and mediastinal contours are within normal limits. There is pulmonary vascular congestion without focal consolidation. Aorta is calcified. No pneumothorax or pleural effusion. Aorta is calcified. The visualized skeletal structures are unremarkable. IMPRESSION: Pulmonary vascular congestion.  No focal consolidation Electronically Signed   By: Sammie Bench M.D.   On: 09/07/2022 20:47    Procedures Procedures  Medications Ordered in ED Medications  nitrofurantoin (macrocrystal-monohydrate) (MACROBID) capsule 100 mg (has no administration in time range)  lactated ringers bolus 1,000 mL (0 mLs Intravenous Stopped 09/07/22 2345)    ED Course/ Medical Decision Making/ A&P Clinical Course as of 09/08/22 0357  Wed Sep 07, 2022  2223 Monocytes Relative: 8 [RS]  Thu Sep 08, 2022  0353 Given patient wears a condom catheter daily at home, antibiotic choice for UTI was discussed with pharmacist Kavin Leech, [facility].  Given patient has no systemic symptoms, she recommends nitrofurantoin 100 mg twice per day for the next week.  This was prescribed at her recommendation.  Patient remains well-appearing and asymptomatic at this time, making urine in the emergency department, normal vital signs [RS]    Clinical Course User Index [RS] Ashdon Gillson, Gypsy Balsam, PA-C                           Medical Decision Making 71 year old male who presents with concern for  low BP.   Hypotension with SBP in the 90s. Cardipulmonary exam is unremarkable, abdominal exam is benign.   Patient at neurologic baseline with decreased motor activity below the waist secondary to spinal cord injury.  Normal perfusion in the extremities.  GU exam performed in the presence of chaperone Jasmine, testicles absent following surgery for Fournier's gangrene in the past, penis unremarkable.  No cellulitic changes in this area.  Amount and/or Complexity of Data Reviewed Labs: ordered. Decision-making details documented in ED Course.    Details: Leukocytosis of 15,000, mild anemia with hemoglobin of 12.8.  BMP with mild elevation in creatinine to 1.4, BUN elevated to 49.  Lactic acid elevated to 2.4.  UA concerning for possible infection, urine culture pending.  Blood cultures pending.  Repeat lactic acid cleared to 1.6 prior to administration of fluids. Radiology: ordered.    Details: Chest x-ray with pulmonary vascular congestion without focal consolidation. ECG/medicine tests:     Details:   EKG with sinus rhythm, no STEMI.  Risk Prescription drug management.   Overall clinical picture consistent with urinary tract infection, patient's vital signs have been very reassuring throughout his stay in the emergency department.  While the exact etiology of his reported low blood pressures at the outpatient facility is unclear if you do feel there is any emergent underlying etiology that would warrant further ED workup or inpatient management at this time.  Khaleel and his cousin  voiced understanding of his medical evaluation and treatment plan. Each of their questions answered to their expressed satisfaction.  Return precautions were given.  Patient is well-appearing, stable, and was discharged in good condition.  This chart was dictated using voice recognition software, Dragon. Despite the best efforts of this provider to proofread and correct errors, errors may still occur which can change documentation meaning.  Final Clinical Impression(s) / ED Diagnoses Final diagnoses:  Acute cystitis  without hematuria    Rx / DC Orders ED Discharge Orders          Ordered    nitrofurantoin, macrocrystal-monohydrate, (MACROBID) 100 MG capsule  2 times daily        09/08/22 0353              Roselynn Whitacre, Gypsy Balsam, PA-C 09/08/22 1980    Blanchie Dessert, MD 09/12/22 1229

## 2022-09-07 NOTE — ED Triage Notes (Signed)
Pt states that his blood pressure was low today at the eye surgery center.

## 2022-09-07 NOTE — ED Provider Triage Note (Signed)
Emergency Medicine Provider Triage Evaluation Note  ARVILLE POSTLEWAITE , a 71 y.o. male  was evaluated in triage.  Pt complains of low blood pressure.  He was at a ophthalmologist clinic when he was noted to have low blood pressure, and he was told his heart rhythm was irregular.  He denies chest pain, shortness of breath, lightheadedness, palpitations, or any other complaints.  Review of Systems  Positive: As above Negative: As above  Physical Exam  BP (!) 93/59 (BP Location: Left Arm)   Pulse 74   Temp 97.8 F (36.6 C) (Oral)   Resp 18   SpO2 95%  Gen:   Awake, no distress   Resp:  Normal effort  MSK:   Moves extremities without difficulty  Other:    Medical Decision Making  Medically screening exam initiated at 8:17 PM.  Appropriate orders placed.  JERIN FRANZEL was informed that the remainder of the evaluation will be completed by another provider, this initial triage assessment does not replace that evaluation, and the importance of remaining in the ED until their evaluation is complete.    Marita Kansas, PA-C 09/07/22 2018

## 2022-09-08 ENCOUNTER — Emergency Department (HOSPITAL_COMMUNITY): Payer: Medicare Other

## 2022-09-08 DIAGNOSIS — Z981 Arthrodesis status: Secondary | ICD-10-CM | POA: Diagnosis not present

## 2022-09-08 DIAGNOSIS — S31829A Unspecified open wound of left buttock, initial encounter: Secondary | ICD-10-CM | POA: Diagnosis not present

## 2022-09-08 LAB — URINALYSIS, ROUTINE W REFLEX MICROSCOPIC
Bilirubin Urine: NEGATIVE
Glucose, UA: NEGATIVE mg/dL
Hgb urine dipstick: NEGATIVE
Ketones, ur: NEGATIVE mg/dL
Nitrite: NEGATIVE
Protein, ur: 30 mg/dL — AB
Specific Gravity, Urine: 1.013 (ref 1.005–1.030)
pH: 8 (ref 5.0–8.0)

## 2022-09-08 MED ORDER — NITROFURANTOIN MONOHYD MACRO 100 MG PO CAPS
100.0000 mg | ORAL_CAPSULE | Freq: Once | ORAL | Status: AC
  Administered 2022-09-08: 100 mg via ORAL
  Filled 2022-09-08: qty 1

## 2022-09-08 MED ORDER — NITROFURANTOIN MONOHYD MACRO 100 MG PO CAPS: 100.0000 mg | ORAL_CAPSULE | Freq: Two times a day (BID) | ORAL | 0 refills | Status: AC

## 2022-09-08 NOTE — Discharge Instructions (Addendum)
Patrick Aguilar was seen in the ER today for his concern for low blood pressures.  These were normal in the emergency department after some fluids.  He does have a urinary tract infection but given his otherwise reassuring physical exam and vital signs, it was not required to keep him in the hospital.  He should take the prescribed antibiotics twice per day for the next week, please complete the entire course and follow-up with his primary care doctor.  Return to the ER with any new severe symptoms.

## 2022-09-09 DIAGNOSIS — Z7982 Long term (current) use of aspirin: Secondary | ICD-10-CM | POA: Diagnosis not present

## 2022-09-09 DIAGNOSIS — G8220 Paraplegia, unspecified: Secondary | ICD-10-CM | POA: Diagnosis not present

## 2022-09-09 DIAGNOSIS — M4716 Other spondylosis with myelopathy, lumbar region: Secondary | ICD-10-CM | POA: Diagnosis not present

## 2022-09-09 DIAGNOSIS — G9529 Other cord compression: Secondary | ICD-10-CM | POA: Diagnosis not present

## 2022-09-09 DIAGNOSIS — E1122 Type 2 diabetes mellitus with diabetic chronic kidney disease: Secondary | ICD-10-CM | POA: Diagnosis not present

## 2022-09-09 DIAGNOSIS — I129 Hypertensive chronic kidney disease with stage 1 through stage 4 chronic kidney disease, or unspecified chronic kidney disease: Secondary | ICD-10-CM | POA: Diagnosis not present

## 2022-09-09 DIAGNOSIS — E876 Hypokalemia: Secondary | ICD-10-CM | POA: Diagnosis not present

## 2022-09-09 DIAGNOSIS — L89224 Pressure ulcer of left hip, stage 4: Secondary | ICD-10-CM | POA: Diagnosis not present

## 2022-09-09 DIAGNOSIS — Z7984 Long term (current) use of oral hypoglycemic drugs: Secondary | ICD-10-CM | POA: Diagnosis not present

## 2022-09-09 DIAGNOSIS — I700 Atherosclerosis of aorta: Secondary | ICD-10-CM | POA: Diagnosis not present

## 2022-09-09 DIAGNOSIS — M4800 Spinal stenosis, site unspecified: Secondary | ICD-10-CM | POA: Diagnosis not present

## 2022-09-09 DIAGNOSIS — M459 Ankylosing spondylitis of unspecified sites in spine: Secondary | ICD-10-CM | POA: Diagnosis not present

## 2022-09-09 DIAGNOSIS — E119 Type 2 diabetes mellitus without complications: Secondary | ICD-10-CM | POA: Diagnosis not present

## 2022-09-09 DIAGNOSIS — G8929 Other chronic pain: Secondary | ICD-10-CM | POA: Diagnosis not present

## 2022-09-09 DIAGNOSIS — M479 Spondylosis, unspecified: Secondary | ICD-10-CM | POA: Diagnosis not present

## 2022-09-09 DIAGNOSIS — N182 Chronic kidney disease, stage 2 (mild): Secondary | ICD-10-CM | POA: Diagnosis not present

## 2022-09-09 DIAGNOSIS — E785 Hyperlipidemia, unspecified: Secondary | ICD-10-CM | POA: Diagnosis not present

## 2022-09-09 DIAGNOSIS — E1142 Type 2 diabetes mellitus with diabetic polyneuropathy: Secondary | ICD-10-CM | POA: Diagnosis not present

## 2022-09-09 DIAGNOSIS — M1990 Unspecified osteoarthritis, unspecified site: Secondary | ICD-10-CM | POA: Diagnosis not present

## 2022-09-09 DIAGNOSIS — J449 Chronic obstructive pulmonary disease, unspecified: Secondary | ICD-10-CM | POA: Diagnosis not present

## 2022-09-09 DIAGNOSIS — G319 Degenerative disease of nervous system, unspecified: Secondary | ICD-10-CM | POA: Diagnosis not present

## 2022-09-09 DIAGNOSIS — N390 Urinary tract infection, site not specified: Secondary | ICD-10-CM | POA: Diagnosis not present

## 2022-09-09 DIAGNOSIS — Z791 Long term (current) use of non-steroidal anti-inflammatories (NSAID): Secondary | ICD-10-CM | POA: Diagnosis not present

## 2022-09-09 DIAGNOSIS — K2100 Gastro-esophageal reflux disease with esophagitis, without bleeding: Secondary | ICD-10-CM | POA: Diagnosis not present

## 2022-09-09 LAB — URINE CULTURE

## 2022-09-13 DIAGNOSIS — I10 Essential (primary) hypertension: Secondary | ICD-10-CM | POA: Diagnosis not present

## 2022-09-13 DIAGNOSIS — J449 Chronic obstructive pulmonary disease, unspecified: Secondary | ICD-10-CM | POA: Diagnosis not present

## 2022-09-13 DIAGNOSIS — E1165 Type 2 diabetes mellitus with hyperglycemia: Secondary | ICD-10-CM | POA: Diagnosis not present

## 2022-09-13 LAB — CULTURE, BLOOD (ROUTINE X 2)
Culture: NO GROWTH
Culture: NO GROWTH
Special Requests: ADEQUATE
Special Requests: ADEQUATE

## 2022-09-14 DIAGNOSIS — M4800 Spinal stenosis, site unspecified: Secondary | ICD-10-CM | POA: Diagnosis not present

## 2022-09-14 DIAGNOSIS — E1122 Type 2 diabetes mellitus with diabetic chronic kidney disease: Secondary | ICD-10-CM | POA: Diagnosis not present

## 2022-09-14 DIAGNOSIS — G8220 Paraplegia, unspecified: Secondary | ICD-10-CM | POA: Diagnosis not present

## 2022-09-14 DIAGNOSIS — I700 Atherosclerosis of aorta: Secondary | ICD-10-CM | POA: Diagnosis not present

## 2022-09-14 DIAGNOSIS — E785 Hyperlipidemia, unspecified: Secondary | ICD-10-CM | POA: Diagnosis not present

## 2022-09-14 DIAGNOSIS — M1990 Unspecified osteoarthritis, unspecified site: Secondary | ICD-10-CM | POA: Diagnosis not present

## 2022-09-14 DIAGNOSIS — E876 Hypokalemia: Secondary | ICD-10-CM | POA: Diagnosis not present

## 2022-09-14 DIAGNOSIS — Z7984 Long term (current) use of oral hypoglycemic drugs: Secondary | ICD-10-CM | POA: Diagnosis not present

## 2022-09-14 DIAGNOSIS — L89224 Pressure ulcer of left hip, stage 4: Secondary | ICD-10-CM | POA: Diagnosis not present

## 2022-09-14 DIAGNOSIS — J449 Chronic obstructive pulmonary disease, unspecified: Secondary | ICD-10-CM | POA: Diagnosis not present

## 2022-09-14 DIAGNOSIS — M479 Spondylosis, unspecified: Secondary | ICD-10-CM | POA: Diagnosis not present

## 2022-09-14 DIAGNOSIS — I129 Hypertensive chronic kidney disease with stage 1 through stage 4 chronic kidney disease, or unspecified chronic kidney disease: Secondary | ICD-10-CM | POA: Diagnosis not present

## 2022-09-14 DIAGNOSIS — Z791 Long term (current) use of non-steroidal anti-inflammatories (NSAID): Secondary | ICD-10-CM | POA: Diagnosis not present

## 2022-09-14 DIAGNOSIS — N182 Chronic kidney disease, stage 2 (mild): Secondary | ICD-10-CM | POA: Diagnosis not present

## 2022-09-14 DIAGNOSIS — Z7982 Long term (current) use of aspirin: Secondary | ICD-10-CM | POA: Diagnosis not present

## 2022-09-14 DIAGNOSIS — E1142 Type 2 diabetes mellitus with diabetic polyneuropathy: Secondary | ICD-10-CM | POA: Diagnosis not present

## 2022-09-14 DIAGNOSIS — G8929 Other chronic pain: Secondary | ICD-10-CM | POA: Diagnosis not present

## 2022-09-14 DIAGNOSIS — G9529 Other cord compression: Secondary | ICD-10-CM | POA: Diagnosis not present

## 2022-09-14 DIAGNOSIS — K2100 Gastro-esophageal reflux disease with esophagitis, without bleeding: Secondary | ICD-10-CM | POA: Diagnosis not present

## 2022-09-14 DIAGNOSIS — G319 Degenerative disease of nervous system, unspecified: Secondary | ICD-10-CM | POA: Diagnosis not present

## 2022-09-14 DIAGNOSIS — M459 Ankylosing spondylitis of unspecified sites in spine: Secondary | ICD-10-CM | POA: Diagnosis not present

## 2022-09-16 DIAGNOSIS — N390 Urinary tract infection, site not specified: Secondary | ICD-10-CM | POA: Diagnosis not present

## 2022-09-16 DIAGNOSIS — L89324 Pressure ulcer of left buttock, stage 4: Secondary | ICD-10-CM | POA: Diagnosis not present

## 2022-09-16 DIAGNOSIS — E119 Type 2 diabetes mellitus without complications: Secondary | ICD-10-CM | POA: Diagnosis not present

## 2022-09-21 DIAGNOSIS — L89224 Pressure ulcer of left hip, stage 4: Secondary | ICD-10-CM | POA: Diagnosis not present

## 2022-09-21 DIAGNOSIS — E1142 Type 2 diabetes mellitus with diabetic polyneuropathy: Secondary | ICD-10-CM | POA: Diagnosis not present

## 2022-09-21 DIAGNOSIS — G8929 Other chronic pain: Secondary | ICD-10-CM | POA: Diagnosis not present

## 2022-09-21 DIAGNOSIS — M459 Ankylosing spondylitis of unspecified sites in spine: Secondary | ICD-10-CM | POA: Diagnosis not present

## 2022-09-21 DIAGNOSIS — G8220 Paraplegia, unspecified: Secondary | ICD-10-CM | POA: Diagnosis not present

## 2022-09-21 DIAGNOSIS — G319 Degenerative disease of nervous system, unspecified: Secondary | ICD-10-CM | POA: Diagnosis not present

## 2022-09-21 DIAGNOSIS — Z7984 Long term (current) use of oral hypoglycemic drugs: Secondary | ICD-10-CM | POA: Diagnosis not present

## 2022-09-21 DIAGNOSIS — I700 Atherosclerosis of aorta: Secondary | ICD-10-CM | POA: Diagnosis not present

## 2022-09-21 DIAGNOSIS — E1122 Type 2 diabetes mellitus with diabetic chronic kidney disease: Secondary | ICD-10-CM | POA: Diagnosis not present

## 2022-09-21 DIAGNOSIS — J449 Chronic obstructive pulmonary disease, unspecified: Secondary | ICD-10-CM | POA: Diagnosis not present

## 2022-09-21 DIAGNOSIS — E876 Hypokalemia: Secondary | ICD-10-CM | POA: Diagnosis not present

## 2022-09-21 DIAGNOSIS — Z791 Long term (current) use of non-steroidal anti-inflammatories (NSAID): Secondary | ICD-10-CM | POA: Diagnosis not present

## 2022-09-21 DIAGNOSIS — I129 Hypertensive chronic kidney disease with stage 1 through stage 4 chronic kidney disease, or unspecified chronic kidney disease: Secondary | ICD-10-CM | POA: Diagnosis not present

## 2022-09-21 DIAGNOSIS — M479 Spondylosis, unspecified: Secondary | ICD-10-CM | POA: Diagnosis not present

## 2022-09-21 DIAGNOSIS — M4800 Spinal stenosis, site unspecified: Secondary | ICD-10-CM | POA: Diagnosis not present

## 2022-09-21 DIAGNOSIS — G9529 Other cord compression: Secondary | ICD-10-CM | POA: Diagnosis not present

## 2022-09-21 DIAGNOSIS — Z7982 Long term (current) use of aspirin: Secondary | ICD-10-CM | POA: Diagnosis not present

## 2022-09-21 DIAGNOSIS — M1990 Unspecified osteoarthritis, unspecified site: Secondary | ICD-10-CM | POA: Diagnosis not present

## 2022-09-21 DIAGNOSIS — K2100 Gastro-esophageal reflux disease with esophagitis, without bleeding: Secondary | ICD-10-CM | POA: Diagnosis not present

## 2022-09-21 DIAGNOSIS — E785 Hyperlipidemia, unspecified: Secondary | ICD-10-CM | POA: Diagnosis not present

## 2022-09-21 DIAGNOSIS — N182 Chronic kidney disease, stage 2 (mild): Secondary | ICD-10-CM | POA: Diagnosis not present

## 2022-09-22 DIAGNOSIS — L89324 Pressure ulcer of left buttock, stage 4: Secondary | ICD-10-CM | POA: Diagnosis not present

## 2022-09-26 DIAGNOSIS — Z7984 Long term (current) use of oral hypoglycemic drugs: Secondary | ICD-10-CM | POA: Diagnosis not present

## 2022-09-26 DIAGNOSIS — I129 Hypertensive chronic kidney disease with stage 1 through stage 4 chronic kidney disease, or unspecified chronic kidney disease: Secondary | ICD-10-CM | POA: Diagnosis not present

## 2022-09-26 DIAGNOSIS — G8220 Paraplegia, unspecified: Secondary | ICD-10-CM | POA: Diagnosis not present

## 2022-09-26 DIAGNOSIS — J449 Chronic obstructive pulmonary disease, unspecified: Secondary | ICD-10-CM | POA: Diagnosis not present

## 2022-09-26 DIAGNOSIS — Z791 Long term (current) use of non-steroidal anti-inflammatories (NSAID): Secondary | ICD-10-CM | POA: Diagnosis not present

## 2022-09-26 DIAGNOSIS — M4800 Spinal stenosis, site unspecified: Secondary | ICD-10-CM | POA: Diagnosis not present

## 2022-09-26 DIAGNOSIS — M479 Spondylosis, unspecified: Secondary | ICD-10-CM | POA: Diagnosis not present

## 2022-09-26 DIAGNOSIS — M459 Ankylosing spondylitis of unspecified sites in spine: Secondary | ICD-10-CM | POA: Diagnosis not present

## 2022-09-26 DIAGNOSIS — Z7982 Long term (current) use of aspirin: Secondary | ICD-10-CM | POA: Diagnosis not present

## 2022-09-26 DIAGNOSIS — I700 Atherosclerosis of aorta: Secondary | ICD-10-CM | POA: Diagnosis not present

## 2022-09-26 DIAGNOSIS — K2100 Gastro-esophageal reflux disease with esophagitis, without bleeding: Secondary | ICD-10-CM | POA: Diagnosis not present

## 2022-09-26 DIAGNOSIS — E1142 Type 2 diabetes mellitus with diabetic polyneuropathy: Secondary | ICD-10-CM | POA: Diagnosis not present

## 2022-09-26 DIAGNOSIS — G319 Degenerative disease of nervous system, unspecified: Secondary | ICD-10-CM | POA: Diagnosis not present

## 2022-09-26 DIAGNOSIS — G8929 Other chronic pain: Secondary | ICD-10-CM | POA: Diagnosis not present

## 2022-09-26 DIAGNOSIS — E876 Hypokalemia: Secondary | ICD-10-CM | POA: Diagnosis not present

## 2022-09-26 DIAGNOSIS — G9529 Other cord compression: Secondary | ICD-10-CM | POA: Diagnosis not present

## 2022-09-26 DIAGNOSIS — E785 Hyperlipidemia, unspecified: Secondary | ICD-10-CM | POA: Diagnosis not present

## 2022-09-26 DIAGNOSIS — N182 Chronic kidney disease, stage 2 (mild): Secondary | ICD-10-CM | POA: Diagnosis not present

## 2022-09-26 DIAGNOSIS — E1122 Type 2 diabetes mellitus with diabetic chronic kidney disease: Secondary | ICD-10-CM | POA: Diagnosis not present

## 2022-09-26 DIAGNOSIS — L89224 Pressure ulcer of left hip, stage 4: Secondary | ICD-10-CM | POA: Diagnosis not present

## 2022-09-26 DIAGNOSIS — M1990 Unspecified osteoarthritis, unspecified site: Secondary | ICD-10-CM | POA: Diagnosis not present

## 2022-09-28 DIAGNOSIS — M1990 Unspecified osteoarthritis, unspecified site: Secondary | ICD-10-CM | POA: Diagnosis not present

## 2022-09-28 DIAGNOSIS — E1142 Type 2 diabetes mellitus with diabetic polyneuropathy: Secondary | ICD-10-CM | POA: Diagnosis not present

## 2022-09-28 DIAGNOSIS — Z981 Arthrodesis status: Secondary | ICD-10-CM | POA: Diagnosis not present

## 2022-09-28 DIAGNOSIS — L89229 Pressure ulcer of left hip, unspecified stage: Secondary | ICD-10-CM | POA: Diagnosis not present

## 2022-09-28 DIAGNOSIS — J449 Chronic obstructive pulmonary disease, unspecified: Secondary | ICD-10-CM | POA: Diagnosis not present

## 2022-09-28 DIAGNOSIS — Z87891 Personal history of nicotine dependence: Secondary | ICD-10-CM | POA: Diagnosis not present

## 2022-09-28 DIAGNOSIS — X58XXXD Exposure to other specified factors, subsequent encounter: Secondary | ICD-10-CM | POA: Diagnosis not present

## 2022-09-28 DIAGNOSIS — Z885 Allergy status to narcotic agent status: Secondary | ICD-10-CM | POA: Diagnosis not present

## 2022-09-28 DIAGNOSIS — Z7982 Long term (current) use of aspirin: Secondary | ICD-10-CM | POA: Diagnosis not present

## 2022-09-28 DIAGNOSIS — G8220 Paraplegia, unspecified: Secondary | ICD-10-CM | POA: Diagnosis not present

## 2022-09-28 DIAGNOSIS — K2100 Gastro-esophageal reflux disease with esophagitis, without bleeding: Secondary | ICD-10-CM | POA: Diagnosis not present

## 2022-09-28 DIAGNOSIS — E785 Hyperlipidemia, unspecified: Secondary | ICD-10-CM | POA: Diagnosis not present

## 2022-09-28 DIAGNOSIS — I10 Essential (primary) hypertension: Secondary | ICD-10-CM | POA: Diagnosis not present

## 2022-09-28 DIAGNOSIS — Z86711 Personal history of pulmonary embolism: Secondary | ICD-10-CM | POA: Diagnosis not present

## 2022-09-28 DIAGNOSIS — S71102D Unspecified open wound, left thigh, subsequent encounter: Secondary | ICD-10-CM | POA: Diagnosis not present

## 2022-09-28 DIAGNOSIS — Z7984 Long term (current) use of oral hypoglycemic drugs: Secondary | ICD-10-CM | POA: Diagnosis not present

## 2022-09-28 DIAGNOSIS — Z79899 Other long term (current) drug therapy: Secondary | ICD-10-CM | POA: Diagnosis not present

## 2022-09-28 DIAGNOSIS — S71002D Unspecified open wound, left hip, subsequent encounter: Secondary | ICD-10-CM | POA: Diagnosis not present

## 2022-09-28 DIAGNOSIS — Z96641 Presence of right artificial hip joint: Secondary | ICD-10-CM | POA: Diagnosis not present

## 2022-09-28 DIAGNOSIS — E7800 Pure hypercholesterolemia, unspecified: Secondary | ICD-10-CM | POA: Diagnosis not present

## 2022-09-28 DIAGNOSIS — Z66 Do not resuscitate: Secondary | ICD-10-CM | POA: Diagnosis not present

## 2022-09-30 DIAGNOSIS — H2512 Age-related nuclear cataract, left eye: Secondary | ICD-10-CM | POA: Diagnosis not present

## 2022-09-30 DIAGNOSIS — H269 Unspecified cataract: Secondary | ICD-10-CM | POA: Diagnosis not present

## 2022-10-04 DIAGNOSIS — G9520 Unspecified cord compression: Secondary | ICD-10-CM | POA: Diagnosis not present

## 2022-10-04 DIAGNOSIS — Z79891 Long term (current) use of opiate analgesic: Secondary | ICD-10-CM | POA: Diagnosis not present

## 2022-10-04 DIAGNOSIS — M48062 Spinal stenosis, lumbar region with neurogenic claudication: Secondary | ICD-10-CM | POA: Diagnosis not present

## 2022-10-04 DIAGNOSIS — G894 Chronic pain syndrome: Secondary | ICD-10-CM | POA: Diagnosis not present

## 2022-10-04 DIAGNOSIS — R29898 Other symptoms and signs involving the musculoskeletal system: Secondary | ICD-10-CM | POA: Diagnosis not present

## 2022-10-04 DIAGNOSIS — M47816 Spondylosis without myelopathy or radiculopathy, lumbar region: Secondary | ICD-10-CM | POA: Diagnosis not present

## 2022-10-04 DIAGNOSIS — M4804 Spinal stenosis, thoracic region: Secondary | ICD-10-CM | POA: Diagnosis not present

## 2022-10-06 DIAGNOSIS — E119 Type 2 diabetes mellitus without complications: Secondary | ICD-10-CM | POA: Diagnosis not present

## 2022-10-06 DIAGNOSIS — N179 Acute kidney failure, unspecified: Secondary | ICD-10-CM | POA: Diagnosis not present

## 2022-10-06 DIAGNOSIS — G8220 Paraplegia, unspecified: Secondary | ICD-10-CM | POA: Diagnosis not present

## 2022-10-06 DIAGNOSIS — E876 Hypokalemia: Secondary | ICD-10-CM | POA: Diagnosis not present

## 2022-10-06 DIAGNOSIS — L89229 Pressure ulcer of left hip, unspecified stage: Secondary | ICD-10-CM | POA: Diagnosis not present

## 2022-10-10 DIAGNOSIS — J449 Chronic obstructive pulmonary disease, unspecified: Secondary | ICD-10-CM | POA: Diagnosis not present

## 2022-10-10 DIAGNOSIS — S71102A Unspecified open wound, left thigh, initial encounter: Secondary | ICD-10-CM | POA: Diagnosis not present

## 2022-10-10 DIAGNOSIS — M4716 Other spondylosis with myelopathy, lumbar region: Secondary | ICD-10-CM | POA: Diagnosis not present

## 2022-10-11 DIAGNOSIS — N179 Acute kidney failure, unspecified: Secondary | ICD-10-CM | POA: Diagnosis not present

## 2022-10-11 DIAGNOSIS — E876 Hypokalemia: Secondary | ICD-10-CM | POA: Diagnosis not present

## 2022-10-11 DIAGNOSIS — L89229 Pressure ulcer of left hip, unspecified stage: Secondary | ICD-10-CM | POA: Diagnosis not present

## 2022-10-11 DIAGNOSIS — G8220 Paraplegia, unspecified: Secondary | ICD-10-CM | POA: Diagnosis not present

## 2022-10-11 DIAGNOSIS — E119 Type 2 diabetes mellitus without complications: Secondary | ICD-10-CM | POA: Diagnosis not present

## 2022-10-12 ENCOUNTER — Encounter: Payer: Self-pay | Admitting: "Endocrinology

## 2022-10-12 ENCOUNTER — Ambulatory Visit: Payer: Medicare Other | Admitting: "Endocrinology

## 2022-10-12 VITALS — BP 130/82 | HR 56

## 2022-10-12 DIAGNOSIS — I10 Essential (primary) hypertension: Secondary | ICD-10-CM

## 2022-10-12 DIAGNOSIS — E119 Type 2 diabetes mellitus without complications: Secondary | ICD-10-CM

## 2022-10-12 DIAGNOSIS — E782 Mixed hyperlipidemia: Secondary | ICD-10-CM | POA: Diagnosis not present

## 2022-10-12 DIAGNOSIS — L03116 Cellulitis of left lower limb: Secondary | ICD-10-CM | POA: Diagnosis not present

## 2022-10-12 LAB — POCT GLYCOSYLATED HEMOGLOBIN (HGB A1C): HbA1c, POC (controlled diabetic range): 5.7 % (ref 0.0–7.0)

## 2022-10-12 MED ORDER — SULFAMETHOXAZOLE-TRIMETHOPRIM 400-80 MG PO TABS: 1.0000 | ORAL_TABLET | Freq: Two times a day (BID) | ORAL | 0 refills | Status: DC

## 2022-10-12 NOTE — Patient Instructions (Signed)
                                     Advice for Weight Management  -For most of us the best way to lose weight is by diet management. Generally speaking, diet management means consuming less calories intentionally which over time brings about progressive weight loss.  This can be achieved more effectively by avoiding ultra processed carbohydrates, processed meats, unhealthy fats.    It is critically important to know your numbers: how much calorie you are consuming and how much calorie you need. More importantly, our carbohydrates sources should be unprocessed naturally occurring  complex starch food items.  It is always important to balance nutrition also by  appropriate intake of proteins (mainly plant-based), healthy fats/oils, plenty of fruits and vegetables.   -The American College of Lifestyle Medicine (ACL M) recommends nutrition derived mostly from Whole Food, Plant Predominant Sources example an apple instead of applesauce or apple pie. Eat Plenty of vegetables, Mushrooms, fruits, Legumes, Whole Grains, Nuts, seeds in lieu of processed meats, processed snacks/pastries red meat, poultry, eggs.  Use only water or unsweetened tea for hydration.  The College also recommends the need to stay away from risky substances including alcohol, smoking; obtaining 7-9 hours of restorative sleep, at least 150 minutes of moderate intensity exercise weekly, importance of healthy social connections, and being mindful of stress and seek help when it is overwhelming.    -Sticking to a routine mealtime to eat 3 meals a day and avoiding unnecessary snacks is shown to have a big role in weight control. Under normal circumstances, the only time we burn stored energy is when we are hungry, so allow  some hunger to take place- hunger means no food between appropriate meal times, only water.  It is not advisable to starve.   -It is better to avoid simple carbohydrates including:  Cakes, Sweet Desserts, Ice Cream, Soda (diet and regular), Sweet Tea, Candies, Chips, Cookies, Store Bought Juices, Alcohol in Excess of  1-2 drinks a day, Lemonade,  Artificial Sweeteners, Doughnuts, Coffee Creamers, "Sugar-free" Products, etc, etc.  This is not a complete list.....    -Consulting with certified diabetes educators is proven to provide you with the most accurate and current information on diet.  Also, you may be  interested in discussing diet options/exchanges , we can schedule a visit with Patrick Aguilar, RDN, CDE for individualized nutrition education.  -Exercise: If you are able: 30 -60 minutes a day ,4 days a week, or 150 minutes of moderate intensity exercise weekly.    The longer the better if tolerated.  Combine stretch, strength, and aerobic activities.  If you were told in the past that you have high risk for cardiovascular diseases, or if you are currently symptomatic, you may seek evaluation by your heart doctor prior to initiating moderate to intense exercise programs.                                  Additional Care Considerations for Diabetes/Prediabetes   -Diabetes  is a chronic disease.  The most important care consideration is regular follow-up with your diabetes care provider with the goal being avoiding or delaying its complications and to take advantage of advances in medications and technology.  If appropriate actions are taken early enough, type 2 diabetes can even be   reversed.  Seek information from the right source.  - Whole Food, Plant Predominant Nutrition is highly recommended: Eat Plenty of vegetables, Mushrooms, fruits, Legumes, Whole Grains, Nuts, seeds in lieu of processed meats, processed snacks/pastries red meat, poultry, eggs as recommended by American College of  Lifestyle Medicine (ACLM).  -Type 2 diabetes is known to coexist with other important comorbidities such as high blood pressure and high cholesterol.  It is critical to control not only the  diabetes but also the high blood pressure and high cholesterol to minimize and delay the risk of complications including coronary artery disease, stroke, amputations, blindness, etc.  The good news is that this diet recommendation for type 2 diabetes is also very helpful for managing high cholesterol and high blood blood pressure.  - Studies showed that people with diabetes will benefit from a class of medications known as ACE inhibitors and statins.  Unless there are specific reasons not to be on these medications, the standard of care is to consider getting one from these groups of medications at an optimal doses.  These medications are generally considered safe and proven to help protect the heart and the kidneys.    - People with diabetes are encouraged to initiate and maintain regular follow-up with eye doctors, foot doctors, dentists , and if necessary heart and kidney doctors.     - It is highly recommended that people with diabetes quit smoking or stay away from smoking, and get yearly  flu vaccine and pneumonia vaccine at least every 5 years.  See above for additional recommendations on exercise, sleep, stress management , and healthy social connections.      

## 2022-10-12 NOTE — Progress Notes (Signed)
10/12/2022, 4:59 PM  Endocrinology follow-up note  Subjective:    Patient ID: Patrick Aguilar, male    DOB: 1950-11-15.  Patrick Aguilar is being seen in follow-up after he was seen in consultation for management of currently controlled asymptomatic diabetes requested by  Monico Blitz, MD.   Past Medical History:  Diagnosis Date   AKI (acute kidney injury) ([place]) 12/2017   Angio-edema    Anxiety    Arthritis    Complication of anesthesia    low respirations, low BP   COPD (chronic obstructive pulmonary disease) ([place])    DDD (degenerative disc disease), lumbar    Diabetes mellitus without complication ([place])    type 2   Diastolic dysfunction 62/83/1517   Moderate noted on ECHO   Fournier's gangrene in male    GERD (gastroesophageal reflux disease)    History of ARDS    History of necrotizing fasciitis    Severe   Hypertension    Lumbar spondylosis    Numbness and tingling of both upper extremities    Pulmonary hypertension ([place]) 12/14/2017   Mild, noted on ECHO   Recurrent upper respiratory infection (URI)    Respiratory failure, acute ([place]) 12/2017   Septic shock ([place]) 12/2017   Shortness of breath dyspnea    Testicular pain, left    Wears glasses     Past Surgical History:  Procedure Laterality Date   ADENOIDECTOMY     APPENDECTOMY     APPLICATION OF A-CELL OF CHEST/ABDOMEN N/A 01/08/2018   Procedure: APPLICATION OF A-CELL OF GROIN;  Surgeon: Wallace Going, DO;  Location: [facility];  Service: Plastics;  Laterality: N/A;   APPLICATION OF A-CELL OF EXTREMITY N/A 12/25/2017   Procedure: APPLICATION OF A-CELL;  Surgeon: Wallace Going, DO;  Location: [facility];  Service: Plastics;  Laterality: N/A;   BACK SURGERY     x5   CARDIAC CATHETERIZATION  2006   neg   CERVICAL DISC SURGERY     x2   COLONOSCOPY     DEBRIDEMENT AND CLOSURE WOUND N/A 01/26/2018   Procedure: REVISION OF PERINEUM WOUND WITH DEBRIDEMENT, PARTIAL CLOSURE OF  PERINEUM, PLACEMENT OF [place];  Surgeon: Wallace Going, DO;  Location: [facility];  Service: Plastics;  Laterality: N/A;   DENTAL SURGERY     teeth extractions   groin wound  01/08/2018   : EXCISION OF GROIN WOUND WITH PLACEMENT OF ACELL, AND PRIMARY WOUND CLOSURE (N/A Scrotum)   I & D EXTREMITY N/A 03/12/2018   Procedure: IRRIGATION AND DEBRIDEMENT PERIMUM WOUND WITH CLOSURE;  Surgeon: Wallace Going, DO;  Location: [facility];  Service: Plastics;  Laterality: N/A;   INCISION AND DRAINAGE ABSCESS Left 07/11/2018   Procedure: INCISION AND DRAINAGE ABSCESS LEFT THIGH;  Surgeon: Franchot Gallo, MD;  Location: [facility];  Service: Urology;  Laterality: Left;   INCISION AND DRAINAGE OF WOUND N/A 12/25/2017   Procedure: Irrigation and debridement of Fournier's of scrotum with placement of testes in subcutaneous thigh pockets and Acell placement;  Surgeon: Wallace Going, DO;  Location: [facility];  Service: Plastics;  Laterality: N/A;   INCISION AND DRAINAGE OF WOUND N/A 01/08/2018   Procedure: EXCISION OF GROIN WOUND WITH PLACEMENT OF ACELL, AND PRIMARY WOUND CLOSURE;  Surgeon: Wallace Going, DO;  Location: [facility];  Service: Plastics;  Laterality: N/A;   ORCHIECTOMY N/A 12/13/2017   Procedure: EXCISION OF SCROTUM AND DEBRIDEMENT OF PENIS;  Surgeon: Diona Fanti,  Jeannett Senior, MD;  Location: [facility];  Service: Urology;  Laterality: N/A;   ORCHIECTOMY Left 06/22/2018   Procedure: ORCHIECTOMY;  Surgeon: Marcine Matar, MD;  Location: [facility];  Service: Urology;  Laterality: Left;   PLANTAR FASCIA SURGERY Bilateral    shoulders Bilateral    rotator cuff   SUBMANDIBULAR GLAND EXCISION Left 03/12/2015   Procedure: LEFT SUBMANDIBULAR GLAND RESECTION;  Surgeon: Christia Reading, MD;  Location: [facility];  Service: ENT;  Laterality: Left;   TONSILLECTOMY     TOTAL HIP ARTHROPLASTY Right 03/06/2019   Procedure: TOTAL HIP ARTHROPLASTY ANTERIOR APPROACH;  Surgeon: Ollen Gross, MD;  Location: [facility];  Service: Orthopedics;  Laterality: Right;     Social History   Socioeconomic History   Marital status: Single    Spouse name: Not on file   Number of children: Not on file   Years of education: Not on file   Highest education level: Not on file  Occupational History   Not on file  Tobacco Use   Smoking status: Former    Packs/day: 1.00    Years: 50.00    Total pack years: 50.00    Types: Cigarettes    Quit date: 12/13/2016    Years since quitting: 5.8   Smokeless tobacco: Never  Vaping Use   Vaping Use: Never used  Substance and Sexual Activity   Alcohol use: No    Comment: quit alcohol 80's   Drug use: No   Sexual activity: Not on file  Other Topics Concern   Not on file  Social History Narrative   Not on file   Social Determinants of Health   Financial Resource Strain: Not on file  Food Insecurity: Not on file  Transportation Needs: Not on file  Physical Activity: Not on file  Stress: Not on file  Social Connections: Not on file    Family History  Problem Relation Age of Onset   Lung cancer Mother    Bladder Cancer Father    Allergic rhinitis Neg Hx    Asthma Neg Hx    Eczema Neg Hx    Urticaria Neg Hx     Outpatient Encounter Medications as of 10/12/2022  Medication Sig   sulfamethoxazole-trimethoprim (BACTRIM) 400-80 MG tablet Take 1 tablet by mouth 2 (two) times daily.   albuterol (PROVENTIL) (2.5 MG/3ML) 0.083% nebulizer solution Inhale 2.5 mg into the lungs every 6 (six) hours as needed for shortness of breath or wheezing.   albuterol (VENTOLIN HFA) 108 (90 Base) MCG/ACT inhaler Inhale 2 puffs into the lungs every 6 (six) hours as needed for wheezing or shortness of breath.   aspirin 81 MG EC tablet Take by mouth.   atenolol-chlorthalidone (TENORETIC) 50-25 MG tablet Take 1 tablet by mouth daily with breakfast.    blood glucose meter kit and supplies KIT Dispense based on patient and insurance preference. Use up to  four times daily as directed. (FOR ICD-9 250.00, 250.01).   celecoxib (CELEBREX) 200 MG capsule Take 1 capsule by mouth daily.   doxazosin (CARDURA XL) 8 MG 24 hr tablet Take by mouth.   famotidine (PEPCID) 20 MG tablet Take 20 mg by mouth 2 (two) times daily.   furosemide (LASIX) 20 MG tablet Take 20 mg by mouth daily after breakfast.    gabapentin (NEURONTIN) 800 MG tablet Take 800 mg by mouth every 6 (six) hours.    glipiZIDE (GLUCOTROL) 5 MG tablet Take 1 tablet (5 mg total) by mouth 2 (two)  times daily.   Insulin Pen Needle (BD PEN NEEDLE NANO U/F) 32G X 4 MM MISC 1 each by Does not apply route 4 (four) times daily. (Patient not taking: Reported on 10/12/2022)   metFORMIN (GLUCOPHAGE) 500 MG tablet Take 500 mg by mouth once.   oxyCODONE-acetaminophen (PERCOCET) 10-325 MG tablet Take 1 tablet by mouth every 6 (six) hours.   OXYGEN Inhale 1-3 L into the lungs.   pantoprazole (PROTONIX) 40 MG tablet Take 40 mg by mouth daily.   potassium chloride (KLOR-CON) 20 MEQ packet Take by mouth.   rosuvastatin (CRESTOR) 5 MG tablet Take 5 mg by mouth every Monday.   traZODone (DESYREL) 100 MG tablet Take 100 mg by mouth at bedtime.   [DISCONTINUED] insulin glargine (LANTUS) 100 UNIT/ML Solostar Pen Inject 30 Units into the skin at bedtime. (Patient not taking: Reported on 10/12/2022)   No facility-administered encounter medications on file as of 10/12/2022.    ALLERGIES: Allergies  Allergen Reactions   Tramadol Other (See Comments)    tremor   Adenosine     can't tolerate  05/2005   Bupropion     Nausea   Morphine And Related Itching    VACCINATION STATUS: Immunization History  Administered Date(s) Administered   Td,absorbed, Preservative Free, Adult Use, Lf Unspecified 03/05/2007   Tdap 08/10/2017   Zoster Recombinat (Shingrix) 08/10/2017    Diabetes He presents for his follow-up diabetic visit. He has type 2 diabetes mellitus. Onset time: She was diagnosed at approximate age of 89  years. His disease course has been improving. There are no hypoglycemic associated symptoms. Pertinent negatives for hypoglycemia include no confusion, headaches, pallor or seizures. There are no diabetic associated symptoms. Pertinent negatives for diabetes include no chest pain, no fatigue, no polydipsia, no polyphagia, no polyuria and no weakness. There are no hypoglycemic complications. Symptoms are improving. Diabetic complications include nephropathy. Risk factors for coronary artery disease include diabetes mellitus, dyslipidemia, hypertension, male sex and sedentary lifestyle. Current diabetic treatments: Is currently on Metformin 500 mg p.o. once  daily, glipizide 5 mg po BID. He is following a generally unhealthy diet. When asked about meal planning, he reported none. He has not had a previous visit with a dietitian. He participates in exercise intermittently. (His presents without any meter nor logs.  His point-of-care A1c is 5.7%.  After he has taken his insulin for several weeks, he has not taken it in the last month due to tightening of his glycemic profile.  He remains only on metformin 500 mg p.o. once a day, glipizide 5 mg p.o. twice daily.  He denies hypoglycemia.   ) An ACE inhibitor/angiotensin II receptor blocker is being taken.  Hyperlipidemia This is a chronic problem. The current episode started more than 1 year ago. Exacerbating diseases include diabetes. Pertinent negatives include no chest pain, myalgias or shortness of breath. Current antihyperlipidemic treatment includes statins. Risk factors for coronary artery disease include diabetes mellitus, dyslipidemia, male sex, hypertension and a sedentary lifestyle.  Hypertension This is a chronic problem. The current episode started more than 1 year ago. The problem is controlled. Pertinent negatives include no chest pain, headaches, neck pain, palpitations or shortness of breath. Risk factors for coronary artery disease include  diabetes mellitus, dyslipidemia, sedentary lifestyle and smoking/tobacco exposure. Past treatments include beta blockers and diuretics. Hypertensive end-organ damage includes heart failure.    Review of Systems  Constitutional:  Negative for chills, fatigue, fever and unexpected weight change.  HENT:  Negative for dental problem,  mouth sores and trouble swallowing.   Eyes:  Negative for visual disturbance.  Respiratory:  Negative for cough, choking, chest tightness, shortness of breath and wheezing.   Cardiovascular:  Negative for chest pain, palpitations and leg swelling.  Gastrointestinal:  Negative for abdominal distention, abdominal pain, constipation, diarrhea, nausea and vomiting.  Endocrine: Negative for polydipsia, polyphagia and polyuria.  Genitourinary:  Negative for dysuria, flank pain, hematuria and urgency.  Musculoskeletal:  Positive for gait problem. Negative for back pain, myalgias and neck pain.       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   Skin:  Negative for pallor, rash and wound.  Neurological:  Negative for seizures, syncope, weakness, numbness and headaches.  Psychiatric/Behavioral:  Negative for confusion and dysphoric mood.     Objective:       10/12/2022    2:56 PM 09/08/2022    3:45 AM 09/08/2022    3:15 AM  Vitals with BMI  Systolic 130 114 629  Diastolic 82 74 68  Pulse 56 67 69    BP 130/82   Pulse (!) 56   Wt Readings from Last 3 Encounters:  09/07/22 182 lb 15.7 oz (83 kg)  10/12/21 183 lb (83 kg)  08/16/21 183 lb 3.2 oz (83.1 kg)       CMP ( most recent) CMP     Component Value Date/Time   NA 134 (L) 09/07/2022 2033   NA 140 04/06/2022 1129   K 3.4 (L) 09/07/2022 2033   CL 98 09/07/2022 2033   CO2 24 09/07/2022 2033   GLUCOSE 139 (H) 09/07/2022 2033   BUN 49 (H) 09/07/2022 2033   BUN 25 04/06/2022 1129   CREATININE 1.40 (H) 09/07/2022 2033   CREATININE 1.13 05/13/2020 0000   CALCIUM 9.1 09/07/2022 2033    PROT 6.6 04/06/2022 1129   ALBUMIN 3.9 04/06/2022 1129   AST 12 04/06/2022 1129   ALT 13 04/06/2022 1129   ALKPHOS 80 04/06/2022 1129   BILITOT 0.3 04/06/2022 1129   GFRNONAA 54 (L) 09/07/2022 2033   GFRNONAA 66 05/13/2020 0000   GFRAA 76 05/13/2020 0000     Diabetic Labs (most recent): Lab Results  Component Value Date   HGBA1C 5.7 10/12/2022   HGBA1C 6.0 04/11/2022   HGBA1C 5.9 08/04/2021   MICROALBUR <0.2 05/13/2020     Lipid Panel ( most recent) Lipid Panel     Component Value Date/Time   CHOL 130 04/06/2022 1129   TRIG 120 04/06/2022 1129   HDL 46 04/06/2022 1129   CHOLHDL 2.8 04/06/2022 1129   LDLCALC 62 04/06/2022 1129   LABVLDL 22 04/06/2022 1129     Assessment & Plan:   1. Type 2 diabetes mellitus without complication, without long-term current use of insulin (HCC)  - Patrick Aguilar has currently controlled asymptomatic type 2 DM since  72 years of age.   His presents without any meter nor logs.  His point-of-care A1c is 5.7%.  After he has taken his insulin for several weeks, he has not taken it in the last month due to tightening of his glycemic profile.  He remains only on metformin 500 mg p.o. once a day, glipizide 5 mg p.o. twice daily.  He denies hypoglycemia.  - I had a long discussion with him about the progressive nature of diabetes and the pathology behind its complications. -his diabetes is complicated by  hx of  heavy smoking, deconditioning/sedentary life, diastolic dysfunction and he remains at a high  risk for more acute and chronic complications which include CAD, CVA, CKD, retinopathy, and neuropathy. These are all discussed in detail with him.  - I have counseled him on diet  and weight management  by adopting a carbohydrate restricted/protein rich diet. Patient is encouraged to switch to  unprocessed or minimally processed     complex starch and increased protein intake (animal or plant source), fruits, and vegetables. -  he is advised to  stick to a routine mealtimes to eat 3 meals  a day and avoid unnecessary snacks ( to snack only to correct hypoglycemia).   - he acknowledges that there is a room for improvement in his food and drink choices. - Suggestion is made for him to avoid simple carbohydrates  from his diet including Cakes, Sweet Desserts, Ice Cream, Soda (diet and regular), Sweet Tea, Candies, Chips, Cookies, Store Bought Juices, Alcohol in Excess of  1-2 drinks a day, Artificial Sweeteners,  Coffee Creamer, and "Sugar-free" Products, Lemonade. This will help patient to have more stable blood glucose profile and potentially avoid unintended weight gain.  - he will be scheduled with Norm Salt, RDN, CDE for diabetes education.  - I have approached him with the following individualized plan to manage  his diabetes and patient agrees:   -In light of his presentation with A1c of 5.7%, he is advised to stay off of insulin for now.  He is advised to continue metformin 500 mg p.o. once a day at breakfast, glipizide 5 mg p.o. twice daily, with breakfast and supper.  He is encouraged to monitor blood glucose twice a day-daily before breakfast and at bedtime.       He is not a candidate for SGLT2 inhibitors due to his prior history of Fournier's gangrene.  - Specific targets for  A1c;  LDL, HDL,  and Triglycerides were discussed with the patient.  2) Blood Pressure /Hypertension:  -His blood pressure is controlled to target.  He is advised to discontinue amlodipine.  He is advised to continue  atenolol/HCTZ mg p.o. daily with breakfast .  3) Lipids/Hyperlipidemia:   Review of his recent lipid panel showed improving hypertriglyceridemia at 177 improving from 259.  His LDL is improving to 62.  He is advised to continue Crestor 5 mg p.o. nightly.  Side effects and precautions discussed with him.    4)  Weight/Diet:  There is no height or weight on file to calculate BMI.  -He would benefit from some weight loss.   I discussed  with him the fact that loss of 5 - 10% of his  current body weight will have the most impact on his diabetes management.  Exercise, and detailed carbohydrates information provided  -  detailed on discharge instructions.  5) Chronic Care/Health Maintenance:  -he  is on Statin medications and  is encouraged to initiate and continue to follow up with Ophthalmology, Dentist,  Podiatrist at least yearly or according to recommendations, and advised to   stay away from smoking. I have recommended yearly flu vaccine and pneumonia vaccine at least every 5 years; moderate intensity exercise for up to 150 minutes weekly; and  sleep for at least 7 hours a day.  He was found to have superficial cellulitis on his left big toe.  Patient does not have any sensation on his lower extremities.  He will benefit from early intervention.  I discussed and initiated short course low-dose Bactrim 400-800 mg p.o. twice daily for 7 days.  He is advised to  seek care at his PCP if he does not see significant improvement or if it is getting worse in 7 days.     - he is  advised to maintain close follow up with Monico Blitz, MD for primary care needs, as well as his other providers for optimal and coordinated care.   I spent  26  minutes in the care of the patient today including review of labs from Thyroid Function, CMP, and other relevant labs ; review of his glycemic history; face-to-face time discussing  his lab results and symptoms, medications doses, his options of short and long term treatment based on the latest standards of care / guidelines;   and documenting the encounter.  Cherylynn Ridges  participated in the discussions, expressed understanding, and voiced agreement with the above plans.  All questions were answered to his satisfaction. he is encouraged to contact clinic should he have any questions or concerns prior to his return visit.     Follow up plan: - Return in about 4 months (around 02/10/2023) for Bring  Meter/CGM Device/Logs- A1c in Office.  Glade Lloyd, MD [facility] [address] [place], [place] [address] Phone: [phone]  Fax: [phone]    10/12/2022, 4:59 PM  This note was partially dictated with voice recognition software. Similar sounding words can be transcribed inadequately or may not  be corrected upon review.

## 2022-10-13 DIAGNOSIS — E876 Hypokalemia: Secondary | ICD-10-CM | POA: Diagnosis not present

## 2022-10-13 DIAGNOSIS — J449 Chronic obstructive pulmonary disease, unspecified: Secondary | ICD-10-CM | POA: Diagnosis not present

## 2022-10-13 DIAGNOSIS — L89223 Pressure ulcer of left hip, stage 3: Secondary | ICD-10-CM | POA: Diagnosis not present

## 2022-10-13 DIAGNOSIS — I10 Essential (primary) hypertension: Secondary | ICD-10-CM | POA: Diagnosis not present

## 2022-10-13 DIAGNOSIS — E1142 Type 2 diabetes mellitus with diabetic polyneuropathy: Secondary | ICD-10-CM | POA: Diagnosis not present

## 2022-10-13 DIAGNOSIS — G8220 Paraplegia, unspecified: Secondary | ICD-10-CM | POA: Diagnosis not present

## 2022-10-14 DIAGNOSIS — H269 Unspecified cataract: Secondary | ICD-10-CM | POA: Diagnosis not present

## 2022-10-14 DIAGNOSIS — H2511 Age-related nuclear cataract, right eye: Secondary | ICD-10-CM | POA: Diagnosis not present

## 2022-10-18 DIAGNOSIS — L89223 Pressure ulcer of left hip, stage 3: Secondary | ICD-10-CM | POA: Diagnosis not present

## 2022-10-18 DIAGNOSIS — J449 Chronic obstructive pulmonary disease, unspecified: Secondary | ICD-10-CM | POA: Diagnosis not present

## 2022-10-18 DIAGNOSIS — E876 Hypokalemia: Secondary | ICD-10-CM | POA: Diagnosis not present

## 2022-10-18 DIAGNOSIS — G8220 Paraplegia, unspecified: Secondary | ICD-10-CM | POA: Diagnosis not present

## 2022-10-18 DIAGNOSIS — I10 Essential (primary) hypertension: Secondary | ICD-10-CM | POA: Diagnosis not present

## 2022-10-18 DIAGNOSIS — E1142 Type 2 diabetes mellitus with diabetic polyneuropathy: Secondary | ICD-10-CM | POA: Diagnosis not present

## 2022-10-19 DIAGNOSIS — G8220 Paraplegia, unspecified: Secondary | ICD-10-CM | POA: Diagnosis not present

## 2022-10-19 DIAGNOSIS — E7800 Pure hypercholesterolemia, unspecified: Secondary | ICD-10-CM | POA: Diagnosis not present

## 2022-10-19 DIAGNOSIS — Z7982 Long term (current) use of aspirin: Secondary | ICD-10-CM | POA: Diagnosis not present

## 2022-10-19 DIAGNOSIS — Z79899 Other long term (current) drug therapy: Secondary | ICD-10-CM | POA: Diagnosis not present

## 2022-10-19 DIAGNOSIS — J449 Chronic obstructive pulmonary disease, unspecified: Secondary | ICD-10-CM | POA: Diagnosis not present

## 2022-10-19 DIAGNOSIS — E1142 Type 2 diabetes mellitus with diabetic polyneuropathy: Secondary | ICD-10-CM | POA: Diagnosis not present

## 2022-10-19 DIAGNOSIS — K2100 Gastro-esophageal reflux disease with esophagitis, without bleeding: Secondary | ICD-10-CM | POA: Diagnosis not present

## 2022-10-19 DIAGNOSIS — Z86711 Personal history of pulmonary embolism: Secondary | ICD-10-CM | POA: Diagnosis not present

## 2022-10-19 DIAGNOSIS — S71002S Unspecified open wound, left hip, sequela: Secondary | ICD-10-CM | POA: Diagnosis not present

## 2022-10-19 DIAGNOSIS — Z7984 Long term (current) use of oral hypoglycemic drugs: Secondary | ICD-10-CM | POA: Diagnosis not present

## 2022-10-19 DIAGNOSIS — L89223 Pressure ulcer of left hip, stage 3: Secondary | ICD-10-CM | POA: Diagnosis not present

## 2022-10-19 DIAGNOSIS — M7989 Other specified soft tissue disorders: Secondary | ICD-10-CM | POA: Diagnosis not present

## 2022-10-19 DIAGNOSIS — Z87891 Personal history of nicotine dependence: Secondary | ICD-10-CM | POA: Diagnosis not present

## 2022-10-19 DIAGNOSIS — Z872 Personal history of diseases of the skin and subcutaneous tissue: Secondary | ICD-10-CM | POA: Diagnosis not present

## 2022-10-19 DIAGNOSIS — I10 Essential (primary) hypertension: Secondary | ICD-10-CM | POA: Diagnosis not present

## 2022-10-19 DIAGNOSIS — S71102S Unspecified open wound, left thigh, sequela: Secondary | ICD-10-CM | POA: Diagnosis not present

## 2022-10-19 DIAGNOSIS — L97521 Non-pressure chronic ulcer of other part of left foot limited to breakdown of skin: Secondary | ICD-10-CM | POA: Diagnosis not present

## 2022-10-19 DIAGNOSIS — E11621 Type 2 diabetes mellitus with foot ulcer: Secondary | ICD-10-CM | POA: Diagnosis not present

## 2022-10-19 DIAGNOSIS — Z66 Do not resuscitate: Secondary | ICD-10-CM | POA: Diagnosis not present

## 2022-10-21 DIAGNOSIS — E876 Hypokalemia: Secondary | ICD-10-CM | POA: Diagnosis not present

## 2022-10-21 DIAGNOSIS — E1142 Type 2 diabetes mellitus with diabetic polyneuropathy: Secondary | ICD-10-CM | POA: Diagnosis not present

## 2022-10-21 DIAGNOSIS — I10 Essential (primary) hypertension: Secondary | ICD-10-CM | POA: Diagnosis not present

## 2022-10-21 DIAGNOSIS — L89223 Pressure ulcer of left hip, stage 3: Secondary | ICD-10-CM | POA: Diagnosis not present

## 2022-10-21 DIAGNOSIS — G8220 Paraplegia, unspecified: Secondary | ICD-10-CM | POA: Diagnosis not present

## 2022-10-21 DIAGNOSIS — J449 Chronic obstructive pulmonary disease, unspecified: Secondary | ICD-10-CM | POA: Diagnosis not present

## 2022-10-24 DIAGNOSIS — E11621 Type 2 diabetes mellitus with foot ulcer: Secondary | ICD-10-CM | POA: Diagnosis not present

## 2022-10-24 DIAGNOSIS — K2100 Gastro-esophageal reflux disease with esophagitis, without bleeding: Secondary | ICD-10-CM | POA: Diagnosis not present

## 2022-10-24 DIAGNOSIS — M7989 Other specified soft tissue disorders: Secondary | ICD-10-CM | POA: Diagnosis not present

## 2022-10-24 DIAGNOSIS — E1142 Type 2 diabetes mellitus with diabetic polyneuropathy: Secondary | ICD-10-CM | POA: Diagnosis not present

## 2022-10-24 DIAGNOSIS — Z79899 Other long term (current) drug therapy: Secondary | ICD-10-CM | POA: Diagnosis not present

## 2022-10-24 DIAGNOSIS — E7800 Pure hypercholesterolemia, unspecified: Secondary | ICD-10-CM | POA: Diagnosis not present

## 2022-10-24 DIAGNOSIS — L97521 Non-pressure chronic ulcer of other part of left foot limited to breakdown of skin: Secondary | ICD-10-CM | POA: Diagnosis not present

## 2022-10-24 DIAGNOSIS — J449 Chronic obstructive pulmonary disease, unspecified: Secondary | ICD-10-CM | POA: Diagnosis not present

## 2022-10-24 DIAGNOSIS — Z7984 Long term (current) use of oral hypoglycemic drugs: Secondary | ICD-10-CM | POA: Diagnosis not present

## 2022-10-24 DIAGNOSIS — G8220 Paraplegia, unspecified: Secondary | ICD-10-CM | POA: Diagnosis not present

## 2022-10-24 DIAGNOSIS — Z86711 Personal history of pulmonary embolism: Secondary | ICD-10-CM | POA: Diagnosis not present

## 2022-10-24 DIAGNOSIS — I10 Essential (primary) hypertension: Secondary | ICD-10-CM | POA: Diagnosis not present

## 2022-10-24 DIAGNOSIS — Z87891 Personal history of nicotine dependence: Secondary | ICD-10-CM | POA: Diagnosis not present

## 2022-10-24 DIAGNOSIS — H04123 Dry eye syndrome of bilateral lacrimal glands: Secondary | ICD-10-CM | POA: Diagnosis not present

## 2022-10-24 DIAGNOSIS — Z7982 Long term (current) use of aspirin: Secondary | ICD-10-CM | POA: Diagnosis not present

## 2022-10-25 DIAGNOSIS — E1142 Type 2 diabetes mellitus with diabetic polyneuropathy: Secondary | ICD-10-CM | POA: Diagnosis not present

## 2022-10-25 DIAGNOSIS — G8220 Paraplegia, unspecified: Secondary | ICD-10-CM | POA: Diagnosis not present

## 2022-10-25 DIAGNOSIS — J449 Chronic obstructive pulmonary disease, unspecified: Secondary | ICD-10-CM | POA: Diagnosis not present

## 2022-10-25 DIAGNOSIS — I10 Essential (primary) hypertension: Secondary | ICD-10-CM | POA: Diagnosis not present

## 2022-10-25 DIAGNOSIS — E876 Hypokalemia: Secondary | ICD-10-CM | POA: Diagnosis not present

## 2022-10-25 DIAGNOSIS — L89223 Pressure ulcer of left hip, stage 3: Secondary | ICD-10-CM | POA: Diagnosis not present

## 2022-10-27 DIAGNOSIS — I10 Essential (primary) hypertension: Secondary | ICD-10-CM | POA: Diagnosis not present

## 2022-10-27 DIAGNOSIS — E876 Hypokalemia: Secondary | ICD-10-CM | POA: Diagnosis not present

## 2022-10-27 DIAGNOSIS — E1142 Type 2 diabetes mellitus with diabetic polyneuropathy: Secondary | ICD-10-CM | POA: Diagnosis not present

## 2022-10-27 DIAGNOSIS — J449 Chronic obstructive pulmonary disease, unspecified: Secondary | ICD-10-CM | POA: Diagnosis not present

## 2022-10-27 DIAGNOSIS — G8220 Paraplegia, unspecified: Secondary | ICD-10-CM | POA: Diagnosis not present

## 2022-10-27 DIAGNOSIS — L89223 Pressure ulcer of left hip, stage 3: Secondary | ICD-10-CM | POA: Diagnosis not present

## 2022-11-01 DIAGNOSIS — E1142 Type 2 diabetes mellitus with diabetic polyneuropathy: Secondary | ICD-10-CM | POA: Diagnosis not present

## 2022-11-01 DIAGNOSIS — I10 Essential (primary) hypertension: Secondary | ICD-10-CM | POA: Diagnosis not present

## 2022-11-01 DIAGNOSIS — E876 Hypokalemia: Secondary | ICD-10-CM | POA: Diagnosis not present

## 2022-11-01 DIAGNOSIS — L97521 Non-pressure chronic ulcer of other part of left foot limited to breakdown of skin: Secondary | ICD-10-CM | POA: Diagnosis not present

## 2022-11-01 DIAGNOSIS — G8220 Paraplegia, unspecified: Secondary | ICD-10-CM | POA: Diagnosis not present

## 2022-11-01 DIAGNOSIS — L89223 Pressure ulcer of left hip, stage 3: Secondary | ICD-10-CM | POA: Diagnosis not present

## 2022-11-01 DIAGNOSIS — J449 Chronic obstructive pulmonary disease, unspecified: Secondary | ICD-10-CM | POA: Diagnosis not present

## 2022-11-01 DIAGNOSIS — E11621 Type 2 diabetes mellitus with foot ulcer: Secondary | ICD-10-CM | POA: Diagnosis not present

## 2022-11-03 DIAGNOSIS — E1142 Type 2 diabetes mellitus with diabetic polyneuropathy: Secondary | ICD-10-CM | POA: Diagnosis not present

## 2022-11-03 DIAGNOSIS — E876 Hypokalemia: Secondary | ICD-10-CM | POA: Diagnosis not present

## 2022-11-03 DIAGNOSIS — G8220 Paraplegia, unspecified: Secondary | ICD-10-CM | POA: Diagnosis not present

## 2022-11-03 DIAGNOSIS — I10 Essential (primary) hypertension: Secondary | ICD-10-CM | POA: Diagnosis not present

## 2022-11-03 DIAGNOSIS — J449 Chronic obstructive pulmonary disease, unspecified: Secondary | ICD-10-CM | POA: Diagnosis not present

## 2022-11-03 DIAGNOSIS — L89223 Pressure ulcer of left hip, stage 3: Secondary | ICD-10-CM | POA: Diagnosis not present

## 2022-11-07 DIAGNOSIS — L89223 Pressure ulcer of left hip, stage 3: Secondary | ICD-10-CM | POA: Diagnosis not present

## 2022-11-07 DIAGNOSIS — I10 Essential (primary) hypertension: Secondary | ICD-10-CM | POA: Diagnosis not present

## 2022-11-07 DIAGNOSIS — E1142 Type 2 diabetes mellitus with diabetic polyneuropathy: Secondary | ICD-10-CM | POA: Diagnosis not present

## 2022-11-07 DIAGNOSIS — G8220 Paraplegia, unspecified: Secondary | ICD-10-CM | POA: Diagnosis not present

## 2022-11-07 DIAGNOSIS — J449 Chronic obstructive pulmonary disease, unspecified: Secondary | ICD-10-CM | POA: Diagnosis not present

## 2022-11-07 DIAGNOSIS — E876 Hypokalemia: Secondary | ICD-10-CM | POA: Diagnosis not present

## 2022-11-10 DIAGNOSIS — L89223 Pressure ulcer of left hip, stage 3: Secondary | ICD-10-CM | POA: Diagnosis not present

## 2022-11-10 DIAGNOSIS — J449 Chronic obstructive pulmonary disease, unspecified: Secondary | ICD-10-CM | POA: Diagnosis not present

## 2022-11-10 DIAGNOSIS — G8220 Paraplegia, unspecified: Secondary | ICD-10-CM | POA: Diagnosis not present

## 2022-11-10 DIAGNOSIS — E1142 Type 2 diabetes mellitus with diabetic polyneuropathy: Secondary | ICD-10-CM | POA: Diagnosis not present

## 2022-11-10 DIAGNOSIS — E876 Hypokalemia: Secondary | ICD-10-CM | POA: Diagnosis not present

## 2022-11-10 DIAGNOSIS — I10 Essential (primary) hypertension: Secondary | ICD-10-CM | POA: Diagnosis not present

## 2022-11-10 DIAGNOSIS — M4716 Other spondylosis with myelopathy, lumbar region: Secondary | ICD-10-CM | POA: Diagnosis not present

## 2022-11-15 DIAGNOSIS — E876 Hypokalemia: Secondary | ICD-10-CM | POA: Diagnosis not present

## 2022-11-15 DIAGNOSIS — I10 Essential (primary) hypertension: Secondary | ICD-10-CM | POA: Diagnosis not present

## 2022-11-15 DIAGNOSIS — G8220 Paraplegia, unspecified: Secondary | ICD-10-CM | POA: Diagnosis not present

## 2022-11-15 DIAGNOSIS — E1142 Type 2 diabetes mellitus with diabetic polyneuropathy: Secondary | ICD-10-CM | POA: Diagnosis not present

## 2022-11-15 DIAGNOSIS — L89223 Pressure ulcer of left hip, stage 3: Secondary | ICD-10-CM | POA: Diagnosis not present

## 2022-11-15 DIAGNOSIS — J449 Chronic obstructive pulmonary disease, unspecified: Secondary | ICD-10-CM | POA: Diagnosis not present

## 2022-11-18 DIAGNOSIS — L89223 Pressure ulcer of left hip, stage 3: Secondary | ICD-10-CM | POA: Diagnosis not present

## 2022-11-18 DIAGNOSIS — E1142 Type 2 diabetes mellitus with diabetic polyneuropathy: Secondary | ICD-10-CM | POA: Diagnosis not present

## 2022-11-18 DIAGNOSIS — G8220 Paraplegia, unspecified: Secondary | ICD-10-CM | POA: Diagnosis not present

## 2022-11-18 DIAGNOSIS — J449 Chronic obstructive pulmonary disease, unspecified: Secondary | ICD-10-CM | POA: Diagnosis not present

## 2022-11-18 DIAGNOSIS — I10 Essential (primary) hypertension: Secondary | ICD-10-CM | POA: Diagnosis not present

## 2022-11-18 DIAGNOSIS — E876 Hypokalemia: Secondary | ICD-10-CM | POA: Diagnosis not present

## 2022-11-22 DIAGNOSIS — G8220 Paraplegia, unspecified: Secondary | ICD-10-CM | POA: Diagnosis not present

## 2022-11-22 DIAGNOSIS — E1142 Type 2 diabetes mellitus with diabetic polyneuropathy: Secondary | ICD-10-CM | POA: Diagnosis not present

## 2022-11-22 DIAGNOSIS — I10 Essential (primary) hypertension: Secondary | ICD-10-CM | POA: Diagnosis not present

## 2022-11-22 DIAGNOSIS — E876 Hypokalemia: Secondary | ICD-10-CM | POA: Diagnosis not present

## 2022-11-22 DIAGNOSIS — L89223 Pressure ulcer of left hip, stage 3: Secondary | ICD-10-CM | POA: Diagnosis not present

## 2022-11-22 DIAGNOSIS — J449 Chronic obstructive pulmonary disease, unspecified: Secondary | ICD-10-CM | POA: Diagnosis not present

## 2022-11-23 DIAGNOSIS — K2100 Gastro-esophageal reflux disease with esophagitis, without bleeding: Secondary | ICD-10-CM | POA: Diagnosis not present

## 2022-11-23 DIAGNOSIS — L89229 Pressure ulcer of left hip, unspecified stage: Secondary | ICD-10-CM | POA: Diagnosis not present

## 2022-11-23 DIAGNOSIS — S71002S Unspecified open wound, left hip, sequela: Secondary | ICD-10-CM | POA: Diagnosis not present

## 2022-11-23 DIAGNOSIS — E7800 Pure hypercholesterolemia, unspecified: Secondary | ICD-10-CM | POA: Diagnosis not present

## 2022-11-23 DIAGNOSIS — L97529 Non-pressure chronic ulcer of other part of left foot with unspecified severity: Secondary | ICD-10-CM | POA: Diagnosis not present

## 2022-11-23 DIAGNOSIS — I10 Essential (primary) hypertension: Secondary | ICD-10-CM | POA: Diagnosis not present

## 2022-11-23 DIAGNOSIS — Z87891 Personal history of nicotine dependence: Secondary | ICD-10-CM | POA: Diagnosis not present

## 2022-11-23 DIAGNOSIS — E11621 Type 2 diabetes mellitus with foot ulcer: Secondary | ICD-10-CM | POA: Diagnosis not present

## 2022-11-23 DIAGNOSIS — E1142 Type 2 diabetes mellitus with diabetic polyneuropathy: Secondary | ICD-10-CM | POA: Diagnosis not present

## 2022-11-23 DIAGNOSIS — S71102S Unspecified open wound, left thigh, sequela: Secondary | ICD-10-CM | POA: Diagnosis not present

## 2022-11-23 DIAGNOSIS — Z7984 Long term (current) use of oral hypoglycemic drugs: Secondary | ICD-10-CM | POA: Diagnosis not present

## 2022-11-23 DIAGNOSIS — Z885 Allergy status to narcotic agent status: Secondary | ICD-10-CM | POA: Diagnosis not present

## 2022-11-23 DIAGNOSIS — G8310 Monoplegia of lower limb affecting unspecified side: Secondary | ICD-10-CM | POA: Diagnosis not present

## 2022-11-23 DIAGNOSIS — L89223 Pressure ulcer of left hip, stage 3: Secondary | ICD-10-CM | POA: Diagnosis not present

## 2022-11-23 DIAGNOSIS — E785 Hyperlipidemia, unspecified: Secondary | ICD-10-CM | POA: Diagnosis not present

## 2022-11-23 DIAGNOSIS — J449 Chronic obstructive pulmonary disease, unspecified: Secondary | ICD-10-CM | POA: Diagnosis not present

## 2022-11-24 DIAGNOSIS — J449 Chronic obstructive pulmonary disease, unspecified: Secondary | ICD-10-CM | POA: Diagnosis not present

## 2022-11-24 DIAGNOSIS — E1142 Type 2 diabetes mellitus with diabetic polyneuropathy: Secondary | ICD-10-CM | POA: Diagnosis not present

## 2022-11-24 DIAGNOSIS — I10 Essential (primary) hypertension: Secondary | ICD-10-CM | POA: Diagnosis not present

## 2022-11-24 DIAGNOSIS — E876 Hypokalemia: Secondary | ICD-10-CM | POA: Diagnosis not present

## 2022-11-24 DIAGNOSIS — G8220 Paraplegia, unspecified: Secondary | ICD-10-CM | POA: Diagnosis not present

## 2022-11-24 DIAGNOSIS — L89223 Pressure ulcer of left hip, stage 3: Secondary | ICD-10-CM | POA: Diagnosis not present

## 2022-11-29 DIAGNOSIS — L89223 Pressure ulcer of left hip, stage 3: Secondary | ICD-10-CM | POA: Diagnosis not present

## 2022-11-29 DIAGNOSIS — I10 Essential (primary) hypertension: Secondary | ICD-10-CM | POA: Diagnosis not present

## 2022-11-29 DIAGNOSIS — E1142 Type 2 diabetes mellitus with diabetic polyneuropathy: Secondary | ICD-10-CM | POA: Diagnosis not present

## 2022-11-29 DIAGNOSIS — G8220 Paraplegia, unspecified: Secondary | ICD-10-CM | POA: Diagnosis not present

## 2022-11-29 DIAGNOSIS — E876 Hypokalemia: Secondary | ICD-10-CM | POA: Diagnosis not present

## 2022-11-29 DIAGNOSIS — J449 Chronic obstructive pulmonary disease, unspecified: Secondary | ICD-10-CM | POA: Diagnosis not present

## 2022-12-02 DIAGNOSIS — J449 Chronic obstructive pulmonary disease, unspecified: Secondary | ICD-10-CM | POA: Diagnosis not present

## 2022-12-02 DIAGNOSIS — I10 Essential (primary) hypertension: Secondary | ICD-10-CM | POA: Diagnosis not present

## 2022-12-02 DIAGNOSIS — E1142 Type 2 diabetes mellitus with diabetic polyneuropathy: Secondary | ICD-10-CM | POA: Diagnosis not present

## 2022-12-02 DIAGNOSIS — E876 Hypokalemia: Secondary | ICD-10-CM | POA: Diagnosis not present

## 2022-12-02 DIAGNOSIS — G8220 Paraplegia, unspecified: Secondary | ICD-10-CM | POA: Diagnosis not present

## 2022-12-02 DIAGNOSIS — L89223 Pressure ulcer of left hip, stage 3: Secondary | ICD-10-CM | POA: Diagnosis not present

## 2022-12-06 DIAGNOSIS — L89223 Pressure ulcer of left hip, stage 3: Secondary | ICD-10-CM | POA: Diagnosis not present

## 2022-12-06 DIAGNOSIS — E876 Hypokalemia: Secondary | ICD-10-CM | POA: Diagnosis not present

## 2022-12-06 DIAGNOSIS — J449 Chronic obstructive pulmonary disease, unspecified: Secondary | ICD-10-CM | POA: Diagnosis not present

## 2022-12-06 DIAGNOSIS — G8220 Paraplegia, unspecified: Secondary | ICD-10-CM | POA: Diagnosis not present

## 2022-12-06 DIAGNOSIS — E1142 Type 2 diabetes mellitus with diabetic polyneuropathy: Secondary | ICD-10-CM | POA: Diagnosis not present

## 2022-12-06 DIAGNOSIS — I10 Essential (primary) hypertension: Secondary | ICD-10-CM | POA: Diagnosis not present

## 2022-12-08 DIAGNOSIS — E876 Hypokalemia: Secondary | ICD-10-CM | POA: Diagnosis not present

## 2022-12-08 DIAGNOSIS — G8220 Paraplegia, unspecified: Secondary | ICD-10-CM | POA: Diagnosis not present

## 2022-12-08 DIAGNOSIS — L89223 Pressure ulcer of left hip, stage 3: Secondary | ICD-10-CM | POA: Diagnosis not present

## 2022-12-08 DIAGNOSIS — J449 Chronic obstructive pulmonary disease, unspecified: Secondary | ICD-10-CM | POA: Diagnosis not present

## 2022-12-08 DIAGNOSIS — E1142 Type 2 diabetes mellitus with diabetic polyneuropathy: Secondary | ICD-10-CM | POA: Diagnosis not present

## 2022-12-08 DIAGNOSIS — I10 Essential (primary) hypertension: Secondary | ICD-10-CM | POA: Diagnosis not present

## 2022-12-09 DIAGNOSIS — J449 Chronic obstructive pulmonary disease, unspecified: Secondary | ICD-10-CM | POA: Diagnosis not present

## 2022-12-09 DIAGNOSIS — M4716 Other spondylosis with myelopathy, lumbar region: Secondary | ICD-10-CM | POA: Diagnosis not present

## 2022-12-13 DIAGNOSIS — Z79899 Other long term (current) drug therapy: Secondary | ICD-10-CM | POA: Diagnosis not present

## 2022-12-13 DIAGNOSIS — E876 Hypokalemia: Secondary | ICD-10-CM | POA: Diagnosis not present

## 2022-12-13 DIAGNOSIS — E1142 Type 2 diabetes mellitus with diabetic polyneuropathy: Secondary | ICD-10-CM | POA: Diagnosis not present

## 2022-12-13 DIAGNOSIS — E785 Hyperlipidemia, unspecified: Secondary | ICD-10-CM | POA: Diagnosis not present

## 2022-12-13 DIAGNOSIS — J449 Chronic obstructive pulmonary disease, unspecified: Secondary | ICD-10-CM | POA: Diagnosis not present

## 2022-12-13 DIAGNOSIS — Z0000 Encounter for general adult medical examination without abnormal findings: Secondary | ICD-10-CM | POA: Diagnosis not present

## 2022-12-13 DIAGNOSIS — Z299 Encounter for prophylactic measures, unspecified: Secondary | ICD-10-CM | POA: Diagnosis not present

## 2022-12-13 DIAGNOSIS — L89223 Pressure ulcer of left hip, stage 3: Secondary | ICD-10-CM | POA: Diagnosis not present

## 2022-12-13 DIAGNOSIS — I700 Atherosclerosis of aorta: Secondary | ICD-10-CM | POA: Diagnosis not present

## 2022-12-13 DIAGNOSIS — L89893 Pressure ulcer of other site, stage 3: Secondary | ICD-10-CM | POA: Diagnosis not present

## 2022-12-13 DIAGNOSIS — I10 Essential (primary) hypertension: Secondary | ICD-10-CM | POA: Diagnosis not present

## 2022-12-13 DIAGNOSIS — G8220 Paraplegia, unspecified: Secondary | ICD-10-CM | POA: Diagnosis not present

## 2022-12-13 DIAGNOSIS — R5383 Other fatigue: Secondary | ICD-10-CM | POA: Diagnosis not present

## 2022-12-16 DIAGNOSIS — J449 Chronic obstructive pulmonary disease, unspecified: Secondary | ICD-10-CM | POA: Diagnosis not present

## 2022-12-16 DIAGNOSIS — L89223 Pressure ulcer of left hip, stage 3: Secondary | ICD-10-CM | POA: Diagnosis not present

## 2022-12-16 DIAGNOSIS — E876 Hypokalemia: Secondary | ICD-10-CM | POA: Diagnosis not present

## 2022-12-16 DIAGNOSIS — E1142 Type 2 diabetes mellitus with diabetic polyneuropathy: Secondary | ICD-10-CM | POA: Diagnosis not present

## 2022-12-16 DIAGNOSIS — I10 Essential (primary) hypertension: Secondary | ICD-10-CM | POA: Diagnosis not present

## 2022-12-16 DIAGNOSIS — G8220 Paraplegia, unspecified: Secondary | ICD-10-CM | POA: Diagnosis not present

## 2022-12-20 DIAGNOSIS — G8220 Paraplegia, unspecified: Secondary | ICD-10-CM | POA: Diagnosis not present

## 2022-12-20 DIAGNOSIS — L89223 Pressure ulcer of left hip, stage 3: Secondary | ICD-10-CM | POA: Diagnosis not present

## 2022-12-20 DIAGNOSIS — J449 Chronic obstructive pulmonary disease, unspecified: Secondary | ICD-10-CM | POA: Diagnosis not present

## 2022-12-20 DIAGNOSIS — I10 Essential (primary) hypertension: Secondary | ICD-10-CM | POA: Diagnosis not present

## 2022-12-20 DIAGNOSIS — E1142 Type 2 diabetes mellitus with diabetic polyneuropathy: Secondary | ICD-10-CM | POA: Diagnosis not present

## 2022-12-20 DIAGNOSIS — E876 Hypokalemia: Secondary | ICD-10-CM | POA: Diagnosis not present

## 2022-12-23 DIAGNOSIS — J449 Chronic obstructive pulmonary disease, unspecified: Secondary | ICD-10-CM | POA: Diagnosis not present

## 2022-12-23 DIAGNOSIS — E876 Hypokalemia: Secondary | ICD-10-CM | POA: Diagnosis not present

## 2022-12-23 DIAGNOSIS — L89223 Pressure ulcer of left hip, stage 3: Secondary | ICD-10-CM | POA: Diagnosis not present

## 2022-12-23 DIAGNOSIS — E1142 Type 2 diabetes mellitus with diabetic polyneuropathy: Secondary | ICD-10-CM | POA: Diagnosis not present

## 2022-12-23 DIAGNOSIS — G8220 Paraplegia, unspecified: Secondary | ICD-10-CM | POA: Diagnosis not present

## 2022-12-23 DIAGNOSIS — I10 Essential (primary) hypertension: Secondary | ICD-10-CM | POA: Diagnosis not present

## 2022-12-27 DIAGNOSIS — E876 Hypokalemia: Secondary | ICD-10-CM | POA: Diagnosis not present

## 2022-12-27 DIAGNOSIS — L89223 Pressure ulcer of left hip, stage 3: Secondary | ICD-10-CM | POA: Diagnosis not present

## 2022-12-27 DIAGNOSIS — J449 Chronic obstructive pulmonary disease, unspecified: Secondary | ICD-10-CM | POA: Diagnosis not present

## 2022-12-27 DIAGNOSIS — I10 Essential (primary) hypertension: Secondary | ICD-10-CM | POA: Diagnosis not present

## 2022-12-27 DIAGNOSIS — G8220 Paraplegia, unspecified: Secondary | ICD-10-CM | POA: Diagnosis not present

## 2022-12-27 DIAGNOSIS — E1142 Type 2 diabetes mellitus with diabetic polyneuropathy: Secondary | ICD-10-CM | POA: Diagnosis not present

## 2022-12-30 DIAGNOSIS — E876 Hypokalemia: Secondary | ICD-10-CM | POA: Diagnosis not present

## 2022-12-30 DIAGNOSIS — L89223 Pressure ulcer of left hip, stage 3: Secondary | ICD-10-CM | POA: Diagnosis not present

## 2022-12-30 DIAGNOSIS — J449 Chronic obstructive pulmonary disease, unspecified: Secondary | ICD-10-CM | POA: Diagnosis not present

## 2022-12-30 DIAGNOSIS — I10 Essential (primary) hypertension: Secondary | ICD-10-CM | POA: Diagnosis not present

## 2022-12-30 DIAGNOSIS — G8220 Paraplegia, unspecified: Secondary | ICD-10-CM | POA: Diagnosis not present

## 2022-12-30 DIAGNOSIS — E1142 Type 2 diabetes mellitus with diabetic polyneuropathy: Secondary | ICD-10-CM | POA: Diagnosis not present

## 2023-01-03 DIAGNOSIS — G9520 Unspecified cord compression: Secondary | ICD-10-CM | POA: Diagnosis not present

## 2023-01-03 DIAGNOSIS — M4804 Spinal stenosis, thoracic region: Secondary | ICD-10-CM | POA: Diagnosis not present

## 2023-01-03 DIAGNOSIS — Z79891 Long term (current) use of opiate analgesic: Secondary | ICD-10-CM | POA: Diagnosis not present

## 2023-01-03 DIAGNOSIS — M48062 Spinal stenosis, lumbar region with neurogenic claudication: Secondary | ICD-10-CM | POA: Diagnosis not present

## 2023-01-03 DIAGNOSIS — G8220 Paraplegia, unspecified: Secondary | ICD-10-CM | POA: Diagnosis not present

## 2023-01-03 DIAGNOSIS — L89223 Pressure ulcer of left hip, stage 3: Secondary | ICD-10-CM | POA: Diagnosis not present

## 2023-01-03 DIAGNOSIS — E1142 Type 2 diabetes mellitus with diabetic polyneuropathy: Secondary | ICD-10-CM | POA: Diagnosis not present

## 2023-01-03 DIAGNOSIS — G894 Chronic pain syndrome: Secondary | ICD-10-CM | POA: Diagnosis not present

## 2023-01-03 DIAGNOSIS — J449 Chronic obstructive pulmonary disease, unspecified: Secondary | ICD-10-CM | POA: Diagnosis not present

## 2023-01-03 DIAGNOSIS — E876 Hypokalemia: Secondary | ICD-10-CM | POA: Diagnosis not present

## 2023-01-03 DIAGNOSIS — I10 Essential (primary) hypertension: Secondary | ICD-10-CM | POA: Diagnosis not present

## 2023-01-04 DIAGNOSIS — Z7984 Long term (current) use of oral hypoglycemic drugs: Secondary | ICD-10-CM | POA: Diagnosis not present

## 2023-01-04 DIAGNOSIS — Z86711 Personal history of pulmonary embolism: Secondary | ICD-10-CM | POA: Diagnosis not present

## 2023-01-04 DIAGNOSIS — K2100 Gastro-esophageal reflux disease with esophagitis, without bleeding: Secondary | ICD-10-CM | POA: Diagnosis not present

## 2023-01-04 DIAGNOSIS — Z87891 Personal history of nicotine dependence: Secondary | ICD-10-CM | POA: Diagnosis not present

## 2023-01-04 DIAGNOSIS — S71102S Unspecified open wound, left thigh, sequela: Secondary | ICD-10-CM | POA: Diagnosis not present

## 2023-01-04 DIAGNOSIS — I10 Essential (primary) hypertension: Secondary | ICD-10-CM | POA: Diagnosis not present

## 2023-01-04 DIAGNOSIS — S71002S Unspecified open wound, left hip, sequela: Secondary | ICD-10-CM | POA: Diagnosis not present

## 2023-01-04 DIAGNOSIS — Z885 Allergy status to narcotic agent status: Secondary | ICD-10-CM | POA: Diagnosis not present

## 2023-01-04 DIAGNOSIS — Z79899 Other long term (current) drug therapy: Secondary | ICD-10-CM | POA: Diagnosis not present

## 2023-01-04 DIAGNOSIS — E1142 Type 2 diabetes mellitus with diabetic polyneuropathy: Secondary | ICD-10-CM | POA: Diagnosis not present

## 2023-01-04 DIAGNOSIS — E7800 Pure hypercholesterolemia, unspecified: Secondary | ICD-10-CM | POA: Diagnosis not present

## 2023-01-04 DIAGNOSIS — J449 Chronic obstructive pulmonary disease, unspecified: Secondary | ICD-10-CM | POA: Diagnosis not present

## 2023-01-04 DIAGNOSIS — S71002A Unspecified open wound, left hip, initial encounter: Secondary | ICD-10-CM | POA: Diagnosis not present

## 2023-01-05 DIAGNOSIS — I10 Essential (primary) hypertension: Secondary | ICD-10-CM | POA: Diagnosis not present

## 2023-01-05 DIAGNOSIS — J449 Chronic obstructive pulmonary disease, unspecified: Secondary | ICD-10-CM | POA: Diagnosis not present

## 2023-01-05 DIAGNOSIS — E876 Hypokalemia: Secondary | ICD-10-CM | POA: Diagnosis not present

## 2023-01-05 DIAGNOSIS — L89223 Pressure ulcer of left hip, stage 3: Secondary | ICD-10-CM | POA: Diagnosis not present

## 2023-01-05 DIAGNOSIS — G8220 Paraplegia, unspecified: Secondary | ICD-10-CM | POA: Diagnosis not present

## 2023-01-05 DIAGNOSIS — E1142 Type 2 diabetes mellitus with diabetic polyneuropathy: Secondary | ICD-10-CM | POA: Diagnosis not present

## 2023-01-09 DIAGNOSIS — M4716 Other spondylosis with myelopathy, lumbar region: Secondary | ICD-10-CM | POA: Diagnosis not present

## 2023-01-09 DIAGNOSIS — J449 Chronic obstructive pulmonary disease, unspecified: Secondary | ICD-10-CM | POA: Diagnosis not present

## 2023-01-10 DIAGNOSIS — E876 Hypokalemia: Secondary | ICD-10-CM | POA: Diagnosis not present

## 2023-01-10 DIAGNOSIS — J449 Chronic obstructive pulmonary disease, unspecified: Secondary | ICD-10-CM | POA: Diagnosis not present

## 2023-01-10 DIAGNOSIS — E1142 Type 2 diabetes mellitus with diabetic polyneuropathy: Secondary | ICD-10-CM | POA: Diagnosis not present

## 2023-01-10 DIAGNOSIS — I10 Essential (primary) hypertension: Secondary | ICD-10-CM | POA: Diagnosis not present

## 2023-01-10 DIAGNOSIS — G8220 Paraplegia, unspecified: Secondary | ICD-10-CM | POA: Diagnosis not present

## 2023-01-10 DIAGNOSIS — L89223 Pressure ulcer of left hip, stage 3: Secondary | ICD-10-CM | POA: Diagnosis not present

## 2023-01-13 DIAGNOSIS — I10 Essential (primary) hypertension: Secondary | ICD-10-CM | POA: Diagnosis not present

## 2023-01-13 DIAGNOSIS — G8220 Paraplegia, unspecified: Secondary | ICD-10-CM | POA: Diagnosis not present

## 2023-01-13 DIAGNOSIS — L89223 Pressure ulcer of left hip, stage 3: Secondary | ICD-10-CM | POA: Diagnosis not present

## 2023-01-13 DIAGNOSIS — E1142 Type 2 diabetes mellitus with diabetic polyneuropathy: Secondary | ICD-10-CM | POA: Diagnosis not present

## 2023-01-13 DIAGNOSIS — J449 Chronic obstructive pulmonary disease, unspecified: Secondary | ICD-10-CM | POA: Diagnosis not present

## 2023-01-13 DIAGNOSIS — E876 Hypokalemia: Secondary | ICD-10-CM | POA: Diagnosis not present

## 2023-01-17 DIAGNOSIS — I10 Essential (primary) hypertension: Secondary | ICD-10-CM | POA: Diagnosis not present

## 2023-01-17 DIAGNOSIS — E1142 Type 2 diabetes mellitus with diabetic polyneuropathy: Secondary | ICD-10-CM | POA: Diagnosis not present

## 2023-01-17 DIAGNOSIS — E876 Hypokalemia: Secondary | ICD-10-CM | POA: Diagnosis not present

## 2023-01-17 DIAGNOSIS — L89223 Pressure ulcer of left hip, stage 3: Secondary | ICD-10-CM | POA: Diagnosis not present

## 2023-01-17 DIAGNOSIS — J449 Chronic obstructive pulmonary disease, unspecified: Secondary | ICD-10-CM | POA: Diagnosis not present

## 2023-01-17 DIAGNOSIS — G8220 Paraplegia, unspecified: Secondary | ICD-10-CM | POA: Diagnosis not present

## 2023-01-19 DIAGNOSIS — G8220 Paraplegia, unspecified: Secondary | ICD-10-CM | POA: Diagnosis not present

## 2023-01-19 DIAGNOSIS — L89223 Pressure ulcer of left hip, stage 3: Secondary | ICD-10-CM | POA: Diagnosis not present

## 2023-01-19 DIAGNOSIS — E876 Hypokalemia: Secondary | ICD-10-CM | POA: Diagnosis not present

## 2023-01-19 DIAGNOSIS — I10 Essential (primary) hypertension: Secondary | ICD-10-CM | POA: Diagnosis not present

## 2023-01-19 DIAGNOSIS — E1142 Type 2 diabetes mellitus with diabetic polyneuropathy: Secondary | ICD-10-CM | POA: Diagnosis not present

## 2023-01-19 DIAGNOSIS — J449 Chronic obstructive pulmonary disease, unspecified: Secondary | ICD-10-CM | POA: Diagnosis not present

## 2023-01-24 DIAGNOSIS — G8220 Paraplegia, unspecified: Secondary | ICD-10-CM | POA: Diagnosis not present

## 2023-01-24 DIAGNOSIS — J449 Chronic obstructive pulmonary disease, unspecified: Secondary | ICD-10-CM | POA: Diagnosis not present

## 2023-01-24 DIAGNOSIS — I10 Essential (primary) hypertension: Secondary | ICD-10-CM | POA: Diagnosis not present

## 2023-01-24 DIAGNOSIS — E876 Hypokalemia: Secondary | ICD-10-CM | POA: Diagnosis not present

## 2023-01-24 DIAGNOSIS — E1142 Type 2 diabetes mellitus with diabetic polyneuropathy: Secondary | ICD-10-CM | POA: Diagnosis not present

## 2023-01-24 DIAGNOSIS — L89223 Pressure ulcer of left hip, stage 3: Secondary | ICD-10-CM | POA: Diagnosis not present

## 2023-01-27 DIAGNOSIS — L89223 Pressure ulcer of left hip, stage 3: Secondary | ICD-10-CM | POA: Diagnosis not present

## 2023-01-27 DIAGNOSIS — I10 Essential (primary) hypertension: Secondary | ICD-10-CM | POA: Diagnosis not present

## 2023-01-27 DIAGNOSIS — E1142 Type 2 diabetes mellitus with diabetic polyneuropathy: Secondary | ICD-10-CM | POA: Diagnosis not present

## 2023-01-27 DIAGNOSIS — J449 Chronic obstructive pulmonary disease, unspecified: Secondary | ICD-10-CM | POA: Diagnosis not present

## 2023-01-27 DIAGNOSIS — G8220 Paraplegia, unspecified: Secondary | ICD-10-CM | POA: Diagnosis not present

## 2023-01-27 DIAGNOSIS — E876 Hypokalemia: Secondary | ICD-10-CM | POA: Diagnosis not present

## 2023-01-31 DIAGNOSIS — E876 Hypokalemia: Secondary | ICD-10-CM | POA: Diagnosis not present

## 2023-01-31 DIAGNOSIS — E1142 Type 2 diabetes mellitus with diabetic polyneuropathy: Secondary | ICD-10-CM | POA: Diagnosis not present

## 2023-01-31 DIAGNOSIS — J449 Chronic obstructive pulmonary disease, unspecified: Secondary | ICD-10-CM | POA: Diagnosis not present

## 2023-01-31 DIAGNOSIS — G8220 Paraplegia, unspecified: Secondary | ICD-10-CM | POA: Diagnosis not present

## 2023-01-31 DIAGNOSIS — I10 Essential (primary) hypertension: Secondary | ICD-10-CM | POA: Diagnosis not present

## 2023-01-31 DIAGNOSIS — L89223 Pressure ulcer of left hip, stage 3: Secondary | ICD-10-CM | POA: Diagnosis not present

## 2023-02-02 DIAGNOSIS — J449 Chronic obstructive pulmonary disease, unspecified: Secondary | ICD-10-CM | POA: Diagnosis not present

## 2023-02-02 DIAGNOSIS — G8220 Paraplegia, unspecified: Secondary | ICD-10-CM | POA: Diagnosis not present

## 2023-02-02 DIAGNOSIS — E876 Hypokalemia: Secondary | ICD-10-CM | POA: Diagnosis not present

## 2023-02-02 DIAGNOSIS — L89223 Pressure ulcer of left hip, stage 3: Secondary | ICD-10-CM | POA: Diagnosis not present

## 2023-02-02 DIAGNOSIS — E1142 Type 2 diabetes mellitus with diabetic polyneuropathy: Secondary | ICD-10-CM | POA: Diagnosis not present

## 2023-02-02 DIAGNOSIS — I10 Essential (primary) hypertension: Secondary | ICD-10-CM | POA: Diagnosis not present

## 2023-02-07 DIAGNOSIS — E876 Hypokalemia: Secondary | ICD-10-CM | POA: Diagnosis not present

## 2023-02-07 DIAGNOSIS — L89223 Pressure ulcer of left hip, stage 3: Secondary | ICD-10-CM | POA: Diagnosis not present

## 2023-02-07 DIAGNOSIS — J449 Chronic obstructive pulmonary disease, unspecified: Secondary | ICD-10-CM | POA: Diagnosis not present

## 2023-02-07 DIAGNOSIS — I10 Essential (primary) hypertension: Secondary | ICD-10-CM | POA: Diagnosis not present

## 2023-02-07 DIAGNOSIS — E1142 Type 2 diabetes mellitus with diabetic polyneuropathy: Secondary | ICD-10-CM | POA: Diagnosis not present

## 2023-02-07 DIAGNOSIS — G8220 Paraplegia, unspecified: Secondary | ICD-10-CM | POA: Diagnosis not present

## 2023-02-08 DIAGNOSIS — J449 Chronic obstructive pulmonary disease, unspecified: Secondary | ICD-10-CM | POA: Diagnosis not present

## 2023-02-08 DIAGNOSIS — M4716 Other spondylosis with myelopathy, lumbar region: Secondary | ICD-10-CM | POA: Diagnosis not present

## 2023-02-10 DIAGNOSIS — E876 Hypokalemia: Secondary | ICD-10-CM | POA: Diagnosis not present

## 2023-02-10 DIAGNOSIS — G8220 Paraplegia, unspecified: Secondary | ICD-10-CM | POA: Diagnosis not present

## 2023-02-10 DIAGNOSIS — L89223 Pressure ulcer of left hip, stage 3: Secondary | ICD-10-CM | POA: Diagnosis not present

## 2023-02-10 DIAGNOSIS — E1142 Type 2 diabetes mellitus with diabetic polyneuropathy: Secondary | ICD-10-CM | POA: Diagnosis not present

## 2023-02-10 DIAGNOSIS — J449 Chronic obstructive pulmonary disease, unspecified: Secondary | ICD-10-CM | POA: Diagnosis not present

## 2023-02-10 DIAGNOSIS — I10 Essential (primary) hypertension: Secondary | ICD-10-CM | POA: Diagnosis not present

## 2023-02-13 ENCOUNTER — Encounter: Payer: Self-pay | Admitting: "Endocrinology

## 2023-02-13 ENCOUNTER — Ambulatory Visit: Payer: Medicare Other | Admitting: "Endocrinology

## 2023-02-13 VITALS — BP 138/70 | HR 60

## 2023-02-13 DIAGNOSIS — E119 Type 2 diabetes mellitus without complications: Secondary | ICD-10-CM | POA: Diagnosis not present

## 2023-02-13 DIAGNOSIS — I10 Essential (primary) hypertension: Secondary | ICD-10-CM | POA: Diagnosis not present

## 2023-02-13 DIAGNOSIS — Z794 Long term (current) use of insulin: Secondary | ICD-10-CM | POA: Diagnosis not present

## 2023-02-13 DIAGNOSIS — E782 Mixed hyperlipidemia: Secondary | ICD-10-CM

## 2023-02-13 LAB — POCT GLYCOSYLATED HEMOGLOBIN (HGB A1C)

## 2023-02-13 NOTE — Progress Notes (Signed)
02/13/2023, 8:22 PM  Endocrinology follow-up note  Subjective:    Patient ID: Patrick Aguilar, male    DOB: [birth year].  Patrick Aguilar is being seen in follow-up after he was seen in consultation for management of currently controlled asymptomatic diabetes requested by  Kirstie Peri, MD.   Past Medical History:  Diagnosis Date   AKI (acute kidney injury) (HCC) 12/2017   Angio-edema    Anxiety    Arthritis    Complication of anesthesia    low respirations, low BP   COPD (chronic obstructive pulmonary disease) (HCC)    DDD (degenerative disc disease), lumbar    Diabetes mellitus without complication (HCC)    type 2   Diastolic dysfunction 12/14/2017   Moderate noted on ECHO   Fournier's gangrene in male    GERD (gastroesophageal reflux disease)    History of ARDS    History of necrotizing fasciitis    Severe   Hypertension    Lumbar spondylosis    Numbness and tingling of both upper extremities    Pulmonary hypertension (HCC) 12/14/2017   Mild, noted on ECHO   Recurrent upper respiratory infection (URI)    Respiratory failure, acute (HCC) 12/2017   Septic shock (HCC) 12/2017   Shortness of breath dyspnea    Testicular pain, left    Wears glasses     Past Surgical History:  Procedure Laterality Date   ADENOIDECTOMY     APPENDECTOMY     APPLICATION OF A-CELL OF CHEST/ABDOMEN N/A 01/08/2018   Procedure: APPLICATION OF A-CELL OF GROIN;  Surgeon: Peggye Form, DO;  Location: [facility];  Service: Plastics;  Laterality: N/A;   APPLICATION OF A-CELL OF EXTREMITY N/A 12/25/2017   Procedure: APPLICATION OF A-CELL;  Surgeon: Peggye Form, DO;  Location: [facility];  Service: Plastics;  Laterality: N/A;   BACK SURGERY     x5   CARDIAC CATHETERIZATION  2006   neg   CERVICAL DISC SURGERY     x2   COLONOSCOPY     DEBRIDEMENT AND CLOSURE WOUND N/A 01/26/2018   Procedure: REVISION OF PERINEUM WOUND WITH DEBRIDEMENT, PARTIAL CLOSURE OF  PERINEUM, PLACEMENT OF CELLERATE COLLAGEN;  Surgeon: Peggye Form, DO;  Location: [facility];  Service: Plastics;  Laterality: N/A;   DENTAL SURGERY     teeth extractions   groin wound  01/08/2018   : EXCISION OF GROIN WOUND WITH PLACEMENT OF ACELL, AND PRIMARY WOUND CLOSURE (N/A Scrotum)   I & D EXTREMITY N/A 03/12/2018   Procedure: IRRIGATION AND DEBRIDEMENT PERIMUM WOUND WITH CLOSURE;  Surgeon: Peggye Form, DO;  Location: [facility];  Service: Plastics;  Laterality: N/A;   INCISION AND DRAINAGE ABSCESS Left 07/11/2018   Procedure: INCISION AND DRAINAGE ABSCESS LEFT THIGH;  Surgeon: Marcine Matar, MD;  Location: [facility];  Service: Urology;  Laterality: Left;   INCISION AND DRAINAGE OF WOUND N/A 12/25/2017   Procedure: Irrigation and debridement of Fournier's of scrotum with placement of testes in subcutaneous thigh pockets and Acell placement;  Surgeon: Peggye Form, DO;  Location: [facility];  Service: Plastics;  Laterality: N/A;   INCISION AND DRAINAGE OF WOUND N/A 01/08/2018   Procedure: EXCISION OF GROIN WOUND WITH PLACEMENT OF ACELL, AND PRIMARY WOUND CLOSURE;  Surgeon: Peggye Form, DO;  Location: [facility];  Service: Plastics;  Laterality: N/A;   ORCHIECTOMY N/A 12/13/2017   Procedure: EXCISION OF SCROTUM AND DEBRIDEMENT OF PENIS;  Surgeon: Retta Diones,  Jeannett Senior, MD;  Location: [facility];  Service: Urology;  Laterality: N/A;   ORCHIECTOMY Left 06/22/2018   Procedure: ORCHIECTOMY;  Surgeon: Marcine Matar, MD;  Location[facility];  Service: Urology;  Laterality: Left;   PLANTAR FASCIA SURGERY Bilateral    shoulders Bilateral    rotator cuff   SUBMANDIBULAR GLAND EXCISION Left 03/12/2015   Procedure: LEFT SUBMANDIBULAR GLAND RESECTION;  Surgeon: Christia Reading, MD;  Location: [facility] OR;  Service: ENT;  Laterality: Left;   TONSILLECTOMY     TOTAL HIP ARTHROPLASTY Right 03/06/2019   Procedure: TOTAL HIP ARTHROPLASTY ANTERIOR APPROACH;  Surgeon: Ollen Gross, MD;  Location: [facility];  Service: Orthopedics;  Laterality: Right;     Social History   Socioeconomic History   Marital status: Single    Spouse name: Not on file   Number of children: Not on file   Years of education: Not on file   Highest education level: Not on file  Occupational History   Not on file  Tobacco Use   Smoking status: Former    Packs/day: 1.00    Years: 50.00    Additional pack years: 0.00    Total pack years: 50.00    Types: Cigarettes    Quit date: 12/13/2016    Years since quitting: 6.1   Smokeless tobacco: Never  Vaping Use   Vaping Use: Never used  Substance and Sexual Activity   Alcohol use: No    Comment: quit alcohol 80's   Drug use: No   Sexual activity: Not on file  Other Topics Concern   Not on file  Social History Narrative   Not on file   Social Determinants of Health   Financial Resource Strain: Not on file  Food Insecurity: Not on file  Transportation Needs: Not on file  Physical Activity: Not on file  Stress: Not on file  Social Connections: Not on file    Family History  Problem Relation Age of Onset   Lung cancer Mother    Bladder Cancer Father    Allergic rhinitis Neg Hx    Asthma Neg Hx    Eczema Neg Hx    Urticaria Neg Hx     Outpatient Encounter Medications as of 02/13/2023  Medication Sig   albuterol (PROVENTIL) (2.5 MG/3ML) 0.083% nebulizer solution Inhale 2.5 mg into the lungs every 6 (six) hours as needed for shortness of breath or wheezing.   albuterol (VENTOLIN HFA) 108 (90 Base) MCG/ACT inhaler Inhale 2 puffs into the lungs every 6 (six) hours as needed for wheezing or shortness of breath.   aspirin 81 MG EC tablet Take by mouth.   atenolol-chlorthalidone (TENORETIC) 50-25 MG tablet Take 1 tablet by mouth daily with breakfast.    blood glucose meter kit and supplies KIT Dispense based on patient and insurance preference. Use up to four times daily as directed. (FOR ICD-9 250.00, 250.01).   celecoxib  (CELEBREX) 200 MG capsule Take 1 capsule by mouth daily.   doxazosin (CARDURA XL) 8 MG 24 hr tablet Take by mouth.   famotidine (PEPCID) 20 MG tablet Take 20 mg by mouth 2 (two) times daily.   furosemide (LASIX) 20 MG tablet Take 20 mg by mouth daily after breakfast.    gabapentin (NEURONTIN) 800 MG tablet Take 800 mg by mouth every 6 (six) hours.    Insulin Pen Needle (BD PEN NEEDLE NANO U/F) 32G X 4 MM MISC 1 each by Does not apply route 4 (four) times daily. (  Patient not taking: Reported on 10/12/2022)   metFORMIN (GLUCOPHAGE) 500 MG tablet Take 500 mg by mouth once.   oxyCODONE-acetaminophen (PERCOCET) 10-325 MG tablet Take 1 tablet by mouth every 6 (six) hours.   OXYGEN Inhale 1-3 L into the lungs.   pantoprazole (PROTONIX) 40 MG tablet Take 40 mg by mouth daily.   potassium chloride (KLOR-CON) 20 MEQ packet Take by mouth.   rosuvastatin (CRESTOR) 5 MG tablet Take 5 mg by mouth every Monday.   sulfamethoxazole-trimethoprim (BACTRIM) 400-80 MG tablet Take 1 tablet by mouth 2 (two) times daily.   traZODone (DESYREL) 100 MG tablet Take 100 mg by mouth at bedtime.   [DISCONTINUED] glipiZIDE (GLUCOTROL) 5 MG tablet Take 1 tablet (5 mg total) by mouth 2 (two) times daily.   No facility-administered encounter medications on file as of 02/13/2023.    ALLERGIES: Allergies  Allergen Reactions   Tramadol Other (See Comments)    tremor   Adenosine     can't tolerate  05/2005   Bupropion     Nausea   Morphine And Codeine Itching    VACCINATION STATUS: Immunization History  Administered Date(s) Administered   Td,absorbed, Preservative Free, Adult Use, Lf Unspecified 03/05/2007   Tdap 08/10/2017   Zoster Recombinat (Shingrix) 08/10/2017    Diabetes He presents for his follow-up diabetic visit. He has type 2 diabetes mellitus. Onset time: She was diagnosed at approximate age of 37 years. His disease course has been improving. There are no hypoglycemic associated symptoms. Pertinent negatives  for hypoglycemia include no confusion, headaches, pallor or seizures. There are no diabetic associated symptoms. Pertinent negatives for diabetes include no chest pain, no fatigue, no polydipsia, no polyphagia, no polyuria and no weakness. There are no hypoglycemic complications. Symptoms are improving. Diabetic complications include nephropathy. Risk factors for coronary artery disease include diabetes mellitus, dyslipidemia, hypertension, male sex and sedentary lifestyle. Current diabetic treatments: Is currently on Metformin 500 mg p.o. once  daily, glipizide 5 mg po BID. His weight is fluctuating minimally. He is following a generally unhealthy diet. When asked about meal planning, he reported none. He has not had a previous visit with a dietitian. He participates in exercise intermittently. His home blood glucose trend is decreasing steadily. His overall blood glucose range is 140-180 mg/dl. (His presents with his meter showing average blood glucose of 148-163 for the last 14 days.  His point-of-care A1c is 5.6%, denies any hypoglycemia.  He remains only on metformin 500 mg p.o. once a day, glipizide 5 mg p.o. once a day.    ) An ACE inhibitor/angiotensin II receptor blocker is being taken.  Hyperlipidemia This is a chronic problem. The current episode started more than 1 year ago. Exacerbating diseases include diabetes. Pertinent negatives include no chest pain, myalgias or shortness of breath. Current antihyperlipidemic treatment includes statins. Risk factors for coronary artery disease include diabetes mellitus, dyslipidemia, male sex, hypertension and a sedentary lifestyle.  Hypertension This is a chronic problem. The current episode started more than 1 year ago. The problem is controlled. Pertinent negatives include no chest pain, headaches, neck pain, palpitations or shortness of breath. Risk factors for coronary artery disease include diabetes mellitus, dyslipidemia, sedentary lifestyle and  smoking/tobacco exposure. Past treatments include beta blockers and diuretics. Hypertensive end-organ damage includes heart failure.    Review of Systems  Constitutional:  Negative for chills, fatigue, fever and unexpected weight change.  HENT:  Negative for dental problem, mouth sores and trouble swallowing.   Eyes:  Negative for  visual disturbance.  Respiratory:  Negative for cough, choking, chest tightness, shortness of breath and wheezing.   Cardiovascular:  Negative for chest pain, palpitations and leg swelling.  Gastrointestinal:  Negative for abdominal distention, abdominal pain, constipation, diarrhea, nausea and vomiting.  Endocrine: Negative for polydipsia, polyphagia and polyuria.  Genitourinary:  Negative for dysuria, flank pain, hematuria and urgency.  Musculoskeletal:  Positive for gait problem. Negative for back pain, myalgias and neck pain.       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   Skin:  Negative for pallor, rash and wound.  Neurological:  Negative for seizures, syncope, weakness, numbness and headaches.  Psychiatric/Behavioral:  Negative for confusion and dysphoric mood.     Objective:       02/13/2023    3:01 PM 10/12/2022    2:56 PM 09/08/2022    3:45 AM  Vitals with BMI  Systolic 138 130 161  Diastolic 70 82 74  Pulse 60 56 67    BP 138/70   Pulse 60   Wt Readings from Last 3 Encounters:  09/07/22 182 lb 15.7 oz (83 kg)  10/12/21 183 lb (83 kg)  08/16/21 183 lb 3.2 oz (83.1 kg)       CMP ( most recent) CMP     Component Value Date/Time   NA 134 (L) 09/07/2022 2033   NA 140 04/06/2022 1129   K 3.4 (L) 09/07/2022 2033   CL 98 09/07/2022 2033   CO2 24 09/07/2022 2033   GLUCOSE 139 (H) 09/07/2022 2033   BUN 49 (H) 09/07/2022 2033   BUN 25 04/06/2022 1129   CREATININE 1.40 (H) 09/07/2022 2033   CREATININE 1.13 05/13/2020 0000   CALCIUM 9.1 09/07/2022 2033   PROT 6.6 04/06/2022 1129   ALBUMIN 3.9 04/06/2022 1129   AST 12  04/06/2022 1129   ALT 13 04/06/2022 1129   ALKPHOS 80 04/06/2022 1129   BILITOT 0.3 04/06/2022 1129   GFRNONAA 54 (L) 09/07/2022 2033   GFRNONAA 66 05/13/2020 0000   GFRAA 76 05/13/2020 0000     Diabetic Labs (most recent): Lab Results  Component Value Date   HGBA1C 5.7 10/12/2022   HGBA1C 6.0 04/11/2022   HGBA1C 5.9 08/04/2021   MICROALBUR <0.2 05/13/2020     Lipid Panel ( most recent) Lipid Panel     Component Value Date/Time   CHOL 130 04/06/2022 1129   TRIG 120 04/06/2022 1129   HDL 46 04/06/2022 1129   CHOLHDL 2.8 04/06/2022 1129   LDLCALC 62 04/06/2022 1129   LABVLDL 22 04/06/2022 1129     Assessment & Plan:   1. Type 2 diabetes mellitus without complication, without long-term current use of insulin (HCC)  - TAVION SEELIGER has currently controlled asymptomatic type 2 DM since  72 years of age.   His presents with his meter showing average blood glucose of 148-163 for the last 14 days.  His point-of-care A1c is 5.6%, denies any hypoglycemia.  He remains only on metformin 500 mg p.o. once a day, glipizide 5 mg p.o. once a day.    - I had a long discussion with him about the progressive nature of diabetes and the pathology behind its complications. -his diabetes is complicated by  hx of  heavy smoking, deconditioning/sedentary life, diastolic dysfunction and he remains at a high risk for more acute and chronic complications which include CAD, CVA, CKD, retinopathy, and neuropathy. These are all discussed in detail with him.  - I have  counseled him on diet  and weight management  by adopting a carbohydrate restricted/protein rich diet. Patient is encouraged to switch to  unprocessed or minimally processed     complex starch and increased protein intake (animal or plant source), fruits, and vegetables. -  he is advised to stick to a routine mealtimes to eat 3 meals  a day and avoid unnecessary snacks ( to snack only to correct hypoglycemia).   - he acknowledges that  there is a room for improvement in his food and drink choices. - Suggestion is made for him to avoid simple carbohydrates  from his diet including Cakes, Sweet Desserts, Ice Cream, Soda (diet and regular), Sweet Tea, Candies, Chips, Cookies, Store Bought Juices, Alcohol in Excess of  1-2 drinks a day, Artificial Sweeteners,  Coffee Creamer, and "Sugar-free" Products, Lemonade. This will help patient to have more stable blood glucose profile and potentially avoid unintended weight gain.  - he will be scheduled with Norm Salt, RDN, CDE for diabetes education.  - I have approached him with the following individualized plan to manage  his diabetes and patient agrees:   -In light of his presentation with A1c of 5.6%, he is advised to discontinue glipizide at this time.  He is advised to continue metformin 500 mg p.o. once a day at breakfast.  He is benefiting from lifestyle nutrition discussed with him.  He was willing to monitor blood glucose twice a day-daily before breakfast and at bedtime.       He is not a candidate for SGLT2 inhibitors due to his prior history of Fournier's gangrene.  - Specific targets for  A1c;  LDL, HDL,  and Triglycerides were discussed with the patient.  2) Blood Pressure /Hypertension:  -His blood pressure is controlled to target.  He is advised to discontinue amlodipine.  He is advised to continue  atenolol/HCTZ mg p.o. daily with breakfast .  3) Lipids/Hyperlipidemia:   Review of his recent lipid panel showed improving LDL of 62.  He is advised to continue Crestor 5 mg p.o. nightly. Side effects and precautions discussed with him.    4)  Weight/Diet:  There is no height or weight on file to calculate BMI.   He is not a candidate for major weight loss.  I discussed with him the fact that loss of 5 - 10% of his  current body weight will have the most impact on his diabetes management.  Exercise, and detailed carbohydrates information provided  -  detailed on  discharge instructions.  5) Chronic Care/Health Maintenance:  -he  is on Statin medications and  is encouraged to initiate and continue to follow up with Ophthalmology, Dentist,  Podiatrist at least yearly or according to recommendations, and advised to   stay away from smoking. I have recommended yearly flu vaccine and pneumonia vaccine at least every 5 years; moderate intensity exercise for up to 150 minutes weekly; and  sleep for at least 7 hours a day.  He was found to have superficial cellulitis on his left big toe.  Patient does not have any sensation on his lower extremities.  He will benefit from early intervention.  I discussed and initiated short course low-dose Bactrim 400-800 mg p.o. twice daily for 7 days.  He is advised to seek care at his PCP if he does not see significant improvement or if it is getting worse in 7 days.     - he is  advised to maintain close follow up with Sherryll Burger,  Beatrix Fetters, MD for primary care needs, as well as his other providers for optimal and coordinated care.   I spent  26  minutes in the care of the patient today including review of labs from CMP, Lipids, Thyroid Function, Hematology (current and previous including abstractions from other facilities); face-to-face time discussing  his blood glucose readings/logs, discussing hypoglycemia and hyperglycemia episodes and symptoms, medications doses, his options of short and long term treatment based on the latest standards of care / guidelines;  discussion about incorporating lifestyle medicine;  and documenting the encounter. Risk reduction counseling performed per USPSTF guidelines to reduce  obesity and cardiovascular risk factors.     Please refer to Patient Instructions for Blood Glucose Monitoring and Insulin/Medications Dosing Guide"  in media tab for additional information. Please  also refer to " Patient Self Inventory" in the Media  tab for reviewed elements of pertinent patient history.  Tish Frederickson  participated in the discussions, expressed understanding, and voiced agreement with the above plans.  All questions were answered to his satisfaction. he is encouraged to contact clinic should he have any questions or concerns prior to his return visit.    Follow up plan: - Return in about 6 months (around 08/15/2023) for A1c -NV.  Marquis Lunch, MD [facility] [address] [place], [place] [address] Phone: [phone]  Fax: [phone]    02/13/2023, 8:22 PM  This note was partially dictated with voice recognition software. Similar sounding words can be transcribed inadequately or may not  be corrected upon review.

## 2023-02-13 NOTE — Patient Instructions (Signed)

## 2023-02-14 DIAGNOSIS — E1142 Type 2 diabetes mellitus with diabetic polyneuropathy: Secondary | ICD-10-CM | POA: Diagnosis not present

## 2023-02-14 DIAGNOSIS — L89223 Pressure ulcer of left hip, stage 3: Secondary | ICD-10-CM | POA: Diagnosis not present

## 2023-02-14 DIAGNOSIS — G8220 Paraplegia, unspecified: Secondary | ICD-10-CM | POA: Diagnosis not present

## 2023-02-14 DIAGNOSIS — E876 Hypokalemia: Secondary | ICD-10-CM | POA: Diagnosis not present

## 2023-02-14 DIAGNOSIS — I10 Essential (primary) hypertension: Secondary | ICD-10-CM | POA: Diagnosis not present

## 2023-02-14 DIAGNOSIS — J449 Chronic obstructive pulmonary disease, unspecified: Secondary | ICD-10-CM | POA: Diagnosis not present

## 2023-02-15 DIAGNOSIS — Z7982 Long term (current) use of aspirin: Secondary | ICD-10-CM | POA: Diagnosis not present

## 2023-02-15 DIAGNOSIS — I10 Essential (primary) hypertension: Secondary | ICD-10-CM | POA: Diagnosis not present

## 2023-02-15 DIAGNOSIS — G8220 Paraplegia, unspecified: Secondary | ICD-10-CM | POA: Diagnosis not present

## 2023-02-15 DIAGNOSIS — Z87891 Personal history of nicotine dependence: Secondary | ICD-10-CM | POA: Diagnosis not present

## 2023-02-15 DIAGNOSIS — Z86711 Personal history of pulmonary embolism: Secondary | ICD-10-CM | POA: Diagnosis not present

## 2023-02-15 DIAGNOSIS — E1142 Type 2 diabetes mellitus with diabetic polyneuropathy: Secondary | ICD-10-CM | POA: Diagnosis not present

## 2023-02-15 DIAGNOSIS — J449 Chronic obstructive pulmonary disease, unspecified: Secondary | ICD-10-CM | POA: Diagnosis not present

## 2023-02-15 DIAGNOSIS — M459 Ankylosing spondylitis of unspecified sites in spine: Secondary | ICD-10-CM | POA: Diagnosis not present

## 2023-02-15 DIAGNOSIS — L89229 Pressure ulcer of left hip, unspecified stage: Secondary | ICD-10-CM | POA: Diagnosis not present

## 2023-02-15 DIAGNOSIS — Z7984 Long term (current) use of oral hypoglycemic drugs: Secondary | ICD-10-CM | POA: Diagnosis not present

## 2023-02-15 DIAGNOSIS — E7800 Pure hypercholesterolemia, unspecified: Secondary | ICD-10-CM | POA: Diagnosis not present

## 2023-02-15 DIAGNOSIS — Z79899 Other long term (current) drug therapy: Secondary | ICD-10-CM | POA: Diagnosis not present

## 2023-02-15 DIAGNOSIS — K2100 Gastro-esophageal reflux disease with esophagitis, without bleeding: Secondary | ICD-10-CM | POA: Diagnosis not present

## 2023-02-16 DIAGNOSIS — I10 Essential (primary) hypertension: Secondary | ICD-10-CM | POA: Diagnosis not present

## 2023-02-16 DIAGNOSIS — G8220 Paraplegia, unspecified: Secondary | ICD-10-CM | POA: Diagnosis not present

## 2023-02-16 DIAGNOSIS — E1142 Type 2 diabetes mellitus with diabetic polyneuropathy: Secondary | ICD-10-CM | POA: Diagnosis not present

## 2023-02-16 DIAGNOSIS — J449 Chronic obstructive pulmonary disease, unspecified: Secondary | ICD-10-CM | POA: Diagnosis not present

## 2023-02-16 DIAGNOSIS — L89223 Pressure ulcer of left hip, stage 3: Secondary | ICD-10-CM | POA: Diagnosis not present

## 2023-02-16 DIAGNOSIS — E876 Hypokalemia: Secondary | ICD-10-CM | POA: Diagnosis not present

## 2023-02-21 DIAGNOSIS — E876 Hypokalemia: Secondary | ICD-10-CM | POA: Diagnosis not present

## 2023-02-21 DIAGNOSIS — J449 Chronic obstructive pulmonary disease, unspecified: Secondary | ICD-10-CM | POA: Diagnosis not present

## 2023-02-21 DIAGNOSIS — I10 Essential (primary) hypertension: Secondary | ICD-10-CM | POA: Diagnosis not present

## 2023-02-21 DIAGNOSIS — L89223 Pressure ulcer of left hip, stage 3: Secondary | ICD-10-CM | POA: Diagnosis not present

## 2023-02-21 DIAGNOSIS — G8220 Paraplegia, unspecified: Secondary | ICD-10-CM | POA: Diagnosis not present

## 2023-02-21 DIAGNOSIS — E1142 Type 2 diabetes mellitus with diabetic polyneuropathy: Secondary | ICD-10-CM | POA: Diagnosis not present

## 2023-02-22 DIAGNOSIS — Z299 Encounter for prophylactic measures, unspecified: Secondary | ICD-10-CM | POA: Diagnosis not present

## 2023-02-22 DIAGNOSIS — E1165 Type 2 diabetes mellitus with hyperglycemia: Secondary | ICD-10-CM | POA: Diagnosis not present

## 2023-02-22 DIAGNOSIS — I10 Essential (primary) hypertension: Secondary | ICD-10-CM | POA: Diagnosis not present

## 2023-02-22 DIAGNOSIS — J441 Chronic obstructive pulmonary disease with (acute) exacerbation: Secondary | ICD-10-CM | POA: Diagnosis not present

## 2023-02-22 LAB — BASIC METABOLIC PANEL
BUN: 33 — AB (ref 4–21)
CO2: 29 — AB (ref 13–22)
Chloride: 100 (ref 99–108)
Creatinine: 1 (ref 0.6–1.3)
Creatinine: 1 (ref 0.6–1.3)
Glucose: 143
Potassium: 4 mEq/L (ref 3.5–5.1)
Sodium: 143 (ref 137–147)

## 2023-02-22 LAB — COMPREHENSIVE METABOLIC PANEL
Calcium: 9.7 (ref 8.7–10.7)
Calcium: 9.7 (ref 8.7–10.7)

## 2023-02-24 DIAGNOSIS — G8220 Paraplegia, unspecified: Secondary | ICD-10-CM | POA: Diagnosis not present

## 2023-02-24 DIAGNOSIS — E1142 Type 2 diabetes mellitus with diabetic polyneuropathy: Secondary | ICD-10-CM | POA: Diagnosis not present

## 2023-02-24 DIAGNOSIS — I10 Essential (primary) hypertension: Secondary | ICD-10-CM | POA: Diagnosis not present

## 2023-02-24 DIAGNOSIS — E876 Hypokalemia: Secondary | ICD-10-CM | POA: Diagnosis not present

## 2023-02-24 DIAGNOSIS — J449 Chronic obstructive pulmonary disease, unspecified: Secondary | ICD-10-CM | POA: Diagnosis not present

## 2023-02-24 DIAGNOSIS — L89223 Pressure ulcer of left hip, stage 3: Secondary | ICD-10-CM | POA: Diagnosis not present

## 2023-02-28 DIAGNOSIS — I10 Essential (primary) hypertension: Secondary | ICD-10-CM | POA: Diagnosis not present

## 2023-02-28 DIAGNOSIS — J449 Chronic obstructive pulmonary disease, unspecified: Secondary | ICD-10-CM | POA: Diagnosis not present

## 2023-02-28 DIAGNOSIS — L89223 Pressure ulcer of left hip, stage 3: Secondary | ICD-10-CM | POA: Diagnosis not present

## 2023-02-28 DIAGNOSIS — L89893 Pressure ulcer of other site, stage 3: Secondary | ICD-10-CM | POA: Diagnosis not present

## 2023-02-28 DIAGNOSIS — E1142 Type 2 diabetes mellitus with diabetic polyneuropathy: Secondary | ICD-10-CM | POA: Diagnosis not present

## 2023-02-28 DIAGNOSIS — G8220 Paraplegia, unspecified: Secondary | ICD-10-CM | POA: Diagnosis not present

## 2023-02-28 DIAGNOSIS — E876 Hypokalemia: Secondary | ICD-10-CM | POA: Diagnosis not present

## 2023-03-01 ENCOUNTER — Encounter: Payer: Self-pay | Admitting: Nurse Practitioner

## 2023-03-03 DIAGNOSIS — I10 Essential (primary) hypertension: Secondary | ICD-10-CM | POA: Diagnosis not present

## 2023-03-03 DIAGNOSIS — E876 Hypokalemia: Secondary | ICD-10-CM | POA: Diagnosis not present

## 2023-03-03 DIAGNOSIS — J449 Chronic obstructive pulmonary disease, unspecified: Secondary | ICD-10-CM | POA: Diagnosis not present

## 2023-03-03 DIAGNOSIS — L89223 Pressure ulcer of left hip, stage 3: Secondary | ICD-10-CM | POA: Diagnosis not present

## 2023-03-03 DIAGNOSIS — E1142 Type 2 diabetes mellitus with diabetic polyneuropathy: Secondary | ICD-10-CM | POA: Diagnosis not present

## 2023-03-03 DIAGNOSIS — G8220 Paraplegia, unspecified: Secondary | ICD-10-CM | POA: Diagnosis not present

## 2023-03-07 DIAGNOSIS — I10 Essential (primary) hypertension: Secondary | ICD-10-CM | POA: Diagnosis not present

## 2023-03-07 DIAGNOSIS — L89223 Pressure ulcer of left hip, stage 3: Secondary | ICD-10-CM | POA: Diagnosis not present

## 2023-03-07 DIAGNOSIS — G8220 Paraplegia, unspecified: Secondary | ICD-10-CM | POA: Diagnosis not present

## 2023-03-07 DIAGNOSIS — J449 Chronic obstructive pulmonary disease, unspecified: Secondary | ICD-10-CM | POA: Diagnosis not present

## 2023-03-07 DIAGNOSIS — E876 Hypokalemia: Secondary | ICD-10-CM | POA: Diagnosis not present

## 2023-03-07 DIAGNOSIS — E1142 Type 2 diabetes mellitus with diabetic polyneuropathy: Secondary | ICD-10-CM | POA: Diagnosis not present

## 2023-03-10 DIAGNOSIS — E1142 Type 2 diabetes mellitus with diabetic polyneuropathy: Secondary | ICD-10-CM | POA: Diagnosis not present

## 2023-03-10 DIAGNOSIS — J449 Chronic obstructive pulmonary disease, unspecified: Secondary | ICD-10-CM | POA: Diagnosis not present

## 2023-03-10 DIAGNOSIS — L89223 Pressure ulcer of left hip, stage 3: Secondary | ICD-10-CM | POA: Diagnosis not present

## 2023-03-10 DIAGNOSIS — E876 Hypokalemia: Secondary | ICD-10-CM | POA: Diagnosis not present

## 2023-03-10 DIAGNOSIS — I10 Essential (primary) hypertension: Secondary | ICD-10-CM | POA: Diagnosis not present

## 2023-03-10 DIAGNOSIS — G8220 Paraplegia, unspecified: Secondary | ICD-10-CM | POA: Diagnosis not present

## 2023-03-14 DIAGNOSIS — J449 Chronic obstructive pulmonary disease, unspecified: Secondary | ICD-10-CM | POA: Diagnosis not present

## 2023-03-14 DIAGNOSIS — L89223 Pressure ulcer of left hip, stage 3: Secondary | ICD-10-CM | POA: Diagnosis not present

## 2023-03-14 DIAGNOSIS — I10 Essential (primary) hypertension: Secondary | ICD-10-CM | POA: Diagnosis not present

## 2023-03-14 DIAGNOSIS — E1142 Type 2 diabetes mellitus with diabetic polyneuropathy: Secondary | ICD-10-CM | POA: Diagnosis not present

## 2023-03-14 DIAGNOSIS — G8220 Paraplegia, unspecified: Secondary | ICD-10-CM | POA: Diagnosis not present

## 2023-03-14 DIAGNOSIS — E876 Hypokalemia: Secondary | ICD-10-CM | POA: Diagnosis not present

## 2023-03-21 DIAGNOSIS — J449 Chronic obstructive pulmonary disease, unspecified: Secondary | ICD-10-CM | POA: Diagnosis not present

## 2023-03-21 DIAGNOSIS — L89223 Pressure ulcer of left hip, stage 3: Secondary | ICD-10-CM | POA: Diagnosis not present

## 2023-03-21 DIAGNOSIS — G8220 Paraplegia, unspecified: Secondary | ICD-10-CM | POA: Diagnosis not present

## 2023-03-21 DIAGNOSIS — I10 Essential (primary) hypertension: Secondary | ICD-10-CM | POA: Diagnosis not present

## 2023-03-21 DIAGNOSIS — E1142 Type 2 diabetes mellitus with diabetic polyneuropathy: Secondary | ICD-10-CM | POA: Diagnosis not present

## 2023-03-21 DIAGNOSIS — E876 Hypokalemia: Secondary | ICD-10-CM | POA: Diagnosis not present

## 2023-03-24 DIAGNOSIS — J449 Chronic obstructive pulmonary disease, unspecified: Secondary | ICD-10-CM | POA: Diagnosis not present

## 2023-03-24 DIAGNOSIS — L89223 Pressure ulcer of left hip, stage 3: Secondary | ICD-10-CM | POA: Diagnosis not present

## 2023-03-24 DIAGNOSIS — I10 Essential (primary) hypertension: Secondary | ICD-10-CM | POA: Diagnosis not present

## 2023-03-24 DIAGNOSIS — E876 Hypokalemia: Secondary | ICD-10-CM | POA: Diagnosis not present

## 2023-03-24 DIAGNOSIS — G8220 Paraplegia, unspecified: Secondary | ICD-10-CM | POA: Diagnosis not present

## 2023-03-24 DIAGNOSIS — E1142 Type 2 diabetes mellitus with diabetic polyneuropathy: Secondary | ICD-10-CM | POA: Diagnosis not present

## 2023-03-25 DIAGNOSIS — S71102A Unspecified open wound, left thigh, initial encounter: Secondary | ICD-10-CM | POA: Diagnosis not present

## 2023-03-28 DIAGNOSIS — L89223 Pressure ulcer of left hip, stage 3: Secondary | ICD-10-CM | POA: Diagnosis not present

## 2023-03-28 DIAGNOSIS — E1142 Type 2 diabetes mellitus with diabetic polyneuropathy: Secondary | ICD-10-CM | POA: Diagnosis not present

## 2023-03-28 DIAGNOSIS — G8220 Paraplegia, unspecified: Secondary | ICD-10-CM | POA: Diagnosis not present

## 2023-03-28 DIAGNOSIS — I10 Essential (primary) hypertension: Secondary | ICD-10-CM | POA: Diagnosis not present

## 2023-03-28 DIAGNOSIS — J449 Chronic obstructive pulmonary disease, unspecified: Secondary | ICD-10-CM | POA: Diagnosis not present

## 2023-03-28 DIAGNOSIS — E876 Hypokalemia: Secondary | ICD-10-CM | POA: Diagnosis not present

## 2023-03-30 DIAGNOSIS — L89223 Pressure ulcer of left hip, stage 3: Secondary | ICD-10-CM | POA: Diagnosis not present

## 2023-03-30 DIAGNOSIS — G8220 Paraplegia, unspecified: Secondary | ICD-10-CM | POA: Diagnosis not present

## 2023-03-30 DIAGNOSIS — E1142 Type 2 diabetes mellitus with diabetic polyneuropathy: Secondary | ICD-10-CM | POA: Diagnosis not present

## 2023-03-30 DIAGNOSIS — J449 Chronic obstructive pulmonary disease, unspecified: Secondary | ICD-10-CM | POA: Diagnosis not present

## 2023-03-30 DIAGNOSIS — E876 Hypokalemia: Secondary | ICD-10-CM | POA: Diagnosis not present

## 2023-03-30 DIAGNOSIS — I10 Essential (primary) hypertension: Secondary | ICD-10-CM | POA: Diagnosis not present

## 2023-04-04 DIAGNOSIS — G8220 Paraplegia, unspecified: Secondary | ICD-10-CM | POA: Diagnosis not present

## 2023-04-04 DIAGNOSIS — M4714 Other spondylosis with myelopathy, thoracic region: Secondary | ICD-10-CM | POA: Diagnosis not present

## 2023-04-04 DIAGNOSIS — Z7984 Long term (current) use of oral hypoglycemic drugs: Secondary | ICD-10-CM | POA: Diagnosis not present

## 2023-04-04 DIAGNOSIS — G894 Chronic pain syndrome: Secondary | ICD-10-CM | POA: Diagnosis not present

## 2023-04-04 DIAGNOSIS — Z79891 Long term (current) use of opiate analgesic: Secondary | ICD-10-CM | POA: Diagnosis not present

## 2023-04-04 DIAGNOSIS — L89223 Pressure ulcer of left hip, stage 3: Secondary | ICD-10-CM | POA: Diagnosis not present

## 2023-04-04 DIAGNOSIS — M4804 Spinal stenosis, thoracic region: Secondary | ICD-10-CM | POA: Diagnosis not present

## 2023-04-04 DIAGNOSIS — M48062 Spinal stenosis, lumbar region with neurogenic claudication: Secondary | ICD-10-CM | POA: Diagnosis not present

## 2023-04-04 DIAGNOSIS — J449 Chronic obstructive pulmonary disease, unspecified: Secondary | ICD-10-CM | POA: Diagnosis not present

## 2023-04-04 DIAGNOSIS — E1142 Type 2 diabetes mellitus with diabetic polyneuropathy: Secondary | ICD-10-CM | POA: Diagnosis not present

## 2023-04-04 DIAGNOSIS — I10 Essential (primary) hypertension: Secondary | ICD-10-CM | POA: Diagnosis not present

## 2023-04-05 DIAGNOSIS — Z7984 Long term (current) use of oral hypoglycemic drugs: Secondary | ICD-10-CM | POA: Diagnosis not present

## 2023-04-05 DIAGNOSIS — L97521 Non-pressure chronic ulcer of other part of left foot limited to breakdown of skin: Secondary | ICD-10-CM | POA: Diagnosis not present

## 2023-04-05 DIAGNOSIS — S71002S Unspecified open wound, left hip, sequela: Secondary | ICD-10-CM | POA: Diagnosis not present

## 2023-04-05 DIAGNOSIS — J449 Chronic obstructive pulmonary disease, unspecified: Secondary | ICD-10-CM | POA: Diagnosis not present

## 2023-04-05 DIAGNOSIS — S71102S Unspecified open wound, left thigh, sequela: Secondary | ICD-10-CM | POA: Diagnosis not present

## 2023-04-05 DIAGNOSIS — E11621 Type 2 diabetes mellitus with foot ulcer: Secondary | ICD-10-CM | POA: Diagnosis not present

## 2023-04-05 DIAGNOSIS — Z87891 Personal history of nicotine dependence: Secondary | ICD-10-CM | POA: Diagnosis not present

## 2023-04-05 DIAGNOSIS — I700 Atherosclerosis of aorta: Secondary | ICD-10-CM | POA: Diagnosis not present

## 2023-04-05 DIAGNOSIS — Z79899 Other long term (current) drug therapy: Secondary | ICD-10-CM | POA: Diagnosis not present

## 2023-04-05 DIAGNOSIS — Z299 Encounter for prophylactic measures, unspecified: Secondary | ICD-10-CM | POA: Diagnosis not present

## 2023-04-05 DIAGNOSIS — Z7189 Other specified counseling: Secondary | ICD-10-CM | POA: Diagnosis not present

## 2023-04-05 DIAGNOSIS — E785 Hyperlipidemia, unspecified: Secondary | ICD-10-CM | POA: Diagnosis not present

## 2023-04-05 DIAGNOSIS — I10 Essential (primary) hypertension: Secondary | ICD-10-CM | POA: Diagnosis not present

## 2023-04-05 DIAGNOSIS — Z0000 Encounter for general adult medical examination without abnormal findings: Secondary | ICD-10-CM | POA: Diagnosis not present

## 2023-04-07 DIAGNOSIS — J449 Chronic obstructive pulmonary disease, unspecified: Secondary | ICD-10-CM | POA: Diagnosis not present

## 2023-04-07 DIAGNOSIS — E1142 Type 2 diabetes mellitus with diabetic polyneuropathy: Secondary | ICD-10-CM | POA: Diagnosis not present

## 2023-04-07 DIAGNOSIS — G8220 Paraplegia, unspecified: Secondary | ICD-10-CM | POA: Diagnosis not present

## 2023-04-07 DIAGNOSIS — L89223 Pressure ulcer of left hip, stage 3: Secondary | ICD-10-CM | POA: Diagnosis not present

## 2023-04-07 DIAGNOSIS — Z7984 Long term (current) use of oral hypoglycemic drugs: Secondary | ICD-10-CM | POA: Diagnosis not present

## 2023-04-07 DIAGNOSIS — I10 Essential (primary) hypertension: Secondary | ICD-10-CM | POA: Diagnosis not present

## 2023-04-10 DIAGNOSIS — E1142 Type 2 diabetes mellitus with diabetic polyneuropathy: Secondary | ICD-10-CM | POA: Diagnosis not present

## 2023-04-10 DIAGNOSIS — G8220 Paraplegia, unspecified: Secondary | ICD-10-CM | POA: Diagnosis not present

## 2023-04-10 DIAGNOSIS — I10 Essential (primary) hypertension: Secondary | ICD-10-CM | POA: Diagnosis not present

## 2023-04-10 DIAGNOSIS — J449 Chronic obstructive pulmonary disease, unspecified: Secondary | ICD-10-CM | POA: Diagnosis not present

## 2023-04-10 DIAGNOSIS — Z7984 Long term (current) use of oral hypoglycemic drugs: Secondary | ICD-10-CM | POA: Diagnosis not present

## 2023-04-10 DIAGNOSIS — L89223 Pressure ulcer of left hip, stage 3: Secondary | ICD-10-CM | POA: Diagnosis not present

## 2023-04-12 DIAGNOSIS — Z7984 Long term (current) use of oral hypoglycemic drugs: Secondary | ICD-10-CM | POA: Diagnosis not present

## 2023-04-12 DIAGNOSIS — Z87891 Personal history of nicotine dependence: Secondary | ICD-10-CM | POA: Diagnosis not present

## 2023-04-12 DIAGNOSIS — Z79899 Other long term (current) drug therapy: Secondary | ICD-10-CM | POA: Diagnosis not present

## 2023-04-12 DIAGNOSIS — S71102S Unspecified open wound, left thigh, sequela: Secondary | ICD-10-CM | POA: Diagnosis not present

## 2023-04-12 DIAGNOSIS — E785 Hyperlipidemia, unspecified: Secondary | ICD-10-CM | POA: Diagnosis not present

## 2023-04-12 DIAGNOSIS — S71002S Unspecified open wound, left hip, sequela: Secondary | ICD-10-CM | POA: Diagnosis not present

## 2023-04-12 DIAGNOSIS — I10 Essential (primary) hypertension: Secondary | ICD-10-CM | POA: Diagnosis not present

## 2023-04-12 DIAGNOSIS — J449 Chronic obstructive pulmonary disease, unspecified: Secondary | ICD-10-CM | POA: Diagnosis not present

## 2023-04-12 DIAGNOSIS — L97521 Non-pressure chronic ulcer of other part of left foot limited to breakdown of skin: Secondary | ICD-10-CM | POA: Diagnosis not present

## 2023-04-12 DIAGNOSIS — E11621 Type 2 diabetes mellitus with foot ulcer: Secondary | ICD-10-CM | POA: Diagnosis not present

## 2023-04-13 DIAGNOSIS — Z7984 Long term (current) use of oral hypoglycemic drugs: Secondary | ICD-10-CM | POA: Diagnosis not present

## 2023-04-13 DIAGNOSIS — L89223 Pressure ulcer of left hip, stage 3: Secondary | ICD-10-CM | POA: Diagnosis not present

## 2023-04-13 DIAGNOSIS — I10 Essential (primary) hypertension: Secondary | ICD-10-CM | POA: Diagnosis not present

## 2023-04-13 DIAGNOSIS — E1142 Type 2 diabetes mellitus with diabetic polyneuropathy: Secondary | ICD-10-CM | POA: Diagnosis not present

## 2023-04-13 DIAGNOSIS — J449 Chronic obstructive pulmonary disease, unspecified: Secondary | ICD-10-CM | POA: Diagnosis not present

## 2023-04-13 DIAGNOSIS — G8220 Paraplegia, unspecified: Secondary | ICD-10-CM | POA: Diagnosis not present

## 2023-04-18 DIAGNOSIS — E1142 Type 2 diabetes mellitus with diabetic polyneuropathy: Secondary | ICD-10-CM | POA: Diagnosis not present

## 2023-04-18 DIAGNOSIS — L89223 Pressure ulcer of left hip, stage 3: Secondary | ICD-10-CM | POA: Diagnosis not present

## 2023-04-18 DIAGNOSIS — J449 Chronic obstructive pulmonary disease, unspecified: Secondary | ICD-10-CM | POA: Diagnosis not present

## 2023-04-18 DIAGNOSIS — Z7984 Long term (current) use of oral hypoglycemic drugs: Secondary | ICD-10-CM | POA: Diagnosis not present

## 2023-04-18 DIAGNOSIS — I10 Essential (primary) hypertension: Secondary | ICD-10-CM | POA: Diagnosis not present

## 2023-04-18 DIAGNOSIS — G8220 Paraplegia, unspecified: Secondary | ICD-10-CM | POA: Diagnosis not present

## 2023-04-19 DIAGNOSIS — S71002S Unspecified open wound, left hip, sequela: Secondary | ICD-10-CM | POA: Diagnosis not present

## 2023-04-19 DIAGNOSIS — J449 Chronic obstructive pulmonary disease, unspecified: Secondary | ICD-10-CM | POA: Diagnosis not present

## 2023-04-19 DIAGNOSIS — Z7984 Long term (current) use of oral hypoglycemic drugs: Secondary | ICD-10-CM | POA: Diagnosis not present

## 2023-04-19 DIAGNOSIS — E785 Hyperlipidemia, unspecified: Secondary | ICD-10-CM | POA: Diagnosis not present

## 2023-04-19 DIAGNOSIS — Z87891 Personal history of nicotine dependence: Secondary | ICD-10-CM | POA: Diagnosis not present

## 2023-04-19 DIAGNOSIS — E11621 Type 2 diabetes mellitus with foot ulcer: Secondary | ICD-10-CM | POA: Diagnosis not present

## 2023-04-19 DIAGNOSIS — S71102S Unspecified open wound, left thigh, sequela: Secondary | ICD-10-CM | POA: Diagnosis not present

## 2023-04-19 DIAGNOSIS — I10 Essential (primary) hypertension: Secondary | ICD-10-CM | POA: Diagnosis not present

## 2023-04-19 DIAGNOSIS — L97521 Non-pressure chronic ulcer of other part of left foot limited to breakdown of skin: Secondary | ICD-10-CM | POA: Diagnosis not present

## 2023-04-19 DIAGNOSIS — S91001A Unspecified open wound, right ankle, initial encounter: Secondary | ICD-10-CM | POA: Diagnosis not present

## 2023-04-19 DIAGNOSIS — Z79899 Other long term (current) drug therapy: Secondary | ICD-10-CM | POA: Diagnosis not present

## 2023-04-21 DIAGNOSIS — J449 Chronic obstructive pulmonary disease, unspecified: Secondary | ICD-10-CM | POA: Diagnosis not present

## 2023-04-21 DIAGNOSIS — E1142 Type 2 diabetes mellitus with diabetic polyneuropathy: Secondary | ICD-10-CM | POA: Diagnosis not present

## 2023-04-21 DIAGNOSIS — G8220 Paraplegia, unspecified: Secondary | ICD-10-CM | POA: Diagnosis not present

## 2023-04-21 DIAGNOSIS — L89223 Pressure ulcer of left hip, stage 3: Secondary | ICD-10-CM | POA: Diagnosis not present

## 2023-04-21 DIAGNOSIS — I10 Essential (primary) hypertension: Secondary | ICD-10-CM | POA: Diagnosis not present

## 2023-04-21 DIAGNOSIS — Z7984 Long term (current) use of oral hypoglycemic drugs: Secondary | ICD-10-CM | POA: Diagnosis not present

## 2023-04-24 DIAGNOSIS — E1142 Type 2 diabetes mellitus with diabetic polyneuropathy: Secondary | ICD-10-CM | POA: Diagnosis not present

## 2023-04-24 DIAGNOSIS — G8220 Paraplegia, unspecified: Secondary | ICD-10-CM | POA: Diagnosis not present

## 2023-04-24 DIAGNOSIS — J449 Chronic obstructive pulmonary disease, unspecified: Secondary | ICD-10-CM | POA: Diagnosis not present

## 2023-04-24 DIAGNOSIS — Z7984 Long term (current) use of oral hypoglycemic drugs: Secondary | ICD-10-CM | POA: Diagnosis not present

## 2023-04-24 DIAGNOSIS — I10 Essential (primary) hypertension: Secondary | ICD-10-CM | POA: Diagnosis not present

## 2023-04-24 DIAGNOSIS — L89223 Pressure ulcer of left hip, stage 3: Secondary | ICD-10-CM | POA: Diagnosis not present

## 2023-04-26 DIAGNOSIS — J449 Chronic obstructive pulmonary disease, unspecified: Secondary | ICD-10-CM | POA: Diagnosis not present

## 2023-04-26 DIAGNOSIS — E11621 Type 2 diabetes mellitus with foot ulcer: Secondary | ICD-10-CM | POA: Diagnosis not present

## 2023-04-26 DIAGNOSIS — I10 Essential (primary) hypertension: Secondary | ICD-10-CM | POA: Diagnosis not present

## 2023-04-26 DIAGNOSIS — S71002S Unspecified open wound, left hip, sequela: Secondary | ICD-10-CM | POA: Diagnosis not present

## 2023-04-26 DIAGNOSIS — Z79899 Other long term (current) drug therapy: Secondary | ICD-10-CM | POA: Diagnosis not present

## 2023-04-26 DIAGNOSIS — L97521 Non-pressure chronic ulcer of other part of left foot limited to breakdown of skin: Secondary | ICD-10-CM | POA: Diagnosis not present

## 2023-04-26 DIAGNOSIS — Z7984 Long term (current) use of oral hypoglycemic drugs: Secondary | ICD-10-CM | POA: Diagnosis not present

## 2023-04-26 DIAGNOSIS — S71102S Unspecified open wound, left thigh, sequela: Secondary | ICD-10-CM | POA: Diagnosis not present

## 2023-04-26 DIAGNOSIS — L89512 Pressure ulcer of right ankle, stage 2: Secondary | ICD-10-CM | POA: Diagnosis not present

## 2023-04-26 DIAGNOSIS — E785 Hyperlipidemia, unspecified: Secondary | ICD-10-CM | POA: Diagnosis not present

## 2023-04-26 DIAGNOSIS — Z87891 Personal history of nicotine dependence: Secondary | ICD-10-CM | POA: Diagnosis not present

## 2023-04-26 DIAGNOSIS — S91001A Unspecified open wound, right ankle, initial encounter: Secondary | ICD-10-CM | POA: Diagnosis not present

## 2023-04-28 DIAGNOSIS — E11621 Type 2 diabetes mellitus with foot ulcer: Secondary | ICD-10-CM | POA: Diagnosis not present

## 2023-04-28 DIAGNOSIS — I10 Essential (primary) hypertension: Secondary | ICD-10-CM | POA: Diagnosis not present

## 2023-04-28 DIAGNOSIS — G8220 Paraplegia, unspecified: Secondary | ICD-10-CM | POA: Diagnosis not present

## 2023-04-28 DIAGNOSIS — L97521 Non-pressure chronic ulcer of other part of left foot limited to breakdown of skin: Secondary | ICD-10-CM | POA: Diagnosis not present

## 2023-04-28 DIAGNOSIS — E1142 Type 2 diabetes mellitus with diabetic polyneuropathy: Secondary | ICD-10-CM | POA: Diagnosis not present

## 2023-04-28 DIAGNOSIS — L89512 Pressure ulcer of right ankle, stage 2: Secondary | ICD-10-CM | POA: Diagnosis not present

## 2023-04-28 DIAGNOSIS — J449 Chronic obstructive pulmonary disease, unspecified: Secondary | ICD-10-CM | POA: Diagnosis not present

## 2023-04-28 DIAGNOSIS — L89223 Pressure ulcer of left hip, stage 3: Secondary | ICD-10-CM | POA: Diagnosis not present

## 2023-04-28 DIAGNOSIS — Z7984 Long term (current) use of oral hypoglycemic drugs: Secondary | ICD-10-CM | POA: Diagnosis not present

## 2023-05-04 DIAGNOSIS — S71102S Unspecified open wound, left thigh, sequela: Secondary | ICD-10-CM | POA: Diagnosis not present

## 2023-05-04 DIAGNOSIS — I10 Essential (primary) hypertension: Secondary | ICD-10-CM | POA: Diagnosis not present

## 2023-05-04 DIAGNOSIS — S71002S Unspecified open wound, left hip, sequela: Secondary | ICD-10-CM | POA: Diagnosis not present

## 2023-05-04 DIAGNOSIS — Z87891 Personal history of nicotine dependence: Secondary | ICD-10-CM | POA: Diagnosis not present

## 2023-05-04 DIAGNOSIS — L97521 Non-pressure chronic ulcer of other part of left foot limited to breakdown of skin: Secondary | ICD-10-CM | POA: Diagnosis not present

## 2023-05-04 DIAGNOSIS — Z7984 Long term (current) use of oral hypoglycemic drugs: Secondary | ICD-10-CM | POA: Diagnosis not present

## 2023-05-04 DIAGNOSIS — E11621 Type 2 diabetes mellitus with foot ulcer: Secondary | ICD-10-CM | POA: Diagnosis not present

## 2023-05-04 DIAGNOSIS — B372 Candidiasis of skin and nail: Secondary | ICD-10-CM | POA: Diagnosis not present

## 2023-05-04 DIAGNOSIS — E785 Hyperlipidemia, unspecified: Secondary | ICD-10-CM | POA: Diagnosis not present

## 2023-05-04 DIAGNOSIS — G8220 Paraplegia, unspecified: Secondary | ICD-10-CM | POA: Diagnosis not present

## 2023-05-04 DIAGNOSIS — Z299 Encounter for prophylactic measures, unspecified: Secondary | ICD-10-CM | POA: Diagnosis not present

## 2023-05-04 DIAGNOSIS — Z79899 Other long term (current) drug therapy: Secondary | ICD-10-CM | POA: Diagnosis not present

## 2023-05-04 DIAGNOSIS — J449 Chronic obstructive pulmonary disease, unspecified: Secondary | ICD-10-CM | POA: Diagnosis not present

## 2023-05-08 DIAGNOSIS — E1142 Type 2 diabetes mellitus with diabetic polyneuropathy: Secondary | ICD-10-CM | POA: Diagnosis not present

## 2023-05-08 DIAGNOSIS — L89223 Pressure ulcer of left hip, stage 3: Secondary | ICD-10-CM | POA: Diagnosis not present

## 2023-05-08 DIAGNOSIS — G8220 Paraplegia, unspecified: Secondary | ICD-10-CM | POA: Diagnosis not present

## 2023-05-08 DIAGNOSIS — I10 Essential (primary) hypertension: Secondary | ICD-10-CM | POA: Diagnosis not present

## 2023-05-08 DIAGNOSIS — Z7984 Long term (current) use of oral hypoglycemic drugs: Secondary | ICD-10-CM | POA: Diagnosis not present

## 2023-05-08 DIAGNOSIS — J449 Chronic obstructive pulmonary disease, unspecified: Secondary | ICD-10-CM | POA: Diagnosis not present

## 2023-05-10 DIAGNOSIS — E1142 Type 2 diabetes mellitus with diabetic polyneuropathy: Secondary | ICD-10-CM | POA: Diagnosis not present

## 2023-05-10 DIAGNOSIS — Z87891 Personal history of nicotine dependence: Secondary | ICD-10-CM | POA: Diagnosis not present

## 2023-05-10 DIAGNOSIS — S91102A Unspecified open wound of left great toe without damage to nail, initial encounter: Secondary | ICD-10-CM | POA: Diagnosis not present

## 2023-05-10 DIAGNOSIS — J449 Chronic obstructive pulmonary disease, unspecified: Secondary | ICD-10-CM | POA: Diagnosis not present

## 2023-05-10 DIAGNOSIS — Z885 Allergy status to narcotic agent status: Secondary | ICD-10-CM | POA: Diagnosis not present

## 2023-05-10 DIAGNOSIS — E7800 Pure hypercholesterolemia, unspecified: Secondary | ICD-10-CM | POA: Diagnosis not present

## 2023-05-10 DIAGNOSIS — I10 Essential (primary) hypertension: Secondary | ICD-10-CM | POA: Diagnosis not present

## 2023-05-10 DIAGNOSIS — E11621 Type 2 diabetes mellitus with foot ulcer: Secondary | ICD-10-CM | POA: Diagnosis not present

## 2023-05-10 DIAGNOSIS — K2100 Gastro-esophageal reflux disease with esophagitis, without bleeding: Secondary | ICD-10-CM | POA: Diagnosis not present

## 2023-05-10 DIAGNOSIS — L97521 Non-pressure chronic ulcer of other part of left foot limited to breakdown of skin: Secondary | ICD-10-CM | POA: Diagnosis not present

## 2023-05-10 DIAGNOSIS — S91001S Unspecified open wound, right ankle, sequela: Secondary | ICD-10-CM | POA: Diagnosis not present

## 2023-05-11 DIAGNOSIS — G8220 Paraplegia, unspecified: Secondary | ICD-10-CM | POA: Diagnosis not present

## 2023-05-11 DIAGNOSIS — L98499 Non-pressure chronic ulcer of skin of other sites with unspecified severity: Secondary | ICD-10-CM | POA: Diagnosis not present

## 2023-05-11 DIAGNOSIS — Z299 Encounter for prophylactic measures, unspecified: Secondary | ICD-10-CM | POA: Diagnosis not present

## 2023-05-11 DIAGNOSIS — I10 Essential (primary) hypertension: Secondary | ICD-10-CM | POA: Diagnosis not present

## 2023-05-11 DIAGNOSIS — E11622 Type 2 diabetes mellitus with other skin ulcer: Secondary | ICD-10-CM | POA: Diagnosis not present

## 2023-05-11 DIAGNOSIS — M459 Ankylosing spondylitis of unspecified sites in spine: Secondary | ICD-10-CM | POA: Diagnosis not present

## 2023-05-12 DIAGNOSIS — L89223 Pressure ulcer of left hip, stage 3: Secondary | ICD-10-CM | POA: Diagnosis not present

## 2023-05-12 DIAGNOSIS — I10 Essential (primary) hypertension: Secondary | ICD-10-CM | POA: Diagnosis not present

## 2023-05-12 DIAGNOSIS — E1142 Type 2 diabetes mellitus with diabetic polyneuropathy: Secondary | ICD-10-CM | POA: Diagnosis not present

## 2023-05-12 DIAGNOSIS — G8220 Paraplegia, unspecified: Secondary | ICD-10-CM | POA: Diagnosis not present

## 2023-05-12 DIAGNOSIS — Z7984 Long term (current) use of oral hypoglycemic drugs: Secondary | ICD-10-CM | POA: Diagnosis not present

## 2023-05-12 DIAGNOSIS — J449 Chronic obstructive pulmonary disease, unspecified: Secondary | ICD-10-CM | POA: Diagnosis not present

## 2023-05-15 DIAGNOSIS — Z7984 Long term (current) use of oral hypoglycemic drugs: Secondary | ICD-10-CM | POA: Diagnosis not present

## 2023-05-15 DIAGNOSIS — L89223 Pressure ulcer of left hip, stage 3: Secondary | ICD-10-CM | POA: Diagnosis not present

## 2023-05-15 DIAGNOSIS — E1142 Type 2 diabetes mellitus with diabetic polyneuropathy: Secondary | ICD-10-CM | POA: Diagnosis not present

## 2023-05-15 DIAGNOSIS — G8220 Paraplegia, unspecified: Secondary | ICD-10-CM | POA: Diagnosis not present

## 2023-05-15 DIAGNOSIS — J449 Chronic obstructive pulmonary disease, unspecified: Secondary | ICD-10-CM | POA: Diagnosis not present

## 2023-05-15 DIAGNOSIS — I10 Essential (primary) hypertension: Secondary | ICD-10-CM | POA: Diagnosis not present

## 2023-05-16 DIAGNOSIS — G8220 Paraplegia, unspecified: Secondary | ICD-10-CM | POA: Diagnosis not present

## 2023-05-17 DIAGNOSIS — E11621 Type 2 diabetes mellitus with foot ulcer: Secondary | ICD-10-CM | POA: Diagnosis not present

## 2023-05-17 DIAGNOSIS — S91001S Unspecified open wound, right ankle, sequela: Secondary | ICD-10-CM | POA: Diagnosis not present

## 2023-05-17 DIAGNOSIS — E7800 Pure hypercholesterolemia, unspecified: Secondary | ICD-10-CM | POA: Diagnosis not present

## 2023-05-17 DIAGNOSIS — S91001D Unspecified open wound, right ankle, subsequent encounter: Secondary | ICD-10-CM | POA: Diagnosis not present

## 2023-05-17 DIAGNOSIS — K2100 Gastro-esophageal reflux disease with esophagitis, without bleeding: Secondary | ICD-10-CM | POA: Diagnosis not present

## 2023-05-17 DIAGNOSIS — J449 Chronic obstructive pulmonary disease, unspecified: Secondary | ICD-10-CM | POA: Diagnosis not present

## 2023-05-17 DIAGNOSIS — I10 Essential (primary) hypertension: Secondary | ICD-10-CM | POA: Diagnosis not present

## 2023-05-17 DIAGNOSIS — L97521 Non-pressure chronic ulcer of other part of left foot limited to breakdown of skin: Secondary | ICD-10-CM | POA: Diagnosis not present

## 2023-05-17 DIAGNOSIS — E1142 Type 2 diabetes mellitus with diabetic polyneuropathy: Secondary | ICD-10-CM | POA: Diagnosis not present

## 2023-05-17 DIAGNOSIS — Z87891 Personal history of nicotine dependence: Secondary | ICD-10-CM | POA: Diagnosis not present

## 2023-05-17 DIAGNOSIS — Z885 Allergy status to narcotic agent status: Secondary | ICD-10-CM | POA: Diagnosis not present

## 2023-05-19 DIAGNOSIS — L89223 Pressure ulcer of left hip, stage 3: Secondary | ICD-10-CM | POA: Diagnosis not present

## 2023-05-19 DIAGNOSIS — J449 Chronic obstructive pulmonary disease, unspecified: Secondary | ICD-10-CM | POA: Diagnosis not present

## 2023-05-19 DIAGNOSIS — E1142 Type 2 diabetes mellitus with diabetic polyneuropathy: Secondary | ICD-10-CM | POA: Diagnosis not present

## 2023-05-19 DIAGNOSIS — I10 Essential (primary) hypertension: Secondary | ICD-10-CM | POA: Diagnosis not present

## 2023-05-19 DIAGNOSIS — Z7984 Long term (current) use of oral hypoglycemic drugs: Secondary | ICD-10-CM | POA: Diagnosis not present

## 2023-05-19 DIAGNOSIS — G8220 Paraplegia, unspecified: Secondary | ICD-10-CM | POA: Diagnosis not present

## 2023-05-22 DIAGNOSIS — E1142 Type 2 diabetes mellitus with diabetic polyneuropathy: Secondary | ICD-10-CM | POA: Diagnosis not present

## 2023-05-22 DIAGNOSIS — L89223 Pressure ulcer of left hip, stage 3: Secondary | ICD-10-CM | POA: Diagnosis not present

## 2023-05-22 DIAGNOSIS — I10 Essential (primary) hypertension: Secondary | ICD-10-CM | POA: Diagnosis not present

## 2023-05-22 DIAGNOSIS — J449 Chronic obstructive pulmonary disease, unspecified: Secondary | ICD-10-CM | POA: Diagnosis not present

## 2023-05-22 DIAGNOSIS — G8220 Paraplegia, unspecified: Secondary | ICD-10-CM | POA: Diagnosis not present

## 2023-05-22 DIAGNOSIS — Z7984 Long term (current) use of oral hypoglycemic drugs: Secondary | ICD-10-CM | POA: Diagnosis not present

## 2023-05-23 DIAGNOSIS — E1142 Type 2 diabetes mellitus with diabetic polyneuropathy: Secondary | ICD-10-CM | POA: Diagnosis not present

## 2023-05-23 DIAGNOSIS — G8220 Paraplegia, unspecified: Secondary | ICD-10-CM | POA: Diagnosis not present

## 2023-05-23 DIAGNOSIS — E1165 Type 2 diabetes mellitus with hyperglycemia: Secondary | ICD-10-CM | POA: Diagnosis not present

## 2023-05-23 DIAGNOSIS — Z23 Encounter for immunization: Secondary | ICD-10-CM | POA: Diagnosis not present

## 2023-05-23 DIAGNOSIS — I10 Essential (primary) hypertension: Secondary | ICD-10-CM | POA: Diagnosis not present

## 2023-05-23 DIAGNOSIS — Z299 Encounter for prophylactic measures, unspecified: Secondary | ICD-10-CM | POA: Diagnosis not present

## 2023-05-24 DIAGNOSIS — L97521 Non-pressure chronic ulcer of other part of left foot limited to breakdown of skin: Secondary | ICD-10-CM | POA: Diagnosis not present

## 2023-05-24 DIAGNOSIS — E11621 Type 2 diabetes mellitus with foot ulcer: Secondary | ICD-10-CM | POA: Diagnosis not present

## 2023-05-24 DIAGNOSIS — J449 Chronic obstructive pulmonary disease, unspecified: Secondary | ICD-10-CM | POA: Diagnosis not present

## 2023-05-24 DIAGNOSIS — L89322 Pressure ulcer of left buttock, stage 2: Secondary | ICD-10-CM | POA: Diagnosis not present

## 2023-05-24 DIAGNOSIS — K2100 Gastro-esophageal reflux disease with esophagitis, without bleeding: Secondary | ICD-10-CM | POA: Diagnosis not present

## 2023-05-24 DIAGNOSIS — E1142 Type 2 diabetes mellitus with diabetic polyneuropathy: Secondary | ICD-10-CM | POA: Diagnosis not present

## 2023-05-24 DIAGNOSIS — Z885 Allergy status to narcotic agent status: Secondary | ICD-10-CM | POA: Diagnosis not present

## 2023-05-24 DIAGNOSIS — Z87891 Personal history of nicotine dependence: Secondary | ICD-10-CM | POA: Diagnosis not present

## 2023-05-24 DIAGNOSIS — S91001S Unspecified open wound, right ankle, sequela: Secondary | ICD-10-CM | POA: Diagnosis not present

## 2023-05-24 DIAGNOSIS — I10 Essential (primary) hypertension: Secondary | ICD-10-CM | POA: Diagnosis not present

## 2023-05-24 DIAGNOSIS — E7800 Pure hypercholesterolemia, unspecified: Secondary | ICD-10-CM | POA: Diagnosis not present

## 2023-05-24 DIAGNOSIS — L89229 Pressure ulcer of left hip, unspecified stage: Secondary | ICD-10-CM | POA: Diagnosis not present

## 2023-05-24 DIAGNOSIS — X58XXXA Exposure to other specified factors, initial encounter: Secondary | ICD-10-CM | POA: Diagnosis not present

## 2023-05-25 DIAGNOSIS — J441 Chronic obstructive pulmonary disease with (acute) exacerbation: Secondary | ICD-10-CM | POA: Diagnosis not present

## 2023-05-25 DIAGNOSIS — Z299 Encounter for prophylactic measures, unspecified: Secondary | ICD-10-CM | POA: Diagnosis not present

## 2023-05-25 DIAGNOSIS — R0602 Shortness of breath: Secondary | ICD-10-CM | POA: Diagnosis not present

## 2023-05-26 DIAGNOSIS — G8220 Paraplegia, unspecified: Secondary | ICD-10-CM | POA: Diagnosis not present

## 2023-05-26 DIAGNOSIS — E1142 Type 2 diabetes mellitus with diabetic polyneuropathy: Secondary | ICD-10-CM | POA: Diagnosis not present

## 2023-05-26 DIAGNOSIS — L89223 Pressure ulcer of left hip, stage 3: Secondary | ICD-10-CM | POA: Diagnosis not present

## 2023-05-26 DIAGNOSIS — Z7984 Long term (current) use of oral hypoglycemic drugs: Secondary | ICD-10-CM | POA: Diagnosis not present

## 2023-05-26 DIAGNOSIS — J449 Chronic obstructive pulmonary disease, unspecified: Secondary | ICD-10-CM | POA: Diagnosis not present

## 2023-05-26 DIAGNOSIS — I10 Essential (primary) hypertension: Secondary | ICD-10-CM | POA: Diagnosis not present

## 2023-05-29 DIAGNOSIS — E1142 Type 2 diabetes mellitus with diabetic polyneuropathy: Secondary | ICD-10-CM | POA: Diagnosis not present

## 2023-05-29 DIAGNOSIS — J449 Chronic obstructive pulmonary disease, unspecified: Secondary | ICD-10-CM | POA: Diagnosis not present

## 2023-05-29 DIAGNOSIS — G8220 Paraplegia, unspecified: Secondary | ICD-10-CM | POA: Diagnosis not present

## 2023-05-29 DIAGNOSIS — L89223 Pressure ulcer of left hip, stage 3: Secondary | ICD-10-CM | POA: Diagnosis not present

## 2023-05-29 DIAGNOSIS — I10 Essential (primary) hypertension: Secondary | ICD-10-CM | POA: Diagnosis not present

## 2023-05-29 DIAGNOSIS — Z7984 Long term (current) use of oral hypoglycemic drugs: Secondary | ICD-10-CM | POA: Diagnosis not present

## 2023-05-31 DIAGNOSIS — S81801A Unspecified open wound, right lower leg, initial encounter: Secondary | ICD-10-CM | POA: Diagnosis not present

## 2023-05-31 DIAGNOSIS — L97521 Non-pressure chronic ulcer of other part of left foot limited to breakdown of skin: Secondary | ICD-10-CM | POA: Diagnosis not present

## 2023-05-31 DIAGNOSIS — S91001A Unspecified open wound, right ankle, initial encounter: Secondary | ICD-10-CM | POA: Diagnosis not present

## 2023-05-31 DIAGNOSIS — I10 Essential (primary) hypertension: Secondary | ICD-10-CM | POA: Diagnosis not present

## 2023-05-31 DIAGNOSIS — E7800 Pure hypercholesterolemia, unspecified: Secondary | ICD-10-CM | POA: Diagnosis not present

## 2023-05-31 DIAGNOSIS — K2100 Gastro-esophageal reflux disease with esophagitis, without bleeding: Secondary | ICD-10-CM | POA: Diagnosis not present

## 2023-05-31 DIAGNOSIS — E11621 Type 2 diabetes mellitus with foot ulcer: Secondary | ICD-10-CM | POA: Diagnosis not present

## 2023-05-31 DIAGNOSIS — E1142 Type 2 diabetes mellitus with diabetic polyneuropathy: Secondary | ICD-10-CM | POA: Diagnosis not present

## 2023-05-31 DIAGNOSIS — S91001S Unspecified open wound, right ankle, sequela: Secondary | ICD-10-CM | POA: Diagnosis not present

## 2023-05-31 DIAGNOSIS — Z885 Allergy status to narcotic agent status: Secondary | ICD-10-CM | POA: Diagnosis not present

## 2023-05-31 DIAGNOSIS — J449 Chronic obstructive pulmonary disease, unspecified: Secondary | ICD-10-CM | POA: Diagnosis not present

## 2023-05-31 DIAGNOSIS — Z87891 Personal history of nicotine dependence: Secondary | ICD-10-CM | POA: Diagnosis not present

## 2023-06-02 DIAGNOSIS — E1142 Type 2 diabetes mellitus with diabetic polyneuropathy: Secondary | ICD-10-CM | POA: Diagnosis not present

## 2023-06-02 DIAGNOSIS — G8220 Paraplegia, unspecified: Secondary | ICD-10-CM | POA: Diagnosis not present

## 2023-06-02 DIAGNOSIS — J449 Chronic obstructive pulmonary disease, unspecified: Secondary | ICD-10-CM | POA: Diagnosis not present

## 2023-06-02 DIAGNOSIS — I10 Essential (primary) hypertension: Secondary | ICD-10-CM | POA: Diagnosis not present

## 2023-06-02 DIAGNOSIS — L89223 Pressure ulcer of left hip, stage 3: Secondary | ICD-10-CM | POA: Diagnosis not present

## 2023-06-02 DIAGNOSIS — Z7984 Long term (current) use of oral hypoglycemic drugs: Secondary | ICD-10-CM | POA: Diagnosis not present

## 2023-06-07 DIAGNOSIS — J449 Chronic obstructive pulmonary disease, unspecified: Secondary | ICD-10-CM | POA: Diagnosis not present

## 2023-06-07 DIAGNOSIS — Z79899 Other long term (current) drug therapy: Secondary | ICD-10-CM | POA: Diagnosis not present

## 2023-06-07 DIAGNOSIS — Z7982 Long term (current) use of aspirin: Secondary | ICD-10-CM | POA: Diagnosis not present

## 2023-06-07 DIAGNOSIS — E11621 Type 2 diabetes mellitus with foot ulcer: Secondary | ICD-10-CM | POA: Diagnosis not present

## 2023-06-07 DIAGNOSIS — S91001D Unspecified open wound, right ankle, subsequent encounter: Secondary | ICD-10-CM | POA: Diagnosis not present

## 2023-06-07 DIAGNOSIS — G8220 Paraplegia, unspecified: Secondary | ICD-10-CM | POA: Diagnosis not present

## 2023-06-07 DIAGNOSIS — Z66 Do not resuscitate: Secondary | ICD-10-CM | POA: Diagnosis not present

## 2023-06-07 DIAGNOSIS — Z7984 Long term (current) use of oral hypoglycemic drugs: Secondary | ICD-10-CM | POA: Diagnosis not present

## 2023-06-07 DIAGNOSIS — E785 Hyperlipidemia, unspecified: Secondary | ICD-10-CM | POA: Diagnosis not present

## 2023-06-07 DIAGNOSIS — X58XXXA Exposure to other specified factors, initial encounter: Secondary | ICD-10-CM | POA: Diagnosis not present

## 2023-06-07 DIAGNOSIS — S91001S Unspecified open wound, right ankle, sequela: Secondary | ICD-10-CM | POA: Diagnosis not present

## 2023-06-07 DIAGNOSIS — Z299 Encounter for prophylactic measures, unspecified: Secondary | ICD-10-CM | POA: Diagnosis not present

## 2023-06-07 DIAGNOSIS — R059 Cough, unspecified: Secondary | ICD-10-CM | POA: Diagnosis not present

## 2023-06-07 DIAGNOSIS — S91102D Unspecified open wound of left great toe without damage to nail, subsequent encounter: Secondary | ICD-10-CM | POA: Diagnosis not present

## 2023-06-07 DIAGNOSIS — E1142 Type 2 diabetes mellitus with diabetic polyneuropathy: Secondary | ICD-10-CM | POA: Diagnosis not present

## 2023-06-07 DIAGNOSIS — Z888 Allergy status to other drugs, medicaments and biological substances status: Secondary | ICD-10-CM | POA: Diagnosis not present

## 2023-06-07 DIAGNOSIS — R0902 Hypoxemia: Secondary | ICD-10-CM | POA: Diagnosis not present

## 2023-06-07 DIAGNOSIS — I10 Essential (primary) hypertension: Secondary | ICD-10-CM | POA: Diagnosis not present

## 2023-06-07 DIAGNOSIS — Z885 Allergy status to narcotic agent status: Secondary | ICD-10-CM | POA: Diagnosis not present

## 2023-06-07 DIAGNOSIS — S81801A Unspecified open wound, right lower leg, initial encounter: Secondary | ICD-10-CM | POA: Diagnosis not present

## 2023-06-07 DIAGNOSIS — L89512 Pressure ulcer of right ankle, stage 2: Secondary | ICD-10-CM | POA: Diagnosis not present

## 2023-06-07 DIAGNOSIS — Z87891 Personal history of nicotine dependence: Secondary | ICD-10-CM | POA: Diagnosis not present

## 2023-06-07 DIAGNOSIS — J441 Chronic obstructive pulmonary disease with (acute) exacerbation: Secondary | ICD-10-CM | POA: Diagnosis not present

## 2023-06-07 DIAGNOSIS — L97521 Non-pressure chronic ulcer of other part of left foot limited to breakdown of skin: Secondary | ICD-10-CM | POA: Diagnosis not present

## 2023-06-07 DIAGNOSIS — E7800 Pure hypercholesterolemia, unspecified: Secondary | ICD-10-CM | POA: Diagnosis not present

## 2023-06-07 DIAGNOSIS — K2100 Gastro-esophageal reflux disease with esophagitis, without bleeding: Secondary | ICD-10-CM | POA: Diagnosis not present

## 2023-06-07 DIAGNOSIS — Z86711 Personal history of pulmonary embolism: Secondary | ICD-10-CM | POA: Diagnosis not present

## 2023-06-07 DIAGNOSIS — Z96641 Presence of right artificial hip joint: Secondary | ICD-10-CM | POA: Diagnosis not present

## 2023-06-08 DIAGNOSIS — E11621 Type 2 diabetes mellitus with foot ulcer: Secondary | ICD-10-CM | POA: Diagnosis not present

## 2023-06-08 DIAGNOSIS — L97521 Non-pressure chronic ulcer of other part of left foot limited to breakdown of skin: Secondary | ICD-10-CM | POA: Diagnosis not present

## 2023-06-08 DIAGNOSIS — E1142 Type 2 diabetes mellitus with diabetic polyneuropathy: Secondary | ICD-10-CM | POA: Diagnosis not present

## 2023-06-08 DIAGNOSIS — J449 Chronic obstructive pulmonary disease, unspecified: Secondary | ICD-10-CM | POA: Diagnosis not present

## 2023-06-08 DIAGNOSIS — L89512 Pressure ulcer of right ankle, stage 2: Secondary | ICD-10-CM | POA: Diagnosis not present

## 2023-06-08 DIAGNOSIS — G8220 Paraplegia, unspecified: Secondary | ICD-10-CM | POA: Diagnosis not present

## 2023-06-12 DIAGNOSIS — L89512 Pressure ulcer of right ankle, stage 2: Secondary | ICD-10-CM | POA: Diagnosis not present

## 2023-06-12 DIAGNOSIS — E11621 Type 2 diabetes mellitus with foot ulcer: Secondary | ICD-10-CM | POA: Diagnosis not present

## 2023-06-12 DIAGNOSIS — L97521 Non-pressure chronic ulcer of other part of left foot limited to breakdown of skin: Secondary | ICD-10-CM | POA: Diagnosis not present

## 2023-06-12 DIAGNOSIS — J449 Chronic obstructive pulmonary disease, unspecified: Secondary | ICD-10-CM | POA: Diagnosis not present

## 2023-06-12 DIAGNOSIS — G8220 Paraplegia, unspecified: Secondary | ICD-10-CM | POA: Diagnosis not present

## 2023-06-12 DIAGNOSIS — E1142 Type 2 diabetes mellitus with diabetic polyneuropathy: Secondary | ICD-10-CM | POA: Diagnosis not present

## 2023-06-14 DIAGNOSIS — Z885 Allergy status to narcotic agent status: Secondary | ICD-10-CM | POA: Diagnosis not present

## 2023-06-14 DIAGNOSIS — S91001S Unspecified open wound, right ankle, sequela: Secondary | ICD-10-CM | POA: Diagnosis not present

## 2023-06-14 DIAGNOSIS — R0902 Hypoxemia: Secondary | ICD-10-CM | POA: Diagnosis not present

## 2023-06-14 DIAGNOSIS — Z7984 Long term (current) use of oral hypoglycemic drugs: Secondary | ICD-10-CM | POA: Diagnosis not present

## 2023-06-14 DIAGNOSIS — I10 Essential (primary) hypertension: Secondary | ICD-10-CM | POA: Diagnosis not present

## 2023-06-14 DIAGNOSIS — Z96641 Presence of right artificial hip joint: Secondary | ICD-10-CM | POA: Diagnosis not present

## 2023-06-14 DIAGNOSIS — J449 Chronic obstructive pulmonary disease, unspecified: Secondary | ICD-10-CM | POA: Diagnosis not present

## 2023-06-14 DIAGNOSIS — Z87891 Personal history of nicotine dependence: Secondary | ICD-10-CM | POA: Diagnosis not present

## 2023-06-14 DIAGNOSIS — L97521 Non-pressure chronic ulcer of other part of left foot limited to breakdown of skin: Secondary | ICD-10-CM | POA: Diagnosis not present

## 2023-06-14 DIAGNOSIS — E785 Hyperlipidemia, unspecified: Secondary | ICD-10-CM | POA: Diagnosis not present

## 2023-06-14 DIAGNOSIS — K2100 Gastro-esophageal reflux disease with esophagitis, without bleeding: Secondary | ICD-10-CM | POA: Diagnosis not present

## 2023-06-14 DIAGNOSIS — Z888 Allergy status to other drugs, medicaments and biological substances status: Secondary | ICD-10-CM | POA: Diagnosis not present

## 2023-06-14 DIAGNOSIS — Z66 Do not resuscitate: Secondary | ICD-10-CM | POA: Diagnosis not present

## 2023-06-14 DIAGNOSIS — S81801D Unspecified open wound, right lower leg, subsequent encounter: Secondary | ICD-10-CM | POA: Diagnosis not present

## 2023-06-14 DIAGNOSIS — E7800 Pure hypercholesterolemia, unspecified: Secondary | ICD-10-CM | POA: Diagnosis not present

## 2023-06-14 DIAGNOSIS — E11621 Type 2 diabetes mellitus with foot ulcer: Secondary | ICD-10-CM | POA: Diagnosis not present

## 2023-06-14 DIAGNOSIS — S91001D Unspecified open wound, right ankle, subsequent encounter: Secondary | ICD-10-CM | POA: Diagnosis not present

## 2023-06-14 DIAGNOSIS — E1142 Type 2 diabetes mellitus with diabetic polyneuropathy: Secondary | ICD-10-CM | POA: Diagnosis not present

## 2023-06-14 DIAGNOSIS — S91102D Unspecified open wound of left great toe without damage to nail, subsequent encounter: Secondary | ICD-10-CM | POA: Diagnosis not present

## 2023-06-14 DIAGNOSIS — Z7982 Long term (current) use of aspirin: Secondary | ICD-10-CM | POA: Diagnosis not present

## 2023-06-14 DIAGNOSIS — G8220 Paraplegia, unspecified: Secondary | ICD-10-CM | POA: Diagnosis not present

## 2023-06-14 DIAGNOSIS — Z86711 Personal history of pulmonary embolism: Secondary | ICD-10-CM | POA: Diagnosis not present

## 2023-06-14 DIAGNOSIS — Z79899 Other long term (current) drug therapy: Secondary | ICD-10-CM | POA: Diagnosis not present

## 2023-06-15 DIAGNOSIS — G8250 Quadriplegia, unspecified: Secondary | ICD-10-CM | POA: Diagnosis not present

## 2023-06-15 DIAGNOSIS — G8220 Paraplegia, unspecified: Secondary | ICD-10-CM | POA: Diagnosis not present

## 2023-06-16 DIAGNOSIS — L97521 Non-pressure chronic ulcer of other part of left foot limited to breakdown of skin: Secondary | ICD-10-CM | POA: Diagnosis not present

## 2023-06-16 DIAGNOSIS — G8220 Paraplegia, unspecified: Secondary | ICD-10-CM | POA: Diagnosis not present

## 2023-06-16 DIAGNOSIS — E1142 Type 2 diabetes mellitus with diabetic polyneuropathy: Secondary | ICD-10-CM | POA: Diagnosis not present

## 2023-06-16 DIAGNOSIS — J449 Chronic obstructive pulmonary disease, unspecified: Secondary | ICD-10-CM | POA: Diagnosis not present

## 2023-06-16 DIAGNOSIS — E11621 Type 2 diabetes mellitus with foot ulcer: Secondary | ICD-10-CM | POA: Diagnosis not present

## 2023-06-16 DIAGNOSIS — L89512 Pressure ulcer of right ankle, stage 2: Secondary | ICD-10-CM | POA: Diagnosis not present

## 2023-06-19 DIAGNOSIS — G8220 Paraplegia, unspecified: Secondary | ICD-10-CM | POA: Diagnosis not present

## 2023-06-19 DIAGNOSIS — E1142 Type 2 diabetes mellitus with diabetic polyneuropathy: Secondary | ICD-10-CM | POA: Diagnosis not present

## 2023-06-19 DIAGNOSIS — E11621 Type 2 diabetes mellitus with foot ulcer: Secondary | ICD-10-CM | POA: Diagnosis not present

## 2023-06-19 DIAGNOSIS — J449 Chronic obstructive pulmonary disease, unspecified: Secondary | ICD-10-CM | POA: Diagnosis not present

## 2023-06-19 DIAGNOSIS — L97521 Non-pressure chronic ulcer of other part of left foot limited to breakdown of skin: Secondary | ICD-10-CM | POA: Diagnosis not present

## 2023-06-19 DIAGNOSIS — L89512 Pressure ulcer of right ankle, stage 2: Secondary | ICD-10-CM | POA: Diagnosis not present

## 2023-06-21 DIAGNOSIS — T400X1A Poisoning by opium, accidental (unintentional), initial encounter: Secondary | ICD-10-CM | POA: Diagnosis not present

## 2023-06-21 DIAGNOSIS — Z885 Allergy status to narcotic agent status: Secondary | ICD-10-CM | POA: Diagnosis not present

## 2023-06-21 DIAGNOSIS — Z888 Allergy status to other drugs, medicaments and biological substances status: Secondary | ICD-10-CM | POA: Diagnosis not present

## 2023-06-21 DIAGNOSIS — Z86711 Personal history of pulmonary embolism: Secondary | ICD-10-CM | POA: Diagnosis not present

## 2023-06-21 DIAGNOSIS — Z7982 Long term (current) use of aspirin: Secondary | ICD-10-CM | POA: Diagnosis not present

## 2023-06-21 DIAGNOSIS — L97521 Non-pressure chronic ulcer of other part of left foot limited to breakdown of skin: Secondary | ICD-10-CM | POA: Diagnosis not present

## 2023-06-21 DIAGNOSIS — J449 Chronic obstructive pulmonary disease, unspecified: Secondary | ICD-10-CM | POA: Diagnosis not present

## 2023-06-21 DIAGNOSIS — R5383 Other fatigue: Secondary | ICD-10-CM | POA: Diagnosis not present

## 2023-06-21 DIAGNOSIS — J984 Other disorders of lung: Secondary | ICD-10-CM | POA: Diagnosis not present

## 2023-06-21 DIAGNOSIS — G8220 Paraplegia, unspecified: Secondary | ICD-10-CM | POA: Diagnosis not present

## 2023-06-21 DIAGNOSIS — J9602 Acute respiratory failure with hypercapnia: Secondary | ICD-10-CM | POA: Diagnosis not present

## 2023-06-21 DIAGNOSIS — I10 Essential (primary) hypertension: Secondary | ICD-10-CM | POA: Diagnosis not present

## 2023-06-21 DIAGNOSIS — Z886 Allergy status to analgesic agent status: Secondary | ICD-10-CM | POA: Diagnosis not present

## 2023-06-21 DIAGNOSIS — E11621 Type 2 diabetes mellitus with foot ulcer: Secondary | ICD-10-CM | POA: Diagnosis not present

## 2023-06-21 DIAGNOSIS — Z993 Dependence on wheelchair: Secondary | ICD-10-CM | POA: Diagnosis not present

## 2023-06-21 DIAGNOSIS — Z79899 Other long term (current) drug therapy: Secondary | ICD-10-CM | POA: Diagnosis not present

## 2023-06-21 DIAGNOSIS — S91001D Unspecified open wound, right ankle, subsequent encounter: Secondary | ICD-10-CM | POA: Diagnosis not present

## 2023-06-21 DIAGNOSIS — Z96641 Presence of right artificial hip joint: Secondary | ICD-10-CM | POA: Diagnosis not present

## 2023-06-21 DIAGNOSIS — E1142 Type 2 diabetes mellitus with diabetic polyneuropathy: Secondary | ICD-10-CM | POA: Diagnosis not present

## 2023-06-21 DIAGNOSIS — T40601A Poisoning by unspecified narcotics, accidental (unintentional), initial encounter: Secondary | ICD-10-CM | POA: Diagnosis not present

## 2023-06-21 DIAGNOSIS — X58XXXD Exposure to other specified factors, subsequent encounter: Secondary | ICD-10-CM | POA: Diagnosis not present

## 2023-06-21 DIAGNOSIS — M549 Dorsalgia, unspecified: Secondary | ICD-10-CM | POA: Diagnosis not present

## 2023-06-21 DIAGNOSIS — E785 Hyperlipidemia, unspecified: Secondary | ICD-10-CM | POA: Diagnosis not present

## 2023-06-21 DIAGNOSIS — K2100 Gastro-esophageal reflux disease with esophagitis, without bleeding: Secondary | ICD-10-CM | POA: Diagnosis not present

## 2023-06-21 DIAGNOSIS — Z66 Do not resuscitate: Secondary | ICD-10-CM | POA: Diagnosis not present

## 2023-06-21 DIAGNOSIS — R0602 Shortness of breath: Secondary | ICD-10-CM | POA: Diagnosis not present

## 2023-06-21 DIAGNOSIS — Z87891 Personal history of nicotine dependence: Secondary | ICD-10-CM | POA: Diagnosis not present

## 2023-06-21 DIAGNOSIS — Z7984 Long term (current) use of oral hypoglycemic drugs: Secondary | ICD-10-CM | POA: Diagnosis not present

## 2023-06-21 DIAGNOSIS — M545 Low back pain, unspecified: Secondary | ICD-10-CM | POA: Diagnosis not present

## 2023-06-21 DIAGNOSIS — Z20822 Contact with and (suspected) exposure to covid-19: Secondary | ICD-10-CM | POA: Diagnosis not present

## 2023-06-21 DIAGNOSIS — Z299 Encounter for prophylactic measures, unspecified: Secondary | ICD-10-CM | POA: Diagnosis not present

## 2023-06-21 DIAGNOSIS — E7800 Pure hypercholesterolemia, unspecified: Secondary | ICD-10-CM | POA: Diagnosis not present

## 2023-06-21 DIAGNOSIS — R0902 Hypoxemia: Secondary | ICD-10-CM | POA: Diagnosis not present

## 2023-06-21 DIAGNOSIS — S91102D Unspecified open wound of left great toe without damage to nail, subsequent encounter: Secondary | ICD-10-CM | POA: Diagnosis not present

## 2023-06-21 DIAGNOSIS — S81801D Unspecified open wound, right lower leg, subsequent encounter: Secondary | ICD-10-CM | POA: Diagnosis not present

## 2023-06-21 DIAGNOSIS — J9601 Acute respiratory failure with hypoxia: Secondary | ICD-10-CM | POA: Diagnosis not present

## 2023-06-23 DIAGNOSIS — E11621 Type 2 diabetes mellitus with foot ulcer: Secondary | ICD-10-CM | POA: Diagnosis not present

## 2023-06-23 DIAGNOSIS — J449 Chronic obstructive pulmonary disease, unspecified: Secondary | ICD-10-CM | POA: Diagnosis not present

## 2023-06-23 DIAGNOSIS — L89512 Pressure ulcer of right ankle, stage 2: Secondary | ICD-10-CM | POA: Diagnosis not present

## 2023-06-23 DIAGNOSIS — E1142 Type 2 diabetes mellitus with diabetic polyneuropathy: Secondary | ICD-10-CM | POA: Diagnosis not present

## 2023-06-23 DIAGNOSIS — L97521 Non-pressure chronic ulcer of other part of left foot limited to breakdown of skin: Secondary | ICD-10-CM | POA: Diagnosis not present

## 2023-06-23 DIAGNOSIS — G8220 Paraplegia, unspecified: Secondary | ICD-10-CM | POA: Diagnosis not present

## 2023-06-26 DIAGNOSIS — E1142 Type 2 diabetes mellitus with diabetic polyneuropathy: Secondary | ICD-10-CM | POA: Diagnosis not present

## 2023-06-26 DIAGNOSIS — L97521 Non-pressure chronic ulcer of other part of left foot limited to breakdown of skin: Secondary | ICD-10-CM | POA: Diagnosis not present

## 2023-06-26 DIAGNOSIS — G8220 Paraplegia, unspecified: Secondary | ICD-10-CM | POA: Diagnosis not present

## 2023-06-26 DIAGNOSIS — L89512 Pressure ulcer of right ankle, stage 2: Secondary | ICD-10-CM | POA: Diagnosis not present

## 2023-06-26 DIAGNOSIS — J449 Chronic obstructive pulmonary disease, unspecified: Secondary | ICD-10-CM | POA: Diagnosis not present

## 2023-06-26 DIAGNOSIS — E11621 Type 2 diabetes mellitus with foot ulcer: Secondary | ICD-10-CM | POA: Diagnosis not present

## 2023-06-28 DIAGNOSIS — E11621 Type 2 diabetes mellitus with foot ulcer: Secondary | ICD-10-CM | POA: Diagnosis not present

## 2023-06-28 DIAGNOSIS — Z7982 Long term (current) use of aspirin: Secondary | ICD-10-CM | POA: Diagnosis not present

## 2023-06-28 DIAGNOSIS — Z885 Allergy status to narcotic agent status: Secondary | ICD-10-CM | POA: Diagnosis not present

## 2023-06-28 DIAGNOSIS — Z66 Do not resuscitate: Secondary | ICD-10-CM | POA: Diagnosis not present

## 2023-06-28 DIAGNOSIS — J449 Chronic obstructive pulmonary disease, unspecified: Secondary | ICD-10-CM | POA: Diagnosis not present

## 2023-06-28 DIAGNOSIS — E7800 Pure hypercholesterolemia, unspecified: Secondary | ICD-10-CM | POA: Diagnosis not present

## 2023-06-28 DIAGNOSIS — E1142 Type 2 diabetes mellitus with diabetic polyneuropathy: Secondary | ICD-10-CM | POA: Diagnosis not present

## 2023-06-28 DIAGNOSIS — Z7984 Long term (current) use of oral hypoglycemic drugs: Secondary | ICD-10-CM | POA: Diagnosis not present

## 2023-06-28 DIAGNOSIS — Z888 Allergy status to other drugs, medicaments and biological substances status: Secondary | ICD-10-CM | POA: Diagnosis not present

## 2023-06-28 DIAGNOSIS — E785 Hyperlipidemia, unspecified: Secondary | ICD-10-CM | POA: Diagnosis not present

## 2023-06-28 DIAGNOSIS — S91102D Unspecified open wound of left great toe without damage to nail, subsequent encounter: Secondary | ICD-10-CM | POA: Diagnosis not present

## 2023-06-28 DIAGNOSIS — Z79899 Other long term (current) drug therapy: Secondary | ICD-10-CM | POA: Diagnosis not present

## 2023-06-28 DIAGNOSIS — I10 Essential (primary) hypertension: Secondary | ICD-10-CM | POA: Diagnosis not present

## 2023-06-28 DIAGNOSIS — Z86711 Personal history of pulmonary embolism: Secondary | ICD-10-CM | POA: Diagnosis not present

## 2023-06-28 DIAGNOSIS — G8220 Paraplegia, unspecified: Secondary | ICD-10-CM | POA: Diagnosis not present

## 2023-06-28 DIAGNOSIS — K2100 Gastro-esophageal reflux disease with esophagitis, without bleeding: Secondary | ICD-10-CM | POA: Diagnosis not present

## 2023-06-28 DIAGNOSIS — R0902 Hypoxemia: Secondary | ICD-10-CM | POA: Diagnosis not present

## 2023-06-28 DIAGNOSIS — L97521 Non-pressure chronic ulcer of other part of left foot limited to breakdown of skin: Secondary | ICD-10-CM | POA: Diagnosis not present

## 2023-06-28 DIAGNOSIS — Z96641 Presence of right artificial hip joint: Secondary | ICD-10-CM | POA: Diagnosis not present

## 2023-06-28 DIAGNOSIS — S81801D Unspecified open wound, right lower leg, subsequent encounter: Secondary | ICD-10-CM | POA: Diagnosis not present

## 2023-06-28 DIAGNOSIS — S91001S Unspecified open wound, right ankle, sequela: Secondary | ICD-10-CM | POA: Diagnosis not present

## 2023-06-28 DIAGNOSIS — Z87891 Personal history of nicotine dependence: Secondary | ICD-10-CM | POA: Diagnosis not present

## 2023-06-28 DIAGNOSIS — S91001D Unspecified open wound, right ankle, subsequent encounter: Secondary | ICD-10-CM | POA: Diagnosis not present

## 2023-06-30 DIAGNOSIS — J449 Chronic obstructive pulmonary disease, unspecified: Secondary | ICD-10-CM | POA: Diagnosis not present

## 2023-06-30 DIAGNOSIS — G8220 Paraplegia, unspecified: Secondary | ICD-10-CM | POA: Diagnosis not present

## 2023-06-30 DIAGNOSIS — E1142 Type 2 diabetes mellitus with diabetic polyneuropathy: Secondary | ICD-10-CM | POA: Diagnosis not present

## 2023-06-30 DIAGNOSIS — L97521 Non-pressure chronic ulcer of other part of left foot limited to breakdown of skin: Secondary | ICD-10-CM | POA: Diagnosis not present

## 2023-06-30 DIAGNOSIS — E11621 Type 2 diabetes mellitus with foot ulcer: Secondary | ICD-10-CM | POA: Diagnosis not present

## 2023-06-30 DIAGNOSIS — L89512 Pressure ulcer of right ankle, stage 2: Secondary | ICD-10-CM | POA: Diagnosis not present

## 2023-07-03 DIAGNOSIS — E1142 Type 2 diabetes mellitus with diabetic polyneuropathy: Secondary | ICD-10-CM | POA: Diagnosis not present

## 2023-07-03 DIAGNOSIS — L89512 Pressure ulcer of right ankle, stage 2: Secondary | ICD-10-CM | POA: Diagnosis not present

## 2023-07-03 DIAGNOSIS — E11621 Type 2 diabetes mellitus with foot ulcer: Secondary | ICD-10-CM | POA: Diagnosis not present

## 2023-07-03 DIAGNOSIS — J449 Chronic obstructive pulmonary disease, unspecified: Secondary | ICD-10-CM | POA: Diagnosis not present

## 2023-07-03 DIAGNOSIS — G8220 Paraplegia, unspecified: Secondary | ICD-10-CM | POA: Diagnosis not present

## 2023-07-03 DIAGNOSIS — L97521 Non-pressure chronic ulcer of other part of left foot limited to breakdown of skin: Secondary | ICD-10-CM | POA: Diagnosis not present

## 2023-07-05 DIAGNOSIS — M4714 Other spondylosis with myelopathy, thoracic region: Secondary | ICD-10-CM | POA: Diagnosis not present

## 2023-07-05 DIAGNOSIS — M4804 Spinal stenosis, thoracic region: Secondary | ICD-10-CM | POA: Diagnosis not present

## 2023-07-05 DIAGNOSIS — M48062 Spinal stenosis, lumbar region with neurogenic claudication: Secondary | ICD-10-CM | POA: Diagnosis not present

## 2023-07-05 DIAGNOSIS — Z79891 Long term (current) use of opiate analgesic: Secondary | ICD-10-CM | POA: Diagnosis not present

## 2023-07-05 DIAGNOSIS — G894 Chronic pain syndrome: Secondary | ICD-10-CM | POA: Diagnosis not present

## 2023-07-06 DIAGNOSIS — K2100 Gastro-esophageal reflux disease with esophagitis, without bleeding: Secondary | ICD-10-CM | POA: Diagnosis not present

## 2023-07-06 DIAGNOSIS — E1142 Type 2 diabetes mellitus with diabetic polyneuropathy: Secondary | ICD-10-CM | POA: Diagnosis not present

## 2023-07-06 DIAGNOSIS — Z66 Do not resuscitate: Secondary | ICD-10-CM | POA: Diagnosis not present

## 2023-07-06 DIAGNOSIS — E7800 Pure hypercholesterolemia, unspecified: Secondary | ICD-10-CM | POA: Diagnosis not present

## 2023-07-06 DIAGNOSIS — S81801D Unspecified open wound, right lower leg, subsequent encounter: Secondary | ICD-10-CM | POA: Diagnosis not present

## 2023-07-06 DIAGNOSIS — Z7984 Long term (current) use of oral hypoglycemic drugs: Secondary | ICD-10-CM | POA: Diagnosis not present

## 2023-07-06 DIAGNOSIS — S91102D Unspecified open wound of left great toe without damage to nail, subsequent encounter: Secondary | ICD-10-CM | POA: Diagnosis not present

## 2023-07-06 DIAGNOSIS — Z79899 Other long term (current) drug therapy: Secondary | ICD-10-CM | POA: Diagnosis not present

## 2023-07-06 DIAGNOSIS — Z87891 Personal history of nicotine dependence: Secondary | ICD-10-CM | POA: Diagnosis not present

## 2023-07-06 DIAGNOSIS — J449 Chronic obstructive pulmonary disease, unspecified: Secondary | ICD-10-CM | POA: Diagnosis not present

## 2023-07-06 DIAGNOSIS — Z7982 Long term (current) use of aspirin: Secondary | ICD-10-CM | POA: Diagnosis not present

## 2023-07-06 DIAGNOSIS — I10 Essential (primary) hypertension: Secondary | ICD-10-CM | POA: Diagnosis not present

## 2023-07-06 DIAGNOSIS — S91001D Unspecified open wound, right ankle, subsequent encounter: Secondary | ICD-10-CM | POA: Diagnosis not present

## 2023-07-06 DIAGNOSIS — Z86711 Personal history of pulmonary embolism: Secondary | ICD-10-CM | POA: Diagnosis not present

## 2023-07-06 DIAGNOSIS — Z885 Allergy status to narcotic agent status: Secondary | ICD-10-CM | POA: Diagnosis not present

## 2023-07-07 DIAGNOSIS — L97521 Non-pressure chronic ulcer of other part of left foot limited to breakdown of skin: Secondary | ICD-10-CM | POA: Diagnosis not present

## 2023-07-07 DIAGNOSIS — E1142 Type 2 diabetes mellitus with diabetic polyneuropathy: Secondary | ICD-10-CM | POA: Diagnosis not present

## 2023-07-07 DIAGNOSIS — L89512 Pressure ulcer of right ankle, stage 2: Secondary | ICD-10-CM | POA: Diagnosis not present

## 2023-07-07 DIAGNOSIS — J449 Chronic obstructive pulmonary disease, unspecified: Secondary | ICD-10-CM | POA: Diagnosis not present

## 2023-07-07 DIAGNOSIS — E11621 Type 2 diabetes mellitus with foot ulcer: Secondary | ICD-10-CM | POA: Diagnosis not present

## 2023-07-07 DIAGNOSIS — G8220 Paraplegia, unspecified: Secondary | ICD-10-CM | POA: Diagnosis not present

## 2023-07-10 DIAGNOSIS — G8220 Paraplegia, unspecified: Secondary | ICD-10-CM | POA: Diagnosis not present

## 2023-07-10 DIAGNOSIS — E11621 Type 2 diabetes mellitus with foot ulcer: Secondary | ICD-10-CM | POA: Diagnosis not present

## 2023-07-10 DIAGNOSIS — J449 Chronic obstructive pulmonary disease, unspecified: Secondary | ICD-10-CM | POA: Diagnosis not present

## 2023-07-10 DIAGNOSIS — L89512 Pressure ulcer of right ankle, stage 2: Secondary | ICD-10-CM | POA: Diagnosis not present

## 2023-07-10 DIAGNOSIS — E1142 Type 2 diabetes mellitus with diabetic polyneuropathy: Secondary | ICD-10-CM | POA: Diagnosis not present

## 2023-07-10 DIAGNOSIS — L97521 Non-pressure chronic ulcer of other part of left foot limited to breakdown of skin: Secondary | ICD-10-CM | POA: Diagnosis not present

## 2023-07-12 DIAGNOSIS — J449 Chronic obstructive pulmonary disease, unspecified: Secondary | ICD-10-CM | POA: Diagnosis not present

## 2023-07-12 DIAGNOSIS — Z7982 Long term (current) use of aspirin: Secondary | ICD-10-CM | POA: Diagnosis not present

## 2023-07-12 DIAGNOSIS — Z86711 Personal history of pulmonary embolism: Secondary | ICD-10-CM | POA: Diagnosis not present

## 2023-07-12 DIAGNOSIS — I10 Essential (primary) hypertension: Secondary | ICD-10-CM | POA: Diagnosis not present

## 2023-07-12 DIAGNOSIS — Z87891 Personal history of nicotine dependence: Secondary | ICD-10-CM | POA: Diagnosis not present

## 2023-07-12 DIAGNOSIS — K2100 Gastro-esophageal reflux disease with esophagitis, without bleeding: Secondary | ICD-10-CM | POA: Diagnosis not present

## 2023-07-12 DIAGNOSIS — S91102D Unspecified open wound of left great toe without damage to nail, subsequent encounter: Secondary | ICD-10-CM | POA: Diagnosis not present

## 2023-07-12 DIAGNOSIS — E7800 Pure hypercholesterolemia, unspecified: Secondary | ICD-10-CM | POA: Diagnosis not present

## 2023-07-12 DIAGNOSIS — Z885 Allergy status to narcotic agent status: Secondary | ICD-10-CM | POA: Diagnosis not present

## 2023-07-12 DIAGNOSIS — S81801D Unspecified open wound, right lower leg, subsequent encounter: Secondary | ICD-10-CM | POA: Diagnosis not present

## 2023-07-12 DIAGNOSIS — E1142 Type 2 diabetes mellitus with diabetic polyneuropathy: Secondary | ICD-10-CM | POA: Diagnosis not present

## 2023-07-12 DIAGNOSIS — Z7984 Long term (current) use of oral hypoglycemic drugs: Secondary | ICD-10-CM | POA: Diagnosis not present

## 2023-07-12 DIAGNOSIS — Z79899 Other long term (current) drug therapy: Secondary | ICD-10-CM | POA: Diagnosis not present

## 2023-07-12 DIAGNOSIS — S91001D Unspecified open wound, right ankle, subsequent encounter: Secondary | ICD-10-CM | POA: Diagnosis not present

## 2023-07-12 DIAGNOSIS — Z66 Do not resuscitate: Secondary | ICD-10-CM | POA: Diagnosis not present

## 2023-07-14 DIAGNOSIS — L97521 Non-pressure chronic ulcer of other part of left foot limited to breakdown of skin: Secondary | ICD-10-CM | POA: Diagnosis not present

## 2023-07-14 DIAGNOSIS — L89512 Pressure ulcer of right ankle, stage 2: Secondary | ICD-10-CM | POA: Diagnosis not present

## 2023-07-14 DIAGNOSIS — J449 Chronic obstructive pulmonary disease, unspecified: Secondary | ICD-10-CM | POA: Diagnosis not present

## 2023-07-14 DIAGNOSIS — E11621 Type 2 diabetes mellitus with foot ulcer: Secondary | ICD-10-CM | POA: Diagnosis not present

## 2023-07-14 DIAGNOSIS — G8220 Paraplegia, unspecified: Secondary | ICD-10-CM | POA: Diagnosis not present

## 2023-07-14 DIAGNOSIS — E1142 Type 2 diabetes mellitus with diabetic polyneuropathy: Secondary | ICD-10-CM | POA: Diagnosis not present

## 2023-07-16 DIAGNOSIS — J449 Chronic obstructive pulmonary disease, unspecified: Secondary | ICD-10-CM | POA: Diagnosis not present

## 2023-07-16 DIAGNOSIS — G8220 Paraplegia, unspecified: Secondary | ICD-10-CM | POA: Diagnosis not present

## 2023-07-16 DIAGNOSIS — G8250 Quadriplegia, unspecified: Secondary | ICD-10-CM | POA: Diagnosis not present

## 2023-07-17 DIAGNOSIS — G8220 Paraplegia, unspecified: Secondary | ICD-10-CM | POA: Diagnosis not present

## 2023-07-17 DIAGNOSIS — E11621 Type 2 diabetes mellitus with foot ulcer: Secondary | ICD-10-CM | POA: Diagnosis not present

## 2023-07-17 DIAGNOSIS — E1142 Type 2 diabetes mellitus with diabetic polyneuropathy: Secondary | ICD-10-CM | POA: Diagnosis not present

## 2023-07-17 DIAGNOSIS — L97521 Non-pressure chronic ulcer of other part of left foot limited to breakdown of skin: Secondary | ICD-10-CM | POA: Diagnosis not present

## 2023-07-17 DIAGNOSIS — J449 Chronic obstructive pulmonary disease, unspecified: Secondary | ICD-10-CM | POA: Diagnosis not present

## 2023-07-17 DIAGNOSIS — L89512 Pressure ulcer of right ankle, stage 2: Secondary | ICD-10-CM | POA: Diagnosis not present

## 2023-07-20 DIAGNOSIS — S91001D Unspecified open wound, right ankle, subsequent encounter: Secondary | ICD-10-CM | POA: Diagnosis not present

## 2023-07-20 DIAGNOSIS — Z79899 Other long term (current) drug therapy: Secondary | ICD-10-CM | POA: Diagnosis not present

## 2023-07-20 DIAGNOSIS — S81801D Unspecified open wound, right lower leg, subsequent encounter: Secondary | ICD-10-CM | POA: Diagnosis not present

## 2023-07-20 DIAGNOSIS — Z86711 Personal history of pulmonary embolism: Secondary | ICD-10-CM | POA: Diagnosis not present

## 2023-07-20 DIAGNOSIS — S91102D Unspecified open wound of left great toe without damage to nail, subsequent encounter: Secondary | ICD-10-CM | POA: Diagnosis not present

## 2023-07-20 DIAGNOSIS — E1142 Type 2 diabetes mellitus with diabetic polyneuropathy: Secondary | ICD-10-CM | POA: Diagnosis not present

## 2023-07-20 DIAGNOSIS — Z885 Allergy status to narcotic agent status: Secondary | ICD-10-CM | POA: Diagnosis not present

## 2023-07-20 DIAGNOSIS — J449 Chronic obstructive pulmonary disease, unspecified: Secondary | ICD-10-CM | POA: Diagnosis not present

## 2023-07-20 DIAGNOSIS — K2100 Gastro-esophageal reflux disease with esophagitis, without bleeding: Secondary | ICD-10-CM | POA: Diagnosis not present

## 2023-07-20 DIAGNOSIS — Z7982 Long term (current) use of aspirin: Secondary | ICD-10-CM | POA: Diagnosis not present

## 2023-07-20 DIAGNOSIS — E7800 Pure hypercholesterolemia, unspecified: Secondary | ICD-10-CM | POA: Diagnosis not present

## 2023-07-20 DIAGNOSIS — Z87891 Personal history of nicotine dependence: Secondary | ICD-10-CM | POA: Diagnosis not present

## 2023-07-20 DIAGNOSIS — I10 Essential (primary) hypertension: Secondary | ICD-10-CM | POA: Diagnosis not present

## 2023-07-20 DIAGNOSIS — Z66 Do not resuscitate: Secondary | ICD-10-CM | POA: Diagnosis not present

## 2023-07-20 DIAGNOSIS — Z7984 Long term (current) use of oral hypoglycemic drugs: Secondary | ICD-10-CM | POA: Diagnosis not present

## 2023-07-21 DIAGNOSIS — E11621 Type 2 diabetes mellitus with foot ulcer: Secondary | ICD-10-CM | POA: Diagnosis not present

## 2023-07-21 DIAGNOSIS — L97521 Non-pressure chronic ulcer of other part of left foot limited to breakdown of skin: Secondary | ICD-10-CM | POA: Diagnosis not present

## 2023-07-21 DIAGNOSIS — J449 Chronic obstructive pulmonary disease, unspecified: Secondary | ICD-10-CM | POA: Diagnosis not present

## 2023-07-21 DIAGNOSIS — E1142 Type 2 diabetes mellitus with diabetic polyneuropathy: Secondary | ICD-10-CM | POA: Diagnosis not present

## 2023-07-21 DIAGNOSIS — L89512 Pressure ulcer of right ankle, stage 2: Secondary | ICD-10-CM | POA: Diagnosis not present

## 2023-07-21 DIAGNOSIS — G8220 Paraplegia, unspecified: Secondary | ICD-10-CM | POA: Diagnosis not present

## 2023-07-24 DIAGNOSIS — E1142 Type 2 diabetes mellitus with diabetic polyneuropathy: Secondary | ICD-10-CM | POA: Diagnosis not present

## 2023-07-24 DIAGNOSIS — J449 Chronic obstructive pulmonary disease, unspecified: Secondary | ICD-10-CM | POA: Diagnosis not present

## 2023-07-24 DIAGNOSIS — L89512 Pressure ulcer of right ankle, stage 2: Secondary | ICD-10-CM | POA: Diagnosis not present

## 2023-07-24 DIAGNOSIS — L97521 Non-pressure chronic ulcer of other part of left foot limited to breakdown of skin: Secondary | ICD-10-CM | POA: Diagnosis not present

## 2023-07-24 DIAGNOSIS — E11621 Type 2 diabetes mellitus with foot ulcer: Secondary | ICD-10-CM | POA: Diagnosis not present

## 2023-07-24 DIAGNOSIS — G8220 Paraplegia, unspecified: Secondary | ICD-10-CM | POA: Diagnosis not present

## 2023-07-26 DIAGNOSIS — E7800 Pure hypercholesterolemia, unspecified: Secondary | ICD-10-CM | POA: Diagnosis not present

## 2023-07-26 DIAGNOSIS — Z66 Do not resuscitate: Secondary | ICD-10-CM | POA: Diagnosis not present

## 2023-07-26 DIAGNOSIS — Z885 Allergy status to narcotic agent status: Secondary | ICD-10-CM | POA: Diagnosis not present

## 2023-07-26 DIAGNOSIS — S91001D Unspecified open wound, right ankle, subsequent encounter: Secondary | ICD-10-CM | POA: Diagnosis not present

## 2023-07-26 DIAGNOSIS — Z79899 Other long term (current) drug therapy: Secondary | ICD-10-CM | POA: Diagnosis not present

## 2023-07-26 DIAGNOSIS — Z7984 Long term (current) use of oral hypoglycemic drugs: Secondary | ICD-10-CM | POA: Diagnosis not present

## 2023-07-26 DIAGNOSIS — Z86711 Personal history of pulmonary embolism: Secondary | ICD-10-CM | POA: Diagnosis not present

## 2023-07-26 DIAGNOSIS — I10 Essential (primary) hypertension: Secondary | ICD-10-CM | POA: Diagnosis not present

## 2023-07-26 DIAGNOSIS — K2100 Gastro-esophageal reflux disease with esophagitis, without bleeding: Secondary | ICD-10-CM | POA: Diagnosis not present

## 2023-07-26 DIAGNOSIS — Z87891 Personal history of nicotine dependence: Secondary | ICD-10-CM | POA: Diagnosis not present

## 2023-07-26 DIAGNOSIS — S81801D Unspecified open wound, right lower leg, subsequent encounter: Secondary | ICD-10-CM | POA: Diagnosis not present

## 2023-07-26 DIAGNOSIS — S91102D Unspecified open wound of left great toe without damage to nail, subsequent encounter: Secondary | ICD-10-CM | POA: Diagnosis not present

## 2023-07-26 DIAGNOSIS — E1142 Type 2 diabetes mellitus with diabetic polyneuropathy: Secondary | ICD-10-CM | POA: Diagnosis not present

## 2023-07-26 DIAGNOSIS — Z7982 Long term (current) use of aspirin: Secondary | ICD-10-CM | POA: Diagnosis not present

## 2023-07-26 DIAGNOSIS — J449 Chronic obstructive pulmonary disease, unspecified: Secondary | ICD-10-CM | POA: Diagnosis not present

## 2023-07-28 DIAGNOSIS — E1142 Type 2 diabetes mellitus with diabetic polyneuropathy: Secondary | ICD-10-CM | POA: Diagnosis not present

## 2023-07-28 DIAGNOSIS — L89512 Pressure ulcer of right ankle, stage 2: Secondary | ICD-10-CM | POA: Diagnosis not present

## 2023-07-28 DIAGNOSIS — L97521 Non-pressure chronic ulcer of other part of left foot limited to breakdown of skin: Secondary | ICD-10-CM | POA: Diagnosis not present

## 2023-07-28 DIAGNOSIS — G8220 Paraplegia, unspecified: Secondary | ICD-10-CM | POA: Diagnosis not present

## 2023-07-28 DIAGNOSIS — J449 Chronic obstructive pulmonary disease, unspecified: Secondary | ICD-10-CM | POA: Diagnosis not present

## 2023-07-28 DIAGNOSIS — E11621 Type 2 diabetes mellitus with foot ulcer: Secondary | ICD-10-CM | POA: Diagnosis not present

## 2023-07-31 DIAGNOSIS — E11621 Type 2 diabetes mellitus with foot ulcer: Secondary | ICD-10-CM | POA: Diagnosis not present

## 2023-07-31 DIAGNOSIS — L97521 Non-pressure chronic ulcer of other part of left foot limited to breakdown of skin: Secondary | ICD-10-CM | POA: Diagnosis not present

## 2023-07-31 DIAGNOSIS — E1142 Type 2 diabetes mellitus with diabetic polyneuropathy: Secondary | ICD-10-CM | POA: Diagnosis not present

## 2023-07-31 DIAGNOSIS — G8220 Paraplegia, unspecified: Secondary | ICD-10-CM | POA: Diagnosis not present

## 2023-07-31 DIAGNOSIS — J449 Chronic obstructive pulmonary disease, unspecified: Secondary | ICD-10-CM | POA: Diagnosis not present

## 2023-07-31 DIAGNOSIS — L89512 Pressure ulcer of right ankle, stage 2: Secondary | ICD-10-CM | POA: Diagnosis not present

## 2023-08-02 DIAGNOSIS — Z79899 Other long term (current) drug therapy: Secondary | ICD-10-CM | POA: Diagnosis not present

## 2023-08-02 DIAGNOSIS — Z885 Allergy status to narcotic agent status: Secondary | ICD-10-CM | POA: Diagnosis not present

## 2023-08-02 DIAGNOSIS — Z86711 Personal history of pulmonary embolism: Secondary | ICD-10-CM | POA: Diagnosis not present

## 2023-08-02 DIAGNOSIS — S91102D Unspecified open wound of left great toe without damage to nail, subsequent encounter: Secondary | ICD-10-CM | POA: Diagnosis not present

## 2023-08-02 DIAGNOSIS — X58XXXD Exposure to other specified factors, subsequent encounter: Secondary | ICD-10-CM | POA: Diagnosis not present

## 2023-08-02 DIAGNOSIS — S81801D Unspecified open wound, right lower leg, subsequent encounter: Secondary | ICD-10-CM | POA: Diagnosis not present

## 2023-08-02 DIAGNOSIS — S91001S Unspecified open wound, right ankle, sequela: Secondary | ICD-10-CM | POA: Diagnosis not present

## 2023-08-02 DIAGNOSIS — Z7982 Long term (current) use of aspirin: Secondary | ICD-10-CM | POA: Diagnosis not present

## 2023-08-02 DIAGNOSIS — E1142 Type 2 diabetes mellitus with diabetic polyneuropathy: Secondary | ICD-10-CM | POA: Diagnosis not present

## 2023-08-02 DIAGNOSIS — Z7984 Long term (current) use of oral hypoglycemic drugs: Secondary | ICD-10-CM | POA: Diagnosis not present

## 2023-08-02 DIAGNOSIS — I10 Essential (primary) hypertension: Secondary | ICD-10-CM | POA: Diagnosis not present

## 2023-08-02 DIAGNOSIS — Z66 Do not resuscitate: Secondary | ICD-10-CM | POA: Diagnosis not present

## 2023-08-02 DIAGNOSIS — J449 Chronic obstructive pulmonary disease, unspecified: Secondary | ICD-10-CM | POA: Diagnosis not present

## 2023-08-02 DIAGNOSIS — K2100 Gastro-esophageal reflux disease with esophagitis, without bleeding: Secondary | ICD-10-CM | POA: Diagnosis not present

## 2023-08-02 DIAGNOSIS — E7800 Pure hypercholesterolemia, unspecified: Secondary | ICD-10-CM | POA: Diagnosis not present

## 2023-08-02 DIAGNOSIS — S91001D Unspecified open wound, right ankle, subsequent encounter: Secondary | ICD-10-CM | POA: Diagnosis not present

## 2023-08-02 DIAGNOSIS — Z87891 Personal history of nicotine dependence: Secondary | ICD-10-CM | POA: Diagnosis not present

## 2023-08-04 DIAGNOSIS — E11621 Type 2 diabetes mellitus with foot ulcer: Secondary | ICD-10-CM | POA: Diagnosis not present

## 2023-08-04 DIAGNOSIS — L89512 Pressure ulcer of right ankle, stage 2: Secondary | ICD-10-CM | POA: Diagnosis not present

## 2023-08-04 DIAGNOSIS — L97521 Non-pressure chronic ulcer of other part of left foot limited to breakdown of skin: Secondary | ICD-10-CM | POA: Diagnosis not present

## 2023-08-04 DIAGNOSIS — E1142 Type 2 diabetes mellitus with diabetic polyneuropathy: Secondary | ICD-10-CM | POA: Diagnosis not present

## 2023-08-04 DIAGNOSIS — G8220 Paraplegia, unspecified: Secondary | ICD-10-CM | POA: Diagnosis not present

## 2023-08-04 DIAGNOSIS — J449 Chronic obstructive pulmonary disease, unspecified: Secondary | ICD-10-CM | POA: Diagnosis not present

## 2023-08-07 DIAGNOSIS — E11621 Type 2 diabetes mellitus with foot ulcer: Secondary | ICD-10-CM | POA: Diagnosis not present

## 2023-08-07 DIAGNOSIS — E1142 Type 2 diabetes mellitus with diabetic polyneuropathy: Secondary | ICD-10-CM | POA: Diagnosis not present

## 2023-08-07 DIAGNOSIS — L89512 Pressure ulcer of right ankle, stage 2: Secondary | ICD-10-CM | POA: Diagnosis not present

## 2023-08-07 DIAGNOSIS — G8220 Paraplegia, unspecified: Secondary | ICD-10-CM | POA: Diagnosis not present

## 2023-08-07 DIAGNOSIS — L97521 Non-pressure chronic ulcer of other part of left foot limited to breakdown of skin: Secondary | ICD-10-CM | POA: Diagnosis not present

## 2023-08-07 DIAGNOSIS — J449 Chronic obstructive pulmonary disease, unspecified: Secondary | ICD-10-CM | POA: Diagnosis not present

## 2023-08-11 DIAGNOSIS — E11621 Type 2 diabetes mellitus with foot ulcer: Secondary | ICD-10-CM | POA: Diagnosis not present

## 2023-08-11 DIAGNOSIS — E1142 Type 2 diabetes mellitus with diabetic polyneuropathy: Secondary | ICD-10-CM | POA: Diagnosis not present

## 2023-08-11 DIAGNOSIS — G8220 Paraplegia, unspecified: Secondary | ICD-10-CM | POA: Diagnosis not present

## 2023-08-11 DIAGNOSIS — L97521 Non-pressure chronic ulcer of other part of left foot limited to breakdown of skin: Secondary | ICD-10-CM | POA: Diagnosis not present

## 2023-08-11 DIAGNOSIS — J449 Chronic obstructive pulmonary disease, unspecified: Secondary | ICD-10-CM | POA: Diagnosis not present

## 2023-08-11 DIAGNOSIS — L89512 Pressure ulcer of right ankle, stage 2: Secondary | ICD-10-CM | POA: Diagnosis not present

## 2023-08-14 DIAGNOSIS — G8220 Paraplegia, unspecified: Secondary | ICD-10-CM | POA: Diagnosis not present

## 2023-08-14 DIAGNOSIS — E11621 Type 2 diabetes mellitus with foot ulcer: Secondary | ICD-10-CM | POA: Diagnosis not present

## 2023-08-14 DIAGNOSIS — L97521 Non-pressure chronic ulcer of other part of left foot limited to breakdown of skin: Secondary | ICD-10-CM | POA: Diagnosis not present

## 2023-08-14 DIAGNOSIS — L89512 Pressure ulcer of right ankle, stage 2: Secondary | ICD-10-CM | POA: Diagnosis not present

## 2023-08-14 DIAGNOSIS — E1142 Type 2 diabetes mellitus with diabetic polyneuropathy: Secondary | ICD-10-CM | POA: Diagnosis not present

## 2023-08-14 DIAGNOSIS — J449 Chronic obstructive pulmonary disease, unspecified: Secondary | ICD-10-CM | POA: Diagnosis not present

## 2023-08-15 ENCOUNTER — Ambulatory Visit: Payer: Medicare Other | Admitting: "Endocrinology

## 2023-08-16 ENCOUNTER — Ambulatory Visit: Payer: Medicare Other | Admitting: "Endocrinology

## 2023-08-17 ENCOUNTER — Encounter: Payer: Self-pay | Admitting: "Endocrinology

## 2023-08-17 ENCOUNTER — Ambulatory Visit: Payer: Medicare Other | Admitting: "Endocrinology

## 2023-08-17 VITALS — BP 120/62 | HR 72

## 2023-08-17 DIAGNOSIS — E119 Type 2 diabetes mellitus without complications: Secondary | ICD-10-CM

## 2023-08-17 DIAGNOSIS — I10 Essential (primary) hypertension: Secondary | ICD-10-CM | POA: Diagnosis not present

## 2023-08-17 DIAGNOSIS — Z7984 Long term (current) use of oral hypoglycemic drugs: Secondary | ICD-10-CM

## 2023-08-17 DIAGNOSIS — E782 Mixed hyperlipidemia: Secondary | ICD-10-CM | POA: Diagnosis not present

## 2023-08-17 LAB — POCT GLYCOSYLATED HEMOGLOBIN (HGB A1C): HbA1c, POC (controlled diabetic range): 7 % (ref 0.0–7.0)

## 2023-08-17 NOTE — Progress Notes (Signed)
08/17/2023, 2:39 PM  Endocrinology follow-up note  Subjective:    Patient ID: Patrick Aguilar, male    DOB: [birth year].  Patrick Aguilar is being seen in follow-up after he was seen in consultation for management of currently controlled asymptomatic diabetes requested by  Kirstie Peri, MD.   Past Medical History:  Diagnosis Date   AKI (acute kidney injury) (HCC) 12/2017   Angio-edema    Anxiety    Arthritis    Complication of anesthesia    low respirations, low BP   COPD (chronic obstructive pulmonary disease) (HCC)    DDD (degenerative disc disease), lumbar    Diabetes mellitus without complication (HCC)    type 2   Diastolic dysfunction 12/14/2017   Moderate noted on ECHO   Fournier's gangrene in male    GERD (gastroesophageal reflux disease)    History of ARDS    History of necrotizing fasciitis    Severe   Hypertension    Lumbar spondylosis    Numbness and tingling of both upper extremities    Pulmonary hypertension (HCC) 12/14/2017   Mild, noted on ECHO   Recurrent upper respiratory infection (URI)    Respiratory failure, acute (HCC) 12/2017   Septic shock (HCC) 12/2017   Shortness of breath dyspnea    Testicular pain, left    Wears glasses     Past Surgical History:  Procedure Laterality Date   ADENOIDECTOMY     APPENDECTOMY     APPLICATION OF A-CELL OF CHEST/ABDOMEN N/A 01/08/2018   Procedure: APPLICATION OF A-CELL OF GROIN;  Surgeon: Peggye Form, DO;  Location: [facility];  Service: Plastics;  Laterality: N/A;   APPLICATION OF A-CELL OF EXTREMITY N/A 12/25/2017   Procedure: APPLICATION OF A-CELL;  Surgeon: Peggye Form, DO;  Location: [facility];  Service: Plastics;  Laterality: N/A;   BACK SURGERY     x5   CARDIAC CATHETERIZATION  2006   neg   CERVICAL DISC SURGERY     x2   COLONOSCOPY     DEBRIDEMENT AND CLOSURE WOUND N/A 01/26/2018   Procedure: REVISION OF PERINEUM WOUND WITH DEBRIDEMENT, PARTIAL CLOSURE OF  PERINEUM, PLACEMENT OF CELLERATE COLLAGEN;  Surgeon: Peggye Form, DO;  Location: [facility];  Service: Plastics;  Laterality: N/A;   DENTAL SURGERY     teeth extractions   groin wound  01/08/2018   : EXCISION OF GROIN WOUND WITH PLACEMENT OF ACELL, AND PRIMARY WOUND CLOSURE (N/A Scrotum)   I & D EXTREMITY N/A 03/12/2018   Procedure: IRRIGATION AND DEBRIDEMENT PERIMUM WOUND WITH CLOSURE;  Surgeon: Peggye Form, DO;  Location: [facility];  Service: Plastics;  Laterality: N/A;   INCISION AND DRAINAGE ABSCESS Left 07/11/2018   Procedure: INCISION AND DRAINAGE ABSCESS LEFT THIGH;  Surgeon: Marcine Matar, MD;  Location: [facility];  Service: Urology;  Laterality: Left;   INCISION AND DRAINAGE OF WOUND N/A 12/25/2017   Procedure: Irrigation and debridement of Fournier's of scrotum with placement of testes in subcutaneous thigh pockets and Acell placement;  Surgeon: Peggye Form, DO;  Location: [facility];  Service: Plastics;  Laterality: N/A;   INCISION AND DRAINAGE OF WOUND N/A 01/08/2018   Procedure: EXCISION OF GROIN WOUND WITH PLACEMENT OF ACELL, AND PRIMARY WOUND CLOSURE;  Surgeon: Peggye Form, DO;  Location: [facility];  Service: Plastics;  Laterality: N/A;   ORCHIECTOMY N/A 12/13/2017   Procedure: EXCISION OF SCROTUM AND DEBRIDEMENT OF PENIS;  Surgeon: Retta Diones,  Jeannett Senior, MD;  Location: [facility];  Service: Urology;  Laterality: N/A;   ORCHIECTOMY Left 06/22/2018   Procedure: ORCHIECTOMY;  Surgeon: Marcine Matar, MD;  Location: [facility];  Service: Urology;  Laterality: Left;   PLANTAR FASCIA SURGERY Bilateral    shoulders Bilateral    rotator cuff   SUBMANDIBULAR GLAND EXCISION Left 03/12/2015   Procedure: LEFT SUBMANDIBULAR GLAND RESECTION;  Surgeon: Christia Reading, MD;  Location: [facility] OR;  Service: ENT;  Laterality: Left;   TONSILLECTOMY     TOTAL HIP ARTHROPLASTY Right 03/06/2019   Procedure: TOTAL HIP ARTHROPLASTY ANTERIOR APPROACH;  Surgeon: Ollen Gross, MD;  Location: [facility];  Service: Orthopedics;  Laterality: Right;     Social History   Socioeconomic History   Marital status: Single    Spouse name: Not on file   Number of children: Not on file   Years of education: Not on file   Highest education level: Not on file  Occupational History   Not on file  Tobacco Use   Smoking status: Former    Current packs/day: 0.00    Average packs/day: 1 pack/day for 50.0 years (50.0 ttl pk-yrs)    Types: Cigarettes    Start date: 12/14/1966    Quit date: 12/13/2016    Years since quitting: 6.6   Smokeless tobacco: Never  Vaping Use   Vaping status: Never Used  Substance and Sexual Activity   Alcohol use: No    Comment: quit alcohol 80's   Drug use: No   Sexual activity: Not on file  Other Topics Concern   Not on file  Social History Narrative   Not on file   Social Determinants of Health   Financial Resource Strain: Low Risk  (05/09/2022)   Received from [facility], [facility]   Overall Financial Resource Strain (CARDIA)    Difficulty of Paying Living Expenses: Not hard at all  Food Insecurity: No Food Insecurity (05/09/2022)   Received from [facility], [facility]   Hunger Vital Sign    Worried About Running Out of Food in the Last Year: Never true    Ran Out of Food in the Last Year: Never true  Transportation Needs: No Transportation Needs (05/09/2022)   Received from [facility], [facility]   [facility] - Transportation    Lack of Transportation (Medical): No    Lack of Transportation (Non-Medical): No  Physical Activity: Not on file  Stress: No Stress Concern Present (05/26/2021)   Received from [facility], [facility] - Occupational Stress Questionnaire    Feeling of Stress : Not at all  Social Connections: Unknown (01/11/2022)   Received from [facility], [facility]   Social Network    Social Network: Not on file    Family History   Problem Relation Age of Onset   Lung cancer Mother    Bladder Cancer Father    Allergic rhinitis Neg Hx    Asthma Neg Hx    Eczema Neg Hx    Urticaria Neg Hx     Outpatient Encounter Medications as of 08/17/2023  Medication Sig   glipiZIDE (GLUCOTROL) 5 MG tablet Take 5 mg by mouth 2 (two) times daily before a meal.   albuterol (PROVENTIL) (2.5 MG/3ML) 0.083% nebulizer solution Inhale 2.5 mg into the lungs every 6 (six) hours as needed for shortness of breath or wheezing.   albuterol (VENTOLIN HFA) 108 (90  Base) MCG/ACT inhaler Inhale 2 puffs into the lungs every 6 (six) hours as needed for wheezing or shortness of breath.   aspirin 81 MG EC tablet Take by mouth.   atenolol-chlorthalidone (TENORETIC) 50-25 MG tablet Take 1 tablet by mouth daily with breakfast.    blood glucose meter kit and supplies KIT Dispense based on patient and insurance preference. Use up to four times daily as directed. (FOR ICD-9 250.00, 250.01).   celecoxib (CELEBREX) 200 MG capsule Take 1 capsule by mouth daily.   doxazosin (CARDURA XL) 8 MG 24 hr tablet Take by mouth.   famotidine (PEPCID) 20 MG tablet Take 20 mg by mouth 2 (two) times daily.   furosemide (LASIX) 20 MG tablet Take 20 mg by mouth daily after breakfast.    gabapentin (NEURONTIN) 800 MG tablet Take 800 mg by mouth every 6 (six) hours.    Insulin Pen Needle (BD PEN NEEDLE NANO U/F) 32G X 4 MM MISC 1 each by Does not apply route 4 (four) times daily. (Patient not taking: Reported on 10/12/2022)   metFORMIN (GLUCOPHAGE) 500 MG tablet Take 500 mg by mouth once.   oxyCODONE-acetaminophen (PERCOCET) 10-325 MG tablet Take 1 tablet by mouth every 6 (six) hours.   OXYGEN Inhale 1-3 L into the lungs.   pantoprazole (PROTONIX) 40 MG tablet Take 40 mg by mouth daily.   potassium chloride (KLOR-CON) 20 MEQ packet Take by mouth.   rosuvastatin (CRESTOR) 5 MG tablet Take 5 mg by mouth every Monday.   sulfamethoxazole-trimethoprim (BACTRIM) 400-80 MG tablet Take  1 tablet by mouth 2 (two) times daily.   traZODone (DESYREL) 100 MG tablet Take 100 mg by mouth at bedtime.   No facility-administered encounter medications on file as of 08/17/2023.    ALLERGIES: Allergies  Allergen Reactions   Tramadol Other (See Comments)    tremor   Adenosine     can't tolerate  05/2005   Bupropion     Nausea   Morphine And Codeine Itching    VACCINATION STATUS: Immunization History  Administered Date(s) Administered   Td,absorbed, Preservative Free, Adult Use, Lf Unspecified 03/05/2007   Tdap 08/10/2017   Zoster Recombinant(Shingrix) 08/10/2017    Diabetes He presents for his follow-up diabetic visit. He has type 2 diabetes mellitus. Onset time: She was diagnosed at approximate age of 24 years. His disease course has been worsening. There are no hypoglycemic associated symptoms. Pertinent negatives for hypoglycemia include no confusion, headaches, pallor or seizures. There are no diabetic associated symptoms. Pertinent negatives for diabetes include no chest pain, no fatigue, no polydipsia, no polyphagia, no polyuria and no weakness. There are no hypoglycemic complications. Symptoms are worsening. Diabetic complications include nephropathy. Risk factors for coronary artery disease include diabetes mellitus, dyslipidemia, hypertension, male sex and sedentary lifestyle. Current diabetic treatments: Is currently on Metformin 500 mg p.o. once  daily, glipizide 5 mg po BID. His weight is fluctuating minimally. He is following a generally unhealthy diet. When asked about meal planning, he reported none. He has not had a previous visit with a dietitian. He participates in exercise intermittently. His home blood glucose trend is increasing steadily. His overall blood glucose range is 130-140 mg/dl. (His presents without any meter nor logs.  His point-of-care A1c is 7% increasing from 5.6%.  He denies hypoglycemia.  He has not restarted his glipizide 5 mg p.o. twice daily along  with metformin 500 mg p.o. once a day.    ) An ACE inhibitor/angiotensin II receptor blocker is being  taken.  Hyperlipidemia This is a chronic problem. The current episode started more than 1 year ago. Exacerbating diseases include diabetes. Pertinent negatives include no chest pain, myalgias or shortness of breath. Current antihyperlipidemic treatment includes statins. Risk factors for coronary artery disease include diabetes mellitus, dyslipidemia, male sex, hypertension and a sedentary lifestyle.  Hypertension This is a chronic problem. The current episode started more than 1 year ago. The problem is controlled. Pertinent negatives include no chest pain, headaches, neck pain, palpitations or shortness of breath. Risk factors for coronary artery disease include diabetes mellitus, dyslipidemia, sedentary lifestyle and smoking/tobacco exposure. Past treatments include beta blockers and diuretics. Hypertensive end-organ damage includes heart failure.    Review of Systems  Constitutional:  Negative for chills, fatigue, fever and unexpected weight change.  HENT:  Negative for dental problem, mouth sores and trouble swallowing.   Eyes:  Negative for visual disturbance.  Respiratory:  Negative for cough, choking, chest tightness, shortness of breath and wheezing.   Cardiovascular:  Negative for chest pain, palpitations and leg swelling.  Gastrointestinal:  Negative for abdominal distention, abdominal pain, constipation, diarrhea, nausea and vomiting.  Endocrine: Negative for polydipsia, polyphagia and polyuria.  Genitourinary:  Negative for dysuria, flank pain, hematuria and urgency.  Musculoskeletal:  Positive for gait problem. Negative for back pain, myalgias and neck pain.       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   Skin:  Negative for pallor, rash and wound.  Neurological:  Negative for seizures, syncope, weakness, numbness and headaches.  Psychiatric/Behavioral:   Negative for confusion and dysphoric mood.     Objective:       08/17/2023   11:26 AM 02/13/2023    3:01 PM 10/12/2022    2:56 PM  Vitals with BMI  Systolic 120 138 132  Diastolic 62 70 82  Pulse 72 60 56    BP 120/62   Pulse 72   Wt Readings from Last 3 Encounters:  09/07/22 182 lb 15.7 oz (83 kg)  10/12/21 183 lb (83 kg)  08/16/21 183 lb 3.2 oz (83.1 kg)       CMP ( most recent) CMP     Component Value Date/Time   NA 143 02/22/2023 0000   K 4.0 02/22/2023 0000   CL 100 02/22/2023 0000   CO2 29 (A) 02/22/2023 0000   GLUCOSE 139 (H) 09/07/2022 2033   BUN 33 (A) 02/22/2023 0000   CREATININE 1.0 02/22/2023 0000   CREATININE 1.0 02/22/2023 0000   CREATININE 1.40 (H) 09/07/2022 2033   CREATININE 1.13 05/13/2020 0000   CALCIUM 9.7 02/22/2023 0000   CALCIUM 9.7 02/22/2023 0000   PROT 6.6 04/06/2022 1129   ALBUMIN 3.9 04/06/2022 1129   AST 12 04/06/2022 1129   ALT 13 04/06/2022 1129   ALKPHOS 80 04/06/2022 1129   BILITOT 0.3 04/06/2022 1129   GFRNONAA 54 (L) 09/07/2022 2033   GFRNONAA 66 05/13/2020 0000   GFRAA 76 05/13/2020 0000     Diabetic Labs (most recent): Lab Results  Component Value Date   HGBA1C 7.0 08/17/2023   HGBA1C 5.7 10/12/2022   HGBA1C 6.0 04/11/2022   MICROALBUR <0.2 05/13/2020     Lipid Panel ( most recent) Lipid Panel     Component Value Date/Time   CHOL 130 04/06/2022 1129   TRIG 120 04/06/2022 1129   HDL 46 04/06/2022 1129   CHOLHDL 2.8 04/06/2022 1129   LDLCALC 62 04/06/2022 1129   LABVLDL 22 04/06/2022 1129  Assessment & Plan:   1. Type 2 diabetes mellitus without complication, without long-term current use of insulin (HCC)  - KHADIN BRESETTE has currently controlled asymptomatic type 2 DM since  72 years of age.   His presents without any meter nor logs.  His point-of-care A1c is 7% increasing from 5.6%.  He denies hypoglycemia.  He has not restarted his glipizide 5 mg p.o. twice daily along with metformin 500 mg p.o.  once a day.   - I had a long discussion with him about the progressive nature of diabetes and the pathology behind its complications. -his diabetes is complicated by  hx of  heavy smoking, deconditioning/sedentary life, diastolic dysfunction and he remains at a high risk for more acute and chronic complications which include CAD, CVA, CKD, retinopathy, and neuropathy. These are all discussed in detail with him.  - I have counseled him on diet  and weight management  by adopting a carbohydrate restricted/protein rich diet. Patient is encouraged to switch to  unprocessed or minimally processed     complex starch and increased protein intake (animal or plant source), fruits, and vegetables. -  he is advised to stick to a routine mealtimes to eat 3 meals  a day and avoid unnecessary snacks ( to snack only to correct hypoglycemia).   - he acknowledges that there is a room for improvement in his food and drink choices. - Suggestion is made for him to avoid simple carbohydrates  from his diet including Cakes, Sweet Desserts, Ice Cream, Soda (diet and regular), Sweet Tea, Candies, Chips, Cookies, Store Bought Juices, Alcohol in Excess of  1-2 drinks a day, Artificial Sweeteners,  Coffee Creamer, and "Sugar-free" Products, Lemonade. This will help patient to have more stable blood glucose profile and potentially avoid unintended weight gain.  - he will be scheduled with Norm Salt, RDN, CDE for diabetes education.  - I have approached him with the following individualized plan to manage  his diabetes and patient agrees:   -In light of his presentation with A1c of 7%, he will need his current regiment with metformin 500 mg p.o. once a day at breakfast and glipizide 5 mg p.o. daily with breakfast and with supper.  He is urged to start monitoring blood glucose twice a day-daily before breakfast and at bedtime.    He is encouraged to call clinic for hypoglycemia under 70 mg per DL, or hyperglycemia above 200  mg per DL at fasting.     He is not a candidate for SGLT2 inhibitors due to his prior history of Fournier's gangrene.  - Specific targets for  A1c;  LDL, HDL,  and Triglycerides were discussed with the patient.  2) Blood Pressure /Hypertension:  His blood pressure is controlled to target.  He is advised to discontinue amlodipine.  He is advised to continue  atenolol/HCTZ mg p.o. daily with breakfast .  3) Lipids/Hyperlipidemia:   Review of his recent lipid panel showed improving LDL of 62.  He is advised to continue Crestor 5 mg p.o. nightly. Side effects and precautions discussed with him.    4)  Weight/Diet: Current documented weight is 182 pounds.  He is not a candidate for major weight loss.  I discussed with him the fact that loss of 5 - 10% of his  current body weight will have the most impact on his diabetes management.  Exercise, and detailed carbohydrates information provided  -  detailed on discharge instructions.  5) Chronic Care/Health Maintenance:  -he  is on Statin medications and  is encouraged to initiate and continue to follow up with Ophthalmology, Dentist,  Podiatrist at least yearly or according to recommendations, and advised to   stay away from smoking. I have recommended yearly flu vaccine and pneumonia vaccine at least every 5 years; moderate intensity exercise for up to 150 minutes weekly; and  sleep for at least 7 hours a day.  He was found to have superficial cellulitis on his left big toe.  Patient does not have any sensation on his lower extremities.  He will benefit from early intervention.  I discussed and initiated short course low-dose Bactrim 400-800 mg p.o. twice daily for 7 days.  He is advised to seek care at his PCP if he does not see significant improvement or if it is getting worse in 7 days.     - he is  advised to maintain close follow up with Kirstie Peri, MD for primary care needs, as well as his other providers for optimal and coordinated  care.   I spent  25 minutes in the care of the patient today including review of labs from CMP, Lipids, Thyroid Function, Hematology (current and previous including abstractions from other facilities); face-to-face time discussing  his blood glucose readings/logs, discussing hypoglycemia and hyperglycemia episodes and symptoms, medications doses, his options of short and long term treatment based on the latest standards of care / guidelines;  discussion about incorporating lifestyle medicine;  and documenting the encounter. Risk reduction counseling performed per USPSTF guidelines to reduce cardiovascular risk factors.     Please refer to Patient Instructions for Blood Glucose Monitoring and Insulin/Medications Dosing Guide"  in media tab for additional information. Please  also refer to " Patient Self Inventory" in the Media  tab for reviewed elements of pertinent patient history.  Tish Frederickson participated in the discussions, expressed understanding, and voiced agreement with the above plans.  All questions were answered to his satisfaction. he is encouraged to contact clinic should he have any questions or concerns prior to his return visit.    Follow up plan: - Return in about 4 months (around 12/16/2023) for Bring Meter/CGM Device/Logs- A1c in Office.  Marquis Lunch, MD [facility] [address] [place], [place] [address] Phone: [phone]  Fax: [phone]    08/17/2023, 2:39 PM  This note was partially dictated with voice recognition software. Similar sounding words can be transcribed inadequately or may not  be corrected upon review.

## 2023-08-17 NOTE — Patient Instructions (Signed)
                                     Advice for Weight Management  -For most of us the best way to lose weight is by diet management. Generally speaking, diet management means consuming less calories intentionally which over time brings about progressive weight loss.  This can be achieved more effectively by avoiding ultra processed carbohydrates, processed meats, unhealthy fats.    It is critically important to know your numbers: how much calorie you are consuming and how much calorie you need. More importantly, our carbohydrates sources should be unprocessed naturally occurring  complex starch food items.  It is always important to balance nutrition also by  appropriate intake of proteins (mainly plant-based), healthy fats/oils, plenty of fruits and vegetables.   -The American College of Lifestyle Medicine (ACL M) recommends nutrition derived mostly from Whole Food, Plant Predominant Sources example an apple instead of applesauce or apple pie. Eat Plenty of vegetables, Mushrooms, fruits, Legumes, Whole Grains, Nuts, seeds in lieu of processed meats, processed snacks/pastries red meat, poultry, eggs.  Use only water or unsweetened tea for hydration.  The College also recommends the need to stay away from risky substances including alcohol, smoking; obtaining 7-9 hours of restorative sleep, at least 150 minutes of moderate intensity exercise weekly, importance of healthy social connections, and being mindful of stress and seek help when it is overwhelming.    -Sticking to a routine mealtime to eat 3 meals a day and avoiding unnecessary snacks is shown to have a big role in weight control. Under normal circumstances, the only time we burn stored energy is when we are hungry, so allow  some hunger to take place- hunger means no food between appropriate meal times, only water.  It is not advisable to starve.   -It is better to avoid simple carbohydrates including:  Cakes, Sweet Desserts, Ice Cream, Soda (diet and regular), Sweet Tea, Candies, Chips, Cookies, Store Bought Juices, Alcohol in Excess of  1-2 drinks a day, Lemonade,  Artificial Sweeteners, Doughnuts, Coffee Creamers, "Sugar-free" Products, etc, etc.  This is not a complete list.....    -Consulting with certified diabetes educators is proven to provide you with the most accurate and current information on diet.  Also, you may be  interested in discussing diet options/exchanges , we can schedule a visit with Patrick Aguilar, RDN, CDE for individualized nutrition education.  -Exercise: If you are able: 30 -60 minutes a day ,4 days a week, or 150 minutes of moderate intensity exercise weekly.    The longer the better if tolerated.  Combine stretch, strength, and aerobic activities.  If you were told in the past that you have high risk for cardiovascular diseases, or if you are currently symptomatic, you may seek evaluation by your heart doctor prior to initiating moderate to intense exercise programs.                                  Additional Care Considerations for Diabetes/Prediabetes   -Diabetes  is a chronic disease.  The most important care consideration is regular follow-up with your diabetes care provider with the goal being avoiding or delaying its complications and to take advantage of advances in medications and technology.  If appropriate actions are taken early enough, type 2 diabetes can even be   reversed.  Seek information from the right source.  - Whole Food, Plant Predominant Nutrition is highly recommended: Eat Plenty of vegetables, Mushrooms, fruits, Legumes, Whole Grains, Nuts, seeds in lieu of processed meats, processed snacks/pastries red meat, poultry, eggs as recommended by American College of  Lifestyle Medicine (ACLM).  -Type 2 diabetes is known to coexist with other important comorbidities such as high blood pressure and high cholesterol.  It is critical to control not only the  diabetes but also the high blood pressure and high cholesterol to minimize and delay the risk of complications including coronary artery disease, stroke, amputations, blindness, etc.  The good news is that this diet recommendation for type 2 diabetes is also very helpful for managing high cholesterol and high blood blood pressure.  - Studies showed that people with diabetes will benefit from a class of medications known as ACE inhibitors and statins.  Unless there are specific reasons not to be on these medications, the standard of care is to consider getting one from these groups of medications at an optimal doses.  These medications are generally considered safe and proven to help protect the heart and the kidneys.    - People with diabetes are encouraged to initiate and maintain regular follow-up with eye doctors, foot doctors, dentists , and if necessary heart and kidney doctors.     - It is highly recommended that people with diabetes quit smoking or stay away from smoking, and get yearly  flu vaccine and pneumonia vaccine at least every 5 years.  See above for additional recommendations on exercise, sleep, stress management , and healthy social connections.      

## 2023-08-18 DIAGNOSIS — G8220 Paraplegia, unspecified: Secondary | ICD-10-CM | POA: Diagnosis not present

## 2023-08-18 DIAGNOSIS — J449 Chronic obstructive pulmonary disease, unspecified: Secondary | ICD-10-CM | POA: Diagnosis not present

## 2023-08-18 DIAGNOSIS — E1142 Type 2 diabetes mellitus with diabetic polyneuropathy: Secondary | ICD-10-CM | POA: Diagnosis not present

## 2023-08-18 DIAGNOSIS — E11621 Type 2 diabetes mellitus with foot ulcer: Secondary | ICD-10-CM | POA: Diagnosis not present

## 2023-08-18 DIAGNOSIS — L89512 Pressure ulcer of right ankle, stage 2: Secondary | ICD-10-CM | POA: Diagnosis not present

## 2023-08-18 DIAGNOSIS — L97521 Non-pressure chronic ulcer of other part of left foot limited to breakdown of skin: Secondary | ICD-10-CM | POA: Diagnosis not present

## 2023-08-21 DIAGNOSIS — E1142 Type 2 diabetes mellitus with diabetic polyneuropathy: Secondary | ICD-10-CM | POA: Diagnosis not present

## 2023-08-21 DIAGNOSIS — E11621 Type 2 diabetes mellitus with foot ulcer: Secondary | ICD-10-CM | POA: Diagnosis not present

## 2023-08-21 DIAGNOSIS — L89512 Pressure ulcer of right ankle, stage 2: Secondary | ICD-10-CM | POA: Diagnosis not present

## 2023-08-21 DIAGNOSIS — L97521 Non-pressure chronic ulcer of other part of left foot limited to breakdown of skin: Secondary | ICD-10-CM | POA: Diagnosis not present

## 2023-08-21 DIAGNOSIS — J449 Chronic obstructive pulmonary disease, unspecified: Secondary | ICD-10-CM | POA: Diagnosis not present

## 2023-08-21 DIAGNOSIS — G8220 Paraplegia, unspecified: Secondary | ICD-10-CM | POA: Diagnosis not present

## 2023-08-23 DIAGNOSIS — S81801A Unspecified open wound, right lower leg, initial encounter: Secondary | ICD-10-CM | POA: Diagnosis not present

## 2023-08-23 DIAGNOSIS — S91001S Unspecified open wound, right ankle, sequela: Secondary | ICD-10-CM | POA: Diagnosis not present

## 2023-08-23 DIAGNOSIS — S81801D Unspecified open wound, right lower leg, subsequent encounter: Secondary | ICD-10-CM | POA: Diagnosis not present

## 2023-08-23 DIAGNOSIS — S91002A Unspecified open wound, left ankle, initial encounter: Secondary | ICD-10-CM | POA: Diagnosis not present

## 2023-08-23 DIAGNOSIS — S91002S Unspecified open wound, left ankle, sequela: Secondary | ICD-10-CM | POA: Diagnosis not present

## 2023-08-25 DIAGNOSIS — J449 Chronic obstructive pulmonary disease, unspecified: Secondary | ICD-10-CM | POA: Diagnosis not present

## 2023-08-25 DIAGNOSIS — E1142 Type 2 diabetes mellitus with diabetic polyneuropathy: Secondary | ICD-10-CM | POA: Diagnosis not present

## 2023-08-25 DIAGNOSIS — E11621 Type 2 diabetes mellitus with foot ulcer: Secondary | ICD-10-CM | POA: Diagnosis not present

## 2023-08-25 DIAGNOSIS — G8220 Paraplegia, unspecified: Secondary | ICD-10-CM | POA: Diagnosis not present

## 2023-08-25 DIAGNOSIS — L97521 Non-pressure chronic ulcer of other part of left foot limited to breakdown of skin: Secondary | ICD-10-CM | POA: Diagnosis not present

## 2023-08-25 DIAGNOSIS — L89512 Pressure ulcer of right ankle, stage 2: Secondary | ICD-10-CM | POA: Diagnosis not present

## 2023-08-28 DIAGNOSIS — L89512 Pressure ulcer of right ankle, stage 2: Secondary | ICD-10-CM | POA: Diagnosis not present

## 2023-08-28 DIAGNOSIS — E1142 Type 2 diabetes mellitus with diabetic polyneuropathy: Secondary | ICD-10-CM | POA: Diagnosis not present

## 2023-08-28 DIAGNOSIS — E11621 Type 2 diabetes mellitus with foot ulcer: Secondary | ICD-10-CM | POA: Diagnosis not present

## 2023-08-28 DIAGNOSIS — J449 Chronic obstructive pulmonary disease, unspecified: Secondary | ICD-10-CM | POA: Diagnosis not present

## 2023-08-28 DIAGNOSIS — L97521 Non-pressure chronic ulcer of other part of left foot limited to breakdown of skin: Secondary | ICD-10-CM | POA: Diagnosis not present

## 2023-08-28 DIAGNOSIS — G8220 Paraplegia, unspecified: Secondary | ICD-10-CM | POA: Diagnosis not present

## 2023-08-29 DIAGNOSIS — E1169 Type 2 diabetes mellitus with other specified complication: Secondary | ICD-10-CM | POA: Diagnosis not present

## 2023-08-29 DIAGNOSIS — R52 Pain, unspecified: Secondary | ICD-10-CM | POA: Diagnosis not present

## 2023-08-29 DIAGNOSIS — Z299 Encounter for prophylactic measures, unspecified: Secondary | ICD-10-CM | POA: Diagnosis not present

## 2023-08-29 DIAGNOSIS — J449 Chronic obstructive pulmonary disease, unspecified: Secondary | ICD-10-CM | POA: Diagnosis not present

## 2023-08-29 DIAGNOSIS — N644 Mastodynia: Secondary | ICD-10-CM | POA: Diagnosis not present

## 2023-08-29 DIAGNOSIS — J029 Acute pharyngitis, unspecified: Secondary | ICD-10-CM | POA: Diagnosis not present

## 2023-08-29 DIAGNOSIS — I10 Essential (primary) hypertension: Secondary | ICD-10-CM | POA: Diagnosis not present

## 2023-08-30 DIAGNOSIS — S91002S Unspecified open wound, left ankle, sequela: Secondary | ICD-10-CM | POA: Diagnosis not present

## 2023-08-30 DIAGNOSIS — S91002A Unspecified open wound, left ankle, initial encounter: Secondary | ICD-10-CM | POA: Diagnosis not present

## 2023-08-30 DIAGNOSIS — S91001S Unspecified open wound, right ankle, sequela: Secondary | ICD-10-CM | POA: Diagnosis not present

## 2023-08-30 DIAGNOSIS — S81801A Unspecified open wound, right lower leg, initial encounter: Secondary | ICD-10-CM | POA: Diagnosis not present

## 2023-08-30 DIAGNOSIS — S81801D Unspecified open wound, right lower leg, subsequent encounter: Secondary | ICD-10-CM | POA: Diagnosis not present

## 2023-09-01 DIAGNOSIS — J449 Chronic obstructive pulmonary disease, unspecified: Secondary | ICD-10-CM | POA: Diagnosis not present

## 2023-09-01 DIAGNOSIS — E1142 Type 2 diabetes mellitus with diabetic polyneuropathy: Secondary | ICD-10-CM | POA: Diagnosis not present

## 2023-09-01 DIAGNOSIS — L97521 Non-pressure chronic ulcer of other part of left foot limited to breakdown of skin: Secondary | ICD-10-CM | POA: Diagnosis not present

## 2023-09-01 DIAGNOSIS — L89512 Pressure ulcer of right ankle, stage 2: Secondary | ICD-10-CM | POA: Diagnosis not present

## 2023-09-01 DIAGNOSIS — G8220 Paraplegia, unspecified: Secondary | ICD-10-CM | POA: Diagnosis not present

## 2023-09-01 DIAGNOSIS — E11621 Type 2 diabetes mellitus with foot ulcer: Secondary | ICD-10-CM | POA: Diagnosis not present

## 2023-09-04 DIAGNOSIS — L97521 Non-pressure chronic ulcer of other part of left foot limited to breakdown of skin: Secondary | ICD-10-CM | POA: Diagnosis not present

## 2023-09-04 DIAGNOSIS — L89512 Pressure ulcer of right ankle, stage 2: Secondary | ICD-10-CM | POA: Diagnosis not present

## 2023-09-04 DIAGNOSIS — E7800 Pure hypercholesterolemia, unspecified: Secondary | ICD-10-CM | POA: Diagnosis not present

## 2023-09-04 DIAGNOSIS — J449 Chronic obstructive pulmonary disease, unspecified: Secondary | ICD-10-CM | POA: Diagnosis not present

## 2023-09-04 DIAGNOSIS — E11621 Type 2 diabetes mellitus with foot ulcer: Secondary | ICD-10-CM | POA: Diagnosis not present

## 2023-09-04 DIAGNOSIS — L89519 Pressure ulcer of right ankle, unspecified stage: Secondary | ICD-10-CM | POA: Diagnosis not present

## 2023-09-04 DIAGNOSIS — I10 Essential (primary) hypertension: Secondary | ICD-10-CM | POA: Diagnosis not present

## 2023-09-04 DIAGNOSIS — G8220 Paraplegia, unspecified: Secondary | ICD-10-CM | POA: Diagnosis not present

## 2023-09-04 DIAGNOSIS — Z86711 Personal history of pulmonary embolism: Secondary | ICD-10-CM | POA: Diagnosis not present

## 2023-09-04 DIAGNOSIS — Z7982 Long term (current) use of aspirin: Secondary | ICD-10-CM | POA: Diagnosis not present

## 2023-09-04 DIAGNOSIS — L97829 Non-pressure chronic ulcer of other part of left lower leg with unspecified severity: Secondary | ICD-10-CM | POA: Diagnosis not present

## 2023-09-04 DIAGNOSIS — E1142 Type 2 diabetes mellitus with diabetic polyneuropathy: Secondary | ICD-10-CM | POA: Diagnosis not present

## 2023-09-04 DIAGNOSIS — Z79899 Other long term (current) drug therapy: Secondary | ICD-10-CM | POA: Diagnosis not present

## 2023-09-04 DIAGNOSIS — L89529 Pressure ulcer of left ankle, unspecified stage: Secondary | ICD-10-CM | POA: Diagnosis not present

## 2023-09-04 DIAGNOSIS — K2100 Gastro-esophageal reflux disease with esophagitis, without bleeding: Secondary | ICD-10-CM | POA: Diagnosis not present

## 2023-09-04 DIAGNOSIS — Z87891 Personal history of nicotine dependence: Secondary | ICD-10-CM | POA: Diagnosis not present

## 2023-09-04 DIAGNOSIS — L97819 Non-pressure chronic ulcer of other part of right lower leg with unspecified severity: Secondary | ICD-10-CM | POA: Diagnosis not present

## 2023-09-07 DIAGNOSIS — G8220 Paraplegia, unspecified: Secondary | ICD-10-CM | POA: Diagnosis not present

## 2023-09-07 DIAGNOSIS — L89512 Pressure ulcer of right ankle, stage 2: Secondary | ICD-10-CM | POA: Diagnosis not present

## 2023-09-07 DIAGNOSIS — J449 Chronic obstructive pulmonary disease, unspecified: Secondary | ICD-10-CM | POA: Diagnosis not present

## 2023-09-07 DIAGNOSIS — L97521 Non-pressure chronic ulcer of other part of left foot limited to breakdown of skin: Secondary | ICD-10-CM | POA: Diagnosis not present

## 2023-09-07 DIAGNOSIS — E1142 Type 2 diabetes mellitus with diabetic polyneuropathy: Secondary | ICD-10-CM | POA: Diagnosis not present

## 2023-09-07 DIAGNOSIS — E11621 Type 2 diabetes mellitus with foot ulcer: Secondary | ICD-10-CM | POA: Diagnosis not present

## 2023-09-08 DIAGNOSIS — Z7982 Long term (current) use of aspirin: Secondary | ICD-10-CM | POA: Diagnosis not present

## 2023-09-08 DIAGNOSIS — L97819 Non-pressure chronic ulcer of other part of right lower leg with unspecified severity: Secondary | ICD-10-CM | POA: Diagnosis not present

## 2023-09-08 DIAGNOSIS — J449 Chronic obstructive pulmonary disease, unspecified: Secondary | ICD-10-CM | POA: Diagnosis not present

## 2023-09-08 DIAGNOSIS — L97829 Non-pressure chronic ulcer of other part of left lower leg with unspecified severity: Secondary | ICD-10-CM | POA: Diagnosis not present

## 2023-09-08 DIAGNOSIS — E1142 Type 2 diabetes mellitus with diabetic polyneuropathy: Secondary | ICD-10-CM | POA: Diagnosis not present

## 2023-09-08 DIAGNOSIS — I10 Essential (primary) hypertension: Secondary | ICD-10-CM | POA: Diagnosis not present

## 2023-09-08 DIAGNOSIS — Z79899 Other long term (current) drug therapy: Secondary | ICD-10-CM | POA: Diagnosis not present

## 2023-09-08 DIAGNOSIS — L89529 Pressure ulcer of left ankle, unspecified stage: Secondary | ICD-10-CM | POA: Diagnosis not present

## 2023-09-08 DIAGNOSIS — Z86711 Personal history of pulmonary embolism: Secondary | ICD-10-CM | POA: Diagnosis not present

## 2023-09-08 DIAGNOSIS — L89519 Pressure ulcer of right ankle, unspecified stage: Secondary | ICD-10-CM | POA: Diagnosis not present

## 2023-09-08 DIAGNOSIS — E7800 Pure hypercholesterolemia, unspecified: Secondary | ICD-10-CM | POA: Diagnosis not present

## 2023-09-08 DIAGNOSIS — K2100 Gastro-esophageal reflux disease with esophagitis, without bleeding: Secondary | ICD-10-CM | POA: Diagnosis not present

## 2023-09-08 DIAGNOSIS — Z87891 Personal history of nicotine dependence: Secondary | ICD-10-CM | POA: Diagnosis not present

## 2023-09-08 DIAGNOSIS — G8220 Paraplegia, unspecified: Secondary | ICD-10-CM | POA: Diagnosis not present

## 2023-09-11 DIAGNOSIS — L97829 Non-pressure chronic ulcer of other part of left lower leg with unspecified severity: Secondary | ICD-10-CM | POA: Diagnosis not present

## 2023-09-11 DIAGNOSIS — G8220 Paraplegia, unspecified: Secondary | ICD-10-CM | POA: Diagnosis not present

## 2023-09-11 DIAGNOSIS — E7800 Pure hypercholesterolemia, unspecified: Secondary | ICD-10-CM | POA: Diagnosis not present

## 2023-09-11 DIAGNOSIS — I10 Essential (primary) hypertension: Secondary | ICD-10-CM | POA: Diagnosis not present

## 2023-09-11 DIAGNOSIS — Z86711 Personal history of pulmonary embolism: Secondary | ICD-10-CM | POA: Diagnosis not present

## 2023-09-11 DIAGNOSIS — L97819 Non-pressure chronic ulcer of other part of right lower leg with unspecified severity: Secondary | ICD-10-CM | POA: Diagnosis not present

## 2023-09-11 DIAGNOSIS — J449 Chronic obstructive pulmonary disease, unspecified: Secondary | ICD-10-CM | POA: Diagnosis not present

## 2023-09-11 DIAGNOSIS — L89519 Pressure ulcer of right ankle, unspecified stage: Secondary | ICD-10-CM | POA: Diagnosis not present

## 2023-09-11 DIAGNOSIS — L89529 Pressure ulcer of left ankle, unspecified stage: Secondary | ICD-10-CM | POA: Diagnosis not present

## 2023-09-11 DIAGNOSIS — K2100 Gastro-esophageal reflux disease with esophagitis, without bleeding: Secondary | ICD-10-CM | POA: Diagnosis not present

## 2023-09-11 DIAGNOSIS — Z87891 Personal history of nicotine dependence: Secondary | ICD-10-CM | POA: Diagnosis not present

## 2023-09-11 DIAGNOSIS — Z79899 Other long term (current) drug therapy: Secondary | ICD-10-CM | POA: Diagnosis not present

## 2023-09-11 DIAGNOSIS — E1142 Type 2 diabetes mellitus with diabetic polyneuropathy: Secondary | ICD-10-CM | POA: Diagnosis not present

## 2023-09-11 DIAGNOSIS — Z7982 Long term (current) use of aspirin: Secondary | ICD-10-CM | POA: Diagnosis not present

## 2023-09-12 DIAGNOSIS — E1142 Type 2 diabetes mellitus with diabetic polyneuropathy: Secondary | ICD-10-CM | POA: Diagnosis not present

## 2023-09-12 DIAGNOSIS — G8220 Paraplegia, unspecified: Secondary | ICD-10-CM | POA: Diagnosis not present

## 2023-09-12 DIAGNOSIS — L89512 Pressure ulcer of right ankle, stage 2: Secondary | ICD-10-CM | POA: Diagnosis not present

## 2023-09-12 DIAGNOSIS — E11621 Type 2 diabetes mellitus with foot ulcer: Secondary | ICD-10-CM | POA: Diagnosis not present

## 2023-09-12 DIAGNOSIS — J449 Chronic obstructive pulmonary disease, unspecified: Secondary | ICD-10-CM | POA: Diagnosis not present

## 2023-09-12 DIAGNOSIS — L97521 Non-pressure chronic ulcer of other part of left foot limited to breakdown of skin: Secondary | ICD-10-CM | POA: Diagnosis not present

## 2023-09-14 DIAGNOSIS — L89512 Pressure ulcer of right ankle, stage 2: Secondary | ICD-10-CM | POA: Diagnosis not present

## 2023-09-14 DIAGNOSIS — I10 Essential (primary) hypertension: Secondary | ICD-10-CM | POA: Diagnosis not present

## 2023-09-14 DIAGNOSIS — G8220 Paraplegia, unspecified: Secondary | ICD-10-CM | POA: Diagnosis not present

## 2023-09-14 DIAGNOSIS — E1142 Type 2 diabetes mellitus with diabetic polyneuropathy: Secondary | ICD-10-CM | POA: Diagnosis not present

## 2023-09-14 DIAGNOSIS — J449 Chronic obstructive pulmonary disease, unspecified: Secondary | ICD-10-CM | POA: Diagnosis not present

## 2023-09-14 DIAGNOSIS — L89892 Pressure ulcer of other site, stage 2: Secondary | ICD-10-CM | POA: Diagnosis not present

## 2023-09-15 DIAGNOSIS — Z7982 Long term (current) use of aspirin: Secondary | ICD-10-CM | POA: Diagnosis not present

## 2023-09-15 DIAGNOSIS — K2100 Gastro-esophageal reflux disease with esophagitis, without bleeding: Secondary | ICD-10-CM | POA: Diagnosis not present

## 2023-09-15 DIAGNOSIS — E1142 Type 2 diabetes mellitus with diabetic polyneuropathy: Secondary | ICD-10-CM | POA: Diagnosis not present

## 2023-09-15 DIAGNOSIS — L97819 Non-pressure chronic ulcer of other part of right lower leg with unspecified severity: Secondary | ICD-10-CM | POA: Diagnosis not present

## 2023-09-15 DIAGNOSIS — L89529 Pressure ulcer of left ankle, unspecified stage: Secondary | ICD-10-CM | POA: Diagnosis not present

## 2023-09-15 DIAGNOSIS — L97829 Non-pressure chronic ulcer of other part of left lower leg with unspecified severity: Secondary | ICD-10-CM | POA: Diagnosis not present

## 2023-09-15 DIAGNOSIS — G8220 Paraplegia, unspecified: Secondary | ICD-10-CM | POA: Diagnosis not present

## 2023-09-15 DIAGNOSIS — Z79899 Other long term (current) drug therapy: Secondary | ICD-10-CM | POA: Diagnosis not present

## 2023-09-15 DIAGNOSIS — I10 Essential (primary) hypertension: Secondary | ICD-10-CM | POA: Diagnosis not present

## 2023-09-15 DIAGNOSIS — Z86711 Personal history of pulmonary embolism: Secondary | ICD-10-CM | POA: Diagnosis not present

## 2023-09-15 DIAGNOSIS — E7800 Pure hypercholesterolemia, unspecified: Secondary | ICD-10-CM | POA: Diagnosis not present

## 2023-09-15 DIAGNOSIS — J449 Chronic obstructive pulmonary disease, unspecified: Secondary | ICD-10-CM | POA: Diagnosis not present

## 2023-09-15 DIAGNOSIS — Z87891 Personal history of nicotine dependence: Secondary | ICD-10-CM | POA: Diagnosis not present

## 2023-09-15 DIAGNOSIS — L89519 Pressure ulcer of right ankle, unspecified stage: Secondary | ICD-10-CM | POA: Diagnosis not present

## 2023-09-18 DIAGNOSIS — E1142 Type 2 diabetes mellitus with diabetic polyneuropathy: Secondary | ICD-10-CM | POA: Diagnosis not present

## 2023-09-18 DIAGNOSIS — J449 Chronic obstructive pulmonary disease, unspecified: Secondary | ICD-10-CM | POA: Diagnosis not present

## 2023-09-18 DIAGNOSIS — I10 Essential (primary) hypertension: Secondary | ICD-10-CM | POA: Diagnosis not present

## 2023-09-18 DIAGNOSIS — G8220 Paraplegia, unspecified: Secondary | ICD-10-CM | POA: Diagnosis not present

## 2023-09-18 DIAGNOSIS — L89512 Pressure ulcer of right ankle, stage 2: Secondary | ICD-10-CM | POA: Diagnosis not present

## 2023-09-18 DIAGNOSIS — L89892 Pressure ulcer of other site, stage 2: Secondary | ICD-10-CM | POA: Diagnosis not present

## 2023-09-20 DIAGNOSIS — E7800 Pure hypercholesterolemia, unspecified: Secondary | ICD-10-CM | POA: Diagnosis not present

## 2023-09-20 DIAGNOSIS — X58XXXD Exposure to other specified factors, subsequent encounter: Secondary | ICD-10-CM | POA: Diagnosis not present

## 2023-09-20 DIAGNOSIS — L89519 Pressure ulcer of right ankle, unspecified stage: Secondary | ICD-10-CM | POA: Diagnosis not present

## 2023-09-20 DIAGNOSIS — S91102D Unspecified open wound of left great toe without damage to nail, subsequent encounter: Secondary | ICD-10-CM | POA: Diagnosis not present

## 2023-09-20 DIAGNOSIS — Z7982 Long term (current) use of aspirin: Secondary | ICD-10-CM | POA: Diagnosis not present

## 2023-09-20 DIAGNOSIS — L97819 Non-pressure chronic ulcer of other part of right lower leg with unspecified severity: Secondary | ICD-10-CM | POA: Diagnosis not present

## 2023-09-20 DIAGNOSIS — Z86711 Personal history of pulmonary embolism: Secondary | ICD-10-CM | POA: Diagnosis not present

## 2023-09-20 DIAGNOSIS — E1142 Type 2 diabetes mellitus with diabetic polyneuropathy: Secondary | ICD-10-CM | POA: Diagnosis not present

## 2023-09-20 DIAGNOSIS — G8220 Paraplegia, unspecified: Secondary | ICD-10-CM | POA: Diagnosis not present

## 2023-09-20 DIAGNOSIS — I10 Essential (primary) hypertension: Secondary | ICD-10-CM | POA: Diagnosis not present

## 2023-09-20 DIAGNOSIS — Z87891 Personal history of nicotine dependence: Secondary | ICD-10-CM | POA: Diagnosis not present

## 2023-09-20 DIAGNOSIS — S91002D Unspecified open wound, left ankle, subsequent encounter: Secondary | ICD-10-CM | POA: Diagnosis not present

## 2023-09-20 DIAGNOSIS — L97829 Non-pressure chronic ulcer of other part of left lower leg with unspecified severity: Secondary | ICD-10-CM | POA: Diagnosis not present

## 2023-09-20 DIAGNOSIS — J449 Chronic obstructive pulmonary disease, unspecified: Secondary | ICD-10-CM | POA: Diagnosis not present

## 2023-09-20 DIAGNOSIS — K2100 Gastro-esophageal reflux disease with esophagitis, without bleeding: Secondary | ICD-10-CM | POA: Diagnosis not present

## 2023-09-20 DIAGNOSIS — L89529 Pressure ulcer of left ankle, unspecified stage: Secondary | ICD-10-CM | POA: Diagnosis not present

## 2023-09-20 DIAGNOSIS — S91001D Unspecified open wound, right ankle, subsequent encounter: Secondary | ICD-10-CM | POA: Diagnosis not present

## 2023-09-20 DIAGNOSIS — Z79899 Other long term (current) drug therapy: Secondary | ICD-10-CM | POA: Diagnosis not present

## 2023-09-22 DIAGNOSIS — L89512 Pressure ulcer of right ankle, stage 2: Secondary | ICD-10-CM | POA: Diagnosis not present

## 2023-09-22 DIAGNOSIS — L89892 Pressure ulcer of other site, stage 2: Secondary | ICD-10-CM | POA: Diagnosis not present

## 2023-09-22 DIAGNOSIS — G8220 Paraplegia, unspecified: Secondary | ICD-10-CM | POA: Diagnosis not present

## 2023-09-22 DIAGNOSIS — J449 Chronic obstructive pulmonary disease, unspecified: Secondary | ICD-10-CM | POA: Diagnosis not present

## 2023-09-22 DIAGNOSIS — E1142 Type 2 diabetes mellitus with diabetic polyneuropathy: Secondary | ICD-10-CM | POA: Diagnosis not present

## 2023-09-22 DIAGNOSIS — I10 Essential (primary) hypertension: Secondary | ICD-10-CM | POA: Diagnosis not present

## 2023-09-25 DIAGNOSIS — J449 Chronic obstructive pulmonary disease, unspecified: Secondary | ICD-10-CM | POA: Diagnosis not present

## 2023-09-25 DIAGNOSIS — I10 Essential (primary) hypertension: Secondary | ICD-10-CM | POA: Diagnosis not present

## 2023-09-25 DIAGNOSIS — G8220 Paraplegia, unspecified: Secondary | ICD-10-CM | POA: Diagnosis not present

## 2023-09-25 DIAGNOSIS — L89892 Pressure ulcer of other site, stage 2: Secondary | ICD-10-CM | POA: Diagnosis not present

## 2023-09-25 DIAGNOSIS — L89512 Pressure ulcer of right ankle, stage 2: Secondary | ICD-10-CM | POA: Diagnosis not present

## 2023-09-25 DIAGNOSIS — E1142 Type 2 diabetes mellitus with diabetic polyneuropathy: Secondary | ICD-10-CM | POA: Diagnosis not present

## 2023-09-27 DIAGNOSIS — K2100 Gastro-esophageal reflux disease with esophagitis, without bleeding: Secondary | ICD-10-CM | POA: Diagnosis not present

## 2023-09-27 DIAGNOSIS — Z7982 Long term (current) use of aspirin: Secondary | ICD-10-CM | POA: Diagnosis not present

## 2023-09-27 DIAGNOSIS — S81801D Unspecified open wound, right lower leg, subsequent encounter: Secondary | ICD-10-CM | POA: Diagnosis not present

## 2023-09-27 DIAGNOSIS — E1142 Type 2 diabetes mellitus with diabetic polyneuropathy: Secondary | ICD-10-CM | POA: Diagnosis not present

## 2023-09-27 DIAGNOSIS — Z86711 Personal history of pulmonary embolism: Secondary | ICD-10-CM | POA: Diagnosis not present

## 2023-09-27 DIAGNOSIS — G8220 Paraplegia, unspecified: Secondary | ICD-10-CM | POA: Diagnosis not present

## 2023-09-27 DIAGNOSIS — Z87891 Personal history of nicotine dependence: Secondary | ICD-10-CM | POA: Diagnosis not present

## 2023-09-27 DIAGNOSIS — Z79899 Other long term (current) drug therapy: Secondary | ICD-10-CM | POA: Diagnosis not present

## 2023-09-27 DIAGNOSIS — L97819 Non-pressure chronic ulcer of other part of right lower leg with unspecified severity: Secondary | ICD-10-CM | POA: Diagnosis not present

## 2023-09-27 DIAGNOSIS — I10 Essential (primary) hypertension: Secondary | ICD-10-CM | POA: Diagnosis not present

## 2023-09-27 DIAGNOSIS — J449 Chronic obstructive pulmonary disease, unspecified: Secondary | ICD-10-CM | POA: Diagnosis not present

## 2023-09-27 DIAGNOSIS — L97829 Non-pressure chronic ulcer of other part of left lower leg with unspecified severity: Secondary | ICD-10-CM | POA: Diagnosis not present

## 2023-09-27 DIAGNOSIS — L89529 Pressure ulcer of left ankle, unspecified stage: Secondary | ICD-10-CM | POA: Diagnosis not present

## 2023-09-27 DIAGNOSIS — E7800 Pure hypercholesterolemia, unspecified: Secondary | ICD-10-CM | POA: Diagnosis not present

## 2023-09-27 DIAGNOSIS — L89519 Pressure ulcer of right ankle, unspecified stage: Secondary | ICD-10-CM | POA: Diagnosis not present

## 2023-09-27 DIAGNOSIS — S81802D Unspecified open wound, left lower leg, subsequent encounter: Secondary | ICD-10-CM | POA: Diagnosis not present

## 2023-09-28 DIAGNOSIS — G8220 Paraplegia, unspecified: Secondary | ICD-10-CM | POA: Diagnosis not present

## 2023-09-28 DIAGNOSIS — E1142 Type 2 diabetes mellitus with diabetic polyneuropathy: Secondary | ICD-10-CM | POA: Diagnosis not present

## 2023-09-28 DIAGNOSIS — L89892 Pressure ulcer of other site, stage 2: Secondary | ICD-10-CM | POA: Diagnosis not present

## 2023-09-28 DIAGNOSIS — I10 Essential (primary) hypertension: Secondary | ICD-10-CM | POA: Diagnosis not present

## 2023-09-28 DIAGNOSIS — J449 Chronic obstructive pulmonary disease, unspecified: Secondary | ICD-10-CM | POA: Diagnosis not present

## 2023-09-28 DIAGNOSIS — L89512 Pressure ulcer of right ankle, stage 2: Secondary | ICD-10-CM | POA: Diagnosis not present

## 2023-10-02 DIAGNOSIS — J449 Chronic obstructive pulmonary disease, unspecified: Secondary | ICD-10-CM | POA: Diagnosis not present

## 2023-10-02 DIAGNOSIS — L89892 Pressure ulcer of other site, stage 2: Secondary | ICD-10-CM | POA: Diagnosis not present

## 2023-10-02 DIAGNOSIS — G8220 Paraplegia, unspecified: Secondary | ICD-10-CM | POA: Diagnosis not present

## 2023-10-02 DIAGNOSIS — I10 Essential (primary) hypertension: Secondary | ICD-10-CM | POA: Diagnosis not present

## 2023-10-02 DIAGNOSIS — E1142 Type 2 diabetes mellitus with diabetic polyneuropathy: Secondary | ICD-10-CM | POA: Diagnosis not present

## 2023-10-02 DIAGNOSIS — L89512 Pressure ulcer of right ankle, stage 2: Secondary | ICD-10-CM | POA: Diagnosis not present

## 2023-10-04 DIAGNOSIS — I10 Essential (primary) hypertension: Secondary | ICD-10-CM | POA: Diagnosis not present

## 2023-10-04 DIAGNOSIS — Z87891 Personal history of nicotine dependence: Secondary | ICD-10-CM | POA: Diagnosis not present

## 2023-10-04 DIAGNOSIS — E1142 Type 2 diabetes mellitus with diabetic polyneuropathy: Secondary | ICD-10-CM | POA: Diagnosis not present

## 2023-10-04 DIAGNOSIS — Z79899 Other long term (current) drug therapy: Secondary | ICD-10-CM | POA: Diagnosis not present

## 2023-10-04 DIAGNOSIS — E7800 Pure hypercholesterolemia, unspecified: Secondary | ICD-10-CM | POA: Diagnosis not present

## 2023-10-04 DIAGNOSIS — J449 Chronic obstructive pulmonary disease, unspecified: Secondary | ICD-10-CM | POA: Diagnosis not present

## 2023-10-04 DIAGNOSIS — S81802D Unspecified open wound, left lower leg, subsequent encounter: Secondary | ICD-10-CM | POA: Diagnosis not present

## 2023-10-04 DIAGNOSIS — Z7984 Long term (current) use of oral hypoglycemic drugs: Secondary | ICD-10-CM | POA: Diagnosis not present

## 2023-10-05 DIAGNOSIS — Z5181 Encounter for therapeutic drug level monitoring: Secondary | ICD-10-CM | POA: Diagnosis not present

## 2023-10-05 DIAGNOSIS — M4714 Other spondylosis with myelopathy, thoracic region: Secondary | ICD-10-CM | POA: Diagnosis not present

## 2023-10-05 DIAGNOSIS — Z79891 Long term (current) use of opiate analgesic: Secondary | ICD-10-CM | POA: Diagnosis not present

## 2023-10-05 DIAGNOSIS — M48062 Spinal stenosis, lumbar region with neurogenic claudication: Secondary | ICD-10-CM | POA: Diagnosis not present

## 2023-10-05 DIAGNOSIS — G894 Chronic pain syndrome: Secondary | ICD-10-CM | POA: Diagnosis not present

## 2023-10-06 DIAGNOSIS — I10 Essential (primary) hypertension: Secondary | ICD-10-CM | POA: Diagnosis not present

## 2023-10-06 DIAGNOSIS — J449 Chronic obstructive pulmonary disease, unspecified: Secondary | ICD-10-CM | POA: Diagnosis not present

## 2023-10-06 DIAGNOSIS — L89893 Pressure ulcer of other site, stage 3: Secondary | ICD-10-CM | POA: Diagnosis not present

## 2023-10-06 DIAGNOSIS — L89892 Pressure ulcer of other site, stage 2: Secondary | ICD-10-CM | POA: Diagnosis not present

## 2023-10-06 DIAGNOSIS — L89512 Pressure ulcer of right ankle, stage 2: Secondary | ICD-10-CM | POA: Diagnosis not present

## 2023-10-06 DIAGNOSIS — E1142 Type 2 diabetes mellitus with diabetic polyneuropathy: Secondary | ICD-10-CM | POA: Diagnosis not present

## 2023-10-06 DIAGNOSIS — G8220 Paraplegia, unspecified: Secondary | ICD-10-CM | POA: Diagnosis not present

## 2023-10-09 DIAGNOSIS — L89512 Pressure ulcer of right ankle, stage 2: Secondary | ICD-10-CM | POA: Diagnosis not present

## 2023-10-09 DIAGNOSIS — G8220 Paraplegia, unspecified: Secondary | ICD-10-CM | POA: Diagnosis not present

## 2023-10-09 DIAGNOSIS — J449 Chronic obstructive pulmonary disease, unspecified: Secondary | ICD-10-CM | POA: Diagnosis not present

## 2023-10-09 DIAGNOSIS — E1142 Type 2 diabetes mellitus with diabetic polyneuropathy: Secondary | ICD-10-CM | POA: Diagnosis not present

## 2023-10-09 DIAGNOSIS — I10 Essential (primary) hypertension: Secondary | ICD-10-CM | POA: Diagnosis not present

## 2023-10-09 DIAGNOSIS — L89892 Pressure ulcer of other site, stage 2: Secondary | ICD-10-CM | POA: Diagnosis not present

## 2023-10-11 DIAGNOSIS — S81802A Unspecified open wound, left lower leg, initial encounter: Secondary | ICD-10-CM | POA: Diagnosis not present

## 2023-10-11 DIAGNOSIS — Z87891 Personal history of nicotine dependence: Secondary | ICD-10-CM | POA: Diagnosis not present

## 2023-10-11 DIAGNOSIS — Z7984 Long term (current) use of oral hypoglycemic drugs: Secondary | ICD-10-CM | POA: Diagnosis not present

## 2023-10-11 DIAGNOSIS — E7800 Pure hypercholesterolemia, unspecified: Secondary | ICD-10-CM | POA: Diagnosis not present

## 2023-10-11 DIAGNOSIS — S81802D Unspecified open wound, left lower leg, subsequent encounter: Secondary | ICD-10-CM | POA: Diagnosis not present

## 2023-10-11 DIAGNOSIS — S91002A Unspecified open wound, left ankle, initial encounter: Secondary | ICD-10-CM | POA: Diagnosis not present

## 2023-10-11 DIAGNOSIS — Z79899 Other long term (current) drug therapy: Secondary | ICD-10-CM | POA: Diagnosis not present

## 2023-10-11 DIAGNOSIS — S91001A Unspecified open wound, right ankle, initial encounter: Secondary | ICD-10-CM | POA: Diagnosis not present

## 2023-10-11 DIAGNOSIS — S81801A Unspecified open wound, right lower leg, initial encounter: Secondary | ICD-10-CM | POA: Diagnosis not present

## 2023-10-11 DIAGNOSIS — J449 Chronic obstructive pulmonary disease, unspecified: Secondary | ICD-10-CM | POA: Diagnosis not present

## 2023-10-11 DIAGNOSIS — I10 Essential (primary) hypertension: Secondary | ICD-10-CM | POA: Diagnosis not present

## 2023-10-11 DIAGNOSIS — E1142 Type 2 diabetes mellitus with diabetic polyneuropathy: Secondary | ICD-10-CM | POA: Diagnosis not present

## 2023-10-12 DIAGNOSIS — G8220 Paraplegia, unspecified: Secondary | ICD-10-CM | POA: Diagnosis not present

## 2023-10-12 DIAGNOSIS — I10 Essential (primary) hypertension: Secondary | ICD-10-CM | POA: Diagnosis not present

## 2023-10-12 DIAGNOSIS — L89892 Pressure ulcer of other site, stage 2: Secondary | ICD-10-CM | POA: Diagnosis not present

## 2023-10-12 DIAGNOSIS — E1142 Type 2 diabetes mellitus with diabetic polyneuropathy: Secondary | ICD-10-CM | POA: Diagnosis not present

## 2023-10-12 DIAGNOSIS — L89512 Pressure ulcer of right ankle, stage 2: Secondary | ICD-10-CM | POA: Diagnosis not present

## 2023-10-12 DIAGNOSIS — J449 Chronic obstructive pulmonary disease, unspecified: Secondary | ICD-10-CM | POA: Diagnosis not present

## 2023-10-16 DIAGNOSIS — I10 Essential (primary) hypertension: Secondary | ICD-10-CM | POA: Diagnosis not present

## 2023-10-16 DIAGNOSIS — L89512 Pressure ulcer of right ankle, stage 2: Secondary | ICD-10-CM | POA: Diagnosis not present

## 2023-10-16 DIAGNOSIS — G8220 Paraplegia, unspecified: Secondary | ICD-10-CM | POA: Diagnosis not present

## 2023-10-16 DIAGNOSIS — J449 Chronic obstructive pulmonary disease, unspecified: Secondary | ICD-10-CM | POA: Diagnosis not present

## 2023-10-16 DIAGNOSIS — E1142 Type 2 diabetes mellitus with diabetic polyneuropathy: Secondary | ICD-10-CM | POA: Diagnosis not present

## 2023-10-16 DIAGNOSIS — L89892 Pressure ulcer of other site, stage 2: Secondary | ICD-10-CM | POA: Diagnosis not present

## 2023-10-18 DIAGNOSIS — E7800 Pure hypercholesterolemia, unspecified: Secondary | ICD-10-CM | POA: Diagnosis not present

## 2023-10-18 DIAGNOSIS — Z87891 Personal history of nicotine dependence: Secondary | ICD-10-CM | POA: Diagnosis not present

## 2023-10-18 DIAGNOSIS — J449 Chronic obstructive pulmonary disease, unspecified: Secondary | ICD-10-CM | POA: Diagnosis not present

## 2023-10-18 DIAGNOSIS — S81802D Unspecified open wound, left lower leg, subsequent encounter: Secondary | ICD-10-CM | POA: Diagnosis not present

## 2023-10-18 DIAGNOSIS — Z79899 Other long term (current) drug therapy: Secondary | ICD-10-CM | POA: Diagnosis not present

## 2023-10-18 DIAGNOSIS — Z7984 Long term (current) use of oral hypoglycemic drugs: Secondary | ICD-10-CM | POA: Diagnosis not present

## 2023-10-18 DIAGNOSIS — S81802A Unspecified open wound, left lower leg, initial encounter: Secondary | ICD-10-CM | POA: Diagnosis not present

## 2023-10-18 DIAGNOSIS — I10 Essential (primary) hypertension: Secondary | ICD-10-CM | POA: Diagnosis not present

## 2023-10-18 DIAGNOSIS — E1142 Type 2 diabetes mellitus with diabetic polyneuropathy: Secondary | ICD-10-CM | POA: Diagnosis not present

## 2023-10-20 DIAGNOSIS — G8220 Paraplegia, unspecified: Secondary | ICD-10-CM | POA: Diagnosis not present

## 2023-10-20 DIAGNOSIS — I10 Essential (primary) hypertension: Secondary | ICD-10-CM | POA: Diagnosis not present

## 2023-10-20 DIAGNOSIS — J449 Chronic obstructive pulmonary disease, unspecified: Secondary | ICD-10-CM | POA: Diagnosis not present

## 2023-10-20 DIAGNOSIS — E1142 Type 2 diabetes mellitus with diabetic polyneuropathy: Secondary | ICD-10-CM | POA: Diagnosis not present

## 2023-10-20 DIAGNOSIS — L89512 Pressure ulcer of right ankle, stage 2: Secondary | ICD-10-CM | POA: Diagnosis not present

## 2023-10-20 DIAGNOSIS — L89892 Pressure ulcer of other site, stage 2: Secondary | ICD-10-CM | POA: Diagnosis not present

## 2023-10-23 DIAGNOSIS — J449 Chronic obstructive pulmonary disease, unspecified: Secondary | ICD-10-CM | POA: Diagnosis not present

## 2023-10-23 DIAGNOSIS — E1142 Type 2 diabetes mellitus with diabetic polyneuropathy: Secondary | ICD-10-CM | POA: Diagnosis not present

## 2023-10-23 DIAGNOSIS — L89512 Pressure ulcer of right ankle, stage 2: Secondary | ICD-10-CM | POA: Diagnosis not present

## 2023-10-23 DIAGNOSIS — G8220 Paraplegia, unspecified: Secondary | ICD-10-CM | POA: Diagnosis not present

## 2023-10-23 DIAGNOSIS — L89892 Pressure ulcer of other site, stage 2: Secondary | ICD-10-CM | POA: Diagnosis not present

## 2023-10-23 DIAGNOSIS — I10 Essential (primary) hypertension: Secondary | ICD-10-CM | POA: Diagnosis not present

## 2023-10-27 DIAGNOSIS — L89512 Pressure ulcer of right ankle, stage 2: Secondary | ICD-10-CM | POA: Diagnosis not present

## 2023-10-27 DIAGNOSIS — E1142 Type 2 diabetes mellitus with diabetic polyneuropathy: Secondary | ICD-10-CM | POA: Diagnosis not present

## 2023-10-27 DIAGNOSIS — J449 Chronic obstructive pulmonary disease, unspecified: Secondary | ICD-10-CM | POA: Diagnosis not present

## 2023-10-27 DIAGNOSIS — I10 Essential (primary) hypertension: Secondary | ICD-10-CM | POA: Diagnosis not present

## 2023-10-27 DIAGNOSIS — G8220 Paraplegia, unspecified: Secondary | ICD-10-CM | POA: Diagnosis not present

## 2023-10-27 DIAGNOSIS — L89892 Pressure ulcer of other site, stage 2: Secondary | ICD-10-CM | POA: Diagnosis not present

## 2023-10-30 DIAGNOSIS — G8220 Paraplegia, unspecified: Secondary | ICD-10-CM | POA: Diagnosis not present

## 2023-10-30 DIAGNOSIS — E1142 Type 2 diabetes mellitus with diabetic polyneuropathy: Secondary | ICD-10-CM | POA: Diagnosis not present

## 2023-10-30 DIAGNOSIS — J449 Chronic obstructive pulmonary disease, unspecified: Secondary | ICD-10-CM | POA: Diagnosis not present

## 2023-10-30 DIAGNOSIS — I10 Essential (primary) hypertension: Secondary | ICD-10-CM | POA: Diagnosis not present

## 2023-10-30 DIAGNOSIS — L89512 Pressure ulcer of right ankle, stage 2: Secondary | ICD-10-CM | POA: Diagnosis not present

## 2023-10-30 DIAGNOSIS — L89892 Pressure ulcer of other site, stage 2: Secondary | ICD-10-CM | POA: Diagnosis not present

## 2023-11-01 DIAGNOSIS — K2100 Gastro-esophageal reflux disease with esophagitis, without bleeding: Secondary | ICD-10-CM | POA: Diagnosis not present

## 2023-11-01 DIAGNOSIS — Z87891 Personal history of nicotine dependence: Secondary | ICD-10-CM | POA: Diagnosis not present

## 2023-11-01 DIAGNOSIS — E7800 Pure hypercholesterolemia, unspecified: Secondary | ICD-10-CM | POA: Diagnosis not present

## 2023-11-01 DIAGNOSIS — S81801S Unspecified open wound, right lower leg, sequela: Secondary | ICD-10-CM | POA: Diagnosis not present

## 2023-11-01 DIAGNOSIS — I10 Essential (primary) hypertension: Secondary | ICD-10-CM | POA: Diagnosis not present

## 2023-11-01 DIAGNOSIS — S81801D Unspecified open wound, right lower leg, subsequent encounter: Secondary | ICD-10-CM | POA: Diagnosis not present

## 2023-11-01 DIAGNOSIS — E1142 Type 2 diabetes mellitus with diabetic polyneuropathy: Secondary | ICD-10-CM | POA: Diagnosis not present

## 2023-11-01 DIAGNOSIS — Z885 Allergy status to narcotic agent status: Secondary | ICD-10-CM | POA: Diagnosis not present

## 2023-11-01 DIAGNOSIS — S91102D Unspecified open wound of left great toe without damage to nail, subsequent encounter: Secondary | ICD-10-CM | POA: Diagnosis not present

## 2023-11-01 DIAGNOSIS — S91102S Unspecified open wound of left great toe without damage to nail, sequela: Secondary | ICD-10-CM | POA: Diagnosis not present

## 2023-11-01 DIAGNOSIS — J449 Chronic obstructive pulmonary disease, unspecified: Secondary | ICD-10-CM | POA: Diagnosis not present

## 2023-11-01 DIAGNOSIS — S91001D Unspecified open wound, right ankle, subsequent encounter: Secondary | ICD-10-CM | POA: Diagnosis not present

## 2023-11-01 DIAGNOSIS — G8220 Paraplegia, unspecified: Secondary | ICD-10-CM | POA: Diagnosis not present

## 2023-11-01 DIAGNOSIS — S91002D Unspecified open wound, left ankle, subsequent encounter: Secondary | ICD-10-CM | POA: Diagnosis not present

## 2023-11-01 DIAGNOSIS — S91002S Unspecified open wound, left ankle, sequela: Secondary | ICD-10-CM | POA: Diagnosis not present

## 2023-11-03 DIAGNOSIS — E1142 Type 2 diabetes mellitus with diabetic polyneuropathy: Secondary | ICD-10-CM | POA: Diagnosis not present

## 2023-11-03 DIAGNOSIS — J449 Chronic obstructive pulmonary disease, unspecified: Secondary | ICD-10-CM | POA: Diagnosis not present

## 2023-11-03 DIAGNOSIS — L89512 Pressure ulcer of right ankle, stage 2: Secondary | ICD-10-CM | POA: Diagnosis not present

## 2023-11-03 DIAGNOSIS — L89892 Pressure ulcer of other site, stage 2: Secondary | ICD-10-CM | POA: Diagnosis not present

## 2023-11-03 DIAGNOSIS — G8220 Paraplegia, unspecified: Secondary | ICD-10-CM | POA: Diagnosis not present

## 2023-11-03 DIAGNOSIS — I10 Essential (primary) hypertension: Secondary | ICD-10-CM | POA: Diagnosis not present

## 2023-11-06 DIAGNOSIS — J449 Chronic obstructive pulmonary disease, unspecified: Secondary | ICD-10-CM | POA: Diagnosis not present

## 2023-11-06 DIAGNOSIS — G8220 Paraplegia, unspecified: Secondary | ICD-10-CM | POA: Diagnosis not present

## 2023-11-06 DIAGNOSIS — I10 Essential (primary) hypertension: Secondary | ICD-10-CM | POA: Diagnosis not present

## 2023-11-06 DIAGNOSIS — E1142 Type 2 diabetes mellitus with diabetic polyneuropathy: Secondary | ICD-10-CM | POA: Diagnosis not present

## 2023-11-06 DIAGNOSIS — L89892 Pressure ulcer of other site, stage 2: Secondary | ICD-10-CM | POA: Diagnosis not present

## 2023-11-06 DIAGNOSIS — L89512 Pressure ulcer of right ankle, stage 2: Secondary | ICD-10-CM | POA: Diagnosis not present

## 2023-11-08 DIAGNOSIS — I10 Essential (primary) hypertension: Secondary | ICD-10-CM | POA: Diagnosis not present

## 2023-11-08 DIAGNOSIS — E7800 Pure hypercholesterolemia, unspecified: Secondary | ICD-10-CM | POA: Diagnosis not present

## 2023-11-08 DIAGNOSIS — S91102S Unspecified open wound of left great toe without damage to nail, sequela: Secondary | ICD-10-CM | POA: Diagnosis not present

## 2023-11-08 DIAGNOSIS — Z885 Allergy status to narcotic agent status: Secondary | ICD-10-CM | POA: Diagnosis not present

## 2023-11-08 DIAGNOSIS — K2100 Gastro-esophageal reflux disease with esophagitis, without bleeding: Secondary | ICD-10-CM | POA: Diagnosis not present

## 2023-11-08 DIAGNOSIS — S91002S Unspecified open wound, left ankle, sequela: Secondary | ICD-10-CM | POA: Diagnosis not present

## 2023-11-08 DIAGNOSIS — X58XXXD Exposure to other specified factors, subsequent encounter: Secondary | ICD-10-CM | POA: Diagnosis not present

## 2023-11-08 DIAGNOSIS — J449 Chronic obstructive pulmonary disease, unspecified: Secondary | ICD-10-CM | POA: Diagnosis not present

## 2023-11-08 DIAGNOSIS — S81801D Unspecified open wound, right lower leg, subsequent encounter: Secondary | ICD-10-CM | POA: Diagnosis not present

## 2023-11-08 DIAGNOSIS — S81802D Unspecified open wound, left lower leg, subsequent encounter: Secondary | ICD-10-CM | POA: Diagnosis not present

## 2023-11-08 DIAGNOSIS — E1142 Type 2 diabetes mellitus with diabetic polyneuropathy: Secondary | ICD-10-CM | POA: Diagnosis not present

## 2023-11-08 DIAGNOSIS — Z87891 Personal history of nicotine dependence: Secondary | ICD-10-CM | POA: Diagnosis not present

## 2023-11-08 DIAGNOSIS — S81801S Unspecified open wound, right lower leg, sequela: Secondary | ICD-10-CM | POA: Diagnosis not present

## 2023-11-08 DIAGNOSIS — G8220 Paraplegia, unspecified: Secondary | ICD-10-CM | POA: Diagnosis not present

## 2023-11-10 DIAGNOSIS — E1142 Type 2 diabetes mellitus with diabetic polyneuropathy: Secondary | ICD-10-CM | POA: Diagnosis not present

## 2023-11-10 DIAGNOSIS — G8220 Paraplegia, unspecified: Secondary | ICD-10-CM | POA: Diagnosis not present

## 2023-11-10 DIAGNOSIS — I10 Essential (primary) hypertension: Secondary | ICD-10-CM | POA: Diagnosis not present

## 2023-11-10 DIAGNOSIS — L89512 Pressure ulcer of right ankle, stage 2: Secondary | ICD-10-CM | POA: Diagnosis not present

## 2023-11-10 DIAGNOSIS — L89892 Pressure ulcer of other site, stage 2: Secondary | ICD-10-CM | POA: Diagnosis not present

## 2023-11-10 DIAGNOSIS — J449 Chronic obstructive pulmonary disease, unspecified: Secondary | ICD-10-CM | POA: Diagnosis not present

## 2023-11-12 DIAGNOSIS — I10 Essential (primary) hypertension: Secondary | ICD-10-CM | POA: Diagnosis not present

## 2023-11-12 DIAGNOSIS — J449 Chronic obstructive pulmonary disease, unspecified: Secondary | ICD-10-CM | POA: Diagnosis not present

## 2023-11-12 DIAGNOSIS — L89512 Pressure ulcer of right ankle, stage 2: Secondary | ICD-10-CM | POA: Diagnosis not present

## 2023-11-12 DIAGNOSIS — G8220 Paraplegia, unspecified: Secondary | ICD-10-CM | POA: Diagnosis not present

## 2023-11-12 DIAGNOSIS — L89892 Pressure ulcer of other site, stage 2: Secondary | ICD-10-CM | POA: Diagnosis not present

## 2023-11-12 DIAGNOSIS — E1142 Type 2 diabetes mellitus with diabetic polyneuropathy: Secondary | ICD-10-CM | POA: Diagnosis not present

## 2023-11-15 DIAGNOSIS — K2100 Gastro-esophageal reflux disease with esophagitis, without bleeding: Secondary | ICD-10-CM | POA: Diagnosis not present

## 2023-11-15 DIAGNOSIS — S91002S Unspecified open wound, left ankle, sequela: Secondary | ICD-10-CM | POA: Diagnosis not present

## 2023-11-15 DIAGNOSIS — E7800 Pure hypercholesterolemia, unspecified: Secondary | ICD-10-CM | POA: Diagnosis not present

## 2023-11-15 DIAGNOSIS — Z87891 Personal history of nicotine dependence: Secondary | ICD-10-CM | POA: Diagnosis not present

## 2023-11-15 DIAGNOSIS — E1142 Type 2 diabetes mellitus with diabetic polyneuropathy: Secondary | ICD-10-CM | POA: Diagnosis not present

## 2023-11-15 DIAGNOSIS — G8220 Paraplegia, unspecified: Secondary | ICD-10-CM | POA: Diagnosis not present

## 2023-11-15 DIAGNOSIS — I10 Essential (primary) hypertension: Secondary | ICD-10-CM | POA: Diagnosis not present

## 2023-11-15 DIAGNOSIS — Z885 Allergy status to narcotic agent status: Secondary | ICD-10-CM | POA: Diagnosis not present

## 2023-11-15 DIAGNOSIS — S81801S Unspecified open wound, right lower leg, sequela: Secondary | ICD-10-CM | POA: Diagnosis not present

## 2023-11-15 DIAGNOSIS — S91102S Unspecified open wound of left great toe without damage to nail, sequela: Secondary | ICD-10-CM | POA: Diagnosis not present

## 2023-11-15 DIAGNOSIS — J449 Chronic obstructive pulmonary disease, unspecified: Secondary | ICD-10-CM | POA: Diagnosis not present

## 2023-11-17 DIAGNOSIS — I10 Essential (primary) hypertension: Secondary | ICD-10-CM | POA: Diagnosis not present

## 2023-11-17 DIAGNOSIS — J449 Chronic obstructive pulmonary disease, unspecified: Secondary | ICD-10-CM | POA: Diagnosis not present

## 2023-11-17 DIAGNOSIS — L89892 Pressure ulcer of other site, stage 2: Secondary | ICD-10-CM | POA: Diagnosis not present

## 2023-11-17 DIAGNOSIS — E1142 Type 2 diabetes mellitus with diabetic polyneuropathy: Secondary | ICD-10-CM | POA: Diagnosis not present

## 2023-11-17 DIAGNOSIS — G8220 Paraplegia, unspecified: Secondary | ICD-10-CM | POA: Diagnosis not present

## 2023-11-17 DIAGNOSIS — L89512 Pressure ulcer of right ankle, stage 2: Secondary | ICD-10-CM | POA: Diagnosis not present

## 2023-11-20 DIAGNOSIS — L89512 Pressure ulcer of right ankle, stage 2: Secondary | ICD-10-CM | POA: Diagnosis not present

## 2023-11-20 DIAGNOSIS — E1142 Type 2 diabetes mellitus with diabetic polyneuropathy: Secondary | ICD-10-CM | POA: Diagnosis not present

## 2023-11-20 DIAGNOSIS — L89892 Pressure ulcer of other site, stage 2: Secondary | ICD-10-CM | POA: Diagnosis not present

## 2023-11-20 DIAGNOSIS — G8220 Paraplegia, unspecified: Secondary | ICD-10-CM | POA: Diagnosis not present

## 2023-11-20 DIAGNOSIS — J449 Chronic obstructive pulmonary disease, unspecified: Secondary | ICD-10-CM | POA: Diagnosis not present

## 2023-11-20 DIAGNOSIS — I10 Essential (primary) hypertension: Secondary | ICD-10-CM | POA: Diagnosis not present

## 2023-11-22 DIAGNOSIS — G8220 Paraplegia, unspecified: Secondary | ICD-10-CM | POA: Diagnosis not present

## 2023-11-22 DIAGNOSIS — S91002S Unspecified open wound, left ankle, sequela: Secondary | ICD-10-CM | POA: Diagnosis not present

## 2023-11-22 DIAGNOSIS — S91102S Unspecified open wound of left great toe without damage to nail, sequela: Secondary | ICD-10-CM | POA: Diagnosis not present

## 2023-11-22 DIAGNOSIS — I10 Essential (primary) hypertension: Secondary | ICD-10-CM | POA: Diagnosis not present

## 2023-11-22 DIAGNOSIS — Z87891 Personal history of nicotine dependence: Secondary | ICD-10-CM | POA: Diagnosis not present

## 2023-11-22 DIAGNOSIS — S81801A Unspecified open wound, right lower leg, initial encounter: Secondary | ICD-10-CM | POA: Diagnosis not present

## 2023-11-22 DIAGNOSIS — S81801S Unspecified open wound, right lower leg, sequela: Secondary | ICD-10-CM | POA: Diagnosis not present

## 2023-11-22 DIAGNOSIS — E1142 Type 2 diabetes mellitus with diabetic polyneuropathy: Secondary | ICD-10-CM | POA: Diagnosis not present

## 2023-11-22 DIAGNOSIS — E7800 Pure hypercholesterolemia, unspecified: Secondary | ICD-10-CM | POA: Diagnosis not present

## 2023-11-22 DIAGNOSIS — Z885 Allergy status to narcotic agent status: Secondary | ICD-10-CM | POA: Diagnosis not present

## 2023-11-22 DIAGNOSIS — S81802A Unspecified open wound, left lower leg, initial encounter: Secondary | ICD-10-CM | POA: Diagnosis not present

## 2023-11-22 DIAGNOSIS — K2100 Gastro-esophageal reflux disease with esophagitis, without bleeding: Secondary | ICD-10-CM | POA: Diagnosis not present

## 2023-11-22 DIAGNOSIS — J449 Chronic obstructive pulmonary disease, unspecified: Secondary | ICD-10-CM | POA: Diagnosis not present

## 2023-11-22 DIAGNOSIS — L89509 Pressure ulcer of unspecified ankle, unspecified stage: Secondary | ICD-10-CM | POA: Diagnosis not present

## 2023-11-22 DIAGNOSIS — S91102A Unspecified open wound of left great toe without damage to nail, initial encounter: Secondary | ICD-10-CM | POA: Diagnosis not present

## 2023-11-25 DIAGNOSIS — E1142 Type 2 diabetes mellitus with diabetic polyneuropathy: Secondary | ICD-10-CM | POA: Diagnosis not present

## 2023-11-25 DIAGNOSIS — G8220 Paraplegia, unspecified: Secondary | ICD-10-CM | POA: Diagnosis not present

## 2023-11-25 DIAGNOSIS — L89512 Pressure ulcer of right ankle, stage 2: Secondary | ICD-10-CM | POA: Diagnosis not present

## 2023-11-25 DIAGNOSIS — I10 Essential (primary) hypertension: Secondary | ICD-10-CM | POA: Diagnosis not present

## 2023-11-25 DIAGNOSIS — J449 Chronic obstructive pulmonary disease, unspecified: Secondary | ICD-10-CM | POA: Diagnosis not present

## 2023-11-25 DIAGNOSIS — L89892 Pressure ulcer of other site, stage 2: Secondary | ICD-10-CM | POA: Diagnosis not present

## 2023-11-28 DIAGNOSIS — G8220 Paraplegia, unspecified: Secondary | ICD-10-CM | POA: Diagnosis not present

## 2023-11-28 DIAGNOSIS — J449 Chronic obstructive pulmonary disease, unspecified: Secondary | ICD-10-CM | POA: Diagnosis not present

## 2023-11-28 DIAGNOSIS — L89892 Pressure ulcer of other site, stage 2: Secondary | ICD-10-CM | POA: Diagnosis not present

## 2023-11-28 DIAGNOSIS — I10 Essential (primary) hypertension: Secondary | ICD-10-CM | POA: Diagnosis not present

## 2023-11-28 DIAGNOSIS — E1142 Type 2 diabetes mellitus with diabetic polyneuropathy: Secondary | ICD-10-CM | POA: Diagnosis not present

## 2023-11-28 DIAGNOSIS — L89512 Pressure ulcer of right ankle, stage 2: Secondary | ICD-10-CM | POA: Diagnosis not present

## 2023-11-29 DIAGNOSIS — S91002S Unspecified open wound, left ankle, sequela: Secondary | ICD-10-CM | POA: Diagnosis not present

## 2023-11-29 DIAGNOSIS — Z87891 Personal history of nicotine dependence: Secondary | ICD-10-CM | POA: Diagnosis not present

## 2023-11-29 DIAGNOSIS — J449 Chronic obstructive pulmonary disease, unspecified: Secondary | ICD-10-CM | POA: Diagnosis not present

## 2023-11-29 DIAGNOSIS — S91102S Unspecified open wound of left great toe without damage to nail, sequela: Secondary | ICD-10-CM | POA: Diagnosis not present

## 2023-11-29 DIAGNOSIS — S81801S Unspecified open wound, right lower leg, sequela: Secondary | ICD-10-CM | POA: Diagnosis not present

## 2023-11-29 DIAGNOSIS — E7800 Pure hypercholesterolemia, unspecified: Secondary | ICD-10-CM | POA: Diagnosis not present

## 2023-11-29 DIAGNOSIS — G8220 Paraplegia, unspecified: Secondary | ICD-10-CM | POA: Diagnosis not present

## 2023-11-29 DIAGNOSIS — Z885 Allergy status to narcotic agent status: Secondary | ICD-10-CM | POA: Diagnosis not present

## 2023-11-29 DIAGNOSIS — E1142 Type 2 diabetes mellitus with diabetic polyneuropathy: Secondary | ICD-10-CM | POA: Diagnosis not present

## 2023-11-29 DIAGNOSIS — S81802D Unspecified open wound, left lower leg, subsequent encounter: Secondary | ICD-10-CM | POA: Diagnosis not present

## 2023-11-29 DIAGNOSIS — K2100 Gastro-esophageal reflux disease with esophagitis, without bleeding: Secondary | ICD-10-CM | POA: Diagnosis not present

## 2023-11-29 DIAGNOSIS — I10 Essential (primary) hypertension: Secondary | ICD-10-CM | POA: Diagnosis not present

## 2023-11-29 DIAGNOSIS — X58XXXD Exposure to other specified factors, subsequent encounter: Secondary | ICD-10-CM | POA: Diagnosis not present

## 2023-12-01 DIAGNOSIS — J449 Chronic obstructive pulmonary disease, unspecified: Secondary | ICD-10-CM | POA: Diagnosis not present

## 2023-12-01 DIAGNOSIS — L89512 Pressure ulcer of right ankle, stage 2: Secondary | ICD-10-CM | POA: Diagnosis not present

## 2023-12-01 DIAGNOSIS — E1142 Type 2 diabetes mellitus with diabetic polyneuropathy: Secondary | ICD-10-CM | POA: Diagnosis not present

## 2023-12-01 DIAGNOSIS — I10 Essential (primary) hypertension: Secondary | ICD-10-CM | POA: Diagnosis not present

## 2023-12-01 DIAGNOSIS — L89892 Pressure ulcer of other site, stage 2: Secondary | ICD-10-CM | POA: Diagnosis not present

## 2023-12-01 DIAGNOSIS — G8220 Paraplegia, unspecified: Secondary | ICD-10-CM | POA: Diagnosis not present

## 2023-12-04 DIAGNOSIS — L89512 Pressure ulcer of right ankle, stage 2: Secondary | ICD-10-CM | POA: Diagnosis not present

## 2023-12-04 DIAGNOSIS — E1142 Type 2 diabetes mellitus with diabetic polyneuropathy: Secondary | ICD-10-CM | POA: Diagnosis not present

## 2023-12-04 DIAGNOSIS — G8220 Paraplegia, unspecified: Secondary | ICD-10-CM | POA: Diagnosis not present

## 2023-12-04 DIAGNOSIS — L89892 Pressure ulcer of other site, stage 2: Secondary | ICD-10-CM | POA: Diagnosis not present

## 2023-12-04 DIAGNOSIS — J449 Chronic obstructive pulmonary disease, unspecified: Secondary | ICD-10-CM | POA: Diagnosis not present

## 2023-12-04 DIAGNOSIS — I10 Essential (primary) hypertension: Secondary | ICD-10-CM | POA: Diagnosis not present

## 2023-12-06 DIAGNOSIS — S81801S Unspecified open wound, right lower leg, sequela: Secondary | ICD-10-CM | POA: Diagnosis not present

## 2023-12-06 DIAGNOSIS — S91105D Unspecified open wound of left lesser toe(s) without damage to nail, subsequent encounter: Secondary | ICD-10-CM | POA: Diagnosis not present

## 2023-12-06 DIAGNOSIS — S81801A Unspecified open wound, right lower leg, initial encounter: Secondary | ICD-10-CM | POA: Diagnosis not present

## 2023-12-06 DIAGNOSIS — S81802D Unspecified open wound, left lower leg, subsequent encounter: Secondary | ICD-10-CM | POA: Diagnosis not present

## 2023-12-06 DIAGNOSIS — S81801D Unspecified open wound, right lower leg, subsequent encounter: Secondary | ICD-10-CM | POA: Diagnosis not present

## 2023-12-06 DIAGNOSIS — S91002D Unspecified open wound, left ankle, subsequent encounter: Secondary | ICD-10-CM | POA: Diagnosis not present

## 2023-12-06 DIAGNOSIS — S91102D Unspecified open wound of left great toe without damage to nail, subsequent encounter: Secondary | ICD-10-CM | POA: Diagnosis not present

## 2023-12-06 DIAGNOSIS — X58XXXD Exposure to other specified factors, subsequent encounter: Secondary | ICD-10-CM | POA: Diagnosis not present

## 2023-12-06 DIAGNOSIS — G8220 Paraplegia, unspecified: Secondary | ICD-10-CM | POA: Diagnosis not present

## 2023-12-06 DIAGNOSIS — S91001D Unspecified open wound, right ankle, subsequent encounter: Secondary | ICD-10-CM | POA: Diagnosis not present

## 2023-12-08 DIAGNOSIS — G8220 Paraplegia, unspecified: Secondary | ICD-10-CM | POA: Diagnosis not present

## 2023-12-08 DIAGNOSIS — J449 Chronic obstructive pulmonary disease, unspecified: Secondary | ICD-10-CM | POA: Diagnosis not present

## 2023-12-08 DIAGNOSIS — I10 Essential (primary) hypertension: Secondary | ICD-10-CM | POA: Diagnosis not present

## 2023-12-08 DIAGNOSIS — L89512 Pressure ulcer of right ankle, stage 2: Secondary | ICD-10-CM | POA: Diagnosis not present

## 2023-12-08 DIAGNOSIS — L89892 Pressure ulcer of other site, stage 2: Secondary | ICD-10-CM | POA: Diagnosis not present

## 2023-12-08 DIAGNOSIS — E1142 Type 2 diabetes mellitus with diabetic polyneuropathy: Secondary | ICD-10-CM | POA: Diagnosis not present

## 2023-12-11 DIAGNOSIS — I10 Essential (primary) hypertension: Secondary | ICD-10-CM | POA: Diagnosis not present

## 2023-12-11 DIAGNOSIS — L89892 Pressure ulcer of other site, stage 2: Secondary | ICD-10-CM | POA: Diagnosis not present

## 2023-12-11 DIAGNOSIS — G8220 Paraplegia, unspecified: Secondary | ICD-10-CM | POA: Diagnosis not present

## 2023-12-11 DIAGNOSIS — L89512 Pressure ulcer of right ankle, stage 2: Secondary | ICD-10-CM | POA: Diagnosis not present

## 2023-12-11 DIAGNOSIS — J449 Chronic obstructive pulmonary disease, unspecified: Secondary | ICD-10-CM | POA: Diagnosis not present

## 2023-12-11 DIAGNOSIS — E1142 Type 2 diabetes mellitus with diabetic polyneuropathy: Secondary | ICD-10-CM | POA: Diagnosis not present

## 2023-12-13 DIAGNOSIS — S81802D Unspecified open wound, left lower leg, subsequent encounter: Secondary | ICD-10-CM | POA: Diagnosis not present

## 2023-12-13 DIAGNOSIS — S81801D Unspecified open wound, right lower leg, subsequent encounter: Secondary | ICD-10-CM | POA: Diagnosis not present

## 2023-12-13 DIAGNOSIS — S91001D Unspecified open wound, right ankle, subsequent encounter: Secondary | ICD-10-CM | POA: Diagnosis not present

## 2023-12-13 DIAGNOSIS — S91102D Unspecified open wound of left great toe without damage to nail, subsequent encounter: Secondary | ICD-10-CM | POA: Diagnosis not present

## 2023-12-13 DIAGNOSIS — S81801A Unspecified open wound, right lower leg, initial encounter: Secondary | ICD-10-CM | POA: Diagnosis not present

## 2023-12-13 DIAGNOSIS — S81801S Unspecified open wound, right lower leg, sequela: Secondary | ICD-10-CM | POA: Diagnosis not present

## 2023-12-13 DIAGNOSIS — S91105D Unspecified open wound of left lesser toe(s) without damage to nail, subsequent encounter: Secondary | ICD-10-CM | POA: Diagnosis not present

## 2023-12-13 DIAGNOSIS — X58XXXD Exposure to other specified factors, subsequent encounter: Secondary | ICD-10-CM | POA: Diagnosis not present

## 2023-12-13 DIAGNOSIS — G8220 Paraplegia, unspecified: Secondary | ICD-10-CM | POA: Diagnosis not present

## 2023-12-13 DIAGNOSIS — S91002D Unspecified open wound, left ankle, subsequent encounter: Secondary | ICD-10-CM | POA: Diagnosis not present

## 2023-12-15 DIAGNOSIS — G8220 Paraplegia, unspecified: Secondary | ICD-10-CM | POA: Diagnosis not present

## 2023-12-15 DIAGNOSIS — J449 Chronic obstructive pulmonary disease, unspecified: Secondary | ICD-10-CM | POA: Diagnosis not present

## 2023-12-15 DIAGNOSIS — I10 Essential (primary) hypertension: Secondary | ICD-10-CM | POA: Diagnosis not present

## 2023-12-15 DIAGNOSIS — E1142 Type 2 diabetes mellitus with diabetic polyneuropathy: Secondary | ICD-10-CM | POA: Diagnosis not present

## 2023-12-15 DIAGNOSIS — L89892 Pressure ulcer of other site, stage 2: Secondary | ICD-10-CM | POA: Diagnosis not present

## 2023-12-15 DIAGNOSIS — L89512 Pressure ulcer of right ankle, stage 2: Secondary | ICD-10-CM | POA: Diagnosis not present

## 2023-12-18 ENCOUNTER — Encounter: Payer: Self-pay | Admitting: "Endocrinology

## 2023-12-18 ENCOUNTER — Ambulatory Visit: Payer: Medicare Other | Admitting: "Endocrinology

## 2023-12-18 VITALS — BP 110/60 | HR 68

## 2023-12-18 DIAGNOSIS — Z7984 Long term (current) use of oral hypoglycemic drugs: Secondary | ICD-10-CM | POA: Diagnosis not present

## 2023-12-18 DIAGNOSIS — L89892 Pressure ulcer of other site, stage 2: Secondary | ICD-10-CM | POA: Diagnosis not present

## 2023-12-18 DIAGNOSIS — J449 Chronic obstructive pulmonary disease, unspecified: Secondary | ICD-10-CM | POA: Diagnosis not present

## 2023-12-18 DIAGNOSIS — E782 Mixed hyperlipidemia: Secondary | ICD-10-CM | POA: Diagnosis not present

## 2023-12-18 DIAGNOSIS — I10 Essential (primary) hypertension: Secondary | ICD-10-CM | POA: Diagnosis not present

## 2023-12-18 DIAGNOSIS — E1142 Type 2 diabetes mellitus with diabetic polyneuropathy: Secondary | ICD-10-CM | POA: Diagnosis not present

## 2023-12-18 DIAGNOSIS — G8220 Paraplegia, unspecified: Secondary | ICD-10-CM | POA: Diagnosis not present

## 2023-12-18 DIAGNOSIS — L89512 Pressure ulcer of right ankle, stage 2: Secondary | ICD-10-CM | POA: Diagnosis not present

## 2023-12-18 DIAGNOSIS — E119 Type 2 diabetes mellitus without complications: Secondary | ICD-10-CM

## 2023-12-18 LAB — POCT GLYCOSYLATED HEMOGLOBIN (HGB A1C)

## 2023-12-18 NOTE — Patient Instructions (Signed)

## 2023-12-18 NOTE — Progress Notes (Signed)
 12/18/2023, 4:50 PM  Endocrinology follow-up note  Subjective:    Patient ID: Patrick Aguilar, male    DOB: [birth year].  Patrick Aguilar is being seen in follow-up after he was seen in consultation for management of currently controlled asymptomatic diabetes requested by  Kirstie Peri, MD.   Past Medical History:  Diagnosis Date   AKI (acute kidney injury) (HCC) 12/2017   Angio-edema    Anxiety    Arthritis    Complication of anesthesia    low respirations, low BP   COPD (chronic obstructive pulmonary disease) (HCC)    DDD (degenerative disc disease), lumbar    Diabetes mellitus without complication (HCC)    type 2   Diastolic dysfunction 12/14/2017   Moderate noted on ECHO   Fournier's gangrene in male    GERD (gastroesophageal reflux disease)    History of ARDS    History of necrotizing fasciitis    Severe   Hypertension    Lumbar spondylosis    Numbness and tingling of both upper extremities    Pulmonary hypertension (HCC) 12/14/2017   Mild, noted on ECHO   Recurrent upper respiratory infection (URI)    Respiratory failure, acute (HCC) 12/2017   Septic shock (HCC) 12/2017   Shortness of breath dyspnea    Testicular pain, left    Wears glasses     Past Surgical History:  Procedure Laterality Date   ADENOIDECTOMY     APPENDECTOMY     APPLICATION OF A-CELL OF CHEST/ABDOMEN N/A 01/08/2018   Procedure: APPLICATION OF A-CELL OF GROIN;  Surgeon: Peggye Form, DO;  Location: [facility];  Service: Plastics;  Laterality: N/A;   APPLICATION OF A-CELL OF EXTREMITY N/A 12/25/2017   Procedure: APPLICATION OF A-CELL;  Surgeon: Peggye Form, DO;  Location: [facility];  Service: Plastics;  Laterality: N/A;   BACK SURGERY     x5   CARDIAC CATHETERIZATION  2006   neg   CERVICAL DISC SURGERY     x2   COLONOSCOPY     DEBRIDEMENT AND CLOSURE WOUND N/A 01/26/2018   Procedure: REVISION OF PERINEUM WOUND WITH DEBRIDEMENT, PARTIAL CLOSURE OF  PERINEUM, PLACEMENT OF CELLERATE COLLAGEN;  Surgeon: Peggye Form, DO;  Location: [facility];  Service: Plastics;  Laterality: N/A;   DENTAL SURGERY     teeth extractions   groin wound  01/08/2018   : EXCISION OF GROIN WOUND WITH PLACEMENT OF ACELL, AND PRIMARY WOUND CLOSURE (N/A Scrotum)   I & D EXTREMITY N/A 03/12/2018   Procedure: IRRIGATION AND DEBRIDEMENT PERIMUM WOUND WITH CLOSURE;  Surgeon: Peggye Form, DO;  Location: [facility];  Service: Plastics;  Laterality: N/A;   INCISION AND DRAINAGE ABSCESS Left 07/11/2018   Procedure: INCISION AND DRAINAGE ABSCESS LEFT THIGH;  Surgeon: Marcine Matar, MD;  Location: [facility];  Service: Urology;  Laterality: Left;   INCISION AND DRAINAGE OF WOUND N/A 12/25/2017   Procedure: Irrigation and debridement of Fournier's of scrotum with placement of testes in subcutaneous thigh pockets and Acell placement;  Surgeon: Peggye Form, DO;  Location: [facility];  Service: Plastics;  Laterality: N/A;   INCISION AND DRAINAGE OF WOUND N/A 01/08/2018   Procedure: EXCISION OF GROIN WOUND WITH PLACEMENT OF ACELL, AND PRIMARY WOUND CLOSURE;  Surgeon: Peggye Form, DO;  Location: [facility];  Service: Plastics;  Laterality: N/A;   ORCHIECTOMY N/A 12/13/2017   Procedure: EXCISION OF SCROTUM AND DEBRIDEMENT OF PENIS;  Surgeon: Retta Diones,  Jeannett Senior, MD;  Location: [facility];  Service: Urology;  Laterality: N/A;   ORCHIECTOMY Left 06/22/2018   Procedure: ORCHIECTOMY;  Surgeon: Marcine Matar, MD;  [facility];  Service: Urology;  Laterality: Left;   PLANTAR FASCIA SURGERY Bilateral    shoulders Bilateral    rotator cuff   SUBMANDIBULAR GLAND EXCISION Left 03/12/2015   Procedure: LEFT SUBMANDIBULAR GLAND RESECTION;  Surgeon: Christia Reading, MD;  Location: [facility] OR;  Service: ENT;  Laterality: Left;   TONSILLECTOMY     TOTAL HIP ARTHROPLASTY Right 03/06/2019   Procedure: TOTAL HIP ARTHROPLASTY ANTERIOR APPROACH;  Surgeon: Ollen Gross, MD;  Location: [facility];  Service: Orthopedics;  Laterality: Right;     Social History   Socioeconomic History   Marital status: Single    Spouse name: Not on file   Number of children: Not on file   Years of education: Not on file   Highest education level: Not on file  Occupational History   Not on file  Tobacco Use   Smoking status: Former    Current packs/day: 0.00    Average packs/day: 1 pack/day for 50.0 years (50.0 ttl pk-yrs)    Types: Cigarettes    Start date: 12/14/1966    Quit date: 12/13/2016    Years since quitting: 7.0   Smokeless tobacco: Never  Vaping Use   Vaping status: Never Used  Substance and Sexual Activity   Alcohol use: No    Comment: quit alcohol 80's   Drug use: No   Sexual activity: Not on file  Other Topics Concern   Not on file  Social History Narrative   Not on file   Social Drivers of Health   Financial Resource Strain: Low Risk  (05/09/2022)   Received from [facility], [facility]   Overall Financial Resource Strain (CARDIA)    Difficulty of Paying Living Expenses: Not hard at all  Food Insecurity: No Food Insecurity (05/09/2022)   Received from [facility], [facility]   Hunger Vital Sign    Worried About Running Out of Food in the Last Year: Never true    Ran Out of Food in the Last Year: Never true  Transportation Needs: No Transportation Needs (05/09/2022)   Received from [facility], [facility]   [facility] - Transportation    Lack of Transportation (Medical): No    Lack of Transportation (Non-Medical): No  Physical Activity: Not on file  Stress: No Stress Concern Present (05/26/2021)   Received from [facility], [facility] - Occupational Stress Questionnaire    Feeling of Stress : Not at all  Social Connections: Unknown (01/11/2022)   Received from [facility], [facility]   Social Network    Social Network: Not on file    Family History   Problem Relation Age of Onset   Lung cancer Mother    Bladder Cancer Father    Allergic rhinitis Neg Hx    Asthma Neg Hx    Eczema Neg Hx    Urticaria Neg Hx     Outpatient Encounter Medications as of 12/18/2023  Medication Sig   albuterol (PROVENTIL) (2.5 MG/3ML) 0.083% nebulizer solution Inhale 2.5 mg into the lungs every 6 (six) hours as needed for shortness of breath or wheezing.   albuterol (VENTOLIN HFA) 108 (90 Base) MCG/ACT inhaler Inhale 2 puffs into the lungs every 6 (six) hours as needed for wheezing or shortness  of breath.   aspirin 81 MG EC tablet Take by mouth.   atenolol-chlorthalidone (TENORETIC) 50-25 MG tablet Take 1 tablet by mouth daily with breakfast.    blood glucose meter kit and supplies KIT Dispense based on patient and insurance preference. Use up to four times daily as directed. (FOR ICD-9 250.00, 250.01).   celecoxib (CELEBREX) 200 MG capsule Take 1 capsule by mouth daily.   doxazosin (CARDURA XL) 8 MG 24 hr tablet Take by mouth.   famotidine (PEPCID) 20 MG tablet Take 20 mg by mouth 2 (two) times daily.   furosemide (LASIX) 20 MG tablet Take 20 mg by mouth daily after breakfast.    gabapentin (NEURONTIN) 800 MG tablet Take 800 mg by mouth every 6 (six) hours.    glipiZIDE (GLUCOTROL) 5 MG tablet Take 5 mg by mouth 2 (two) times daily before a meal.   Insulin Pen Needle (BD PEN NEEDLE NANO U/F) 32G X 4 MM MISC 1 each by Does not apply route 4 (four) times daily. (Patient not taking: Reported on 10/12/2022)   metFORMIN (GLUCOPHAGE) 500 MG tablet Take 500 mg by mouth 2 (two) times daily with a meal.   oxyCODONE-acetaminophen (PERCOCET) 10-325 MG tablet Take 1 tablet by mouth every 6 (six) hours.   OXYGEN Inhale 1-3 L into the lungs.   pantoprazole (PROTONIX) 40 MG tablet Take 40 mg by mouth daily.   potassium chloride (KLOR-CON) 20 MEQ packet Take by mouth.   rosuvastatin (CRESTOR) 5 MG tablet Take 5 mg by mouth every Monday.   sulfamethoxazole-trimethoprim  (BACTRIM) 400-80 MG tablet Take 1 tablet by mouth 2 (two) times daily.   traZODone (DESYREL) 100 MG tablet Take 100 mg by mouth at bedtime.   No facility-administered encounter medications on file as of 12/18/2023.    ALLERGIES: Allergies  Allergen Reactions   Tramadol Other (See Comments)    tremor   Adenosine     can't tolerate  05/2005   Bupropion     Nausea   Morphine And Codeine Itching    VACCINATION STATUS: Immunization History  Administered Date(s) Administered   Td,absorbed, Preservative Free, Adult Use, Lf Unspecified 03/05/2007   Tdap 08/10/2017   Zoster Recombinant(Shingrix) 08/10/2017    Diabetes He presents for his follow-up diabetic visit. He has type 2 diabetes mellitus. Onset time: She was diagnosed at approximate age of 89 years. His disease course has been worsening. There are no hypoglycemic associated symptoms. Pertinent negatives for hypoglycemia include no confusion, headaches, pallor or seizures. There are no diabetic associated symptoms. Pertinent negatives for diabetes include no chest pain, no fatigue, no polydipsia, no polyphagia, no polyuria and no weakness. There are no hypoglycemic complications. Symptoms are worsening. Diabetic complications include nephropathy. Risk factors for coronary artery disease include diabetes mellitus, dyslipidemia, hypertension, male sex and sedentary lifestyle. Current diabetic treatments: Is currently on Metformin 500 mg p.o. once  daily, glipizide 5 mg po BID. His weight is fluctuating minimally. He is following a generally unhealthy diet. When asked about meal planning, he reported none. He has not had a previous visit with a dietitian. He participates in exercise intermittently. His home blood glucose trend is fluctuating minimally. His overall blood glucose range is 130-140 mg/dl. (His presents without any meter nor logs.  He is point-of-care A1c is 7.4%, increasing from 7% during his last visit.  He denies hypoglycemia.      ) An ACE inhibitor/angiotensin II receptor blocker is being taken.  Hyperlipidemia This is a chronic problem. The  current episode started more than 1 year ago. Exacerbating diseases include diabetes. Pertinent negatives include no chest pain, myalgias or shortness of breath. Current antihyperlipidemic treatment includes statins. Risk factors for coronary artery disease include diabetes mellitus, dyslipidemia, male sex, hypertension and a sedentary lifestyle.  Hypertension This is a chronic problem. The current episode started more than 1 year ago. The problem is controlled. Pertinent negatives include no chest pain, headaches, neck pain, palpitations or shortness of breath. Risk factors for coronary artery disease include diabetes mellitus, dyslipidemia, sedentary lifestyle and smoking/tobacco exposure. Past treatments include beta blockers and diuretics. Hypertensive end-organ damage includes heart failure.    Review of Systems  Constitutional:  Negative for chills, fatigue, fever and unexpected weight change.  HENT:  Negative for dental problem, mouth sores and trouble swallowing.   Eyes:  Negative for visual disturbance.  Respiratory:  Negative for cough, choking, chest tightness, shortness of breath and wheezing.   Cardiovascular:  Negative for chest pain, palpitations and leg swelling.  Gastrointestinal:  Negative for abdominal distention, abdominal pain, constipation, diarrhea, nausea and vomiting.  Endocrine: Negative for polydipsia, polyphagia and polyuria.  Genitourinary:  Negative for dysuria, flank pain, hematuria and urgency.  Musculoskeletal:  Positive for gait problem. Negative for back pain, myalgias and neck pain.       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   Skin:  Negative for pallor, rash and wound.  Neurological:  Negative for seizures, syncope, weakness, numbness and headaches.  Psychiatric/Behavioral:  Negative for confusion and dysphoric mood.      Objective:       12/18/2023    2:15 PM 08/17/2023   11:26 AM 02/13/2023    3:01 PM  Vitals with BMI  Systolic 110 120 147  Diastolic 60 62 70  Pulse 68 72 60    BP 110/60   Pulse 68   Wt Readings from Last 3 Encounters:  09/07/22 182 lb 15.7 oz (83 kg)  10/12/21 183 lb (83 kg)  08/16/21 183 lb 3.2 oz (83.1 kg)       CMP ( most recent) CMP     Component Value Date/Time   NA 143 02/22/2023 0000   K 4.0 02/22/2023 0000   CL 100 02/22/2023 0000   CO2 29 (A) 02/22/2023 0000   GLUCOSE 139 (H) 09/07/2022 2033   BUN 33 (A) 02/22/2023 0000   CREATININE 1.0 02/22/2023 0000   CREATININE 1.0 02/22/2023 0000   CREATININE 1.40 (H) 09/07/2022 2033   CREATININE 1.13 05/13/2020 0000   CALCIUM 9.7 02/22/2023 0000   CALCIUM 9.7 02/22/2023 0000   PROT 6.6 04/06/2022 1129   ALBUMIN 3.9 04/06/2022 1129   AST 12 04/06/2022 1129   ALT 13 04/06/2022 1129   ALKPHOS 80 04/06/2022 1129   BILITOT 0.3 04/06/2022 1129   GFRNONAA 54 (L) 09/07/2022 2033   GFRNONAA 66 05/13/2020 0000   GFRAA 76 05/13/2020 0000     Diabetic Labs (most recent): Lab Results  Component Value Date   HGBA1C 7.0 08/17/2023   HGBA1C 5.7 10/12/2022   HGBA1C 6.0 04/11/2022   MICROALBUR <0.2 05/13/2020     Lipid Panel ( most recent) Lipid Panel     Component Value Date/Time   CHOL 130 04/06/2022 1129   TRIG 120 04/06/2022 1129   HDL 46 04/06/2022 1129   CHOLHDL 2.8 04/06/2022 1129   LDLCALC 62 04/06/2022 1129   LABVLDL 22 04/06/2022 1129     Assessment & Plan:   1. Type  2 diabetes mellitus without complication, without long-term current use of insulin (HCC)  - Patrick Aguilar has currently controlled asymptomatic type 2 DM since  73 years of age.   His presents without any meter nor logs.  He is point-of-care A1c is 7.4%, increasing from 7% during his last visit.  He denies hypoglycemia.   - I had a long discussion with him about the progressive nature of diabetes and the pathology behind its  complications. -his diabetes is complicated by  hx of  heavy smoking, deconditioning/sedentary life, diastolic dysfunction and he remains at a high risk for more acute and chronic complications which include CAD, CVA, CKD, retinopathy, and neuropathy. These are all discussed in detail with him.  - I have counseled him on diet  and weight management  by adopting a carbohydrate restricted/protein rich diet. Patient is encouraged to switch to  unprocessed or minimally processed     complex starch and increased protein intake (animal or plant source), fruits, and vegetables. -  he is advised to stick to a routine mealtimes to eat 3 meals  a day and avoid unnecessary snacks ( to snack only to correct hypoglycemia).   - he acknowledges that there is a room for improvement in his food and drink choices. - Suggestion is made for him to avoid simple carbohydrates  from his diet including Cakes, Sweet Desserts, Ice Cream, Soda (diet and regular), Sweet Tea, Candies, Chips, Cookies, Store Bought Juices, Alcohol in Excess of  1-2 drinks a day, Artificial Sweeteners,  Coffee Creamer, and "Sugar-free" Products, Lemonade. This will help patient to have more stable blood glucose profile and potentially avoid unintended weight gain.  - he will be scheduled with Norm Salt, RDN, CDE for diabetes education.  - I have approached him with the following individualized plan to manage  his diabetes and patient agrees:   -In light of his presentation with A1c of 7.4%, he will not need insulin treatment for now.  He is advised to increase his metformin to 500 mg p.o. twice daily with meals.  He is also advised to continue glipizide 5 mg p.o. daily at breakfast and at supper.    He is urged to start monitoring blood glucose twice a day-daily before breakfast and at bedtime.    He is encouraged to call clinic for hypoglycemia under 70 mg per DL, or hyperglycemia above 200 mg per DL at fasting.     He is not a candidate  for SGLT2 inhibitors due to his prior history of Fournier's gangrene.  - Specific targets for  A1c;  LDL, HDL,  and Triglycerides were discussed with the patient.  2) Blood Pressure /Hypertension:  -His blood pressure is controlled to target.  He is advised to discontinue amlodipine.  He is advised to continue  atenolol/HCTZ mg p.o. daily with breakfast .  3) Lipids/Hyperlipidemia:   Review of his recent lipid panel showed improving LDL of 62.  He is advised to continue Crestor 5 mg p.o. nightly.  Side effects and precautions discussed with him.   4)  Weight/Diet: Current documented weight is 182 pounds.  He is not a candidate for major weight loss.  I discussed with him the fact that loss of 5 - 10% of his  current body weight will have the most impact on his diabetes management.  Exercise, and detailed carbohydrates information provided  -  detailed on discharge instructions.  5) Chronic Care/Health Maintenance:  -he  is on Statin medications and  is encouraged to initiate and continue to follow up with Ophthalmology, Dentist,  Podiatrist at least yearly or according to recommendations, and advised to   stay away from smoking. I have recommended yearly flu vaccine and pneumonia vaccine at least every 5 years; moderate intensity exercise for up to 150 minutes weekly; and  sleep for at least 7 hours a day.   - he is  advised to maintain close follow up with Kirstie Peri, MD for primary care needs, as well as his other providers for optimal and coordinated care.    I spent  26  minutes in the care of the patient today including review of labs from CMP, Lipids, Thyroid Function, Hematology (current and previous including abstractions from other facilities); face-to-face time discussing  his blood glucose readings/logs, discussing hypoglycemia and hyperglycemia episodes and symptoms, medications doses, his options of short and long term treatment based on the latest standards of care /  guidelines;  discussion about incorporating lifestyle medicine;  and documenting the encounter. Risk reduction counseling performed per USPSTF guidelines to reduce  cardiovascular risk factors.     Please refer to Patient Instructions for Blood Glucose Monitoring and Insulin/Medications Dosing Guide"  in media tab for additional information. Please  also refer to " Patient Self Inventory" in the Media  tab for reviewed elements of pertinent patient history.  Tish Frederickson participated in the discussions, expressed understanding, and voiced agreement with the above plans.  All questions were answered to his satisfaction. he is encouraged to contact clinic should he have any questions or concerns prior to his return visit.    Follow up plan: - Return in about 4 months (around 04/18/2024) for F/U with Pre-visit Labs, Meter/CGM/Logs, A1c here.  Marquis Lunch, MD [facility] [address] [place], [place] [address] Phone: [phone]  Fax: [phone]    12/18/2023, 4:50 PM  This note was partially dictated with voice recognition software. Similar sounding words can be transcribed inadequately or may not  be corrected upon review.

## 2023-12-20 DIAGNOSIS — X58XXXD Exposure to other specified factors, subsequent encounter: Secondary | ICD-10-CM | POA: Diagnosis not present

## 2023-12-20 DIAGNOSIS — G8220 Paraplegia, unspecified: Secondary | ICD-10-CM | POA: Diagnosis not present

## 2023-12-20 DIAGNOSIS — S81801S Unspecified open wound, right lower leg, sequela: Secondary | ICD-10-CM | POA: Diagnosis not present

## 2023-12-20 DIAGNOSIS — S81801A Unspecified open wound, right lower leg, initial encounter: Secondary | ICD-10-CM | POA: Diagnosis not present

## 2023-12-20 DIAGNOSIS — S91102D Unspecified open wound of left great toe without damage to nail, subsequent encounter: Secondary | ICD-10-CM | POA: Diagnosis not present

## 2023-12-20 DIAGNOSIS — S81801D Unspecified open wound, right lower leg, subsequent encounter: Secondary | ICD-10-CM | POA: Diagnosis not present

## 2023-12-20 DIAGNOSIS — S91105D Unspecified open wound of left lesser toe(s) without damage to nail, subsequent encounter: Secondary | ICD-10-CM | POA: Diagnosis not present

## 2023-12-20 DIAGNOSIS — S91002D Unspecified open wound, left ankle, subsequent encounter: Secondary | ICD-10-CM | POA: Diagnosis not present

## 2023-12-20 DIAGNOSIS — S91001D Unspecified open wound, right ankle, subsequent encounter: Secondary | ICD-10-CM | POA: Diagnosis not present

## 2023-12-20 DIAGNOSIS — S81802D Unspecified open wound, left lower leg, subsequent encounter: Secondary | ICD-10-CM | POA: Diagnosis not present

## 2023-12-22 DIAGNOSIS — L89892 Pressure ulcer of other site, stage 2: Secondary | ICD-10-CM | POA: Diagnosis not present

## 2023-12-22 DIAGNOSIS — L89512 Pressure ulcer of right ankle, stage 2: Secondary | ICD-10-CM | POA: Diagnosis not present

## 2023-12-22 DIAGNOSIS — J449 Chronic obstructive pulmonary disease, unspecified: Secondary | ICD-10-CM | POA: Diagnosis not present

## 2023-12-22 DIAGNOSIS — G8220 Paraplegia, unspecified: Secondary | ICD-10-CM | POA: Diagnosis not present

## 2023-12-22 DIAGNOSIS — I10 Essential (primary) hypertension: Secondary | ICD-10-CM | POA: Diagnosis not present

## 2023-12-22 DIAGNOSIS — E1142 Type 2 diabetes mellitus with diabetic polyneuropathy: Secondary | ICD-10-CM | POA: Diagnosis not present

## 2023-12-25 DIAGNOSIS — J449 Chronic obstructive pulmonary disease, unspecified: Secondary | ICD-10-CM | POA: Diagnosis not present

## 2023-12-25 DIAGNOSIS — L89512 Pressure ulcer of right ankle, stage 2: Secondary | ICD-10-CM | POA: Diagnosis not present

## 2023-12-25 DIAGNOSIS — G8220 Paraplegia, unspecified: Secondary | ICD-10-CM | POA: Diagnosis not present

## 2023-12-25 DIAGNOSIS — E1142 Type 2 diabetes mellitus with diabetic polyneuropathy: Secondary | ICD-10-CM | POA: Diagnosis not present

## 2023-12-25 DIAGNOSIS — L89892 Pressure ulcer of other site, stage 2: Secondary | ICD-10-CM | POA: Diagnosis not present

## 2023-12-25 DIAGNOSIS — I10 Essential (primary) hypertension: Secondary | ICD-10-CM | POA: Diagnosis not present

## 2023-12-27 DIAGNOSIS — S91001D Unspecified open wound, right ankle, subsequent encounter: Secondary | ICD-10-CM | POA: Diagnosis not present

## 2023-12-27 DIAGNOSIS — G8220 Paraplegia, unspecified: Secondary | ICD-10-CM | POA: Diagnosis not present

## 2023-12-27 DIAGNOSIS — S81801A Unspecified open wound, right lower leg, initial encounter: Secondary | ICD-10-CM | POA: Diagnosis not present

## 2023-12-27 DIAGNOSIS — S91002D Unspecified open wound, left ankle, subsequent encounter: Secondary | ICD-10-CM | POA: Diagnosis not present

## 2023-12-27 DIAGNOSIS — S81802D Unspecified open wound, left lower leg, subsequent encounter: Secondary | ICD-10-CM | POA: Diagnosis not present

## 2023-12-27 DIAGNOSIS — S91102D Unspecified open wound of left great toe without damage to nail, subsequent encounter: Secondary | ICD-10-CM | POA: Diagnosis not present

## 2023-12-27 DIAGNOSIS — S81801S Unspecified open wound, right lower leg, sequela: Secondary | ICD-10-CM | POA: Diagnosis not present

## 2023-12-27 DIAGNOSIS — S81801D Unspecified open wound, right lower leg, subsequent encounter: Secondary | ICD-10-CM | POA: Diagnosis not present

## 2023-12-27 DIAGNOSIS — S91105D Unspecified open wound of left lesser toe(s) without damage to nail, subsequent encounter: Secondary | ICD-10-CM | POA: Diagnosis not present

## 2023-12-28 DIAGNOSIS — G8220 Paraplegia, unspecified: Secondary | ICD-10-CM | POA: Diagnosis not present

## 2023-12-28 DIAGNOSIS — Z299 Encounter for prophylactic measures, unspecified: Secondary | ICD-10-CM | POA: Diagnosis not present

## 2023-12-28 DIAGNOSIS — Z0000 Encounter for general adult medical examination without abnormal findings: Secondary | ICD-10-CM | POA: Diagnosis not present

## 2023-12-28 DIAGNOSIS — I10 Essential (primary) hypertension: Secondary | ICD-10-CM | POA: Diagnosis not present

## 2023-12-28 DIAGNOSIS — I700 Atherosclerosis of aorta: Secondary | ICD-10-CM | POA: Diagnosis not present

## 2023-12-28 DIAGNOSIS — R52 Pain, unspecified: Secondary | ICD-10-CM | POA: Diagnosis not present

## 2023-12-29 DIAGNOSIS — L89512 Pressure ulcer of right ankle, stage 2: Secondary | ICD-10-CM | POA: Diagnosis not present

## 2023-12-29 DIAGNOSIS — E1142 Type 2 diabetes mellitus with diabetic polyneuropathy: Secondary | ICD-10-CM | POA: Diagnosis not present

## 2023-12-29 DIAGNOSIS — J449 Chronic obstructive pulmonary disease, unspecified: Secondary | ICD-10-CM | POA: Diagnosis not present

## 2023-12-29 DIAGNOSIS — I10 Essential (primary) hypertension: Secondary | ICD-10-CM | POA: Diagnosis not present

## 2023-12-29 DIAGNOSIS — L89892 Pressure ulcer of other site, stage 2: Secondary | ICD-10-CM | POA: Diagnosis not present

## 2023-12-29 DIAGNOSIS — G8220 Paraplegia, unspecified: Secondary | ICD-10-CM | POA: Diagnosis not present

## 2024-01-01 DIAGNOSIS — I10 Essential (primary) hypertension: Secondary | ICD-10-CM | POA: Diagnosis not present

## 2024-01-01 DIAGNOSIS — L89892 Pressure ulcer of other site, stage 2: Secondary | ICD-10-CM | POA: Diagnosis not present

## 2024-01-01 DIAGNOSIS — G8220 Paraplegia, unspecified: Secondary | ICD-10-CM | POA: Diagnosis not present

## 2024-01-01 DIAGNOSIS — E1142 Type 2 diabetes mellitus with diabetic polyneuropathy: Secondary | ICD-10-CM | POA: Diagnosis not present

## 2024-01-01 DIAGNOSIS — J449 Chronic obstructive pulmonary disease, unspecified: Secondary | ICD-10-CM | POA: Diagnosis not present

## 2024-01-01 DIAGNOSIS — L89512 Pressure ulcer of right ankle, stage 2: Secondary | ICD-10-CM | POA: Diagnosis not present

## 2024-01-03 DIAGNOSIS — S81801S Unspecified open wound, right lower leg, sequela: Secondary | ICD-10-CM | POA: Diagnosis not present

## 2024-01-03 DIAGNOSIS — S81801D Unspecified open wound, right lower leg, subsequent encounter: Secondary | ICD-10-CM | POA: Diagnosis not present

## 2024-01-03 DIAGNOSIS — Z5181 Encounter for therapeutic drug level monitoring: Secondary | ICD-10-CM | POA: Diagnosis not present

## 2024-01-03 DIAGNOSIS — S81802S Unspecified open wound, left lower leg, sequela: Secondary | ICD-10-CM | POA: Diagnosis not present

## 2024-01-03 DIAGNOSIS — X58XXXD Exposure to other specified factors, subsequent encounter: Secondary | ICD-10-CM | POA: Diagnosis not present

## 2024-01-03 DIAGNOSIS — G894 Chronic pain syndrome: Secondary | ICD-10-CM | POA: Diagnosis not present

## 2024-01-03 DIAGNOSIS — S91105D Unspecified open wound of left lesser toe(s) without damage to nail, subsequent encounter: Secondary | ICD-10-CM | POA: Diagnosis not present

## 2024-01-03 DIAGNOSIS — S81802D Unspecified open wound, left lower leg, subsequent encounter: Secondary | ICD-10-CM | POA: Diagnosis not present

## 2024-01-03 DIAGNOSIS — S81801A Unspecified open wound, right lower leg, initial encounter: Secondary | ICD-10-CM | POA: Diagnosis not present

## 2024-01-03 DIAGNOSIS — S91102D Unspecified open wound of left great toe without damage to nail, subsequent encounter: Secondary | ICD-10-CM | POA: Diagnosis not present

## 2024-01-03 DIAGNOSIS — M48062 Spinal stenosis, lumbar region with neurogenic claudication: Secondary | ICD-10-CM | POA: Diagnosis not present

## 2024-01-03 DIAGNOSIS — S91002D Unspecified open wound, left ankle, subsequent encounter: Secondary | ICD-10-CM | POA: Diagnosis not present

## 2024-01-03 DIAGNOSIS — S81802A Unspecified open wound, left lower leg, initial encounter: Secondary | ICD-10-CM | POA: Diagnosis not present

## 2024-01-03 DIAGNOSIS — M4714 Other spondylosis with myelopathy, thoracic region: Secondary | ICD-10-CM | POA: Diagnosis not present

## 2024-01-03 DIAGNOSIS — Z79891 Long term (current) use of opiate analgesic: Secondary | ICD-10-CM | POA: Diagnosis not present

## 2024-01-05 DIAGNOSIS — L89512 Pressure ulcer of right ankle, stage 2: Secondary | ICD-10-CM | POA: Diagnosis not present

## 2024-01-05 DIAGNOSIS — E1142 Type 2 diabetes mellitus with diabetic polyneuropathy: Secondary | ICD-10-CM | POA: Diagnosis not present

## 2024-01-05 DIAGNOSIS — G8220 Paraplegia, unspecified: Secondary | ICD-10-CM | POA: Diagnosis not present

## 2024-01-05 DIAGNOSIS — I10 Essential (primary) hypertension: Secondary | ICD-10-CM | POA: Diagnosis not present

## 2024-01-05 DIAGNOSIS — J449 Chronic obstructive pulmonary disease, unspecified: Secondary | ICD-10-CM | POA: Diagnosis not present

## 2024-01-05 DIAGNOSIS — L89892 Pressure ulcer of other site, stage 2: Secondary | ICD-10-CM | POA: Diagnosis not present

## 2024-01-08 DIAGNOSIS — L89512 Pressure ulcer of right ankle, stage 2: Secondary | ICD-10-CM | POA: Diagnosis not present

## 2024-01-08 DIAGNOSIS — L89892 Pressure ulcer of other site, stage 2: Secondary | ICD-10-CM | POA: Diagnosis not present

## 2024-01-08 DIAGNOSIS — E1142 Type 2 diabetes mellitus with diabetic polyneuropathy: Secondary | ICD-10-CM | POA: Diagnosis not present

## 2024-01-08 DIAGNOSIS — G8220 Paraplegia, unspecified: Secondary | ICD-10-CM | POA: Diagnosis not present

## 2024-01-08 DIAGNOSIS — J449 Chronic obstructive pulmonary disease, unspecified: Secondary | ICD-10-CM | POA: Diagnosis not present

## 2024-01-08 DIAGNOSIS — I10 Essential (primary) hypertension: Secondary | ICD-10-CM | POA: Diagnosis not present

## 2024-01-10 DIAGNOSIS — S81801S Unspecified open wound, right lower leg, sequela: Secondary | ICD-10-CM | POA: Diagnosis not present

## 2024-01-10 DIAGNOSIS — S91002D Unspecified open wound, left ankle, subsequent encounter: Secondary | ICD-10-CM | POA: Diagnosis not present

## 2024-01-10 DIAGNOSIS — S81802A Unspecified open wound, left lower leg, initial encounter: Secondary | ICD-10-CM | POA: Diagnosis not present

## 2024-01-10 DIAGNOSIS — S81801D Unspecified open wound, right lower leg, subsequent encounter: Secondary | ICD-10-CM | POA: Diagnosis not present

## 2024-01-10 DIAGNOSIS — S91102D Unspecified open wound of left great toe without damage to nail, subsequent encounter: Secondary | ICD-10-CM | POA: Diagnosis not present

## 2024-01-10 DIAGNOSIS — S81802D Unspecified open wound, left lower leg, subsequent encounter: Secondary | ICD-10-CM | POA: Diagnosis not present

## 2024-01-10 DIAGNOSIS — S81802S Unspecified open wound, left lower leg, sequela: Secondary | ICD-10-CM | POA: Diagnosis not present

## 2024-01-10 DIAGNOSIS — S91105D Unspecified open wound of left lesser toe(s) without damage to nail, subsequent encounter: Secondary | ICD-10-CM | POA: Diagnosis not present

## 2024-01-10 DIAGNOSIS — S81801A Unspecified open wound, right lower leg, initial encounter: Secondary | ICD-10-CM | POA: Diagnosis not present

## 2024-01-11 ENCOUNTER — Emergency Department (HOSPITAL_COMMUNITY)

## 2024-01-11 ENCOUNTER — Other Ambulatory Visit: Payer: Self-pay

## 2024-01-11 ENCOUNTER — Inpatient Hospital Stay (HOSPITAL_COMMUNITY)
Admission: EM | Admit: 2024-01-11 | Discharge: 2024-01-13 | DRG: 378 | Disposition: A | Discharge status: Home / Self Care | Attending: Family Medicine | Admitting: Family Medicine

## 2024-01-11 ENCOUNTER — Encounter (HOSPITAL_COMMUNITY): Payer: Self-pay

## 2024-01-11 ENCOUNTER — Telehealth: Payer: Self-pay | Admitting: "Endocrinology

## 2024-01-11 DIAGNOSIS — F419 Anxiety disorder, unspecified: Secondary | ICD-10-CM | POA: Diagnosis present

## 2024-01-11 DIAGNOSIS — E1165 Type 2 diabetes mellitus with hyperglycemia: Secondary | ICD-10-CM | POA: Diagnosis present

## 2024-01-11 DIAGNOSIS — K219 Gastro-esophageal reflux disease without esophagitis: Secondary | ICD-10-CM | POA: Diagnosis not present

## 2024-01-11 DIAGNOSIS — Z87891 Personal history of nicotine dependence: Secondary | ICD-10-CM | POA: Diagnosis not present

## 2024-01-11 DIAGNOSIS — L89520 Pressure ulcer of left ankle, unstageable: Secondary | ICD-10-CM | POA: Diagnosis present

## 2024-01-11 DIAGNOSIS — G8220 Paraplegia, unspecified: Secondary | ICD-10-CM | POA: Diagnosis present

## 2024-01-11 DIAGNOSIS — E119 Type 2 diabetes mellitus without complications: Secondary | ICD-10-CM | POA: Diagnosis not present

## 2024-01-11 DIAGNOSIS — Z7984 Long term (current) use of oral hypoglycemic drugs: Secondary | ICD-10-CM | POA: Diagnosis not present

## 2024-01-11 DIAGNOSIS — D649 Anemia, unspecified: Principal | ICD-10-CM | POA: Diagnosis present

## 2024-01-11 DIAGNOSIS — K2980 Duodenitis without bleeding: Secondary | ICD-10-CM | POA: Diagnosis not present

## 2024-01-11 DIAGNOSIS — D509 Iron deficiency anemia, unspecified: Secondary | ICD-10-CM | POA: Diagnosis not present

## 2024-01-11 DIAGNOSIS — I10 Essential (primary) hypertension: Secondary | ICD-10-CM | POA: Diagnosis present

## 2024-01-11 DIAGNOSIS — J449 Chronic obstructive pulmonary disease, unspecified: Secondary | ICD-10-CM | POA: Diagnosis present

## 2024-01-11 DIAGNOSIS — E782 Mixed hyperlipidemia: Secondary | ICD-10-CM | POA: Diagnosis not present

## 2024-01-11 DIAGNOSIS — Z86711 Personal history of pulmonary embolism: Secondary | ICD-10-CM

## 2024-01-11 DIAGNOSIS — Z885 Allergy status to narcotic agent status: Secondary | ICD-10-CM

## 2024-01-11 DIAGNOSIS — Z888 Allergy status to other drugs, medicaments and biological substances status: Secondary | ICD-10-CM

## 2024-01-11 DIAGNOSIS — K254 Chronic or unspecified gastric ulcer with hemorrhage: Principal | ICD-10-CM

## 2024-01-11 DIAGNOSIS — E876 Hypokalemia: Secondary | ICD-10-CM | POA: Diagnosis not present

## 2024-01-11 DIAGNOSIS — Z96641 Presence of right artificial hip joint: Secondary | ICD-10-CM | POA: Diagnosis not present

## 2024-01-11 DIAGNOSIS — Z7982 Long term (current) use of aspirin: Secondary | ICD-10-CM

## 2024-01-11 DIAGNOSIS — Z993 Dependence on wheelchair: Secondary | ICD-10-CM

## 2024-01-11 DIAGNOSIS — N400 Enlarged prostate without lower urinary tract symptoms: Secondary | ICD-10-CM | POA: Diagnosis present

## 2024-01-11 DIAGNOSIS — Z794 Long term (current) use of insulin: Secondary | ICD-10-CM

## 2024-01-11 DIAGNOSIS — N3000 Acute cystitis without hematuria: Secondary | ICD-10-CM | POA: Diagnosis not present

## 2024-01-11 DIAGNOSIS — J432 Centrilobular emphysema: Secondary | ICD-10-CM | POA: Diagnosis not present

## 2024-01-11 DIAGNOSIS — Z8739 Personal history of other diseases of the musculoskeletal system and connective tissue: Secondary | ICD-10-CM

## 2024-01-11 DIAGNOSIS — Z9079 Acquired absence of other genital organ(s): Secondary | ICD-10-CM

## 2024-01-11 DIAGNOSIS — K2950 Unspecified chronic gastritis without bleeding: Secondary | ICD-10-CM | POA: Diagnosis not present

## 2024-01-11 DIAGNOSIS — L89510 Pressure ulcer of right ankle, unstageable: Secondary | ICD-10-CM | POA: Diagnosis not present

## 2024-01-11 DIAGNOSIS — S2241XA Multiple fractures of ribs, right side, initial encounter for closed fracture: Secondary | ICD-10-CM | POA: Diagnosis not present

## 2024-01-11 DIAGNOSIS — I700 Atherosclerosis of aorta: Secondary | ICD-10-CM | POA: Diagnosis not present

## 2024-01-11 DIAGNOSIS — I272 Pulmonary hypertension, unspecified: Secondary | ICD-10-CM | POA: Diagnosis present

## 2024-01-11 DIAGNOSIS — T148XXA Other injury of unspecified body region, initial encounter: Secondary | ICD-10-CM | POA: Diagnosis present

## 2024-01-11 DIAGNOSIS — Z79899 Other long term (current) drug therapy: Secondary | ICD-10-CM

## 2024-01-11 HISTORY — DX: Benign prostatic hyperplasia without lower urinary tract symptoms: N40.0

## 2024-01-11 HISTORY — DX: Mixed hyperlipidemia: E78.2

## 2024-01-11 HISTORY — DX: Paraplegia, unspecified: G82.20

## 2024-01-11 HISTORY — DX: Other pulmonary embolism without acute cor pulmonale: I26.99

## 2024-01-11 LAB — CBC WITH DIFFERENTIAL/PLATELET
Abs Immature Granulocytes: 0.06 10*3/uL (ref 0.00–0.07)
Basophils Absolute: 0.1 10*3/uL (ref 0.0–0.1)
Basophils Relative: 1 %
Eosinophils Absolute: 0.5 10*3/uL (ref 0.0–0.5)
Eosinophils Relative: 4 %
HCT: 25.3 % — ABNORMAL LOW (ref 39.0–52.0)
Hemoglobin: 7.1 g/dL — ABNORMAL LOW (ref 13.0–17.0)
Immature Granulocytes: 1 %
Lymphocytes Relative: 11 %
Lymphs Abs: 1.4 10*3/uL (ref 0.7–4.0)
MCH: 18.5 pg — ABNORMAL LOW (ref 26.0–34.0)
MCHC: 28.1 g/dL — ABNORMAL LOW (ref 30.0–36.0)
MCV: 65.9 fL — ABNORMAL LOW (ref 80.0–100.0)
Monocytes Absolute: 0.8 10*3/uL (ref 0.1–1.0)
Monocytes Relative: 6 %
Neutro Abs: 9.7 10*3/uL — ABNORMAL HIGH (ref 1.7–7.7)
Neutrophils Relative %: 77 %
Platelets: 367 10*3/uL (ref 150–400)
RBC: 3.84 MIL/uL — ABNORMAL LOW (ref 4.22–5.81)
RDW: 19.8 % — ABNORMAL HIGH (ref 11.5–15.5)
Smear Review: NORMAL
WBC: 12.4 10*3/uL — ABNORMAL HIGH (ref 4.0–10.5)
nRBC: 0 % (ref 0.0–0.2)

## 2024-01-11 LAB — COMPREHENSIVE METABOLIC PANEL WITH GFR
ALT: 13 U/L (ref 0–44)
AST: 12 U/L — ABNORMAL LOW (ref 15–41)
Albumin: 3.3 g/dL — ABNORMAL LOW (ref 3.5–5.0)
Alkaline Phosphatase: 64 U/L (ref 38–126)
Anion gap: 14 (ref 5–15)
BUN: 31 mg/dL — ABNORMAL HIGH (ref 8–23)
CO2: 27 mmol/L (ref 22–32)
Calcium: 9.4 mg/dL (ref 8.9–10.3)
Chloride: 95 mmol/L — ABNORMAL LOW (ref 98–111)
Creatinine, Ser: 1.12 mg/dL (ref 0.61–1.24)
GFR, Estimated: >60 mL/min (ref >60)
Glucose, Bld: 168 mg/dL — ABNORMAL HIGH (ref 70–99)
Potassium: 3.1 mmol/L — ABNORMAL LOW (ref 3.5–5.1)
Sodium: 136 mmol/L (ref 135–145)
Total Bilirubin: 0.3 mg/dL (ref 0.0–1.2)
Total Protein: 7.1 g/dL (ref 6.5–8.1)

## 2024-01-11 LAB — URINALYSIS, ROUTINE W REFLEX MICROSCOPIC
Bilirubin Urine: NEGATIVE
Glucose, UA: NEGATIVE mg/dL
Hgb urine dipstick: NEGATIVE
Ketones, ur: NEGATIVE mg/dL
Nitrite: NEGATIVE
Protein, ur: NEGATIVE mg/dL
Specific Gravity, Urine: 1.012 (ref 1.005–1.030)
WBC, UA: >50 WBC/hpf (ref 0–5)
pH: 6 (ref 5.0–8.0)

## 2024-01-11 LAB — FERRITIN: Ferritin: 14 ng/mL — ABNORMAL LOW (ref 24–336)

## 2024-01-11 LAB — CBG MONITORING, ED: Glucose-Capillary: 159 mg/dL — ABNORMAL HIGH (ref 70–99)

## 2024-01-11 LAB — GLUCOSE, CAPILLARY
Glucose-Capillary: 145 mg/dL — ABNORMAL HIGH (ref 70–99)
Glucose-Capillary: 180 mg/dL — ABNORMAL HIGH (ref 70–99)

## 2024-01-11 LAB — POC OCCULT BLOOD, ED: Fecal Occult Bld: NEGATIVE

## 2024-01-11 LAB — RETICULOCYTES
Immature Retic Fract: 29.2 % — ABNORMAL HIGH (ref 2.3–15.9)
RBC.: 3.58 MIL/uL — ABNORMAL LOW (ref 4.22–5.81)
Retic Count, Absolute: 70.5 10*3/uL (ref 19.0–186.0)
Retic Ct Pct: 2 % (ref 0.4–3.1)

## 2024-01-11 LAB — IRON AND TIBC
Iron: 14 ug/dL — ABNORMAL LOW (ref 45–182)
Saturation Ratios: 5 % — ABNORMAL LOW (ref 17.9–39.5)
TIBC: 305 ug/dL (ref 250–450)
UIBC: 291 ug/dL

## 2024-01-11 LAB — VITAMIN B12: Vitamin B-12: 628 pg/mL (ref 180–914)

## 2024-01-11 LAB — MAGNESIUM: Magnesium: 2.2 mg/dL (ref 1.7–2.4)

## 2024-01-11 LAB — PREPARE RBC (CROSSMATCH)

## 2024-01-11 LAB — FOLATE: Folate: 21 ng/mL (ref >5.9)

## 2024-01-11 LAB — HEMOGLOBIN A1C
Hgb A1c MFr Bld: 7.1 % — ABNORMAL HIGH (ref 4.8–5.6)
Mean Plasma Glucose: 157.07 mg/dL

## 2024-01-11 MED ORDER — OXYCODONE-ACETAMINOPHEN 5-325 MG PO TABS
1.0000 | ORAL_TABLET | ORAL | Status: DC | PRN
  Administered 2024-01-12: 1 via ORAL
  Filled 2024-01-11: qty 1

## 2024-01-11 MED ORDER — POTASSIUM CHLORIDE CRYS ER 20 MEQ PO TBCR
40.0000 meq | EXTENDED_RELEASE_TABLET | Freq: Once | ORAL | Status: AC
  Administered 2024-01-11: 40 meq via ORAL
  Filled 2024-01-11: qty 2

## 2024-01-11 MED ORDER — ATENOLOL 25 MG PO TABS
50.0000 mg | ORAL_TABLET | Freq: Every day | ORAL | Status: DC
  Administered 2024-01-12 – 2024-01-13 (×2): 50 mg via ORAL
  Filled 2024-01-11 (×2): qty 2

## 2024-01-11 MED ORDER — FUROSEMIDE 20 MG PO TABS: 20.0000 mg | ORAL_TABLET | Freq: Every day | ORAL | Status: DC

## 2024-01-11 MED ORDER — ONDANSETRON HCL 4 MG/2ML IJ SOLN: 4.0000 mg | Freq: Four times a day (QID) | INTRAMUSCULAR | Status: DC | PRN

## 2024-01-11 MED ORDER — INSULIN ASPART 100 UNIT/ML IJ SOLN: 0.0000 [IU] | Freq: Every day | INTRAMUSCULAR | Status: DC

## 2024-01-11 MED ORDER — POLYETHYLENE GLYCOL 3350 17 G PO PACK: 17.0000 g | PACK | Freq: Every day | ORAL | Status: DC | PRN

## 2024-01-11 MED ORDER — TRAZODONE HCL 50 MG PO TABS
100.0000 mg | ORAL_TABLET | Freq: Every day | ORAL | Status: DC
  Administered 2024-01-11 – 2024-01-12 (×2): 100 mg via ORAL
  Filled 2024-01-11 (×2): qty 2

## 2024-01-11 MED ORDER — OXYCODONE-ACETAMINOPHEN 10-325 MG PO TABS
1.0000 | ORAL_TABLET | Freq: Four times a day (QID) | ORAL | Status: DC
  Filled 2024-01-11 (×8): qty 1

## 2024-01-11 MED ORDER — BACLOFEN 10 MG PO TABS
10.0000 mg | ORAL_TABLET | Freq: Two times a day (BID) | ORAL | Status: DC
  Administered 2024-01-11 – 2024-01-13 (×4): 10 mg via ORAL
  Filled 2024-01-11 (×4): qty 1

## 2024-01-11 MED ORDER — ACETAMINOPHEN 650 MG RE SUPP: 650.0000 mg | Freq: Four times a day (QID) | RECTAL | Status: DC | PRN

## 2024-01-11 MED ORDER — DOXYCYCLINE HYCLATE 100 MG PO TABS
100.0000 mg | ORAL_TABLET | Freq: Two times a day (BID) | ORAL | Status: DC
  Administered 2024-01-11 – 2024-01-13 (×4): 100 mg via ORAL
  Filled 2024-01-11 (×4): qty 1

## 2024-01-11 MED ORDER — GABAPENTIN 400 MG PO CAPS
800.0000 mg | ORAL_CAPSULE | Freq: Every day | ORAL | Status: DC
  Administered 2024-01-12 – 2024-01-13 (×2): 800 mg via ORAL
  Filled 2024-01-11 (×2): qty 2

## 2024-01-11 MED ORDER — FENTANYL CITRATE PF 50 MCG/ML IJ SOSY
25.0000 ug | PREFILLED_SYRINGE | Freq: Once | INTRAMUSCULAR | Status: AC
  Administered 2024-01-11: 25 ug via INTRAVENOUS
  Filled 2024-01-11: qty 1

## 2024-01-11 MED ORDER — DOXAZOSIN MESYLATE 4 MG PO TABS
8.0000 mg | ORAL_TABLET | Freq: Every day | ORAL | Status: DC
  Administered 2024-01-12 – 2024-01-13 (×2): 8 mg via ORAL
  Filled 2024-01-11 (×2): qty 4
  Filled 2024-01-11 (×3): qty 2

## 2024-01-11 MED ORDER — INSULIN ASPART 100 UNIT/ML IJ SOLN
0.0000 [IU] | Freq: Three times a day (TID) | INTRAMUSCULAR | Status: DC
  Administered 2024-01-12 – 2024-01-13 (×3): 2 [IU] via SUBCUTANEOUS
  Administered 2024-01-13: 5 [IU] via SUBCUTANEOUS

## 2024-01-11 MED ORDER — ATENOLOL-CHLORTHALIDONE 50-25 MG PO TABS: 1.0000 | ORAL_TABLET | Freq: Every day | ORAL | Status: DC

## 2024-01-11 MED ORDER — ACETAMINOPHEN 325 MG PO TABS: 650.0000 mg | ORAL_TABLET | Freq: Four times a day (QID) | ORAL | Status: DC | PRN

## 2024-01-11 MED ORDER — PANTOPRAZOLE SODIUM 40 MG IV SOLR
40.0000 mg | INTRAVENOUS | Status: DC
  Administered 2024-01-12 (×2): 40 mg via INTRAVENOUS
  Filled 2024-01-11 (×2): qty 10

## 2024-01-11 MED ORDER — FUROSEMIDE 10 MG/ML IJ SOLN
20.0000 mg | Freq: Once | INTRAMUSCULAR | Status: AC
  Administered 2024-01-12: 20 mg via INTRAVENOUS
  Filled 2024-01-11: qty 2

## 2024-01-11 MED ORDER — GABAPENTIN 400 MG PO CAPS
2400.0000 mg | ORAL_CAPSULE | Freq: Every day | ORAL | Status: DC
  Administered 2024-01-11 – 2024-01-12 (×2): 2400 mg via ORAL
  Filled 2024-01-11 (×2): qty 6

## 2024-01-11 MED ORDER — SODIUM CHLORIDE 0.9% IV SOLUTION: Freq: Once | INTRAVENOUS | Status: DC

## 2024-01-11 MED ORDER — ONDANSETRON HCL 4 MG PO TABS: 4.0000 mg | ORAL_TABLET | Freq: Four times a day (QID) | ORAL | Status: DC | PRN

## 2024-01-11 MED ORDER — ALBUTEROL SULFATE (2.5 MG/3ML) 0.083% IN NEBU: 2.5000 mg | INHALATION_SOLUTION | Freq: Four times a day (QID) | RESPIRATORY_TRACT | Status: DC | PRN

## 2024-01-11 MED ORDER — GABAPENTIN 800 MG PO TABS: 800.0000 mg | ORAL_TABLET | ORAL | Status: DC

## 2024-01-11 MED ORDER — CHLORTHALIDONE 25 MG PO TABS: 25.0000 mg | ORAL_TABLET | Freq: Every day | ORAL | Status: DC

## 2024-01-11 NOTE — Telephone Encounter (Signed)
 error

## 2024-01-11 NOTE — Progress Notes (Signed)
   01/11/24 2045  TOC Brief Assessment  Insurance and Status Reviewed  Patient has primary care physician Yes  Home environment has been reviewed From home  Prior level of function: Assisted  Prior/Current Home Services Current home services  Social Drivers of Health Review SDOH reviewed no interventions necessary  Readmission risk has been reviewed Yes  Transition of care needs no transition of care needs at this time   Pt is paraplegic x3 years, has around-the-clock aide. Active c/[facility] for wound care. Is able to transfer to a car c/a slide board and assistance. Has hospital bed, Hoyer lift, wc,

## 2024-01-11 NOTE — Assessment & Plan Note (Signed)
Stable.  Resume home regimen 

## 2024-01-11 NOTE — Assessment & Plan Note (Addendum)
 Potassium 3.1.  Magnesium  2.2.  He is on chlorthalidone  and lasix  20mg  daily.

## 2024-01-11 NOTE — Assessment & Plan Note (Signed)
 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  No suggestion of cellulitis, but dressings have significant amount of dark drainage. Patient reports wounds are improving.  Mild leukocytosis of 12.4.  Afebrile. -Wound care consult -Will start a course of doxycycline  oral 100 twice daily

## 2024-01-11 NOTE — H&P (Addendum)
 History and Physical    Patrick Aguilar:[id number] DOB: 05-Sep-1951 DOA: 01/11/2024  PCP: Theoplis Fix, MD   Patient coming from: Home  I have personally briefly reviewed patient's old medical records in [facility]  Chief Complaint: Fatigue, right rib pain  HPI: Patrick Aguilar is a 73 y.o. male with medical history significant for COPD, diabetes mellitus, hypertension, paraplegia.  Patient was referred to the ED from his endocrinology office due to complaints of high blood sugars over the past 2 days with sugars running in the 300s, he also reports fatigue over the past several days, and right rib pain.  He has chronic bilateral lower extremity wounds that he reports are improving.  He follows with wound care center in [place] for weekly dressing changes he also has dressing changes twice weekly by his caregivers. Patient reports about 2 to 3 days ago, he was rolled over for diaper change by his caregiver when his caregiver lost his footing and fell on him.  He heard his rib pop, and he has subsequently had persistent pain to his right side. Another of his caregiver is at bedside, they deny any black stools or blood in stools.  No vomiting of blood.  No abdominal pain.  No difficulty breathing no chest pain.  Fevers no chills.  He has a chronic condom catheter.  ED Course: Temperature 97.7.  Heart rate 50s to 60s.  Respiratory rate 13-23.  Blood pressure systolic 101-137.  O2 sats greater than 91% on room air. Potassium 3.1.  WBC 12.4.  Stool FOBT negative. Hemoglobin down to 7.1 from baseline 11-13.  Potassium 3.1. CT chest without contrast shows mildly displaced right anterior posteriorlateral 7th through 9th ribs fractures. 1 Unit PRBC ordered for transfusion. Hospitalist to admit for anemia.  Review of Systems: As per HPI all other systems reviewed and negative.  Past Medical History:  Diagnosis Date   AKI (acute kidney injury) (HCC) 12/2017   Angio-edema    Anxiety     Arthritis    BPH (benign prostatic hyperplasia)    Complication of anesthesia    low respirations, low BP   COPD (chronic obstructive pulmonary disease) (HCC)    DDD (degenerative disc disease), lumbar    Diabetes mellitus without complication (HCC)    type 2   Diastolic dysfunction 12/14/2017   Moderate noted on ECHO   Fournier's gangrene in male    GERD (gastroesophageal reflux disease)    History of ARDS    History of necrotizing fasciitis    Severe   Hypertension    Lumbar spondylosis    Mixed hyperlipidemia    Numbness and tingling of both upper extremities    Paraplegia [facility] [place])    January 2023   PE (pulmonary thromboembolism) (HCC)    Pulmonary hypertension (HCC) 12/14/2017   Mild, noted on ECHO   Recurrent upper respiratory infection (URI)    Respiratory failure, acute (HCC) 12/2017   Septic shock (HCC) 12/2017   Shortness of breath dyspnea    Testicular pain, left    Wears glasses     Past Surgical History:  Procedure Laterality Date   ADENOIDECTOMY     APPENDECTOMY     APPLICATION OF A-CELL OF CHEST/ABDOMEN N/A 01/08/2018   Procedure: APPLICATION OF A-CELL OF GROIN;  Surgeon: Thornell Flirt, DO;  Location: [facility];  Service: Plastics;  Laterality: N/A;   APPLICATION OF A-CELL OF EXTREMITY N/A 12/25/2017   Procedure: APPLICATION OF A-CELL;  Surgeon: Thornell Flirt,  DO;  Location: [facility];  Service: Plastics;  Laterality: N/A;   BACK SURGERY     x5   CARDIAC CATHETERIZATION  2006   neg   CERVICAL DISC SURGERY     x2   COLONOSCOPY     DEBRIDEMENT AND CLOSURE WOUND N/A 01/26/2018   Procedure: REVISION OF PERINEUM WOUND WITH DEBRIDEMENT, PARTIAL CLOSURE OF PERINEUM, PLACEMENT OF CELLERATE COLLAGEN;  Surgeon: Thornell Flirt, DO;  Location: [facility];  Service: Plastics;  Laterality: N/A;   DENTAL SURGERY     teeth extractions   groin wound  01/08/2018   : EXCISION OF GROIN WOUND WITH PLACEMENT OF ACELL, AND PRIMARY WOUND CLOSURE (N/A Scrotum)   I &  D EXTREMITY N/A 03/12/2018   Procedure: IRRIGATION AND DEBRIDEMENT PERIMUM WOUND WITH CLOSURE;  Surgeon: Thornell Flirt, DO;  Location: [facility];  Service: Plastics;  Laterality: N/A;   INCISION AND DRAINAGE ABSCESS Left 07/11/2018   Procedure: INCISION AND DRAINAGE ABSCESS LEFT THIGH;  Surgeon: Trent Frizzle, MD;  Location: [facility];  Service: Urology;  Laterality: Left;   INCISION AND DRAINAGE OF WOUND N/A 12/25/2017   Procedure: Irrigation and debridement of Fournier's of scrotum with placement of testes in subcutaneous thigh pockets and Acell placement;  Surgeon: Thornell Flirt, DO;  Location: [facility];  Service: Plastics;  Laterality: N/A;   INCISION AND DRAINAGE OF WOUND N/A 01/08/2018   Procedure: EXCISION OF GROIN WOUND WITH PLACEMENT OF ACELL, AND PRIMARY WOUND CLOSURE;  Surgeon: Thornell Flirt, DO;  Location: [facility];  Service: Plastics;  Laterality: N/A;   ORCHIECTOMY N/A 12/13/2017   Procedure: EXCISION OF SCROTUM AND DEBRIDEMENT OF PENIS;  Surgeon: Trent Frizzle, MD;  Location: [facility];  Service: Urology;  Laterality: N/A;   ORCHIECTOMY Left 06/22/2018   Procedure: ORCHIECTOMY;  Surgeon: Trent Frizzle, MD;  Location: [facility];  Service: Urology;  Laterality: Left;   PLANTAR FASCIA SURGERY Bilateral    shoulders Bilateral    rotator cuff   SUBMANDIBULAR GLAND EXCISION Left 03/12/2015   Procedure: LEFT SUBMANDIBULAR GLAND RESECTION;  Surgeon: Virgina Grills, MD;  Location: [facility] OR;  Service: ENT;  Laterality: Left;   TONSILLECTOMY     TOTAL HIP ARTHROPLASTY Right 03/06/2019   Procedure: TOTAL HIP ARTHROPLASTY ANTERIOR APPROACH;  Surgeon: Liliane Rei, MD;  Location: [facility];  Service: Orthopedics;  Laterality: Right;      reports that he quit smoking about 7 years ago. His smoking use included cigarettes. He started smoking about 57 years ago. He has a 50 pack-year smoking history. He has never used smokeless tobacco. He reports that he  does not drink alcohol and does not use drugs.  Allergies  Allergen Reactions   Tramadol  Other (See Comments)    tremor   Adenosine     can't tolerate  05/2005   Bupropion     Nausea   Morphine  And Codeine Itching    Family History  Problem Relation Age of Onset   Lung cancer Mother    Bladder Cancer Father    Allergic rhinitis Neg Hx    Asthma Neg Hx    Eczema Neg Hx    Urticaria Neg Hx    Prior to Admission medications   Medication Sig Start Date End Date Taking? Authorizing Provider  albuterol  (PROVENTIL ) (2.5 MG/3ML) 0.083% nebulizer solution Inhale 2.5 mg into the lungs every 6 (six) hours as needed for shortness of breath or wheezing. 12/06/14  Yes [provider]  albuterol  (  VENTOLIN  HFA) 108 (90 Base) MCG/ACT inhaler Inhale 2 puffs into the lungs every 6 (six) hours as needed for wheezing or shortness of breath.   Yes [provider]  aspirin  81 MG EC tablet Take by mouth. 06/02/21  Yes [provider]  atenolol -chlorthalidone  (TENORETIC ) 50-25 MG tablet Take 1 tablet by mouth daily with breakfast.  02/09/15  Yes [provider]  baclofen  (LIORESAL ) 10 MG tablet Take 10 mg by mouth 2 (two) times daily.   Yes [provider]  celecoxib (CELEBREX) 200 MG capsule Take 1 capsule by mouth daily.   Yes [provider]  doxazosin  (CARDURA  XL) 8 MG 24 hr tablet Take 8 mg by mouth daily with breakfast.   Yes [provider]  famotidine  (PEPCID ) 20 MG tablet Take 20 mg by mouth daily. 05/31/19  Yes [provider]  Fexofenadine HCl (ALLERGY 24-HR PO) Take by mouth.   Yes [provider]  furosemide  (LASIX ) 20 MG tablet Take 20 mg by mouth daily after breakfast.    Yes [provider]  gabapentin  (NEURONTIN ) 800 MG tablet Take 800 mg by mouth See admin instructions. Take 1 tablet in the morning and 3 tablets every evening. 02/09/15  Yes [provider]  glipiZIDE  (GLUCOTROL ) 5 MG tablet Take 5  mg by mouth 2 (two) times daily before a meal.   Yes [provider]  metFORMIN  (GLUCOPHAGE ) 500 MG tablet Take 500 mg by mouth 2 (two) times daily with a meal. Take 1/2 taingblet in the morning and 1/2 every evening   Yes [provider]  oxyCODONE -acetaminophen  (PERCOCET) 10-325 MG tablet Take 1 tablet by mouth every 6 (six) hours. 08/12/19  Yes [provider]  pantoprazole  (PROTONIX ) 40 MG tablet Take 40 mg by mouth daily.   Yes [provider]  potassium chloride  (KLOR-CON ) 20 MEQ packet Take 20 mEq by mouth daily. Take 2 tablets every morning   Yes [provider]  rosuvastatin (CRESTOR) 5 MG tablet Take 5 mg by mouth once a week. Every Sunday 07/11/19  Yes [provider]  traZODone  (DESYREL ) 100 MG tablet Take 100 mg by mouth See admin instructions. 2 tablets at bedtime 11/29/17  Yes [provider]  blood glucose meter kit and supplies KIT Dispense based on patient and insurance preference. Use up to four times daily as directed. (FOR ICD-9 250.00, 250.01). 12/28/17   Lavaughn Portland, MD  Insulin  Pen Needle (BD PEN NEEDLE NANO U/F) 32G X 4 MM MISC 1 each by Does not apply route 4 (four) times daily. Patient not taking: Reported on 10/12/2022 06/09/22   Baby Bolt, MD    Physical Exam: Vitals:   01/11/24 1300 01/11/24 1511 01/11/24 1525 01/11/24 1526  BP: 137/66 121/60    Pulse: (!) 56 64 61 61  Resp: 16 13 18 19   Temp:      TempSrc:      SpO2: 96%  95% 95%  Weight:      Height:        Constitutional: NAD, calm, comfortable Vitals:   01/11/24 1300 01/11/24 1511 01/11/24 1525 01/11/24 1526  BP: 137/66 121/60    Pulse: (!) 56 64 61 61  Resp: 16 13 18 19   Temp:      TempSrc:      SpO2: 96%  95% 95%  Weight:      Height:       Eyes: PERRL, lids and conjunctivae normal ENMT: Mucous membranes are moist.  Neck: normal, supple, no masses, no thyromegaly Respiratory: clear to auscultation bilaterally, no wheezing,  no crackles. Normal respiratory effort. No accessory muscle use.  Cardiovascular: Regular rate and rhythm, no murmurs / rubs / gallops.  No extremity edema, extremities warm. Abdomen: no tenderness, no masses palpated. No hepatosplenomegaly. Bowel sounds positive.  Musculoskeletal: no clubbing / cyanosis. No joint deformity upper and lower extremities. Good ROM, no contractures. Normal muscle tone.  Skin: Bilateral lower extremity wounds dressed, dressings clean, see pictures attached taken by EDP.  Previous dressings appears soiled with drainage. Neurologic:  No facial asymmetry, moving upper extremities spontaneously, paraplegic.Aaron Aas  Psychiatric: Normal judgment and insight. Alert and oriented x 3. Normal mood.            Labs on Admission: I have personally reviewed following labs and imaging studies  CBC: Recent Labs  Lab 01/11/24 1123  WBC 12.4*  NEUTROABS 9.7*  HGB 7.1*  HCT 25.3*  MCV 65.9*  PLT 367   Basic Metabolic Panel: Recent Labs  Lab 01/11/24 1123  NA 136  K 3.1*  CL 95*  CO2 27  GLUCOSE 168*  BUN 31*  CREATININE 1.12  CALCIUM 9.4   GFR: Estimated Creatinine Clearance: 60.6 mL/min (by C-G formula based on SCr of 1.12 mg/dL). Liver Function Tests: Recent Labs  Lab 01/11/24 1123  AST 12*  ALT 13  ALKPHOS 64  BILITOT 0.3  PROT 7.1  ALBUMIN  3.3*   CBG: Recent Labs  Lab 01/11/24 1029  GLUCAP 159*   Anemia Panel: Recent Labs    01/11/24 1447  RETICCTPCT 2.0   Urine analysis:    Component Value Date/Time   COLORURINE YELLOW 01/11/2024 1315   APPEARANCEUR HAZY (A) 01/11/2024 1315   LABSPEC 1.012 01/11/2024 1315   PHURINE 6.0 01/11/2024 1315   GLUCOSEU NEGATIVE 01/11/2024 1315   HGBUR NEGATIVE 01/11/2024 1315   BILIRUBINUR NEGATIVE 01/11/2024 1315   KETONESUR NEGATIVE 01/11/2024 1315   PROTEINUR NEGATIVE 01/11/2024 1315   UROBILINOGEN 1.0 02/02/2010 1048   NITRITE NEGATIVE 01/11/2024 1315   LEUKOCYTESUR LARGE (A) 01/11/2024 1315     Radiological Exams on Admission: CT Chest Wo Contrast Result Date: 01/11/2024 CLINICAL DATA:  Chest trauma, blunt EXAM: CT CHEST WITHOUT CONTRAST TECHNIQUE: Multidetector CT imaging of the chest was performed following the standard protocol without IV contrast. RADIATION DOSE REDUCTION: This exam was performed according to the departmental dose-optimization program which includes automated exposure control, adjustment of the mA and/or kV according to patient size and/or use of iterative reconstruction technique. COMPARISON:  01/11/2024 FINDINGS: Cardiovascular: No cardiomegaly. Trace pericardial effusion. No aortic aneurysm. Multi-vessel coronary atherosclerosis. Diffuse calcified atherosclerosis throughout the aorta. Mediastinum/Nodes: No mediastinal mass. No mediastinal, hilar, or axillary lymphadenopathy. Lungs/Pleura: The midline trachea and bronchi are patent. Confluent centrilobular and paraseptal emphysema within the upper lobes bilaterally. Fibrolinear scarring predominantly in the subpleural regions throughout the lungs. No focal airspace consolidation, pleural effusion, or pneumothorax. Calcified pleural plaque in the posterior right lung base. Musculoskeletal: Minimally displaced posterolateral right seventh rib fracture. Mildly displaced fractures of the posterolateral right eighth and ninth ribs. Multilevel degenerative disc disease of the spine. Posterior fusion hardware at the cervicothoracic junction. Osteopenia. Subcutaneous edema interdigitating within the right chest wall musculature. Small volume symmetric bilateral gynecomastia. Upper Abdomen: No acute abnormality in the partially visualized upper abdomen. IMPRESSION: 1. Redemonstrated, mildly displaced rib fractures of the right posterolateral 7-9th ribs. No pneumothorax. 2. Confluent centrilobular and paraseptal emphysema. No superimposed pneumonia or pulmonary edema. No pleural effusion. Aortic Atherosclerosis (  ICD10-I70.0).  Electronically Signed   By: Rance Burrows M.D.   On: 01/11/2024 14:41   DG Ribs Unilateral W/Chest Right Result Date: 01/11/2024 CLINICAL DATA:  pain, injury. EXAM: RIGHT RIBS AND CHEST - 3+ VIEW COMPARISON:  None Available. FINDINGS: Bilateral lung fields are clear. Bilateral costophrenic angles are clear. Normal cardio-mediastinal silhouette. There are mildly displaced fractures of the right posterolateral seventh through ninth ribs. Cervicothoracic spinal fixation hardware noted. Metallic anchors overlying the right proximal humerus, likely from prior rotator cuff repair. The soft tissues are within normal limits. IMPRESSION: Mildly displaced fractures of the right posterolateral seventh through ninth ribs. No pneumothorax or pleural effusion. Electronically Signed   By: Beula Brunswick M.D.   On: 01/11/2024 11:55   EKG: None   Assessment/Plan Principal Problem:   Acute anemia Active Problems:   Chronic wound   Hypokalemia   Essential hypertension, benign   COPD  GOLD  0     Type 2 diabetes mellitus without complication, without long-term current use of insulin  (HCC)   Assessment and Plan: * Acute anemia Hemoglobin 7.1, baseline 12-13 last checked a year ago.  Fecal occult negative.,  With brown stool noted by EDP.  Denies melena hematochezia or hematemesis.  No abdominal pain.  Not on anticoagulation.  No colonoscopies or EGD on file.  Reports his last colonoscopy was about 3 years ago. He is on 81 mg Aspirin  and celecoxib. -Anemia panel done in ED suggest iron deficiency anemia with low ferritin- 14, low serum iron - 14 and saturation-5.  - Transfuse 1 unit PRBC -GI consult in a.m. -N.p.o. midnight - IV Protonix  40 daily  - IV lasix  20mg  x 1   Hypokalemia Potassium 3.1.  Magnesium  2.2.  He is on chlorthalidone  and lasix  20mg  daily.  Chronic wound Chronic wounds to bilateral lower extremity.  No suggestion of cellulitis, but dressings have significant amount of dark drainage.  Patient reports wounds are improving.  Mild leukocytosis of 12.4.  Afebrile. -Wound care consult -Will start a course of doxycycline  oral 100 twice daily  Type 2 diabetes mellitus without complication, without long-term current use of insulin  (HCC) - HgbA1c - SSI- S - Hold glipizide , metformin  while inpatient  COPD  GOLD  0   Stable.  Resume home regimen  Essential hypertension, benign Stable. -Resume atenolol  chlorthalidone  in a.m., doxazosin  - Resume home 20 mg Lasix  in a.m.   DVT prophylaxis: SCDS Code Status: Full code Family Communication: None at bedside. One of patients care givers present. Disposition Plan: ~ 2 days Consults called:  GI Admission status:  Obs tele     Author: Pati Bonine, MD 01/11/2024 5:46 PM  For on call review [email or web address].

## 2024-01-11 NOTE — ED Provider Notes (Addendum)
 [facility] AT [facility] Provider Note   CSN: [id number] Arrival date & time: 01/11/24  1017     History  Chief Complaint  Patient presents with   Hyperglycemia    Patrick Aguilar is a 73 y.o. male.  Serious spinal cord injury and is wheelchair-bound at baseline.  He also has diabetes, history of chronic wounds, use condom catheter at home.  The ER today for evaluation of blood sugar.  He is on metformin  and glipizide , reports he has been compliant with the use but for the past 2 days blood sugars been running in the 2 and 300s.  He called his endocrinology office today and spoke with the nurse who told him he could come to the ER for further evaluation.  Denies fever or chills, no diet changes, not on steroids recently.  He does have chronic lower extremity wounds.  He goes to the wound center in [place] for dressing changes weekly and has dressing changes twice weekly at home by caregivers.  He states he was told by wound center this week that his wound seem to be improving.  Denies increased pain or fever.  His other complaint is right lateral rib pain.  He states his friend was trying to help him and fell against him and has been having soreness in the area since then.  No shortness of breath.  He states he felt a pop and is worried about possible broken rib.   Hyperglycemia      Home Medications Prior to Admission medications   Medication Sig Start Date End Date Taking? Authorizing Provider  albuterol  (PROVENTIL ) (2.5 MG/3ML) 0.083% nebulizer solution Inhale 2.5 mg into the lungs every 6 (six) hours as needed for shortness of breath or wheezing. 12/06/14   [provider]  albuterol  (VENTOLIN  HFA) 108 (90 Base) MCG/ACT inhaler Inhale 2 puffs into the lungs every 6 (six) hours as needed for wheezing or shortness of breath.    [provider]  aspirin  81 MG EC tablet Take by mouth. 06/02/21   [provider]   atenolol -chlorthalidone  (TENORETIC ) 50-25 MG tablet Take 1 tablet by mouth daily with breakfast.  02/09/15   [provider]  blood glucose meter kit and supplies KIT Dispense based on patient and insurance preference. Use up to four times daily as directed. (FOR ICD-9 250.00, 250.01). 12/28/17   Lavaughn Portland, MD  celecoxib (CELEBREX) 200 MG capsule Take 1 capsule by mouth daily.    [provider]  doxazosin  (CARDURA  XL) 8 MG 24 hr tablet Take by mouth.    [provider]  famotidine  (PEPCID ) 20 MG tablet Take 20 mg by mouth 2 (two) times daily. 05/31/19   [provider]  furosemide  (LASIX ) 20 MG tablet Take 20 mg by mouth daily after breakfast.     [provider]  gabapentin  (NEURONTIN ) 800 MG tablet Take 800 mg by mouth every 6 (six) hours.  02/09/15   [provider]  glipiZIDE  (GLUCOTROL ) 5 MG tablet Take 5 mg by mouth 2 (two) times daily before a meal.    [provider]  Insulin  Pen Needle (BD PEN NEEDLE NANO U/F) 32G X 4 MM MISC 1 each by Does not apply route 4 (four) times daily. Patient not taking: Reported on 10/12/2022 06/09/22   Nida, Gebreselassie W, MD  metFORMIN  (GLUCOPHAGE ) 500 MG tablet Take 500 mg by mouth 2 (two) times daily with a meal.    [provider]  oxyCODONE -acetaminophen  (PERCOCET) 10-325 MG tablet Take 1 tablet by mouth every 6 (six) hours. 08/12/19   [provider]  OXYGEN  Inhale 1-3 L into the lungs.    [provider]  pantoprazole  (PROTONIX ) 40 MG tablet Take 40 mg by mouth daily.    [provider]  potassium chloride  (KLOR-CON ) 20 MEQ packet Take by mouth.    [provider]  rosuvastatin (CRESTOR) 5 MG tablet Take 5 mg by mouth every Monday. 07/11/19   [provider]  sulfamethoxazole -trimethoprim  (BACTRIM ) 400-80 MG tablet Take 1 tablet by mouth 2 (two) times daily. 10/12/22   Nida, Gebreselassie W, MD  traZODone  (DESYREL ) 100 MG tablet Take 100 mg by  mouth at bedtime. 11/29/17   [provider]      Allergies    Tramadol , Adenosine, Bupropion, and Morphine  and codeine    Review of Systems   Review of Systems  Physical Exam Updated Vital Signs There were no vitals taken for this visit. Physical Exam Vitals and nursing note reviewed.  Constitutional:      General: He is not in acute distress.    Appearance: He is well-developed.  HENT:     Head: Normocephalic and atraumatic.     Mouth/Throat:     Mouth: Mucous membranes are moist.  Eyes:     Extraocular Movements: Extraocular movements intact.     Conjunctiva/sclera: Conjunctivae normal.     Pupils: Pupils are equal, round, and reactive to light.  Cardiovascular:     Rate and Rhythm: Normal rate and regular rhythm.     Heart sounds: No murmur heard. Pulmonary:     Effort: Pulmonary effort is normal. No respiratory distress.     Breath sounds: Normal breath sounds.  Chest:     Chest wall: Tenderness present. No crepitus.     Comments: Noticed right lower posterior lateral rib cage Abdominal:     Palpations: Abdomen is soft.     Tenderness: There is no abdominal tenderness.  Genitourinary:    Rectum: Guaiac result negative.  Musculoskeletal:        General: No swelling.     Cervical back: Neck supple.     Right lower leg: No edema.     Left lower leg: No edema.     Comments: Feet are warm well-perfused with intact pulses  Skin:    General: Skin is warm and dry.     Capillary Refill: Capillary refill takes less than 2 seconds.     Comments: Bilateral lower extremity wounds as pictured below.  There is drainage but no surrounding cellulitis or crepitus.  No swelling.  No warmth.  Neurological:     Mental Status: He is alert and oriented to person, place, and time. Mental status is at baseline.     Comments: Baseline wheelchair bound   Psychiatric:        Mood and Affect: Mood normal.            ED Results / Procedures / Treatments   Labs (all  labs ordered are listed, but only abnormal results are displayed) Labs Reviewed  CBG MONITORING, ED - Abnormal; Notable for the following components:      Result Value   Glucose-Capillary 159 (*)    All other components within normal limits    EKG None  Radiology No results found.  Procedures .Critical Care  Performed by: Aimee Houseman, PA-C Authorized by: Aimee Houseman, PA-C   Critical care provider statement:  Critical care time (minutes):  30   Critical care was necessary to treat or prevent imminent or life-threatening deterioration of the following conditions: Critical anemia requiring transfusion.   Critical care was time spent personally by me on the following activities:  Development of treatment plan with patient or surrogate, discussions with consultants, evaluation of patient's response to treatment, examination of patient, ordering and review of laboratory studies, ordering and review of radiographic studies, ordering and performing treatments and interventions, pulse oximetry, re-evaluation of patient's condition, review of old charts and obtaining history from patient or surrogate   Care discussed with: admitting provider       Medications Ordered in ED Medications - No data to display  ED Course/ Medical Decision Making/ A&P                                 Medical Decision Making This patient presents to the ED for concern of updated blood sugars for the past several days and right posterior lateral rib pain, this involves an extensive number of treatment options, and is a complaint that carries with it a high risk of complications and morbidity.  The differential diagnosis includes ischemia, DKA, HHS, infection, rib fracture, pneumonia, other   Co morbidities that complicate the patient evaluation :   Diabetes, wheelchair-bound   Additional history obtained:  Additional history obtained from EMR External records from outside source obtained and  reviewed including prior notes and labs   Lab Tests:  I Ordered, and personally interpreted labs.  The pertinent results include:   CBC-notable for slight increased white blood cell count of 12.4 and significant anemia at 7.1 hemoglobin, last  was 12.8 with microcytosis CMP shows mild hypokalemia, glucose 168 FOBT negative UA positive for leukocytes large greater than 50 white blood cells and rare bacteria   Imaging Studies ordered:  I ordered imaging studies including chest x-ray which shows lateral rib fractures 7 through 9 on the right I independently visualized and interpreted imaging within scope of identifying emergent findings  I agree with the radiologist interpretation    Consultations Obtained:  I requested consultation with the hospitalist,  and discussed lab and imaging findings as well as pertinent plan - they recommend: Admission   Problem List / ED Course / Critical interventions / Medication management  Sent here for hyperglycemia-this could be in the setting of UTI which is being treated with Rocephin.  Urine culture sent.  Patient incontinent at baseline.  Noted to be anemic with hemoglobin 7.1, stool is soft, brown and guaiac negative, likely iron deficiency given microcytosis.  He is very symptomatic, states been very fatigued and having generalized weakness, noted to be pale will transfuse 1 unit.  Discussed with hospitalist for further admission.  Also noted patient has to be posterior lateral rib fractures after caregiver excellently stumbled and fell on his right side of his chest 2 days ago, no pneumothorax, patient having pain.  Will benefit from admission for this as well given increased risk of pneumonia mortality given his age with 3 rib fractures.  I have reviewed the patients home medicines and have made adjustments as needed   Social Determinants of Health:  Patient lives at home but has caregivers come to help.      Amount and/or Complexity of  Data Reviewed Labs: ordered. Radiology: ordered.  Risk Prescription drug management. Decision regarding hospitalization.  Final Clinical Impression(s) / ED Diagnoses Final diagnoses:  None    Rx / DC Orders ED Discharge Orders     None         Aimee Houseman, PA-C 01/11/24 1925    Joshua Nieves 01/11/24 1926    Karlyn Overman, MD 01/12/24 [phone]

## 2024-01-11 NOTE — Consult Note (Addendum)
 WOC Nurse Consult Note: patient with longstanding wound to B lower legs and pressure injuries to B ankles; followed at [facility]; last seen there 01/10/2024 where wounds were debrided and orders placed for Vaseline gauze  Reason for Consult: B lower leg wounds  Wound type: 1. Full thickness B lower legs  2.  Unstageable Pressure Injuries B ankles  Pressure Injury POA: Yes Measurement: see nursing flowsheet  Wound bed:L and L leg wounds with tan slough, L medial ankle 75% dark eschar 25% pink, R medial ankle tan slough  Drainage (amount, consistency, odor) appears to have heavy tan exudate on old dressings  Periwound: some erythema, maceration to R medial ankle  Dressing procedure/placement/frequency:  Cleanse B lower leg and ankle wounds with Vashe wound cleanser Timm Foot [phone]), do not rinse and allow to air dry.  Apply Vaseline gauze ([place] [phone]) to wound beds daily, cover with ABD pads and Kerlix roll gauze beginning right above toes and ending right below knees.  May apply Ace bandage wrapped in same fashion as Kerlix if desired.    Patient sees wound care center weekly for debridement and assessment. Patient should continue to follow with wound care center as outpatient.   POC discussed with bedside nurse.  WOc team will not follow. Re-consult if further needs arise.   Thank you,    Ronni Colace MSN, RN-BC, [employer] [phone]

## 2024-01-11 NOTE — Assessment & Plan Note (Addendum)
 Stable. -Resume atenolol  chlorthalidone  in a.m., doxazosin  - Resume home 20 mg Lasix  in a.m.

## 2024-01-11 NOTE — Assessment & Plan Note (Signed)
-   HgbA1c - SSI- S - Hold glipizide , metformin  while inpatient

## 2024-01-11 NOTE — ED Triage Notes (Addendum)
 Pt arrived via POV after being advised to seek further evaluation in the ER from his PCP office. Pt reports difficulty managing his blood sugar. Pt presents with wounds on lower extremities, chronic condom catheter use, and is non-ambulatory, wheelchair bound at baseline.

## 2024-01-11 NOTE — Assessment & Plan Note (Addendum)
 Hemoglobin 7.1, baseline 12-13 last checked a year ago.  Fecal occult negative.,  With brown stool noted by EDP.  Denies melena hematochezia or hematemesis.  No abdominal pain.  Not on anticoagulation.  No colonoscopies or EGD on file.  Reports his last colonoscopy was about 3 years ago. He is on 81 mg Aspirin  and celecoxib. -Anemia panel done in ED suggest iron deficiency anemia with low ferritin- 14, low serum iron - 14 and saturation-5.  - Transfuse 1 unit PRBC -GI consult in a.m. -N.p.o. midnight - IV Protonix  40 daily  - IV lasix  20mg  x 1

## 2024-01-11 NOTE — ED Notes (Signed)
 ED Provider at bedside.

## 2024-01-11 NOTE — Care Management Obs Status (Signed)
 MEDICARE OBSERVATION STATUS NOTIFICATION   Patient Details  Name: Patrick Aguilar MRN: [id number] Date of Birth: [birth year]   Medicare Observation Status Notification Given:  Yes    Geraldina Klinefelter, RN 01/11/2024, 7:40 PM

## 2024-01-12 ENCOUNTER — Encounter (HOSPITAL_COMMUNITY): Payer: Self-pay | Admitting: Internal Medicine

## 2024-01-12 ENCOUNTER — Observation Stay (HOSPITAL_COMMUNITY): Admitting: Certified Registered"

## 2024-01-12 ENCOUNTER — Telehealth: Payer: Self-pay | Admitting: Gastroenterology

## 2024-01-12 ENCOUNTER — Encounter (HOSPITAL_COMMUNITY)
Admission: EM | Disposition: A | Discharge status: Home / Self Care | Payer: Self-pay | Source: Home / Self Care | Attending: Internal Medicine

## 2024-01-12 DIAGNOSIS — E876 Hypokalemia: Secondary | ICD-10-CM | POA: Diagnosis present

## 2024-01-12 DIAGNOSIS — K2950 Unspecified chronic gastritis without bleeding: Secondary | ICD-10-CM | POA: Diagnosis not present

## 2024-01-12 DIAGNOSIS — N3000 Acute cystitis without hematuria: Secondary | ICD-10-CM | POA: Diagnosis present

## 2024-01-12 DIAGNOSIS — Z87891 Personal history of nicotine dependence: Secondary | ICD-10-CM | POA: Diagnosis not present

## 2024-01-12 DIAGNOSIS — L89520 Pressure ulcer of left ankle, unstageable: Secondary | ICD-10-CM | POA: Diagnosis present

## 2024-01-12 DIAGNOSIS — I10 Essential (primary) hypertension: Secondary | ICD-10-CM

## 2024-01-12 DIAGNOSIS — D509 Iron deficiency anemia, unspecified: Secondary | ICD-10-CM | POA: Diagnosis present

## 2024-01-12 DIAGNOSIS — I272 Pulmonary hypertension, unspecified: Secondary | ICD-10-CM | POA: Diagnosis present

## 2024-01-12 DIAGNOSIS — Z7984 Long term (current) use of oral hypoglycemic drugs: Secondary | ICD-10-CM | POA: Diagnosis not present

## 2024-01-12 DIAGNOSIS — Z7982 Long term (current) use of aspirin: Secondary | ICD-10-CM | POA: Diagnosis not present

## 2024-01-12 DIAGNOSIS — Z794 Long term (current) use of insulin: Secondary | ICD-10-CM | POA: Diagnosis not present

## 2024-01-12 DIAGNOSIS — Z993 Dependence on wheelchair: Secondary | ICD-10-CM | POA: Diagnosis not present

## 2024-01-12 DIAGNOSIS — N400 Enlarged prostate without lower urinary tract symptoms: Secondary | ICD-10-CM | POA: Diagnosis present

## 2024-01-12 DIAGNOSIS — K254 Chronic or unspecified gastric ulcer with hemorrhage: Secondary | ICD-10-CM

## 2024-01-12 DIAGNOSIS — E782 Mixed hyperlipidemia: Secondary | ICD-10-CM | POA: Diagnosis present

## 2024-01-12 DIAGNOSIS — E1165 Type 2 diabetes mellitus with hyperglycemia: Secondary | ICD-10-CM | POA: Diagnosis present

## 2024-01-12 DIAGNOSIS — J449 Chronic obstructive pulmonary disease, unspecified: Secondary | ICD-10-CM | POA: Diagnosis not present

## 2024-01-12 DIAGNOSIS — S2241XA Multiple fractures of ribs, right side, initial encounter for closed fracture: Secondary | ICD-10-CM | POA: Diagnosis present

## 2024-01-12 DIAGNOSIS — D649 Anemia, unspecified: Secondary | ICD-10-CM | POA: Diagnosis not present

## 2024-01-12 DIAGNOSIS — L89510 Pressure ulcer of right ankle, unstageable: Secondary | ICD-10-CM | POA: Diagnosis present

## 2024-01-12 DIAGNOSIS — K219 Gastro-esophageal reflux disease without esophagitis: Secondary | ICD-10-CM | POA: Diagnosis present

## 2024-01-12 DIAGNOSIS — Z96641 Presence of right artificial hip joint: Secondary | ICD-10-CM | POA: Diagnosis present

## 2024-01-12 DIAGNOSIS — K2980 Duodenitis without bleeding: Secondary | ICD-10-CM

## 2024-01-12 DIAGNOSIS — F419 Anxiety disorder, unspecified: Secondary | ICD-10-CM | POA: Diagnosis present

## 2024-01-12 DIAGNOSIS — Z79899 Other long term (current) drug therapy: Secondary | ICD-10-CM | POA: Diagnosis not present

## 2024-01-12 DIAGNOSIS — G8220 Paraplegia, unspecified: Secondary | ICD-10-CM | POA: Diagnosis present

## 2024-01-12 HISTORY — PX: ESOPHAGOGASTRODUODENOSCOPY: SHX5428

## 2024-01-12 LAB — BASIC METABOLIC PANEL WITH GFR
Anion gap: 10 (ref 5–15)
BUN: 28 mg/dL — ABNORMAL HIGH (ref 8–23)
CO2: 26 mmol/L (ref 22–32)
Calcium: 8.9 mg/dL (ref 8.9–10.3)
Chloride: 101 mmol/L (ref 98–111)
Creatinine, Ser: 1.01 mg/dL (ref 0.61–1.24)
GFR, Estimated: >60 mL/min (ref >60)
Glucose, Bld: 129 mg/dL — ABNORMAL HIGH (ref 70–99)
Potassium: 3.6 mmol/L (ref 3.5–5.1)
Sodium: 137 mmol/L (ref 135–145)

## 2024-01-12 LAB — CBC
HCT: 25.1 % — ABNORMAL LOW (ref 39.0–52.0)
Hemoglobin: 7.7 g/dL — ABNORMAL LOW (ref 13.0–17.0)
MCH: 20.3 pg — ABNORMAL LOW (ref 26.0–34.0)
MCHC: 30.7 g/dL (ref 30.0–36.0)
MCV: 66.2 fL — ABNORMAL LOW (ref 80.0–100.0)
Platelets: 335 10*3/uL (ref 150–400)
RBC: 3.79 MIL/uL — ABNORMAL LOW (ref 4.22–5.81)
RDW: 21.6 % — ABNORMAL HIGH (ref 11.5–15.5)
WBC: 11.4 10*3/uL — ABNORMAL HIGH (ref 4.0–10.5)
nRBC: 0 % (ref 0.0–0.2)

## 2024-01-12 LAB — GLUCOSE, CAPILLARY
Glucose-Capillary: 189 mg/dL — ABNORMAL HIGH (ref 70–99)
Glucose-Capillary: 187 mg/dL — ABNORMAL HIGH (ref 70–99)
Glucose-Capillary: 183 mg/dL — ABNORMAL HIGH (ref 70–99)
Glucose-Capillary: 187 mg/dL — ABNORMAL HIGH (ref 70–99)
Glucose-Capillary: 192 mg/dL — ABNORMAL HIGH (ref 70–99)

## 2024-01-12 LAB — URINE CULTURE

## 2024-01-12 LAB — TYPE AND SCREEN
ABO/RH(D): B POS
Antibody Screen: NEGATIVE
Unit division: 0

## 2024-01-12 LAB — BPAM RBC
Blood Product Expiration Date: 202505122359
ISSUE DATE / TIME: 202505012059
Unit Type and Rh: 5100

## 2024-01-12 LAB — HEMOGLOBIN AND HEMATOCRIT, BLOOD
HCT: 26.7 % — ABNORMAL LOW (ref 39.0–52.0)
HCT: 26.3 % — ABNORMAL LOW (ref 39.0–52.0)
Hemoglobin: 8.2 g/dL — ABNORMAL LOW (ref 13.0–17.0)
Hemoglobin: 8 g/dL — ABNORMAL LOW (ref 13.0–17.0)

## 2024-01-12 SURGERY — EGD (ESOPHAGOGASTRODUODENOSCOPY)
Anesthesia: General

## 2024-01-12 MED ORDER — OXYCODONE-ACETAMINOPHEN 10-325 MG PO TABS: 1.0000 | ORAL_TABLET | Freq: Four times a day (QID) | ORAL | Status: DC | PRN

## 2024-01-12 MED ORDER — OXYCODONE-ACETAMINOPHEN 5-325 MG PO TABS: 1.0000 | ORAL_TABLET | Freq: Four times a day (QID) | ORAL | Status: DC

## 2024-01-12 MED ORDER — SODIUM CHLORIDE (PF) 0.9 % IJ SOLN
PREFILLED_SYRINGE | INTRAMUSCULAR | Status: DC | PRN
  Administered 2024-01-12: 3 mL

## 2024-01-12 MED ORDER — OXYCODONE HCL 5 MG PO TABS
5.0000 mg | ORAL_TABLET | Freq: Four times a day (QID) | ORAL | Status: DC | PRN
  Administered 2024-01-13: 5 mg via ORAL
  Filled 2024-01-12: qty 1

## 2024-01-12 MED ORDER — PROPOFOL 10 MG/ML IV BOLUS
INTRAVENOUS | Status: DC | PRN
  Administered 2024-01-12: 20 mg via INTRAVENOUS
  Administered 2024-01-12: 75 mg via INTRAVENOUS
  Administered 2024-01-12: 20 mg via INTRAVENOUS
  Administered 2024-01-12 (×2): 25 mg via INTRAVENOUS
  Administered 2024-01-12: 20 mg via INTRAVENOUS

## 2024-01-12 MED ORDER — OXYCODONE-ACETAMINOPHEN 5-325 MG PO TABS
1.0000 | ORAL_TABLET | Freq: Four times a day (QID) | ORAL | Status: DC | PRN
  Administered 2024-01-12 – 2024-01-13 (×2): 1 via ORAL
  Filled 2024-01-12 (×2): qty 1

## 2024-01-12 MED ORDER — OXYCODONE HCL 5 MG PO TABS: 5.0000 mg | ORAL_TABLET | Freq: Four times a day (QID) | ORAL | Status: DC

## 2024-01-12 MED ORDER — LACTATED RINGERS IV SOLN: INTRAVENOUS | Status: DC | PRN

## 2024-01-12 NOTE — Hospital Course (Addendum)
 72 yom w/ COPD, DM HTN paraplegia referred to the ED from his endocrinology office due to complaints of high blood sugars over the past 2 days with sugars running in the 300s, he also reports fatigue over the past several days, and right rib pain after his caregiver lost his footing and fell on him  2-3 days PTA. He has chronic bilateral lower extremity wounds that he reports are improving followed at wound care center in [place] weekly w/ dressing changes he also has dressing changes twice weekly by his caregivers.  In ED: Vitals fairly stable, labs hypokalemia leukocytosis FOBT negative anemic with hb down to 7.1 from baseline 11-13. CT chest>>mildly displaced right anterior posteriorlateral 7th through 9th ribs fractures.  UA large leukocytes WBC >50, urine culture sent 1 Unit PRBC ordered for transfusion and admission requested.   Subjective: Seen and examined Mildly sleepy but no complaints Overnight afebrile BP 99-100, on room air.  Labs reviewed stable BMP, CBC with mild leukocytosis hemoglobin  up 7.1> 7.7, anemia panel with low ferritin and iron B12 folate normal, FOBT negative.  Assessment and Plan:  Iron deficiency anemia, symptomatic:  Workup shows iron deficiency anemia, FOBT negative, no obvious bleeding.  At home on Celebrex, aspirin  otherwise no anticoagulantsHb baseline 12-13  a year ago now 7.1. s/p 1 u prbc and up at 7.7 Reports his last colonoscopy was about 3 years ago.  GI consulted planning for endoscopic evaluation today .Monitor EGD showed oozing gastric ulcer. Injected. Clipped and Biopsied and also w/ Duodenitis> GI suggested to continue full liquid diet 5/2 and 8085/3 continue PPI twice daily follow-up pathology results and follow-up in the clinic with no hide his aspirin , NSAIDs, follow-up with GI in 4 weeks  Recent Labs  Lab 01/11/24 1123 01/12/24 0411 01/12/24 0930  HGB 7.1* 7.7* 8.2*  HCT 25.3* 25.1* 26.7*     Pyuria: Urine culture pending.   Monitor  Hypokalemia: Resolved.   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  T2DM, without long-term current use of insulin :  Apparently hyperglycemic in 300 at home currently blood sugar well-controlled <200. A1c 7.5 continue SSI holding glipizide  metformin    COPD: Stable.  Continue home inhalers  Essential hypertension, benign BP soft, continue atenolol .  Holding chlorthalidone  and Lasix 

## 2024-01-12 NOTE — Progress Notes (Signed)
 2020 Pt refused lab to draw lab work insisted to draw from peripheral. Educated we can't do that. Pt still insisted and refused labs.

## 2024-01-12 NOTE — Consult Note (Signed)
 Gastroenterology Consult   Referring Provider: No ref. provider found Primary Care Physician:  Theoplis Fix, MD Primary Gastroenterologist:  unassigned Urban Garden, MD)  Patient ID: Patrick Aguilar; [id number]; 12/08/1950   Admit date: 01/11/2024  LOS: 0 days   Date of Consultation: 01/12/2024  Reason for Consultation:  anemia  History of Present Illness   Patrick Aguilar is a 73 y.o. year old male with history of COPD, diabetes, HTN, paraplegia c/b multiple pressure injuries who presented to the ED at recommendation of endocrinology given 2 days of BG in the 300s as well as fatigue and right rib pain which was likely caused by injury form caregiver landing on him. Hgb found to be significantly decreased from last check. GI consulted for further evaluation.  ED Course: Labs - Hgb 7.1 (12.8 in Dec 2023), MCV 66, WBC 11.4, iron 14, saturation 5%, ferritin 14. Normal B12 and folate. AU with + leukocytes and rare bacteria. Bun elevated at 31. A1c 7.1 Stool heme negative.  CT chest without contrast with mildly displaced right anterior posteriolateral 7-9th rib fxs.  1u PRBC ordered.  Started on PPI IV daily.   Caregiver has denied any melena or brbpr at home. No CG emesis, hematemesis, abdominal pain, chest pain, or shortness of breath other than since caregiver fell on him. Fatigue has been occurring. Noted to be on ASA 81 mg and celebrex.   Today:  Patient again reports he has had some fatigue recently as well as some right-sided chest pain which occurred after his caregiver fell on him.  He reports being on aspirin  81 mg and Celebrex long-term.  He does use some occasional NSAIDs but denies it on a daily basis, states he only uses them as needed and the type varies between Aleve , Advil, and ibuprofen.  Denies any tobacco use or alcohol use.  Denies melena or BRBPR at home.  Denies any constipation or diarrhea although he does report he takes MiraLAX  daily given all of his  medications that he is on.  Currently denies any abdominal pain, nausea, vomiting, or dysphagia.  Does take pantoprazole  at home as well as famotidine  as needed.  Prior EGD: never Prior Colonoscopy: Patient unsure of timing however states it was last done in [place] at [facility]  [place] findings.   Past Medical History:  Diagnosis Date   AKI (acute kidney injury) (HCC) 12/2017   Angio-edema    Anxiety    Arthritis    BPH (benign prostatic hyperplasia)    Complication of anesthesia    low respirations, low BP   COPD (chronic obstructive pulmonary disease) (HCC)    DDD (degenerative disc disease), lumbar    Diabetes mellitus without complication (HCC)    type 2   Diastolic dysfunction 12/14/2017   Moderate noted on ECHO   Fournier's gangrene in male    GERD (gastroesophageal reflux disease)    History of ARDS    History of necrotizing fasciitis    Severe   Hypertension    Lumbar spondylosis    Mixed hyperlipidemia    Numbness and tingling of both upper extremities    Paraplegia [facility])    January 2023   PE (pulmonary thromboembolism) (HCC)    Pulmonary hypertension (HCC) 12/14/2017   Mild, noted on ECHO   Recurrent upper respiratory infection (URI)    Respiratory failure, acute (HCC) 12/2017   Septic shock (HCC) 12/2017   Shortness of breath dyspnea    Testicular pain, left  Wears glasses     Past Surgical History:  Procedure Laterality Date   ADENOIDECTOMY     APPENDECTOMY     APPLICATION OF A-CELL OF CHEST/ABDOMEN N/A 01/08/2018   Procedure: APPLICATION OF A-CELL OF GROIN;  Surgeon: Thornell Flirt, DO;  Location: [facility] OR;  Service: Plastics;  Laterality: N/A;   APPLICATION OF A-CELL OF EXTREMITY N/A 12/25/2017   Procedure: APPLICATION OF A-CELL;  Surgeon: Thornell Flirt, DO;  Location: [facility];  Service: Plastics;  Laterality: N/A;   BACK SURGERY     x5   CARDIAC CATHETERIZATION  2006   neg   CERVICAL DISC SURGERY     x2   COLONOSCOPY      DEBRIDEMENT AND CLOSURE WOUND N/A 01/26/2018   Procedure: REVISION OF PERINEUM WOUND WITH DEBRIDEMENT, PARTIAL CLOSURE OF PERINEUM, PLACEMENT OF CELLERATE COLLAGEN;  Surgeon: Thornell Flirt, DO;  Location: [facility];  Service: Plastics;  Laterality: N/A;   DENTAL SURGERY     teeth extractions   groin wound  01/08/2018   : EXCISION OF GROIN WOUND WITH PLACEMENT OF ACELL, AND PRIMARY WOUND CLOSURE (N/A Scrotum)   I & D EXTREMITY N/A 03/12/2018   Procedure: IRRIGATION AND DEBRIDEMENT PERIMUM WOUND WITH CLOSURE;  Surgeon: Thornell Flirt, DO;  Location: [facility];  Service: Plastics;  Laterality: N/A;   INCISION AND DRAINAGE ABSCESS Left 07/11/2018   Procedure: INCISION AND DRAINAGE ABSCESS LEFT THIGH;  Surgeon: Trent Frizzle, MD;  Location: [facility];  Service: Urology;  Laterality: Left;   INCISION AND DRAINAGE OF WOUND N/A 12/25/2017   Procedure: Irrigation and debridement of Fournier's of scrotum with placement of testes in subcutaneous thigh pockets and Acell placement;  Surgeon: Thornell Flirt, DO;  Location: [facility];  Service: Plastics;  Laterality: N/A;   INCISION AND DRAINAGE OF WOUND N/A 01/08/2018   Procedure: EXCISION OF GROIN WOUND WITH PLACEMENT OF ACELL, AND PRIMARY WOUND CLOSURE;  Surgeon: Thornell Flirt, DO;  Location: [facility];  Service: Plastics;  Laterality: N/A;   ORCHIECTOMY N/A 12/13/2017   Procedure: EXCISION OF SCROTUM AND DEBRIDEMENT OF PENIS;  Surgeon: Trent Frizzle, MD;  Location: [facility];  Service: Urology;  Laterality: N/A;   ORCHIECTOMY Left 06/22/2018   Procedure: ORCHIECTOMY;  Surgeon: Trent Frizzle, MD;  Location: [facility];  Service: Urology;  Laterality: Left;   PLANTAR FASCIA SURGERY Bilateral    shoulders Bilateral    rotator cuff   SUBMANDIBULAR GLAND EXCISION Left 03/12/2015   Procedure: LEFT SUBMANDIBULAR GLAND RESECTION;  Surgeon: Virgina Grills, MD;  Location: [facility] OR;  Service: ENT;  Laterality: Left;   TONSILLECTOMY      TOTAL HIP ARTHROPLASTY Right 03/06/2019   Procedure: TOTAL HIP ARTHROPLASTY ANTERIOR APPROACH;  Surgeon: Liliane Rei, MD;  Location: [facility];  Service: Orthopedics;  Laterality: Right;     Prior to Admission medications   Medication Sig Start Date End Date Taking? Authorizing Provider  albuterol  (PROVENTIL ) (2.5 MG/3ML) 0.083% nebulizer solution Inhale 2.5 mg into the lungs every 6 (six) hours as needed for shortness of breath or wheezing. 12/06/14  Yes [provider]  albuterol  (VENTOLIN  HFA) 108 (90 Base) MCG/ACT inhaler Inhale 2 puffs into the lungs every 6 (six) hours as needed for wheezing or shortness of breath.   Yes [provider]  aspirin  81 MG EC tablet Take by mouth. 06/02/21  Yes [provider]  atenolol -chlorthalidone  (TENORETIC ) 50-25 MG tablet Take 1 tablet by mouth daily with breakfast.  02/09/15  Yes [provider]  baclofen  (LIORESAL ) 10 MG tablet Take 10 mg by mouth 2 (two) times daily.   Yes [provider]  celecoxib (CELEBREX) 200 MG capsule Take 1 capsule by mouth daily.   Yes [provider]  doxazosin  (CARDURA  XL) 8 MG 24 hr tablet Take 8 mg by mouth daily with breakfast.   Yes [provider]  famotidine  (PEPCID ) 20 MG tablet Take 20 mg by mouth daily. 05/31/19  Yes [provider]  Fexofenadine HCl (ALLERGY 24-HR PO) Take by mouth.   Yes [provider]  furosemide  (LASIX ) 20 MG tablet Take 20 mg by mouth daily after breakfast.    Yes [provider]  gabapentin  (NEURONTIN ) 800 MG tablet Take 800 mg by mouth See admin instructions. Take 1 tablet in the morning and 3 tablets every evening. 02/09/15  Yes [provider]  glipiZIDE  (GLUCOTROL ) 5 MG tablet Take 5 mg by mouth 2 (two) times daily before a meal.   Yes [provider]  metFORMIN  (GLUCOPHAGE ) 500 MG tablet Take 500 mg by mouth 2 (two) times daily with a meal. Take 1/2 taingblet in the morning and  1/2 every evening   Yes [provider]  oxyCODONE -acetaminophen  (PERCOCET) 10-325 MG tablet Take 1 tablet by mouth every 6 (six) hours. 08/12/19  Yes [provider]  pantoprazole  (PROTONIX ) 40 MG tablet Take 40 mg by mouth daily.   Yes [provider]  potassium chloride  (KLOR-CON ) 20 MEQ packet Take 20 mEq by mouth daily. Take 2 tablets every morning   Yes [provider]  rosuvastatin (CRESTOR) 5 MG tablet Take 5 mg by mouth once a week. Every Sunday 07/11/19  Yes [provider]  traZODone  (DESYREL ) 100 MG tablet Take 100 mg by mouth See admin instructions. 2 tablets at bedtime 11/29/17  Yes [provider]  blood glucose meter kit and supplies KIT Dispense based on patient and insurance preference. Use up to four times daily as directed. (FOR ICD-9 250.00, 250.01). 12/28/17   Lavaughn Portland, MD  Insulin  Pen Needle (BD PEN NEEDLE NANO U/F) 32G X 4 MM MISC 1 each by Does not apply route 4 (four) times daily. Patient not taking: Reported on 10/12/2022 06/09/22   Nida, Gebreselassie W, MD    Current Facility-Administered Medications  Medication Dose Route Frequency Provider Last Rate Last Admin   0.9 %  sodium chloride  infusion (Manually program via Guardrails IV Fluids)   Intravenous Once Beatty, Celeste A, PA-C       acetaminophen  (TYLENOL ) tablet 650 mg  650 mg Oral Q6H PRN Emokpae, Ejiroghene E, MD       Or   acetaminophen  (TYLENOL ) suppository 650 mg  650 mg Rectal Q6H PRN Emokpae, Ejiroghene E, MD       albuterol  (PROVENTIL ) (2.5 MG/3ML) 0.083% nebulizer solution 2.5 mg  2.5 mg Inhalation Q6H PRN Emokpae, Ejiroghene E, MD       atenolol  (TENORMIN ) tablet 50 mg  50 mg Oral Daily Kc, Ramesh, MD       baclofen  (LIORESAL ) tablet 10 mg  10 mg Oral BID Emokpae, Ejiroghene E, MD   10 mg at 01/11/24 2229   doxazosin  (CARDURA ) tablet 8 mg  8 mg Oral Q breakfast Emokpae, Ejiroghene E, MD       doxycycline  (VIBRA -TABS) tablet 100 mg  100 mg Oral Q12H  Emokpae, Ejiroghene E, MD   100 mg at 01/11/24 2228   gabapentin  (NEURONTIN ) capsule 2,400 mg  2,400 mg Oral  QHS Meegan, Eryn, RPH   2,400 mg at 01/11/24 2228   gabapentin  (NEURONTIN ) capsule 800 mg  800 mg Oral Q breakfast Meegan, Eryn, RPH       insulin  aspart (novoLOG ) injection 0-5 Units  0-5 Units Subcutaneous QHS Emokpae, Ejiroghene E, MD       insulin  aspart (novoLOG ) injection 0-9 Units  0-9 Units Subcutaneous TID WC Emokpae, Ejiroghene E, MD       ondansetron  (ZOFRAN ) tablet 4 mg  4 mg Oral Q6H PRN Emokpae, Ejiroghene E, MD       Or   ondansetron  (ZOFRAN ) injection 4 mg  4 mg Intravenous Q6H PRN Emokpae, Ejiroghene E, MD       oxyCODONE -acetaminophen  (PERCOCET/ROXICET) 5-325 MG per tablet 1 tablet  1 tablet Oral Q6H PRN Kc, Ramesh, MD       And   oxyCODONE  (Oxy IR/ROXICODONE ) immediate release tablet 5 mg  5 mg Oral Q6H PRN Kc, Ramesh, MD       pantoprazole  (PROTONIX ) injection 40 mg  40 mg Intravenous Q24H Emokpae, Ejiroghene E, MD   40 mg at 01/12/24 0412   polyethylene glycol (MIRALAX  / GLYCOLAX ) packet 17 g  17 g Oral Daily PRN Emokpae, Ejiroghene E, MD       traZODone  (DESYREL ) tablet 100 mg  100 mg Oral QHS Emokpae, Ejiroghene E, MD   100 mg at 01/11/24 2229    Allergies as of 01/11/2024 - Review Complete 01/11/2024  Allergen Reaction Noted   Tramadol  Other (See Comments) 11/11/2016   Adenosine  11/11/2016   Bupropion  11/11/2016   Morphine  and codeine Itching 03/04/2015    Family History  Problem Relation Age of Onset   Lung cancer Mother    Bladder Cancer Father    Allergic rhinitis Neg Hx    Asthma Neg Hx    Eczema Neg Hx    Urticaria Neg Hx     Social History   Socioeconomic History   Marital status: Single    Spouse name: Not on file   Number of children: Not on file   Years of education: Not on file   Highest education level: Not on file  Occupational History   Not on file  Tobacco Use   Smoking status: Former    Current packs/day: 0.00    Average  packs/day: 1 pack/day for 50.0 years (50.0 ttl pk-yrs)    Types: Cigarettes    Start date: 12/14/1966    Quit date: 12/13/2016    Years since quitting: 7.0   Smokeless tobacco: Never  Vaping Use   Vaping status: Never Used  Substance and Sexual Activity   Alcohol use: No    Comment: quit alcohol 80's   Drug use: No   Sexual activity: Not on file  Other Topics Concern   Not on file  Social History Narrative   Not on file   Social Drivers of Health   Financial Resource Strain: Low Risk  (05/09/2022)   [facility] from [facility], [facility]   Overall Financial Resource Strain (CARDIA)    Difficulty of Paying Living Expenses: Not hard at all  Food Insecurity: No Food Insecurity (01/11/2024)   Hunger Vital Sign    Worried About Running Out of Food in the Last Year: Never true    Ran Out of Food in the Last Year: Never true  Transportation Needs: No Transportation Needs (01/11/2024)   PRAPARE - Transportation    Lack of Transportation (Medical): No    Lack  of Transportation (Non-Medical): No  Physical Activity: Not on file  Stress: No Stress Concern Present (05/26/2021)   Received from [facility], [facility] of Occupational Health - Occupational Stress Questionnaire    Feeling of Stress : Not at all  Social Connections: Unknown (01/11/2024)   Social Connection and Isolation Panel [NHANES]    Frequency of Communication with Friends and Family: More than three times a week    Frequency of Social Gatherings with Friends and Family: More than three times a week    Attends Religious Services: Never    Database administrator or Organizations: No    Attends Banker Meetings: Never    Marital Status: Patient declined  Intimate Partner Violence: Not At Risk (01/11/2024)   Humiliation, Afraid, Rape, and Kick questionnaire    Fear of Current or Ex-Partner: No    Emotionally Abused: No    Physically Abused: No    Sexually Abused: No     Review of  Systems   Gen: + fatigue. Denies any fever, chills, loss of appetite, change in weight or weight loss CV: + rib pan. Denies heart palpitations, syncope, edema  Resp: Denies shortness of breath with rest, cough, wheezing, coughing up blood, and pleurisy. GI: see HPI GU : Denies urinary burning, blood in urine, urinary frequency, and urinary incontinence. MS: + paraplegia. Denies swelling, cramps. Derm: + multiple skin wounds, thin skin. Denies rash, itching, dry skin, hives. Psych: Denies depression, anxiety, memory loss, hallucinations, and confusion. Heme: Denies bruising or bleeding Neuro:  + paraplegia. Denies any headaches, dizziness, paresthesias, shaking  Physical Exam   Vital Signs in last 24 hours: Temp:  [97.6 F (36.4 C)-98.8 F (37.1 C)] 98.1 F (36.7 C) (05/02 0341) Pulse Rate:  [56-71] 71 (05/02 0341) Resp:  [13-23] 20 (05/02 0341) BP: (99-137)/(46-96) 112/46 (05/02 0358) SpO2:  [91 %-98 %] 95 % (05/02 0341) FiO2 (%):  [21 %] 21 % (05/01 1936) Weight:  [84 kg-89.2 kg] 89.2 kg (05/01 1809) Last BM Date : 01/11/24  General:   Drowsy,  chronically ill appearing. cooperative in NAD Head:  Normocephalic and atraumatic. Eyes:  Sclera clear, no icterus.   Conjunctiva pink. Ears:  Normal auditory acuity. Mouth:  No deformity or lesions. Poor dentition.  Neck:  Supple; no masses Lungs:  Clear throughout to auscultation.   No wheezes, crackles, or rhonchi. No acute distress. Heart:  Regular rate and rhythm; no murmurs, clicks, rubs,  or gallops. Abdomen:  Soft, nontender and nondistended. No masses, hepatosplenomegaly or hernias noted. Normal bowel sounds, without guarding, and without rebound.   Rectal: deferred   Neurologic:  Drowsy and oriented x4. Skin:  Intact without significant lesions or rashes. Psych:  Drowsy and cooperative. Normal mood and affect.  Intake/Output from previous day: 05/01 0701 - 05/02 0700 In: 890 [I.V.:500; Blood:390] Out: 1925  [Urine:1925] Intake/Output this shift: No intake/output data recorded.  Labs/Studies   Recent Labs Recent Labs    01/11/24 1123 01/12/24 0411  WBC 12.4* 11.4*  HGB 7.1* 7.7*  HCT 25.3* 25.1*  PLT 367 335   BMET Recent Labs    01/11/24 1123 01/12/24 0411  NA 136 137  K 3.1* 3.6  CL 95* 101  CO2 27 26  GLUCOSE 168* 129*  BUN 31* 28*  CREATININE 1.12 1.01  CALCIUM 9.4 8.9   LFT Recent Labs    01/11/24 1123  PROT 7.1  ALBUMIN  3.3*  AST 12*  ALT 13  ALKPHOS 64  BILITOT 0.3   PT/INR No results for input(s): "LABPROT", "INR" in the last 72 hours. Hepatitis Panel No results for input(s): "HEPBSAG", "HCVAB", "HEPAIGM", "HEPBIGM" in the last 72 hours. C-Diff No results for input(s): "CDIFFTOX" in the last 72 hours.  Radiology/Studies CT Chest Wo Contrast Result Date: 01/11/2024 CLINICAL DATA:  Chest trauma, blunt EXAM: CT CHEST WITHOUT CONTRAST TECHNIQUE: Multidetector CT imaging of the chest was performed following the standard protocol without IV contrast. RADIATION DOSE REDUCTION: This exam was performed according to the departmental dose-optimization program which includes automated exposure control, adjustment of the mA and/or kV according to patient size and/or use of iterative reconstruction technique. COMPARISON:  01/11/2024 FINDINGS: Cardiovascular: No cardiomegaly. Trace pericardial effusion. No aortic aneurysm. Multi-vessel coronary atherosclerosis. Diffuse calcified atherosclerosis throughout the aorta. Mediastinum/Nodes: No mediastinal mass. No mediastinal, hilar, or axillary lymphadenopathy. Lungs/Pleura: The midline trachea and bronchi are patent. Confluent centrilobular and paraseptal emphysema within the upper lobes bilaterally. Fibrolinear scarring predominantly in the subpleural regions throughout the lungs. No focal airspace consolidation, pleural effusion, or pneumothorax. Calcified pleural plaque in the posterior right lung base. Musculoskeletal: Minimally  displaced posterolateral right seventh rib fracture. Mildly displaced fractures of the posterolateral right eighth and ninth ribs. Multilevel degenerative disc disease of the spine. Posterior fusion hardware at the cervicothoracic junction. Osteopenia. Subcutaneous edema interdigitating within the right chest wall musculature. Small volume symmetric bilateral gynecomastia. Upper Abdomen: No acute abnormality in the partially visualized upper abdomen. IMPRESSION: 1. Redemonstrated, mildly displaced rib fractures of the right posterolateral 7-9th ribs. No pneumothorax. 2. Confluent centrilobular and paraseptal emphysema. No superimposed pneumonia or pulmonary edema. No pleural effusion. Aortic Atherosclerosis (ICD10-I70.0). Electronically Signed   By: Rance Burrows M.D.   On: 01/11/2024 14:41   DG Ribs Unilateral W/Chest Right Result Date: 01/11/2024 CLINICAL DATA:  pain, injury. EXAM: RIGHT RIBS AND CHEST - 3+ VIEW COMPARISON:  None Available. FINDINGS: Bilateral lung fields are clear. Bilateral costophrenic angles are clear. Normal cardio-mediastinal silhouette. There are mildly displaced fractures of the right posterolateral seventh through ninth ribs. Cervicothoracic spinal fixation hardware noted. Metallic anchors overlying the right proximal humerus, likely from prior rotator cuff repair. The soft tissues are within normal limits. IMPRESSION: Mildly displaced fractures of the right posterolateral seventh through ninth ribs. No pneumothorax or pleural effusion. Electronically Signed   By: Beula Brunswick M.D.   On: 01/11/2024 11:55   Assessment   Cranford Sifuentez Roser is a 73 y.o. year old male with history of COPD, PE, GERD, diastolic dysfunction, anxiety, BPH, diabetes, HTN, HLD, paraplegia c/b multiple pressure injuries who presented to the ED from  endocrinology given hyperglycemia as well as fatigue and right rib pain. Hgb found to be significantly decreased from last check. GI consulted for further  evaluation.  Iron deficiency anemia: No current melena or brbpr, abdominal pain. Has had 5.5g drop in hgb over 17-18 mo time span. Iron panel with low ferritin, iron, and saturation. Hgb improved to 7.7 post 1u PRBC. Is chronically on low dose ASA as well as celebrex (NSAID). Unable to rule out PUD, gastritis, esophagitis, duodenitis, angiectasias, etc. BUN mildly elevated. Would recommend evaluation of upper GI tract given anemia. Would benefit from colonoscopy but given wounds and paraplegia this likely would be difficult to complete outpatient. Could consider inpatient if EGD negative and further decline in Hgb.   Plan / Recommendations   NPO EGD today PPI IV daily Avoid NSAIDs, pending findings may need to consider d/c celebrex In need of colonoscopy,  will determine need for inpt vs outpt pending EGD findings. Would be ideal to complete outpatient.  Further recommendations to follow.      01/12/2024, 9:33 AM  Julian Obey, MSN, FNP-BC, AGACNP-BC [facility]

## 2024-01-12 NOTE — Anesthesia Postprocedure Evaluation (Signed)
 Anesthesia Post Note  Patient: Patrick Aguilar  Procedure(s) Performed: EGD (ESOPHAGOGASTRODUODENOSCOPY)  Patient location during evaluation: Phase II Anesthesia Type: General Level of consciousness: awake and alert Pain management: pain level controlled Vital Signs Assessment: post-procedure vital signs reviewed and stable Respiratory status: spontaneous breathing, nonlabored ventilation and respiratory function stable Cardiovascular status: blood pressure returned to baseline and stable Postop Assessment: no apparent nausea or vomiting Anesthetic complications: no   There were no known notable events for this encounter.   Last Vitals:  Vitals:   01/12/24 1227 01/12/24 1230  BP: (!) 162/85 (!) 162/85  Pulse: 68 64  Resp: 10 (!) 8  Temp: 36.8 C   SpO2: 94% 93%    Last Pain:  Vitals:   01/12/24 1227  TempSrc:   PainSc: Asleep                 Verenis Nicosia L Rodnisha Blomgren

## 2024-01-12 NOTE — Telephone Encounter (Signed)
 Patrick Aguilar - Please set patient up for a hospital follow-up in 4 weeks with myself or Dr. Sammi Crick.  Patrick Aguilar - Needs CBC in 1 week post discharge.   Julian Obey, MSN, APRN, FNP-BC, AGACNP-BC [facility]

## 2024-01-12 NOTE — Transfer of Care (Signed)
 Immediate Anesthesia Transfer of Care Note  Patient: Patrick Aguilar  Procedure(s) Performed: EGD (ESOPHAGOGASTRODUODENOSCOPY)  Patient Location: PACU  Anesthesia Type:General  Level of Consciousness: awake, alert , oriented, and patient cooperative  Airway & Oxygen  Therapy: Patient Spontanous Breathing and Patient connected to nasal cannula oxygen   Post-op Assessment: Report given to RN, Post -op Vital signs reviewed and stable, and Patient moving all extremities X 4  Post vital signs: Reviewed and stable  Last Vitals:  Vitals Value Taken Time  BP 143/79 01/12/24 1147  Temp WNL   Pulse 77 01/12/24 1148  Resp 16 01/12/24 1148  SpO2 95 % 01/12/24 1148  Vitals shown include unfiled device data.  Last Pain:  Vitals:   01/12/24 1119  TempSrc:   PainSc: 10-Worst pain ever         Complications: No notable events documented.

## 2024-01-12 NOTE — Plan of Care (Signed)
 ?  Problem: Education: ?Goal: Knowledge of General Education information will improve ?Description: Including pain rating scale, medication(s)/side effects and non-pharmacologic comfort measures ?Outcome: Progressing ?  ?Problem: Health Behavior/Discharge Planning: ?Goal: Ability to manage health-related needs will improve ?Outcome: Progressing ?  ?Problem: Coping: ?Goal: Level of anxiety will decrease ?Outcome: Progressing ?  ?

## 2024-01-12 NOTE — Anesthesia Preprocedure Evaluation (Signed)
 Anesthesia Evaluation  Patient identified by MRN, date of birth, ID band Patient awake    Reviewed: Allergy & Precautions, H&P , NPO status , Patient's Chart, lab work & pertinent test results, reviewed documented beta blocker date and time   Airway Mallampati: II  TM Distance: >3 FB Neck ROM: full    Dental no notable dental hx. (+) Dental Advisory Given, Teeth Intact   Pulmonary shortness of breath, COPD, former smoker, PE History of respiratory failure   Pulmonary exam normal breath sounds clear to auscultation       Cardiovascular Exercise Tolerance: Good hypertension, pulmonary hypertensionNormal cardiovascular exam Rhythm:regular Rate:Normal  Diastolic dysfunction   Neuro/Psych   Anxiety     paraplegia  negative psych ROS   GI/Hepatic Neg liver ROS,GERD  ,,  Endo/Other  diabetes, Type 2    Renal/GU negative Renal ROS  negative genitourinary   Musculoskeletal  (+) Arthritis , Osteoarthritis,    Abdominal   Peds  Hematology  (+) Blood dyscrasia, anemia Hgb 8.2   Anesthesia Other Findings   Reproductive/Obstetrics negative OB ROS                             Anesthesia Physical Anesthesia Plan  ASA: 4  Anesthesia Plan: General   Post-op Pain Management: Minimal or no pain anticipated   Induction: Intravenous  PONV Risk Score and Plan: Propofol  infusion  Airway Management Planned: Nasal Cannula and Natural Airway  Additional Equipment: None  Intra-op Plan:   Post-operative Plan:   Informed Consent: I have reviewed the patients History and Physical, chart, labs and discussed the procedure including the risks, benefits and alternatives for the proposed anesthesia with the patient or authorized representative who has indicated his/her understanding and acceptance.     Dental Advisory Given  Plan Discussed with: CRNA  Anesthesia Plan Comments:         Anesthesia  Quick Evaluation

## 2024-01-12 NOTE — Op Note (Signed)
 [facility] Patient Name: Patrick Aguilar Procedure Date: 01/12/2024 10:59 AM MRN: [id number] Date of Birth: 1951-06-15 Attending MD: Samantha Cress , , [id number] CSN: [id number] Age: 73 Admit Type: Outpatient Procedure:                Upper GI endoscopy Indications:              Iron deficiency anemia Providers:                Samantha Cress, Crystal Page, [place] Wilson,                            Technician, Theola Fitch Referring MD:              Medicines:                Monitored Anesthesia Care Complications:            No immediate complications. Estimated Blood Loss:     Estimated blood loss: none. Procedure:                Pre-Anesthesia Assessment:                           - Prior to the procedure, a History and Physical                            was performed, and patient medications, allergies                            and sensitivities were reviewed. The patient's                            tolerance of previous anesthesia was reviewed.                           - The risks and benefits of the procedure and the                            sedation options and risks were discussed with the                            patient. All questions were answered and informed                            consent was obtained.                           - ASA Grade Assessment: III - A patient with severe                            systemic disease.                           After obtaining informed consent, the endoscope was                            passed under direct vision. Throughout the  procedure, the patient's blood pressure, pulse, and                            oxygen  saturations were monitored continuously. The                            GIF-H190 ([id number]) scope was introduced through the                            mouth, and advanced to the second part of duodenum.                            The GIF-H190 ([id number]) scope was introduced  through                            the and advanced to the. The upper GI endoscopy was                            accomplished without difficulty. The patient                            tolerated the procedure well. Scope In: 11:27:16 AM Scope Out: 11:40:31 AM Total Procedure Duration: 0 hours 13 minutes 15 seconds  Findings:      The esophagus was normal.      One slowly oozing superficial gastric ulcer was found in the gastric       antrum. The lesion was 4 mm in largest dimension. Area was successfully       injected with 3 mL of a 0.1 mg/mL solution of epinephrine  for       hemostasis. For hemostasis, one hemostatic clip was successfully placed       (MR safe). Clip manufacturer: [employer]. There was no bleeding       at the end of the procedure. Biopsies from the edges of the ulcer were       taken with a cold forceps for histology. There was presence of two other       scars in the antrum. Biopsies from the stomach were taken with a cold       forceps for Helicobacter pylori testing.      Localized mild inflammation characterized by congestion (edema) and       erythema was found in the first portion of the duodenum. Impression:               - Normal esophagus.                           - Oozing gastric ulcer. Injected. Clip                            manufacturer: [employer]. Clip (MR safe) was                            placed. Biopsied.                           - Duodenitis. Moderate Sedation:  Per Anesthesia Care Recommendation:           - Return patient to hospital ward for ongoing care.                           - Full liquids today, advance diet as tolerated                            tomorrow.                           - Use Protonix  (pantoprazole ) 40 mg PO BID.                           - Await pathology results.                           - Daily H/H.                           - No high dose aspirin , meloxicam , celebrex,                             ibuprofen, naproxen , or other non-steroidal                            anti-inflammatory drugs.                           - Return to GI office in 4 weeks. Procedure Code(s):        --- Professional ---                           [phone], 59, Esophagogastroduodenoscopy, flexible,                            transoral; with control of bleeding, any method                           43239, Esophagogastroduodenoscopy, flexible,                            transoral; with biopsy, single or multiple Diagnosis Code(s):        --- Professional ---                           K25.4, Chronic or unspecified gastric ulcer with                            hemorrhage                           K29.80, Duodenitis without bleeding                           D50.9, Iron deficiency anemia, unspecified CPT copyright 2022 American Medical Association. All rights reserved. The codes documented in this report are preliminary and upon coder review  may  be revised to meet current compliance requirements. Samantha Cress, MD Samantha Cress,  01/12/2024 11:53:32 AM This report has been signed electronically. Number of Addenda: 0

## 2024-01-12 NOTE — Progress Notes (Signed)
 PROGRESS NOTE Patrick Aguilar  ZOX:[id number] DOB: 04-Nov-1950 DOA: 01/11/2024 PCP: Theoplis Fix, MD  Brief Narrative/Hospital Course: 47 yom w/ COPD, DM HTN paraplegia referred to the ED from his endocrinology office due to complaints of high blood sugars over the past 2 days with sugars running in the 300s, he also reports fatigue over the past several days, and right rib pain after his caregiver lost his footing and fell on him  2-3 days PTA. He has chronic bilateral lower extremity wounds that he reports are improving followed at wound care center in [place] weekly w/ dressing changes he also has dressing changes twice weekly by his caregivers.  In ED: Vitals fairly stable, labs hypokalemia leukocytosis FOBT negative anemic with hb down to 7.1 from baseline 11-13. CT chest>>mildly displaced right anterior posteriorlateral 7th through 9th ribs fractures.  UA large leukocytes WBC >50, urine culture sent 1 Unit PRBC ordered for transfusion and admission requested.   Subjective: Seen and examined Mildly sleepy but no complaints Overnight afebrile BP 99-100, on room air.  Labs reviewed stable BMP, CBC with mild leukocytosis hemoglobin  up 7.1> 7.7, anemia panel with low ferritin and iron B12 folate normal, FOBT negative.  Assessment and Plan:  Iron deficiency anemia, symptomatic:  Workup shows iron deficiency anemia, FOBT negative, no obvious bleeding.  At home on Celebrex, aspirin  otherwise no anticoagulantsHb baseline 12-13  a year ago now 7.1. s/p 1 u prbc and up at 7.7 Reports his last colonoscopy was about 3 years ago.  GI consulted planning for endoscopic evaluation today .Monitor Recent Labs  Lab 01/11/24 1123 01/12/24 0411 01/12/24 0930  HGB 7.1* 7.7* 8.2*  HCT 25.3* 25.1* 26.7*     Pyuria: Urine culture pending.  Monitor  Hypokalemia: Resolved.   Chronic wound on b/l LE Paraplegia: No signs of cellulitis followed by wound care, wound care consulted.started on a course of  doxycycline  oral 100 twice daily Continue home pain meds/muscle relaxant.  T2DM, without long-term current use of insulin :  Apparently hyperglycemic in 300 at home currently blood sugar well-controlled <200. A1c 7.5 continue SSI holding glipizide  metformin    COPD: Stable.  Continue home inhalers  Essential hypertension, benign BP soft, continue atenolol .  Holding chlorthalidone  and Lasix    DVT prophylaxis: SCDs Start: 01/11/24 1936 Code Status:   Code Status: Full Code Family Communication: plan of care discussed with patient at bedside. Patient status is: Remains hospitalized because of severity of illness Level of care: Telemetry   Dispo: The patient is from: home            Anticipated disposition: TBD.  Pending further GI evaluation Objective: Vitals last 24 hrs: Vitals:   01/12/24 0041 01/12/24 0341 01/12/24 0358 01/12/24 1050  BP: 115/69 (!) 99/52 (!) 112/46 (!) 135/57  Pulse: 71 71  72  Resp: 14 20  13   Temp: 98.8 F (37.1 C) 98.1 F (36.7 C)  99 F (37.2 C)  TempSrc: Oral Oral  Oral  SpO2: 98% 95%  98%  Weight:      Height:        Physical Examination: General exam: alert awake, older than stated age HEENT:Oral mucosa moist, Ear/Nose WNL grossly Respiratory system: B.L clear BS, no use of accessory muscle Cardiovascular system: S1 & S2 +. Gastrointestinal system: Abdomen soft,  NT,ND,BS+ Nervous System: Alert, awake,  following commands. Extremities: LE edema neg, warm extremities Skin: No rashes,warm. MSK: Normal muscle bulk/tone.   Data Reviewed: I have personally reviewed following labs and imaging studies (  see epic result tab) CBC: Recent Labs  Lab 01/11/24 1123 01/12/24 0411 01/12/24 0930  WBC 12.4* 11.4*  --   NEUTROABS 9.7*  --   --   HGB 7.1* 7.7* 8.2*  HCT 25.3* 25.1* 26.7*  MCV 65.9* 66.2*  --   PLT 367 335  --    CMP: Recent Labs  Lab 01/11/24 1123 01/11/24 1447 01/12/24 0411  NA 136  --  137  K 3.1*  --  3.6  CL 95*  --  101   CO2 27  --  26  GLUCOSE 168*  --  129*  BUN 31*  --  28*  CREATININE 1.12  --  1.01  CALCIUM 9.4  --  8.9  MG  --  2.2  --    GFR: Estimated Creatinine Clearance: 74.3 mL/min (by C-G formula based on SCr of 1.01 mg/dL). Recent Labs  Lab 01/11/24 1123  AST 12*  ALT 13  ALKPHOS 64  BILITOT 0.3  PROT 7.1  ALBUMIN  3.3*   No results for input(s): "LIPASE", "AMYLASE" in the last 168 hours. No results for input(s): "AMMONIA" in the last 168 hours. Coagulation Profile: No results for input(s): "INR", "PROTIME" in the last 168 hours. Unresulted Labs (From admission, onward)     Start     Ordered   01/13/24 0500  Basic metabolic panel with GFR  Daily,   R      01/12/24 0749   01/13/24 0500  CBC  Daily,   R      01/12/24 0749   01/12/24 2100  Hemoglobin and hematocrit, blood  Once,   R        01/12/24 0749   01/12/24 1000  Hemoglobin and hematocrit, blood  Now then every 6 hours,   R (with TIMED occurrences)      01/12/24 0749   01/12/24 0743  C Difficile Quick Screen w PCR reflex  (C Difficile quick screen w PCR reflex panel )  Once, for 24 hours,   TIMED       References:    CDiff Information Tool   01/12/24 0742   01/11/24 1434  Urine Culture  Once,   URGENT       Question:  Indication  Answer:  Dysuria   01/11/24 1434           Antimicrobials/Microbiology: Anti-infectives (From admission, onward)    Start     Dose/Rate Route Frequency Ordered Stop   01/11/24 2200  [MAR Hold]  doxycycline  (VIBRA -TABS) tablet 100 mg        (MAR Hold since Fri 01/12/2024 at 1042.Hold Reason: Transfer to a Procedural area)   100 mg Oral Every 12 hours 01/11/24 1935           Component Value Date/Time   SDES  09/07/2022 2226    BLOOD BLOOD LEFT FOREARM Performed at [facility], [address] [place], [place] [address]    SPECREQUEST  09/07/2022 2226    BOTTLES DRAWN AEROBIC AND ANAEROBIC Blood Culture adequate volume Performed at [facility], [address] [place], [place] [address]    CULT  09/07/2022 2226    NO GROWTH 5 DAYS Performed at [facility], [address] [place], [place] [address]    REPTSTATUS 09/13/2022 FINAL 09/07/2022 2226    Procedures: Procedure(s) (LRB): EGD (ESOPHAGOGASTRODUODENOSCOPY) (N/A) Medications reviewed:  Scheduled Meds:  [MAR Hold] sodium chloride    Intravenous Once   [MAR Hold] atenolol   50 mg Oral Daily   [MAR Hold] baclofen   10 mg Oral BID   [MAR Hold] doxazosin   8 mg Oral Q breakfast   [MAR Hold] doxycycline   100 mg Oral Q12H   [MAR Hold] gabapentin   2,400 mg Oral QHS   [MAR Hold] gabapentin   800 mg Oral Q breakfast   [MAR Hold] insulin  aspart  0-5 Units Subcutaneous QHS   [MAR Hold] insulin  aspart  0-9 Units Subcutaneous TID WC   [MAR Hold] pantoprazole  (PROTONIX ) IV  40 mg Intravenous Q24H   [MAR Hold] traZODone   100 mg Oral QHS   Continuous Infusions:  Lesa Rape, MD [facility] 01/12/2024, 11:43 AM

## 2024-01-12 NOTE — Brief Op Note (Signed)
 01/12/2024  11:50 AM  PATIENT:  Patrick Aguilar  73 y.o. male  PRE-OPERATIVE DIAGNOSIS:  iron deficiiency anemia  POST-OPERATIVE DIAGNOSIS:  gastric ulcer; duodenitis; random gastric biopsies; gastric ulcer biopsies; epinephrine  3mL; clip x1  PROCEDURE:  Procedure(s): EGD (ESOPHAGOGASTRODUODENOSCOPY) (N/A)  SURGEON:  Surgeons and Role:    * [place] Garden, MD - Primary  Patient underwent EGD under propofol  sedation.  Tolerated the procedure adequately.   FINDINGS: - Normal esophagus.  - Oozing gastric ulcer.  Injected.  Clip manufacturer: [employer].  Clip (MR safe) was placed.  Biopsied.  - Duodenitis.   RECOMMENDATIONS - Return patient to hospital ward for ongoing care.  - Full liquids today, advance diet as tolerated tomorrow. - Use Protonix  (pantoprazole ) 40 mg PO BID.  - Await pathology results.  - Daily H/H. - No high dose aspirin , meloxicam , celebrex, ibuprofen, naproxen , or other non-steroidal anti-inflammatory drugs.  - Return to GI office in 4 weeks.  - GI service will sign-off, please call us  back if you have any more questions.  Samantha Cress, MD Gastroenterology and Hepatology [facility]

## 2024-01-13 DIAGNOSIS — D649 Anemia, unspecified: Secondary | ICD-10-CM | POA: Diagnosis not present

## 2024-01-13 LAB — CBC
HCT: 27.5 % — ABNORMAL LOW (ref 39.0–52.0)
Hemoglobin: 7.8 g/dL — ABNORMAL LOW (ref 13.0–17.0)
MCH: 19 pg — ABNORMAL LOW (ref 26.0–34.0)
MCHC: 28.4 g/dL — ABNORMAL LOW (ref 30.0–36.0)
MCV: 66.9 fL — ABNORMAL LOW (ref 80.0–100.0)
Platelets: 340 10*3/uL (ref 150–400)
RBC: 4.11 MIL/uL — ABNORMAL LOW (ref 4.22–5.81)
RDW: 21.6 % — ABNORMAL HIGH (ref 11.5–15.5)
WBC: 10.7 10*3/uL — ABNORMAL HIGH (ref 4.0–10.5)
nRBC: 0 % (ref 0.0–0.2)

## 2024-01-13 LAB — BASIC METABOLIC PANEL WITH GFR
Anion gap: 9 (ref 5–15)
BUN: 23 mg/dL (ref 8–23)
CO2: 26 mmol/L (ref 22–32)
Calcium: 8.4 mg/dL — ABNORMAL LOW (ref 8.9–10.3)
Chloride: 106 mmol/L (ref 98–111)
Creatinine, Ser: 0.93 mg/dL (ref 0.61–1.24)
GFR, Estimated: >60 mL/min (ref >60)
Glucose, Bld: 155 mg/dL — ABNORMAL HIGH (ref 70–99)
Potassium: 2.9 mmol/L — ABNORMAL LOW (ref 3.5–5.1)
Sodium: 141 mmol/L (ref 135–145)

## 2024-01-13 LAB — GLUCOSE, CAPILLARY
Glucose-Capillary: 195 mg/dL — ABNORMAL HIGH (ref 70–99)
Glucose-Capillary: 294 mg/dL — ABNORMAL HIGH (ref 70–99)

## 2024-01-13 MED ORDER — ALBUTEROL SULFATE HFA 108 (90 BASE) MCG/ACT IN AERS: 2.0000 | INHALATION_SPRAY | Freq: Four times a day (QID) | RESPIRATORY_TRACT | 3 refills | Status: DC | PRN

## 2024-01-13 MED ORDER — PANTOPRAZOLE SODIUM 40 MG PO TBEC
40.0000 mg | DELAYED_RELEASE_TABLET | Freq: Two times a day (BID) | ORAL | Status: DC
  Administered 2024-01-13: 40 mg via ORAL
  Filled 2024-01-13: qty 1

## 2024-01-13 MED ORDER — PANTOPRAZOLE SODIUM 40 MG PO TBEC: 40.0000 mg | DELAYED_RELEASE_TABLET | Freq: Every day | ORAL | 1 refills | Status: DC

## 2024-01-13 MED ORDER — ATENOLOL 50 MG PO TABS: 50.0000 mg | ORAL_TABLET | Freq: Every day | ORAL | 5 refills | Status: AC

## 2024-01-13 MED ORDER — GABAPENTIN 800 MG PO TABS: 800.0000 mg | ORAL_TABLET | ORAL | 1 refills | Status: AC

## 2024-01-13 MED ORDER — DOXYCYCLINE HYCLATE 100 MG PO TABS: 100.0000 mg | ORAL_TABLET | Freq: Two times a day (BID) | ORAL | 0 refills | Status: AC

## 2024-01-13 MED ORDER — POTASSIUM CHLORIDE CRYS ER 20 MEQ PO TBCR
40.0000 meq | EXTENDED_RELEASE_TABLET | ORAL | Status: DC
  Administered 2024-01-13: 40 meq via ORAL
  Filled 2024-01-13: qty 2

## 2024-01-13 MED ORDER — ACETAMINOPHEN 325 MG PO TABS: 650.0000 mg | ORAL_TABLET | Freq: Four times a day (QID) | ORAL | Status: AC | PRN

## 2024-01-13 MED ORDER — PANTOPRAZOLE SODIUM 40 MG PO TBEC: 40.0000 mg | DELAYED_RELEASE_TABLET | Freq: Two times a day (BID) | ORAL | 2 refills | Status: DC

## 2024-01-13 MED ORDER — ALBUTEROL SULFATE (2.5 MG/3ML) 0.083% IN NEBU: 2.5000 mg | INHALATION_SOLUTION | Freq: Four times a day (QID) | RESPIRATORY_TRACT | 12 refills | Status: DC | PRN

## 2024-01-13 MED ORDER — FERROUS SULFATE 325 (65 FE) MG PO TABS: 325.0000 mg | ORAL_TABLET | Freq: Every day | ORAL | 1 refills | Status: AC

## 2024-01-13 MED ORDER — ASPIRIN 81 MG PO TBEC: 81.0000 mg | DELAYED_RELEASE_TABLET | Freq: Every day | ORAL | 11 refills | Status: AC

## 2024-01-13 NOTE — Discharge Instructions (Signed)
 1)Avoid ibuprofen/Advil/Aleve /Motrin/Goody Powders/Naproxen /BC powders/Meloxicam /Diclofenac /Indomethacin and other Nonsteroidal anti-inflammatory medications as these will make you more likely to bleed and can cause stomach ulcers, can also cause Kidney problems.   2)Repeat CBC and BMP blood test with PCP or with your gastroenterologist Dr. Sammi Crick within a week also  3)Please take Protonix  40 mg twice daily to help your stomach ulcer heal  4)You have Stomach Ulcers so --Please avoid coffee/caffeinated beverages, Tea, chocolate, carbonated drinks/soda, spicy food, Milk, Please Avoid  Alcohol, acidic foods- such as citrus (Lemon, oranges), juices  and tomatoes, Meats with a high fat content. Avoid High-fat condiments and  Fried foods  5)Please Follow up with Gastroenterologist Dr. April Bayard 1 week for repeat CBC blood test and in 4 weeks for recheck and Re-evaluation address [address], [address], [place] [address],,ONGEX Number [phone]

## 2024-01-13 NOTE — Discharge Summary (Signed)
 Patrick Aguilar, is a 73 y.o. male  DOB October 10, 1950  MRN [id number].  Admission date:  01/11/2024  Admitting Physician  Pati Bonine, MD  Discharge Date:  01/13/2024   Primary MD  Theoplis Fix, MD  Recommendations for primary care physician for things to follow:  1)Avoid ibuprofen/Advil/Aleve /Motrin/Goody Powders/Naproxen /BC powders/Meloxicam /Diclofenac /Indomethacin and other Nonsteroidal anti-inflammatory medications as these will make you more likely to bleed and can cause stomach ulcers, can also cause Kidney problems.   2)Repeat CBC and BMP blood test with PCP or with your gastroenterologist Dr. Sammi Crick within a week also  3)Please take Protonix  40 mg twice daily to help your stomach ulcer heal  4)You have Stomach Ulcers so --Please avoid coffee/caffeinated beverages, Tea, chocolate, carbonated drinks/soda, spicy food, Milk, Please Avoid  Alcohol, acidic foods- such as citrus (Lemon, oranges), juices  and tomatoes, Meats with a high fat content. Avoid High-fat condiments and  Fried foods  5)Please Follow up with Gastroenterologist Dr. April Bayard 1 week for repeat CBC blood test and in 4 weeks for recheck and Re-evaluation address [address], [place] [address],,LKGMW Number [phone]     Admission Diagnosis  Acute cystitis without hematuria [N30.00] Closed fracture of multiple ribs of right side, initial encounter [S22.41XA] Anemia, unspecified type [D64.9] Acute anemia [D64.9]   Discharge Diagnosis  Acute cystitis without hematuria [N30.00] Closed fracture of multiple ribs of right side, initial encounter [S22.41XA] Anemia, unspecified type [D64.9] Acute anemia [D64.9]    Principal Problem:   Acute anemia Active Problems:   Chronic wound   Hypokalemia   Essential hypertension, benign   COPD  GOLD  0     Type 2 diabetes mellitus without complication, without  long-term current use of insulin  (HCC)   Chronic gastric ulcer with hemorrhage      Past Medical History:  Diagnosis Date   AKI (acute kidney injury) (HCC) 12/2017   Angio-edema    Anxiety    Arthritis    BPH (benign prostatic hyperplasia)    Complication of anesthesia    low respirations, low BP   COPD (chronic obstructive pulmonary disease) (HCC)    DDD (degenerative disc disease), lumbar    Diabetes mellitus without complication (HCC)    type 2   Diastolic dysfunction 12/14/2017   Moderate noted on ECHO   Fournier's gangrene in male    GERD (gastroesophageal reflux disease)    History of ARDS    History of necrotizing fasciitis    Severe   Hypertension    Lumbar spondylosis    Mixed hyperlipidemia    Numbness and tingling of both upper extremities    Paraplegia (HCC)    January 2023   PE (pulmonary thromboembolism) (HCC)    Pulmonary hypertension (HCC) 12/14/2017   Mild, noted on ECHO   Recurrent upper respiratory infection (URI)    Respiratory failure, acute (HCC) 12/2017   Septic shock (HCC) 12/2017   Shortness of breath dyspnea    Testicular pain, left    Wears glasses  Past Surgical History:  Procedure Laterality Date   ADENOIDECTOMY     APPENDECTOMY     APPLICATION OF A-CELL OF CHEST/ABDOMEN N/A 01/08/2018   Procedure: APPLICATION OF A-CELL OF GROIN;  Surgeon: Thornell Flirt, DO;  Location: [facility];  Service: Plastics;  Laterality: N/A;   APPLICATION OF A-CELL OF EXTREMITY N/A 12/25/2017   Procedure: APPLICATION OF A-CELL;  Surgeon: Thornell Flirt, DO;  Location: [facility];  Service: Plastics;  Laterality: N/A;   BACK SURGERY     x5   CARDIAC CATHETERIZATION  2006   neg   CERVICAL DISC SURGERY     x2   COLONOSCOPY     DEBRIDEMENT AND CLOSURE WOUND N/A 01/26/2018   Procedure: REVISION OF PERINEUM WOUND WITH DEBRIDEMENT, PARTIAL CLOSURE OF PERINEUM, PLACEMENT OF CELLERATE COLLAGEN;  Surgeon: Thornell Flirt, DO;  Location: [facility];   Service: Plastics;  Laterality: N/A;   DENTAL SURGERY     teeth extractions   groin wound  01/08/2018   : EXCISION OF GROIN WOUND WITH PLACEMENT OF ACELL, AND PRIMARY WOUND CLOSURE (N/A Scrotum)   I & D EXTREMITY N/A 03/12/2018   Procedure: IRRIGATION AND DEBRIDEMENT PERIMUM WOUND WITH CLOSURE;  Surgeon: Thornell Flirt, DO;  Location: [facility];  Service: Plastics;  Laterality: N/A;   INCISION AND DRAINAGE ABSCESS Left 07/11/2018   Procedure: INCISION AND DRAINAGE ABSCESS LEFT THIGH;  Surgeon: Trent Frizzle, MD;  [facility]: [facility];  Service: Urology;  Laterality: Left;   INCISION AND DRAINAGE OF WOUND N/A 12/25/2017   Procedure: Irrigation and debridement of Fournier's of scrotum with placement of testes in subcutaneous thigh pockets and Acell placement;  Surgeon: Thornell Flirt, DO;  Location: [facility];  Service: Plastics;  Laterality: N/A;   INCISION AND DRAINAGE OF WOUND N/A 01/08/2018   Procedure: EXCISION OF GROIN WOUND WITH PLACEMENT OF ACELL, AND PRIMARY WOUND CLOSURE;  Surgeon: Thornell Flirt, DO;  Location: [facility];  Service: Plastics;  Laterality: N/A;   ORCHIECTOMY N/A 12/13/2017   Procedure: EXCISION OF SCROTUM AND DEBRIDEMENT OF PENIS;  Surgeon: Trent Frizzle, MD;  [facility];  Service: Urology;  Laterality: N/A;   ORCHIECTOMY Left 06/22/2018   Procedure: ORCHIECTOMY;  Surgeon: Trent Frizzle, MD;  Location: [facility];  Service: Urology;  Laterality: Left;   PLANTAR FASCIA SURGERY Bilateral    shoulders Bilateral    rotator cuff   SUBMANDIBULAR GLAND EXCISION Left 03/12/2015   Procedure: LEFT SUBMANDIBULAR GLAND RESECTION;  Surgeon: Virgina Grills, MD;  Location: [facility] OR;  Service: ENT;  Laterality: Left;   TONSILLECTOMY     TOTAL HIP ARTHROPLASTY Right 03/06/2019   Procedure: TOTAL HIP ARTHROPLASTY ANTERIOR APPROACH;  Surgeon: Liliane Rei, MD;  Location: [facility];  Service: Orthopedics;  Laterality: Right;      HPI  from the  history and physical done on the day of admission:   Chief Complaint: Fatigue, right rib pain   HPI: Patrick Aguilar is a 73 y.o. male with medical history significant for COPD, diabetes mellitus, hypertension, paraplegia.  Patient was referred to the ED from his endocrinology office due to complaints of high blood sugars over the past 2 days with sugars running in the 300s, he also reports fatigue over the past several days, and right rib pain.  He has chronic bilateral lower extremity wounds that he reports are improving.  He follows with wound care center in [place] for weekly dressing changes he also has dressing changes twice  weekly by his caregivers. Patient reports about 2 to 3 days ago, he was rolled over for diaper change by his caregiver when his caregiver lost his footing and fell on him.  He heard his rib pop, and he has subsequently had persistent pain to his right side. Another of his caregiver is at bedside, they deny any black stools or blood in stools.  No vomiting of blood.  No abdominal pain.  No difficulty breathing no chest pain.  Fevers no chills.  He has a chronic condom catheter.   ED Course: Temperature 97.7.  Heart rate 50s to 60s.  Respiratory rate 13-23.  Blood pressure systolic 101-137.  O2 sats greater than 91% on room air. Potassium 3.1.  WBC 12.4.  Stool FOBT negative. Hemoglobin down to 7.1 from baseline 11-13.  Potassium 3.1. CT chest without contrast shows mildly displaced right anterior posteriorlateral 7th through 9th ribs fractures. 1 Unit PRBC ordered for transfusion. Hospitalist to admit for anemia.   Review of Systems: As per HPI all other systems reviewed and negative.   Hospital Course:     17 yom w/ COPD, DM HTN paraplegia referred to the ED from his endocrinology office due to complaints of high blood sugars over the past 2 days with sugars running in the 300s, he also reports fatigue over the past several days, and right rib pain after his  caregiver lost his footing and fell on him  2-3 days PTA. He has chronic bilateral lower extremity wounds that he reports are improving followed at wound care center in [place] weekly w/ dressing changes he also has dressing changes twice weekly by his caregivers.  In ED: Vitals fairly stable, labs hypokalemia leukocytosis FOBT negative anemic with hb down to 7.1 from baseline 11-13. CT chest>>mildly displaced right anterior posteriorlateral 7th through 9th ribs fractures.  UA large leukocytes WBC >50, urine culture sent 1 Unit PRBC ordered for transfusion and admission requested.    Subjective: Eating and drinking well, had BM, no new concerns  Assessment and Plan:  Acute on chronic iron deficiency anemia, symptomatic:  Workup shows iron deficiency anemia, FOBT negative, no obvious bleeding.   At home he was on Celebrex, aspirin  otherwise no anticoagulantsHb baseline 12-13  a year ago now 7.1. s/p 1 u prbc and up at 7.7 Reports his last colonoscopy was about 3 years ago.  GI consulted planning for endoscopic evaluation today .Monitor EGD showed oozing gastric ulcer. Injected. Clipped and Biopsied and also w/ Duodenitis>  -pathology results and follow-up  with GI in 4 weeks  -Avoid NSAIDs -PPI twice daily Recent Labs  Lab 01/11/24 1123 01/12/24 0411 01/12/24 0930 01/12/24 1559 01/13/24 0451  HGB 7.1* 7.7* 8.2* 8.0* 7.8*  HCT 25.3* 25.1* 26.7* 26.3* 27.5*    Pyuria: -Urine culture without growth, patient is asymptomatic  Hypokalemia: Resolved.   Chronic wound on b/l LE Paraplegia: Spinal injury during lumbar neurosurgery No signs of cellulitis followed by wound care, wound care consulted.started on a course of doxycycline  oral 100 twice daily Continue home pain meds/muscle relaxant.  T2DM, without long-term current use of insulin :  -A1c 7.5 reflecting uncontrolled DM with hyperglycemia PTA -Resume PTA diabetic regimen follow-up with PCP for adjustments   COPD: Stable.   Continue home inhalers  Essential hypertension, benign -Resume PTA BP regimen and follow-up with PCP for adjustments  Discharge Condition: stable  Follow UP   Follow-up Information     Urban Garden, MD. Schedule an appointment as soon as possible for  a visit in 4 week(s).   Specialty: Gastroenterology Contact information: [address] [place] [address] [phone]                  Consults obtained -GI  Diet and Activity recommendation:  As advised  Discharge Instructions     Discharge Instructions     Call MD for:  difficulty breathing, headache or visual disturbances   Complete by: As directed    Call MD for:  persistant dizziness or light-headedness   Complete by: As directed    Call MD for:  persistant nausea and vomiting   Complete by: As directed    Call MD for:  temperature >100.4   Complete by: As directed    Diet - low sodium heart healthy   Complete by: As directed    Discharge instructions   Complete by: As directed    1)Avoid ibuprofen/Advil/Aleve /Motrin/Goody Powders/Naproxen /BC powders/Meloxicam /Diclofenac /Indomethacin and other Nonsteroidal anti-inflammatory medications as these will make you more likely to bleed and can cause stomach ulcers, can also cause Kidney problems.   2)Repeat CBC and BMP blood test with PCP or with your gastroenterologist Dr. Sammi Crick within a week also  3)Please take Protonix  40 mg twice daily to help your stomach ulcer heal  4)You have Stomach Ulcers so --Please avoid coffee/caffeinated beverages, Tea, chocolate, carbonated drinks/soda, spicy food, Milk, Please Avoid  Alcohol, acidic foods- such as citrus (Lemon, oranges), juices  and tomatoes, Meats with a high fat content. Avoid High-fat condiments and  Fried foods  5)Please Follow up with Gastroenterologist Dr. April Bayard 1 week for repeat CBC blood test and in 4 weeks for recheck and Re-evaluation address [address],  [address], [place] [address],,MVHQI Number [phone]   Discharge wound care:   Complete by: As directed    As above   Increase activity slowly   Complete by: As directed          Discharge Medications     Allergies as of 01/13/2024       Reactions   Tramadol  Other (See Comments)   tremor   Adenosine    can't tolerate  05/2005   Bupropion    Nausea   Morphine  And Codeine Itching        Medication List     STOP taking these medications    atenolol -chlorthalidone  50-25 MG tablet Commonly known as: TENORETIC    celecoxib 200 MG capsule Commonly known as: CELEBREX   famotidine  20 MG tablet Commonly known as: PEPCID        TAKE these medications    acetaminophen  325 MG tablet Commonly known as: TYLENOL  Take 2 tablets (650 mg total) by mouth every 6 (six) hours as needed for mild pain (pain score 1-3) (or Fever >/= 101).   albuterol  108 (90 Base) MCG/ACT inhaler Commonly known as: VENTOLIN  HFA Inhale 2 puffs into the lungs every 6 (six) hours as needed for wheezing or shortness of breath.   albuterol  (2.5 MG/3ML) 0.083% nebulizer solution Commonly known as: PROVENTIL  Inhale 3 mLs (2.5 mg total) into the lungs every 6 (six) hours as needed for shortness of breath or wheezing.   ALLERGY 24-HR PO Take by mouth.   aspirin  EC 81 MG tablet Take 1 tablet (81 mg total) by mouth daily with breakfast. What changed:  how much to take when to take this   atenolol  50 MG tablet Commonly known as: TENORMIN  Take 1 tablet (50 mg total) by mouth daily. Start taking on: Jan 14, 2024   baclofen  10 MG tablet Commonly known as: LIORESAL  Take 10 mg by mouth 2 (two) times daily.   BD Pen Needle Nano U/F 32G X 4 MM Misc Generic drug: Insulin  Pen Needle 1 each by Does not apply route 4 (four) times daily.   blood glucose meter kit and supplies Kit Dispense based on patient and insurance preference. Use up to four times daily as directed. (FOR ICD-9 250.00, 250.01).    doxazosin  8 MG 24 hr tablet Commonly known as: CARDURA  XL Take 8 mg by mouth daily with breakfast.   doxycycline  100 MG tablet Commonly known as: VIBRA -TABS Take 1 tablet (100 mg total) by mouth 2 (two) times daily for 5 days.   ferrous sulfate 325 (65 FE) MG tablet Take 1 tablet (325 mg total) by mouth daily with breakfast.   furosemide  20 MG tablet Commonly known as: LASIX  Take 20 mg by mouth daily after breakfast.   gabapentin  800 MG tablet Commonly known as: NEURONTIN  Take 1 tablet (800 mg total) by mouth See admin instructions. Take 1 tablet in the morning and 3 tablets every evening.   glipiZIDE  5 MG tablet Commonly known as: GLUCOTROL  Take 5 mg by mouth 2 (two) times daily before a meal.   metFORMIN  500 MG tablet Commonly known as: GLUCOPHAGE  Take 500 mg by mouth 2 (two) times daily with a meal. Take 1/2 taingblet in the morning and 1/2 every evening   oxyCODONE -acetaminophen  10-325 MG tablet Commonly known as: PERCOCET Take 1 tablet by mouth every 6 (six) hours.   pantoprazole  40 MG tablet Commonly known as: Protonix  Take 1 tablet (40 mg total) by mouth daily. What changed: Another medication with the same name was added. Make sure you understand how and when to take each.   pantoprazole  40 MG tablet Commonly known as: Protonix  Take 1 tablet (40 mg total) by mouth 2 (two) times daily. What changed: You were already taking a medication with the same name, and this prescription was added. Make sure you understand how and when to take each.   potassium chloride  20 MEQ packet Commonly known as: KLOR-CON  Take 20 mEq by mouth daily. Take 2 tablets every morning   rosuvastatin 5 MG tablet Commonly known as: CRESTOR Take 5 mg by mouth once a week. Every Sunday   traZODone  100 MG tablet Commonly known as: DESYREL  Take 100 mg by mouth See admin instructions. 2 tablets at bedtime               Discharge Care Instructions  (From admission, onward)            Start     Ordered   01/13/24 0000  Discharge wound care:       Comments: As above   01/13/24 1312            Major procedures and Radiology Reports - PLEASE review detailed and final reports for all details, in brief -   CT Chest Wo Contrast Result Date: 01/11/2024 CLINICAL DATA:  Chest trauma, blunt EXAM: CT CHEST WITHOUT CONTRAST TECHNIQUE: Multidetector CT imaging of the chest was performed following the standard protocol without IV contrast. RADIATION DOSE REDUCTION: This exam was performed according to the departmental dose-optimization program which includes automated exposure control, adjustment of the mA and/or kV according to patient size and/or use of iterative reconstruction technique. COMPARISON:  01/11/2024 FINDINGS: Cardiovascular: No cardiomegaly. Trace pericardial effusion. No aortic aneurysm. Multi-vessel coronary atherosclerosis. Diffuse calcified atherosclerosis throughout the aorta. Mediastinum/Nodes:  No mediastinal mass. No mediastinal, hilar, or axillary lymphadenopathy. Lungs/Pleura: The midline trachea and bronchi are patent. Confluent centrilobular and paraseptal emphysema within the upper lobes bilaterally. Fibrolinear scarring predominantly in the subpleural regions throughout the lungs. No focal airspace consolidation, pleural effusion, or pneumothorax. Calcified pleural plaque in the posterior right lung base. Musculoskeletal: Minimally displaced posterolateral right seventh rib fracture. Mildly displaced fractures of the posterolateral right eighth and ninth ribs. Multilevel degenerative disc disease of the spine. Posterior fusion hardware at the cervicothoracic junction. Osteopenia. Subcutaneous edema interdigitating within the right chest wall musculature. Small volume symmetric bilateral gynecomastia. Upper Abdomen: No acute abnormality in the partially visualized upper abdomen. IMPRESSION: 1. Redemonstrated, mildly displaced rib fractures of the right posterolateral  7-9th ribs. No pneumothorax. 2. Confluent centrilobular and paraseptal emphysema. No superimposed pneumonia or pulmonary edema. No pleural effusion. Aortic Atherosclerosis (ICD10-I70.0). Electronically Signed   By: Rance Burrows M.D.   On: 01/11/2024 14:41   DG Ribs Unilateral W/Chest Right Result Date: 01/11/2024 CLINICAL DATA:  pain, injury. EXAM: RIGHT RIBS AND CHEST - 3+ VIEW COMPARISON:  None Available. FINDINGS: Bilateral lung fields are clear. Bilateral costophrenic angles are clear. Normal cardio-mediastinal silhouette. There are mildly displaced fractures of the right posterolateral seventh through ninth ribs. Cervicothoracic spinal fixation hardware noted. Metallic anchors overlying the right proximal humerus, likely from prior rotator cuff repair. The soft tissues are within normal limits. IMPRESSION: Mildly displaced fractures of the right posterolateral seventh through ninth ribs. No pneumothorax or pleural effusion. Electronically Signed   By: Beula Brunswick M.D.   On: 01/11/2024 11:55    Micro Results   Recent Results (from the past 240 hours)  Urine Culture     Status: Abnormal   Collection Time: 01/11/24  1:15 PM   Specimen: Urine, Clean Catch  Result Value Ref Range Status   Specimen Description   Final    URINE, CLEAN CATCH Performed at [facility], [address] [place], [place] [address]    Special Requests   Final    NONE Performed at [facility], [address] [place], [place] [address]    Culture MULTIPLE SPECIES PRESENT, SUGGEST RECOLLECTION (A)  Final   Report Status 01/12/2024 FINAL  Final    Today   Subjective    Patrick Aguilar today has no new complaints - Eating and drinking well - Had BM        Patient has been seen and examined prior to discharge   Objective   Blood pressure 132/73, pulse 69, temperature 98.2 F (36.8 C), temperature source Oral, resp. rate 16, height 5\' 10"  (1.778 m), weight 89.2 kg, SpO2 97%.   Intake/Output Summary  (Last 24 hours) at 01/13/2024 1316 Last data filed at 01/13/2024 0547 Gross per 24 hour  Intake --  Output 1000 ml  Net -1000 ml    Exam Gen:- Awake Alert, no acute distress  HEENT:- [place].AT, No sclera icterus Neck-Supple Neck,No JVD,.  Lungs-  CTAB , good air movement bilaterally CV- S1, S2 normal, regular Abd-  +ve B.Sounds, Abd Soft, No tenderness,    Extremity/Skin:- No  edema,   good pulses Psych-affect is appropriate, oriented x3 Neuro-paraplegic with chronic neuromuscular deficits no additional new focal deficits, no tremors    Data Review   CBC w Diff:  Lab Results  Component Value Date   WBC 10.7 (H) 01/13/2024   HGB 7.8 (L) 01/13/2024   HCT 27.5 (L) 01/13/2024   HCT 29.2 (L) 12/23/2017   PLT 340 01/13/2024  LYMPHOPCT 11 01/11/2024   BANDSPCT 0 12/14/2017   MONOPCT 6 01/11/2024   EOSPCT 4 01/11/2024   BASOPCT 1 01/11/2024    CMP:  Lab Results  Component Value Date   NA 141 01/13/2024   NA 143 02/22/2023   K 2.9 (L) 01/13/2024   CL 106 01/13/2024   CO2 26 01/13/2024   BUN 23 01/13/2024   BUN 33 (A) 02/22/2023   CREATININE 0.93 01/13/2024   CREATININE 1.13 05/13/2020   GLU 143 02/22/2023   PROT 7.1 01/11/2024   PROT 6.6 04/06/2022   ALBUMIN  3.3 (L) 01/11/2024   ALBUMIN  3.9 04/06/2022   BILITOT 0.3 01/11/2024   BILITOT 0.3 04/06/2022   ALKPHOS 64 01/11/2024   AST 12 (L) 01/11/2024   ALT 13 01/11/2024  .  Total Discharge time is about 33 minutes  Colin Dawley M.D on 01/13/2024 at 1:16 PM  Go to [email or web address] -  for contact info  [facility] - Office  [phone]

## 2024-01-13 NOTE — Plan of Care (Signed)
  Problem: Education: Goal: Knowledge of General Education information will improve Description: Including pain rating scale, medication(s)/side effects and non-pharmacologic comfort measures Outcome: Progressing   Problem: Clinical Measurements: Goal: Ability to maintain clinical measurements within normal limits will improve Outcome: Progressing Goal: Will remain free from infection Outcome: Progressing Goal: Diagnostic test results will improve Outcome: Progressing Goal: Respiratory complications will improve Outcome: Progressing Goal: Cardiovascular complication will be avoided Outcome: Progressing   Problem: Elimination: Goal: Will not experience complications related to bowel motility Outcome: Progressing Goal: Will not experience complications related to urinary retention Outcome: Progressing   Problem: Pain Managment: Goal: General experience of comfort will improve and/or be controlled Outcome: Progressing   Problem: Safety: Goal: Ability to remain free from injury will improve Outcome: Progressing

## 2024-01-15 ENCOUNTER — Encounter (HOSPITAL_COMMUNITY): Payer: Self-pay | Admitting: Gastroenterology

## 2024-01-15 ENCOUNTER — Other Ambulatory Visit: Payer: Self-pay | Admitting: *Deleted

## 2024-01-15 DIAGNOSIS — I119 Hypertensive heart disease without heart failure: Secondary | ICD-10-CM | POA: Diagnosis not present

## 2024-01-15 DIAGNOSIS — G8220 Paraplegia, unspecified: Secondary | ICD-10-CM | POA: Diagnosis not present

## 2024-01-15 DIAGNOSIS — J449 Chronic obstructive pulmonary disease, unspecified: Secondary | ICD-10-CM | POA: Diagnosis not present

## 2024-01-15 DIAGNOSIS — D649 Anemia, unspecified: Secondary | ICD-10-CM

## 2024-01-15 DIAGNOSIS — L89892 Pressure ulcer of other site, stage 2: Secondary | ICD-10-CM | POA: Diagnosis not present

## 2024-01-15 DIAGNOSIS — E1142 Type 2 diabetes mellitus with diabetic polyneuropathy: Secondary | ICD-10-CM | POA: Diagnosis not present

## 2024-01-15 DIAGNOSIS — L89512 Pressure ulcer of right ankle, stage 2: Secondary | ICD-10-CM | POA: Diagnosis not present

## 2024-01-15 LAB — SURGICAL PATHOLOGY

## 2024-01-15 NOTE — Telephone Encounter (Signed)
 Spoke to pt, informed him of recommendations. He voiced understanding

## 2024-01-15 NOTE — Telephone Encounter (Signed)
Labs entered into Epic  °

## 2024-01-16 ENCOUNTER — Encounter (INDEPENDENT_AMBULATORY_CARE_PROVIDER_SITE_OTHER): Payer: Self-pay | Admitting: *Deleted

## 2024-01-17 DIAGNOSIS — S81802A Unspecified open wound, left lower leg, initial encounter: Secondary | ICD-10-CM | POA: Diagnosis not present

## 2024-01-17 DIAGNOSIS — S91002D Unspecified open wound, left ankle, subsequent encounter: Secondary | ICD-10-CM | POA: Diagnosis not present

## 2024-01-17 DIAGNOSIS — X58XXXD Exposure to other specified factors, subsequent encounter: Secondary | ICD-10-CM | POA: Diagnosis not present

## 2024-01-17 DIAGNOSIS — S81801D Unspecified open wound, right lower leg, subsequent encounter: Secondary | ICD-10-CM | POA: Diagnosis not present

## 2024-01-17 DIAGNOSIS — S81801A Unspecified open wound, right lower leg, initial encounter: Secondary | ICD-10-CM | POA: Diagnosis not present

## 2024-01-17 DIAGNOSIS — S81802S Unspecified open wound, left lower leg, sequela: Secondary | ICD-10-CM | POA: Diagnosis not present

## 2024-01-17 DIAGNOSIS — S81801S Unspecified open wound, right lower leg, sequela: Secondary | ICD-10-CM | POA: Diagnosis not present

## 2024-01-17 DIAGNOSIS — S91105D Unspecified open wound of left lesser toe(s) without damage to nail, subsequent encounter: Secondary | ICD-10-CM | POA: Diagnosis not present

## 2024-01-17 DIAGNOSIS — S81802D Unspecified open wound, left lower leg, subsequent encounter: Secondary | ICD-10-CM | POA: Diagnosis not present

## 2024-01-17 DIAGNOSIS — S91102D Unspecified open wound of left great toe without damage to nail, subsequent encounter: Secondary | ICD-10-CM | POA: Diagnosis not present

## 2024-01-19 DIAGNOSIS — L89892 Pressure ulcer of other site, stage 2: Secondary | ICD-10-CM | POA: Diagnosis not present

## 2024-01-19 DIAGNOSIS — J449 Chronic obstructive pulmonary disease, unspecified: Secondary | ICD-10-CM | POA: Diagnosis not present

## 2024-01-19 DIAGNOSIS — I119 Hypertensive heart disease without heart failure: Secondary | ICD-10-CM | POA: Diagnosis not present

## 2024-01-19 DIAGNOSIS — G8220 Paraplegia, unspecified: Secondary | ICD-10-CM | POA: Diagnosis not present

## 2024-01-19 DIAGNOSIS — L89512 Pressure ulcer of right ankle, stage 2: Secondary | ICD-10-CM | POA: Diagnosis not present

## 2024-01-19 DIAGNOSIS — E1142 Type 2 diabetes mellitus with diabetic polyneuropathy: Secondary | ICD-10-CM | POA: Diagnosis not present

## 2024-01-22 DIAGNOSIS — J449 Chronic obstructive pulmonary disease, unspecified: Secondary | ICD-10-CM | POA: Diagnosis not present

## 2024-01-22 DIAGNOSIS — L89512 Pressure ulcer of right ankle, stage 2: Secondary | ICD-10-CM | POA: Diagnosis not present

## 2024-01-22 DIAGNOSIS — G8220 Paraplegia, unspecified: Secondary | ICD-10-CM | POA: Diagnosis not present

## 2024-01-22 DIAGNOSIS — I119 Hypertensive heart disease without heart failure: Secondary | ICD-10-CM | POA: Diagnosis not present

## 2024-01-22 DIAGNOSIS — E1142 Type 2 diabetes mellitus with diabetic polyneuropathy: Secondary | ICD-10-CM | POA: Diagnosis not present

## 2024-01-22 DIAGNOSIS — L89892 Pressure ulcer of other site, stage 2: Secondary | ICD-10-CM | POA: Diagnosis not present

## 2024-01-24 DIAGNOSIS — S91102D Unspecified open wound of left great toe without damage to nail, subsequent encounter: Secondary | ICD-10-CM | POA: Diagnosis not present

## 2024-01-24 DIAGNOSIS — S91002D Unspecified open wound, left ankle, subsequent encounter: Secondary | ICD-10-CM | POA: Diagnosis not present

## 2024-01-24 DIAGNOSIS — S81801D Unspecified open wound, right lower leg, subsequent encounter: Secondary | ICD-10-CM | POA: Diagnosis not present

## 2024-01-24 DIAGNOSIS — S81802A Unspecified open wound, left lower leg, initial encounter: Secondary | ICD-10-CM | POA: Diagnosis not present

## 2024-01-24 DIAGNOSIS — S91105D Unspecified open wound of left lesser toe(s) without damage to nail, subsequent encounter: Secondary | ICD-10-CM | POA: Diagnosis not present

## 2024-01-24 DIAGNOSIS — S81802S Unspecified open wound, left lower leg, sequela: Secondary | ICD-10-CM | POA: Diagnosis not present

## 2024-01-24 DIAGNOSIS — S81801A Unspecified open wound, right lower leg, initial encounter: Secondary | ICD-10-CM | POA: Diagnosis not present

## 2024-01-24 DIAGNOSIS — S81802D Unspecified open wound, left lower leg, subsequent encounter: Secondary | ICD-10-CM | POA: Diagnosis not present

## 2024-01-24 DIAGNOSIS — S81801S Unspecified open wound, right lower leg, sequela: Secondary | ICD-10-CM | POA: Diagnosis not present

## 2024-01-28 DIAGNOSIS — J449 Chronic obstructive pulmonary disease, unspecified: Secondary | ICD-10-CM | POA: Diagnosis not present

## 2024-01-28 DIAGNOSIS — G8220 Paraplegia, unspecified: Secondary | ICD-10-CM | POA: Diagnosis not present

## 2024-01-28 DIAGNOSIS — I119 Hypertensive heart disease without heart failure: Secondary | ICD-10-CM | POA: Diagnosis not present

## 2024-01-28 DIAGNOSIS — L89512 Pressure ulcer of right ankle, stage 2: Secondary | ICD-10-CM | POA: Diagnosis not present

## 2024-01-28 DIAGNOSIS — E1142 Type 2 diabetes mellitus with diabetic polyneuropathy: Secondary | ICD-10-CM | POA: Diagnosis not present

## 2024-01-28 DIAGNOSIS — L89892 Pressure ulcer of other site, stage 2: Secondary | ICD-10-CM | POA: Diagnosis not present

## 2024-01-31 DIAGNOSIS — S91002A Unspecified open wound, left ankle, initial encounter: Secondary | ICD-10-CM | POA: Diagnosis not present

## 2024-01-31 DIAGNOSIS — L89159 Pressure ulcer of sacral region, unspecified stage: Secondary | ICD-10-CM | POA: Diagnosis not present

## 2024-01-31 DIAGNOSIS — B372 Candidiasis of skin and nail: Secondary | ICD-10-CM | POA: Diagnosis not present

## 2024-01-31 DIAGNOSIS — S81801A Unspecified open wound, right lower leg, initial encounter: Secondary | ICD-10-CM | POA: Diagnosis not present

## 2024-01-31 DIAGNOSIS — S81802A Unspecified open wound, left lower leg, initial encounter: Secondary | ICD-10-CM | POA: Diagnosis not present

## 2024-01-31 DIAGNOSIS — S81801D Unspecified open wound, right lower leg, subsequent encounter: Secondary | ICD-10-CM | POA: Diagnosis not present

## 2024-01-31 DIAGNOSIS — S81801S Unspecified open wound, right lower leg, sequela: Secondary | ICD-10-CM | POA: Diagnosis not present

## 2024-01-31 DIAGNOSIS — S81802S Unspecified open wound, left lower leg, sequela: Secondary | ICD-10-CM | POA: Diagnosis not present

## 2024-01-31 DIAGNOSIS — S81802D Unspecified open wound, left lower leg, subsequent encounter: Secondary | ICD-10-CM | POA: Diagnosis not present

## 2024-01-31 DIAGNOSIS — L22 Diaper dermatitis: Secondary | ICD-10-CM | POA: Diagnosis not present

## 2024-02-02 DIAGNOSIS — E1142 Type 2 diabetes mellitus with diabetic polyneuropathy: Secondary | ICD-10-CM | POA: Diagnosis not present

## 2024-02-02 DIAGNOSIS — I119 Hypertensive heart disease without heart failure: Secondary | ICD-10-CM | POA: Diagnosis not present

## 2024-02-02 DIAGNOSIS — L89512 Pressure ulcer of right ankle, stage 2: Secondary | ICD-10-CM | POA: Diagnosis not present

## 2024-02-02 DIAGNOSIS — G8220 Paraplegia, unspecified: Secondary | ICD-10-CM | POA: Diagnosis not present

## 2024-02-02 DIAGNOSIS — J449 Chronic obstructive pulmonary disease, unspecified: Secondary | ICD-10-CM | POA: Diagnosis not present

## 2024-02-02 DIAGNOSIS — L89892 Pressure ulcer of other site, stage 2: Secondary | ICD-10-CM | POA: Diagnosis not present

## 2024-02-05 DIAGNOSIS — L89512 Pressure ulcer of right ankle, stage 2: Secondary | ICD-10-CM | POA: Diagnosis not present

## 2024-02-05 DIAGNOSIS — I119 Hypertensive heart disease without heart failure: Secondary | ICD-10-CM | POA: Diagnosis not present

## 2024-02-05 DIAGNOSIS — J449 Chronic obstructive pulmonary disease, unspecified: Secondary | ICD-10-CM | POA: Diagnosis not present

## 2024-02-05 DIAGNOSIS — L89892 Pressure ulcer of other site, stage 2: Secondary | ICD-10-CM | POA: Diagnosis not present

## 2024-02-05 DIAGNOSIS — E1142 Type 2 diabetes mellitus with diabetic polyneuropathy: Secondary | ICD-10-CM | POA: Diagnosis not present

## 2024-02-05 DIAGNOSIS — G8220 Paraplegia, unspecified: Secondary | ICD-10-CM | POA: Diagnosis not present

## 2024-02-07 DIAGNOSIS — L22 Diaper dermatitis: Secondary | ICD-10-CM | POA: Diagnosis not present

## 2024-02-07 DIAGNOSIS — S81802A Unspecified open wound, left lower leg, initial encounter: Secondary | ICD-10-CM | POA: Diagnosis not present

## 2024-02-07 DIAGNOSIS — S81802S Unspecified open wound, left lower leg, sequela: Secondary | ICD-10-CM | POA: Diagnosis not present

## 2024-02-07 DIAGNOSIS — S81801S Unspecified open wound, right lower leg, sequela: Secondary | ICD-10-CM | POA: Diagnosis not present

## 2024-02-07 DIAGNOSIS — S81802D Unspecified open wound, left lower leg, subsequent encounter: Secondary | ICD-10-CM | POA: Diagnosis not present

## 2024-02-07 DIAGNOSIS — S81801A Unspecified open wound, right lower leg, initial encounter: Secondary | ICD-10-CM | POA: Diagnosis not present

## 2024-02-07 DIAGNOSIS — B372 Candidiasis of skin and nail: Secondary | ICD-10-CM | POA: Diagnosis not present

## 2024-02-07 DIAGNOSIS — L89159 Pressure ulcer of sacral region, unspecified stage: Secondary | ICD-10-CM | POA: Diagnosis not present

## 2024-02-07 DIAGNOSIS — S81801D Unspecified open wound, right lower leg, subsequent encounter: Secondary | ICD-10-CM | POA: Diagnosis not present

## 2024-02-07 DIAGNOSIS — S91002A Unspecified open wound, left ankle, initial encounter: Secondary | ICD-10-CM | POA: Diagnosis not present

## 2024-02-08 ENCOUNTER — Telehealth: Payer: Self-pay | Admitting: Gastroenterology

## 2024-02-08 NOTE — Telephone Encounter (Signed)
 pt was mailed reminder for next appt on June 3rd; mail was sent back to us . Tried to contact pt via phone for the reminder.

## 2024-02-09 DIAGNOSIS — E1142 Type 2 diabetes mellitus with diabetic polyneuropathy: Secondary | ICD-10-CM | POA: Diagnosis not present

## 2024-02-09 DIAGNOSIS — J449 Chronic obstructive pulmonary disease, unspecified: Secondary | ICD-10-CM | POA: Diagnosis not present

## 2024-02-09 DIAGNOSIS — G8220 Paraplegia, unspecified: Secondary | ICD-10-CM | POA: Diagnosis not present

## 2024-02-09 DIAGNOSIS — L89512 Pressure ulcer of right ankle, stage 2: Secondary | ICD-10-CM | POA: Diagnosis not present

## 2024-02-09 DIAGNOSIS — L89892 Pressure ulcer of other site, stage 2: Secondary | ICD-10-CM | POA: Diagnosis not present

## 2024-02-09 DIAGNOSIS — I119 Hypertensive heart disease without heart failure: Secondary | ICD-10-CM | POA: Diagnosis not present

## 2024-02-12 DIAGNOSIS — L89512 Pressure ulcer of right ankle, stage 2: Secondary | ICD-10-CM | POA: Diagnosis not present

## 2024-02-12 DIAGNOSIS — E1142 Type 2 diabetes mellitus with diabetic polyneuropathy: Secondary | ICD-10-CM | POA: Diagnosis not present

## 2024-02-12 DIAGNOSIS — I119 Hypertensive heart disease without heart failure: Secondary | ICD-10-CM | POA: Diagnosis not present

## 2024-02-12 DIAGNOSIS — G8220 Paraplegia, unspecified: Secondary | ICD-10-CM | POA: Diagnosis not present

## 2024-02-12 DIAGNOSIS — J449 Chronic obstructive pulmonary disease, unspecified: Secondary | ICD-10-CM | POA: Diagnosis not present

## 2024-02-12 DIAGNOSIS — L89892 Pressure ulcer of other site, stage 2: Secondary | ICD-10-CM | POA: Diagnosis not present

## 2024-02-13 ENCOUNTER — Encounter: Payer: Self-pay | Admitting: Gastroenterology

## 2024-02-13 ENCOUNTER — Telehealth: Payer: Self-pay | Admitting: Gastroenterology

## 2024-02-13 ENCOUNTER — Ambulatory Visit: Admitting: Gastroenterology

## 2024-02-13 VITALS — BP 122/73 | HR 81 | Temp 98.1°F | Wt 196.0 lb

## 2024-02-13 DIAGNOSIS — D649 Anemia, unspecified: Secondary | ICD-10-CM

## 2024-02-13 DIAGNOSIS — K219 Gastro-esophageal reflux disease without esophagitis: Secondary | ICD-10-CM

## 2024-02-13 DIAGNOSIS — K254 Chronic or unspecified gastric ulcer with hemorrhage: Secondary | ICD-10-CM | POA: Diagnosis not present

## 2024-02-13 DIAGNOSIS — K5900 Constipation, unspecified: Secondary | ICD-10-CM | POA: Diagnosis not present

## 2024-02-13 MED ORDER — PANTOPRAZOLE SODIUM 40 MG PO TBEC: 40.0000 mg | DELAYED_RELEASE_TABLET | Freq: Two times a day (BID) | ORAL | 3 refills | Status: DC

## 2024-02-13 NOTE — Patient Instructions (Addendum)
 Continue iron once daily and pantoprazole  twice daily (before breakfast and supper).  I sent refill on the pantoprazole  to the mail delivery for you today.  Please continue taking your MiraLAX  daily.  If you would like you can add Metamucil to your daily regimen either in your coffee or with water  if you are going to do the powder.  This can help bulk up your stool little bit to help with constipation and help stools pass easier and try to avoid any smearing of stools.  As we discussed it would be beneficial for you to talk with Dr. Bernetta Brilliant about different methods of treatment for pain secondary to your arthritis.  I highly recommend avoiding Celebrex going forward this has most likely contributed to your gastric ulcer.  Also need to continue to avoid ibuprofen, Aleve , Advil.  Please have blood work completed at [facility].  We will call you with results once they have been received. Please allow 3-5 business days for review. 2 locations for [facility] in [place]:              1. [address], [place]              2. [address] [place]   Please also fill out a records request upfront to get your prior colonoscopy records from [facility]).  We will plan to follow-up in 3 months, sooner if needed.  We will be in touch with the results of your lab work once received.  It was a pleasure to see you today. I want to create trusting relationships with patients. If you receive a survey regarding your visit,  I greatly appreciate you taking time to fill this out on paper or through your MyChart. I value your feedback.  Julian Obey, MSN, FNP-BC, AGACNP-BC [facility]

## 2024-02-13 NOTE — Telephone Encounter (Signed)
 Please request prior colonoscopy and/or EGD records with pathology from [facility] and Dr. Louanne Roussel.  Julian Obey, MSN, APRN, FNP-BC, AGACNP-BC [facility] at [facility]

## 2024-02-13 NOTE — Progress Notes (Addendum)
 GI Office Note    Referring Provider: Theoplis Fix, MD Primary Care Physician:  Theoplis Fix, MD Primary Gastroenterologist: Urban Garden, MD  Date:  02/13/2024  ID:  Patrick, Aguilar 04-09-1951, MRN [id number]   Chief Complaint   Chief Complaint  Patient presents with   Follow-up    Hospital follow up. No problems   History of Present Illness  Patrick Aguilar is a 73 y.o. male with a history of COPD, diabetes, HTN, paraplegia c/b multiple pressure injuries presenting today for hospital follow up.   Patient seen during hospitalization 5/2 for significant anemia.  Presented with hemoglobin of 7.1 with MCV 66, low iron saturation of 5% and ferritin of 14.  He had normal B12 and folate.  He was also having right-sided chest pain at the time and was found to have a right sided rib fracture.  He denied any melena or BRBPR at home.  Also denied any coffee-ground emesis, hematemesis, abdominal pain, shortness of breath until caregiver fell on him.  Has had some chronic fatigue and was noted to be on Celebrex and 81 mg aspirin .  He was transfused with a unit of blood and underwent upper endoscopy.  Was started on PPI IV inpatient.  He had previously reported an outpatient colonoscopy was performed at [facility] but was unsure of timing.  EGD 01/12/2024: - Normal esophagus - Oozing gastric ulcer which was injected and clipped s/p biopsy - Duodenitis - Diet was slowly advanced.  Recommended avoiding all NSAIDs. - Pathology negative for H. pylori but with chronic inactive gastritis.  Advise no need for repeat EGD.  Today:  States he had yearly colonscopies in the past due to postieve heme occults. Thinks Dr Louanne Roussel did his last colonoscopy and was possibly about 3 years ago.   Is taking his iron daily with breakfast and has still been tacking his celebrex. States tylenol  does not work for him for pain. Also taking his babay aspirin . No chest pain or shortness of breaht.  Sneezing takes his braht away due to the pain from his rib. No melena or brbpr.   Does have some constipation - is taking miralax  daily and has not had one in 2 days. Took a double dose miralax  this morning. If he does no tog by tonight he will take another double dose tomorrow. He statse previously over the weekend his stools were gettign a little looser so he cut back on the miralax . Eveyrtime he was coughing or sneezing he was having some leakge (this has occurred for years).   He feels well other than his rib pain.   Wt Readings from Last 3 Encounters:  02/13/24 196 lb (88.9 kg)  01/11/24 196 lb 10.4 oz (89.2 kg)  09/07/22 182 lb 15.7 oz (83 kg)    Current Outpatient Medications  Medication Sig Dispense Refill   albuterol  (PROVENTIL ) (2.5 MG/3ML) 0.083% nebulizer solution Inhale 3 mLs (2.5 mg total) into the lungs every 6 (six) hours as needed for shortness of breath or wheezing. 75 mL 12   albuterol  (VENTOLIN  HFA) 108 (90 Base) MCG/ACT inhaler Inhale 2 puffs into the lungs every 6 (six) hours as needed for wheezing or shortness of breath. 8 g 3   aspirin  EC 81 MG tablet Take 1 tablet (81 mg total) by mouth daily with breakfast. 30 tablet 11   atenolol  (TENORMIN ) 50 MG tablet Take 1 tablet (50 mg total) by mouth daily. 30 tablet 5   baclofen  (LIORESAL ) 10 MG tablet  Take 10 mg by mouth 2 (two) times daily.     blood glucose meter kit and supplies KIT Dispense based on patient and insurance preference. Use up to four times daily as directed. (FOR ICD-9 250.00, 250.01). 1 each 0   doxazosin  (CARDURA  XL) 8 MG 24 hr tablet Take 8 mg by mouth daily with breakfast.     ferrous sulfate  325 (65 FE) MG tablet Take 1 tablet (325 mg total) by mouth daily with breakfast. 30 tablet 1   Fexofenadine HCl (ALLERGY 24-HR PO) Take by mouth.     furosemide  (LASIX ) 20 MG tablet Take 20 mg by mouth daily after breakfast.      gabapentin  (NEURONTIN ) 800 MG tablet Take 1 tablet (800 mg total) by mouth See admin  instructions. Take 1 tablet in the morning and 3 tablets every evening. 120 tablet 1   glipiZIDE  (GLUCOTROL ) 5 MG tablet Take 5 mg by mouth 2 (two) times daily before a meal.     metFORMIN  (GLUCOPHAGE ) 500 MG tablet Take 500 mg by mouth 2 (two) times daily with a meal. Take 1/2 taingblet in the morning and 1/2 every evening     oxyCODONE -acetaminophen  (PERCOCET) 10-325 MG tablet Take 1 tablet by mouth every 6 (six) hours.     pantoprazole  (PROTONIX ) 40 MG tablet Take 1 tablet (40 mg total) by mouth daily. 30 tablet 1   potassium chloride  (KLOR-CON ) 20 MEQ packet Take 20 mEq by mouth daily. Take 2 tablets every morning     rosuvastatin (CRESTOR) 5 MG tablet Take 5 mg by mouth once a week. Every Sunday     traZODone  (DESYREL ) 100 MG tablet Take 100 mg by mouth See admin instructions. 2 tablets at bedtime     acetaminophen  (TYLENOL ) 325 MG tablet Take 2 tablets (650 mg total) by mouth every 6 (six) hours as needed for mild pain (pain score 1-3) (or Fever >/= 101).     Insulin  Pen Needle (BD PEN NEEDLE NANO U/F) 32G X 4 MM MISC 1 each by Does not apply route 4 (four) times daily. (Patient not taking: Reported on 10/12/2022) 100 each 2   pantoprazole  (PROTONIX ) 40 MG tablet Take 1 tablet (40 mg total) by mouth 2 (two) times daily. 60 tablet 2   No current facility-administered medications for this visit.    Past Medical History:  Diagnosis Date   AKI (acute kidney injury) (HCC) 12/2017   Angio-edema    Anxiety    Arthritis    BPH (benign prostatic hyperplasia)    Complication of anesthesia    low respirations, low BP   COPD (chronic obstructive pulmonary disease) (HCC)    DDD (degenerative disc disease), lumbar    Diabetes mellitus without complication (HCC)    type 2   Diastolic dysfunction 12/14/2017   Moderate noted on ECHO   Fournier's gangrene in male    GERD (gastroesophageal reflux disease)    History of ARDS    History of necrotizing fasciitis    Severe   Hypertension    Lumbar  spondylosis    Mixed hyperlipidemia    Numbness and tingling of both upper extremities    Paraplegia [facility])    January 2023   PE (pulmonary thromboembolism) (HCC)    Pulmonary hypertension (HCC) 12/14/2017   Mild, noted on ECHO   Recurrent upper respiratory infection (URI)    Respiratory failure, acute (HCC) 12/2017   Septic shock (HCC) 12/2017   Shortness of breath dyspnea    Testicular pain,  left    Wears glasses     Past Surgical History:  Procedure Laterality Date   ADENOIDECTOMY     APPENDECTOMY     APPLICATION OF A-CELL OF CHEST/ABDOMEN N/A 01/08/2018   Procedure: APPLICATION OF A-CELL OF GROIN;  Surgeon: Thornell Flirt, DO;  Location: [facility] OR;  Service: Plastics;  Laterality: N/A;   APPLICATION OF A-CELL OF EXTREMITY N/A 12/25/2017   Procedure: APPLICATION OF A-CELL;  Surgeon: Thornell Flirt, DO;  Location: [facility] ORS;  Service: Plastics;  Laterality: N/A;   BACK SURGERY     x5   CARDIAC CATHETERIZATION  2006   neg   CERVICAL DISC SURGERY     x2   COLONOSCOPY     DEBRIDEMENT AND CLOSURE WOUND N/A 01/26/2018   Procedure: REVISION OF PERINEUM WOUND WITH DEBRIDEMENT, PARTIAL CLOSURE OF PERINEUM, PLACEMENT OF CELLERATE COLLAGEN;  Surgeon: Thornell Flirt, DO;  Location: [facility] OR[facility];  Service: Plastics;  Laterality: N/A;   DENTAL SURGERY     teeth extractions   ESOPHAGOGASTRODUODENOSCOPY N/A 01/12/2024   Procedure: EGD (ESOPHAGOGASTRODUODENOSCOPY);  Surgeon: Umberto Ganong, Bearl Limes, MD;  Location: [facility];  Service: Gastroenterology;  Laterality: N/A;   groin wound  01/08/2018   : EXCISION OF GROIN WOUND WITH PLACEMENT OF ACELL, AND PRIMARY WOUND CLOSURE (N/A Scrotum)   I & D EXTREMITY N/A 03/12/2018   Procedure: IRRIGATION AND DEBRIDEMENT PERIMUM WOUND WITH CLOSURE;  Surgeon: Thornell Flirt, DO;  Location: [facility] OR;  Service: Plastics;  Laterality: N/A;   INCISION AND DRAINAGE ABSCESS Left 07/11/2018   Procedure: INCISION AND DRAINAGE ABSCESS LEFT THIGH;   Surgeon: Trent Frizzle, MD;  Location: [facility];  Service: Urology;  Laterality: Left;   INCISION AND DRAINAGE OF WOUND N/A 12/25/2017   Procedure: Irrigation and debridement of Fournier's of scrotum with placement of testes in subcutaneous thigh pockets and Acell placement;  Surgeon: Thornell Flirt, DO;  Location: [facility];  Service: Plastics;  Laterality: N/A;   INCISION AND DRAINAGE OF WOUND N/A 01/08/2018   Procedure: EXCISION OF GROIN WOUND WITH PLACEMENT OF ACELL, AND PRIMARY WOUND CLOSURE;  Surgeon: Thornell Flirt, DO;  Location: [facility] OR;  Service: Plastics;  Laterality: N/A;   ORCHIECTOMY N/A 12/13/2017   Procedure: EXCISION OF SCROTUM AND DEBRIDEMENT OF PENIS;  Surgeon: Trent Frizzle, MD;  Location: [facility];  Service: Urology;  Laterality: N/A;   ORCHIECTOMY Left 06/22/2018   Procedure: ORCHIECTOMY;  Surgeon: Trent Frizzle, MD;  Location: [facility];  Service: Urology;  Laterality: Left;   PLANTAR FASCIA SURGERY Bilateral    shoulders Bilateral    rotator cuff   SUBMANDIBULAR GLAND EXCISION Left 03/12/2015   Procedure: LEFT SUBMANDIBULAR GLAND RESECTION;  Surgeon: Virgina Grills, MD;  Location: [facility] OR;  Service: ENT;  Laterality: Left;   TONSILLECTOMY     TOTAL HIP ARTHROPLASTY Right 03/06/2019   Procedure: TOTAL HIP ARTHROPLASTY ANTERIOR APPROACH;  Surgeon: Liliane Rei, MD;  Location: [facility];  Service: Orthopedics;  Laterality: Right;     Family History  Problem Relation Age of Onset   Lung cancer Mother    Bladder Cancer Father    Allergic rhinitis Neg Hx    Asthma Neg Hx    Eczema Neg Hx    Urticaria Neg Hx     Allergies as of 02/13/2024 - Review Complete 02/13/2024  Allergen Reaction Noted   Tramadol  Other (See Comments) 11/11/2016   Adenosine  11/11/2016   Bupropion  11/11/2016   Morphine  and  codeine Itching 03/04/2015    Social History   Socioeconomic History   Marital status: Single    Spouse name: Not on file    Number of children: Not on file   Years of education: Not on file   Highest education level: Not on file  Occupational History   Not on file  Tobacco Use   Smoking status: Former    Current packs/day: 0.00    Average packs/day: 1 pack/day for 50.0 years (50.0 ttl pk-yrs)    Types: Cigarettes    Start date: 12/14/1966    Quit date: 12/13/2016    Years since quitting: 7.1   Smokeless tobacco: Never  Vaping Use   Vaping status: Never Used  Substance and Sexual Activity   Alcohol use: No    Comment: quit alcohol 80's   Drug use: No   Sexual activity: Not on file  Other Topics Concern   Not on file  Social History Narrative   Not on file   Social Drivers of Health   Financial Resource Strain: Low Risk  (05/09/2022)   Received [facility]   Overall Financial Resource Strain (CARDIA)    Difficulty of Paying Living Expenses: Not hard at all  Food Insecurity: No Food Insecurity (01/11/2024)   Hunger Vital Sign    Worried About Running Out of Food in the Last Year: Never true    Ran Out of Food in the Last Year: Never true  Transportation Needs: No Transportation Needs (01/11/2024)   PRAPARE - Administrator, Civil Service (Medical): No    Lack of Transportation (Non-Medical): No  Physical Activity: Not on file  Stress: No Stress Concern Present (05/26/2021)   Received from [facility] Occupational [facility] - Occupational Stress Questionnaire    Feeling of Stress : Not at all  Social Connections: Unknown (01/11/2024)   Social Connection and Isolation Panel [NHANES]    Frequency of Communication with Friends and Family: More than three times a week    Frequency of Social Gatherings with Friends and Family: More than three times a week    Attends Religious Services: Never    Database administrator or Organizations: No    Attends Banker Meetings: Never    Marital Status: Patient declined     Review  of Systems   Gen: Denies fever, chills, anorexia. Denies fatigue, weakness, weight loss.  CV: Denies chest pain, palpitations, syncope, peripheral edema, and claudication. Resp: Denies dyspnea at rest, cough, wheezing, coughing up blood, and pleurisy. GI: See HPI Derm: + dry skin. Bilateral lower extremity wounds. Denies rash, itching + rib pain.  Psych: Denies depression, anxiety, memory loss, confusion. No homicidal or suicidal ideation.  Heme: Denies bruising, bleeding, and enlarged lymph nodes.  Physical Exam   BP 122/73 (BP Location: Right Arm, Patient Position: Sitting, Cuff Size: Normal)   Pulse 81   Temp 98.1 F (36.7 C) (Temporal)   Wt 196 lb (88.9 kg)   BMI 28.12 kg/m   General:   Alert and oriented. No distress noted. Pleasant and cooperative.  Head:  Normocephalic and atraumatic. Eyes:  Conjuctiva clear without scleral icterus. Abdomen:  +BS, soft, non-tender and non-distended. No rebound or guarding. No HSM or masses noted. Rectal: deferred Msk:  Symmetrical without gross deformities. Normal posture. Positive spasticity to lower extremities. Extremities: Bilateral leg wraps to cover up bilateral lower extremity wounds. Neurologic:  Alert and  oriented x4.  Psych:  Alert and cooperative. Normal mood and affect.  Assessment  Patrick Aguilar is a 73 y.o. male with a history of COPD, diabetes, HTN, paraplegia c/b multiple pressure injuries presenting today for hospital follow up of anemia.   Anemia: - Most likely cause was oozing gastric ulcer secondary to NSAIDs s/p injection and clipping - Pathology negative for H. Pylori - Presented to the hospital with hemoglobin of 7.1, received 1 unit PRBC with improvement to 7.7. - Has been taking iron daily orally since discharge. - No recheck since discharge, will obtain CBC and iron panel today to assess for improvement - Denies any melena, BRBPR, lack of appetite, early satiety, abdominal pain, dizziness, chest pain,  shortness of breath other than relation to rib pain.  GERD, gastric ulcer: - As noted above, gastric ulcer identified on recent EGD performed on inpatient - Has history of chronic reflux, on pantoprazole  daily at baseline.  Has been taking twice daily since discharge - Will need to continue twice daily PPI for at least 3 months and then will consider reducing back to daily - Has continued Celebrex on discharge from hospital despite prior recommendations to avoid all NSAIDs by our team.  Discussed reasoning behind this today and that he should consider alternative treatment for arthritis and discussed this with his PCP.  Constipation: - Takes MiraLAX  daily for constipation, given some looser stools as of late he has cut back on this.  Since then he has not had a bowel movement 2 days and has doubled up on his MiraLAX . - Discussed adding Metamucil to his regimen to help with some fecal smearing incomplete emptying.  Advised he can take this in conjunction with his MiraLAX  and to let me know if he has any significant diarrhea and/or worsening constipation.  PLAN   CBC, iron panel Consider colonoscopy pending timing of last procedure. Request records from [facility] for last colonoscopy Continue pantoprazole  40 mg twice daily. Refilled to mail delivery.  Continue oral iron daily with breakfast. Continue miralax  daily and add metamucil daily.  Advised cessation of Celebrex and any other NSAIDs. Follow up in 3 months.    Patrick Obey, MSN, FNP-BC, AGACNP-BC [facility]  I have reviewed the note and agree with the APP's assessment as described in this progress note  Samantha Cress, MD Gastroenterology and Hepatology [facility]

## 2024-02-14 ENCOUNTER — Telehealth: Payer: Self-pay | Admitting: Gastroenterology

## 2024-02-14 ENCOUNTER — Encounter: Payer: Self-pay | Admitting: Gastroenterology

## 2024-02-14 DIAGNOSIS — D649 Anemia, unspecified: Secondary | ICD-10-CM | POA: Diagnosis not present

## 2024-02-14 DIAGNOSIS — S91002A Unspecified open wound, left ankle, initial encounter: Secondary | ICD-10-CM | POA: Diagnosis not present

## 2024-02-14 DIAGNOSIS — S81801S Unspecified open wound, right lower leg, sequela: Secondary | ICD-10-CM | POA: Diagnosis not present

## 2024-02-14 DIAGNOSIS — B372 Candidiasis of skin and nail: Secondary | ICD-10-CM | POA: Diagnosis not present

## 2024-02-14 DIAGNOSIS — S81802D Unspecified open wound, left lower leg, subsequent encounter: Secondary | ICD-10-CM | POA: Diagnosis not present

## 2024-02-14 DIAGNOSIS — S81801D Unspecified open wound, right lower leg, subsequent encounter: Secondary | ICD-10-CM | POA: Diagnosis not present

## 2024-02-14 DIAGNOSIS — S81802S Unspecified open wound, left lower leg, sequela: Secondary | ICD-10-CM | POA: Diagnosis not present

## 2024-02-14 DIAGNOSIS — K254 Chronic or unspecified gastric ulcer with hemorrhage: Secondary | ICD-10-CM | POA: Diagnosis not present

## 2024-02-14 DIAGNOSIS — L89159 Pressure ulcer of sacral region, unspecified stage: Secondary | ICD-10-CM | POA: Diagnosis not present

## 2024-02-14 DIAGNOSIS — S81802A Unspecified open wound, left lower leg, initial encounter: Secondary | ICD-10-CM | POA: Diagnosis not present

## 2024-02-14 DIAGNOSIS — S81801A Unspecified open wound, right lower leg, initial encounter: Secondary | ICD-10-CM | POA: Diagnosis not present

## 2024-02-14 DIAGNOSIS — L22 Diaper dermatitis: Secondary | ICD-10-CM | POA: Diagnosis not present

## 2024-02-14 NOTE — Telephone Encounter (Signed)
 Spoke to pt, informed him of recommendations. Pt is agreeable to have procedure.

## 2024-02-14 NOTE — Telephone Encounter (Signed)
 Reviewed procedure records from [facility].  Colonoscopy August 2020: - Redundant sigmoid colon. Manual abdominal counterpressure was used to reach the cecum -Mild nonbleeding diverticulosis sigmoid colon -Normal retroflexion. -Advised repeat colonoscopy in 5 years  Colonoscopy September 2010: - 5 mm sessile rectal polyp -serrated adenoma -Scattered diverticulosis in the left colon -Tortuous and redundant colon found -Observed TI normal  -Normal mucosa on retroflexion -Advised colonoscopy in 10 years with high-fiber diet and Hemoccult x 3 yearly  Need to complete previously ordered blood work however no matter the result, he will be due for colonoscopy in August of this year therefore given his anemia, although likely culprit is recent bleeding stomach ulcer we should proceed with colonoscopy.  If patient is willing please proceed with scheduling him for colonoscopy in August with Dr. Sammi Crick once schedule opens.  He will be ASA 3.  - Will need to hold metformin  and glipizide  night prior to morning of procedure  - Hold iron for 1 week prior  Kyser Wandel S - please relay recommendation to the patient and if wiling forward his response to Mindy/Tammy for scheduling.   Julian Obey, MSN, APRN, FNP-BC, AGACNP-BC [facility] at [facility]

## 2024-02-14 NOTE — Telephone Encounter (Signed)
 Will add to August to schedule once we receive that schedule

## 2024-02-15 ENCOUNTER — Ambulatory Visit: Payer: Self-pay | Admitting: Gastroenterology

## 2024-02-15 ENCOUNTER — Other Ambulatory Visit: Payer: Self-pay | Admitting: *Deleted

## 2024-02-15 DIAGNOSIS — D649 Anemia, unspecified: Secondary | ICD-10-CM

## 2024-02-15 LAB — CBC
Hematocrit: 35.5 % — ABNORMAL LOW (ref 37.5–51.0)
Hemoglobin: 10.4 g/dL — ABNORMAL LOW (ref 13.0–17.7)
MCH: 22.1 pg — ABNORMAL LOW (ref 26.6–33.0)
MCHC: 29.3 g/dL — ABNORMAL LOW (ref 31.5–35.7)
MCV: 76 fL — ABNORMAL LOW (ref 79–97)
Platelets: 280 10*3/uL (ref 150–450)
RBC: 4.7 x10E6/uL (ref 4.14–5.80)
RDW: 25.3 % — ABNORMAL HIGH (ref 11.6–15.4)
WBC: 11.4 10*3/uL — ABNORMAL HIGH (ref 3.4–10.8)

## 2024-02-15 LAB — IRON,TIBC AND FERRITIN PANEL
Ferritin: 37 ng/mL (ref 30–400)
Iron Saturation: 37 % (ref 15–55)
Iron: 97 ug/dL (ref 38–169)
Total Iron Binding Capacity: 265 ug/dL (ref 250–450)
UIBC: 168 ug/dL (ref 111–343)

## 2024-02-16 DIAGNOSIS — E1142 Type 2 diabetes mellitus with diabetic polyneuropathy: Secondary | ICD-10-CM | POA: Diagnosis not present

## 2024-02-16 DIAGNOSIS — L89512 Pressure ulcer of right ankle, stage 2: Secondary | ICD-10-CM | POA: Diagnosis not present

## 2024-02-16 DIAGNOSIS — I119 Hypertensive heart disease without heart failure: Secondary | ICD-10-CM | POA: Diagnosis not present

## 2024-02-16 DIAGNOSIS — L89892 Pressure ulcer of other site, stage 2: Secondary | ICD-10-CM | POA: Diagnosis not present

## 2024-02-16 DIAGNOSIS — G8220 Paraplegia, unspecified: Secondary | ICD-10-CM | POA: Diagnosis not present

## 2024-02-16 DIAGNOSIS — J449 Chronic obstructive pulmonary disease, unspecified: Secondary | ICD-10-CM | POA: Diagnosis not present

## 2024-02-19 DIAGNOSIS — E1142 Type 2 diabetes mellitus with diabetic polyneuropathy: Secondary | ICD-10-CM | POA: Diagnosis not present

## 2024-02-19 DIAGNOSIS — J449 Chronic obstructive pulmonary disease, unspecified: Secondary | ICD-10-CM | POA: Diagnosis not present

## 2024-02-19 DIAGNOSIS — L89512 Pressure ulcer of right ankle, stage 2: Secondary | ICD-10-CM | POA: Diagnosis not present

## 2024-02-19 DIAGNOSIS — G8220 Paraplegia, unspecified: Secondary | ICD-10-CM | POA: Diagnosis not present

## 2024-02-19 DIAGNOSIS — I119 Hypertensive heart disease without heart failure: Secondary | ICD-10-CM | POA: Diagnosis not present

## 2024-02-19 DIAGNOSIS — L89892 Pressure ulcer of other site, stage 2: Secondary | ICD-10-CM | POA: Diagnosis not present

## 2024-02-21 DIAGNOSIS — S81802S Unspecified open wound, left lower leg, sequela: Secondary | ICD-10-CM | POA: Diagnosis not present

## 2024-02-21 DIAGNOSIS — S91002A Unspecified open wound, left ankle, initial encounter: Secondary | ICD-10-CM | POA: Diagnosis not present

## 2024-02-21 DIAGNOSIS — S81802D Unspecified open wound, left lower leg, subsequent encounter: Secondary | ICD-10-CM | POA: Diagnosis not present

## 2024-02-21 DIAGNOSIS — L22 Diaper dermatitis: Secondary | ICD-10-CM | POA: Diagnosis not present

## 2024-02-21 DIAGNOSIS — S81802A Unspecified open wound, left lower leg, initial encounter: Secondary | ICD-10-CM | POA: Diagnosis not present

## 2024-02-21 DIAGNOSIS — B372 Candidiasis of skin and nail: Secondary | ICD-10-CM | POA: Diagnosis not present

## 2024-02-21 DIAGNOSIS — S81801A Unspecified open wound, right lower leg, initial encounter: Secondary | ICD-10-CM | POA: Diagnosis not present

## 2024-02-21 DIAGNOSIS — S81801S Unspecified open wound, right lower leg, sequela: Secondary | ICD-10-CM | POA: Diagnosis not present

## 2024-02-21 DIAGNOSIS — L89159 Pressure ulcer of sacral region, unspecified stage: Secondary | ICD-10-CM | POA: Diagnosis not present

## 2024-02-21 DIAGNOSIS — S81801D Unspecified open wound, right lower leg, subsequent encounter: Secondary | ICD-10-CM | POA: Diagnosis not present

## 2024-02-23 DIAGNOSIS — G8220 Paraplegia, unspecified: Secondary | ICD-10-CM | POA: Diagnosis not present

## 2024-02-23 DIAGNOSIS — E1142 Type 2 diabetes mellitus with diabetic polyneuropathy: Secondary | ICD-10-CM | POA: Diagnosis not present

## 2024-02-23 DIAGNOSIS — J449 Chronic obstructive pulmonary disease, unspecified: Secondary | ICD-10-CM | POA: Diagnosis not present

## 2024-02-23 DIAGNOSIS — I119 Hypertensive heart disease without heart failure: Secondary | ICD-10-CM | POA: Diagnosis not present

## 2024-02-23 DIAGNOSIS — L89892 Pressure ulcer of other site, stage 2: Secondary | ICD-10-CM | POA: Diagnosis not present

## 2024-02-23 DIAGNOSIS — L89512 Pressure ulcer of right ankle, stage 2: Secondary | ICD-10-CM | POA: Diagnosis not present

## 2024-02-26 DIAGNOSIS — L89512 Pressure ulcer of right ankle, stage 2: Secondary | ICD-10-CM | POA: Diagnosis not present

## 2024-02-26 DIAGNOSIS — E1142 Type 2 diabetes mellitus with diabetic polyneuropathy: Secondary | ICD-10-CM | POA: Diagnosis not present

## 2024-02-26 DIAGNOSIS — I119 Hypertensive heart disease without heart failure: Secondary | ICD-10-CM | POA: Diagnosis not present

## 2024-02-26 DIAGNOSIS — J449 Chronic obstructive pulmonary disease, unspecified: Secondary | ICD-10-CM | POA: Diagnosis not present

## 2024-02-26 DIAGNOSIS — G8220 Paraplegia, unspecified: Secondary | ICD-10-CM | POA: Diagnosis not present

## 2024-02-26 DIAGNOSIS — L89892 Pressure ulcer of other site, stage 2: Secondary | ICD-10-CM | POA: Diagnosis not present

## 2024-02-28 DIAGNOSIS — B372 Candidiasis of skin and nail: Secondary | ICD-10-CM | POA: Diagnosis not present

## 2024-02-28 DIAGNOSIS — S81802D Unspecified open wound, left lower leg, subsequent encounter: Secondary | ICD-10-CM | POA: Diagnosis not present

## 2024-02-28 DIAGNOSIS — S81801S Unspecified open wound, right lower leg, sequela: Secondary | ICD-10-CM | POA: Diagnosis not present

## 2024-02-28 DIAGNOSIS — L89159 Pressure ulcer of sacral region, unspecified stage: Secondary | ICD-10-CM | POA: Diagnosis not present

## 2024-02-28 DIAGNOSIS — S81801A Unspecified open wound, right lower leg, initial encounter: Secondary | ICD-10-CM | POA: Diagnosis not present

## 2024-02-28 DIAGNOSIS — S81802S Unspecified open wound, left lower leg, sequela: Secondary | ICD-10-CM | POA: Diagnosis not present

## 2024-02-28 DIAGNOSIS — S81802A Unspecified open wound, left lower leg, initial encounter: Secondary | ICD-10-CM | POA: Diagnosis not present

## 2024-02-28 DIAGNOSIS — S81801D Unspecified open wound, right lower leg, subsequent encounter: Secondary | ICD-10-CM | POA: Diagnosis not present

## 2024-02-28 DIAGNOSIS — L89899 Pressure ulcer of other site, unspecified stage: Secondary | ICD-10-CM | POA: Diagnosis not present

## 2024-02-28 DIAGNOSIS — L22 Diaper dermatitis: Secondary | ICD-10-CM | POA: Diagnosis not present

## 2024-02-28 DIAGNOSIS — S91002A Unspecified open wound, left ankle, initial encounter: Secondary | ICD-10-CM | POA: Diagnosis not present

## 2024-03-01 DIAGNOSIS — G8220 Paraplegia, unspecified: Secondary | ICD-10-CM | POA: Diagnosis not present

## 2024-03-01 DIAGNOSIS — E1142 Type 2 diabetes mellitus with diabetic polyneuropathy: Secondary | ICD-10-CM | POA: Diagnosis not present

## 2024-03-01 DIAGNOSIS — L89512 Pressure ulcer of right ankle, stage 2: Secondary | ICD-10-CM | POA: Diagnosis not present

## 2024-03-01 DIAGNOSIS — L89892 Pressure ulcer of other site, stage 2: Secondary | ICD-10-CM | POA: Diagnosis not present

## 2024-03-01 DIAGNOSIS — I119 Hypertensive heart disease without heart failure: Secondary | ICD-10-CM | POA: Diagnosis not present

## 2024-03-01 DIAGNOSIS — J449 Chronic obstructive pulmonary disease, unspecified: Secondary | ICD-10-CM | POA: Diagnosis not present

## 2024-03-04 DIAGNOSIS — I119 Hypertensive heart disease without heart failure: Secondary | ICD-10-CM | POA: Diagnosis not present

## 2024-03-04 DIAGNOSIS — L89512 Pressure ulcer of right ankle, stage 2: Secondary | ICD-10-CM | POA: Diagnosis not present

## 2024-03-04 DIAGNOSIS — L89892 Pressure ulcer of other site, stage 2: Secondary | ICD-10-CM | POA: Diagnosis not present

## 2024-03-04 DIAGNOSIS — E1142 Type 2 diabetes mellitus with diabetic polyneuropathy: Secondary | ICD-10-CM | POA: Diagnosis not present

## 2024-03-04 DIAGNOSIS — G8220 Paraplegia, unspecified: Secondary | ICD-10-CM | POA: Diagnosis not present

## 2024-03-04 DIAGNOSIS — J449 Chronic obstructive pulmonary disease, unspecified: Secondary | ICD-10-CM | POA: Diagnosis not present

## 2024-03-06 DIAGNOSIS — S81801S Unspecified open wound, right lower leg, sequela: Secondary | ICD-10-CM | POA: Diagnosis not present

## 2024-03-06 DIAGNOSIS — M869 Osteomyelitis, unspecified: Secondary | ICD-10-CM | POA: Diagnosis not present

## 2024-03-06 DIAGNOSIS — J449 Chronic obstructive pulmonary disease, unspecified: Secondary | ICD-10-CM | POA: Diagnosis not present

## 2024-03-06 DIAGNOSIS — K254 Chronic or unspecified gastric ulcer with hemorrhage: Secondary | ICD-10-CM | POA: Diagnosis not present

## 2024-03-06 DIAGNOSIS — Z86711 Personal history of pulmonary embolism: Secondary | ICD-10-CM | POA: Diagnosis not present

## 2024-03-06 DIAGNOSIS — S91302D Unspecified open wound, left foot, subsequent encounter: Secondary | ICD-10-CM | POA: Diagnosis not present

## 2024-03-06 DIAGNOSIS — E1169 Type 2 diabetes mellitus with other specified complication: Secondary | ICD-10-CM | POA: Diagnosis not present

## 2024-03-06 DIAGNOSIS — L22 Diaper dermatitis: Secondary | ICD-10-CM | POA: Diagnosis not present

## 2024-03-06 DIAGNOSIS — Z87891 Personal history of nicotine dependence: Secondary | ICD-10-CM | POA: Diagnosis not present

## 2024-03-06 DIAGNOSIS — L97329 Non-pressure chronic ulcer of left ankle with unspecified severity: Secondary | ICD-10-CM | POA: Diagnosis not present

## 2024-03-06 DIAGNOSIS — Z7722 Contact with and (suspected) exposure to environmental tobacco smoke (acute) (chronic): Secondary | ICD-10-CM | POA: Diagnosis not present

## 2024-03-06 DIAGNOSIS — S81802A Unspecified open wound, left lower leg, initial encounter: Secondary | ICD-10-CM | POA: Diagnosis not present

## 2024-03-06 DIAGNOSIS — G8220 Paraplegia, unspecified: Secondary | ICD-10-CM | POA: Diagnosis not present

## 2024-03-06 DIAGNOSIS — M1991 Primary osteoarthritis, unspecified site: Secondary | ICD-10-CM | POA: Diagnosis not present

## 2024-03-06 DIAGNOSIS — Z981 Arthrodesis status: Secondary | ICD-10-CM | POA: Diagnosis not present

## 2024-03-06 DIAGNOSIS — K2100 Gastro-esophageal reflux disease with esophagitis, without bleeding: Secondary | ICD-10-CM | POA: Diagnosis not present

## 2024-03-06 DIAGNOSIS — E1142 Type 2 diabetes mellitus with diabetic polyneuropathy: Secondary | ICD-10-CM | POA: Diagnosis not present

## 2024-03-06 DIAGNOSIS — S81802S Unspecified open wound, left lower leg, sequela: Secondary | ICD-10-CM | POA: Diagnosis not present

## 2024-03-06 DIAGNOSIS — X58XXXA Exposure to other specified factors, initial encounter: Secondary | ICD-10-CM | POA: Diagnosis not present

## 2024-03-06 DIAGNOSIS — Z66 Do not resuscitate: Secondary | ICD-10-CM | POA: Diagnosis not present

## 2024-03-06 DIAGNOSIS — K2980 Duodenitis without bleeding: Secondary | ICD-10-CM | POA: Diagnosis not present

## 2024-03-06 DIAGNOSIS — I10 Essential (primary) hypertension: Secondary | ICD-10-CM | POA: Diagnosis not present

## 2024-03-06 DIAGNOSIS — E11622 Type 2 diabetes mellitus with other skin ulcer: Secondary | ICD-10-CM | POA: Diagnosis not present

## 2024-03-06 DIAGNOSIS — S81801A Unspecified open wound, right lower leg, initial encounter: Secondary | ICD-10-CM | POA: Diagnosis not present

## 2024-03-06 DIAGNOSIS — D509 Iron deficiency anemia, unspecified: Secondary | ICD-10-CM | POA: Diagnosis not present

## 2024-03-06 DIAGNOSIS — E7800 Pure hypercholesterolemia, unspecified: Secondary | ICD-10-CM | POA: Diagnosis not present

## 2024-03-08 DIAGNOSIS — G8220 Paraplegia, unspecified: Secondary | ICD-10-CM | POA: Diagnosis not present

## 2024-03-08 DIAGNOSIS — I119 Hypertensive heart disease without heart failure: Secondary | ICD-10-CM | POA: Diagnosis not present

## 2024-03-08 DIAGNOSIS — L89512 Pressure ulcer of right ankle, stage 2: Secondary | ICD-10-CM | POA: Diagnosis not present

## 2024-03-08 DIAGNOSIS — J449 Chronic obstructive pulmonary disease, unspecified: Secondary | ICD-10-CM | POA: Diagnosis not present

## 2024-03-08 DIAGNOSIS — E1142 Type 2 diabetes mellitus with diabetic polyneuropathy: Secondary | ICD-10-CM | POA: Diagnosis not present

## 2024-03-08 DIAGNOSIS — L89892 Pressure ulcer of other site, stage 2: Secondary | ICD-10-CM | POA: Diagnosis not present

## 2024-03-11 DIAGNOSIS — J449 Chronic obstructive pulmonary disease, unspecified: Secondary | ICD-10-CM | POA: Diagnosis not present

## 2024-03-11 DIAGNOSIS — G8220 Paraplegia, unspecified: Secondary | ICD-10-CM | POA: Diagnosis not present

## 2024-03-11 DIAGNOSIS — I119 Hypertensive heart disease without heart failure: Secondary | ICD-10-CM | POA: Diagnosis not present

## 2024-03-11 DIAGNOSIS — L89892 Pressure ulcer of other site, stage 2: Secondary | ICD-10-CM | POA: Diagnosis not present

## 2024-03-11 DIAGNOSIS — L89512 Pressure ulcer of right ankle, stage 2: Secondary | ICD-10-CM | POA: Diagnosis not present

## 2024-03-11 DIAGNOSIS — E1142 Type 2 diabetes mellitus with diabetic polyneuropathy: Secondary | ICD-10-CM | POA: Diagnosis not present

## 2024-03-13 DIAGNOSIS — D509 Iron deficiency anemia, unspecified: Secondary | ICD-10-CM | POA: Diagnosis not present

## 2024-03-13 DIAGNOSIS — Z66 Do not resuscitate: Secondary | ICD-10-CM | POA: Diagnosis not present

## 2024-03-13 DIAGNOSIS — S81802S Unspecified open wound, left lower leg, sequela: Secondary | ICD-10-CM | POA: Diagnosis not present

## 2024-03-13 DIAGNOSIS — Z86711 Personal history of pulmonary embolism: Secondary | ICD-10-CM | POA: Diagnosis not present

## 2024-03-13 DIAGNOSIS — E1142 Type 2 diabetes mellitus with diabetic polyneuropathy: Secondary | ICD-10-CM | POA: Diagnosis not present

## 2024-03-13 DIAGNOSIS — K2980 Duodenitis without bleeding: Secondary | ICD-10-CM | POA: Diagnosis not present

## 2024-03-13 DIAGNOSIS — M1991 Primary osteoarthritis, unspecified site: Secondary | ICD-10-CM | POA: Diagnosis not present

## 2024-03-13 DIAGNOSIS — K254 Chronic or unspecified gastric ulcer with hemorrhage: Secondary | ICD-10-CM | POA: Diagnosis not present

## 2024-03-13 DIAGNOSIS — I10 Essential (primary) hypertension: Secondary | ICD-10-CM | POA: Diagnosis not present

## 2024-03-13 DIAGNOSIS — L97329 Non-pressure chronic ulcer of left ankle with unspecified severity: Secondary | ICD-10-CM | POA: Diagnosis not present

## 2024-03-13 DIAGNOSIS — S91002D Unspecified open wound, left ankle, subsequent encounter: Secondary | ICD-10-CM | POA: Diagnosis not present

## 2024-03-13 DIAGNOSIS — S81801S Unspecified open wound, right lower leg, sequela: Secondary | ICD-10-CM | POA: Diagnosis not present

## 2024-03-13 DIAGNOSIS — L22 Diaper dermatitis: Secondary | ICD-10-CM | POA: Diagnosis not present

## 2024-03-13 DIAGNOSIS — G8220 Paraplegia, unspecified: Secondary | ICD-10-CM | POA: Diagnosis not present

## 2024-03-13 DIAGNOSIS — J449 Chronic obstructive pulmonary disease, unspecified: Secondary | ICD-10-CM | POA: Diagnosis not present

## 2024-03-13 DIAGNOSIS — Z981 Arthrodesis status: Secondary | ICD-10-CM | POA: Diagnosis not present

## 2024-03-13 DIAGNOSIS — E11622 Type 2 diabetes mellitus with other skin ulcer: Secondary | ICD-10-CM | POA: Diagnosis not present

## 2024-03-13 DIAGNOSIS — S91302D Unspecified open wound, left foot, subsequent encounter: Secondary | ICD-10-CM | POA: Diagnosis not present

## 2024-03-13 DIAGNOSIS — Z87891 Personal history of nicotine dependence: Secondary | ICD-10-CM | POA: Diagnosis not present

## 2024-03-13 DIAGNOSIS — E7800 Pure hypercholesterolemia, unspecified: Secondary | ICD-10-CM | POA: Diagnosis not present

## 2024-03-13 DIAGNOSIS — M869 Osteomyelitis, unspecified: Secondary | ICD-10-CM | POA: Diagnosis not present

## 2024-03-13 DIAGNOSIS — L89899 Pressure ulcer of other site, unspecified stage: Secondary | ICD-10-CM | POA: Diagnosis not present

## 2024-03-13 DIAGNOSIS — S81801D Unspecified open wound, right lower leg, subsequent encounter: Secondary | ICD-10-CM | POA: Diagnosis not present

## 2024-03-13 DIAGNOSIS — K2100 Gastro-esophageal reflux disease with esophagitis, without bleeding: Secondary | ICD-10-CM | POA: Diagnosis not present

## 2024-03-13 DIAGNOSIS — Z7722 Contact with and (suspected) exposure to environmental tobacco smoke (acute) (chronic): Secondary | ICD-10-CM | POA: Diagnosis not present

## 2024-03-13 DIAGNOSIS — X58XXXD Exposure to other specified factors, subsequent encounter: Secondary | ICD-10-CM | POA: Diagnosis not present

## 2024-03-13 DIAGNOSIS — E1169 Type 2 diabetes mellitus with other specified complication: Secondary | ICD-10-CM | POA: Diagnosis not present

## 2024-03-14 DIAGNOSIS — E1142 Type 2 diabetes mellitus with diabetic polyneuropathy: Secondary | ICD-10-CM | POA: Diagnosis not present

## 2024-03-14 DIAGNOSIS — G8220 Paraplegia, unspecified: Secondary | ICD-10-CM | POA: Diagnosis not present

## 2024-03-14 DIAGNOSIS — L89892 Pressure ulcer of other site, stage 2: Secondary | ICD-10-CM | POA: Diagnosis not present

## 2024-03-14 DIAGNOSIS — I119 Hypertensive heart disease without heart failure: Secondary | ICD-10-CM | POA: Diagnosis not present

## 2024-03-14 DIAGNOSIS — J449 Chronic obstructive pulmonary disease, unspecified: Secondary | ICD-10-CM | POA: Diagnosis not present

## 2024-03-14 DIAGNOSIS — L89512 Pressure ulcer of right ankle, stage 2: Secondary | ICD-10-CM | POA: Diagnosis not present

## 2024-03-18 DIAGNOSIS — E1142 Type 2 diabetes mellitus with diabetic polyneuropathy: Secondary | ICD-10-CM | POA: Diagnosis not present

## 2024-03-18 DIAGNOSIS — I119 Hypertensive heart disease without heart failure: Secondary | ICD-10-CM | POA: Diagnosis not present

## 2024-03-18 DIAGNOSIS — J449 Chronic obstructive pulmonary disease, unspecified: Secondary | ICD-10-CM | POA: Diagnosis not present

## 2024-03-18 DIAGNOSIS — L89512 Pressure ulcer of right ankle, stage 2: Secondary | ICD-10-CM | POA: Diagnosis not present

## 2024-03-18 DIAGNOSIS — G8220 Paraplegia, unspecified: Secondary | ICD-10-CM | POA: Diagnosis not present

## 2024-03-18 DIAGNOSIS — L89892 Pressure ulcer of other site, stage 2: Secondary | ICD-10-CM | POA: Diagnosis not present

## 2024-03-20 DIAGNOSIS — L97329 Non-pressure chronic ulcer of left ankle with unspecified severity: Secondary | ICD-10-CM | POA: Diagnosis not present

## 2024-03-20 DIAGNOSIS — I10 Essential (primary) hypertension: Secondary | ICD-10-CM | POA: Diagnosis not present

## 2024-03-20 DIAGNOSIS — L22 Diaper dermatitis: Secondary | ICD-10-CM | POA: Diagnosis not present

## 2024-03-20 DIAGNOSIS — K2100 Gastro-esophageal reflux disease with esophagitis, without bleeding: Secondary | ICD-10-CM | POA: Diagnosis not present

## 2024-03-20 DIAGNOSIS — J449 Chronic obstructive pulmonary disease, unspecified: Secondary | ICD-10-CM | POA: Diagnosis not present

## 2024-03-20 DIAGNOSIS — E11622 Type 2 diabetes mellitus with other skin ulcer: Secondary | ICD-10-CM | POA: Diagnosis not present

## 2024-03-20 DIAGNOSIS — Z981 Arthrodesis status: Secondary | ICD-10-CM | POA: Diagnosis not present

## 2024-03-20 DIAGNOSIS — E7800 Pure hypercholesterolemia, unspecified: Secondary | ICD-10-CM | POA: Diagnosis not present

## 2024-03-20 DIAGNOSIS — S81801S Unspecified open wound, right lower leg, sequela: Secondary | ICD-10-CM | POA: Diagnosis not present

## 2024-03-20 DIAGNOSIS — Z86711 Personal history of pulmonary embolism: Secondary | ICD-10-CM | POA: Diagnosis not present

## 2024-03-20 DIAGNOSIS — K254 Chronic or unspecified gastric ulcer with hemorrhage: Secondary | ICD-10-CM | POA: Diagnosis not present

## 2024-03-20 DIAGNOSIS — D509 Iron deficiency anemia, unspecified: Secondary | ICD-10-CM | POA: Diagnosis not present

## 2024-03-20 DIAGNOSIS — Z66 Do not resuscitate: Secondary | ICD-10-CM | POA: Diagnosis not present

## 2024-03-20 DIAGNOSIS — Z7722 Contact with and (suspected) exposure to environmental tobacco smoke (acute) (chronic): Secondary | ICD-10-CM | POA: Diagnosis not present

## 2024-03-20 DIAGNOSIS — S91302D Unspecified open wound, left foot, subsequent encounter: Secondary | ICD-10-CM | POA: Diagnosis not present

## 2024-03-20 DIAGNOSIS — G8220 Paraplegia, unspecified: Secondary | ICD-10-CM | POA: Diagnosis not present

## 2024-03-20 DIAGNOSIS — E1142 Type 2 diabetes mellitus with diabetic polyneuropathy: Secondary | ICD-10-CM | POA: Diagnosis not present

## 2024-03-20 DIAGNOSIS — K2980 Duodenitis without bleeding: Secondary | ICD-10-CM | POA: Diagnosis not present

## 2024-03-20 DIAGNOSIS — M19072 Primary osteoarthritis, left ankle and foot: Secondary | ICD-10-CM | POA: Diagnosis not present

## 2024-03-20 DIAGNOSIS — Z87891 Personal history of nicotine dependence: Secondary | ICD-10-CM | POA: Diagnosis not present

## 2024-03-20 DIAGNOSIS — M869 Osteomyelitis, unspecified: Secondary | ICD-10-CM | POA: Diagnosis not present

## 2024-03-20 DIAGNOSIS — S81802A Unspecified open wound, left lower leg, initial encounter: Secondary | ICD-10-CM | POA: Diagnosis not present

## 2024-03-20 DIAGNOSIS — S81801A Unspecified open wound, right lower leg, initial encounter: Secondary | ICD-10-CM | POA: Diagnosis not present

## 2024-03-20 DIAGNOSIS — M1991 Primary osteoarthritis, unspecified site: Secondary | ICD-10-CM | POA: Diagnosis not present

## 2024-03-20 DIAGNOSIS — S81802S Unspecified open wound, left lower leg, sequela: Secondary | ICD-10-CM | POA: Diagnosis not present

## 2024-03-20 DIAGNOSIS — E1169 Type 2 diabetes mellitus with other specified complication: Secondary | ICD-10-CM | POA: Diagnosis not present

## 2024-03-21 NOTE — Telephone Encounter (Signed)
Called pt, no answer and VM full

## 2024-03-22 DIAGNOSIS — E1142 Type 2 diabetes mellitus with diabetic polyneuropathy: Secondary | ICD-10-CM | POA: Diagnosis not present

## 2024-03-22 DIAGNOSIS — I119 Hypertensive heart disease without heart failure: Secondary | ICD-10-CM | POA: Diagnosis not present

## 2024-03-22 DIAGNOSIS — G8220 Paraplegia, unspecified: Secondary | ICD-10-CM | POA: Diagnosis not present

## 2024-03-22 DIAGNOSIS — J449 Chronic obstructive pulmonary disease, unspecified: Secondary | ICD-10-CM | POA: Diagnosis not present

## 2024-03-22 DIAGNOSIS — L89892 Pressure ulcer of other site, stage 2: Secondary | ICD-10-CM | POA: Diagnosis not present

## 2024-03-22 DIAGNOSIS — L89512 Pressure ulcer of right ankle, stage 2: Secondary | ICD-10-CM | POA: Diagnosis not present

## 2024-03-25 DIAGNOSIS — L89512 Pressure ulcer of right ankle, stage 2: Secondary | ICD-10-CM | POA: Diagnosis not present

## 2024-03-25 DIAGNOSIS — E1142 Type 2 diabetes mellitus with diabetic polyneuropathy: Secondary | ICD-10-CM | POA: Diagnosis not present

## 2024-03-25 DIAGNOSIS — G8220 Paraplegia, unspecified: Secondary | ICD-10-CM | POA: Diagnosis not present

## 2024-03-25 DIAGNOSIS — L89892 Pressure ulcer of other site, stage 2: Secondary | ICD-10-CM | POA: Diagnosis not present

## 2024-03-25 DIAGNOSIS — J449 Chronic obstructive pulmonary disease, unspecified: Secondary | ICD-10-CM | POA: Diagnosis not present

## 2024-03-25 DIAGNOSIS — I119 Hypertensive heart disease without heart failure: Secondary | ICD-10-CM | POA: Diagnosis not present

## 2024-03-26 DIAGNOSIS — G8220 Paraplegia, unspecified: Secondary | ICD-10-CM | POA: Diagnosis not present

## 2024-03-26 DIAGNOSIS — J449 Chronic obstructive pulmonary disease, unspecified: Secondary | ICD-10-CM | POA: Diagnosis not present

## 2024-03-26 DIAGNOSIS — Z1389 Encounter for screening for other disorder: Secondary | ICD-10-CM | POA: Diagnosis not present

## 2024-03-26 DIAGNOSIS — E785 Hyperlipidemia, unspecified: Secondary | ICD-10-CM | POA: Diagnosis not present

## 2024-03-26 DIAGNOSIS — Z299 Encounter for prophylactic measures, unspecified: Secondary | ICD-10-CM | POA: Diagnosis not present

## 2024-03-26 DIAGNOSIS — Z7189 Other specified counseling: Secondary | ICD-10-CM | POA: Diagnosis not present

## 2024-03-26 DIAGNOSIS — Z0000 Encounter for general adult medical examination without abnormal findings: Secondary | ICD-10-CM | POA: Diagnosis not present

## 2024-03-26 DIAGNOSIS — Z79899 Other long term (current) drug therapy: Secondary | ICD-10-CM | POA: Diagnosis not present

## 2024-03-26 DIAGNOSIS — R5383 Other fatigue: Secondary | ICD-10-CM | POA: Diagnosis not present

## 2024-03-26 DIAGNOSIS — I10 Essential (primary) hypertension: Secondary | ICD-10-CM | POA: Diagnosis not present

## 2024-03-26 LAB — COMPREHENSIVE METABOLIC PANEL WITH GFR
Albumin: 3.8 (ref 3.5–5.0)
Calcium: 9.3 (ref 8.7–10.7)
EGFR: 61
Globulin: 3

## 2024-03-26 LAB — BASIC METABOLIC PANEL WITH GFR
BUN: 29 — AB (ref 4–21)
CO2: 24 — AB (ref 13–22)
Chloride: 98 — AB (ref 99–108)
Creatinine: 1.3 (ref 0.6–1.3)
Glucose: 141
Potassium: 3.6 meq/L (ref 3.5–5.1)
Sodium: 143 (ref 137–147)

## 2024-03-26 LAB — LIPID PANEL
Cholesterol: 145 (ref 0–200)
HDL: 48 (ref 35–70)
LDL Cholesterol: 75
Triglycerides: 124 (ref 40–160)

## 2024-03-26 LAB — HEPATIC FUNCTION PANEL
ALT: 9 U/L — AB (ref 10–40)
AST: 12 — AB (ref 14–40)
Alkaline Phosphatase: 83 (ref 25–125)
Bilirubin, Total: 0.2

## 2024-03-26 LAB — TSH: TSH: 4.83 (ref 0.41–5.90)

## 2024-03-27 DIAGNOSIS — L89229 Pressure ulcer of left hip, unspecified stage: Secondary | ICD-10-CM | POA: Diagnosis not present

## 2024-03-27 DIAGNOSIS — E7800 Pure hypercholesterolemia, unspecified: Secondary | ICD-10-CM | POA: Diagnosis not present

## 2024-03-27 DIAGNOSIS — S81802D Unspecified open wound, left lower leg, subsequent encounter: Secondary | ICD-10-CM | POA: Diagnosis not present

## 2024-03-27 DIAGNOSIS — E1169 Type 2 diabetes mellitus with other specified complication: Secondary | ICD-10-CM | POA: Diagnosis not present

## 2024-03-27 DIAGNOSIS — L22 Diaper dermatitis: Secondary | ICD-10-CM | POA: Diagnosis not present

## 2024-03-27 DIAGNOSIS — Z7722 Contact with and (suspected) exposure to environmental tobacco smoke (acute) (chronic): Secondary | ICD-10-CM | POA: Diagnosis not present

## 2024-03-27 DIAGNOSIS — Z7984 Long term (current) use of oral hypoglycemic drugs: Secondary | ICD-10-CM | POA: Diagnosis not present

## 2024-03-27 DIAGNOSIS — L97329 Non-pressure chronic ulcer of left ankle with unspecified severity: Secondary | ICD-10-CM | POA: Diagnosis not present

## 2024-03-27 DIAGNOSIS — E11622 Type 2 diabetes mellitus with other skin ulcer: Secondary | ICD-10-CM | POA: Diagnosis not present

## 2024-03-27 DIAGNOSIS — S91001D Unspecified open wound, right ankle, subsequent encounter: Secondary | ICD-10-CM | POA: Diagnosis not present

## 2024-03-27 DIAGNOSIS — J449 Chronic obstructive pulmonary disease, unspecified: Secondary | ICD-10-CM | POA: Diagnosis not present

## 2024-03-27 DIAGNOSIS — K254 Chronic or unspecified gastric ulcer with hemorrhage: Secondary | ICD-10-CM | POA: Diagnosis not present

## 2024-03-27 DIAGNOSIS — M1991 Primary osteoarthritis, unspecified site: Secondary | ICD-10-CM | POA: Diagnosis not present

## 2024-03-27 DIAGNOSIS — Z86711 Personal history of pulmonary embolism: Secondary | ICD-10-CM | POA: Diagnosis not present

## 2024-03-27 DIAGNOSIS — X58XXXD Exposure to other specified factors, subsequent encounter: Secondary | ICD-10-CM | POA: Diagnosis not present

## 2024-03-27 DIAGNOSIS — S81801S Unspecified open wound, right lower leg, sequela: Secondary | ICD-10-CM | POA: Diagnosis not present

## 2024-03-27 DIAGNOSIS — Z981 Arthrodesis status: Secondary | ICD-10-CM | POA: Diagnosis not present

## 2024-03-27 DIAGNOSIS — S91302D Unspecified open wound, left foot, subsequent encounter: Secondary | ICD-10-CM | POA: Diagnosis not present

## 2024-03-27 DIAGNOSIS — Z87891 Personal history of nicotine dependence: Secondary | ICD-10-CM | POA: Diagnosis not present

## 2024-03-27 DIAGNOSIS — G8220 Paraplegia, unspecified: Secondary | ICD-10-CM | POA: Diagnosis not present

## 2024-03-27 DIAGNOSIS — E1142 Type 2 diabetes mellitus with diabetic polyneuropathy: Secondary | ICD-10-CM | POA: Diagnosis not present

## 2024-03-27 DIAGNOSIS — S91002D Unspecified open wound, left ankle, subsequent encounter: Secondary | ICD-10-CM | POA: Diagnosis not present

## 2024-03-27 DIAGNOSIS — M85872 Other specified disorders of bone density and structure, left ankle and foot: Secondary | ICD-10-CM | POA: Diagnosis not present

## 2024-03-27 DIAGNOSIS — S81802S Unspecified open wound, left lower leg, sequela: Secondary | ICD-10-CM | POA: Diagnosis not present

## 2024-03-27 DIAGNOSIS — M869 Osteomyelitis, unspecified: Secondary | ICD-10-CM | POA: Diagnosis not present

## 2024-03-27 DIAGNOSIS — S71002A Unspecified open wound, left hip, initial encounter: Secondary | ICD-10-CM | POA: Diagnosis not present

## 2024-03-27 DIAGNOSIS — D509 Iron deficiency anemia, unspecified: Secondary | ICD-10-CM | POA: Diagnosis not present

## 2024-03-27 DIAGNOSIS — Z66 Do not resuscitate: Secondary | ICD-10-CM | POA: Diagnosis not present

## 2024-03-27 DIAGNOSIS — K2100 Gastro-esophageal reflux disease with esophagitis, without bleeding: Secondary | ICD-10-CM | POA: Diagnosis not present

## 2024-03-27 DIAGNOSIS — Z885 Allergy status to narcotic agent status: Secondary | ICD-10-CM | POA: Diagnosis not present

## 2024-03-27 DIAGNOSIS — Z79899 Other long term (current) drug therapy: Secondary | ICD-10-CM | POA: Diagnosis not present

## 2024-03-27 DIAGNOSIS — S81801D Unspecified open wound, right lower leg, subsequent encounter: Secondary | ICD-10-CM | POA: Diagnosis not present

## 2024-03-27 DIAGNOSIS — M19072 Primary osteoarthritis, left ankle and foot: Secondary | ICD-10-CM | POA: Diagnosis not present

## 2024-03-27 DIAGNOSIS — I10 Essential (primary) hypertension: Secondary | ICD-10-CM | POA: Diagnosis not present

## 2024-03-27 DIAGNOSIS — K2980 Duodenitis without bleeding: Secondary | ICD-10-CM | POA: Diagnosis not present

## 2024-03-29 DIAGNOSIS — G8220 Paraplegia, unspecified: Secondary | ICD-10-CM | POA: Diagnosis not present

## 2024-03-29 DIAGNOSIS — L89892 Pressure ulcer of other site, stage 2: Secondary | ICD-10-CM | POA: Diagnosis not present

## 2024-03-29 DIAGNOSIS — J449 Chronic obstructive pulmonary disease, unspecified: Secondary | ICD-10-CM | POA: Diagnosis not present

## 2024-03-29 DIAGNOSIS — E1142 Type 2 diabetes mellitus with diabetic polyneuropathy: Secondary | ICD-10-CM | POA: Diagnosis not present

## 2024-03-29 DIAGNOSIS — I119 Hypertensive heart disease without heart failure: Secondary | ICD-10-CM | POA: Diagnosis not present

## 2024-03-29 DIAGNOSIS — L89512 Pressure ulcer of right ankle, stage 2: Secondary | ICD-10-CM | POA: Diagnosis not present

## 2024-04-01 DIAGNOSIS — I119 Hypertensive heart disease without heart failure: Secondary | ICD-10-CM | POA: Diagnosis not present

## 2024-04-01 DIAGNOSIS — G8220 Paraplegia, unspecified: Secondary | ICD-10-CM | POA: Diagnosis not present

## 2024-04-01 DIAGNOSIS — L89512 Pressure ulcer of right ankle, stage 2: Secondary | ICD-10-CM | POA: Diagnosis not present

## 2024-04-01 DIAGNOSIS — L89892 Pressure ulcer of other site, stage 2: Secondary | ICD-10-CM | POA: Diagnosis not present

## 2024-04-01 DIAGNOSIS — J449 Chronic obstructive pulmonary disease, unspecified: Secondary | ICD-10-CM | POA: Diagnosis not present

## 2024-04-01 DIAGNOSIS — E1142 Type 2 diabetes mellitus with diabetic polyneuropathy: Secondary | ICD-10-CM | POA: Diagnosis not present

## 2024-04-03 DIAGNOSIS — S81801S Unspecified open wound, right lower leg, sequela: Secondary | ICD-10-CM | POA: Diagnosis not present

## 2024-04-03 DIAGNOSIS — S91302D Unspecified open wound, left foot, subsequent encounter: Secondary | ICD-10-CM | POA: Diagnosis not present

## 2024-04-03 DIAGNOSIS — M48062 Spinal stenosis, lumbar region with neurogenic claudication: Secondary | ICD-10-CM | POA: Diagnosis not present

## 2024-04-03 DIAGNOSIS — Z5181 Encounter for therapeutic drug level monitoring: Secondary | ICD-10-CM | POA: Diagnosis not present

## 2024-04-03 DIAGNOSIS — S91002S Unspecified open wound, left ankle, sequela: Secondary | ICD-10-CM | POA: Diagnosis not present

## 2024-04-03 DIAGNOSIS — S81802S Unspecified open wound, left lower leg, sequela: Secondary | ICD-10-CM | POA: Diagnosis not present

## 2024-04-03 DIAGNOSIS — G9520 Unspecified cord compression: Secondary | ICD-10-CM | POA: Diagnosis not present

## 2024-04-03 DIAGNOSIS — M4714 Other spondylosis with myelopathy, thoracic region: Secondary | ICD-10-CM | POA: Diagnosis not present

## 2024-04-03 DIAGNOSIS — X58XXXS Exposure to other specified factors, sequela: Secondary | ICD-10-CM | POA: Diagnosis not present

## 2024-04-03 DIAGNOSIS — G894 Chronic pain syndrome: Secondary | ICD-10-CM | POA: Diagnosis not present

## 2024-04-03 DIAGNOSIS — Z79891 Long term (current) use of opiate analgesic: Secondary | ICD-10-CM | POA: Diagnosis not present

## 2024-04-03 NOTE — Telephone Encounter (Signed)
 Called pt, line rang numerous times, no answer and not able to leave VM. Letter mailed

## 2024-04-05 DIAGNOSIS — E1142 Type 2 diabetes mellitus with diabetic polyneuropathy: Secondary | ICD-10-CM | POA: Diagnosis not present

## 2024-04-05 DIAGNOSIS — G8220 Paraplegia, unspecified: Secondary | ICD-10-CM | POA: Diagnosis not present

## 2024-04-05 DIAGNOSIS — J449 Chronic obstructive pulmonary disease, unspecified: Secondary | ICD-10-CM | POA: Diagnosis not present

## 2024-04-05 DIAGNOSIS — I119 Hypertensive heart disease without heart failure: Secondary | ICD-10-CM | POA: Diagnosis not present

## 2024-04-05 DIAGNOSIS — L89512 Pressure ulcer of right ankle, stage 2: Secondary | ICD-10-CM | POA: Diagnosis not present

## 2024-04-05 DIAGNOSIS — L89892 Pressure ulcer of other site, stage 2: Secondary | ICD-10-CM | POA: Diagnosis not present

## 2024-04-08 DIAGNOSIS — I119 Hypertensive heart disease without heart failure: Secondary | ICD-10-CM | POA: Diagnosis not present

## 2024-04-08 DIAGNOSIS — L89892 Pressure ulcer of other site, stage 2: Secondary | ICD-10-CM | POA: Diagnosis not present

## 2024-04-08 DIAGNOSIS — S91302D Unspecified open wound, left foot, subsequent encounter: Secondary | ICD-10-CM | POA: Diagnosis not present

## 2024-04-08 DIAGNOSIS — M659 Unspecified synovitis and tenosynovitis, unspecified site: Secondary | ICD-10-CM | POA: Diagnosis not present

## 2024-04-08 DIAGNOSIS — E1142 Type 2 diabetes mellitus with diabetic polyneuropathy: Secondary | ICD-10-CM | POA: Diagnosis not present

## 2024-04-08 DIAGNOSIS — L89512 Pressure ulcer of right ankle, stage 2: Secondary | ICD-10-CM | POA: Diagnosis not present

## 2024-04-08 DIAGNOSIS — M8618 Other acute osteomyelitis, other site: Secondary | ICD-10-CM | POA: Diagnosis not present

## 2024-04-08 DIAGNOSIS — J449 Chronic obstructive pulmonary disease, unspecified: Secondary | ICD-10-CM | POA: Diagnosis not present

## 2024-04-08 DIAGNOSIS — M86172 Other acute osteomyelitis, left ankle and foot: Secondary | ICD-10-CM | POA: Diagnosis not present

## 2024-04-08 DIAGNOSIS — G8220 Paraplegia, unspecified: Secondary | ICD-10-CM | POA: Diagnosis not present

## 2024-04-10 DIAGNOSIS — S81801D Unspecified open wound, right lower leg, subsequent encounter: Secondary | ICD-10-CM | POA: Diagnosis not present

## 2024-04-10 DIAGNOSIS — S91302D Unspecified open wound, left foot, subsequent encounter: Secondary | ICD-10-CM | POA: Diagnosis not present

## 2024-04-10 DIAGNOSIS — X58XXXD Exposure to other specified factors, subsequent encounter: Secondary | ICD-10-CM | POA: Diagnosis not present

## 2024-04-10 DIAGNOSIS — S81801S Unspecified open wound, right lower leg, sequela: Secondary | ICD-10-CM | POA: Diagnosis not present

## 2024-04-10 DIAGNOSIS — S81802A Unspecified open wound, left lower leg, initial encounter: Secondary | ICD-10-CM | POA: Diagnosis not present

## 2024-04-10 DIAGNOSIS — M86072 Acute hematogenous osteomyelitis, left ankle and foot: Secondary | ICD-10-CM | POA: Diagnosis not present

## 2024-04-10 DIAGNOSIS — S81802S Unspecified open wound, left lower leg, sequela: Secondary | ICD-10-CM | POA: Diagnosis not present

## 2024-04-10 DIAGNOSIS — X58XXXS Exposure to other specified factors, sequela: Secondary | ICD-10-CM | POA: Diagnosis not present

## 2024-04-10 DIAGNOSIS — S81801A Unspecified open wound, right lower leg, initial encounter: Secondary | ICD-10-CM | POA: Diagnosis not present

## 2024-04-12 DIAGNOSIS — E1142 Type 2 diabetes mellitus with diabetic polyneuropathy: Secondary | ICD-10-CM | POA: Diagnosis not present

## 2024-04-12 DIAGNOSIS — I119 Hypertensive heart disease without heart failure: Secondary | ICD-10-CM | POA: Diagnosis not present

## 2024-04-12 DIAGNOSIS — G8220 Paraplegia, unspecified: Secondary | ICD-10-CM | POA: Diagnosis not present

## 2024-04-12 DIAGNOSIS — L89892 Pressure ulcer of other site, stage 2: Secondary | ICD-10-CM | POA: Diagnosis not present

## 2024-04-12 DIAGNOSIS — J449 Chronic obstructive pulmonary disease, unspecified: Secondary | ICD-10-CM | POA: Diagnosis not present

## 2024-04-12 DIAGNOSIS — L89512 Pressure ulcer of right ankle, stage 2: Secondary | ICD-10-CM | POA: Diagnosis not present

## 2024-04-15 ENCOUNTER — Telehealth: Payer: Self-pay | Admitting: *Deleted

## 2024-04-15 DIAGNOSIS — L89512 Pressure ulcer of right ankle, stage 2: Secondary | ICD-10-CM | POA: Diagnosis not present

## 2024-04-15 DIAGNOSIS — E1142 Type 2 diabetes mellitus with diabetic polyneuropathy: Secondary | ICD-10-CM | POA: Diagnosis not present

## 2024-04-15 DIAGNOSIS — G8220 Paraplegia, unspecified: Secondary | ICD-10-CM | POA: Diagnosis not present

## 2024-04-15 DIAGNOSIS — J449 Chronic obstructive pulmonary disease, unspecified: Secondary | ICD-10-CM | POA: Diagnosis not present

## 2024-04-15 DIAGNOSIS — L89892 Pressure ulcer of other site, stage 2: Secondary | ICD-10-CM | POA: Diagnosis not present

## 2024-04-15 DIAGNOSIS — I119 Hypertensive heart disease without heart failure: Secondary | ICD-10-CM | POA: Diagnosis not present

## 2024-04-15 NOTE — Telephone Encounter (Signed)
 Attempted to contact pt to schedule for procedure. Someone picked up phone but did not say anything. Spoke to Melinda ( pt's daughter on dpr) to try to get pt scheduled. She states that she had tried to contact pt to get him to call the office but had to leave vm.   Pt called and left vm to schedule procedure.

## 2024-04-17 DIAGNOSIS — X58XXXS Exposure to other specified factors, sequela: Secondary | ICD-10-CM | POA: Diagnosis not present

## 2024-04-17 DIAGNOSIS — S81801A Unspecified open wound, right lower leg, initial encounter: Secondary | ICD-10-CM | POA: Diagnosis not present

## 2024-04-17 DIAGNOSIS — S81801D Unspecified open wound, right lower leg, subsequent encounter: Secondary | ICD-10-CM | POA: Diagnosis not present

## 2024-04-17 DIAGNOSIS — S81801S Unspecified open wound, right lower leg, sequela: Secondary | ICD-10-CM | POA: Diagnosis not present

## 2024-04-17 DIAGNOSIS — M86072 Acute hematogenous osteomyelitis, left ankle and foot: Secondary | ICD-10-CM | POA: Diagnosis not present

## 2024-04-17 DIAGNOSIS — S81802A Unspecified open wound, left lower leg, initial encounter: Secondary | ICD-10-CM | POA: Diagnosis not present

## 2024-04-17 DIAGNOSIS — S81802S Unspecified open wound, left lower leg, sequela: Secondary | ICD-10-CM | POA: Diagnosis not present

## 2024-04-17 DIAGNOSIS — S91302D Unspecified open wound, left foot, subsequent encounter: Secondary | ICD-10-CM | POA: Diagnosis not present

## 2024-04-18 ENCOUNTER — Ambulatory Visit: Admitting: Infectious Diseases

## 2024-04-19 DIAGNOSIS — G8220 Paraplegia, unspecified: Secondary | ICD-10-CM | POA: Diagnosis not present

## 2024-04-19 DIAGNOSIS — L89512 Pressure ulcer of right ankle, stage 2: Secondary | ICD-10-CM | POA: Diagnosis not present

## 2024-04-19 DIAGNOSIS — E1142 Type 2 diabetes mellitus with diabetic polyneuropathy: Secondary | ICD-10-CM | POA: Diagnosis not present

## 2024-04-19 DIAGNOSIS — L89892 Pressure ulcer of other site, stage 2: Secondary | ICD-10-CM | POA: Diagnosis not present

## 2024-04-19 DIAGNOSIS — I119 Hypertensive heart disease without heart failure: Secondary | ICD-10-CM | POA: Diagnosis not present

## 2024-04-19 DIAGNOSIS — J449 Chronic obstructive pulmonary disease, unspecified: Secondary | ICD-10-CM | POA: Diagnosis not present

## 2024-04-21 ENCOUNTER — Encounter: Payer: Self-pay | Admitting: Infectious Disease

## 2024-04-21 DIAGNOSIS — M86172 Other acute osteomyelitis, left ankle and foot: Secondary | ICD-10-CM | POA: Insufficient documentation

## 2024-04-21 NOTE — Progress Notes (Unsigned)
 Reason for Infectious Disease Consultation: Osteomyelitis of left talus   Requesting Physician: Duwaine Jumper, MD   Subjective:    Patient ID: Patrick Aguilar, male    DOB: 04-Jun-1951, 73 y.o.   MRN: [id number]  HPI  Discussed the use of AI scribe software for clinical note transcription with the patient, who gave verbal consent to proceed.  History of Present Illness   Patrick Aguilar is a 73 year old male with diabetes and hypertension who presents with a chronic wound on the left ankle with suspected osteomyelitis. He was referred by Dr. Duwaine Krone for evaluation of this with consideration of IV vs po antibiotics.  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 He has no sensation from the waist down, including in his legs, contributing to the development of pressure wounds.  He has been dealing with a chronic wound on his left ankle for over a year. The wound care team has been managing it, and an MRI was recently performed because the wound was deep enough to expose bone. The MRI was done lend of July showed osteomyelitis of the talus on the left.  He is currently on doxycycline , an antibiotic, which he has been taking intermittently for two weeks at a time. He has been on it for two weeks, off for a week, and then back on it. He is unsure if the antibiotic has helped as he cannot see or feel the affected area due to his paralysis.  He has a history of diabetes and hypertension. Despite these conditions, he reports good circulation in his legs. He lives at home and receives assistance with bandages from [facility], a home health service. No ulcers on the bottom of his feet. No sensation from the waist down, including in his  legs.       Past Medical History:  Diagnosis Date   AKI (acute kidney injury) (HCC) 12/2017   Angio-edema    Anxiety    Arthritis    BPH (benign prostatic hyperplasia)    Complication of anesthesia    low respirations, low BP   COPD (chronic obstructive pulmonary disease) (HCC)    DDD (degenerative disc disease), lumbar    Diabetes mellitus without complication (HCC)    type 2   Diastolic dysfunction 12/14/2017   Moderate noted on ECHO   Fournier's gangrene in male    GERD (gastroesophageal reflux disease)    History of ARDS    History of necrotizing fasciitis    Severe   Hypertension    Lumbar spondylosis    Mixed hyperlipidemia    Numbness and tingling of both upper extremities    Paraplegia [facility])    January 2023   PE (pulmonary thromboembolism) (HCC)    Pulmonary hypertension (HCC) 12/14/2017   Mild, noted on ECHO   Recurrent upper respiratory infection (URI)    Respiratory failure, acute (HCC) 12/2017   Septic shock (HCC) 12/2017   Shortness of breath dyspnea    Testicular pain, left    Wears glasses     Past Surgical History:  Procedure Laterality Date   ADENOIDECTOMY     APPENDECTOMY     APPLICATION OF A-CELL OF CHEST/ABDOMEN N/A 01/08/2018   Procedure: APPLICATION OF A-CELL  OF GROIN;  Surgeon: Lowery Estefana RAMAN, DO;  Location: [facility] OR;  Service: Plastics;  Laterality: N/A;   APPLICATION OF A-CELL OF EXTREMITY N/A 12/25/2017   Procedure: APPLICATION OF A-CELL;  Surgeon: Lowery Estefana RAMAN, DO;  Location: [facility] ORS;  Service: Plastics;  Laterality: N/A;   BACK SURGERY     x5   CARDIAC CATHETERIZATION  2006   neg   CERVICAL DISC SURGERY     x2   COLONOSCOPY     DEBRIDEMENT AND CLOSURE WOUND N/A 01/26/2018   Procedure: REVISION OF PERINEUM WOUND WITH DEBRIDEMENT, PARTIAL CLOSURE OF PERINEUM, PLACEMENT OF CELLERATE COLLAGEN;  Surgeon: Lowery Estefana RAMAN, DO;  Location: [facility];  Service: Plastics;  Laterality: N/A;   DENTAL SURGERY     teeth extractions    ESOPHAGOGASTRODUODENOSCOPY N/A 01/12/2024   Procedure: EGD (ESOPHAGOGASTRODUODENOSCOPY);  Surgeon: Eartha Flavors, Toribio, MD;  Location: [facility];  Service: Gastroenterology;  Laterality: N/A;   groin wound  01/08/2018   : EXCISION OF GROIN WOUND WITH PLACEMENT OF ACELL, AND PRIMARY WOUND CLOSURE (N/A Scrotum)   I & D EXTREMITY N/A 03/12/2018   Procedure: IRRIGATION AND DEBRIDEMENT PERIMUM WOUND WITH CLOSURE;  Surgeon: Lowery Estefana RAMAN, DO;  Location: [facility] OR;  Service: Plastics;  Laterality: N/A;   INCISION AND DRAINAGE ABSCESS Left 07/11/2018   Procedure: INCISION AND DRAINAGE ABSCESS LEFT THIGH;  Surgeon: Matilda Senior, MD;  Location: [facility];  Service: Urology;  Laterality: Left;   INCISION AND DRAINAGE OF WOUND N/A 12/25/2017   Procedure: Irrigation and debridement of Fournier's of scrotum with placement of testes in subcutaneous thigh pockets and Acell placement;  Surgeon: Lowery Estefana RAMAN, DO;  Location: [facility];  Service: Plastics;  Laterality: N/A;   INCISION AND DRAINAGE OF WOUND N/A 01/08/2018   Procedure: EXCISION OF GROIN WOUND WITH PLACEMENT OF ACELL, AND PRIMARY WOUND CLOSURE;  Surgeon: Lowery Estefana RAMAN, DO;  Location: [facility] OR;  Service: Plastics;  Laterality: N/A;   ORCHIECTOMY N/A 12/13/2017   Procedure: EXCISION OF SCROTUM AND DEBRIDEMENT OF PENIS;  Surgeon: Matilda Senior, MD;  Location: [facility];  Service: Urology;  Laterality: N/A;   ORCHIECTOMY Left 06/22/2018   Procedure: ORCHIECTOMY;  Surgeon: Matilda Senior, MD;  [facility];  Service: Urology;  Laterality: Left;   PLANTAR FASCIA SURGERY Bilateral    shoulders Bilateral    rotator cuff   SUBMANDIBULAR GLAND EXCISION Left 03/12/2015   Procedure: LEFT SUBMANDIBULAR GLAND RESECTION;  Surgeon: Vaughan Ricker, MD;  Location: [facility] OR;  Service: ENT;  Laterality: Left;   TONSILLECTOMY     TOTAL HIP ARTHROPLASTY Right 03/06/2019   Procedure: TOTAL HIP ARTHROPLASTY ANTERIOR  APPROACH;  Surgeon: Melodi Lerner, MD;  Location: [facility];  Service: Orthopedics;  Laterality: Right;     Family History  Problem Relation Age of Onset   Lung cancer Mother    Bladder Cancer Father    Allergic rhinitis Neg Hx    Asthma Neg Hx    Eczema Neg Hx    Urticaria Neg Hx       Social History   Socioeconomic History   Marital status: Single    Spouse name: Not on file   Number of children: Not on file   Years of education: Not on file   Highest education level: Not on file  Occupational History   Not on file  Tobacco Use   Smoking status: Former    Current packs/day: 0.00    Average packs/day: 1 pack/day  for 50.0 years (50.0 ttl pk-yrs)    Types: Cigarettes    Start date: 12/14/1966    Quit date: 12/13/2016    Years since quitting: 7.3   Smokeless tobacco: Never  Vaping Use   Vaping status: Never Used  Substance and Sexual Activity   Alcohol use: No    Comment: quit alcohol 80's   Drug use: No   Sexual activity: Not on file  Other Topics Concern   Not on file  Social History Narrative   Not on file   Social Drivers of Health   Financial Resource Strain: Low Risk  (05/09/2022)   Received from [facility]   Overall Financial Resource Strain (CARDIA)    Difficulty of Paying Living Expenses: Not hard at all  Food Insecurity: No Food Insecurity (01/11/2024)   Hunger Vital Sign    Worried About Running Out of Food in the Last Year: Never true    Ran Out of Food in the Last Year: Never true  Transportation Needs: No Transportation Needs (01/11/2024)   PRAPARE - Administrator, Civil Service (Medical): No    Lack of Transportation (Non-Medical): No  Physical Activity: Not on file  Stress: No Stress Concern Present (05/26/2021)   Received from [facility] - Occupational Stress Questionnaire    Feeling of Stress : Not at all  Social Connections: Unknown (01/11/2024)   Social Connection and Isolation  Panel    Frequency of Communication with Friends and Family: More than three times a week    Frequency of Social Gatherings with Friends and Family: More than three times a week    Attends Religious Services: Never    Database administrator or Organizations: No    Attends Banker Meetings: Never    Marital Status: Patient declined    Allergies  Allergen Reactions   Tramadol  Other (See Comments)    tremor   Adenosine     can't tolerate  05/2005   Bupropion     Nausea   Morphine  And Codeine Itching     Current Outpatient Medications:    acetaminophen  (TYLENOL ) 325 MG tablet, Take 2 tablets (650 mg total) by mouth every 6 (six) hours as needed for mild pain (pain score 1-3) (or Fever >/= 101)., Disp: , Rfl:    albuterol  (PROVENTIL ) (2.5 MG/3ML) 0.083% nebulizer solution, Inhale 3 mLs (2.5 mg total) into the lungs every 6 (six) hours as needed for shortness of breath or wheezing., Disp: 75 mL, Rfl: 12   albuterol  (VENTOLIN  HFA) 108 (90 Base) MCG/ACT inhaler, Inhale 2 puffs into the lungs every 6 (six) hours as needed for wheezing or shortness of breath., Disp: 8 g, Rfl: 3   aspirin  EC 81 MG tablet, Take 1 tablet (81 mg total) by mouth daily with breakfast., Disp: 30 tablet, Rfl: 11   atenolol  (TENORMIN ) 50 MG tablet, Take 1 tablet (50 mg total) by mouth daily., Disp: 30 tablet, Rfl: 5   baclofen  (LIORESAL ) 10 MG tablet, Take 10 mg by mouth 2 (two) times daily., Disp: , Rfl:    blood glucose meter kit and supplies KIT, Dispense based on patient and insurance preference. Use up to four times daily as directed. (FOR ICD-9 250.00, 250.01)., Disp: 1 each, Rfl: 0   doxazosin  (CARDURA  XL) 8 MG 24 hr tablet, Take 8 mg by mouth daily with breakfast., Disp: , Rfl:    ferrous sulfate  325 (65 FE) MG tablet,  Take 1 tablet (325 mg total) by mouth daily with breakfast., Disp: 30 tablet, Rfl: 1   Fexofenadine HCl (ALLERGY 24-HR PO), Take by mouth., Disp: , Rfl:    furosemide  (LASIX ) 20 MG  tablet, Take 20 mg by mouth daily after breakfast. , Disp: , Rfl:    gabapentin  (NEURONTIN ) 800 MG tablet, Take 1 tablet (800 mg total) by mouth See admin instructions. Take 1 tablet in the morning and 3 tablets every evening., Disp: 120 tablet, Rfl: 1   glipiZIDE  (GLUCOTROL ) 5 MG tablet, Take 5 mg by mouth 2 (two) times daily before a meal., Disp: , Rfl:    Insulin  Pen Needle (BD PEN NEEDLE NANO U/F) 32G X 4 MM MISC, 1 each by Does not apply route 4 (four) times daily. (Patient not taking: Reported on 10/12/2022), Disp: 100 each, Rfl: 2   metFORMIN  (GLUCOPHAGE ) 500 MG tablet, Take 500 mg by mouth 2 (two) times daily with a meal. Take 1/2 taingblet in the morning and 1/2 every evening, Disp: , Rfl:    oxyCODONE -acetaminophen  (PERCOCET) 10-325 MG tablet, Take 1 tablet by mouth every 6 (six) hours., Disp: , Rfl:    pantoprazole  (PROTONIX ) 40 MG tablet, Take 1 tablet (40 mg total) by mouth 2 (two) times daily before a meal., Disp: 180 tablet, Rfl: 3   potassium chloride  (KLOR-CON ) 20 MEQ packet, Take 20 mEq by mouth daily. Take 2 tablets every morning, Disp: , Rfl:    rosuvastatin (CRESTOR) 5 MG tablet, Take 5 mg by mouth once a week. Every Sunday, Disp: , Rfl:    traZODone  (DESYREL ) 100 MG tablet, Take 100 mg by mouth See admin instructions. 2 tablets at bedtime, Disp: , Rfl:     Review of Systems  Constitutional:  Negative for activity change, appetite change, chills, diaphoresis, fatigue, fever and unexpected weight change.  HENT:  Negative for congestion, rhinorrhea, sinus pressure, sneezing, sore throat and trouble swallowing.   Eyes:  Negative for photophobia and visual disturbance.  Respiratory:  Negative for cough, chest tightness, shortness of breath, wheezing and stridor.   Cardiovascular:  Negative for chest pain, palpitations and leg swelling.  Gastrointestinal:  Negative for abdominal distention, abdominal pain, anal bleeding, blood in stool, constipation, diarrhea, nausea and vomiting.   Genitourinary:  Negative for difficulty urinating, dysuria, flank pain and hematuria.  Musculoskeletal:  Negative for arthralgias, back pain, gait problem, joint swelling and myalgias.  Skin:  Positive for wound. Negative for color change, pallor and rash.  Neurological:  Negative for dizziness, tremors, weakness and light-headedness.  Hematological:  Negative for adenopathy. Does not bruise/bleed easily.  Psychiatric/Behavioral:  Negative for agitation, behavioral problems, confusion, decreased concentration, dysphoric mood and sleep disturbance.        Objective:   Physical Exam Constitutional:      Appearance: He is well-developed.  HENT:     Head: Normocephalic and atraumatic.  Eyes:     Conjunctiva/sclera: Conjunctivae normal.  Cardiovascular:     Rate and Rhythm: Normal rate and regular rhythm.  Pulmonary:     Effort: Pulmonary effort is normal. No respiratory distress.     Breath sounds: No wheezing.  Abdominal:     General: There is no distension.     Palpations: Abdomen is soft.  Musculoskeletal:     Cervical back: Normal range of motion and neck supple.  Skin:    General: Skin is warm and dry.     Coloration: Skin is not pale.     Findings: No erythema or  rash.  Neurological:     Mental Status: He is alert and oriented to person, place, and time.  Psychiatric:        Mood and Affect: Mood normal.        Behavior: Behavior normal.        Thought Content: Thought content normal.        Judgment: Judgment normal.    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 He understands that if we cannot cure this with IV antibiotics and wound care that he would need a BKA or AKA for cure given the location --check BMP, CBC w diff, ESR, CRP - Order PICC line placement for IV antibiotic administration. - Initiate daptomycin IV therapy for six weeks, pending insurance  approval. - Monitor inflammatory markers and kidney function through weekly labs. - Educate on potential side effects of daptomycin, including muscle inflammation and eosinophilic pneumonia. - Continue doxycycline  until IV antibiotics are initiated. - Coordinate with home health for IV antibiotic administration. - Schedule follow-up appointment for late September or early October. --continue wound care      Diagnosis: Osteomyelitis  Culture Result: MRSA  Allergies  Allergen Reactions   Tramadol  Other (See Comments)    tremor   Adenosine     can't tolerate  05/2005   Bupropion     Nausea   Morphine  And Codeine Itching    OPAT Orders Discharge antibiotics to be given via PICC line Discharge antibiotics: Daptomycin 850mg  daily   End Date: September 29th 2025  [facility] Care Per Protocol:  Home health RN for IV administration and teaching; PICC line care and labs.    Labs weekly while on IV antibiotics: _x_ CBC with differential _x_ BMP __ CMP _x_ CRP _x_ ESR __ Vancomycin  trough _x_ CK  _x_ Please pull PIC at completion of IV antibiotics __ Please leave PIC in place until doctor has seen patient or been notified  Fax weekly labs to [phone]  I have personally spent 64 minutes involved in face-to-face and non-face-to-face activities for this patient on the day of the visit. Professional time spent includes the following activities: Preparing to see the patient (review of tests), Obtaining and/or reviewing separately obtained history (admission/discharge record), Performing a medically appropriate examination and/or evaluation , Ordering medications/tests/procedures, referring and communicating with other health care professionals, Documenting clinical information in the EMR, Independently interpreting results (not separately reported), Communicating results to the patient/family/caregiver, Counseling and educating the patient/family/caregiver and Care coordination (not  separately reported).

## 2024-04-22 ENCOUNTER — Ambulatory Visit: Admitting: "Endocrinology

## 2024-04-22 ENCOUNTER — Telehealth: Payer: Self-pay

## 2024-04-22 ENCOUNTER — Encounter: Payer: Self-pay | Admitting: Infectious Disease

## 2024-04-22 ENCOUNTER — Encounter: Payer: Self-pay | Admitting: "Endocrinology

## 2024-04-22 ENCOUNTER — Ambulatory Visit: Admitting: Infectious Disease

## 2024-04-22 ENCOUNTER — Other Ambulatory Visit: Payer: Self-pay

## 2024-04-22 VITALS — BP 133/72 | HR 58 | Temp 97.6°F

## 2024-04-22 VITALS — BP 116/64 | HR 80

## 2024-04-22 DIAGNOSIS — Z7984 Long term (current) use of oral hypoglycemic drugs: Secondary | ICD-10-CM

## 2024-04-22 DIAGNOSIS — A4902 Methicillin resistant Staphylococcus aureus infection, unspecified site: Secondary | ICD-10-CM

## 2024-04-22 DIAGNOSIS — J449 Chronic obstructive pulmonary disease, unspecified: Secondary | ICD-10-CM | POA: Diagnosis not present

## 2024-04-22 DIAGNOSIS — I10 Essential (primary) hypertension: Secondary | ICD-10-CM

## 2024-04-22 DIAGNOSIS — G8220 Paraplegia, unspecified: Secondary | ICD-10-CM

## 2024-04-22 DIAGNOSIS — E1142 Type 2 diabetes mellitus with diabetic polyneuropathy: Secondary | ICD-10-CM | POA: Diagnosis not present

## 2024-04-22 DIAGNOSIS — E782 Mixed hyperlipidemia: Secondary | ICD-10-CM

## 2024-04-22 DIAGNOSIS — E119 Type 2 diabetes mellitus without complications: Secondary | ICD-10-CM

## 2024-04-22 DIAGNOSIS — L89892 Pressure ulcer of other site, stage 2: Secondary | ICD-10-CM | POA: Diagnosis not present

## 2024-04-22 DIAGNOSIS — I119 Hypertensive heart disease without heart failure: Secondary | ICD-10-CM | POA: Diagnosis not present

## 2024-04-22 DIAGNOSIS — M86172 Other acute osteomyelitis, left ankle and foot: Secondary | ICD-10-CM

## 2024-04-22 DIAGNOSIS — L89512 Pressure ulcer of right ankle, stage 2: Secondary | ICD-10-CM | POA: Diagnosis not present

## 2024-04-22 LAB — POCT GLYCOSYLATED HEMOGLOBIN (HGB A1C): HbA1c, POC (controlled diabetic range): 7.2 % — AB (ref 0.0–7.0)

## 2024-04-22 NOTE — Progress Notes (Signed)
 04/22/2024, 4:10 PM  Endocrinology follow-up note  Subjective:    Patient ID: Patrick Aguilar, male    DOB: 30-Nov-1950.  Patrick Aguilar is being seen in follow-up after he was seen in consultation for management of currently controlled asymptomatic diabetes requested by  Maree Isles, MD.   Past Medical History:  Diagnosis Date   Acute osteomyelitis of left talus (HCC) 04/21/2024   AKI (acute kidney injury) (HCC) 12/2017   Angio-edema    Anxiety    Arthritis    BPH (benign prostatic hyperplasia)    Complication of anesthesia    low respirations, low BP   COPD (chronic obstructive pulmonary disease) (HCC)    DDD (degenerative disc disease), lumbar    Diabetes mellitus without complication (HCC)    type 2   Diastolic dysfunction 12/14/2017   Moderate noted on ECHO   Fournier's gangrene in male    GERD (gastroesophageal reflux disease)    History of ARDS    History of necrotizing fasciitis    Severe   Hypertension    Lumbar spondylosis    Mixed hyperlipidemia    Numbness and tingling of both upper extremities    Paraplegia [facility])    January 2023   PE (pulmonary thromboembolism) (HCC)    Pulmonary hypertension (HCC) 12/14/2017   Mild, noted on ECHO   Recurrent upper respiratory infection (URI)    Respiratory failure, acute (HCC) 12/2017   Septic shock (HCC) 12/2017   Shortness of breath dyspnea    Testicular pain, left    Wears glasses     Past Surgical History:  Procedure Laterality Date   ADENOIDECTOMY     APPENDECTOMY     APPLICATION OF A-CELL OF CHEST/ABDOMEN N/A 01/08/2018   Procedure: APPLICATION OF A-CELL OF GROIN;  Surgeon: Lowery Estefana RAMAN, DO;  Location: [facility] OR;  Service: Plastics;  Laterality: N/A;   APPLICATION OF A-CELL OF EXTREMITY N/A 12/25/2017   Procedure: APPLICATION OF A-CELL;  Surgeon: Lowery Estefana RAMAN, DO;  Location: [facility];  Service: Plastics;  Laterality: N/A;   BACK SURGERY     x5   CARDIAC CATHETERIZATION   2006   neg   CERVICAL DISC SURGERY     x2   COLONOSCOPY     DEBRIDEMENT AND CLOSURE WOUND N/A 01/26/2018   Procedure: REVISION OF PERINEUM WOUND WITH DEBRIDEMENT, PARTIAL CLOSURE OF PERINEUM, PLACEMENT OF CELLERATE COLLAGEN;  Surgeon: Lowery Estefana RAMAN, DO;  Location: [facility];  Service: Plastics;  Laterality: N/A;   DENTAL SURGERY     teeth extractions   ESOPHAGOGASTRODUODENOSCOPY N/A 01/12/2024   Procedure: EGD (ESOPHAGOGASTRODUODENOSCOPY);  Surgeon: Eartha Flavors, Toribio, MD;  [facility];  Service: Gastroenterology;  Laterality: N/A;   groin wound  01/08/2018   : EXCISION OF GROIN WOUND WITH PLACEMENT OF ACELL, AND PRIMARY WOUND CLOSURE (N/A Scrotum)   I & D EXTREMITY N/A 03/12/2018   Procedure: IRRIGATION AND DEBRIDEMENT PERIMUM WOUND WITH CLOSURE;  Surgeon: Lowery Estefana RAMAN, DO;  Location: [facility] OR;  Service: Plastics;  Laterality: N/A;   INCISION AND DRAINAGE ABSCESS Left 07/11/2018   Procedure: INCISION AND DRAINAGE ABSCESS LEFT THIGH;  Surgeon: Matilda Senior, MD;  Location: [facility];  Service: Urology;  Laterality: Left;   INCISION AND DRAINAGE OF WOUND N/A 12/25/2017   Procedure: Irrigation and debridement of Fournier's of scrotum with placement of testes in subcutaneous thigh pockets and Acell placement;  Surgeon: Lowery Estefana RAMAN, DO;  Location: [facility];  Service: Government social research officer;  Laterality: N/A;   INCISION AND DRAINAGE OF WOUND N/A 01/08/2018   Procedure: EXCISION OF GROIN WOUND WITH PLACEMENT OF ACELL, AND PRIMARY WOUND CLOSURE;  Surgeon: Lowery Estefana RAMAN, DO;  Location: [facility];  Service: Plastics;  Laterality: N/A;   ORCHIECTOMY N/A 12/13/2017   Procedure: EXCISION OF SCROTUM AND DEBRIDEMENT OF PENIS;  Surgeon: Matilda Senior, MD;  Location: [facility];  Service: Urology;  Laterality: N/A;   ORCHIECTOMY Left 06/22/2018   Procedure: ORCHIECTOMY;  Surgeon: Matilda Senior, MD;  Location: [facility];  Service: Urology;  Laterality: Left;    PLANTAR FASCIA SURGERY Bilateral    shoulders Bilateral    rotator cuff   SUBMANDIBULAR GLAND EXCISION Left 03/12/2015   Procedure: LEFT SUBMANDIBULAR GLAND RESECTION;  Surgeon: Vaughan Ricker, MD;  Location: [facility] OR;  Service: ENT;  Laterality: Left;   TONSILLECTOMY     TOTAL HIP ARTHROPLASTY Right 03/06/2019   Procedure: TOTAL HIP ARTHROPLASTY ANTERIOR APPROACH;  Surgeon: Melodi Lerner, MD;  Location: [facility];  Service: Orthopedics;  Laterality: Right;     Social History   Socioeconomic History   Marital status: Single    Spouse name: Not on file   Number of children: Not on file   Years of education: Not on file   Highest education level: Not on file  Occupational History   Not on file  Tobacco Use   Smoking status: Former    Current packs/day: 0.00    Average packs/day: 1 pack/day for 50.0 years (50.0 ttl pk-yrs)    Types: Cigarettes    Start date: 12/14/1966    Quit date: 12/13/2016    Years since quitting: 7.3   Smokeless tobacco: Never  Vaping Use   Vaping status: Never Used  Substance and Sexual Activity   Alcohol use: No    Comment: quit alcohol 80's   Drug use: No   Sexual activity: Not on file  Other Topics Concern   Not on file  Social History Narrative   Not on file   Social Drivers of Health   Financial Resource Strain: Low Risk  (05/09/2022)   Received from [facility]   Overall Financial Resource Strain (CARDIA)    Difficulty of Paying Living Expenses: Not hard at all  Food Insecurity: No Food Insecurity (01/11/2024)   Hunger Vital Sign    Worried About Running Out of Food in the Last Year: Never true    Ran Out of Food in the Last Year: Never true  Transportation Needs: No Transportation Needs (01/11/2024)   PRAPARE - Administrator, Civil Service (Medical): No    Lack of Transportation (Non-Medical): No  Physical Activity: Not on file  Stress: No Stress Concern Present (05/26/2021)   Received from [facility]  Occupational Health - Occupational Stress Questionnaire    Feeling of Stress : Not at all  Social Connections: Unknown (01/11/2024)   Social Connection and Isolation Panel    Frequency of Communication with Friends and Family: More than three times a week    Frequency of Social Gatherings with Friends and Family: More than three times a week    Attends Religious Services: Never    Database administrator or Organizations: No    Attends Banker Meetings: Never    Marital Status: Patient declined    Family History  Problem Relation Age of Onset   Lung cancer Mother    Bladder Cancer Father  Allergic rhinitis Neg Hx    Asthma Neg Hx    Eczema Neg Hx    Urticaria Neg Hx     Outpatient Encounter Medications as of 04/22/2024  Medication Sig   acetaminophen  (TYLENOL ) 325 MG tablet Take 2 tablets (650 mg total) by mouth every 6 (six) hours as needed for mild pain (pain score 1-3) (or Fever >/= 101).   albuterol  (PROVENTIL ) (2.5 MG/3ML) 0.083% nebulizer solution Inhale 3 mLs (2.5 mg total) into the lungs every 6 (six) hours as needed for shortness of breath or wheezing. (Patient not taking: Reported on 04/22/2024)   albuterol  (VENTOLIN  HFA) 108 (90 Base) MCG/ACT inhaler Inhale 2 puffs into the lungs every 6 (six) hours as needed for wheezing or shortness of breath. (Patient not taking: Reported on 04/22/2024)   aspirin  EC 81 MG tablet Take 1 tablet (81 mg total) by mouth daily with breakfast.   atenolol  (TENORMIN ) 50 MG tablet Take 1 tablet (50 mg total) by mouth daily.   baclofen  (LIORESAL ) 10 MG tablet Take 10 mg by mouth 2 (two) times daily.   blood glucose meter kit and supplies KIT Dispense based on patient and insurance preference. Use up to four times daily as directed. (FOR ICD-9 250.00, 250.01).   doxazosin  (CARDURA  XL) 8 MG 24 hr tablet Take 8 mg by mouth daily with breakfast.   doxycycline  (VIBRA -TABS) 100 MG tablet Take 100 mg by mouth.   ferrous sulfate  325 (65 FE) MG  tablet Take 1 tablet (325 mg total) by mouth daily with breakfast.   Fexofenadine HCl (ALLERGY 24-HR PO) Take by mouth.   furosemide  (LASIX ) 20 MG tablet Take 20 mg by mouth daily after breakfast.    gabapentin  (NEURONTIN ) 800 MG tablet Take 1 tablet (800 mg total) by mouth See admin instructions. Take 1 tablet in the morning and 3 tablets every evening.   glipiZIDE  (GLUCOTROL ) 5 MG tablet Take 5 mg by mouth 2 (two) times daily before a meal.   Insulin  Pen Needle (BD PEN NEEDLE NANO U/F) 32G X 4 MM MISC 1 each by Does not apply route 4 (four) times daily. (Patient not taking: Reported on 10/12/2022)   metFORMIN  (GLUCOPHAGE ) 500 MG tablet Take 500 mg by mouth 2 (two) times daily with a meal. Take 1/2 taingblet in the morning and 1/2 every evening   oxyCODONE -acetaminophen  (PERCOCET) 10-325 MG tablet Take 1 tablet by mouth every 6 (six) hours.   pantoprazole  (PROTONIX ) 40 MG tablet Take 1 tablet (40 mg total) by mouth 2 (two) times daily before a meal.   potassium chloride  (KLOR-CON ) 20 MEQ packet Take 20 mEq by mouth daily. Take 2 tablets every morning   rosuvastatin (CRESTOR) 5 MG tablet Take 5 mg by mouth once a week. Every Sunday   traZODone  (DESYREL ) 100 MG tablet Take 100 mg by mouth See admin instructions. 2 tablets at bedtime   No facility-administered encounter medications on file as of 04/22/2024.    ALLERGIES: Allergies  Allergen Reactions   Tramadol  Other (See Comments)    tremor   Adenosine     can't tolerate  05/2005   Bupropion     Nausea   Morphine  And Codeine Itching    VACCINATION STATUS: Immunization History  Administered Date(s) Administered   Td,absorbed, Preservative Free, Adult Use, Lf Unspecified 03/05/2007   Tdap 08/10/2017   Zoster Recombinant(Shingrix) 08/10/2017    Diabetes He presents for his follow-up diabetic visit. He has type 2 diabetes mellitus. Onset time: She was diagnosed at  approximate age of 69 years. His disease course has been stable. There are  no hypoglycemic associated symptoms. Pertinent negatives for hypoglycemia include no confusion, headaches, pallor or seizures. There are no diabetic associated symptoms. Pertinent negatives for diabetes include no chest pain, no fatigue, no polydipsia, no polyphagia, no polyuria and no weakness. There are no hypoglycemic complications. Symptoms are stable. Diabetic complications include nephropathy, peripheral neuropathy and PVD. Risk factors for coronary artery disease include diabetes mellitus, dyslipidemia, hypertension, male sex and sedentary lifestyle. Current diabetic treatments: Is currently on Metformin  500 mg p.o. once  daily, glipizide  5 mg po BID. His weight is fluctuating minimally. He is following a generally unhealthy diet. When asked about meal planning, he reported none. He has not had a previous visit with a dietitian. His home blood glucose trend is fluctuating minimally. His overall blood glucose range is 130-140 mg/dl. (His presents without any meter nor logs.  His point-of-care A1c is 7.2%, generally stable.  He denies hypoglycemia.       ) An ACE inhibitor/angiotensin II  receptor blocker is being taken.  Hyperlipidemia This is a chronic problem. The current episode started more than 1 year ago. Exacerbating diseases include diabetes. Pertinent negatives include no chest pain, myalgias or shortness of breath. Current antihyperlipidemic treatment includes statins. Risk factors for coronary artery disease include diabetes mellitus, dyslipidemia, male sex, hypertension and a sedentary lifestyle.  Hypertension This is a chronic problem. The current episode started more than 1 year ago. The problem is controlled. Pertinent negatives include no chest pain, headaches, neck pain, palpitations or shortness of breath. Risk factors for coronary artery disease include diabetes mellitus, dyslipidemia, sedentary lifestyle and smoking/tobacco exposure. Past treatments include beta blockers and  diuretics. Hypertensive end-organ damage includes heart failure and PVD.    Objective:       04/22/2024    1:53 PM 04/22/2024   10:07 AM 02/13/2024   11:46 AM  Vitals with BMI  Weight   196 lbs  Systolic 116 133 877  Diastolic 64 72 73  Pulse 80 58 81    BP 116/64   Pulse 80   Wt Readings from Last 3 Encounters:  02/13/24 196 lb (88.9 kg)  01/11/24 196 lb 10.4 oz (89.2 kg)  09/07/22 182 lb 15.7 oz (83 kg)       CMP ( most recent) CMP     Component Value Date/Time   NA 143 03/26/2024 0000   K 3.6 03/26/2024 0000   CL 98 (A) 03/26/2024 0000   CO2 24 (A) 03/26/2024 0000   GLUCOSE 155 (H) 01/13/2024 0451   BUN 29 (A) 03/26/2024 0000   CREATININE 1.3 03/26/2024 0000   CREATININE 0.93 01/13/2024 0451   CREATININE 1.13 05/13/2020 0000   CALCIUM 9.3 03/26/2024 0000   PROT 7.1 01/11/2024 1123   PROT 6.6 04/06/2022 1129   ALBUMIN  3.8 03/26/2024 0000   ALBUMIN  3.9 04/06/2022 1129   AST 12 (A) 03/26/2024 0000   ALT 9 (A) 03/26/2024 0000   ALKPHOS 83 03/26/2024 0000   BILITOT 0.3 01/11/2024 1123   BILITOT 0.3 04/06/2022 1129   GFRNONAA >60 01/13/2024 0451   GFRNONAA 66 05/13/2020 0000   GFRAA 76 05/13/2020 0000     Diabetic Labs (most recent): Lab Results  Component Value Date   HGBA1C 7.2 (A) 04/22/2024   HGBA1C 7.1 (H) 01/11/2024   HGBA1C 7.0 08/17/2023   MICROALBUR <0.2 05/13/2020     Lipid Panel ( most recent) Lipid Panel  Component Value Date/Time   CHOL 145 03/26/2024 0000   CHOL 130 04/06/2022 1129   TRIG 124 03/26/2024 0000   HDL 48 03/26/2024 0000   HDL 46 04/06/2022 1129   CHOLHDL 2.8 04/06/2022 1129   LDLCALC 75 03/26/2024 0000   LDLCALC 62 04/06/2022 1129   LABVLDL 22 04/06/2022 1129     Assessment & Plan:   1. Type 2 diabetes mellitus without complication, without long-term current use of insulin  (HCC)  - DEAIRE MCWHIRTER has currently controlled asymptomatic type 2 DM since  73 years of age.  His presents without any meter nor  logs.  His point-of-care A1c is 7.2%, generally stable.  He denies hypoglycemia.    - I had a long discussion with him about the progressive nature of diabetes and the pathology behind its complications. -his diabetes is complicated by  hx of  heavy smoking, deconditioning/sedentary life, peripheral arterial disease, neuropathy , diastolic dysfunction and he remains at a high risk for more acute and chronic complications which include CAD, CVA, CKD, retinopathy, and neuropathy. These are all discussed in detail with him.  - I have counseled him on diet  and weight management  by adopting a carbohydrate restricted/protein rich diet. Patient is encouraged to switch to  unprocessed or minimally processed     complex starch and increased protein intake (animal or plant source), fruits, and vegetables. -  he is advised to stick to a routine mealtimes to eat 3 meals  a day and avoid unnecessary snacks ( to snack only to correct hypoglycemia).   - he acknowledges that there is a room for improvement in his food and drink choices. - Suggestion is made for him to avoid simple carbohydrates  from his diet including Cakes, Sweet Desserts, Ice Cream, Soda (diet and regular), Sweet Tea, Candies, Chips, Cookies, Store Bought Juices, Alcohol in Excess of  1-2 drinks a day, Artificial Sweeteners,  Coffee Creamer, and Sugar-free Products, Lemonade. This will help patient to have more stable blood glucose profile and potentially avoid unintended weight gain.  - he will be scheduled with Penny Crumpton, RDN, CDE for diabetes education.  - I have approached him with the following individualized plan to manage  his diabetes and patient agrees:   -In light of his presentation with A1c of 7.2%, he will not need insulin  treatment for now.  He is advised to continue  metformin   500 mg p.o. twice daily with meals.  He is also advised to continue glipizide  5 mg p.o. daily at breakfast and at supper.    He is urged to start  monitoring blood glucose twice a day-daily before breakfast and at bedtime.    He is encouraged to call clinic for hypoglycemia under 70 mg per DL, or hyperglycemia above 200 mg per DL at fasting.     He is not a candidate for SGLT2 inhibitors due to his prior history of Fournier's gangrene.  - Specific targets for  A1c;  LDL, HDL,  and Triglycerides were discussed with the patient.  2) Blood Pressure /Hypertension:  His blood pressure is controlled to target.  He is advised to discontinue amlodipine .  He is advised to continue  atenolol /HCTZ mg p.o. daily with breakfast .  3) Lipids/Hyperlipidemia:   Review of his recent lipid panel showed improving LDL of 62.  He is advised to continue Crestor 5 mg p.o. nightly.  Side effects and precautions discussed with him.   4)  Weight/Diet: Current documented weight is 182  pounds.  He is not a candidate for major weight loss.  I discussed with him the fact that loss of 5 - 10% of his  current body weight will have the most impact on his diabetes management.  Exercise, and detailed carbohydrates information provided  -  detailed on discharge instructions.  5) Chronic Care/Health Maintenance:  -he  is on Statin medications and  is encouraged to initiate and continue to follow up with Ophthalmology, Dentist,  Podiatrist at least yearly or according to recommendations, and advised to   stay away from smoking. I have recommended yearly flu vaccine and pneumonia vaccine at least every 5 years; cannot exercise optimally; and  sleep for at least 7 hours a day. He is advised to stay close to his wound care team and finish his  course of antibiotics.  - he is  advised to maintain close follow up with Maree Isles, MD for primary care needs, as well as his other providers for optimal and coordinated care.   I spent  25  minutes in the care of the patient today including review of labs from CMP, Lipids, Thyroid  Function, Hematology (current and previous  including abstractions from other facilities); face-to-face time discussing  his blood glucose readings/logs, discussing hypoglycemia and hyperglycemia episodes and symptoms, medications doses, his options of short and long term treatment based on the latest standards of care / guidelines;  discussion about incorporating lifestyle medicine;  and documenting the encounter. Risk reduction counseling performed per USPSTF guidelines to reduce cardiovascular risk factors.     Please refer to Patient Instructions for Blood Glucose Monitoring and Insulin /Medications Dosing Guide  in media tab for additional information. Please  also refer to  Patient Self Inventory in the Media  tab for reviewed elements of pertinent patient history.  Carlin LELON Sheldon participated in the discussions, expressed understanding, and voiced agreement with the above plans.  All questions were answered to his satisfaction. he is encouraged to contact clinic should he have any questions or concerns prior to his return visit.  Follow up plan: - Return in about 6 months (around 10/23/2024) for Bring Meter/CGM Device/Logs- A1c in Office.  Ranny Earl, MD [facility] [address] [place], [place] [address] Phone: [phone]  Fax: [phone]    04/22/2024, 4:10 PM  This note was partially dictated with voice recognition software. Similar sounding words can be transcribed inadequately or may not  be corrected upon review.

## 2024-04-22 NOTE — Telephone Encounter (Signed)
 New OPAT orders per Dr. Fleeta Rothman, orders shared with Holley Herring, RN at [facility] and [facility] pharmacy staff.   IR appointment: 04/26/24 @ 9 am  - appointment time and location provided to both patient and [facility].   First dose: short stay - Advanced aware and orders faxed to short stay

## 2024-04-23 ENCOUNTER — Ambulatory Visit: Payer: Self-pay

## 2024-04-23 LAB — CBC WITH DIFFERENTIAL/PLATELET
Absolute Lymphocytes: 2006 {cells}/uL (ref 850–3900)
Absolute Monocytes: 767 {cells}/uL (ref 200–950)
Basophils Absolute: 59 {cells}/uL (ref 0–200)
Basophils Relative: 0.5 %
Eosinophils Absolute: 295 {cells}/uL (ref 15–500)
Eosinophils Relative: 2.5 %
HCT: 38.6 % (ref 38.5–50.0)
Hemoglobin: 11.8 g/dL — ABNORMAL LOW (ref 13.2–17.1)
MCH: 23.4 pg — ABNORMAL LOW (ref 27.0–33.0)
MCHC: 30.6 g/dL — ABNORMAL LOW (ref 32.0–36.0)
MCV: 76.4 fL — ABNORMAL LOW (ref 80.0–100.0)
MPV: 10.9 fL (ref 7.5–12.5)
Monocytes Relative: 6.5 %
Neutro Abs: 8673 {cells}/uL — ABNORMAL HIGH (ref 1500–7800)
Neutrophils Relative %: 73.5 %
Platelets: 324 Thousand/uL (ref 140–400)
RBC: 5.05 Million/uL (ref 4.20–5.80)
RDW: 17.1 % — ABNORMAL HIGH (ref 11.0–15.0)
Total Lymphocyte: 17 %
WBC: 11.8 Thousand/uL — ABNORMAL HIGH (ref 3.8–10.8)

## 2024-04-23 LAB — BASIC METABOLIC PANEL WITHOUT GFR
BUN/Creatinine Ratio: 24 (calc) — ABNORMAL HIGH (ref 6–22)
BUN: 38 mg/dL — ABNORMAL HIGH (ref 7–25)
CO2: 30 mmol/L (ref 20–32)
Calcium: 9.6 mg/dL (ref 8.6–10.3)
Chloride: 96 mmol/L — ABNORMAL LOW (ref 98–110)
Creat: 1.61 mg/dL — ABNORMAL HIGH (ref 0.70–1.28)
Glucose, Bld: 158 mg/dL — ABNORMAL HIGH (ref 65–99)
Potassium: 3.1 mmol/L — ABNORMAL LOW (ref 3.5–5.3)
Sodium: 139 mmol/L (ref 135–146)

## 2024-04-23 LAB — C-REACTIVE PROTEIN: CRP: 48.5 mg/L — ABNORMAL HIGH (ref <8.0)

## 2024-04-23 LAB — SEDIMENTATION RATE: Sed Rate: 97 mm/h — ABNORMAL HIGH (ref 0–20)

## 2024-04-23 NOTE — Telephone Encounter (Signed)
 Melinda returned call, reviewed elevated creatinine and encouraged follow up with PCP. She asked that labs be shared with Dr. Maree, labs routed.   Discussed plan of care with IV antibiotics. She is aware of picc appointment.   Confirmed patient's phone number.   Valaria Kohut, BSN, RN

## 2024-04-23 NOTE — Telephone Encounter (Signed)
 Called patient to discuss results, no answer.   Called patient's daughter, Newell ([place]), no answer. Left HIPAA compliant voicemail requesting callback.   Need to confirm patient's phone number.   Mihika Surrette, BSN, RN

## 2024-04-24 DIAGNOSIS — S81802A Unspecified open wound, left lower leg, initial encounter: Secondary | ICD-10-CM | POA: Diagnosis not present

## 2024-04-24 DIAGNOSIS — S81801D Unspecified open wound, right lower leg, subsequent encounter: Secondary | ICD-10-CM | POA: Diagnosis not present

## 2024-04-24 DIAGNOSIS — S81801A Unspecified open wound, right lower leg, initial encounter: Secondary | ICD-10-CM | POA: Diagnosis not present

## 2024-04-24 DIAGNOSIS — S91302D Unspecified open wound, left foot, subsequent encounter: Secondary | ICD-10-CM | POA: Diagnosis not present

## 2024-04-24 DIAGNOSIS — M86072 Acute hematogenous osteomyelitis, left ankle and foot: Secondary | ICD-10-CM | POA: Diagnosis not present

## 2024-04-24 DIAGNOSIS — S81802S Unspecified open wound, left lower leg, sequela: Secondary | ICD-10-CM | POA: Diagnosis not present

## 2024-04-24 DIAGNOSIS — S81801S Unspecified open wound, right lower leg, sequela: Secondary | ICD-10-CM | POA: Diagnosis not present

## 2024-04-24 NOTE — Progress Notes (Signed)
 Patrick Aguilar 73 y.o. Jun 24, 1951 Phone: [phone] (home)  Address: [address] [place]  [address]  MRN: [id number] Primary MD : Maree Eligio BROCKS, MD   [facility]   Problem List Items Addressed This Visit     Multiple and open wound of lower limb, complicated, left, sequela - Primary   Relevant Medications   silver nitrate applicator 1 Stick (Completed) (Start on 04/24/2024 12:00 PM)   Multiple and open wound of lower limb, complicated, right, sequela   Osteomyelitis of left ankle       Left medial and lateral malleolus ulcers, right lateral malleolus ulcer  Healed.   Right medial malleolus, right posterior leg wounds, left posterior leg wound  All of these wounds have improved and continue to improve. The biofilm was removed and silver nitrate was applied to all wounds.  Cover wounds with adaptic.    Left anterior ankle osteomyelitis with wound  --This wound has been presents since 01/03/24 and started very superficially.  He has seen infectious disease.  He will start daptomycin  on Friday.  Would recommend doing HBO after the IV antibiotics to optimizing the chance of non operative success in treating this.  The patient is not interested in having an AKA.  He has enough doxycycline  to get him through to the daptomycin  starts.  Preoperative diagnosis:  Hypergranulation tissue, biofilm  Postoperative diagnosis: Same  Procedure performed:  Biofilm removed from 6.54 (12.74 squared centimeters last visit), silver nitrate applied to 5 wounds  Anesthesia: None  Surgeon: Duwaine Jumper, MD  Procedure performed in detail:  After informed consent was obtained, biofilm was removed from 6.54 square centimeters with a 7 and 3 curette.  Silver nitrate was then applied to 5 wounds.   Patient tolerated procedure well.       Wound Care Visit  Past Medical History:  Diagnosis Date  . Ankylosing spondylitis      . Arthritis   . BPH (benign prostatic  hyperplasia)   . COPD (chronic obstructive pulmonary disease)      . Diabetes      . DNR (do not resuscitate)   . Dyslipidemia   . GERD with esophagitis   . History of tinnitus   . HTN (hypertension)   . Hypercholesteremia   . Paraplegia      . PE (pulmonary thromboembolism)      . Peripheral neuropathy   . Rupture of right biceps tendon    Past Surgical History:  Procedure Laterality Date  . APPENDECTOMY    . BACK SURGERY X5    . Bilateral foot surgery    . CERVICAL FUSION X 2    . COLONOSCOPY  2020   normal  . REVISION L5/S1 FUSION  2013  . ROTATOR CUFF SURGERY RIGHT AND LEFT     . TONSILLECTOMY    . TOTAL HIP ARTHROPLASTY Right    Allergies  Allergen Reactions  . Morphine  Itching  . Adenosine Other (See Comments)    can't tolerate  05/2005  can't tolerate  05/2005  can't tolerate  05/2005  . Celebrex [Celecoxib] Other (See Comments)    gastritis  . Tramadol  Headache and Other (See Comments)    tremor  . Bupropion Hcl     Nausea  . Bupropion Nausea Only    Nausea  Nausea  Nausea    Meds:  Current Outpatient Medications:  .  albuterol  (PROVENTIL  HFA;VENTOLIN  HFA) 90 mcg/actuation inhaler, Inhale., Disp: , Rfl:  .  amlodipine  besylate (  NORVASC  ORAL), Take 5 mg by mouth., Disp: , Rfl:  .  ascorbic acid, vitamin C, (VITAMIN C) 1,000 mg TbER, Take 1,000 mg by mouth two (2) times a day., Disp: 90 tablet, Rfl: 6 .  aspirin  (ECOTRIN) 81 MG tablet, Take 1 tablet (81 mg total) by mouth daily., Disp: , Rfl:  .  atenolol -chlorthalidone  (TENORETIC ) 50-25 mg per tablet, Take 1 tablet by mouth daily., Disp: , Rfl:  .  baclofen  (LIORESAL ) 20 MG tablet, Take 1 tablet (20 mg total) by mouth two (2) times a day as needed., Disp: , Rfl:  .  celecoxib (CELEBREX) 200 MG capsule, Take 1 capsule (200 mg total) by mouth daily., Disp: , Rfl:  .  doxazosin  (CARDURA ) 8 MG tablet, Take 1 tablet (8 mg total) by mouth in the morning., Disp: 30 tablet, Rfl: 0 .  doxycycline  (VIBRA -TABS) 100  MG tablet, Take 1 tablet (100 mg total) by mouth two (2) times a day for 14 days., Disp: 28 tablet, Rfl: 0 .  duloxetine HCl (DULOXETINE ORAL), Take 60 mg by mouth in the morning., Disp: , Rfl:  .  famotidine  (PEPCID ) 20 MG tablet, Take 1 tablet (20 mg total) by mouth at bedtime., Disp: , Rfl:  .  furosemide  (LASIX ) 20 MG tablet, Take 1 tablet (20 mg total) by mouth daily as needed for swelling., Disp: , Rfl:  .  gabapentin  (NEURONTIN ) 300 MG capsule, Take 800 mg by mouth four (4) times a day. Patient doesn't know how often he takes this medication and home list provided does not state how often., Disp: , Rfl:  .  glipiZIDE  (GLUCOTROL ) 5 MG tablet, Take 2 tablets (10 mg total) by mouth Two (2) times a day (30 minutes before a meal)., Disp: , Rfl:  .  ketoconazole (NIZORAL) 2 % shampoo, Apply topically Two (2) times a week., Disp: , Rfl:  .  metFORMIN  (GLUCOPHAGE ) 1000 MG tablet, Take 0.5 tablets (500 mg total) by mouth in the morning and 0.5 tablets (500 mg total) in the evening. Take with meals., Disp: , Rfl:  .  oxycodone  HCl (OXYCODONE  ORAL), Take 10 mg by mouth every six (6) hours. Not on patient's provided home medication list, Disp: , Rfl:  .  pantoprazole  (PROTONIX ) 40 MG tablet, Take 1 tablet (40 mg total) by mouth daily., Disp: , Rfl:  .  polyethylene glycol (MIRALAX ) 17 gram packet, Take 17 g by mouth in the morning., Disp: , Rfl:  .  potassium chloride  SA (K-DUR,KLOR-CON ) 20 MEQ tablet, Take 2 tablets (40 mEq total) by mouth daily., Disp: , Rfl:  .  rosuvastatin (CRESTOR) 5 MG tablet, Take 1 tablet (5 mg total) by mouth once a week., Disp: , Rfl:  .  tamsulosin HCl (FLOMAX ORAL), Take 0.4 mg by mouth two (2) times a day. Twice daily per provided home med list, Disp: , Rfl:  .  trazodone  HCl (TRAZODONE  ORAL), Take 200 mg by mouth at bedtime., Disp: , Rfl:  .  zinc  acetate 50 mg (zinc ) cap, Take 50 mg by mouth in the morning., Disp: 90 capsule, Rfl: 1 No current facility-administered  medications for this visit.  SocHx:  reports that he has quit smoking. His smoking use included cigarettes. He has been exposed to tobacco smoke. He has never used smokeless tobacco. He reports that he does not currently use alcohol. He reports current drug use. Drug: Oxycodone .  FamHx: He indicated that his mother is deceased. He indicated that his father is deceased.  Review of Systems A 12 system review of systems was negative except as noted in HPI  special needs met: patient confined to wheel chair  Subjective:     Patrick Aguilar presents to the wound care clinic for evaluation of a left hip wound.  He reports he has been unable to ambulate since January 2023 and has been in a wheelchair.  He has history of many back surgeries but is unsure of why he is unable to ambulate. He also has decreased sensation.   04/24/24  He saw Dr. Fleeta Rothman of infectious disease on Monday.  I personally reviewed his note.   The patient wasn't sure how long the ankle wound had been there so he said a year.  It has only been there since 01/03/24.  He recommends daptomycin .  Patrick Aguilar is scheduled to get the PICC on 8/15.  We discussed proceeding with HBO after IV antibiotics.  Reviewed all his labs from this visit in CareEverywhere.  HgA1 is 7.2  Cr 1.61 WBC 11.8, Hb 11.8 and platelets 324.  04/17/24  He has no complaints.  He sees infectious disease on Monday (August 11th)  04/10/24  He returns for follow up.  His MRI was positive for acute osteomyelitis of the left anterolateral talar dome.  He has an infectious disease appointment August 11th.  Impression  1.    Limited, motion degraded exam.  2.    Acute osteomyelitis of the anterolateral talar dome and talar neck with overlying soft tissue wound.  3.    Split appearance of extensor digitorum longus tendon at the level of the ankle and anterolateral soft tissue wound with short segment tenosynovitis, suspicious for infectious tenosynovitis.   4.    Small talar effusion and mild anterior synovitis, nonspecific. Given vicinity of anterolateral wound, this is indeterminate for early septic arthritis.  5.    No drainable fluid collection.     Signed (Electronic Signature): 04/08/2024 4:39 PM  Signed By: Bernardino Drones, MD   Narrative  Exam: MRI Left Foot with and without Contrast     History:  Nonhealing left ankle wound probing to bone. Evaluate for osteomyelitis.     Technique:  Multiplanar, multisequence Patrick images of the left ankle/hindfoot were obtained with and without IV contrast.  IV contrast: 8 mL Gadavist      Comparison:  Left foot radiograph 03/27/2024     Findings:  Limited exam due to motion degradation on multiple sequences.     OSSEOUS STRUCTURES: Focal T1 hypointense signal with overlying cortical erosion, associated bone marrow edema and enhancement of the far anterior lateral aspect of the talar dome and talar neck, suspicious for acute osteomyelitis (series 5 image 16 and series 13 image 14). No other evidence of acute osteomyelitis in the visualized hindfoot and midfoot. No acute fracture or stress reaction. Chronic remodeling and small ossific fragment along anterior process calcaneus.     JOINTS: Alignment maintained. Mild midfoot and hindfoot arthrosis. Small talar effusion and mild anterior synovitis, nonspecific. Otherwise no significant joint effusion or synovitis.     SOFT TISSUES: Soft tissue wound along the anterolateral ankle which tracks to the level of the anterolateral talar dome. Adjacent subcutaneous edema and enhancement. No drainable fluid collection.     TENDONS/MUSCULATURE: Split appearance of extensor digitorum longus tendon at the level of the ankle and anterolateral soft tissue wound with short segment tenosynovitis and enhancement. Distal aspect of the EDL tendon appears intact. Remaining tendon attachments are grossly intact within  limitations of motion. Diffuse intrinsic foot muscle atrophy  and fatty replacement with superimposed edema, likely due to denervation.     NEUROVASCULAR: Unremarkable.     MISCELLANEOUS: None.   I personally reviewed the images and the report.  The pathology from the bone biopsy revealed cartilage but no bone.  Diagnosis  Date Value Ref Range Status  03/20/2024   Final   Specimen 1: Bone, biopsy, left ankle   - FRAGMENTS OF BENIGN CARTILAGE WITH MILD DEGENERATIVE CHANGES.  BONE NOT IDENTIFIED.        04/03/24  He returns for follow up.  He has had his foot x ray.  MRI has been ordered.  He is taking the doxycycline .  Foot x ray  Impression     1. Diffuse osteopenia and degenerative changes limit evaluation.     2. Soft tissue defect likely along the lateral hindfoot. No soft tissue gas or definite focal destructive osseous changes. If clinical concern persists for osteomyelitis, recommend MRI.        Signed (Electronic Signature): 03/28/2024 8:41 AM  Signed By: Roselee Dienes, MD   Narrative  Exam:  Left Foot     History:  Nonhealing wound     Technique:  3 views     Comparison:  10/19/2022     Findings: Diffuse osseous demineralization limits evaluation for nondisplaced fractures and other subtle osseous abnormalities. There is no fracture or malalignment. No destructive osseous changes or periosteal reaction. No definite erosions. Scattered interphalangeal, first MTP, midfoot, and tibiotalar osteoarthrosis. Soft tissue defect likely overlying the lateral hindfoot and malleolus. No significant soft tissue gas. There is no radiopaque foreign body.      I personally reviewed the images and the report.  03/27/24  He has no complaints  Status: Final result     Next appt: 04/03/2024 at 09:30 AM in Wound Care [facility] MARLA Jumper, MD)     Dx: Multiple and open wound of lower limb...   Test Result Released: No   Specimen Information: Ankle, Left; Bone  0 Result Notes Aerobic Culture <1+ Methicillin resistant Staphylococcus  aureus Abnormal     Specimen Source: Ankle, Left        Gram Stain Result No polymorphonuclear leukocytes seen  No organisms seen       Resulting Agency: [facility]   Susceptibility   Methicillin resistant Staphylococcus aureus    MIC SUSCEPTIBILITY RESULT KIRBY BAUER    Clindamycin    Susceptible    Doxycycline    Susceptible    Erythromycin   Susceptible    Fluoroquinolone   No Interpretation 1    Gentamicin   Susceptible 2    Linezolid   Susceptible    Nafcillin   Resistant 3    Trimethoprim  + Sulfamethoxazole    Resistant    Vancomycin  2 Susceptible              1 Fluoroquinolones are not indicated for the treatment of staphylococcal infections, including MRSA.  2 Gentamicin is used only in combination with other active agents that test susceptible  3 Methicillin-resistant staphylococci are resistant to all currently available beta-lactam antibiotics EXCEPT ceftaroline.Contact [facility] lab at [phone] to request ceftaroline susceptibility testing.      Linear View <redacted file path>     I personally reviewed the culture  03/20/24  He reports his sugars have been all over the place.  He was complaining that he isn't getting enough to eat.  03/13/24  He returns for follow up.  He has no complaints.  03/06/24  He returns for follow up.  His [birth year] is on [birth year].  He has no plans.  02/28/24  He returns for follow up.  The hypergranulation tissue has increased in the interim.  He has no complaints.  He saw GI, Charmaine LITTIE Melia, NP, 02/13/24.  Personally reviewed the progress note.  Planning for a colonoscopy.  02/07/24  He returns for follow up. He would like me to look at his backside.  01/31/24  He has no complaints today.  01/24/24  He returns for follow up. His ribs are still sore but he's doing better.  01/17/24  He returns for follow up.  His caregiver slipped and feel on him.  He sustained right rib fractures 7-9.  He also was found to be anemic.  He  underwent an EGD and was found to have several gastric ulcers.  He was admitted to [facility] 01/11/24 - 01/13/24. His hemoglobin was 7.1 on admission and 8 on the last result.  I personally reviewed the discharge summary.  EGD: 01/12/2024  11:50 AM  PATIENT: Patrick Aguilar 73 y.o. male  PRE-OPERATIVE DIAGNOSIS: iron deficiiency anemia  POST-OPERATIVE DIAGNOSIS: gastric ulcer; duodenitis; random gastric biopsies; gastric ulcer biopsies; epinephrine  3mL; clip x1  PROCEDURE: Procedure(s): EGD (ESOPHAGOGASTRODUODENOSCOPY) (N/A)  SURGEON: Surgeons and Role: * Eartha Angelia Sieving, MD - Primary  Patient underwent EGD under propofol  sedation. Tolerated the procedure adequately.  FINDINGS: - Normal esophagus. - Oozing gastric ulcer. Injected. Clip manufacturer: [employer]. Clip (Patrick safe) was placed. Biopsied. - Duodenitis.  RECOMMENDATIONS - Return patient to hospital ward for ongoing care. - Full liquids today, advance diet as tolerated tomorrow. - Use Protonix  (pantoprazole ) 40 mg PO BID. - Await pathology results. - Daily H/H. - No high dose aspirin , meloxicam , celebrex, ibuprofen, naproxen , or other non-steroidal anti-inflammatory drugs. - Return to GI office in 4 weeks. - GI service will sign-off, please call us  back if you have any more questions.  Sieving Eartha, MD Gastroenterology and Hepatology [facility] Gastroenterology Electronically signed by Eartha Angelia, Sieving, MD at 01/12/2024 11:51 AM EDT   Op Note - Eartha Angelia Sieving, MD - 01/12/2024 10:59 AM EDT Formatting of this note might be different from the original. [facility] Patient Name: Domonic Kimball Procedure Date: 01/12/2024 10:59 AM MRN: [id number] Date of Birth: Dec 08, 1950 Attending MD: Sieving Eartha , , [id number] CSN: [id number] Age: 54 Admit Type: Outpatient Procedure: Upper GI endoscopy Indications: Iron deficiency anemia Providers: Sieving Eartha,  Crystal Page, [place] Wilson, Technician, Bascom Blush Referring MD: Medicines: Monitored Anesthesia Care Complications: No immediate complications. Estimated Blood Loss: Estimated blood loss: none. Procedure: Pre-Anesthesia Assessment: - Prior to the procedure, a History and Physical was performed, and patient medications, allergies and sensitivities were reviewed. The patient's tolerance of previous anesthesia was reviewed. - The risks and benefits of the procedure and the sedation options and risks were discussed with the patient. All questions were answered and informed consent was obtained. - ASA Grade Assessment: III - A patient with severe systemic disease. After obtaining informed consent, the endoscope was passed under direct vision. Throughout the procedure, the patient's blood pressure, pulse, and oxygen  saturations were monitored continuously. The GIF-H190 ([id number]) scope was introduced through the mouth, and advanced to the second part of duodenum. The GIF-H190 ([id number]) scope was introduced through the and advanced to the. The upper GI endoscopy was accomplished without difficulty. The patient tolerated the procedure well. Scope In: 11:27:16 AM Scope Out: 11:40:31  AM Total Procedure Duration: 0 hours 13 minutes 15 seconds Findings: The esophagus was normal. One slowly oozing superficial gastric ulcer was found in the gastric antrum. The lesion was 4 mm in largest dimension. Area was successfully injected with 3 mL of a 0.1 mg/mL solution of epinephrine  for hemostasis. For hemostasis, one hemostatic clip was successfully placed (Patrick safe). Clip manufacturer: [employer]. There was no bleeding at the end of the procedure. Biopsies from the edges of the ulcer were taken with a cold forceps for histology. There was presence of two other scars in the antrum. Biopsies from the stomach were taken with a cold forceps for Helicobacter pylori testing. Localized mild  inflammation characterized by congestion (edema) and erythema was found in the first portion of the duodenum. Impression: - Normal esophagus. - Oozing gastric ulcer. Injected. Clip manufacturer: [employer]. Clip (Patrick safe) was placed. Biopsied. - Duodenitis. Moderate Sedation: Per Anesthesia Care Recommendation: - Return patient to hospital ward for ongoing care. - Full liquids today, advance diet as tolerated tomorrow. - Use Protonix  (pantoprazole ) 40 mg PO BID. - Await pathology results. - Daily H/H. - No high dose aspirin , meloxicam , celebrex, ibuprofen, naproxen , or other non-steroidal anti-inflammatory drugs. - Return to GI office in 4 weeks. Procedure Code(s): --- Professional --- [phone], 59, Esophagogastroduodenoscopy, flexible, transoral; with control of bleeding, any method 43239, Esophagogastroduodenoscopy, flexible, transoral; with biopsy, single or multiple Diagnosis Code(s): --- Professional --- K25.4, Chronic or unspecified gastric ulcer with hemorrhage K29.80, Duodenitis without bleeding D50.9, Iron deficiency anemia, unspecified CPT copyright 2022 American Medical Association. All rights reserved. The codes documented in this report are preliminary and upon coder review may be revised to meet current compliance requirements. Patrick Fortune, MD Patrick Aguilar, 01/12/2024 11:53:32 AM This report has been signed electronically. Number of Addenda: 0    Surgical pathology (01/12/2024 11:35 AM EDT) Lab Results - Surgical pathology (01/12/2024 11:35 AM EDT) Component Value Ref Range Test Method Analysis Time Performed At Pathologist Signature  SURGICAL PATHOLOGY SURGICAL PATHOLOGY CASE: [id number] PATIENT: [facility] Surgical Pathology Report     Clinical History: Iron deficiency anemia (jlr)     FINAL MICROSCOPIC DIAGNOSIS:  A. STOMACH, RANDOM, BIOPSY:     Gastric antral / oxyntic mucosa without significant diagnostic alteration.      No H. pylori identified on HE stain.     Negative for intestinal metaplasia or dysplasia.  B. STOMACH ULCER, BIOPSY:     Gastric antral / oxyntic mucosa with chronic inactive gastritis.     No H. pylori identified on HE stain.     Negative for intestinal metaplasia or dysplasia.   GROSS DESCRIPTION:  A. Received in formalin labeled with the patients name and Random gastric biopsies are four 0.1-0.6 cm pieces of tan soft tissue, submitted in toto in a single cassette.  B. Received in formalin labeled with the patients name and Gastric ulcer biopsies are three 0.2 cm pieces of tan soft tissue, submitted in toto in a single cassette.  (LEF 01/12/2024)   Final Diagnosis performed by Pepper Dutton, MD.   Electronically signed 01/15/2024 Technical component performed at [facility], [address], [place], [place] [address]. Professional component performed at [employer]. [facility], [address], [place], [place] [address]. Immunohistochemistry Technical component (if applicable) was performed at [facility] [address], [address], [place], [place] [address].   IMMUNOHISTOCHEMISTRY DISCLAIMER (if applicable): Some of these immunohistochemical stains may have been developed and the performance characteristics determine  by [facility]. Some may not have been cleared or approved by the U.S. Food and Drug Administration. The FDA has determined that such clearance or approval is not necessary. This test is used for clinical purposes. It should not be regarded as investigational or for research. This laboratory is certified under the Clinical Laboratory Improvement Amendments of 1988 (CLIA-88) as qualified to perform high complexity clinical laboratory testing.  The controls stained appropriately.   IHC stains are performed on formalin fixed, paraffin embedded tissue using a 3,3diaminobenzidine (DAB) chromogen and Leica  Bond Autostainer System. The staining intensity of the nucleus is score manually and is reported as the percentage of tumor cell nuclei demonstrating specific nuclear staining. The specimens are fixed in 10% Neutral Formalin for at least 6 hours and up to 72hrs. These tests are validated on decalcified tissue. Results should be interpreted with caution given the possibility of false negative results on decalcified specimens. Antibody Clones are as follows ER-clone 50F, PR-clone 16, Ki67- clone MM1. Some of these immunohistochemical stains may have been developed and the performance characteristics determined by [facility].        Imaging Results - CT Chest Wo Contrast (01/11/2024 12:42 PM EDT) Narrative  01/11/2024 2:41 PM EDT  CLINICAL DATA:  Chest trauma, blunt  EXAM: CT CHEST WITHOUT CONTRAST  TECHNIQUE: Multidetector CT imaging of the chest was performed following the standard protocol without IV contrast.  RADIATION DOSE REDUCTION: This exam was performed according to the departmental dose-optimization program which includes automated exposure control, adjustment of the mA and/or kV according to patient size and/or use of iterative reconstruction technique.  COMPARISON:  01/11/2024  FINDINGS: Cardiovascular: No cardiomegaly. Trace pericardial effusion. No aortic aneurysm. Multi-vessel coronary atherosclerosis. Diffuse calcified atherosclerosis throughout the aorta.  Mediastinum/Nodes: No mediastinal mass. No mediastinal, hilar, or axillary lymphadenopathy.  Lungs/Pleura: The midline trachea and bronchi are patent. Confluent centrilobular and paraseptal emphysema within the upper lobes bilaterally. Fibrolinear scarring predominantly in the subpleural regions throughout the lungs. No focal airspace consolidation, pleural effusion, or pneumothorax. Calcified pleural plaque in the posterior right lung base.  Musculoskeletal: Minimally displaced posterolateral  right seventh rib fracture. Mildly displaced fractures of the posterolateral right eighth and ninth ribs. Multilevel degenerative disc disease of the spine. Posterior fusion hardware at the cervicothoracic junction. Osteopenia. Subcutaneous edema interdigitating within the right chest wall musculature. Small volume symmetric bilateral gynecomastia.  Upper Abdomen: No acute abnormality in the partially visualized upper abdomen.  IMPRESSION: 1. Redemonstrated, mildly displaced rib fractures of the right posterolateral 7-9th ribs. No pneumothorax. 2. Confluent centrilobular and paraseptal emphysema. No superimposed pneumonia or pulmonary edema. No pleural effusion.  Aortic Atherosclerosis (ICD10-I70.0).   Electronically Signed  By: Rogelia Myers M.D.  On: 01/11/2024 14:41     I personally reviewed all notes. No images were available.   01/10/24  He returns for follow up.  He reports he's sleepy today.  01/03/24  He has no complaints.  He had a good [place] and enjoyed a new movie.    Nurses report a new left anterior ankle wound.  12/27/23  He has no complaints today.  12/20/23  He reports his legs have been really jumpy.  It has made it hard for him to sleep recently.  12/13/23  He returns for follow up.  He is enjoying watching his Westerns.  12/06/23  He returns for follow up.  The specialized lift he is getting installed is still in process.  11/29/23  He returns for follow  up.  11/22/23  He returns for follow up.  He reports having hit his leg at his nurse visit last week and it bled a lot.  11/08/23  He presents for follow up.  His family is still working on making some customizations to help with mobilizing him at home.  11/01/23  He presents for follow up.  He couldn't make it last week.   10/18/23  He presents one week after TheraSkin graft to the right posterior leg.  10/11/23  He presents for TheraSkin application.  He reports not sleeping well  last night.  10/04/23  He returns for follow up. He reports a family member who is a Surveyor, [employer] is busily creating something that will help keep his legs off the bed.  He also tells us  we're lucky we're seeing him today since it is so cold out.  09/27/23  He returns for follow up.  09/20/23  He reports he had a good holiday.  He says his new year is off to a good start.  08/30/23  He has no complaints.  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  08/24/23  He presents for follow up. Nurses report bleeding when taking his dressings off.  08/02/23  He present for follow up.  07/26/23  He returns with no complaints.  07/12/23  He returns for follow up. No complaints.  06/28/23  He returns for follow up.  He was seen in the ER last week and felt to have had taken too much of his oxycodone .  He improved with narcan and was discharged home.  06/21/23  Patient returns for follow up.  He was snoozing in the waiting room and in the exam room.  He is difficult to rouse. He says he didn't sleep well and is falling back asleep.  06/14/23  He returns for follow up.  He forgot to bring his booties with him.  06/07/23  He reports he is using his booties. He does not have them with him.  05/31/23  He presents for follow up today.  He reports he's trying to change positions.  05/24/23  He returns for follow up. He has no concerns.  He said he's been propping his legs up.  05/17/23  He returns for follow up.  He reports he isn't able to position himself so his weight isn't on the lateral aspect of the right leg.  05/10/23  He returns for follow up of his right ankle and left toe.  He can't feel either.  04/26/23  He has no complaints.    04/19/23  He has no complaints.  Note is made of a new right lateral malleolus wound.  04/12/23  He has no complaints. He has antibiotics through to 8/2.  04/05/23  He has no complaints.  He is told his hip wound has  healed.  02/15/23  He has no complaints.  01/04/23  He returns for follow up.    11/23/22  He returns for follow up.  He has no complaints.  10/19/22  He returns for follow up.  Reports that he has a new wound of the left great toe.  He is finishing up a 7 day course of bactrim .  09/28/22  He returns for follow up.  He is concerned that his home health has stopped.  He would like to renew it.  09/07/22  He returns for follow up.  He has no complaints.  08/10/22  He returns for follow up.  He had a great  Thanksgiving.  07/13/22  He returns for follow up. No complaints.  06/15/22  He returns for follow up.  He reports he's hanging in there and is told it is getting smaller.   04/27/22  He returns today.  No complaints.   04/13/22  He returns today and reports he has a new buttock wound.  03/30/22  Patient reports no complaints but nursing is concerned about an odor and an area of bruising.   03/16/22  He returns for follow up.  Home health is doing his dressing changes and he is trying to stay off of it.  03/02/22  He returns for follow up. He has no complaints.  02/16/22  He returns for follow up.  He has no complaints. Home health has been doing his dressing changes.   Objective:    There were no vitals filed for this visit.   General:  well appearing  Pulmonary: CTAB, no wheezes, rhonci, crackles  CV:   RRR, S1,S2, no murmurs, gallops,rubs  GI: soft, bowel sounds active, non-tender  Neuro:    AAO x 3  Psych : Normal mood and judgement  Wound:  Left great toe: healed   Second toe wound: healed   Left ankle: 0.5 x 0.8 x 0.8 cm      Left lateral and medial malleolus: Healed  Left posterior leg: 1 x 0.8 cm    0.2 x 0.7 cm    Right lateral malleolus healed  Right distal leg: 3.3 x 1.2  Right proximal: 2 x 0.6 and 0.2 x 0.2 cm

## 2024-04-25 ENCOUNTER — Other Ambulatory Visit (HOSPITAL_COMMUNITY): Payer: Self-pay | Admitting: *Deleted

## 2024-04-26 ENCOUNTER — Encounter (HOSPITAL_COMMUNITY)
Admission: RE | Admit: 2024-04-26 | Discharge: 2024-04-26 | Disposition: A | Discharge status: Home / Self Care | Source: Ambulatory Visit | Attending: Infectious Disease | Admitting: Infectious Disease

## 2024-04-26 ENCOUNTER — Ambulatory Visit (HOSPITAL_COMMUNITY)
Admission: RE | Admit: 2024-04-26 | Discharge: 2024-04-26 | Disposition: A | Discharge status: Home / Self Care | Source: Ambulatory Visit | Attending: Infectious Disease | Admitting: Infectious Disease

## 2024-04-26 DIAGNOSIS — E1142 Type 2 diabetes mellitus with diabetic polyneuropathy: Secondary | ICD-10-CM | POA: Diagnosis not present

## 2024-04-26 DIAGNOSIS — J449 Chronic obstructive pulmonary disease, unspecified: Secondary | ICD-10-CM | POA: Diagnosis not present

## 2024-04-26 DIAGNOSIS — L89892 Pressure ulcer of other site, stage 2: Secondary | ICD-10-CM | POA: Diagnosis not present

## 2024-04-26 DIAGNOSIS — L89512 Pressure ulcer of right ankle, stage 2: Secondary | ICD-10-CM | POA: Diagnosis not present

## 2024-04-26 DIAGNOSIS — G8220 Paraplegia, unspecified: Secondary | ICD-10-CM | POA: Diagnosis not present

## 2024-04-26 DIAGNOSIS — M86672 Other chronic osteomyelitis, left ankle and foot: Secondary | ICD-10-CM | POA: Diagnosis not present

## 2024-04-26 DIAGNOSIS — M86172 Other acute osteomyelitis, left ankle and foot: Secondary | ICD-10-CM | POA: Diagnosis not present

## 2024-04-26 DIAGNOSIS — I119 Hypertensive heart disease without heart failure: Secondary | ICD-10-CM | POA: Diagnosis not present

## 2024-04-26 MED ORDER — LIDOCAINE HCL 1 % IJ SOLN
20.0000 mL | Freq: Once | INTRAMUSCULAR | Status: AC
  Administered 2024-04-26: 10 mL

## 2024-04-26 MED ORDER — HEPARIN SOD (PORK) LOCK FLUSH 100 UNIT/ML IV SOLN
INTRAVENOUS | Status: AC
  Filled 2024-04-26: qty 5

## 2024-04-26 MED ORDER — HEPARIN SOD (PORK) LOCK FLUSH 100 UNIT/ML IV SOLN
250.0000 [IU] | INTRAVENOUS | Status: AC | PRN
  Administered 2024-04-26: 250 [IU]

## 2024-04-26 MED ORDER — LIDOCAINE HCL 1 % IJ SOLN
INTRAMUSCULAR | Status: AC
  Filled 2024-04-26: qty 20

## 2024-04-26 MED ORDER — SODIUM CHLORIDE 0.9 % IV SOLN
850.0000 mg | Freq: Once | INTRAVENOUS | Status: AC
  Administered 2024-04-26: 900 mg via INTRAVENOUS
  Filled 2024-04-26: qty 18

## 2024-04-26 NOTE — Procedures (Signed)
 PROCEDURE SUMMARY:  Successful placement of single lumen PICC line to right brachial vein. Length 35 cm Tip at lower SVC/RA No complications PICC capped Ready for use. EBL = trace  Please see full dictation in Imaging section for details.   Tieara Flitton NP 04/26/2024 9:47 AM

## 2024-04-27 DIAGNOSIS — M86172 Other acute osteomyelitis, left ankle and foot: Secondary | ICD-10-CM | POA: Diagnosis not present

## 2024-04-29 DIAGNOSIS — L89512 Pressure ulcer of right ankle, stage 2: Secondary | ICD-10-CM | POA: Diagnosis not present

## 2024-04-29 DIAGNOSIS — G8220 Paraplegia, unspecified: Secondary | ICD-10-CM | POA: Diagnosis not present

## 2024-04-29 DIAGNOSIS — I119 Hypertensive heart disease without heart failure: Secondary | ICD-10-CM | POA: Diagnosis not present

## 2024-04-29 DIAGNOSIS — J449 Chronic obstructive pulmonary disease, unspecified: Secondary | ICD-10-CM | POA: Diagnosis not present

## 2024-04-29 DIAGNOSIS — L89892 Pressure ulcer of other site, stage 2: Secondary | ICD-10-CM | POA: Diagnosis not present

## 2024-04-29 DIAGNOSIS — E1142 Type 2 diabetes mellitus with diabetic polyneuropathy: Secondary | ICD-10-CM | POA: Diagnosis not present

## 2024-04-30 DIAGNOSIS — R7881 Bacteremia: Secondary | ICD-10-CM | POA: Diagnosis not present

## 2024-04-30 DIAGNOSIS — M86172 Other acute osteomyelitis, left ankle and foot: Secondary | ICD-10-CM | POA: Diagnosis not present

## 2024-04-30 DIAGNOSIS — B9562 Methicillin resistant Staphylococcus aureus infection as the cause of diseases classified elsewhere: Secondary | ICD-10-CM | POA: Diagnosis not present

## 2024-05-01 DIAGNOSIS — M86072 Acute hematogenous osteomyelitis, left ankle and foot: Secondary | ICD-10-CM | POA: Diagnosis not present

## 2024-05-01 DIAGNOSIS — X58XXXA Exposure to other specified factors, initial encounter: Secondary | ICD-10-CM | POA: Diagnosis not present

## 2024-05-01 DIAGNOSIS — S81801S Unspecified open wound, right lower leg, sequela: Secondary | ICD-10-CM | POA: Diagnosis not present

## 2024-05-01 DIAGNOSIS — S91302D Unspecified open wound, left foot, subsequent encounter: Secondary | ICD-10-CM | POA: Diagnosis not present

## 2024-05-01 DIAGNOSIS — S81801A Unspecified open wound, right lower leg, initial encounter: Secondary | ICD-10-CM | POA: Diagnosis not present

## 2024-05-01 DIAGNOSIS — S81802A Unspecified open wound, left lower leg, initial encounter: Secondary | ICD-10-CM | POA: Diagnosis not present

## 2024-05-01 DIAGNOSIS — S81801D Unspecified open wound, right lower leg, subsequent encounter: Secondary | ICD-10-CM | POA: Diagnosis not present

## 2024-05-01 DIAGNOSIS — S81802S Unspecified open wound, left lower leg, sequela: Secondary | ICD-10-CM | POA: Diagnosis not present

## 2024-05-02 DIAGNOSIS — M86172 Other acute osteomyelitis, left ankle and foot: Secondary | ICD-10-CM | POA: Diagnosis not present

## 2024-05-03 DIAGNOSIS — G8220 Paraplegia, unspecified: Secondary | ICD-10-CM | POA: Diagnosis not present

## 2024-05-03 DIAGNOSIS — L89892 Pressure ulcer of other site, stage 2: Secondary | ICD-10-CM | POA: Diagnosis not present

## 2024-05-03 DIAGNOSIS — J449 Chronic obstructive pulmonary disease, unspecified: Secondary | ICD-10-CM | POA: Diagnosis not present

## 2024-05-03 DIAGNOSIS — I119 Hypertensive heart disease without heart failure: Secondary | ICD-10-CM | POA: Diagnosis not present

## 2024-05-03 DIAGNOSIS — E1142 Type 2 diabetes mellitus with diabetic polyneuropathy: Secondary | ICD-10-CM | POA: Diagnosis not present

## 2024-05-03 DIAGNOSIS — L89512 Pressure ulcer of right ankle, stage 2: Secondary | ICD-10-CM | POA: Diagnosis not present

## 2024-05-06 DIAGNOSIS — L89892 Pressure ulcer of other site, stage 2: Secondary | ICD-10-CM | POA: Diagnosis not present

## 2024-05-06 DIAGNOSIS — I119 Hypertensive heart disease without heart failure: Secondary | ICD-10-CM | POA: Diagnosis not present

## 2024-05-06 DIAGNOSIS — E1142 Type 2 diabetes mellitus with diabetic polyneuropathy: Secondary | ICD-10-CM | POA: Diagnosis not present

## 2024-05-06 DIAGNOSIS — J449 Chronic obstructive pulmonary disease, unspecified: Secondary | ICD-10-CM | POA: Diagnosis not present

## 2024-05-06 DIAGNOSIS — G8220 Paraplegia, unspecified: Secondary | ICD-10-CM | POA: Diagnosis not present

## 2024-05-06 DIAGNOSIS — L89512 Pressure ulcer of right ankle, stage 2: Secondary | ICD-10-CM | POA: Diagnosis not present

## 2024-05-07 DIAGNOSIS — B9562 Methicillin resistant Staphylococcus aureus infection as the cause of diseases classified elsewhere: Secondary | ICD-10-CM | POA: Diagnosis not present

## 2024-05-07 DIAGNOSIS — M86172 Other acute osteomyelitis, left ankle and foot: Secondary | ICD-10-CM | POA: Diagnosis not present

## 2024-05-08 ENCOUNTER — Telehealth: Payer: Self-pay

## 2024-05-08 DIAGNOSIS — S81802A Unspecified open wound, left lower leg, initial encounter: Secondary | ICD-10-CM | POA: Diagnosis not present

## 2024-05-08 DIAGNOSIS — S81801S Unspecified open wound, right lower leg, sequela: Secondary | ICD-10-CM | POA: Diagnosis not present

## 2024-05-08 DIAGNOSIS — S91302D Unspecified open wound, left foot, subsequent encounter: Secondary | ICD-10-CM | POA: Diagnosis not present

## 2024-05-08 DIAGNOSIS — S81801D Unspecified open wound, right lower leg, subsequent encounter: Secondary | ICD-10-CM | POA: Diagnosis not present

## 2024-05-08 DIAGNOSIS — S81802S Unspecified open wound, left lower leg, sequela: Secondary | ICD-10-CM | POA: Diagnosis not present

## 2024-05-08 DIAGNOSIS — X58XXXA Exposure to other specified factors, initial encounter: Secondary | ICD-10-CM | POA: Diagnosis not present

## 2024-05-08 DIAGNOSIS — M86072 Acute hematogenous osteomyelitis, left ankle and foot: Secondary | ICD-10-CM | POA: Diagnosis not present

## 2024-05-08 DIAGNOSIS — S81801A Unspecified open wound, right lower leg, initial encounter: Secondary | ICD-10-CM | POA: Diagnosis not present

## 2024-05-08 LAB — LAB REPORT - SCANNED: EGFR: 39

## 2024-05-08 NOTE — Telephone Encounter (Signed)
 We will call him and see if he is still taking his Crestor. If he is, we will have him stop and then order some IVF and recheck CK in a few days. If he isn't taking it anymore, do you want to give fluids and recheck or go ahead and switch to vanc? I don't love the vanc idea due to his age and SCr but not sure what other options we have.

## 2024-05-08 NOTE — Telephone Encounter (Signed)
 Ok we will and order fluids/recheck in a few days. His end date is 9/29.

## 2024-05-08 NOTE — Telephone Encounter (Signed)
 Messaged [place], Pharmacist at [facility], and gave order for 1 liter of fluids and CK recheck. He has tried to call patient several times today with no response (we have too). He will update me once he is able to get fluids into home.

## 2024-05-08 NOTE — Telephone Encounter (Signed)
 Sorry Elma Breed.. meant to send to the other [facility]!

## 2024-05-08 NOTE — Telephone Encounter (Signed)
 Received call from [place], RN from [facility] about elevated CK. 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 Labs from 8/19 show CK of 153 and SCr of 1.66; updated labs from 8/26 show CK of 890 and SCr of 1.88. Will route to Dr. Fleeta Rothman for advice on how to proceed.   Elma Fail, PharmD PGY1 Clinical Pharmacist [facility]  05/08/2024 9:16 AM

## 2024-05-08 NOTE — Telephone Encounter (Signed)
 Attempted to reach patient to discuss lab results (elevated CK) and update him on the plan. Left voicemail for patient to call us  back to discuss.  Elma Fail, PharmD PGY1 Clinical Pharmacist [facility]  05/08/2024 1:31 PM

## 2024-05-09 DIAGNOSIS — M86172 Other acute osteomyelitis, left ankle and foot: Secondary | ICD-10-CM | POA: Diagnosis not present

## 2024-05-10 DIAGNOSIS — I119 Hypertensive heart disease without heart failure: Secondary | ICD-10-CM | POA: Diagnosis not present

## 2024-05-10 DIAGNOSIS — L89512 Pressure ulcer of right ankle, stage 2: Secondary | ICD-10-CM | POA: Diagnosis not present

## 2024-05-10 DIAGNOSIS — L89892 Pressure ulcer of other site, stage 2: Secondary | ICD-10-CM | POA: Diagnosis not present

## 2024-05-10 DIAGNOSIS — J449 Chronic obstructive pulmonary disease, unspecified: Secondary | ICD-10-CM | POA: Diagnosis not present

## 2024-05-10 DIAGNOSIS — M86172 Other acute osteomyelitis, left ankle and foot: Secondary | ICD-10-CM | POA: Diagnosis not present

## 2024-05-10 DIAGNOSIS — E1142 Type 2 diabetes mellitus with diabetic polyneuropathy: Secondary | ICD-10-CM | POA: Diagnosis not present

## 2024-05-10 DIAGNOSIS — G8220 Paraplegia, unspecified: Secondary | ICD-10-CM | POA: Diagnosis not present

## 2024-05-10 NOTE — Telephone Encounter (Signed)
 Spoke with [facility] this morning again. They have not been able to get in contact with patient to confirm he can infuse fluids. Asked him to have nursing go out today to redraw CK and give fluids while they are there. Hopefully, this can be done before the long weekend.

## 2024-05-13 DIAGNOSIS — E1142 Type 2 diabetes mellitus with diabetic polyneuropathy: Secondary | ICD-10-CM | POA: Diagnosis not present

## 2024-05-13 DIAGNOSIS — L89512 Pressure ulcer of right ankle, stage 2: Secondary | ICD-10-CM | POA: Diagnosis not present

## 2024-05-13 DIAGNOSIS — G8220 Paraplegia, unspecified: Secondary | ICD-10-CM | POA: Diagnosis not present

## 2024-05-13 DIAGNOSIS — L89892 Pressure ulcer of other site, stage 2: Secondary | ICD-10-CM | POA: Diagnosis not present

## 2024-05-13 DIAGNOSIS — J449 Chronic obstructive pulmonary disease, unspecified: Secondary | ICD-10-CM | POA: Diagnosis not present

## 2024-05-13 DIAGNOSIS — I119 Hypertensive heart disease without heart failure: Secondary | ICD-10-CM | POA: Diagnosis not present

## 2024-05-14 DIAGNOSIS — B9562 Methicillin resistant Staphylococcus aureus infection as the cause of diseases classified elsewhere: Secondary | ICD-10-CM | POA: Diagnosis not present

## 2024-05-14 DIAGNOSIS — M86172 Other acute osteomyelitis, left ankle and foot: Secondary | ICD-10-CM | POA: Diagnosis not present

## 2024-05-15 DIAGNOSIS — M869 Osteomyelitis, unspecified: Secondary | ICD-10-CM | POA: Diagnosis not present

## 2024-05-15 DIAGNOSIS — M86072 Acute hematogenous osteomyelitis, left ankle and foot: Secondary | ICD-10-CM | POA: Diagnosis not present

## 2024-05-15 DIAGNOSIS — S81802S Unspecified open wound, left lower leg, sequela: Secondary | ICD-10-CM | POA: Diagnosis not present

## 2024-05-15 DIAGNOSIS — S81801A Unspecified open wound, right lower leg, initial encounter: Secondary | ICD-10-CM | POA: Diagnosis not present

## 2024-05-15 DIAGNOSIS — S81801D Unspecified open wound, right lower leg, subsequent encounter: Secondary | ICD-10-CM | POA: Diagnosis not present

## 2024-05-15 DIAGNOSIS — S81801S Unspecified open wound, right lower leg, sequela: Secondary | ICD-10-CM | POA: Diagnosis not present

## 2024-05-17 DIAGNOSIS — E1142 Type 2 diabetes mellitus with diabetic polyneuropathy: Secondary | ICD-10-CM | POA: Diagnosis not present

## 2024-05-17 DIAGNOSIS — G8220 Paraplegia, unspecified: Secondary | ICD-10-CM | POA: Diagnosis not present

## 2024-05-17 DIAGNOSIS — I119 Hypertensive heart disease without heart failure: Secondary | ICD-10-CM | POA: Diagnosis not present

## 2024-05-17 DIAGNOSIS — J449 Chronic obstructive pulmonary disease, unspecified: Secondary | ICD-10-CM | POA: Diagnosis not present

## 2024-05-17 DIAGNOSIS — L89892 Pressure ulcer of other site, stage 2: Secondary | ICD-10-CM | POA: Diagnosis not present

## 2024-05-17 DIAGNOSIS — L89512 Pressure ulcer of right ankle, stage 2: Secondary | ICD-10-CM | POA: Diagnosis not present

## 2024-05-17 DIAGNOSIS — M86172 Other acute osteomyelitis, left ankle and foot: Secondary | ICD-10-CM | POA: Diagnosis not present

## 2024-05-17 NOTE — Telephone Encounter (Signed)
 9/2 labs - SCr slightly decreased to 1.78. CK apparently was not drawn which is super frustrating. I am reaching back out to [facility].

## 2024-05-18 DIAGNOSIS — M86172 Other acute osteomyelitis, left ankle and foot: Secondary | ICD-10-CM | POA: Diagnosis not present

## 2024-05-20 ENCOUNTER — Telehealth: Payer: Self-pay | Admitting: Pharmacist

## 2024-05-20 ENCOUNTER — Other Ambulatory Visit (HOSPITAL_COMMUNITY): Payer: Self-pay

## 2024-05-20 DIAGNOSIS — G8220 Paraplegia, unspecified: Secondary | ICD-10-CM | POA: Diagnosis not present

## 2024-05-20 DIAGNOSIS — J449 Chronic obstructive pulmonary disease, unspecified: Secondary | ICD-10-CM | POA: Diagnosis not present

## 2024-05-20 DIAGNOSIS — I119 Hypertensive heart disease without heart failure: Secondary | ICD-10-CM | POA: Diagnosis not present

## 2024-05-20 DIAGNOSIS — L89512 Pressure ulcer of right ankle, stage 2: Secondary | ICD-10-CM | POA: Diagnosis not present

## 2024-05-20 DIAGNOSIS — E1142 Type 2 diabetes mellitus with diabetic polyneuropathy: Secondary | ICD-10-CM | POA: Diagnosis not present

## 2024-05-20 DIAGNOSIS — M86172 Other acute osteomyelitis, left ankle and foot: Secondary | ICD-10-CM | POA: Diagnosis not present

## 2024-05-20 DIAGNOSIS — L89892 Pressure ulcer of other site, stage 2: Secondary | ICD-10-CM | POA: Diagnosis not present

## 2024-05-20 MED ORDER — LINEZOLID 600 MG PO TABS
600.0000 mg | ORAL_TABLET | Freq: Two times a day (BID) | ORAL | 0 refills | Status: AC
Start: 2024-05-20 — End: ?

## 2024-05-20 NOTE — Telephone Encounter (Signed)
 Received call this morning regarding patient's CK levels. He is on daptomycin  for osteo until 9/29. We have been monitoring his levels closely and the one drawn on Friday 9/5 was 2,665.   Discussed with Dr. Fleeta Rothman. Will stop daptomycin  today. Options are switching to vancomycin  or oral linezolid . Called [facility] to discuss. He is not having any symptoms associated with high CKs (dark color urine, muscle weakness, myopathies, etc). He was interested in pursing vancomycin  but once I mentioned the issues with his kidneys, he decided to do linezolid  x 3 weeks until his end of therapy. It will cost him $100 through his insurance and he was agreeable to that. Advised to take one pill twice daily. He called [name] and they have it in stock, so he will have someone go to pick it up today. Reminded him of his appointment with Dr. Fleeta Rothman on 9/23. He will call with any questions or concerns.  Called [name] and talked to [place], pharmacist. Gave verbal order to stop daptomycin  and pull PICC line today. He verbalized understanding.   Patrick Aguilar, PharmD, BCIDP, AAHIVP, CPP Infectious Diseases Clinical Pharmacist Practitioner Clinical Pharmacist Lead, [facility]

## 2024-05-21 ENCOUNTER — Ambulatory Visit: Admitting: Gastroenterology

## 2024-05-21 ENCOUNTER — Encounter: Payer: Self-pay | Admitting: *Deleted

## 2024-05-21 ENCOUNTER — Other Ambulatory Visit: Payer: Self-pay | Admitting: *Deleted

## 2024-05-21 ENCOUNTER — Encounter: Payer: Self-pay | Admitting: Gastroenterology

## 2024-05-21 VITALS — BP 129/79 | HR 69 | Temp 97.6°F | Ht 70.0 in

## 2024-05-21 DIAGNOSIS — K254 Chronic or unspecified gastric ulcer with hemorrhage: Secondary | ICD-10-CM

## 2024-05-21 DIAGNOSIS — Z8711 Personal history of peptic ulcer disease: Secondary | ICD-10-CM

## 2024-05-21 DIAGNOSIS — Z860100 Personal history of colon polyps, unspecified: Secondary | ICD-10-CM

## 2024-05-21 DIAGNOSIS — K219 Gastro-esophageal reflux disease without esophagitis: Secondary | ICD-10-CM

## 2024-05-21 DIAGNOSIS — K5900 Constipation, unspecified: Secondary | ICD-10-CM

## 2024-05-21 DIAGNOSIS — D649 Anemia, unspecified: Secondary | ICD-10-CM

## 2024-05-21 DIAGNOSIS — D509 Iron deficiency anemia, unspecified: Secondary | ICD-10-CM | POA: Diagnosis not present

## 2024-05-21 MED ORDER — PEG 3350-KCL-NA BICARB-NACL 420 G PO SOLR: 4000.0000 mL | Freq: Once | ORAL | 0 refills | Status: AC

## 2024-05-21 NOTE — Progress Notes (Signed)
 GI Office Note    Referring Provider: Maree Isles, MD Primary Care Physician:  Maree Isles, MD Primary Gastroenterologist: Toribio Rubins, MD  Date:  05/21/2024  ID:  Patrick Aguilar, DOB 03-Oct-1950, MRN [id number]   Chief Complaint   Chief Complaint  Patient presents with   Follow-up   History of Present Illness  Patrick Aguilar is a 73 y.o. male with a history of COPD, diabetes, HTN, paraplegia c/b multiple pressure injuries presenting today for follow up of anemia.   Colonoscopy September 2010: - 5 mm sessile rectal polyp -serrated adenoma -Scattered diverticulosis in the left colon -Tortuous and redundant colon found -Observed TI normal  -Normal mucosa on retroflexion -Advised colonoscopy in 10 years with high-fiber diet and Hemoccult x 3 yearly  Patient seen during hospitalization 5/2 for significant anemia.  Presented with hemoglobin of 7.1 with MCV 66, low iron saturation of 5% and ferritin of 14.  He had normal B12 and folate.  He was also having right-sided chest pain at the time and was found to have a right sided rib fracture.  He denied any melena or BRBPR at home.  Also denied any coffee-ground emesis, hematemesis, abdominal pain, shortness of breath until caregiver fell on him.  Has had some chronic fatigue and was noted to be on Celebrex and 81 mg aspirin .  He was transfused with a unit of blood and underwent upper endoscopy.  Was started on PPI IV inpatient.  He had previously reported an outpatient colonoscopy was performed at [facility] but was unsure of timing.   EGD 01/12/2024: - Normal esophagus - Oozing gastric ulcer which was injected and clipped s/p biopsy - Duodenitis - Diet was slowly advanced.  Recommended avoiding all NSAIDs. - Pathology negative for H. pylori but with chronic inactive gastritis.  Advised no need for repeat EGD.  Last office visit 02/13/24.  Taking iron daily with breakfast and also still taking Celebrex.  Tylenol  does  not work for his pain and also taking a baby aspirin .  Denies chest pain or shortness of breath.  Denies melena be regular.  Having some occasional constipation takes MiraLAX  daily.  Had taken double dose of MiraLAX  morning of appointment given no bowel movement in 2 days.  Had cut back on his MiraLAX  recently given he was having some stools that were more loose in nature.  Having some rib pain but otherwise feeling well.  Advised to check CBC and iron panel, consider colonoscopy pending timing of his last procedure.  Continue PPI twice daily.  Continue daily iron.  Continue MiraLAX  and add Metamucil daily.  Continue to advised cessation of Celebrex.  Colonoscopy August 2020: - Redundant sigmoid colon. Manual abdominal counterpressure was used to reach the cecum -Mild nonbleeding diverticulosis sigmoid colon -Normal retroflexion. -Advised repeat colonoscopy in 5 years  After reviewing his prior records in June I recommended that he have a follow-up colonoscopy given it had been 5 years.     Latest Ref Rng & Units 04/22/2024   10:48 AM 02/14/2024   11:27 AM 01/13/2024    4:51 AM  CBC  WBC 3.8 - 10.8 Thousand/uL 11.8  11.4  10.7   Hemoglobin 13.2 - 17.1 g/dL 88.1  89.5  7.8   Hematocrit 38.5 - 50.0 % 38.6  35.5  27.5   Platelets 140 - 400 Thousand/uL 324  280  340    Iron/TIBC/Ferritin/ %Sat    Component Value Date/Time   IRON 97 02/14/2024 1127   TIBC 265  02/14/2024 1127   FERRITIN 37 02/14/2024 1127   IRONPCTSAT 37 02/14/2024 1127   Patient ultimately was hard to get in touch with I was contacted multiple times to schedule colonoscopy.  Patient's daughter was contacted as well to get him scheduled and she also is having trouble getting in contact with him but stated she would try and get him to call the office.  Today:  Discussed the use of AI scribe software for clinical note transcription with the patient, who gave verbal consent to proceed.  He has a history of polyps found on  colonoscopy in September 2010, and his last colonoscopy in August 2020 was normal. There have been difficulties in scheduling a repeat colonoscopy due to communication issues.  He takes Miralax  almost daily for bowel movements but had to take a double dose recently after not having a bowel movement for two days. He has been taking pantoprazole  twice daily for GERD and gastric ulcers. His hemoglobin levels have improved since June. No recent melena, bright red blood per rectum, abdominal pain, chest pain, dizziness, or shortness of breath, nausea, vomiting, dysphagia.   He was switched from IV antibiotics to oral antibiotics because the PICC line or antibiotics were causing kidney issues. His feet were improving with the IV antibiotics. He experiences difficulty with the colonoscopy prep due to mobility issues and requires assistance from his caregiver, Cyndee, to manage bowel movements. He is concerned about the logistics of the prep process due to his limited mobility.  His bowel movements are softer but not loose, and he goes at least once a day, sometimes more. He continues to take iron supplements. No new chest pain, shortness of breath, trouble eating, nausea, or vomiting since the last visit. He experiences baseline shortness of breath due to inactivity but nothing out of the ordinary.      Wt Readings from Last 5 Encounters:  02/13/24 196 lb (88.9 kg)  01/11/24 196 lb 10.4 oz (89.2 kg)  09/07/22 182 lb 15.7 oz (83 kg)  10/12/21 183 lb (83 kg)  08/16/21 183 lb 3.2 oz (83.1 kg)    Current Outpatient Medications  Medication Sig Dispense Refill   acetaminophen  (TYLENOL ) 325 MG tablet Take 2 tablets (650 mg total) by mouth every 6 (six) hours as needed for mild pain (pain score 1-3) (or Fever >/= 101).     aspirin  EC 81 MG tablet Take 1 tablet (81 mg total) by mouth daily with breakfast. 30 tablet 11   atenolol  (TENORMIN ) 50 MG tablet Take 1 tablet (50 mg total) by mouth daily. 30 tablet 5    atenolol -chlorthalidone  (TENORETIC ) 50-25 MG tablet Take 1 tablet by mouth daily.     baclofen  (LIORESAL ) 10 MG tablet Take 10 mg by mouth 2 (two) times daily.     blood glucose meter kit and supplies KIT Dispense based on patient and insurance preference. Use up to four times daily as directed. (FOR ICD-9 250.00, 250.01). 1 each 0   doxazosin  (CARDURA  XL) 8 MG 24 hr tablet Take 8 mg by mouth daily with breakfast.     famotidine  (PEPCID ) 20 MG tablet Take 20 mg by mouth daily.     ferrous sulfate  325 (65 FE) MG tablet Take 1 tablet (325 mg total) by mouth daily with breakfast. 30 tablet 1   Fexofenadine HCl (ALLERGY 24-HR PO) Take by mouth.     furosemide  (LASIX ) 20 MG tablet Take 20 mg by mouth daily after breakfast.      gabapentin  (  NEURONTIN ) 800 MG tablet Take 1 tablet (800 mg total) by mouth See admin instructions. Take 1 tablet in the morning and 3 tablets every evening. 120 tablet 1   glipiZIDE  (GLUCOTROL ) 5 MG tablet Take 5 mg by mouth 2 (two) times daily before a meal.     linezolid  (ZYVOX ) 600 MG tablet Take 1 tablet (600 mg total) by mouth 2 (two) times daily. 42 tablet 0   metFORMIN  (GLUCOPHAGE ) 500 MG tablet Take 500 mg by mouth 2 (two) times daily with a meal. Take 1/2 taingblet in the morning and 1/2 every evening     oxyCODONE  (ROXICODONE ) 15 MG immediate release tablet Take 15 mg by mouth every 8 (eight) hours as needed.     pantoprazole  (PROTONIX ) 40 MG tablet Take 1 tablet (40 mg total) by mouth 2 (two) times daily before a meal. 180 tablet 3   potassium chloride  SA (KLOR-CON  M) 20 MEQ tablet Take 20 mEq by mouth 2 (two) times daily.     rosuvastatin (CRESTOR) 5 MG tablet Take 5 mg by mouth once a week. Every Sunday     traZODone  (DESYREL ) 100 MG tablet Take 100 mg by mouth See admin instructions. 2 tablets at bedtime     No current facility-administered medications for this visit.    Past Medical History:  Diagnosis Date   Acute osteomyelitis of left talus (HCC) 04/21/2024    AKI (acute kidney injury) (HCC) 12/2017   Angio-edema    Anxiety    Arthritis    BPH (benign prostatic hyperplasia)    Complication of anesthesia    low respirations, low BP   COPD (chronic obstructive pulmonary disease) (HCC)    DDD (degenerative disc disease), lumbar    Diabetes mellitus without complication (HCC)    type 2   Diastolic dysfunction 12/14/2017   Moderate noted on ECHO   Fournier's gangrene in male    GERD (gastroesophageal reflux disease)    History of ARDS    History of necrotizing fasciitis    Severe   Hypertension    Lumbar spondylosis    Mixed hyperlipidemia    Numbness and tingling of both upper extremities    Paraplegia [facility])    January 2023   PE (pulmonary thromboembolism) (HCC)    Pulmonary hypertension (HCC) 12/14/2017   Mild, noted on ECHO   Recurrent upper respiratory infection (URI)    Respiratory failure, acute (HCC) 12/2017   Septic shock (HCC) 12/2017   Shortness of breath dyspnea    Testicular pain, left    Wears glasses     Past Surgical History:  Procedure Laterality Date   ADENOIDECTOMY     APPENDECTOMY     APPLICATION OF A-CELL OF CHEST/ABDOMEN N/A 01/08/2018   Procedure: APPLICATION OF A-CELL OF GROIN;  Surgeon: Lowery Estefana RAMAN, DO;  Location: [facility] OR;  Service: Plastics;  Laterality: N/A;   APPLICATION OF A-CELL OF EXTREMITY N/A 12/25/2017   Procedure: APPLICATION OF A-CELL;  Surgeon: Lowery Estefana RAMAN, DO;  Location: [facility];  Service: Plastics;  Laterality: N/A;   BACK SURGERY     x5   CARDIAC CATHETERIZATION  2006   neg   CERVICAL DISC SURGERY     x2   COLONOSCOPY     DEBRIDEMENT AND CLOSURE WOUND N/A 01/26/2018   Procedure: REVISION OF PERINEUM WOUND WITH DEBRIDEMENT, PARTIAL CLOSURE OF PERINEUM, PLACEMENT OF CELLERATE COLLAGEN;  Surgeon: Lowery Estefana RAMAN, DO;  Location: [facility];  Service: Plastics;  Laterality: N/A;   DENTAL  SURGERY     teeth extractions   ESOPHAGOGASTRODUODENOSCOPY N/A 01/12/2024   Procedure:  EGD (ESOPHAGOGASTRODUODENOSCOPY);  Surgeon: Eartha Flavors, Toribio, MD;  Location: [facility];  Service: Gastroenterology;  Laterality: N/A;   groin wound  01/08/2018   : EXCISION OF GROIN WOUND WITH PLACEMENT OF ACELL, AND PRIMARY WOUND CLOSURE (N/A Scrotum)   I & D EXTREMITY N/A 03/12/2018   Procedure: IRRIGATION AND DEBRIDEMENT PERIMUM WOUND WITH CLOSURE;  Surgeon: Lowery Estefana RAMAN, DO;  Location: [facility] OR;  Service: Plastics;  Laterality: N/A;   INCISION AND DRAINAGE ABSCESS Left 07/11/2018   Procedure: INCISION AND DRAINAGE ABSCESS LEFT THIGH;  Surgeon: Matilda Senior, MD;  Location: [facility];  Service: Urology;  Laterality: Left;   INCISION AND DRAINAGE OF WOUND N/A 12/25/2017   Procedure: Irrigation and debridement of Fournier's of scrotum with placement of testes in subcutaneous thigh pockets and Acell placement;  Surgeon: Lowery Estefana RAMAN, DO;  Location: [facility];  Service: Plastics;  Laterality: N/A;   INCISION AND DRAINAGE OF WOUND N/A 01/08/2018   Procedure: EXCISION OF GROIN WOUND WITH PLACEMENT OF ACELL, AND PRIMARY WOUND CLOSURE;  Surgeon: Lowery Estefana RAMAN, DO;  Location: [facility] OR;  Service: Plastics;  Laterality: N/A;   ORCHIECTOMY N/A 12/13/2017   Procedure: EXCISION OF SCROTUM AND DEBRIDEMENT OF PENIS;  Surgeon: Matilda Senior, MD;  Location: [facility];  Service: Urology;  Laterality: N/A;   ORCHIECTOMY Left 06/22/2018   Procedure: ORCHIECTOMY;  Surgeon: Matilda Senior, MD;  Location: [facility];  Service: Urology;  Laterality: Left;   PLANTAR FASCIA SURGERY Bilateral    shoulders Bilateral    rotator cuff   SUBMANDIBULAR GLAND EXCISION Left 03/12/2015   Procedure: LEFT SUBMANDIBULAR GLAND RESECTION;  Surgeon: Vaughan Ricker, MD;  Location: [facility] OR;  Service: ENT;  Laterality: Left;   TONSILLECTOMY     TOTAL HIP ARTHROPLASTY Right 03/06/2019   Procedure: TOTAL HIP ARTHROPLASTY ANTERIOR APPROACH;  Surgeon: Melodi Lerner, MD;  Location: [facility];   Service: Orthopedics;  Laterality: Right;     Family History  Problem Relation Age of Onset   Lung cancer Mother    Bladder Cancer Father    Allergic rhinitis Neg Hx    Asthma Neg Hx    Eczema Neg Hx    Urticaria Neg Hx     Allergies as of 05/21/2024 - Review Complete 05/21/2024  Allergen Reaction Noted   Celecoxib Other (See Comments) 02/08/2022   Tramadol  Other (See Comments) 11/11/2016   Adenosine  11/11/2016   Bupropion  11/11/2016   Morphine  and codeine Itching 03/04/2015    Social History   Socioeconomic History   Marital status: Single    Spouse name: Not on file   Number of children: Not on file   Years of education: Not on file   Highest education level: Not on file  Occupational History   Not on file  Tobacco Use   Smoking status: Former    Current packs/day: 0.00    Average packs/day: 1 pack/day for 50.0 years (50.0 ttl pk-yrs)    Types: Cigarettes    Start date: 12/14/1966    Quit date: 12/13/2016    Years since quitting: 7.4   Smokeless tobacco: Never  Vaping Use   Vaping status: Never Used  Substance and Sexual Activity   Alcohol use: No    Comment: quit alcohol 80's   Drug use: No   Sexual activity: Not on file  Other Topics Concern   Not on file  Social History Narrative   Not on file   Social Drivers of Health   Financial Resource Strain: Low Risk  (05/09/2022)   Received from [facility]   Overall Financial Resource Strain (CARDIA)    Difficulty of Paying Living Expenses: Not hard at all  Food Insecurity: No Food Insecurity (01/11/2024)   Hunger Vital Sign    Worried About Running Out of Food in the Last Year: Never true    Ran Out of Food in the Last Year: Never true  Transportation Needs: No Transportation Needs (01/11/2024)   PRAPARE - Administrator, Civil Service (Medical): No    Lack of Transportation (Non-Medical): No  Physical Activity: Not on file  Stress: No Stress Concern Present (05/26/2021)   Received from  [facility] - Occupational Stress Questionnaire    Feeling of Stress : Not at all  Social Connections: Unknown (01/11/2024)   Social Connection and Isolation Panel    Frequency of Communication with Friends and Family: More than three times a week    Frequency of Social Gatherings with Friends and Family: More than three times a week    Attends Religious Services: Never    Database administrator or Organizations: No    Attends Banker Meetings: Never    Marital Status: Patient declined     Review of Systems   Gen: Denies fever, chills, anorexia. Denies fatigue, weakness, weight loss.  CV: Denies chest pain, palpitations, syncope, peripheral edema, and claudication. Resp: Denies dyspnea at rest, cough, wheezing, coughing up blood, and pleurisy. GI: See HPI Derm: + dry skin, wounds to BLE. Denies rash.  Psych: Denies depression, anxiety, memory loss, confusion. No homicidal or suicidal ideation.  Heme: Denies bruising, bleeding, and enlarged lymph nodes. + limited movement.   Physical Exam   BP 129/79 (BP Location: Left Arm, Patient Position: Sitting, Cuff Size: Normal)   Pulse 69   Temp 97.6 F (36.4 C) (Oral)   Ht 5' 10 (1.778 m)   SpO2 93%   BMI 28.12 kg/m   General:   Alert and oriented. No distress noted. Pleasant and cooperative.  Head:  Normocephalic and atraumatic. Eyes:  Conjuctiva clear without scleral icterus. Lungs:  Clear to auscultation bilaterally. No wheezes, rales, or rhonchi. No distress.  Heart:  S1, S2 present without murmurs appreciated.  Abdomen:  +BS, soft, non-tender and non-distended. No rebound or guarding. No HSM or masses noted. Rectal: deferred Msk: Positive spasticity to lower extremities.  No obvious deformities.  Wheelchair/bedbound. Extremities: Bilateral leg wraps covering bilateral lower extremity wounds. Neurologic:  Alert and  oriented x4 Psych:  Alert and cooperative. Normal mood  and affect.  Assessment & Plan  Patrick Aguilar is a 73 y.o. male presenting today for follow-up of anemia.      Iron deficiency anemia Etiology most likely secondary to prior gastric ulcer which was oozing in May.  Has been taking his PPI twice daily as prescribed. Anemia is improving with oral iron therapy, but hemoglobin levels remain slightly below target. - Continue oral iron therapy daily.   Gastroesophageal reflux disease and history of gastric ulcer GERD and gastric ulcer have been managed with pantoprazole  twice daily for three months. No repeat upper endoscopy is needed per physician.  - Reduce pantoprazole  to once daily given completion of 3 months BID therapy.  - Monitor for recurrence of reflux or abdominal pain  Constipation managed with Miralax  Constipation is  well-managed with Miralax , taken almost daily. He reports regular bowel movements (soft/mush), no overt diarrhea, sometimes more than once a day, without significant constipation. - Continue Miralax  daily for bowel movements - Consider adding Metamucil 1 tablespoon daily if worsening symptoms.   Surveillance for history of colonic polyps with scheduling of colonoscopy Due for repeat colonoscopy due to history of colonic polyps. Previous colonoscopy in August 2020 was normal, but polyps were present in 2010. Scheduling has been challenging due to communication issues.  - Schedule colonoscopy for surveillance of colonic polyps - Discuss preparation logistics and assistance needs with family      Proceed with colonoscopy with propofol  by Dr. Eartha in near future: the risks, benefits, and alternatives have been discussed with the patient in detail. The patient states understanding and desires to proceed. ASA 3  - Will need to hold metformin  and glipizide  night prior to morning of procedure  - Hold iron for 1 week prior   Follow up   Follow up 4 months   Charmaine Melia, MSN, FNP-BC, AGACNP-BC [facility]  I have reviewed the note and agree with the APP's assessment as described in this progress note  Toribio Eartha, MD Gastroenterology and Hepatology [facility]

## 2024-05-21 NOTE — H&P (View-Only) (Signed)
 GI Office Note    Referring Provider: Maree Isles, MD Primary Care Physician:  Maree Isles, MD Primary Gastroenterologist: Toribio Rubins, MD  Date:  05/21/2024  ID:  Patrick Aguilar, DOB 03-Oct-1950, MRN [id number]   Chief Complaint   Chief Complaint  Patient presents with   Follow-up   History of Present Illness  Patrick Aguilar is a 73 y.o. male with a history of COPD, diabetes, HTN, paraplegia c/b multiple pressure injuries presenting today for follow up of anemia.   Colonoscopy September 2010: - 5 mm sessile rectal polyp -serrated adenoma -Scattered diverticulosis in the left colon -Tortuous and redundant colon found -Observed TI normal  -Normal mucosa on retroflexion -Advised colonoscopy in 10 years with high-fiber diet and Hemoccult x 3 yearly  Patient seen during hospitalization 5/2 for significant anemia.  Presented with hemoglobin of 7.1 with MCV 66, low iron saturation of 5% and ferritin of 14.  He had normal B12 and folate.  He was also having right-sided chest pain at the time and was found to have a right sided rib fracture.  He denied any melena or BRBPR at home.  Also denied any coffee-ground emesis, hematemesis, abdominal pain, shortness of breath until caregiver fell on him.  Has had some chronic fatigue and was noted to be on Celebrex and 81 mg aspirin .  He was transfused with a unit of blood and underwent upper endoscopy.  Was started on PPI IV inpatient.  He had previously reported an outpatient colonoscopy was performed at [facility] but was unsure of timing.   EGD 01/12/2024: - Normal esophagus - Oozing gastric ulcer which was injected and clipped s/p biopsy - Duodenitis - Diet was slowly advanced.  Recommended avoiding all NSAIDs. - Pathology negative for H. pylori but with chronic inactive gastritis.  Advised no need for repeat EGD.  Last office visit 02/13/24.  Taking iron daily with breakfast and also still taking Celebrex.  Tylenol  does  not work for his pain and also taking a baby aspirin .  Denies chest pain or shortness of breath.  Denies melena be regular.  Having some occasional constipation takes MiraLAX  daily.  Had taken double dose of MiraLAX  morning of appointment given no bowel movement in 2 days.  Had cut back on his MiraLAX  recently given he was having some stools that were more loose in nature.  Having some rib pain but otherwise feeling well.  Advised to check CBC and iron panel, consider colonoscopy pending timing of his last procedure.  Continue PPI twice daily.  Continue daily iron.  Continue MiraLAX  and add Metamucil daily.  Continue to advised cessation of Celebrex.  Colonoscopy August 2020: - Redundant sigmoid colon. Manual abdominal counterpressure was used to reach the cecum -Mild nonbleeding diverticulosis sigmoid colon -Normal retroflexion. -Advised repeat colonoscopy in 5 years  After reviewing his prior records in June I recommended that he have a follow-up colonoscopy given it had been 5 years.     Latest Ref Rng & Units 04/22/2024   10:48 AM 02/14/2024   11:27 AM 01/13/2024    4:51 AM  CBC  WBC 3.8 - 10.8 Thousand/uL 11.8  11.4  10.7   Hemoglobin 13.2 - 17.1 g/dL 88.1  89.5  7.8   Hematocrit 38.5 - 50.0 % 38.6  35.5  27.5   Platelets 140 - 400 Thousand/uL 324  280  340    Iron/TIBC/Ferritin/ %Sat    Component Value Date/Time   IRON 97 02/14/2024 1127   TIBC 265  02/14/2024 1127   FERRITIN 37 02/14/2024 1127   IRONPCTSAT 37 02/14/2024 1127   Patient ultimately was hard to get in touch with I was contacted multiple times to schedule colonoscopy.  Patient's daughter was contacted as well to get him scheduled and she also is having trouble getting in contact with him but stated she would try and get him to call the office.  Today:  Discussed the use of AI scribe software for clinical note transcription with the patient, who gave verbal consent to proceed.  He has a history of polyps found on  colonoscopy in September 2010, and his last colonoscopy in August 2020 was normal. There have been difficulties in scheduling a repeat colonoscopy due to communication issues.  He takes Miralax  almost daily for bowel movements but had to take a double dose recently after not having a bowel movement for two days. He has been taking pantoprazole  twice daily for GERD and gastric ulcers. His hemoglobin levels have improved since June. No recent melena, bright red blood per rectum, abdominal pain, chest pain, dizziness, or shortness of breath, nausea, vomiting, dysphagia.   He was switched from IV antibiotics to oral antibiotics because the PICC line or antibiotics were causing kidney issues. His feet were improving with the IV antibiotics. He experiences difficulty with the colonoscopy prep due to mobility issues and requires assistance from his caregiver, Cyndee, to manage bowel movements. He is concerned about the logistics of the prep process due to his limited mobility.  His bowel movements are softer but not loose, and he goes at least once a day, sometimes more. He continues to take iron supplements. No new chest pain, shortness of breath, trouble eating, nausea, or vomiting since the last visit. He experiences baseline shortness of breath due to inactivity but nothing out of the ordinary.      Wt Readings from Last 5 Encounters:  02/13/24 196 lb (88.9 kg)  01/11/24 196 lb 10.4 oz (89.2 kg)  09/07/22 182 lb 15.7 oz (83 kg)  10/12/21 183 lb (83 kg)  08/16/21 183 lb 3.2 oz (83.1 kg)    Current Outpatient Medications  Medication Sig Dispense Refill   acetaminophen  (TYLENOL ) 325 MG tablet Take 2 tablets (650 mg total) by mouth every 6 (six) hours as needed for mild pain (pain score 1-3) (or Fever >/= 101).     aspirin  EC 81 MG tablet Take 1 tablet (81 mg total) by mouth daily with breakfast. 30 tablet 11   atenolol  (TENORMIN ) 50 MG tablet Take 1 tablet (50 mg total) by mouth daily. 30 tablet 5    atenolol -chlorthalidone  (TENORETIC ) 50-25 MG tablet Take 1 tablet by mouth daily.     baclofen  (LIORESAL ) 10 MG tablet Take 10 mg by mouth 2 (two) times daily.     blood glucose meter kit and supplies KIT Dispense based on patient and insurance preference. Use up to four times daily as directed. (FOR ICD-9 250.00, 250.01). 1 each 0   doxazosin  (CARDURA  XL) 8 MG 24 hr tablet Take 8 mg by mouth daily with breakfast.     famotidine  (PEPCID ) 20 MG tablet Take 20 mg by mouth daily.     ferrous sulfate  325 (65 FE) MG tablet Take 1 tablet (325 mg total) by mouth daily with breakfast. 30 tablet 1   Fexofenadine HCl (ALLERGY 24-HR PO) Take by mouth.     furosemide  (LASIX ) 20 MG tablet Take 20 mg by mouth daily after breakfast.      gabapentin  (  NEURONTIN ) 800 MG tablet Take 1 tablet (800 mg total) by mouth See admin instructions. Take 1 tablet in the morning and 3 tablets every evening. 120 tablet 1   glipiZIDE  (GLUCOTROL ) 5 MG tablet Take 5 mg by mouth 2 (two) times daily before a meal.     linezolid  (ZYVOX ) 600 MG tablet Take 1 tablet (600 mg total) by mouth 2 (two) times daily. 42 tablet 0   metFORMIN  (GLUCOPHAGE ) 500 MG tablet Take 500 mg by mouth 2 (two) times daily with a meal. Take 1/2 taingblet in the morning and 1/2 every evening     oxyCODONE  (ROXICODONE ) 15 MG immediate release tablet Take 15 mg by mouth every 8 (eight) hours as needed.     pantoprazole  (PROTONIX ) 40 MG tablet Take 1 tablet (40 mg total) by mouth 2 (two) times daily before a meal. 180 tablet 3   potassium chloride  SA (KLOR-CON  M) 20 MEQ tablet Take 20 mEq by mouth 2 (two) times daily.     rosuvastatin (CRESTOR) 5 MG tablet Take 5 mg by mouth once a week. Every Sunday     traZODone  (DESYREL ) 100 MG tablet Take 100 mg by mouth See admin instructions. 2 tablets at bedtime     No current facility-administered medications for this visit.    Past Medical History:  Diagnosis Date   Acute osteomyelitis of left talus (HCC) 04/21/2024    AKI (acute kidney injury) (HCC) 12/2017   Angio-edema    Anxiety    Arthritis    BPH (benign prostatic hyperplasia)    Complication of anesthesia    low respirations, low BP   COPD (chronic obstructive pulmonary disease) (HCC)    DDD (degenerative disc disease), lumbar    Diabetes mellitus without complication (HCC)    type 2   Diastolic dysfunction 12/14/2017   Moderate noted on ECHO   Fournier's gangrene in male    GERD (gastroesophageal reflux disease)    History of ARDS    History of necrotizing fasciitis    Severe   Hypertension    Lumbar spondylosis    Mixed hyperlipidemia    Numbness and tingling of both upper extremities    Paraplegia [facility])    January 2023   PE (pulmonary thromboembolism) (HCC)    Pulmonary hypertension (HCC) 12/14/2017   Mild, noted on ECHO   Recurrent upper respiratory infection (URI)    Respiratory failure, acute (HCC) 12/2017   Septic shock (HCC) 12/2017   Shortness of breath dyspnea    Testicular pain, left    Wears glasses     Past Surgical History:  Procedure Laterality Date   ADENOIDECTOMY     APPENDECTOMY     APPLICATION OF A-CELL OF CHEST/ABDOMEN N/A 01/08/2018   Procedure: APPLICATION OF A-CELL OF GROIN;  Surgeon: Lowery Estefana RAMAN, DO;  Location: [facility] OR;  Service: Plastics;  Laterality: N/A;   APPLICATION OF A-CELL OF EXTREMITY N/A 12/25/2017   Procedure: APPLICATION OF A-CELL;  Surgeon: Lowery Estefana RAMAN, DO;  Location: [facility];  Service: Plastics;  Laterality: N/A;   BACK SURGERY     x5   CARDIAC CATHETERIZATION  2006   neg   CERVICAL DISC SURGERY     x2   COLONOSCOPY     DEBRIDEMENT AND CLOSURE WOUND N/A 01/26/2018   Procedure: REVISION OF PERINEUM WOUND WITH DEBRIDEMENT, PARTIAL CLOSURE OF PERINEUM, PLACEMENT OF CELLERATE COLLAGEN;  Surgeon: Lowery Estefana RAMAN, DO;  Location: [facility];  Service: Plastics;  Laterality: N/A;   DENTAL  SURGERY     teeth extractions   ESOPHAGOGASTRODUODENOSCOPY N/A 01/12/2024   Procedure:  EGD (ESOPHAGOGASTRODUODENOSCOPY);  Surgeon: Eartha Flavors, Toribio, MD;  Location: [facility];  Service: Gastroenterology;  Laterality: N/A;   groin wound  01/08/2018   : EXCISION OF GROIN WOUND WITH PLACEMENT OF ACELL, AND PRIMARY WOUND CLOSURE (N/A Scrotum)   I & D EXTREMITY N/A 03/12/2018   Procedure: IRRIGATION AND DEBRIDEMENT PERIMUM WOUND WITH CLOSURE;  Surgeon: Lowery Estefana RAMAN, DO;  Location: [facility] OR;  Service: Plastics;  Laterality: N/A;   INCISION AND DRAINAGE ABSCESS Left 07/11/2018   Procedure: INCISION AND DRAINAGE ABSCESS LEFT THIGH;  Surgeon: Matilda Senior, MD;  Location: [facility];  Service: Urology;  Laterality: Left;   INCISION AND DRAINAGE OF WOUND N/A 12/25/2017   Procedure: Irrigation and debridement of Fournier's of scrotum with placement of testes in subcutaneous thigh pockets and Acell placement;  Surgeon: Lowery Estefana RAMAN, DO;  Location: [facility];  Service: Plastics;  Laterality: N/A;   INCISION AND DRAINAGE OF WOUND N/A 01/08/2018   Procedure: EXCISION OF GROIN WOUND WITH PLACEMENT OF ACELL, AND PRIMARY WOUND CLOSURE;  Surgeon: Lowery Estefana RAMAN, DO;  Location: [facility] OR;  Service: Plastics;  Laterality: N/A;   ORCHIECTOMY N/A 12/13/2017   Procedure: EXCISION OF SCROTUM AND DEBRIDEMENT OF PENIS;  Surgeon: Matilda Senior, MD;  Location: [facility];  Service: Urology;  Laterality: N/A;   ORCHIECTOMY Left 06/22/2018   Procedure: ORCHIECTOMY;  Surgeon: Matilda Senior, MD;  Location: [facility];  Service: Urology;  Laterality: Left;   PLANTAR FASCIA SURGERY Bilateral    shoulders Bilateral    rotator cuff   SUBMANDIBULAR GLAND EXCISION Left 03/12/2015   Procedure: LEFT SUBMANDIBULAR GLAND RESECTION;  Surgeon: Vaughan Ricker, MD;  Location: [facility] OR;  Service: ENT;  Laterality: Left;   TONSILLECTOMY     TOTAL HIP ARTHROPLASTY Right 03/06/2019   Procedure: TOTAL HIP ARTHROPLASTY ANTERIOR APPROACH;  Surgeon: Melodi Lerner, MD;  Location: [facility];   Service: Orthopedics;  Laterality: Right;     Family History  Problem Relation Age of Onset   Lung cancer Mother    Bladder Cancer Father    Allergic rhinitis Neg Hx    Asthma Neg Hx    Eczema Neg Hx    Urticaria Neg Hx     Allergies as of 05/21/2024 - Review Complete 05/21/2024  Allergen Reaction Noted   Celecoxib Other (See Comments) 02/08/2022   Tramadol  Other (See Comments) 11/11/2016   Adenosine  11/11/2016   Bupropion  11/11/2016   Morphine  and codeine Itching 03/04/2015    Social History   Socioeconomic History   Marital status: Single    Spouse name: Not on file   Number of children: Not on file   Years of education: Not on file   Highest education level: Not on file  Occupational History   Not on file  Tobacco Use   Smoking status: Former    Current packs/day: 0.00    Average packs/day: 1 pack/day for 50.0 years (50.0 ttl pk-yrs)    Types: Cigarettes    Start date: 12/14/1966    Quit date: 12/13/2016    Years since quitting: 7.4   Smokeless tobacco: Never  Vaping Use   Vaping status: Never Used  Substance and Sexual Activity   Alcohol use: No    Comment: quit alcohol 80's   Drug use: No   Sexual activity: Not on file  Other Topics Concern   Not on file  Social History Narrative   Not on file   Social Drivers of Health   Financial Resource Strain: Low Risk  (05/09/2022)   Received from [facility]   Overall Financial Resource Strain (CARDIA)    Difficulty of Paying Living Expenses: Not hard at all  Food Insecurity: No Food Insecurity (01/11/2024)   Hunger Vital Sign    Worried About Running Out of Food in the Last Year: Never true    Ran Out of Food in the Last Year: Never true  Transportation Needs: No Transportation Needs (01/11/2024)   PRAPARE - Administrator, Civil Service (Medical): No    Lack of Transportation (Non-Medical): No  Physical Activity: Not on file  Stress: No Stress Concern Present (05/26/2021)   Received from  [facility] - Occupational Stress Questionnaire    Feeling of Stress : Not at all  Social Connections: Unknown (01/11/2024)   Social Connection and Isolation Panel    Frequency of Communication with Friends and Family: More than three times a week    Frequency of Social Gatherings with Friends and Family: More than three times a week    Attends Religious Services: Never    Database administrator or Organizations: No    Attends Banker Meetings: Never    Marital Status: Patient declined     Review of Systems   Gen: Denies fever, chills, anorexia. Denies fatigue, weakness, weight loss.  CV: Denies chest pain, palpitations, syncope, peripheral edema, and claudication. Resp: Denies dyspnea at rest, cough, wheezing, coughing up blood, and pleurisy. GI: See HPI Derm: + dry skin, wounds to BLE. Denies rash.  Psych: Denies depression, anxiety, memory loss, confusion. No homicidal or suicidal ideation.  Heme: Denies bruising, bleeding, and enlarged lymph nodes. + limited movement.   Physical Exam   BP 129/79 (BP Location: Left Arm, Patient Position: Sitting, Cuff Size: Normal)   Pulse 69   Temp 97.6 F (36.4 C) (Oral)   Ht 5' 10 (1.778 m)   SpO2 93%   BMI 28.12 kg/m   General:   Alert and oriented. No distress noted. Pleasant and cooperative.  Head:  Normocephalic and atraumatic. Eyes:  Conjuctiva clear without scleral icterus. Lungs:  Clear to auscultation bilaterally. No wheezes, rales, or rhonchi. No distress.  Heart:  S1, S2 present without murmurs appreciated.  Abdomen:  +BS, soft, non-tender and non-distended. No rebound or guarding. No HSM or masses noted. Rectal: deferred Msk: Positive spasticity to lower extremities.  No obvious deformities.  Wheelchair/bedbound. Extremities: Bilateral leg wraps covering bilateral lower extremity wounds. Neurologic:  Alert and  oriented x4 Psych:  Alert and cooperative. Normal mood  and affect.  Assessment & Plan  Patrick Aguilar is a 73 y.o. male presenting today for follow-up of anemia.      Iron deficiency anemia Etiology most likely secondary to prior gastric ulcer which was oozing in May.  Has been taking his PPI twice daily as prescribed. Anemia is improving with oral iron therapy, but hemoglobin levels remain slightly below target. - Continue oral iron therapy daily.   Gastroesophageal reflux disease and history of gastric ulcer GERD and gastric ulcer have been managed with pantoprazole  twice daily for three months. No repeat upper endoscopy is needed per physician.  - Reduce pantoprazole  to once daily given completion of 3 months BID therapy.  - Monitor for recurrence of reflux or abdominal pain  Constipation managed with Miralax  Constipation is  well-managed with Miralax , taken almost daily. He reports regular bowel movements (soft/mush), no overt diarrhea, sometimes more than once a day, without significant constipation. - Continue Miralax  daily for bowel movements - Consider adding Metamucil 1 tablespoon daily if worsening symptoms.   Surveillance for history of colonic polyps with scheduling of colonoscopy Due for repeat colonoscopy due to history of colonic polyps. Previous colonoscopy in August 2020 was normal, but polyps were present in 2010. Scheduling has been challenging due to communication issues.  - Schedule colonoscopy for surveillance of colonic polyps - Discuss preparation logistics and assistance needs with family      Proceed with colonoscopy with propofol  by Dr. Eartha in near future: the risks, benefits, and alternatives have been discussed with the patient in detail. The patient states understanding and desires to proceed. ASA 3  - Will need to hold metformin  and glipizide  night prior to morning of procedure  - Hold iron for 1 week prior   Follow up   Follow up 4 months   Charmaine Melia, MSN, FNP-BC, AGACNP-BC [facility]  I have reviewed the note and agree with the APP's assessment as described in this progress note  Toribio Eartha, MD Gastroenterology and Hepatology [facility]

## 2024-05-21 NOTE — Patient Instructions (Addendum)
 We will get you scheduled for colonoscopy in the near future with Dr. Eartha.  You will receive a separate detailed written instructions regarding medication adjustments needed for your procedure.  Continue taking iron daily.  You may reduce your pantoprazole  to 40 mg once daily, 30 minutes prior to breakfast or 30 minutes prior to dinner, whichever you prefer.  Continue taking MiraLAX  for constipation.  If you begin having more difficulty and want you to add Metamucil 1 tablespoon daily.  It was a pleasure to see you today. I want to create trusting relationships with patients. If you receive a survey regarding your visit,  I greatly appreciate you taking time to fill this out on paper or through your MyChart. I value your feedback.  Charmaine Melia, MSN, FNP-BC, AGACNP-BC [facility]

## 2024-05-22 ENCOUNTER — Encounter: Payer: Self-pay | Admitting: *Deleted

## 2024-05-22 DIAGNOSIS — S81801S Unspecified open wound, right lower leg, sequela: Secondary | ICD-10-CM | POA: Diagnosis not present

## 2024-05-22 DIAGNOSIS — S81801A Unspecified open wound, right lower leg, initial encounter: Secondary | ICD-10-CM | POA: Diagnosis not present

## 2024-05-22 DIAGNOSIS — M86072 Acute hematogenous osteomyelitis, left ankle and foot: Secondary | ICD-10-CM | POA: Diagnosis not present

## 2024-05-22 DIAGNOSIS — S81801D Unspecified open wound, right lower leg, subsequent encounter: Secondary | ICD-10-CM | POA: Diagnosis not present

## 2024-05-22 DIAGNOSIS — M869 Osteomyelitis, unspecified: Secondary | ICD-10-CM | POA: Diagnosis not present

## 2024-05-22 DIAGNOSIS — S81802S Unspecified open wound, left lower leg, sequela: Secondary | ICD-10-CM | POA: Diagnosis not present

## 2024-05-24 DIAGNOSIS — G8220 Paraplegia, unspecified: Secondary | ICD-10-CM | POA: Diagnosis not present

## 2024-05-24 DIAGNOSIS — J449 Chronic obstructive pulmonary disease, unspecified: Secondary | ICD-10-CM | POA: Diagnosis not present

## 2024-05-24 DIAGNOSIS — E1142 Type 2 diabetes mellitus with diabetic polyneuropathy: Secondary | ICD-10-CM | POA: Diagnosis not present

## 2024-05-24 DIAGNOSIS — L89512 Pressure ulcer of right ankle, stage 2: Secondary | ICD-10-CM | POA: Diagnosis not present

## 2024-05-24 DIAGNOSIS — L89892 Pressure ulcer of other site, stage 2: Secondary | ICD-10-CM | POA: Diagnosis not present

## 2024-05-24 DIAGNOSIS — I119 Hypertensive heart disease without heart failure: Secondary | ICD-10-CM | POA: Diagnosis not present

## 2024-05-27 DIAGNOSIS — L89892 Pressure ulcer of other site, stage 2: Secondary | ICD-10-CM | POA: Diagnosis not present

## 2024-05-27 DIAGNOSIS — G8220 Paraplegia, unspecified: Secondary | ICD-10-CM | POA: Diagnosis not present

## 2024-05-27 DIAGNOSIS — I119 Hypertensive heart disease without heart failure: Secondary | ICD-10-CM | POA: Diagnosis not present

## 2024-05-27 DIAGNOSIS — J449 Chronic obstructive pulmonary disease, unspecified: Secondary | ICD-10-CM | POA: Diagnosis not present

## 2024-05-27 DIAGNOSIS — E1142 Type 2 diabetes mellitus with diabetic polyneuropathy: Secondary | ICD-10-CM | POA: Diagnosis not present

## 2024-05-27 DIAGNOSIS — L89512 Pressure ulcer of right ankle, stage 2: Secondary | ICD-10-CM | POA: Diagnosis not present

## 2024-05-28 ENCOUNTER — Other Ambulatory Visit: Payer: Self-pay | Admitting: *Deleted

## 2024-05-28 DIAGNOSIS — D649 Anemia, unspecified: Secondary | ICD-10-CM

## 2024-05-29 ENCOUNTER — Encounter: Payer: Self-pay | Admitting: Infectious Disease

## 2024-05-29 ENCOUNTER — Encounter (HOSPITAL_BASED_OUTPATIENT_CLINIC_OR_DEPARTMENT_OTHER): Attending: General Surgery | Admitting: General Surgery

## 2024-05-29 DIAGNOSIS — S81801A Unspecified open wound, right lower leg, initial encounter: Secondary | ICD-10-CM | POA: Diagnosis not present

## 2024-05-29 DIAGNOSIS — M86072 Acute hematogenous osteomyelitis, left ankle and foot: Secondary | ICD-10-CM | POA: Diagnosis not present

## 2024-05-29 DIAGNOSIS — E1169 Type 2 diabetes mellitus with other specified complication: Secondary | ICD-10-CM | POA: Diagnosis not present

## 2024-05-29 DIAGNOSIS — Z7984 Long term (current) use of oral hypoglycemic drugs: Secondary | ICD-10-CM | POA: Diagnosis not present

## 2024-05-29 DIAGNOSIS — M86672 Other chronic osteomyelitis, left ankle and foot: Secondary | ICD-10-CM | POA: Diagnosis not present

## 2024-05-29 DIAGNOSIS — S81802S Unspecified open wound, left lower leg, sequela: Secondary | ICD-10-CM | POA: Diagnosis not present

## 2024-05-29 DIAGNOSIS — S81801D Unspecified open wound, right lower leg, subsequent encounter: Secondary | ICD-10-CM | POA: Diagnosis not present

## 2024-05-29 DIAGNOSIS — G8220 Paraplegia, unspecified: Secondary | ICD-10-CM | POA: Insufficient documentation

## 2024-05-29 DIAGNOSIS — E11622 Type 2 diabetes mellitus with other skin ulcer: Secondary | ICD-10-CM | POA: Diagnosis not present

## 2024-05-29 DIAGNOSIS — L97812 Non-pressure chronic ulcer of other part of right lower leg with fat layer exposed: Secondary | ICD-10-CM | POA: Diagnosis not present

## 2024-05-29 DIAGNOSIS — L97822 Non-pressure chronic ulcer of other part of left lower leg with fat layer exposed: Secondary | ICD-10-CM | POA: Diagnosis not present

## 2024-05-29 DIAGNOSIS — M869 Osteomyelitis, unspecified: Secondary | ICD-10-CM | POA: Diagnosis not present

## 2024-05-29 DIAGNOSIS — S81801S Unspecified open wound, right lower leg, sequela: Secondary | ICD-10-CM | POA: Diagnosis not present

## 2024-05-31 DIAGNOSIS — G8220 Paraplegia, unspecified: Secondary | ICD-10-CM | POA: Diagnosis not present

## 2024-05-31 DIAGNOSIS — I119 Hypertensive heart disease without heart failure: Secondary | ICD-10-CM | POA: Diagnosis not present

## 2024-05-31 DIAGNOSIS — L89512 Pressure ulcer of right ankle, stage 2: Secondary | ICD-10-CM | POA: Diagnosis not present

## 2024-05-31 DIAGNOSIS — L89892 Pressure ulcer of other site, stage 2: Secondary | ICD-10-CM | POA: Diagnosis not present

## 2024-05-31 DIAGNOSIS — E1142 Type 2 diabetes mellitus with diabetic polyneuropathy: Secondary | ICD-10-CM | POA: Diagnosis not present

## 2024-05-31 DIAGNOSIS — J449 Chronic obstructive pulmonary disease, unspecified: Secondary | ICD-10-CM | POA: Diagnosis not present

## 2024-06-03 ENCOUNTER — Encounter: Payer: Self-pay | Admitting: Infectious Disease

## 2024-06-03 DIAGNOSIS — J449 Chronic obstructive pulmonary disease, unspecified: Secondary | ICD-10-CM | POA: Diagnosis not present

## 2024-06-03 DIAGNOSIS — L89892 Pressure ulcer of other site, stage 2: Secondary | ICD-10-CM | POA: Diagnosis not present

## 2024-06-03 DIAGNOSIS — E1142 Type 2 diabetes mellitus with diabetic polyneuropathy: Secondary | ICD-10-CM | POA: Diagnosis not present

## 2024-06-03 DIAGNOSIS — G8220 Paraplegia, unspecified: Secondary | ICD-10-CM | POA: Diagnosis not present

## 2024-06-03 DIAGNOSIS — L89512 Pressure ulcer of right ankle, stage 2: Secondary | ICD-10-CM | POA: Diagnosis not present

## 2024-06-03 DIAGNOSIS — I119 Hypertensive heart disease without heart failure: Secondary | ICD-10-CM | POA: Diagnosis not present

## 2024-06-04 ENCOUNTER — Ambulatory Visit: Payer: Self-pay | Admitting: Infectious Disease

## 2024-06-06 ENCOUNTER — Encounter (HOSPITAL_BASED_OUTPATIENT_CLINIC_OR_DEPARTMENT_OTHER): Admitting: General Surgery

## 2024-06-06 DIAGNOSIS — L97822 Non-pressure chronic ulcer of other part of left lower leg with fat layer exposed: Secondary | ICD-10-CM | POA: Diagnosis not present

## 2024-06-06 DIAGNOSIS — H40033 Anatomical narrow angle, bilateral: Secondary | ICD-10-CM | POA: Diagnosis not present

## 2024-06-06 DIAGNOSIS — M86672 Other chronic osteomyelitis, left ankle and foot: Secondary | ICD-10-CM | POA: Diagnosis not present

## 2024-06-06 DIAGNOSIS — L89510 Pressure ulcer of right ankle, unstageable: Secondary | ICD-10-CM | POA: Diagnosis not present

## 2024-06-06 DIAGNOSIS — E1169 Type 2 diabetes mellitus with other specified complication: Secondary | ICD-10-CM | POA: Diagnosis not present

## 2024-06-06 DIAGNOSIS — H04123 Dry eye syndrome of bilateral lacrimal glands: Secondary | ICD-10-CM | POA: Diagnosis not present

## 2024-06-06 DIAGNOSIS — E11622 Type 2 diabetes mellitus with other skin ulcer: Secondary | ICD-10-CM | POA: Diagnosis not present

## 2024-06-06 DIAGNOSIS — L97812 Non-pressure chronic ulcer of other part of right lower leg with fat layer exposed: Secondary | ICD-10-CM | POA: Diagnosis not present

## 2024-06-06 DIAGNOSIS — G8220 Paraplegia, unspecified: Secondary | ICD-10-CM | POA: Diagnosis not present

## 2024-06-06 NOTE — Patient Instructions (Signed)
 Patrick Aguilar [facility]  06/06/2024     @PREFPERIOPPHARMACY @   Your procedure is scheduled on 06/11/2024.   Report to [facility] at  [phone] A.M.   Call this number if you have problems the morning of surgery:  [phone]  If you experience any cold or flu symptoms such as cough, fever, chills, shortness of breath, etc. between now and your scheduled surgery, please notify us  at the above number.   Remember:        DO NOT take any medications for diabetes the morning of your procedure.   Follow the diet and prep instructions given to you by the office.   You may drink clear liquids until 0415 am on 06/11/2024.      Clear liquids allowed are:                    Water , Juice (No red color; non-citric and without pulp; diabetics please choose diet or no sugar options), Carbonated beverages (diabetics please choose diet or no sugar options), Clear Tea (No creamer, milk, or cream, including half & half and powdered creamer), Black Coffee Only (No creamer, milk or cream, including half & half and powdered creamer), and Clear Sports drink (No red color; diabetics please choose diet or no sugar options)    Take these medicines the morning of surgery with A SIP OF WATER         tenormin , baclofen , cardura , famotidine , gabapentin , linezolid , oxycodone (if needed), pantoprazole .    Do not wear jewelry, make-up or nail polish, including gel polish,  artificial nails, or any other type of covering on natural nails (fingers and  toes).  Do not wear lotions, powders, or perfumes, or deodorant.  Do not shave 48 hours prior to surgery.  Men may shave face and neck.  Do not bring valuables to the hospital.  [facility] is not responsible for any belongings or valuables.  Contacts, dentures or bridgework may not be worn into surgery.  Leave your suitcase in the car.  After surgery it may be brought to your room.  For patients admitted to the hospital, discharge time will be determined by your  treatment team.  Patients discharged the day of surgery will not be allowed to drive home and must have someone with them for 24 hours.    Special instructions:   DO NOT smoke tobacco or vape for 24 hours before your procedure.  Please read over the following fact sheets that you were given. Anesthesia Post-op Instructions and Care and Recovery After Surgery      Colonoscopy, Adult, Care After The following information offers guidance on how to care for yourself after your procedure. Your health care provider may also give you more specific instructions. If you have problems or questions, contact your health care provider. What can I expect after the procedure? After the procedure, it is common to have: A small amount of blood in your stool for 24 hours after the procedure. Some gas. Mild cramping or bloating of your abdomen. Follow these instructions at home: Eating and drinking  Drink enough fluid to keep your urine pale yellow. Follow instructions from your health care provider about eating or drinking restrictions. Resume your normal diet as told by your health care provider. Avoid heavy or fried foods that are hard to digest. Activity Rest as told by your health care provider. Avoid sitting for a long time without moving. Get up to take short walks every 1-2  hours. This is important to improve blood flow and breathing. Ask for help if you feel weak or unsteady. Return to your normal activities as told by your health care provider. Ask your health care provider what activities are safe for you. Managing cramping and bloating  Try walking around when you have cramps or feel bloated. If directed, apply heat to your abdomen as told by your health care provider. Use the heat source that your health care provider recommends, such as a moist heat pack or a heating pad. Place a towel between your skin and the heat source. Leave the heat on for 20-30 minutes. Remove the heat if your  skin turns bright red. This is especially important if you are unable to feel pain, heat, or cold. You have a greater risk of getting burned. General instructions If you were given a sedative during the procedure, it can affect you for several hours. Do not drive or operate machinery until your health care provider says that it is safe. For the first 24 hours after the procedure: Do not sign important documents. Do not drink alcohol. Do your regular daily activities at a slower pace than normal. Eat soft foods that are easy to digest. Take over-the-counter and prescription medicines only as told by your health care provider. Keep all follow-up visits. This is important. Contact a health care provider if: You have blood in your stool 2-3 days after the procedure. Get help right away if: You have more than a small spotting of blood in your stool. You have large blood clots in your stool. You have swelling of your abdomen. You have nausea or vomiting. You have a fever. You have increasing pain in your abdomen that is not relieved with medicine. These symptoms may be an emergency. Get help right away. Call 911. Do not wait to see if the symptoms will go away. Do not drive yourself to the hospital. Summary After the procedure, it is common to have a small amount of blood in your stool. You may also have mild cramping and bloating of your abdomen. If you were given a sedative during the procedure, it can affect you for several hours. Do not drive or operate machinery until your health care provider says that it is safe. Get help right away if you have a lot of blood in your stool, nausea or vomiting, a fever, or increased pain in your abdomen. This information is not intended to replace advice given to you by your health care provider. Make sure you discuss any questions you have with your health care provider. Document Revised: 10/11/2022 Document Reviewed: 04/21/2021 Elsevier Patient  Education  2024 [employer]General Anesthesia, Adult, Care After The following information offers guidance on how to care for yourself after your procedure. Your health care provider may also give you more specific instructions. If you have problems or questions, contact your health care provider. What can I expect after the procedure? After the procedure, it is common for people to: Have pain or discomfort at the IV site. Have nausea or vomiting. Have a sore throat or hoarseness. Have trouble concentrating. Feel cold or chills. Feel weak, sleepy, or tired (fatigue). Have soreness and body aches. These can affect parts of the body that were not involved in surgery. Follow these instructions at home: For the time period you were told by your health care provider:  Rest. Do not participate in activities where you could fall or become injured. Do not drive or use machinery.  Do not drink alcohol. Do not take sleeping pills or medicines that cause drowsiness. Do not make important decisions or sign legal documents. Do not take care of children on your own. General instructions Drink enough fluid to keep your urine pale yellow. If you have sleep apnea, surgery and certain medicines can increase your risk for breathing problems. Follow instructions from your health care provider about wearing your sleep device: Anytime you are sleeping, including during daytime naps. While taking prescription pain medicines, sleeping medicines, or medicines that make you drowsy. Return to your normal activities as told by your health care provider. Ask your health care provider what activities are safe for you. Take over-the-counter and prescription medicines only as told by your health care provider. Do not use any products that contain nicotine or tobacco. These products include cigarettes, chewing tobacco, and vaping devices, such as e-cigarettes. These can delay incision healing after surgery. If you need  help quitting, ask your health care provider. Contact a health care provider if: You have nausea or vomiting that does not get better with medicine. You vomit every time you eat or drink. You have pain that does not get better with medicine. You cannot urinate or have bloody urine. You develop a skin rash. You have a fever. Get help right away if: You have trouble breathing. You have chest pain. You vomit blood. These symptoms may be an emergency. Get help right away. Call 911. Do not wait to see if the symptoms will go away. Do not drive yourself to the hospital. Summary After the procedure, it is common to have a sore throat, hoarseness, nausea, vomiting, or to feel weak, sleepy, or fatigue. For the time period you were told by your health care provider, do not drive or use machinery. Get help right away if you have difficulty breathing, have chest pain, or vomit blood. These symptoms may be an emergency. This information is not intended to replace advice given to you by your health care provider. Make sure you discuss any questions you have with your health care provider. Document Revised: 11/26/2021 Document Reviewed: 11/26/2021 Elsevier Patient Education  2024 [employer].

## 2024-06-07 ENCOUNTER — Encounter (HOSPITAL_COMMUNITY)
Admission: RE | Admit: 2024-06-07 | Discharge: 2024-06-07 | Disposition: A | Discharge status: Home / Self Care | Source: Ambulatory Visit | Attending: Gastroenterology | Admitting: Gastroenterology

## 2024-06-07 VITALS — BP 129/79 | HR 69 | Resp 18 | Ht 70.0 in | Wt 196.0 lb

## 2024-06-07 DIAGNOSIS — I10 Essential (primary) hypertension: Secondary | ICD-10-CM | POA: Diagnosis not present

## 2024-06-07 DIAGNOSIS — E119 Type 2 diabetes mellitus without complications: Secondary | ICD-10-CM | POA: Insufficient documentation

## 2024-06-07 DIAGNOSIS — Z01810 Encounter for preprocedural cardiovascular examination: Secondary | ICD-10-CM | POA: Diagnosis not present

## 2024-06-10 DIAGNOSIS — I119 Hypertensive heart disease without heart failure: Secondary | ICD-10-CM | POA: Diagnosis not present

## 2024-06-10 DIAGNOSIS — E1142 Type 2 diabetes mellitus with diabetic polyneuropathy: Secondary | ICD-10-CM | POA: Diagnosis not present

## 2024-06-10 DIAGNOSIS — L89512 Pressure ulcer of right ankle, stage 2: Secondary | ICD-10-CM | POA: Diagnosis not present

## 2024-06-10 DIAGNOSIS — L89892 Pressure ulcer of other site, stage 2: Secondary | ICD-10-CM | POA: Diagnosis not present

## 2024-06-10 DIAGNOSIS — G8220 Paraplegia, unspecified: Secondary | ICD-10-CM | POA: Diagnosis not present

## 2024-06-10 DIAGNOSIS — J449 Chronic obstructive pulmonary disease, unspecified: Secondary | ICD-10-CM | POA: Diagnosis not present

## 2024-06-11 ENCOUNTER — Ambulatory Visit (HOSPITAL_COMMUNITY): Admitting: Anesthesiology

## 2024-06-11 ENCOUNTER — Ambulatory Visit (HOSPITAL_COMMUNITY)
Admission: RE | Admit: 2024-06-11 | Discharge: 2024-06-11 | Disposition: A | Discharge status: Home / Self Care | Attending: Gastroenterology | Admitting: Gastroenterology

## 2024-06-11 ENCOUNTER — Encounter (INDEPENDENT_AMBULATORY_CARE_PROVIDER_SITE_OTHER): Payer: Self-pay | Admitting: *Deleted

## 2024-06-11 ENCOUNTER — Encounter (HOSPITAL_COMMUNITY)
Admission: RE | Disposition: A | Discharge status: Home / Self Care | Payer: Self-pay | Source: Home / Self Care | Attending: Gastroenterology

## 2024-06-11 ENCOUNTER — Encounter (HOSPITAL_COMMUNITY): Payer: Self-pay | Admitting: Gastroenterology

## 2024-06-11 DIAGNOSIS — Z7984 Long term (current) use of oral hypoglycemic drugs: Secondary | ICD-10-CM | POA: Diagnosis not present

## 2024-06-11 DIAGNOSIS — G8220 Paraplegia, unspecified: Secondary | ICD-10-CM | POA: Insufficient documentation

## 2024-06-11 DIAGNOSIS — Z1211 Encounter for screening for malignant neoplasm of colon: Secondary | ICD-10-CM | POA: Diagnosis not present

## 2024-06-11 DIAGNOSIS — D120 Benign neoplasm of cecum: Secondary | ICD-10-CM | POA: Diagnosis not present

## 2024-06-11 DIAGNOSIS — M1990 Unspecified osteoarthritis, unspecified site: Secondary | ICD-10-CM | POA: Insufficient documentation

## 2024-06-11 DIAGNOSIS — K648 Other hemorrhoids: Secondary | ICD-10-CM | POA: Insufficient documentation

## 2024-06-11 DIAGNOSIS — Z87891 Personal history of nicotine dependence: Secondary | ICD-10-CM | POA: Diagnosis not present

## 2024-06-11 DIAGNOSIS — D123 Benign neoplasm of transverse colon: Secondary | ICD-10-CM | POA: Insufficient documentation

## 2024-06-11 DIAGNOSIS — K219 Gastro-esophageal reflux disease without esophagitis: Secondary | ICD-10-CM | POA: Diagnosis not present

## 2024-06-11 DIAGNOSIS — I10 Essential (primary) hypertension: Secondary | ICD-10-CM | POA: Insufficient documentation

## 2024-06-11 DIAGNOSIS — E119 Type 2 diabetes mellitus without complications: Secondary | ICD-10-CM | POA: Insufficient documentation

## 2024-06-11 DIAGNOSIS — I129 Hypertensive chronic kidney disease with stage 1 through stage 4 chronic kidney disease, or unspecified chronic kidney disease: Secondary | ICD-10-CM | POA: Diagnosis not present

## 2024-06-11 DIAGNOSIS — J449 Chronic obstructive pulmonary disease, unspecified: Secondary | ICD-10-CM | POA: Insufficient documentation

## 2024-06-11 DIAGNOSIS — K5520 Angiodysplasia of colon without hemorrhage: Secondary | ICD-10-CM | POA: Diagnosis not present

## 2024-06-11 DIAGNOSIS — Z860100 Personal history of colon polyps, unspecified: Secondary | ICD-10-CM | POA: Diagnosis not present

## 2024-06-11 DIAGNOSIS — Z79899 Other long term (current) drug therapy: Secondary | ICD-10-CM | POA: Insufficient documentation

## 2024-06-11 DIAGNOSIS — K6289 Other specified diseases of anus and rectum: Secondary | ICD-10-CM

## 2024-06-11 DIAGNOSIS — D509 Iron deficiency anemia, unspecified: Secondary | ICD-10-CM | POA: Insufficient documentation

## 2024-06-11 DIAGNOSIS — N182 Chronic kidney disease, stage 2 (mild): Secondary | ICD-10-CM

## 2024-06-11 DIAGNOSIS — K5900 Constipation, unspecified: Secondary | ICD-10-CM | POA: Diagnosis not present

## 2024-06-11 DIAGNOSIS — K635 Polyp of colon: Secondary | ICD-10-CM | POA: Insufficient documentation

## 2024-06-11 DIAGNOSIS — K5730 Diverticulosis of large intestine without perforation or abscess without bleeding: Secondary | ICD-10-CM | POA: Diagnosis not present

## 2024-06-11 DIAGNOSIS — D649 Anemia, unspecified: Secondary | ICD-10-CM | POA: Diagnosis present

## 2024-06-11 HISTORY — PX: COLONOSCOPY: SHX5424

## 2024-06-11 LAB — GLUCOSE, CAPILLARY: Glucose-Capillary: 111 mg/dL — ABNORMAL HIGH (ref 70–99)

## 2024-06-11 LAB — HM COLONOSCOPY

## 2024-06-11 SURGERY — COLONOSCOPY
Anesthesia: General

## 2024-06-11 MED ORDER — CLOTRIMAZOLE 1 % EX CREA: 1.0000 | TOPICAL_CREAM | Freq: Two times a day (BID) | CUTANEOUS | 0 refills | Status: AC

## 2024-06-11 MED ORDER — PHENYLEPHRINE HCL (PRESSORS) 10 MG/ML IV SOLN
INTRAVENOUS | Status: DC | PRN
  Administered 2024-06-11 (×2): 100 ug via INTRAVENOUS
  Administered 2024-06-11: 200 ug via INTRAVENOUS

## 2024-06-11 MED ORDER — PROPOFOL 10 MG/ML IV BOLUS
INTRAVENOUS | Status: DC | PRN
  Administered 2024-06-11 (×3): 20 mg via INTRAVENOUS
  Administered 2024-06-11: 80 mg via INTRAVENOUS
  Administered 2024-06-11 (×2): 20 mg via INTRAVENOUS

## 2024-06-11 MED ORDER — LACTATED RINGERS IV SOLN: INTRAVENOUS | Status: DC

## 2024-06-11 MED ORDER — LIDOCAINE HCL (CARDIAC) PF 100 MG/5ML IV SOSY
PREFILLED_SYRINGE | INTRAVENOUS | Status: DC | PRN
  Administered 2024-06-11: 50 mg via INTRAVENOUS

## 2024-06-11 NOTE — Anesthesia Preprocedure Evaluation (Addendum)
 Anesthesia Evaluation  Patient identified by MRN, date of birth, ID band Patient awake    Reviewed: Allergy & Precautions, H&P , NPO status , Patient's Chart, lab work & pertinent test results, reviewed documented beta blocker date and time   Airway Mallampati: II  TM Distance: >3 FB Neck ROM: full    Dental  (+) Dental Advisory Given, Missing Some lower back molars missing:   Pulmonary shortness of breath, COPD, former smoker, PE History of respiratory failure   Pulmonary exam normal breath sounds clear to auscultation       Cardiovascular Exercise Tolerance: Good hypertension, pulmonary hypertensionNormal cardiovascular exam Rhythm:regular Rate:Normal  Diastolic dysfunction   Neuro/Psych   Anxiety     paraplegia  negative psych ROS   GI/Hepatic Neg liver ROS,GERD  ,,  Endo/Other  diabetes, Type 2    Renal/GU negative Renal ROS  negative genitourinary   Musculoskeletal  (+) Arthritis , Osteoarthritis,    Abdominal   Peds  Hematology  (+) Blood dyscrasia, anemia Hgb 8.2   Anesthesia Other Findings   Reproductive/Obstetrics negative OB ROS                              Anesthesia Physical Anesthesia Plan  ASA: 4  Anesthesia Plan: General   Post-op Pain Management: Minimal or no pain anticipated   Induction: Intravenous  PONV Risk Score and Plan: Propofol  infusion  Airway Management Planned: Nasal Cannula and Natural Airway  Additional Equipment: None  Intra-op Plan:   Post-operative Plan:   Informed Consent: I have reviewed the patients History and Physical, chart, labs and discussed the procedure including the risks, benefits and alternatives for the proposed anesthesia with the patient or authorized representative who has indicated his/her understanding and acceptance.     Dental Advisory Given  Plan Discussed with: CRNA  Anesthesia Plan Comments:           Anesthesia Quick Evaluation

## 2024-06-11 NOTE — Transfer of Care (Signed)
 Immediate Anesthesia Transfer of Care Note  Patient: Patrick Aguilar  Procedure(s) Performed: COLONOSCOPY  Patient Location: Short Stay  Anesthesia Type:General  Level of Consciousness: awake, alert , and patient cooperative  Airway & Oxygen  Therapy: Patient Spontanous Breathing  Post-op Assessment: Report given to RN and Post -op Vital signs reviewed and stable  Post vital signs: Reviewed and stable  Last Vitals:  Vitals Value Taken Time  BP 98/55 06/11/24 09:49  Temp 36.6 C 06/11/24 09:49  Pulse 82 06/11/24 09:49  Resp 14 06/11/24 09:49  SpO2 99 % 06/11/24 09:49    Last Pain:  Vitals:   06/11/24 0949  TempSrc: Oral  PainSc: 0-No pain         Complications: No notable events documented.

## 2024-06-11 NOTE — Op Note (Signed)
 [facility] Patient Name: Russell Quinney Procedure Date: 06/11/2024 8:45 AM MRN: [id number] Date of Birth: 08-01-1951 Attending MD: Toribio Fortune , , [id number] CSN: [id number] Age: 73 Admit Type: Outpatient Procedure:                Colonoscopy Indications:              Anemia Providers:                Toribio Fortune, Harlene Lips, Crystal Page Referring MD:              Medicines:                Monitored Anesthesia Care Complications:            No immediate complications. Estimated Blood Loss:     Estimated blood loss: none. Procedure:                Pre-Anesthesia Assessment:                           - Prior to the procedure, a History and Physical                            was performed, and patient medications, allergies                            and sensitivities were reviewed. The patient's                            tolerance of previous anesthesia was reviewed.                           - The risks and benefits of the procedure and the                            sedation options and risks were discussed with the                            patient. All questions were answered and informed                            consent was obtained.                           - ASA Grade Assessment: III - A patient with severe                            systemic disease.                           After obtaining informed consent, the colonoscope                            was passed under direct vision. Throughout the                            procedure, the patient's blood pressure, pulse, and  oxygen  saturations were monitored continuously. The                            [id number]-[id number] ([id number]) Peds Colon was introduced                            through the anus and advanced to the the cecum,                            identified by appendiceal orifice and ileocecal                            valve. The colonoscopy was performed without                             difficulty. The patient tolerated the procedure                            well. The quality of the bowel preparation was                            adequate. Scope In: 9:10:40 AM Scope Out: 9:45:06 AM Scope Withdrawal Time: 0 hours 23 minutes 30 seconds  Total Procedure Duration: 0 hours 34 minutes 26 seconds  Findings:      The perianal exam findings include a perianal rash.      A single medium-sized localized angiodysplastic lesion without bleeding       was found in the cecum. Coagulation for bleeding prevention using argon       plasma at 0.3 liters/minute and 20 watts was successful.      Four sessile polyps were found in the transverse colon and cecum. The       polyps were 2 to 6 mm in size. These polyps were removed with a cold       snare. Resection and retrieval were complete.      A 3 mm polyp was found in the descending colon. The polyp was sessile.       The polyp was removed with a cold snare. Resection and retrieval were       complete.      Scattered small-mouthed diverticula were found in the sigmoid colon and       ascending colon.      Non-bleeding internal hemorrhoids were found during retroflexion. The       hemorrhoids were small. Impression:               - Perianal rash found on perianal exam.                           - A single non-bleeding colonic angiodysplastic                            lesion. Treated with argon plasma coagulation (APC).                           - Four 2 to 6 mm polyps in the transverse colon and  in the cecum, removed with a cold snare. Resected                            and retrieved.                           - One 3 mm polyp in the descending colon, removed                            with a cold snare. Resected and retrieved.                           - Diverticulosis in the sigmoid colon and in the                            ascending colon.                           - Non-bleeding  internal hemorrhoids. Moderate Sedation:      Per Anesthesia Care Recommendation:           - Discharge patient to home (ambulatory).                           - Resume previous diet.                           - Await pathology results.                           - Repeat colonoscopy for surveillance based on                            pathology results.                           - Use topical clotrimazole for 2 weeks twice a days. Procedure Code(s):        --- Professional ---                           [phone], 59, Colonoscopy, flexible; with control of                            bleeding, any method                           45385, Colonoscopy, flexible; with removal of                            tumor(s), polyp(s), or other lesion(s) by snare                            technique Diagnosis Code(s):        --- Professional ---                           R21, Rash and other nonspecific  skin eruption                           K55.20, Angiodysplasia of colon without hemorrhage                           D12.3, Benign neoplasm of transverse colon (hepatic                            flexure or splenic flexure)                           D12.0, Benign neoplasm of cecum                           D12.4, Benign neoplasm of descending colon                           K64.8, Other hemorrhoids                           D64.9, Anemia, unspecified                           K57.30, Diverticulosis of large intestine without                            perforation or abscess without bleeding CPT copyright 2022 American Medical Association. All rights reserved. The codes documented in this report are preliminary and upon coder review may  be revised to meet current compliance requirements. Toribio Fortune, MD Toribio Fortune,  06/11/2024 9:57:24 AM This report has been signed electronically. Number of Addenda: 0

## 2024-06-11 NOTE — Discharge Instructions (Signed)
 You are being discharged to home.  Resume your previous diet.  We are waiting for your pathology results.  Your physician has recommended a repeat colonoscopy for surveillance based on pathology results.  Use topical clotrimazole for 2 weeks twice a days.

## 2024-06-11 NOTE — Anesthesia Postprocedure Evaluation (Signed)
 Anesthesia Post Note  Patient: Patrick Aguilar  Procedure(s) Performed: COLONOSCOPY  Patient location during evaluation: Phase II Anesthesia Type: General Level of consciousness: awake and alert Pain management: pain level controlled Vital Signs Assessment: post-procedure vital signs reviewed and stable Respiratory status: spontaneous breathing, nonlabored ventilation and respiratory function stable Cardiovascular status: stable Anesthetic complications: no   There were no known notable events for this encounter.   Last Vitals:  Vitals:   06/11/24 0949 06/11/24 0958  BP: (!) 98/55 117/61  Pulse: 82   Resp: 14   Temp: 36.6 C   SpO2: 99%     Last Pain:  Vitals:   06/11/24 0949  TempSrc: Oral  PainSc: 0-No pain                 [name]

## 2024-06-11 NOTE — Interval H&P Note (Signed)
 History and Physical Interval Note:  06/11/2024 8:43 AM  Patrick Aguilar  has presented today for surgery, with the diagnosis of anemia,history of colon polyps.  The various methods of treatment have been discussed with the patient and family. After consideration of risks, benefits and other options for treatment, the patient has consented to  Procedure(s) with comments: COLONOSCOPY (N/A) - 8:15 am, asa 3 as a surgical intervention.  The patient's history has been reviewed, patient examined, no change in status, stable for surgery.  I have reviewed the patient's chart and labs.  Questions were answered to the patient's satisfaction.     [name]

## 2024-06-12 ENCOUNTER — Encounter (HOSPITAL_COMMUNITY): Payer: Self-pay | Admitting: Gastroenterology

## 2024-06-12 ENCOUNTER — Ambulatory Visit (INDEPENDENT_AMBULATORY_CARE_PROVIDER_SITE_OTHER): Payer: Self-pay | Admitting: Gastroenterology

## 2024-06-12 LAB — SURGICAL PATHOLOGY

## 2024-06-13 NOTE — Progress Notes (Signed)
 5 yr TCS noted in recall Patient result letter mailed procedure note and pathology result faxed to PCP

## 2024-06-14 DIAGNOSIS — E119 Type 2 diabetes mellitus without complications: Secondary | ICD-10-CM | POA: Diagnosis not present

## 2024-06-14 DIAGNOSIS — Z23 Encounter for immunization: Secondary | ICD-10-CM | POA: Diagnosis not present

## 2024-06-14 DIAGNOSIS — G8220 Paraplegia, unspecified: Secondary | ICD-10-CM | POA: Diagnosis not present

## 2024-06-14 DIAGNOSIS — R52 Pain, unspecified: Secondary | ICD-10-CM | POA: Diagnosis not present

## 2024-06-14 DIAGNOSIS — I10 Essential (primary) hypertension: Secondary | ICD-10-CM | POA: Diagnosis not present

## 2024-06-14 DIAGNOSIS — Z299 Encounter for prophylactic measures, unspecified: Secondary | ICD-10-CM | POA: Diagnosis not present

## 2024-06-20 ENCOUNTER — Encounter (HOSPITAL_BASED_OUTPATIENT_CLINIC_OR_DEPARTMENT_OTHER): Attending: General Surgery | Admitting: General Surgery

## 2024-06-20 DIAGNOSIS — L89522 Pressure ulcer of left ankle, stage 2: Secondary | ICD-10-CM | POA: Insufficient documentation

## 2024-06-20 DIAGNOSIS — M86672 Other chronic osteomyelitis, left ankle and foot: Secondary | ICD-10-CM | POA: Diagnosis not present

## 2024-06-20 DIAGNOSIS — L89513 Pressure ulcer of right ankle, stage 3: Secondary | ICD-10-CM | POA: Diagnosis not present

## 2024-06-20 DIAGNOSIS — L97816 Non-pressure chronic ulcer of other part of right lower leg with bone involvement without evidence of necrosis: Secondary | ICD-10-CM | POA: Insufficient documentation

## 2024-06-20 DIAGNOSIS — G8220 Paraplegia, unspecified: Secondary | ICD-10-CM | POA: Insufficient documentation

## 2024-06-20 DIAGNOSIS — E11622 Type 2 diabetes mellitus with other skin ulcer: Secondary | ICD-10-CM | POA: Insufficient documentation

## 2024-06-24 ENCOUNTER — Encounter (HOSPITAL_BASED_OUTPATIENT_CLINIC_OR_DEPARTMENT_OTHER): Admitting: General Surgery

## 2024-06-24 DIAGNOSIS — E11622 Type 2 diabetes mellitus with other skin ulcer: Secondary | ICD-10-CM | POA: Diagnosis not present

## 2024-06-24 DIAGNOSIS — L97814 Non-pressure chronic ulcer of other part of right lower leg with necrosis of bone: Secondary | ICD-10-CM | POA: Diagnosis not present

## 2024-06-24 LAB — GLUCOSE, CAPILLARY
Glucose-Capillary: 206 mg/dL — ABNORMAL HIGH (ref 70–99)
Glucose-Capillary: 165 mg/dL — ABNORMAL HIGH (ref 70–99)

## 2024-06-25 ENCOUNTER — Encounter (HOSPITAL_BASED_OUTPATIENT_CLINIC_OR_DEPARTMENT_OTHER): Admitting: General Surgery

## 2024-06-25 DIAGNOSIS — E11622 Type 2 diabetes mellitus with other skin ulcer: Secondary | ICD-10-CM | POA: Diagnosis not present

## 2024-06-25 DIAGNOSIS — L97814 Non-pressure chronic ulcer of other part of right lower leg with necrosis of bone: Secondary | ICD-10-CM | POA: Diagnosis not present

## 2024-06-25 LAB — GLUCOSE, CAPILLARY
Glucose-Capillary: 161 mg/dL — ABNORMAL HIGH (ref 70–99)
Glucose-Capillary: 149 mg/dL — ABNORMAL HIGH (ref 70–99)

## 2024-06-26 ENCOUNTER — Encounter (HOSPITAL_BASED_OUTPATIENT_CLINIC_OR_DEPARTMENT_OTHER): Admitting: General Surgery

## 2024-06-26 DIAGNOSIS — L97814 Non-pressure chronic ulcer of other part of right lower leg with necrosis of bone: Secondary | ICD-10-CM | POA: Diagnosis not present

## 2024-06-26 DIAGNOSIS — E11622 Type 2 diabetes mellitus with other skin ulcer: Secondary | ICD-10-CM | POA: Diagnosis not present

## 2024-06-26 LAB — GLUCOSE, CAPILLARY
Glucose-Capillary: 196 mg/dL — ABNORMAL HIGH (ref 70–99)
Glucose-Capillary: 157 mg/dL — ABNORMAL HIGH (ref 70–99)

## 2024-06-27 ENCOUNTER — Encounter (HOSPITAL_BASED_OUTPATIENT_CLINIC_OR_DEPARTMENT_OTHER): Admitting: Internal Medicine

## 2024-06-27 DIAGNOSIS — E11622 Type 2 diabetes mellitus with other skin ulcer: Secondary | ICD-10-CM

## 2024-06-27 DIAGNOSIS — L97816 Non-pressure chronic ulcer of other part of right lower leg with bone involvement without evidence of necrosis: Secondary | ICD-10-CM | POA: Diagnosis not present

## 2024-06-27 DIAGNOSIS — L89513 Pressure ulcer of right ankle, stage 3: Secondary | ICD-10-CM

## 2024-06-27 DIAGNOSIS — M86672 Other chronic osteomyelitis, left ankle and foot: Secondary | ICD-10-CM | POA: Diagnosis not present

## 2024-06-27 LAB — GLUCOSE, CAPILLARY
Glucose-Capillary: 229 mg/dL — ABNORMAL HIGH (ref 70–99)
Glucose-Capillary: 123 mg/dL — ABNORMAL HIGH (ref 70–99)

## 2024-06-28 ENCOUNTER — Encounter (HOSPITAL_BASED_OUTPATIENT_CLINIC_OR_DEPARTMENT_OTHER): Admitting: Internal Medicine

## 2024-07-01 ENCOUNTER — Encounter (HOSPITAL_BASED_OUTPATIENT_CLINIC_OR_DEPARTMENT_OTHER): Admitting: General Surgery

## 2024-07-01 DIAGNOSIS — E11622 Type 2 diabetes mellitus with other skin ulcer: Secondary | ICD-10-CM | POA: Diagnosis not present

## 2024-07-01 DIAGNOSIS — L97814 Non-pressure chronic ulcer of other part of right lower leg with necrosis of bone: Secondary | ICD-10-CM | POA: Diagnosis not present

## 2024-07-01 LAB — GLUCOSE, CAPILLARY
Glucose-Capillary: 145 mg/dL — ABNORMAL HIGH (ref 70–99)
Glucose-Capillary: 174 mg/dL — ABNORMAL HIGH (ref 70–99)
Glucose-Capillary: 202 mg/dL — ABNORMAL HIGH (ref 70–99)

## 2024-07-02 ENCOUNTER — Encounter (HOSPITAL_BASED_OUTPATIENT_CLINIC_OR_DEPARTMENT_OTHER): Admitting: General Surgery

## 2024-07-02 DIAGNOSIS — H6993 Unspecified Eustachian tube disorder, bilateral: Secondary | ICD-10-CM | POA: Diagnosis not present

## 2024-07-02 DIAGNOSIS — E11622 Type 2 diabetes mellitus with other skin ulcer: Secondary | ICD-10-CM | POA: Diagnosis not present

## 2024-07-02 DIAGNOSIS — H6503 Acute serous otitis media, bilateral: Secondary | ICD-10-CM | POA: Diagnosis not present

## 2024-07-02 DIAGNOSIS — L97814 Non-pressure chronic ulcer of other part of right lower leg with necrosis of bone: Secondary | ICD-10-CM | POA: Diagnosis not present

## 2024-07-02 DIAGNOSIS — H906 Mixed conductive and sensorineural hearing loss, bilateral: Secondary | ICD-10-CM | POA: Diagnosis not present

## 2024-07-02 DIAGNOSIS — L97909 Non-pressure chronic ulcer of unspecified part of unspecified lower leg with unspecified severity: Secondary | ICD-10-CM | POA: Diagnosis not present

## 2024-07-02 LAB — GLUCOSE, CAPILLARY
Glucose-Capillary: 239 mg/dL — ABNORMAL HIGH (ref 70–99)
Glucose-Capillary: 170 mg/dL — ABNORMAL HIGH (ref 70–99)

## 2024-07-02 NOTE — Progress Notes (Signed)
 [facility]  Patient Name: Patrick Aguilar   Patient DOB: November 16, 1950                Patient Age: 73 y.o.    Reason for Visit: Patrick Aguilar was worked-in for a hearing test at the request of Zachary Vandegriend, MD given concerns of difficulty hearing in both ears. Grandson reports that Patrick Aguilar has a history of occupational noise exposure.   Results: Refer to results scanned into Media tab.  Otoscopy showed clear canals with erythematous tympanic membranes with yellow appearing fluid in the middle ear space on both ears.   Tympanometry: Tympanometry was completed due to abnormal otoscopy. Results were: Right Ear: Type B - Normal ear canal volume with limited eardrum mobility. Left Ear: Type B - Normal ear canal volume with limited eardrum mobility.  Audiometric Results: Results were obtained using headphones. Test reliability was good.  Test was completed in Sound [place] 1 and printed out using [employer] Suite. The Sound [place] was last calibrated on June 05, 2024.  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 Left ear is slightly worse than right ear regarding air conduction thresholds and WRS as indicated below.   Speech Reception Thresholds: Completed using monitored live voice. Right Ear: 50 dBHL; which is in agreement with PTA.  Left Ear: 50 dBHL; which is in agreement with PTA.   Word Recognition Testing: Completed using a recording of CID-W22. Right Ear: 80 % at 80 dBHL with 60 dBem, which is a compensated listening level. Left Ear: 64 % at 90 dBHL with 60 dBem, which is a compensated listening level.  Recommendations: Recommend repeat hearing test per ENT, sooner with concerns of sudden hearing loss, deteriorating hearing, or new or worsening tinnitus. Recommend proper use of adequate hearing protection in hazardous noise. Recommend trial with amplification  after medical clearance

## 2024-07-03 ENCOUNTER — Encounter (HOSPITAL_BASED_OUTPATIENT_CLINIC_OR_DEPARTMENT_OTHER): Admitting: General Surgery

## 2024-07-03 DIAGNOSIS — E11622 Type 2 diabetes mellitus with other skin ulcer: Secondary | ICD-10-CM | POA: Diagnosis not present

## 2024-07-03 DIAGNOSIS — L97814 Non-pressure chronic ulcer of other part of right lower leg with necrosis of bone: Secondary | ICD-10-CM | POA: Diagnosis not present

## 2024-07-03 LAB — GLUCOSE, CAPILLARY
Glucose-Capillary: 151 mg/dL — ABNORMAL HIGH (ref 70–99)
Glucose-Capillary: 121 mg/dL — ABNORMAL HIGH (ref 70–99)

## 2024-07-04 ENCOUNTER — Encounter (HOSPITAL_BASED_OUTPATIENT_CLINIC_OR_DEPARTMENT_OTHER): Admitting: General Surgery

## 2024-07-04 DIAGNOSIS — L97812 Non-pressure chronic ulcer of other part of right lower leg with fat layer exposed: Secondary | ICD-10-CM | POA: Diagnosis not present

## 2024-07-04 DIAGNOSIS — E11622 Type 2 diabetes mellitus with other skin ulcer: Secondary | ICD-10-CM | POA: Diagnosis not present

## 2024-07-04 DIAGNOSIS — L97814 Non-pressure chronic ulcer of other part of right lower leg with necrosis of bone: Secondary | ICD-10-CM | POA: Diagnosis not present

## 2024-07-04 DIAGNOSIS — L89510 Pressure ulcer of right ankle, unstageable: Secondary | ICD-10-CM | POA: Diagnosis not present

## 2024-07-04 LAB — GLUCOSE, CAPILLARY
Glucose-Capillary: 208 mg/dL — ABNORMAL HIGH (ref 70–99)
Glucose-Capillary: 185 mg/dL — ABNORMAL HIGH (ref 70–99)

## 2024-07-05 ENCOUNTER — Encounter (HOSPITAL_BASED_OUTPATIENT_CLINIC_OR_DEPARTMENT_OTHER): Admitting: General Surgery

## 2024-07-05 DIAGNOSIS — L97814 Non-pressure chronic ulcer of other part of right lower leg with necrosis of bone: Secondary | ICD-10-CM | POA: Diagnosis not present

## 2024-07-05 DIAGNOSIS — E11622 Type 2 diabetes mellitus with other skin ulcer: Secondary | ICD-10-CM | POA: Diagnosis not present

## 2024-07-05 LAB — GLUCOSE, CAPILLARY: Glucose-Capillary: 234 mg/dL — ABNORMAL HIGH (ref 70–99)

## 2024-07-08 ENCOUNTER — Encounter (HOSPITAL_BASED_OUTPATIENT_CLINIC_OR_DEPARTMENT_OTHER): Admitting: General Surgery

## 2024-07-08 DIAGNOSIS — G894 Chronic pain syndrome: Secondary | ICD-10-CM | POA: Diagnosis not present

## 2024-07-08 DIAGNOSIS — Z5181 Encounter for therapeutic drug level monitoring: Secondary | ICD-10-CM | POA: Diagnosis not present

## 2024-07-08 DIAGNOSIS — L97824 Non-pressure chronic ulcer of other part of left lower leg with necrosis of bone: Secondary | ICD-10-CM | POA: Diagnosis not present

## 2024-07-08 DIAGNOSIS — G9520 Unspecified cord compression: Secondary | ICD-10-CM | POA: Diagnosis not present

## 2024-07-08 DIAGNOSIS — E11622 Type 2 diabetes mellitus with other skin ulcer: Secondary | ICD-10-CM | POA: Diagnosis not present

## 2024-07-08 DIAGNOSIS — M4714 Other spondylosis with myelopathy, thoracic region: Secondary | ICD-10-CM | POA: Diagnosis not present

## 2024-07-08 DIAGNOSIS — L97814 Non-pressure chronic ulcer of other part of right lower leg with necrosis of bone: Secondary | ICD-10-CM | POA: Diagnosis not present

## 2024-07-08 DIAGNOSIS — Z79891 Long term (current) use of opiate analgesic: Secondary | ICD-10-CM | POA: Diagnosis not present

## 2024-07-08 DIAGNOSIS — M48062 Spinal stenosis, lumbar region with neurogenic claudication: Secondary | ICD-10-CM | POA: Diagnosis not present

## 2024-07-08 LAB — GLUCOSE, CAPILLARY
Glucose-Capillary: 194 mg/dL — ABNORMAL HIGH (ref 70–99)
Glucose-Capillary: 167 mg/dL — ABNORMAL HIGH (ref 70–99)
Glucose-Capillary: 190 mg/dL — ABNORMAL HIGH (ref 70–99)

## 2024-07-09 ENCOUNTER — Encounter (HOSPITAL_BASED_OUTPATIENT_CLINIC_OR_DEPARTMENT_OTHER): Admitting: General Surgery

## 2024-07-09 DIAGNOSIS — L97814 Non-pressure chronic ulcer of other part of right lower leg with necrosis of bone: Secondary | ICD-10-CM | POA: Diagnosis not present

## 2024-07-09 DIAGNOSIS — E11622 Type 2 diabetes mellitus with other skin ulcer: Secondary | ICD-10-CM | POA: Diagnosis not present

## 2024-07-09 LAB — GLUCOSE, CAPILLARY
Glucose-Capillary: 167 mg/dL — ABNORMAL HIGH (ref 70–99)
Glucose-Capillary: 132 mg/dL — ABNORMAL HIGH (ref 70–99)

## 2024-07-10 ENCOUNTER — Encounter (HOSPITAL_BASED_OUTPATIENT_CLINIC_OR_DEPARTMENT_OTHER): Admitting: General Surgery

## 2024-07-10 DIAGNOSIS — L97814 Non-pressure chronic ulcer of other part of right lower leg with necrosis of bone: Secondary | ICD-10-CM | POA: Diagnosis not present

## 2024-07-10 DIAGNOSIS — E11622 Type 2 diabetes mellitus with other skin ulcer: Secondary | ICD-10-CM | POA: Diagnosis not present

## 2024-07-10 LAB — GLUCOSE, CAPILLARY: Glucose-Capillary: 186 mg/dL — ABNORMAL HIGH (ref 70–99)

## 2024-07-11 ENCOUNTER — Encounter (HOSPITAL_BASED_OUTPATIENT_CLINIC_OR_DEPARTMENT_OTHER): Admitting: General Surgery

## 2024-07-11 DIAGNOSIS — E11622 Type 2 diabetes mellitus with other skin ulcer: Secondary | ICD-10-CM | POA: Diagnosis not present

## 2024-07-11 LAB — GLUCOSE, CAPILLARY
Glucose-Capillary: 152 mg/dL — ABNORMAL HIGH (ref 70–99)
Glucose-Capillary: 244 mg/dL — ABNORMAL HIGH (ref 70–99)
Glucose-Capillary: 147 mg/dL — ABNORMAL HIGH (ref 70–99)

## 2024-07-12 ENCOUNTER — Encounter (HOSPITAL_BASED_OUTPATIENT_CLINIC_OR_DEPARTMENT_OTHER): Admitting: General Surgery

## 2024-07-12 DIAGNOSIS — E11622 Type 2 diabetes mellitus with other skin ulcer: Secondary | ICD-10-CM | POA: Diagnosis not present

## 2024-07-12 LAB — GLUCOSE, CAPILLARY: Glucose-Capillary: 244 mg/dL — ABNORMAL HIGH (ref 70–99)

## 2024-07-15 ENCOUNTER — Encounter (HOSPITAL_BASED_OUTPATIENT_CLINIC_OR_DEPARTMENT_OTHER): Attending: General Surgery | Admitting: General Surgery

## 2024-07-15 DIAGNOSIS — G8220 Paraplegia, unspecified: Secondary | ICD-10-CM | POA: Diagnosis not present

## 2024-07-15 DIAGNOSIS — M86672 Other chronic osteomyelitis, left ankle and foot: Secondary | ICD-10-CM | POA: Insufficient documentation

## 2024-07-15 DIAGNOSIS — L89524 Pressure ulcer of left ankle, stage 4: Secondary | ICD-10-CM | POA: Diagnosis not present

## 2024-07-15 DIAGNOSIS — L97816 Non-pressure chronic ulcer of other part of right lower leg with bone involvement without evidence of necrosis: Secondary | ICD-10-CM | POA: Insufficient documentation

## 2024-07-15 DIAGNOSIS — L89514 Pressure ulcer of right ankle, stage 4: Secondary | ICD-10-CM | POA: Insufficient documentation

## 2024-07-15 DIAGNOSIS — E11622 Type 2 diabetes mellitus with other skin ulcer: Secondary | ICD-10-CM | POA: Insufficient documentation

## 2024-07-15 LAB — GLUCOSE, CAPILLARY
Glucose-Capillary: 211 mg/dL — ABNORMAL HIGH (ref 70–99)
Glucose-Capillary: 180 mg/dL — ABNORMAL HIGH (ref 70–99)
Glucose-Capillary: 132 mg/dL — ABNORMAL HIGH (ref 70–99)

## 2024-07-16 ENCOUNTER — Encounter (HOSPITAL_BASED_OUTPATIENT_CLINIC_OR_DEPARTMENT_OTHER): Admitting: General Surgery

## 2024-07-17 ENCOUNTER — Encounter (HOSPITAL_BASED_OUTPATIENT_CLINIC_OR_DEPARTMENT_OTHER): Admitting: General Surgery

## 2024-07-17 DIAGNOSIS — E11622 Type 2 diabetes mellitus with other skin ulcer: Secondary | ICD-10-CM | POA: Diagnosis not present

## 2024-07-17 LAB — GLUCOSE, CAPILLARY
Glucose-Capillary: 210 mg/dL — ABNORMAL HIGH (ref 70–99)
Glucose-Capillary: 157 mg/dL — ABNORMAL HIGH (ref 70–99)

## 2024-07-18 ENCOUNTER — Encounter (HOSPITAL_BASED_OUTPATIENT_CLINIC_OR_DEPARTMENT_OTHER): Admitting: General Surgery

## 2024-07-18 ENCOUNTER — Encounter (INDEPENDENT_AMBULATORY_CARE_PROVIDER_SITE_OTHER): Payer: Self-pay | Admitting: Gastroenterology

## 2024-07-18 DIAGNOSIS — E11622 Type 2 diabetes mellitus with other skin ulcer: Secondary | ICD-10-CM | POA: Diagnosis not present

## 2024-07-18 LAB — GLUCOSE, CAPILLARY: Glucose-Capillary: 209 mg/dL — ABNORMAL HIGH (ref 70–99)

## 2024-07-19 ENCOUNTER — Encounter (HOSPITAL_BASED_OUTPATIENT_CLINIC_OR_DEPARTMENT_OTHER): Admitting: General Surgery

## 2024-07-19 DIAGNOSIS — E11622 Type 2 diabetes mellitus with other skin ulcer: Secondary | ICD-10-CM | POA: Diagnosis not present

## 2024-07-19 LAB — GLUCOSE, CAPILLARY
Glucose-Capillary: 142 mg/dL — ABNORMAL HIGH (ref 70–99)
Glucose-Capillary: 147 mg/dL — ABNORMAL HIGH (ref 70–99)
Glucose-Capillary: 116 mg/dL — ABNORMAL HIGH (ref 70–99)

## 2024-07-22 ENCOUNTER — Encounter (HOSPITAL_BASED_OUTPATIENT_CLINIC_OR_DEPARTMENT_OTHER): Admitting: General Surgery

## 2024-07-22 DIAGNOSIS — E11622 Type 2 diabetes mellitus with other skin ulcer: Secondary | ICD-10-CM | POA: Diagnosis not present

## 2024-07-22 LAB — GLUCOSE, CAPILLARY
Glucose-Capillary: 306 mg/dL — ABNORMAL HIGH (ref 70–99)
Glucose-Capillary: 206 mg/dL — ABNORMAL HIGH (ref 70–99)

## 2024-07-23 ENCOUNTER — Encounter (HOSPITAL_BASED_OUTPATIENT_CLINIC_OR_DEPARTMENT_OTHER): Admitting: General Surgery

## 2024-07-23 DIAGNOSIS — E11622 Type 2 diabetes mellitus with other skin ulcer: Secondary | ICD-10-CM | POA: Diagnosis not present

## 2024-07-23 LAB — GLUCOSE, CAPILLARY
Glucose-Capillary: 149 mg/dL — ABNORMAL HIGH (ref 70–99)
Glucose-Capillary: 110 mg/dL — ABNORMAL HIGH (ref 70–99)

## 2024-07-24 ENCOUNTER — Encounter (HOSPITAL_BASED_OUTPATIENT_CLINIC_OR_DEPARTMENT_OTHER): Admitting: General Surgery

## 2024-07-24 DIAGNOSIS — E11622 Type 2 diabetes mellitus with other skin ulcer: Secondary | ICD-10-CM | POA: Diagnosis not present

## 2024-07-24 LAB — GLUCOSE, CAPILLARY
Glucose-Capillary: 177 mg/dL — ABNORMAL HIGH (ref 70–99)
Glucose-Capillary: 156 mg/dL — ABNORMAL HIGH (ref 70–99)
Glucose-Capillary: 130 mg/dL — ABNORMAL HIGH (ref 70–99)

## 2024-07-25 ENCOUNTER — Encounter (HOSPITAL_BASED_OUTPATIENT_CLINIC_OR_DEPARTMENT_OTHER): Admitting: General Surgery

## 2024-07-25 DIAGNOSIS — E11622 Type 2 diabetes mellitus with other skin ulcer: Secondary | ICD-10-CM | POA: Diagnosis not present

## 2024-07-25 LAB — GLUCOSE, CAPILLARY
Glucose-Capillary: 171 mg/dL — ABNORMAL HIGH (ref 70–99)
Glucose-Capillary: 98 mg/dL (ref 70–99)

## 2024-07-26 ENCOUNTER — Encounter (HOSPITAL_BASED_OUTPATIENT_CLINIC_OR_DEPARTMENT_OTHER): Admitting: General Surgery

## 2024-07-26 DIAGNOSIS — E11622 Type 2 diabetes mellitus with other skin ulcer: Secondary | ICD-10-CM | POA: Diagnosis not present

## 2024-07-26 LAB — GLUCOSE, CAPILLARY
Glucose-Capillary: 233 mg/dL — ABNORMAL HIGH (ref 70–99)
Glucose-Capillary: 214 mg/dL — ABNORMAL HIGH (ref 70–99)

## 2024-07-29 ENCOUNTER — Encounter (HOSPITAL_BASED_OUTPATIENT_CLINIC_OR_DEPARTMENT_OTHER): Admitting: General Surgery

## 2024-07-29 DIAGNOSIS — E11622 Type 2 diabetes mellitus with other skin ulcer: Secondary | ICD-10-CM | POA: Diagnosis not present

## 2024-07-29 LAB — GLUCOSE, CAPILLARY
Glucose-Capillary: 239 mg/dL — ABNORMAL HIGH (ref 70–99)
Glucose-Capillary: 232 mg/dL — ABNORMAL HIGH (ref 70–99)

## 2024-07-30 ENCOUNTER — Encounter (HOSPITAL_BASED_OUTPATIENT_CLINIC_OR_DEPARTMENT_OTHER): Admitting: General Surgery

## 2024-07-30 DIAGNOSIS — E11622 Type 2 diabetes mellitus with other skin ulcer: Secondary | ICD-10-CM | POA: Diagnosis not present

## 2024-07-30 LAB — GLUCOSE, CAPILLARY
Glucose-Capillary: 213 mg/dL — ABNORMAL HIGH (ref 70–99)
Glucose-Capillary: 128 mg/dL — ABNORMAL HIGH (ref 70–99)

## 2024-07-31 ENCOUNTER — Encounter (HOSPITAL_BASED_OUTPATIENT_CLINIC_OR_DEPARTMENT_OTHER): Admitting: General Surgery

## 2024-07-31 DIAGNOSIS — E11622 Type 2 diabetes mellitus with other skin ulcer: Secondary | ICD-10-CM | POA: Diagnosis not present

## 2024-07-31 LAB — GLUCOSE, CAPILLARY
Glucose-Capillary: 177 mg/dL — ABNORMAL HIGH (ref 70–99)
Glucose-Capillary: 142 mg/dL — ABNORMAL HIGH (ref 70–99)

## 2024-08-01 ENCOUNTER — Encounter (HOSPITAL_BASED_OUTPATIENT_CLINIC_OR_DEPARTMENT_OTHER): Admitting: General Surgery

## 2024-08-01 DIAGNOSIS — E11622 Type 2 diabetes mellitus with other skin ulcer: Secondary | ICD-10-CM | POA: Diagnosis not present

## 2024-08-01 LAB — GLUCOSE, CAPILLARY
Glucose-Capillary: 173 mg/dL — ABNORMAL HIGH (ref 70–99)
Glucose-Capillary: 190 mg/dL — ABNORMAL HIGH (ref 70–99)
Glucose-Capillary: 137 mg/dL — ABNORMAL HIGH (ref 70–99)

## 2024-08-02 ENCOUNTER — Encounter (HOSPITAL_BASED_OUTPATIENT_CLINIC_OR_DEPARTMENT_OTHER): Admitting: General Surgery

## 2024-08-02 DIAGNOSIS — E11622 Type 2 diabetes mellitus with other skin ulcer: Secondary | ICD-10-CM | POA: Diagnosis not present

## 2024-08-05 ENCOUNTER — Encounter (HOSPITAL_BASED_OUTPATIENT_CLINIC_OR_DEPARTMENT_OTHER): Admitting: General Surgery

## 2024-08-05 DIAGNOSIS — E11622 Type 2 diabetes mellitus with other skin ulcer: Secondary | ICD-10-CM | POA: Diagnosis not present

## 2024-08-05 LAB — GLUCOSE, CAPILLARY
Glucose-Capillary: 158 mg/dL — ABNORMAL HIGH (ref 70–99)
Glucose-Capillary: 157 mg/dL — ABNORMAL HIGH (ref 70–99)
Glucose-Capillary: 217 mg/dL — ABNORMAL HIGH (ref 70–99)
Glucose-Capillary: 182 mg/dL — ABNORMAL HIGH (ref 70–99)

## 2024-08-06 ENCOUNTER — Encounter (HOSPITAL_BASED_OUTPATIENT_CLINIC_OR_DEPARTMENT_OTHER): Admitting: General Surgery

## 2024-08-06 DIAGNOSIS — E11622 Type 2 diabetes mellitus with other skin ulcer: Secondary | ICD-10-CM | POA: Diagnosis not present

## 2024-08-06 LAB — GLUCOSE, CAPILLARY
Glucose-Capillary: 223 mg/dL — ABNORMAL HIGH (ref 70–99)
Glucose-Capillary: 128 mg/dL — ABNORMAL HIGH (ref 70–99)

## 2024-08-07 ENCOUNTER — Encounter (HOSPITAL_BASED_OUTPATIENT_CLINIC_OR_DEPARTMENT_OTHER): Admitting: General Surgery

## 2024-08-07 DIAGNOSIS — E11622 Type 2 diabetes mellitus with other skin ulcer: Secondary | ICD-10-CM | POA: Diagnosis not present

## 2024-08-07 LAB — GLUCOSE, CAPILLARY
Glucose-Capillary: 332 mg/dL — ABNORMAL HIGH (ref 70–99)
Glucose-Capillary: 264 mg/dL — ABNORMAL HIGH (ref 70–99)

## 2024-08-12 ENCOUNTER — Encounter (HOSPITAL_BASED_OUTPATIENT_CLINIC_OR_DEPARTMENT_OTHER): Admitting: General Surgery

## 2024-08-12 DIAGNOSIS — L89526 Pressure-induced deep tissue damage of left ankle: Secondary | ICD-10-CM | POA: Diagnosis not present

## 2024-08-12 DIAGNOSIS — L97822 Non-pressure chronic ulcer of other part of left lower leg with fat layer exposed: Secondary | ICD-10-CM | POA: Insufficient documentation

## 2024-08-12 DIAGNOSIS — L89514 Pressure ulcer of right ankle, stage 4: Secondary | ICD-10-CM | POA: Diagnosis not present

## 2024-08-12 DIAGNOSIS — E11622 Type 2 diabetes mellitus with other skin ulcer: Secondary | ICD-10-CM | POA: Diagnosis present

## 2024-08-12 DIAGNOSIS — G8220 Paraplegia, unspecified: Secondary | ICD-10-CM | POA: Insufficient documentation

## 2024-08-12 DIAGNOSIS — M86672 Other chronic osteomyelitis, left ankle and foot: Secondary | ICD-10-CM | POA: Diagnosis not present

## 2024-08-12 DIAGNOSIS — E1169 Type 2 diabetes mellitus with other specified complication: Secondary | ICD-10-CM | POA: Insufficient documentation

## 2024-08-12 DIAGNOSIS — L89524 Pressure ulcer of left ankle, stage 4: Secondary | ICD-10-CM | POA: Diagnosis not present

## 2024-08-12 DIAGNOSIS — L97816 Non-pressure chronic ulcer of other part of right lower leg with bone involvement without evidence of necrosis: Secondary | ICD-10-CM | POA: Insufficient documentation

## 2024-08-12 LAB — GLUCOSE, CAPILLARY
Glucose-Capillary: 140 mg/dL — ABNORMAL HIGH (ref 70–99)
Glucose-Capillary: 116 mg/dL — ABNORMAL HIGH (ref 70–99)

## 2024-08-13 ENCOUNTER — Encounter (HOSPITAL_BASED_OUTPATIENT_CLINIC_OR_DEPARTMENT_OTHER): Admitting: General Surgery

## 2024-08-13 DIAGNOSIS — E11622 Type 2 diabetes mellitus with other skin ulcer: Secondary | ICD-10-CM | POA: Diagnosis not present

## 2024-08-13 LAB — GLUCOSE, CAPILLARY
Glucose-Capillary: 197 mg/dL — ABNORMAL HIGH (ref 70–99)
Glucose-Capillary: 106 mg/dL — ABNORMAL HIGH (ref 70–99)

## 2024-08-14 ENCOUNTER — Encounter (HOSPITAL_BASED_OUTPATIENT_CLINIC_OR_DEPARTMENT_OTHER): Admitting: General Surgery

## 2024-08-14 DIAGNOSIS — E11622 Type 2 diabetes mellitus with other skin ulcer: Secondary | ICD-10-CM | POA: Diagnosis not present

## 2024-08-15 ENCOUNTER — Encounter (HOSPITAL_BASED_OUTPATIENT_CLINIC_OR_DEPARTMENT_OTHER): Admitting: General Surgery

## 2024-08-15 DIAGNOSIS — E11622 Type 2 diabetes mellitus with other skin ulcer: Secondary | ICD-10-CM | POA: Diagnosis not present

## 2024-08-15 LAB — GLUCOSE, CAPILLARY
Glucose-Capillary: 210 mg/dL — ABNORMAL HIGH (ref 70–99)
Glucose-Capillary: 111 mg/dL — ABNORMAL HIGH (ref 70–99)
Glucose-Capillary: 113 mg/dL — ABNORMAL HIGH (ref 70–99)

## 2024-08-16 ENCOUNTER — Encounter (HOSPITAL_BASED_OUTPATIENT_CLINIC_OR_DEPARTMENT_OTHER): Admitting: General Surgery

## 2024-08-16 LAB — GLUCOSE, CAPILLARY
Glucose-Capillary: 132 mg/dL — ABNORMAL HIGH (ref 70–99)
Glucose-Capillary: 141 mg/dL — ABNORMAL HIGH (ref 70–99)

## 2024-08-19 ENCOUNTER — Encounter (HOSPITAL_BASED_OUTPATIENT_CLINIC_OR_DEPARTMENT_OTHER): Admitting: Internal Medicine

## 2024-08-20 ENCOUNTER — Encounter (HOSPITAL_BASED_OUTPATIENT_CLINIC_OR_DEPARTMENT_OTHER): Admitting: Internal Medicine

## 2024-08-20 DIAGNOSIS — L89514 Pressure ulcer of right ankle, stage 4: Secondary | ICD-10-CM | POA: Diagnosis not present

## 2024-08-20 DIAGNOSIS — M86672 Other chronic osteomyelitis, left ankle and foot: Secondary | ICD-10-CM | POA: Diagnosis not present

## 2024-08-20 DIAGNOSIS — L97816 Non-pressure chronic ulcer of other part of right lower leg with bone involvement without evidence of necrosis: Secondary | ICD-10-CM | POA: Diagnosis not present

## 2024-08-20 DIAGNOSIS — E11622 Type 2 diabetes mellitus with other skin ulcer: Secondary | ICD-10-CM | POA: Diagnosis not present

## 2024-08-20 LAB — GLUCOSE, CAPILLARY: Glucose-Capillary: 103 mg/dL — ABNORMAL HIGH (ref 70–99)

## 2024-08-21 ENCOUNTER — Encounter (HOSPITAL_BASED_OUTPATIENT_CLINIC_OR_DEPARTMENT_OTHER): Admitting: Internal Medicine

## 2024-08-21 DIAGNOSIS — E11622 Type 2 diabetes mellitus with other skin ulcer: Secondary | ICD-10-CM

## 2024-08-21 DIAGNOSIS — L89514 Pressure ulcer of right ankle, stage 4: Secondary | ICD-10-CM

## 2024-08-21 DIAGNOSIS — L97816 Non-pressure chronic ulcer of other part of right lower leg with bone involvement without evidence of necrosis: Secondary | ICD-10-CM | POA: Diagnosis not present

## 2024-08-21 DIAGNOSIS — M86672 Other chronic osteomyelitis, left ankle and foot: Secondary | ICD-10-CM | POA: Diagnosis not present

## 2024-08-21 LAB — GLUCOSE, CAPILLARY
Glucose-Capillary: 104 mg/dL — ABNORMAL HIGH (ref 70–99)
Glucose-Capillary: 145 mg/dL — ABNORMAL HIGH (ref 70–99)
Glucose-Capillary: 121 mg/dL — ABNORMAL HIGH (ref 70–99)
Glucose-Capillary: 215 mg/dL — ABNORMAL HIGH (ref 70–99)
Glucose-Capillary: 165 mg/dL — ABNORMAL HIGH (ref 70–99)

## 2024-08-22 ENCOUNTER — Encounter (HOSPITAL_BASED_OUTPATIENT_CLINIC_OR_DEPARTMENT_OTHER): Admitting: Internal Medicine

## 2024-08-22 DIAGNOSIS — L97816 Non-pressure chronic ulcer of other part of right lower leg with bone involvement without evidence of necrosis: Secondary | ICD-10-CM

## 2024-08-22 DIAGNOSIS — E11622 Type 2 diabetes mellitus with other skin ulcer: Secondary | ICD-10-CM

## 2024-08-22 DIAGNOSIS — L89514 Pressure ulcer of right ankle, stage 4: Secondary | ICD-10-CM

## 2024-08-22 LAB — GLUCOSE, CAPILLARY: Glucose-Capillary: 176 mg/dL — ABNORMAL HIGH (ref 70–99)

## 2024-08-26 ENCOUNTER — Encounter (HOSPITAL_BASED_OUTPATIENT_CLINIC_OR_DEPARTMENT_OTHER): Admitting: Internal Medicine

## 2024-08-26 DIAGNOSIS — E11622 Type 2 diabetes mellitus with other skin ulcer: Secondary | ICD-10-CM | POA: Diagnosis not present

## 2024-08-26 LAB — GLUCOSE, CAPILLARY
Glucose-Capillary: 118 mg/dL — ABNORMAL HIGH (ref 70–99)
Glucose-Capillary: 160 mg/dL — ABNORMAL HIGH (ref 70–99)
Glucose-Capillary: 134 mg/dL — ABNORMAL HIGH (ref 70–99)

## 2024-08-27 ENCOUNTER — Encounter (HOSPITAL_BASED_OUTPATIENT_CLINIC_OR_DEPARTMENT_OTHER): Admitting: General Surgery

## 2024-08-27 DIAGNOSIS — E11622 Type 2 diabetes mellitus with other skin ulcer: Secondary | ICD-10-CM | POA: Diagnosis not present

## 2024-08-27 LAB — GLUCOSE, CAPILLARY
Glucose-Capillary: 168 mg/dL — ABNORMAL HIGH (ref 70–99)
Glucose-Capillary: 80 mg/dL (ref 70–99)
Glucose-Capillary: 87 mg/dL (ref 70–99)
Glucose-Capillary: 90 mg/dL (ref 70–99)
Glucose-Capillary: 92 mg/dL (ref 70–99)
Glucose-Capillary: 110 mg/dL — ABNORMAL HIGH (ref 70–99)

## 2024-08-28 ENCOUNTER — Encounter (HOSPITAL_BASED_OUTPATIENT_CLINIC_OR_DEPARTMENT_OTHER): Admitting: General Surgery

## 2024-08-29 ENCOUNTER — Encounter (HOSPITAL_BASED_OUTPATIENT_CLINIC_OR_DEPARTMENT_OTHER): Admitting: General Surgery

## 2024-08-29 DIAGNOSIS — E11622 Type 2 diabetes mellitus with other skin ulcer: Secondary | ICD-10-CM | POA: Diagnosis not present

## 2024-08-29 LAB — GLUCOSE, CAPILLARY: Glucose-Capillary: 140 mg/dL — ABNORMAL HIGH (ref 70–99)

## 2024-08-30 ENCOUNTER — Encounter (HOSPITAL_BASED_OUTPATIENT_CLINIC_OR_DEPARTMENT_OTHER): Admitting: General Surgery

## 2024-08-30 DIAGNOSIS — E11622 Type 2 diabetes mellitus with other skin ulcer: Secondary | ICD-10-CM | POA: Diagnosis not present

## 2024-08-30 LAB — GLUCOSE, CAPILLARY
Glucose-Capillary: 103 mg/dL — ABNORMAL HIGH (ref 70–99)
Glucose-Capillary: 127 mg/dL — ABNORMAL HIGH (ref 70–99)
Glucose-Capillary: 84 mg/dL (ref 70–99)
Glucose-Capillary: 100 mg/dL — ABNORMAL HIGH (ref 70–99)

## 2024-09-02 LAB — GLUCOSE, CAPILLARY
Glucose-Capillary: 141 mg/dL — ABNORMAL HIGH (ref 70–99)
Glucose-Capillary: 108 mg/dL — ABNORMAL HIGH (ref 70–99)

## 2024-09-03 ENCOUNTER — Encounter (HOSPITAL_BASED_OUTPATIENT_CLINIC_OR_DEPARTMENT_OTHER): Admitting: General Surgery

## 2024-09-11 ENCOUNTER — Encounter: Payer: Self-pay | Admitting: Gastroenterology

## 2024-09-16 ENCOUNTER — Encounter (HOSPITAL_BASED_OUTPATIENT_CLINIC_OR_DEPARTMENT_OTHER): Admitting: General Surgery

## 2024-09-16 ENCOUNTER — Encounter (HOSPITAL_BASED_OUTPATIENT_CLINIC_OR_DEPARTMENT_OTHER): Attending: General Surgery | Admitting: General Surgery

## 2024-09-16 DIAGNOSIS — L89526 Pressure-induced deep tissue damage of left ankle: Secondary | ICD-10-CM | POA: Diagnosis not present

## 2024-09-16 DIAGNOSIS — G8220 Paraplegia, unspecified: Secondary | ICD-10-CM | POA: Insufficient documentation

## 2024-09-16 DIAGNOSIS — L97816 Non-pressure chronic ulcer of other part of right lower leg with bone involvement without evidence of necrosis: Secondary | ICD-10-CM | POA: Diagnosis not present

## 2024-09-16 DIAGNOSIS — M86672 Other chronic osteomyelitis, left ankle and foot: Secondary | ICD-10-CM | POA: Insufficient documentation

## 2024-09-16 DIAGNOSIS — L89524 Pressure ulcer of left ankle, stage 4: Secondary | ICD-10-CM | POA: Diagnosis not present

## 2024-09-16 DIAGNOSIS — E11622 Type 2 diabetes mellitus with other skin ulcer: Secondary | ICD-10-CM | POA: Diagnosis present

## 2024-09-16 DIAGNOSIS — L97812 Non-pressure chronic ulcer of other part of right lower leg with fat layer exposed: Secondary | ICD-10-CM | POA: Insufficient documentation

## 2024-09-16 DIAGNOSIS — L89514 Pressure ulcer of right ankle, stage 4: Secondary | ICD-10-CM | POA: Diagnosis not present

## 2024-09-16 DIAGNOSIS — L97822 Non-pressure chronic ulcer of other part of left lower leg with fat layer exposed: Secondary | ICD-10-CM | POA: Diagnosis not present

## 2024-09-16 LAB — GLUCOSE, CAPILLARY
Glucose-Capillary: 228 mg/dL — ABNORMAL HIGH (ref 70–99)
Glucose-Capillary: 155 mg/dL — ABNORMAL HIGH (ref 70–99)

## 2024-09-17 ENCOUNTER — Encounter (HOSPITAL_BASED_OUTPATIENT_CLINIC_OR_DEPARTMENT_OTHER): Admitting: General Surgery

## 2024-09-17 DIAGNOSIS — E11622 Type 2 diabetes mellitus with other skin ulcer: Secondary | ICD-10-CM | POA: Diagnosis not present

## 2024-09-17 LAB — GLUCOSE, CAPILLARY
Glucose-Capillary: 187 mg/dL — ABNORMAL HIGH (ref 70–99)
Glucose-Capillary: 235 mg/dL — ABNORMAL HIGH (ref 70–99)

## 2024-09-18 ENCOUNTER — Encounter (HOSPITAL_BASED_OUTPATIENT_CLINIC_OR_DEPARTMENT_OTHER): Admitting: General Surgery

## 2024-09-18 DIAGNOSIS — E11622 Type 2 diabetes mellitus with other skin ulcer: Secondary | ICD-10-CM | POA: Diagnosis not present

## 2024-09-18 LAB — GLUCOSE, CAPILLARY
Glucose-Capillary: 195 mg/dL — ABNORMAL HIGH (ref 70–99)
Glucose-Capillary: 142 mg/dL — ABNORMAL HIGH (ref 70–99)

## 2024-09-19 ENCOUNTER — Encounter (HOSPITAL_BASED_OUTPATIENT_CLINIC_OR_DEPARTMENT_OTHER): Admitting: General Surgery

## 2024-09-19 DIAGNOSIS — E11622 Type 2 diabetes mellitus with other skin ulcer: Secondary | ICD-10-CM | POA: Diagnosis not present

## 2024-09-19 LAB — GLUCOSE, CAPILLARY
Glucose-Capillary: 315 mg/dL — ABNORMAL HIGH (ref 70–99)
Glucose-Capillary: 268 mg/dL — ABNORMAL HIGH (ref 70–99)

## 2024-09-20 ENCOUNTER — Encounter (HOSPITAL_BASED_OUTPATIENT_CLINIC_OR_DEPARTMENT_OTHER): Admitting: General Surgery

## 2024-09-23 ENCOUNTER — Encounter (HOSPITAL_BASED_OUTPATIENT_CLINIC_OR_DEPARTMENT_OTHER): Admitting: General Surgery

## 2024-09-23 DIAGNOSIS — E11622 Type 2 diabetes mellitus with other skin ulcer: Secondary | ICD-10-CM | POA: Diagnosis not present

## 2024-09-23 LAB — GLUCOSE, CAPILLARY
Glucose-Capillary: 219 mg/dL — ABNORMAL HIGH (ref 70–99)
Glucose-Capillary: 187 mg/dL — ABNORMAL HIGH (ref 70–99)

## 2024-09-24 ENCOUNTER — Encounter (HOSPITAL_BASED_OUTPATIENT_CLINIC_OR_DEPARTMENT_OTHER): Admitting: General Surgery

## 2024-09-24 DIAGNOSIS — E11622 Type 2 diabetes mellitus with other skin ulcer: Secondary | ICD-10-CM | POA: Diagnosis not present

## 2024-09-24 LAB — GLUCOSE, CAPILLARY
Glucose-Capillary: 209 mg/dL — ABNORMAL HIGH (ref 70–99)
Glucose-Capillary: 183 mg/dL — ABNORMAL HIGH (ref 70–99)

## 2024-09-25 ENCOUNTER — Encounter (HOSPITAL_BASED_OUTPATIENT_CLINIC_OR_DEPARTMENT_OTHER): Admitting: General Surgery

## 2024-09-25 DIAGNOSIS — E11622 Type 2 diabetes mellitus with other skin ulcer: Secondary | ICD-10-CM | POA: Diagnosis not present

## 2024-09-25 LAB — GLUCOSE, CAPILLARY
Glucose-Capillary: 127 mg/dL — ABNORMAL HIGH (ref 70–99)
Glucose-Capillary: 233 mg/dL — ABNORMAL HIGH (ref 70–99)
Glucose-Capillary: 177 mg/dL — ABNORMAL HIGH (ref 70–99)

## 2024-09-26 ENCOUNTER — Encounter (HOSPITAL_BASED_OUTPATIENT_CLINIC_OR_DEPARTMENT_OTHER): Admitting: General Surgery

## 2024-09-26 DIAGNOSIS — E11622 Type 2 diabetes mellitus with other skin ulcer: Secondary | ICD-10-CM | POA: Diagnosis not present

## 2024-09-26 LAB — GLUCOSE, CAPILLARY: Glucose-Capillary: 161 mg/dL — ABNORMAL HIGH (ref 70–99)

## 2024-09-27 ENCOUNTER — Encounter (HOSPITAL_BASED_OUTPATIENT_CLINIC_OR_DEPARTMENT_OTHER): Admitting: General Surgery

## 2024-09-27 DIAGNOSIS — E11622 Type 2 diabetes mellitus with other skin ulcer: Secondary | ICD-10-CM | POA: Diagnosis not present

## 2024-09-27 LAB — GLUCOSE, CAPILLARY
Glucose-Capillary: 144 mg/dL — ABNORMAL HIGH (ref 70–99)
Glucose-Capillary: 283 mg/dL — ABNORMAL HIGH (ref 70–99)
Glucose-Capillary: 231 mg/dL — ABNORMAL HIGH (ref 70–99)

## 2024-09-30 ENCOUNTER — Encounter (HOSPITAL_BASED_OUTPATIENT_CLINIC_OR_DEPARTMENT_OTHER): Admitting: General Surgery

## 2024-09-30 DIAGNOSIS — E11622 Type 2 diabetes mellitus with other skin ulcer: Secondary | ICD-10-CM | POA: Diagnosis not present

## 2024-09-30 LAB — GLUCOSE, CAPILLARY
Glucose-Capillary: 284 mg/dL — ABNORMAL HIGH (ref 70–99)
Glucose-Capillary: 198 mg/dL — ABNORMAL HIGH (ref 70–99)

## 2024-10-01 ENCOUNTER — Encounter (HOSPITAL_BASED_OUTPATIENT_CLINIC_OR_DEPARTMENT_OTHER): Admitting: General Surgery

## 2024-10-01 ENCOUNTER — Other Ambulatory Visit: Payer: Self-pay | Admitting: Gastroenterology

## 2024-10-01 DIAGNOSIS — E11622 Type 2 diabetes mellitus with other skin ulcer: Secondary | ICD-10-CM | POA: Diagnosis not present

## 2024-10-01 LAB — GLUCOSE, CAPILLARY: Glucose-Capillary: 204 mg/dL — ABNORMAL HIGH (ref 70–99)

## 2024-10-02 ENCOUNTER — Encounter (HOSPITAL_BASED_OUTPATIENT_CLINIC_OR_DEPARTMENT_OTHER): Admitting: General Surgery

## 2024-10-02 DIAGNOSIS — E11622 Type 2 diabetes mellitus with other skin ulcer: Secondary | ICD-10-CM | POA: Diagnosis not present

## 2024-10-02 LAB — GLUCOSE, CAPILLARY
Glucose-Capillary: 199 mg/dL — ABNORMAL HIGH (ref 70–99)
Glucose-Capillary: 313 mg/dL — ABNORMAL HIGH (ref 70–99)
Glucose-Capillary: 264 mg/dL — ABNORMAL HIGH (ref 70–99)

## 2024-10-03 ENCOUNTER — Encounter (HOSPITAL_BASED_OUTPATIENT_CLINIC_OR_DEPARTMENT_OTHER): Admitting: General Surgery

## 2024-10-03 DIAGNOSIS — E11622 Type 2 diabetes mellitus with other skin ulcer: Secondary | ICD-10-CM | POA: Diagnosis not present

## 2024-10-03 LAB — GLUCOSE, CAPILLARY: Glucose-Capillary: 207 mg/dL — ABNORMAL HIGH (ref 70–99)

## 2024-10-04 ENCOUNTER — Encounter (HOSPITAL_BASED_OUTPATIENT_CLINIC_OR_DEPARTMENT_OTHER): Admitting: General Surgery

## 2024-10-04 DIAGNOSIS — E11622 Type 2 diabetes mellitus with other skin ulcer: Secondary | ICD-10-CM | POA: Diagnosis not present

## 2024-10-04 LAB — GLUCOSE, CAPILLARY
Glucose-Capillary: 165 mg/dL — ABNORMAL HIGH (ref 70–99)
Glucose-Capillary: 249 mg/dL — ABNORMAL HIGH (ref 70–99)
Glucose-Capillary: 229 mg/dL — ABNORMAL HIGH (ref 70–99)

## 2024-10-07 ENCOUNTER — Encounter (HOSPITAL_BASED_OUTPATIENT_CLINIC_OR_DEPARTMENT_OTHER): Admitting: General Surgery

## 2024-10-08 ENCOUNTER — Encounter (HOSPITAL_BASED_OUTPATIENT_CLINIC_OR_DEPARTMENT_OTHER): Admitting: General Surgery

## 2024-10-09 ENCOUNTER — Encounter (HOSPITAL_BASED_OUTPATIENT_CLINIC_OR_DEPARTMENT_OTHER): Admitting: General Surgery

## 2024-10-10 ENCOUNTER — Encounter (HOSPITAL_BASED_OUTPATIENT_CLINIC_OR_DEPARTMENT_OTHER): Admitting: General Surgery

## 2024-10-11 ENCOUNTER — Encounter (HOSPITAL_BASED_OUTPATIENT_CLINIC_OR_DEPARTMENT_OTHER): Admitting: General Surgery

## 2024-10-14 ENCOUNTER — Encounter (HOSPITAL_BASED_OUTPATIENT_CLINIC_OR_DEPARTMENT_OTHER): Admitting: General Surgery

## 2024-10-15 ENCOUNTER — Encounter (HOSPITAL_BASED_OUTPATIENT_CLINIC_OR_DEPARTMENT_OTHER): Admitting: General Surgery

## 2024-10-16 ENCOUNTER — Encounter (HOSPITAL_BASED_OUTPATIENT_CLINIC_OR_DEPARTMENT_OTHER): Admitting: General Surgery

## 2024-10-17 ENCOUNTER — Encounter (HOSPITAL_BASED_OUTPATIENT_CLINIC_OR_DEPARTMENT_OTHER): Admitting: General Surgery

## 2024-10-18 ENCOUNTER — Encounter (HOSPITAL_BASED_OUTPATIENT_CLINIC_OR_DEPARTMENT_OTHER): Admitting: General Surgery

## 2024-10-21 ENCOUNTER — Encounter (HOSPITAL_BASED_OUTPATIENT_CLINIC_OR_DEPARTMENT_OTHER): Admitting: General Surgery

## 2024-10-22 ENCOUNTER — Encounter (HOSPITAL_BASED_OUTPATIENT_CLINIC_OR_DEPARTMENT_OTHER): Admitting: General Surgery

## 2024-10-23 ENCOUNTER — Encounter (HOSPITAL_BASED_OUTPATIENT_CLINIC_OR_DEPARTMENT_OTHER): Admitting: General Surgery

## 2024-10-23 ENCOUNTER — Ambulatory Visit: Admitting: "Endocrinology

## 2024-10-24 ENCOUNTER — Encounter (HOSPITAL_BASED_OUTPATIENT_CLINIC_OR_DEPARTMENT_OTHER): Admitting: General Surgery

## 2024-10-25 ENCOUNTER — Encounter (HOSPITAL_BASED_OUTPATIENT_CLINIC_OR_DEPARTMENT_OTHER): Admitting: General Surgery

## 2024-10-28 ENCOUNTER — Encounter (HOSPITAL_BASED_OUTPATIENT_CLINIC_OR_DEPARTMENT_OTHER): Admitting: General Surgery

## 2024-10-29 ENCOUNTER — Encounter (HOSPITAL_BASED_OUTPATIENT_CLINIC_OR_DEPARTMENT_OTHER): Admitting: General Surgery

## 2024-10-30 ENCOUNTER — Encounter (HOSPITAL_BASED_OUTPATIENT_CLINIC_OR_DEPARTMENT_OTHER): Admitting: General Surgery

## 2024-10-31 ENCOUNTER — Encounter (HOSPITAL_BASED_OUTPATIENT_CLINIC_OR_DEPARTMENT_OTHER): Admitting: General Surgery

## 2024-11-01 ENCOUNTER — Encounter (HOSPITAL_BASED_OUTPATIENT_CLINIC_OR_DEPARTMENT_OTHER): Admitting: General Surgery

## 2024-11-04 ENCOUNTER — Encounter (HOSPITAL_BASED_OUTPATIENT_CLINIC_OR_DEPARTMENT_OTHER): Admitting: General Surgery

## 2024-11-05 ENCOUNTER — Encounter (HOSPITAL_BASED_OUTPATIENT_CLINIC_OR_DEPARTMENT_OTHER): Admitting: General Surgery

## 2024-11-06 ENCOUNTER — Encounter (HOSPITAL_BASED_OUTPATIENT_CLINIC_OR_DEPARTMENT_OTHER): Admitting: General Surgery

## 2024-11-07 ENCOUNTER — Encounter (HOSPITAL_BASED_OUTPATIENT_CLINIC_OR_DEPARTMENT_OTHER): Admitting: General Surgery
